# Patient Record
Sex: Female | Born: 1975
Health system: Southern US, Community
[De-identification: ages and names within clinical notes are randomized; demographics above are authoritative.]

## PROBLEM LIST (undated history)

## (undated) DIAGNOSIS — K7460 Unspecified cirrhosis of liver: Secondary | ICD-10-CM

## (undated) DIAGNOSIS — M1990 Unspecified osteoarthritis, unspecified site: Secondary | ICD-10-CM

## (undated) DIAGNOSIS — IMO0001 Reserved for inherently not codable concepts without codable children: Secondary | ICD-10-CM

## (undated) DIAGNOSIS — R748 Abnormal levels of other serum enzymes: Secondary | ICD-10-CM

## (undated) DIAGNOSIS — K219 Gastro-esophageal reflux disease without esophagitis: Secondary | ICD-10-CM

## (undated) DIAGNOSIS — R519 Headache, unspecified: Secondary | ICD-10-CM

## (undated) DIAGNOSIS — D696 Thrombocytopenia, unspecified: Secondary | ICD-10-CM

## (undated) DIAGNOSIS — E119 Type 2 diabetes mellitus without complications: Secondary | ICD-10-CM

## (undated) DIAGNOSIS — I10 Essential (primary) hypertension: Secondary | ICD-10-CM

## (undated) DIAGNOSIS — K760 Fatty (change of) liver, not elsewhere classified: Secondary | ICD-10-CM

## (undated) DIAGNOSIS — G629 Polyneuropathy, unspecified: Secondary | ICD-10-CM

## (undated) DIAGNOSIS — D649 Anemia, unspecified: Secondary | ICD-10-CM

## (undated) DIAGNOSIS — Z8719 Personal history of other diseases of the digestive system: Secondary | ICD-10-CM

## (undated) HISTORY — DX: Polyneuropathy, unspecified: G62.9

## (undated) HISTORY — PX: HERNIA REPAIR: SHX51

## (undated) HISTORY — DX: Unspecified osteoarthritis, unspecified site: M19.90

## (undated) HISTORY — DX: Essential (primary) hypertension: I10

## (undated) HISTORY — DX: Type 2 diabetes mellitus without complications: E11.9

## (undated) HISTORY — DX: Unspecified cirrhosis of liver: K74.60

## (undated) HISTORY — DX: Gastro-esophageal reflux disease without esophagitis: K21.9

## (undated) HISTORY — DX: Anemia, unspecified: D64.9

---
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
## 1997-12-11 ENCOUNTER — Inpatient Hospital Stay (HOSPITAL_COMMUNITY): Admission: AD | Admit: 1997-12-11 | Discharge: 1997-12-11 | Payer: Self-pay | Admitting: *Deleted

## 1997-12-13 ENCOUNTER — Inpatient Hospital Stay (HOSPITAL_COMMUNITY): Admission: AD | Admit: 1997-12-13 | Discharge: 1997-12-15 | Payer: Self-pay | Admitting: *Deleted

## 1998-09-16 ENCOUNTER — Emergency Department (HOSPITAL_COMMUNITY): Admission: EM | Admit: 1998-09-16 | Discharge: 1998-09-16 | Payer: Self-pay | Admitting: Emergency Medicine

## 1999-10-10 ENCOUNTER — Other Ambulatory Visit: Admission: RE | Admit: 1999-10-10 | Discharge: 1999-10-10 | Payer: Self-pay | Admitting: *Deleted

## 2000-04-26 ENCOUNTER — Inpatient Hospital Stay (HOSPITAL_COMMUNITY): Admission: AD | Admit: 2000-04-26 | Discharge: 2000-04-26 | Payer: Self-pay | Admitting: Obstetrics & Gynecology

## 2000-07-11 ENCOUNTER — Inpatient Hospital Stay (HOSPITAL_COMMUNITY): Admission: AD | Admit: 2000-07-11 | Discharge: 2000-07-11 | Payer: Self-pay | Admitting: *Deleted

## 2000-07-22 ENCOUNTER — Inpatient Hospital Stay (HOSPITAL_COMMUNITY): Admission: AD | Admit: 2000-07-22 | Discharge: 2000-07-25 | Payer: Self-pay | Admitting: Obstetrics and Gynecology

## 2000-07-22 ENCOUNTER — Encounter (INDEPENDENT_AMBULATORY_CARE_PROVIDER_SITE_OTHER): Payer: Self-pay | Admitting: Specialist

## 2000-07-25 HISTORY — PX: TUBAL LIGATION: SHX77

## 2000-08-26 ENCOUNTER — Other Ambulatory Visit: Admission: RE | Admit: 2000-08-26 | Discharge: 2000-08-26 | Payer: Self-pay | Admitting: *Deleted

## 2001-10-18 ENCOUNTER — Emergency Department (HOSPITAL_COMMUNITY): Admission: EM | Admit: 2001-10-18 | Discharge: 2001-10-18 | Payer: Self-pay | Admitting: *Deleted

## 2002-04-16 ENCOUNTER — Emergency Department (HOSPITAL_COMMUNITY): Admission: EM | Admit: 2002-04-16 | Discharge: 2002-04-16 | Payer: Self-pay

## 2003-05-20 ENCOUNTER — Encounter: Payer: Self-pay | Admitting: Emergency Medicine

## 2003-05-20 ENCOUNTER — Emergency Department (HOSPITAL_COMMUNITY): Admission: EM | Admit: 2003-05-20 | Discharge: 2003-05-20 | Payer: Self-pay | Admitting: Emergency Medicine

## 2003-07-20 ENCOUNTER — Other Ambulatory Visit: Admission: RE | Admit: 2003-07-20 | Discharge: 2003-07-20 | Payer: Self-pay | Admitting: Gynecology

## 2003-10-20 ENCOUNTER — Emergency Department (HOSPITAL_COMMUNITY): Admission: EM | Admit: 2003-10-20 | Discharge: 2003-10-21 | Payer: Self-pay | Admitting: Emergency Medicine

## 2003-11-24 ENCOUNTER — Emergency Department (HOSPITAL_COMMUNITY): Admission: EM | Admit: 2003-11-24 | Discharge: 2003-11-24 | Payer: Self-pay | Admitting: Emergency Medicine

## 2004-12-06 ENCOUNTER — Ambulatory Visit (HOSPITAL_COMMUNITY): Admission: RE | Admit: 2004-12-06 | Discharge: 2004-12-06 | Payer: Self-pay | Admitting: Internal Medicine

## 2005-01-23 IMAGING — CR DG CHEST 2V
2 series · 2 of 2 positions shown · non-contrast
Comparison: none

CLINICAL DATA: Cough.  Chest pain.  
 PA AND LATERAL CHEST:

[w chest pa]
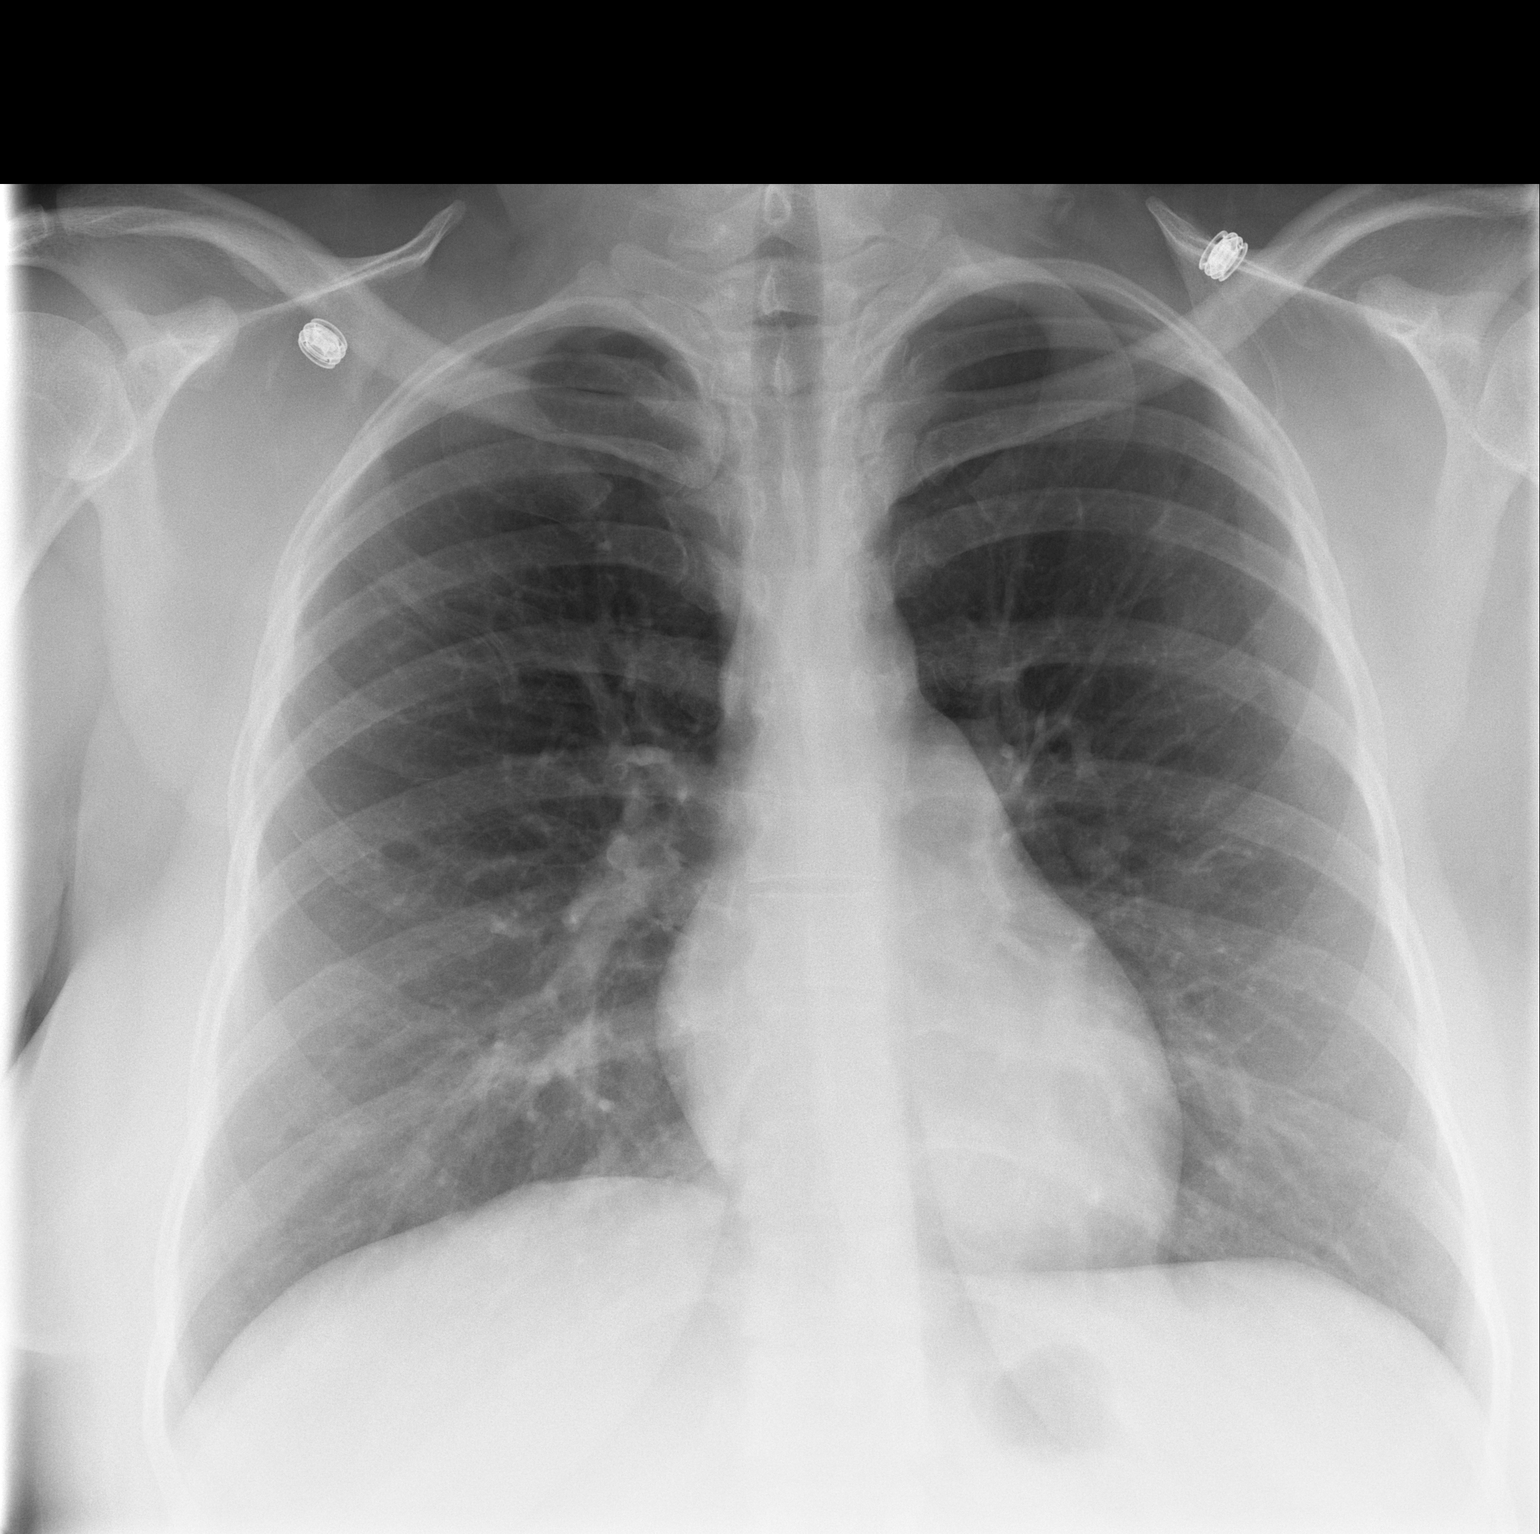

[w chest lat]
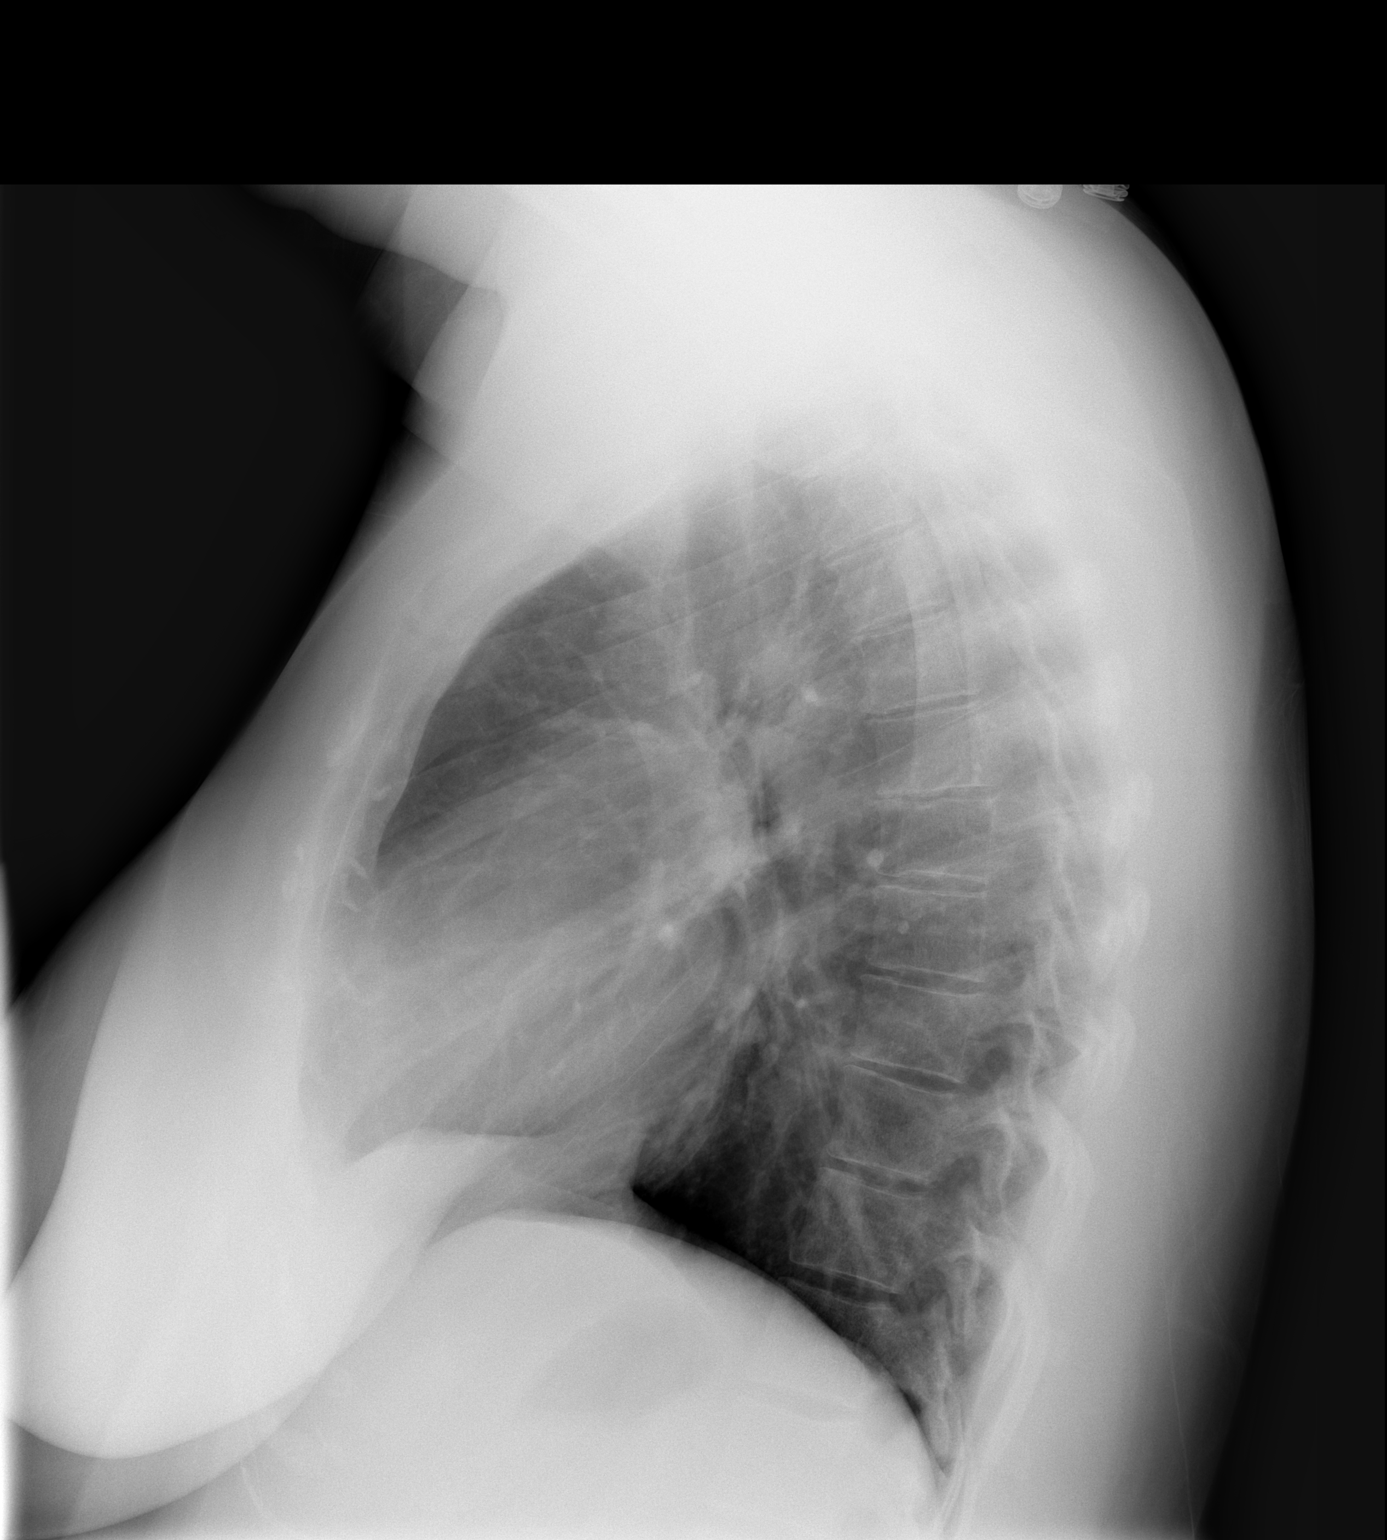

[2 of 2 positions shown; findings below may reference images not displayed]

FINDINGS: Lungs are clear.  No pleural effusion.  Heart size is normal.
IMPRESSION: No acute disease.

## 2005-03-05 ENCOUNTER — Emergency Department (HOSPITAL_COMMUNITY): Admission: EM | Admit: 2005-03-05 | Discharge: 2005-03-05 | Payer: Self-pay | Admitting: Emergency Medicine

## 2006-04-30 ENCOUNTER — Emergency Department (HOSPITAL_COMMUNITY): Admission: EM | Admit: 2006-04-30 | Discharge: 2006-04-30 | Payer: Self-pay | Admitting: Emergency Medicine

## 2006-08-12 ENCOUNTER — Emergency Department (HOSPITAL_COMMUNITY): Admission: EM | Admit: 2006-08-12 | Discharge: 2006-08-13 | Payer: Self-pay | Admitting: Emergency Medicine

## 2006-08-12 IMAGING — US US ABDOMEN COMPLETE
1 series · 14 of 25 positions shown · non-contrast
Comparison: None.

ABDOMEN ULTRASOUND:

CLINICAL DATA: Pain
TECHNIQUE: Complete abdominal ultrasound examination was performed including
evaluation of the liver, gallbladder, bile ducts, pancreas, kidneys, spleen,
IVC, and abdominal aorta.

[Series 1: unknown · 0.35mm/px · 14 of 63 slices shown]
[im 1/63]
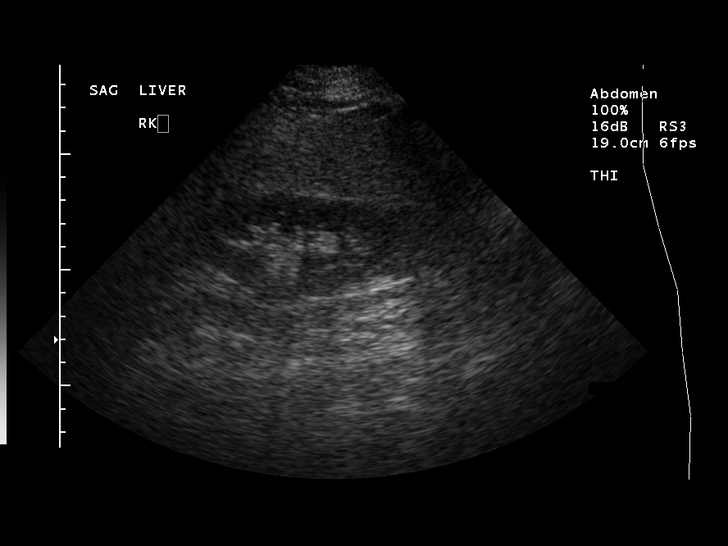
[im 6/63]
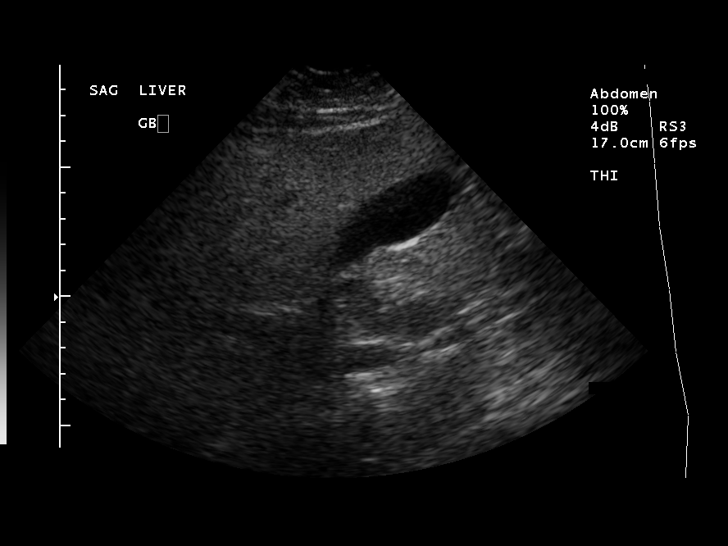
[im 11/63]
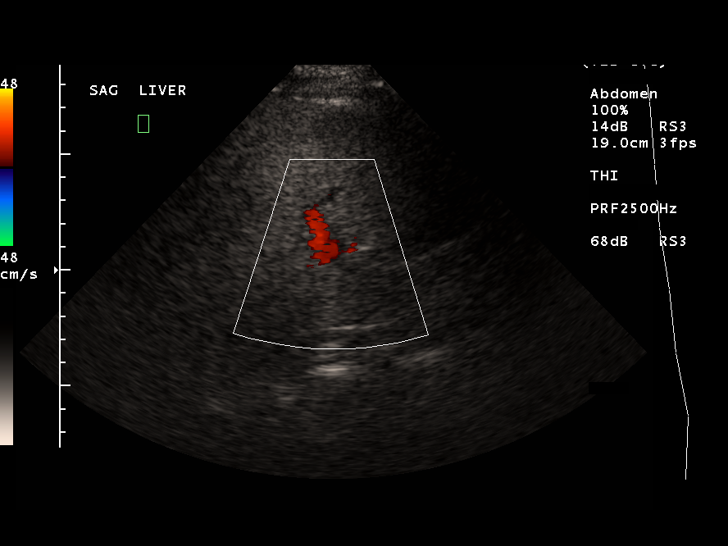
[im 16/63]
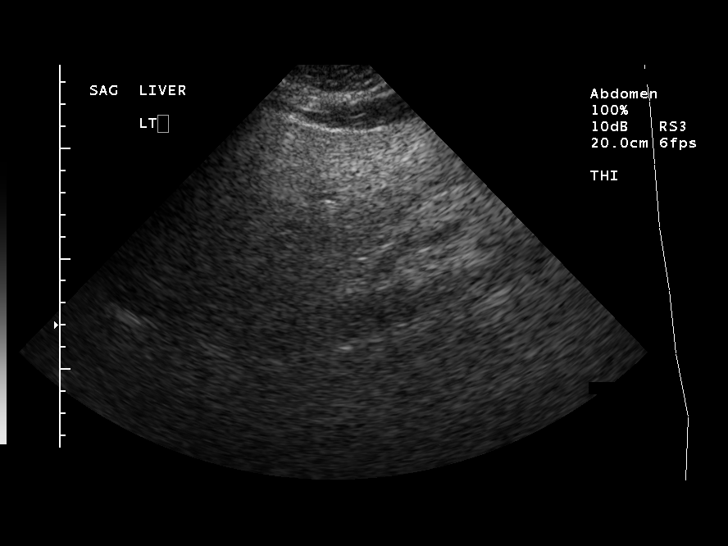
[im 21/63]
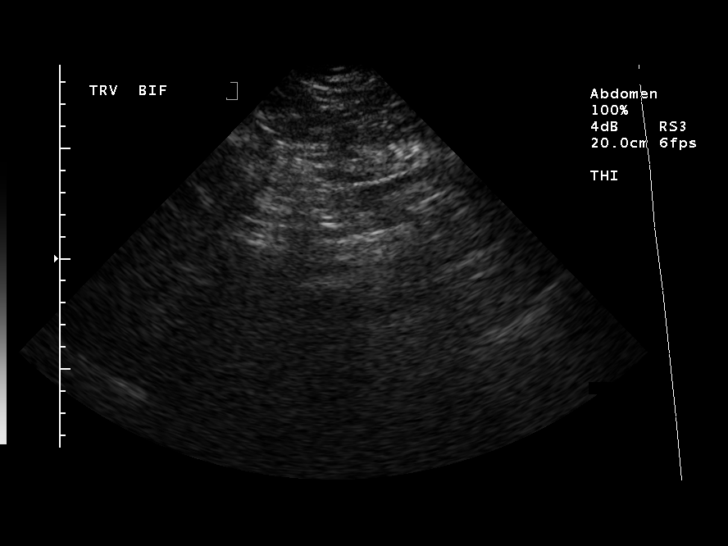
[im 24/63]
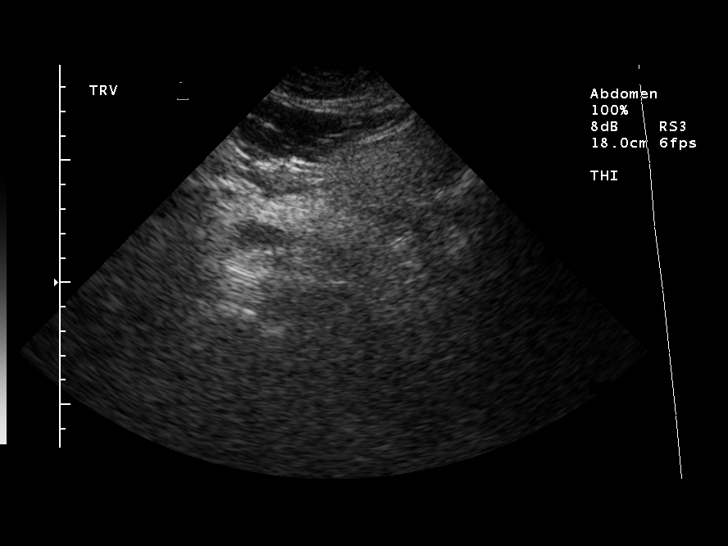
[im 29/63]
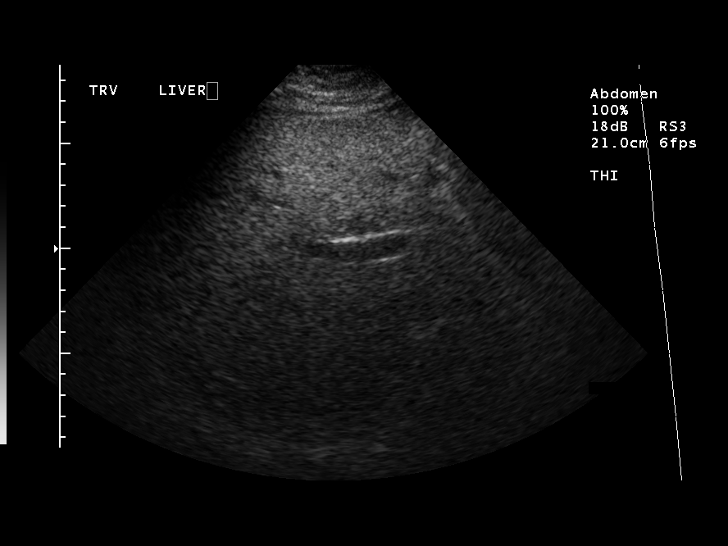
[im 34/63]
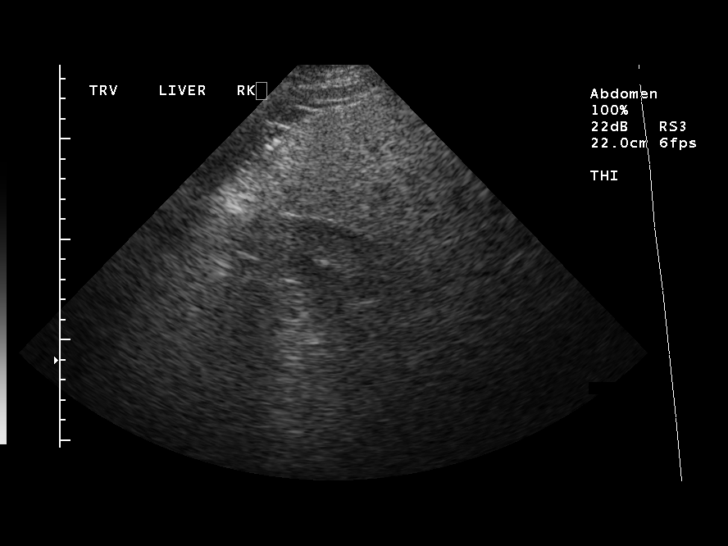
[im 39/63]
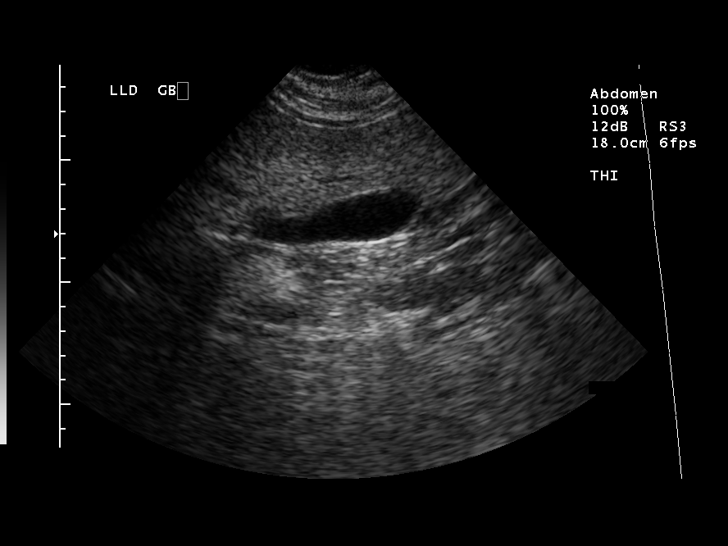
[im 42/63]
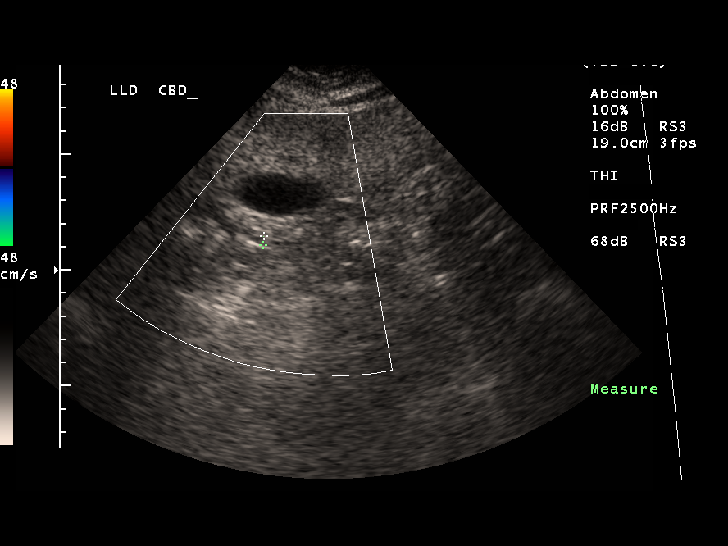
[im 47/63]
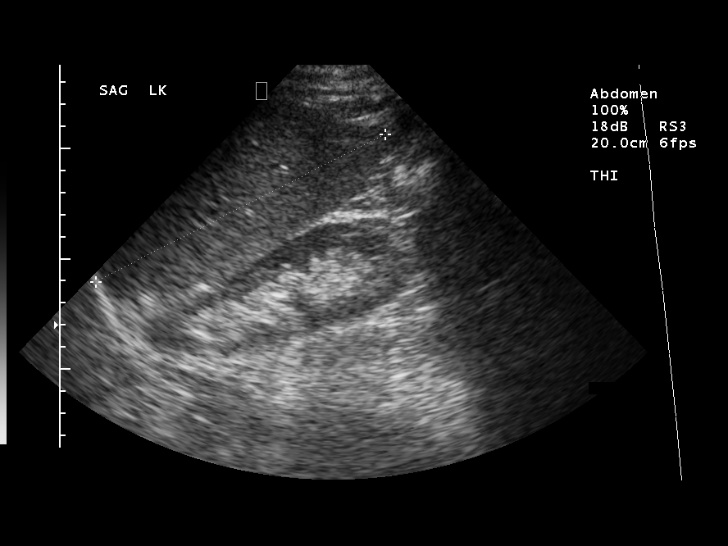
[im 52/63]
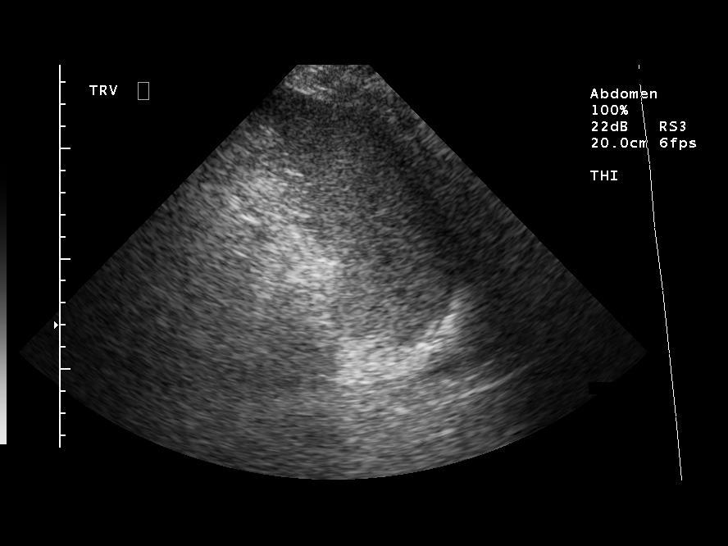
[im 57/63]
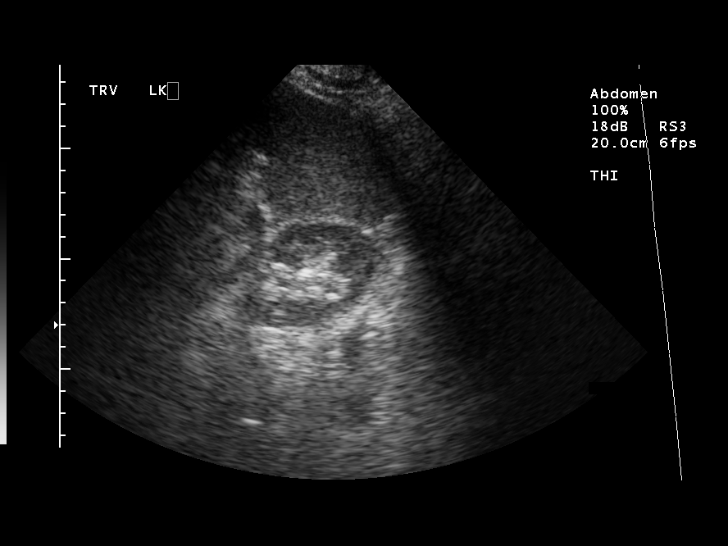
[im 63/63]
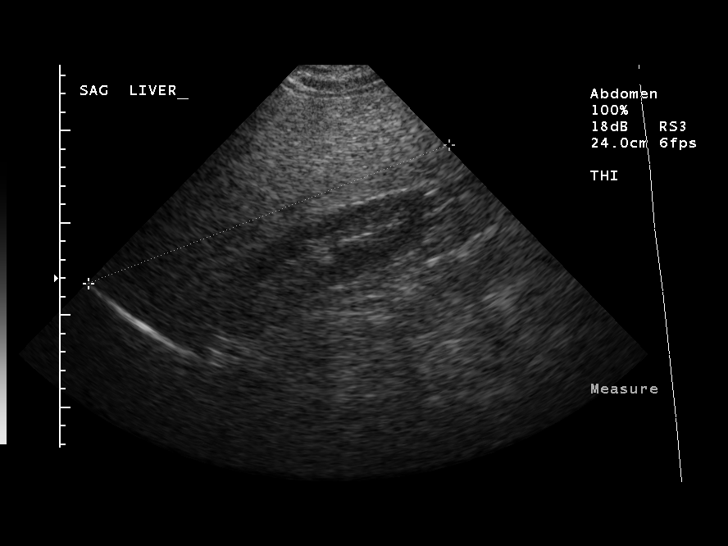

[14 of 25 positions shown; findings below may reference images not displayed]

FINDINGS: Gallbladder: Normal. Specifically, there is no evidence for gallstones,
gallbladder wall thickening or pericholecystic fluid.

Common Bile Duct:  Nondilated

Liver:  Diffusely coarsened echotexture consistent with fatty infiltration. The
liver measures greater than 21 cm in craniocaudal length.

Inferior Vena Cava:  Normal

Pancreas:  Not well seen secondary to overlying bowel gas

Spleen:  Normal

Right Kidney:  Normal

Left Kidney:  Normal

Aorta:  No aneurysm
IMPRESSION: Enlarged, fatty liver. Otherwise unremarkable exam.

## 2007-03-03 ENCOUNTER — Emergency Department (HOSPITAL_COMMUNITY): Admission: EM | Admit: 2007-03-03 | Discharge: 2007-03-04 | Payer: Self-pay | Admitting: Emergency Medicine

## 2007-03-03 IMAGING — CR DG WRIST COMPLETE 3+V*L*
4 series · 4 of 4 positions shown · non-contrast
Comparison: none

CLINICAL DATA: Fall, wrist pain

LEFT WRIST - 4  VIEW:

[x wrist pa left]
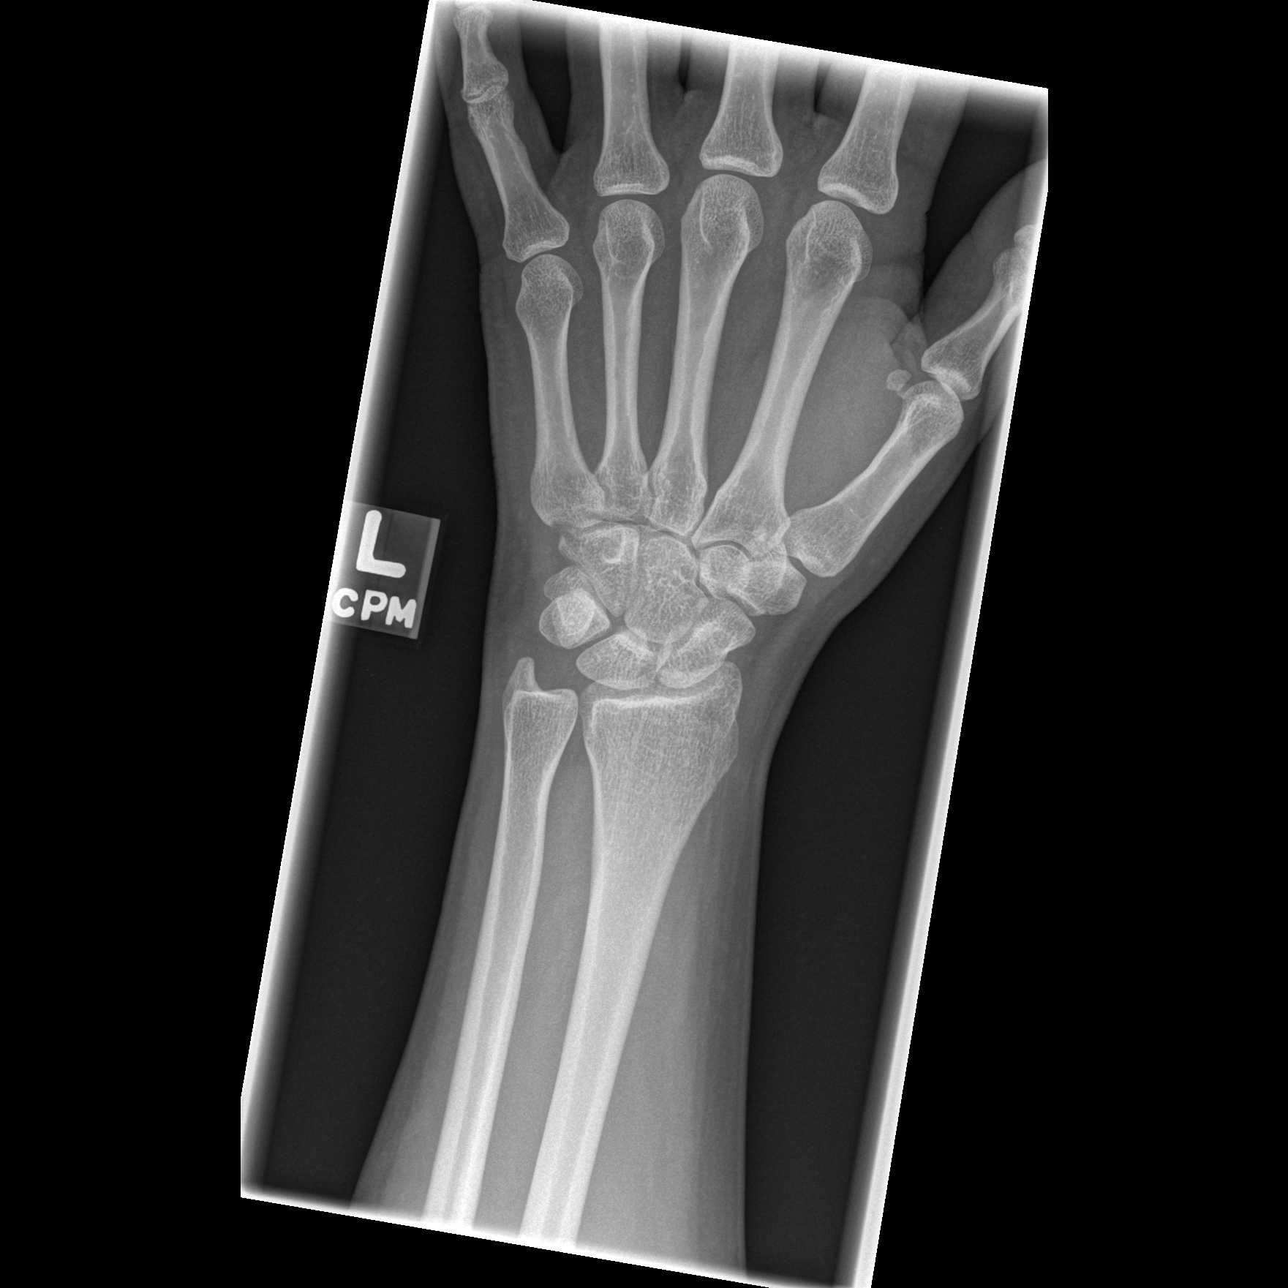

[x wrist obl left]
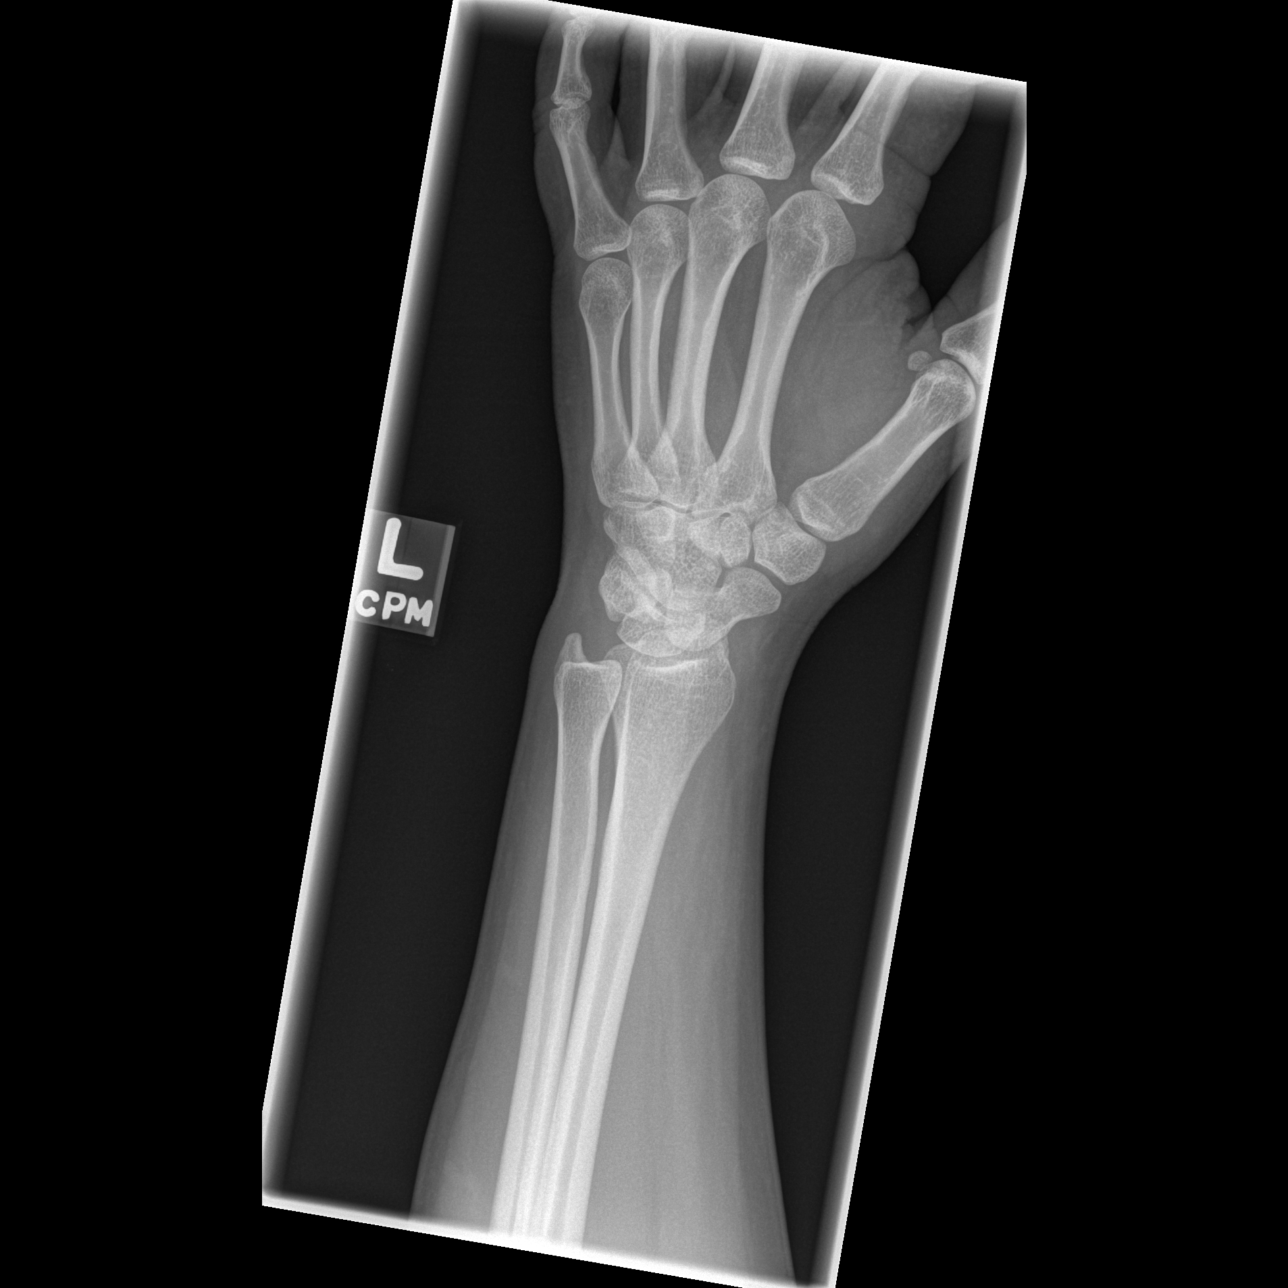

[x wrist lat left]
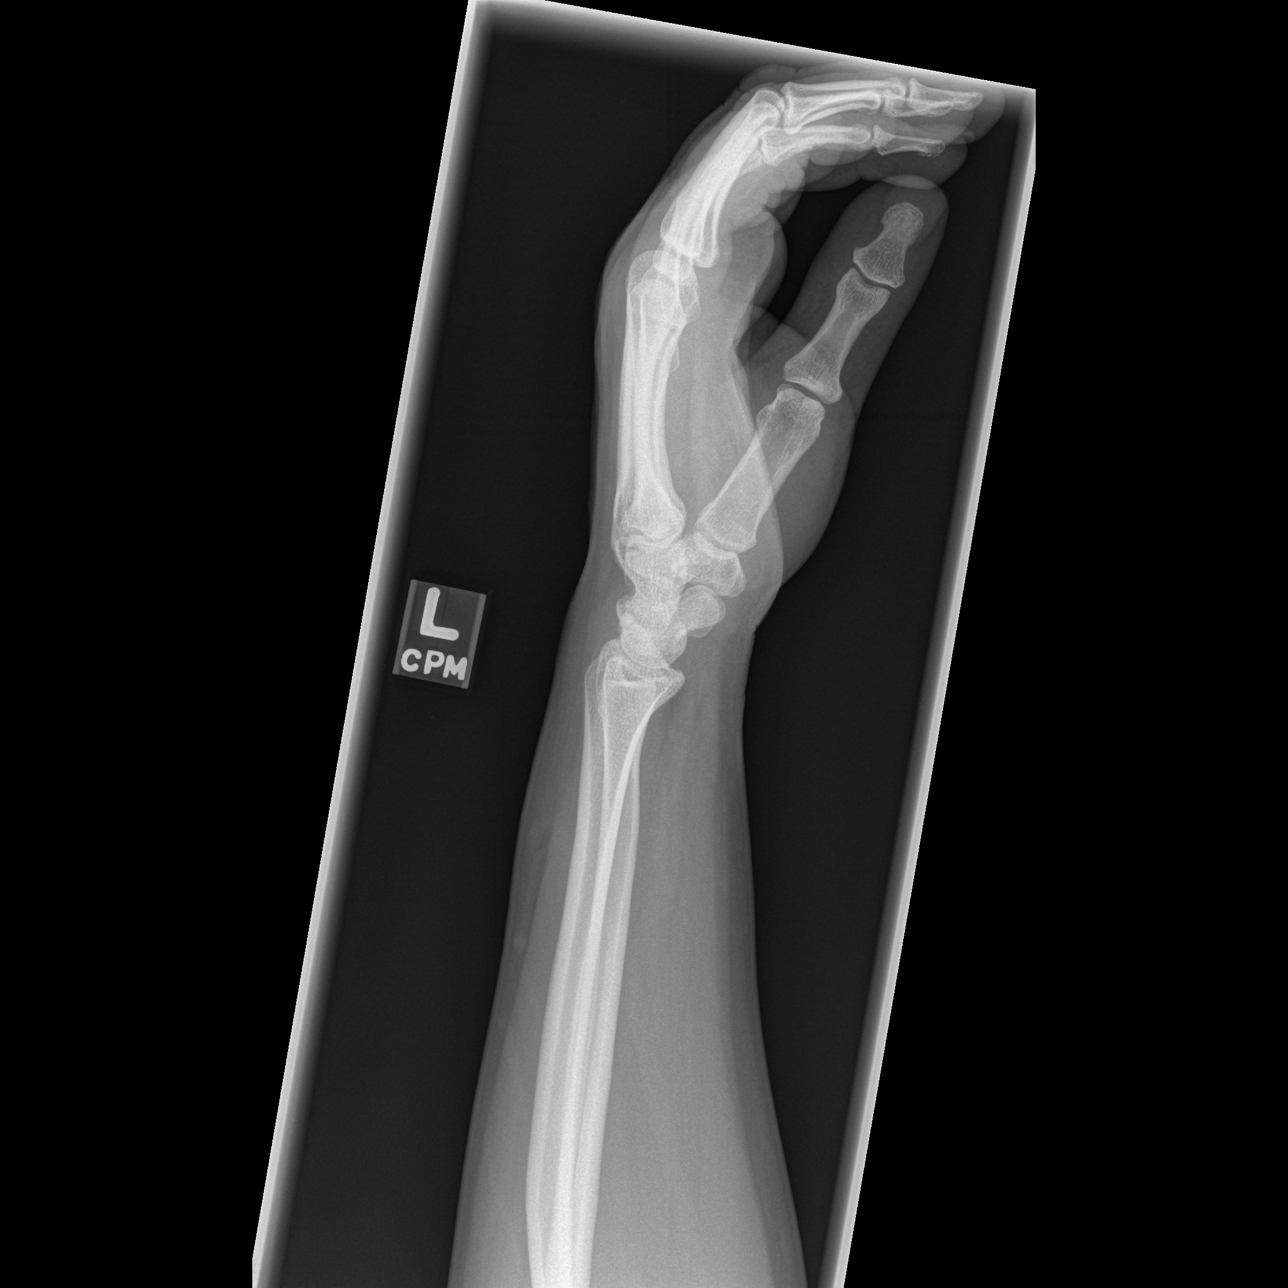

[x wrist navicular]
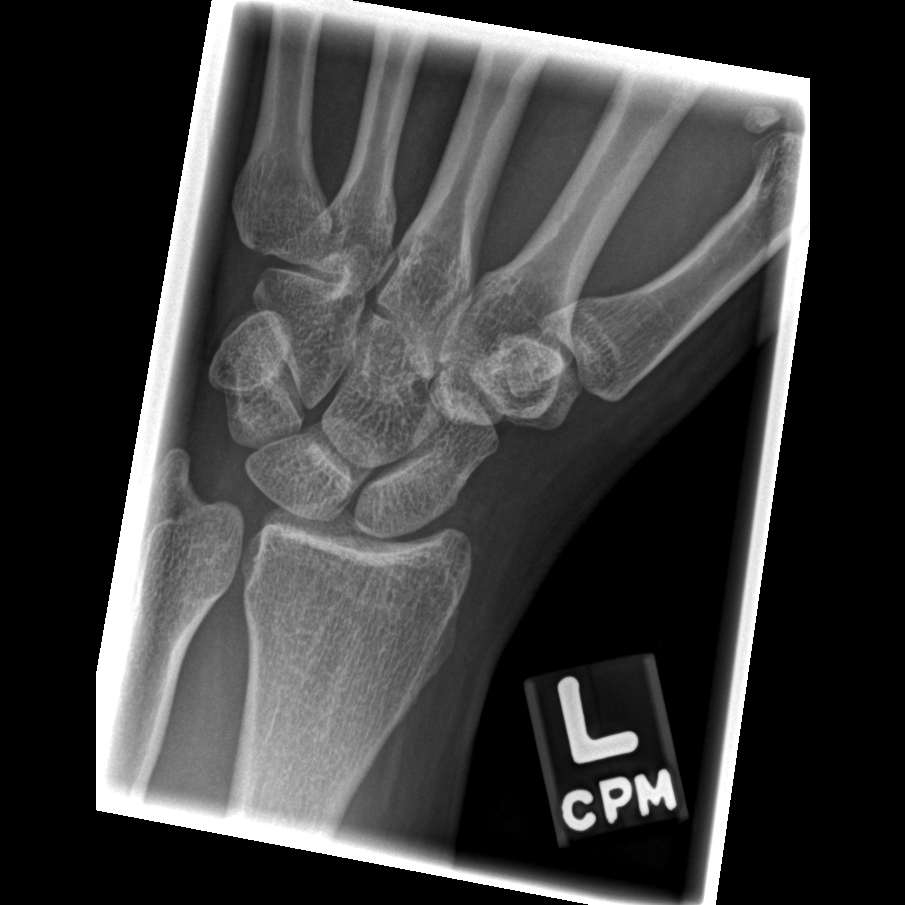

[4 of 4 positions shown; findings below may reference images not displayed]

FINDINGS: There is no evidence of fracture or dislocation.  There is no
evidence of arthropathy or other focal bone abnormality.  Soft tissues are
unremarkable.
IMPRESSION: Negative.

## 2007-03-03 IMAGING — CR DG CHEST 2V
2 series · 2 of 2 positions shown · non-contrast
Comparison: [DATE]

CLINICAL DATA: Fall, chest pain

CHEST - 2 VIEW:

[w chest pa]
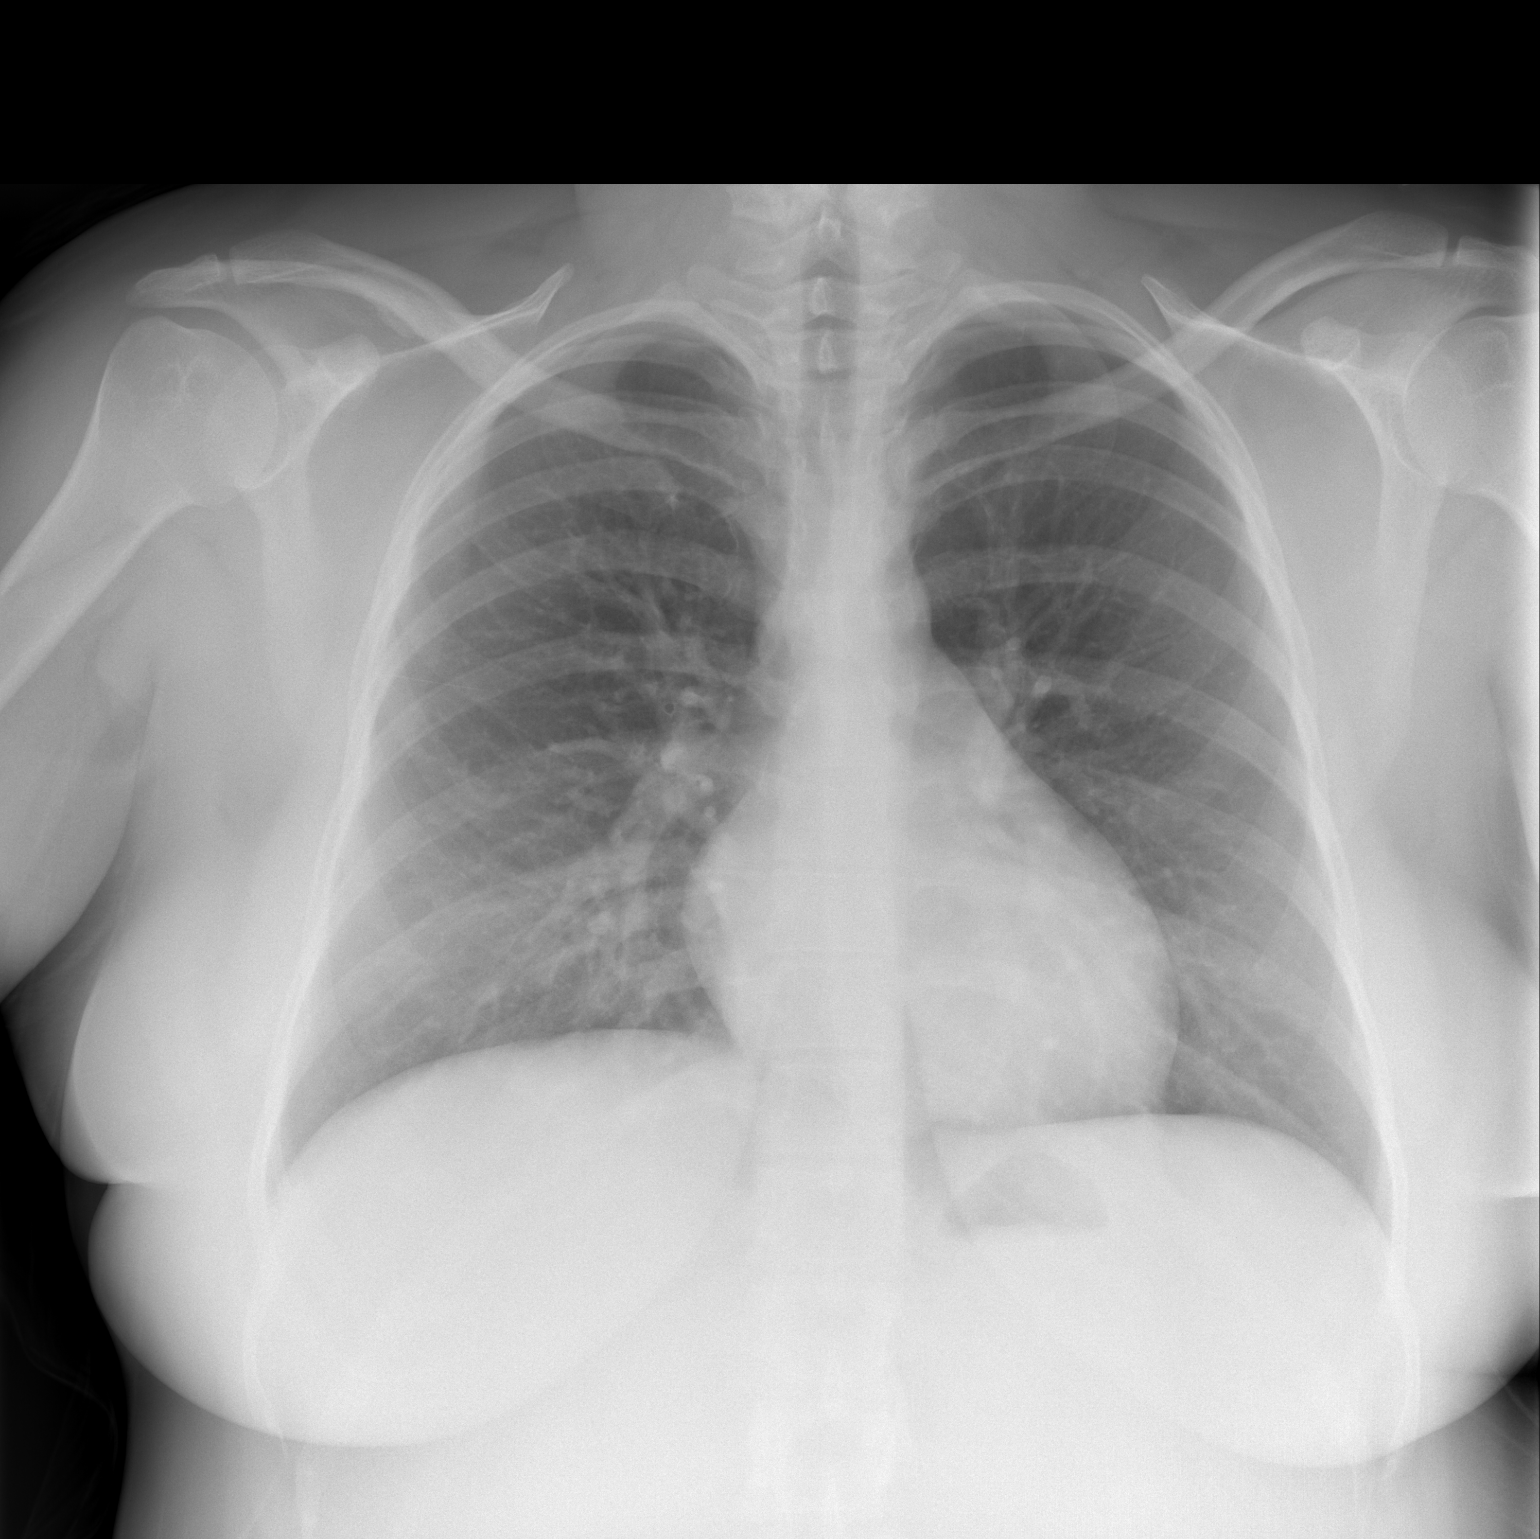

[w chest lat]
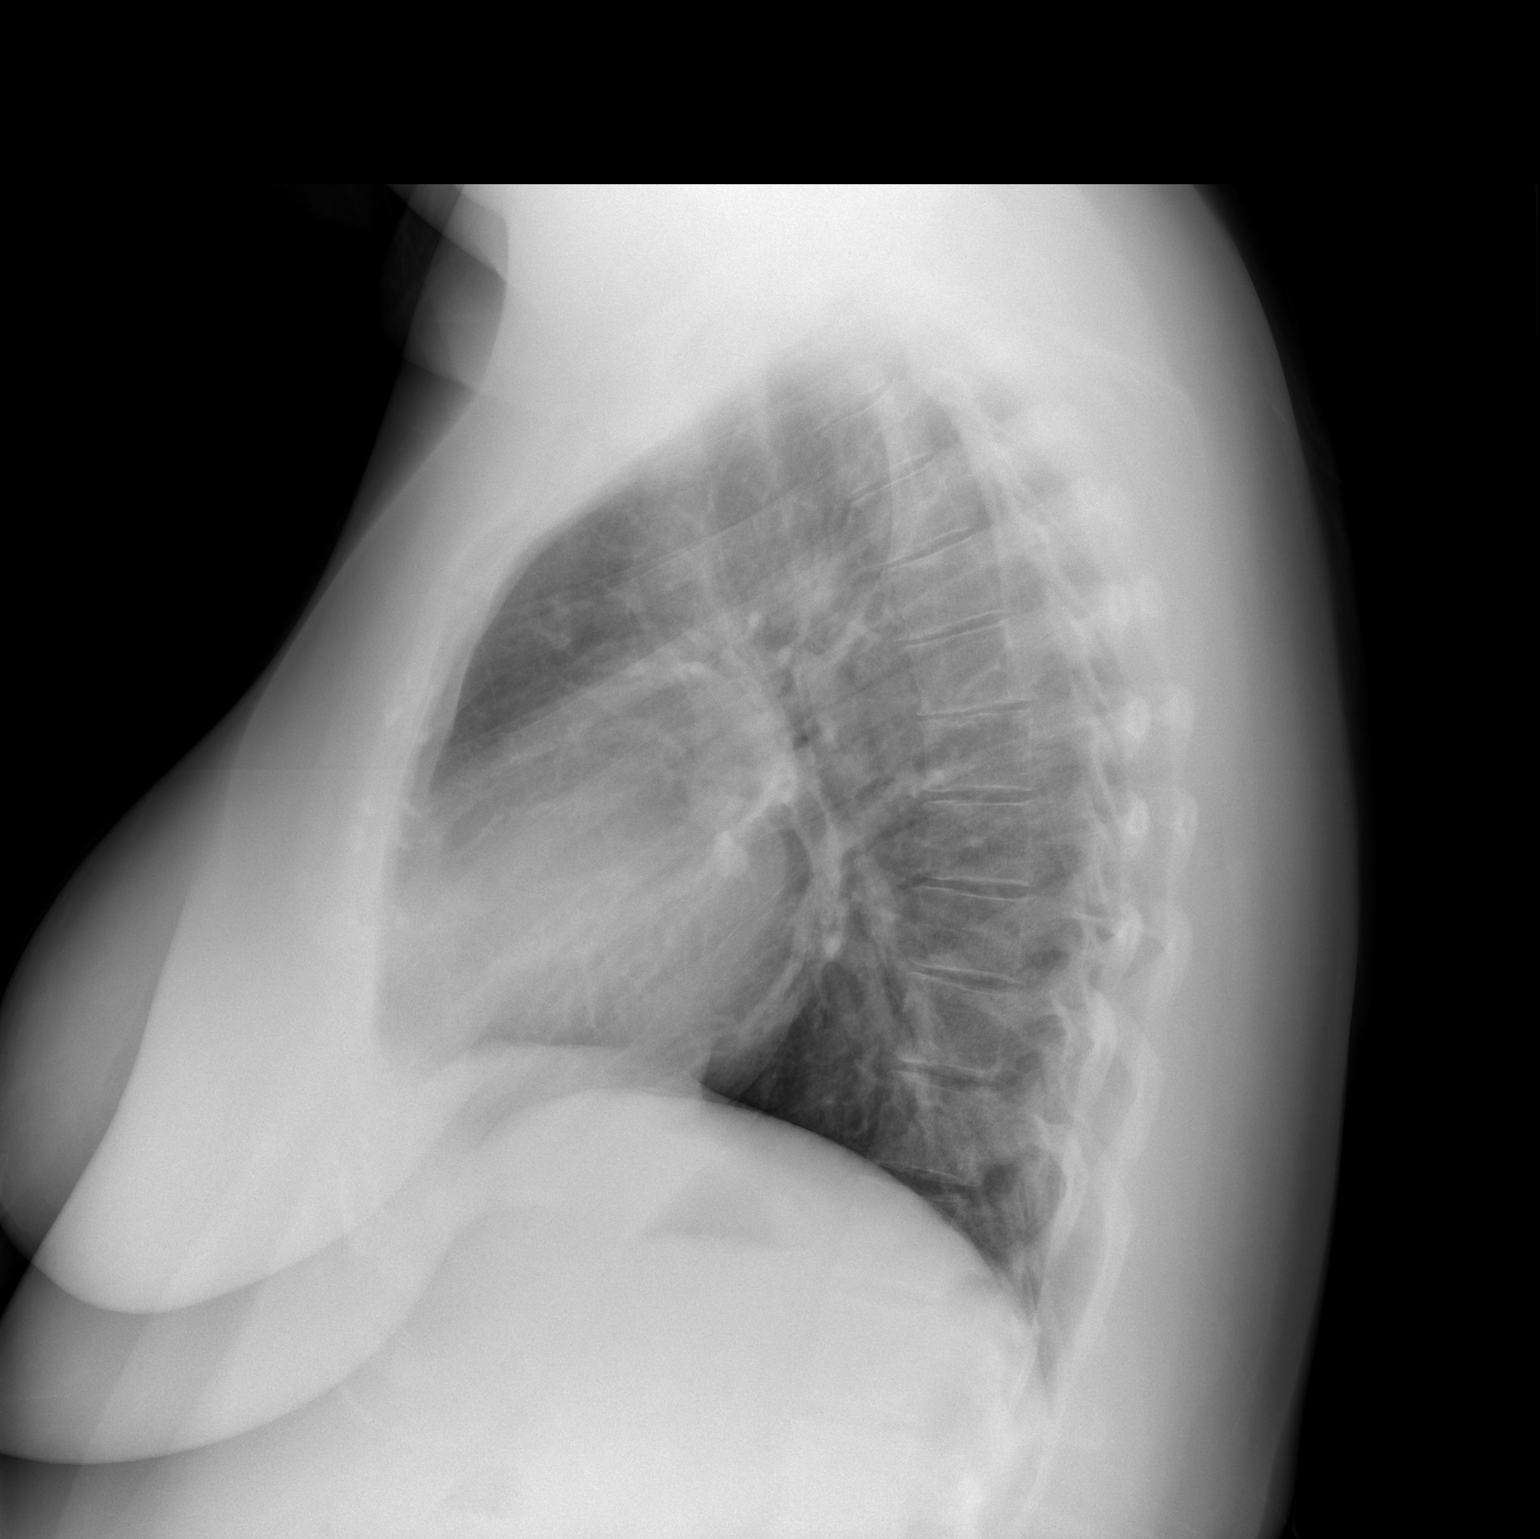

[2 of 2 positions shown; findings below may reference images not displayed]

FINDINGS: The heart size and mediastinal contours are within normal limits. 
Mild peribronchial thickening noted. No focal opacities. The visualized skeletal
structures are unremarkable.
IMPRESSION: Mild bronchitic changes.

## 2007-03-04 IMAGING — CR DG CERVICAL SPINE COMPLETE 4+V
6 series · 6 of 6 positions shown · non-contrast
Comparison: none

CLINICAL DATA: Fall, neck pain

CERVICAL SPINE - 5  VIEW:

[w c-spine lat *]
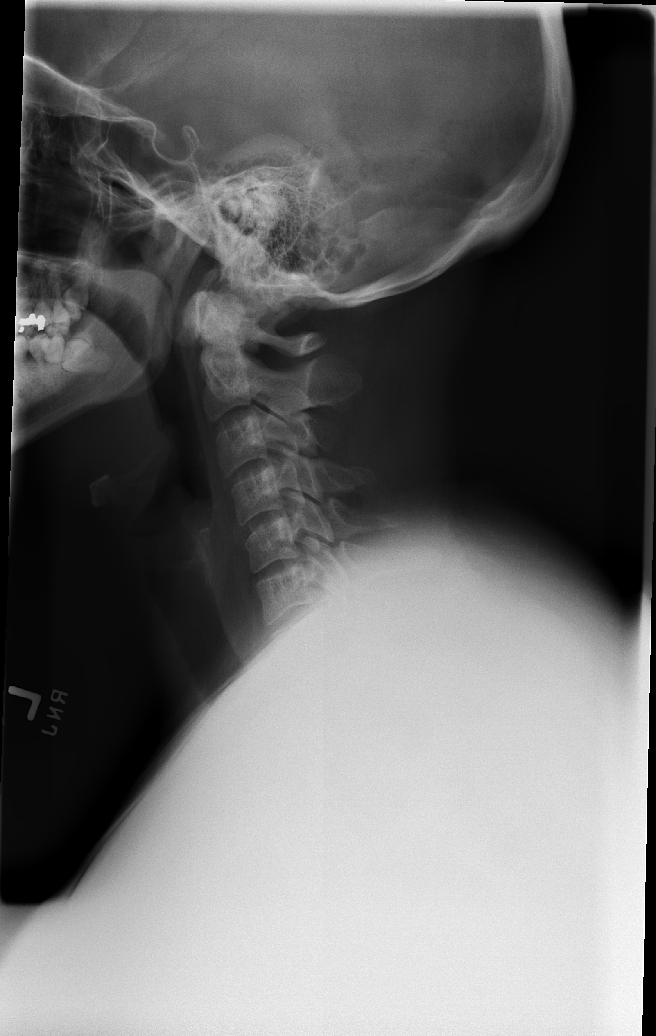

[w swimmers view *]
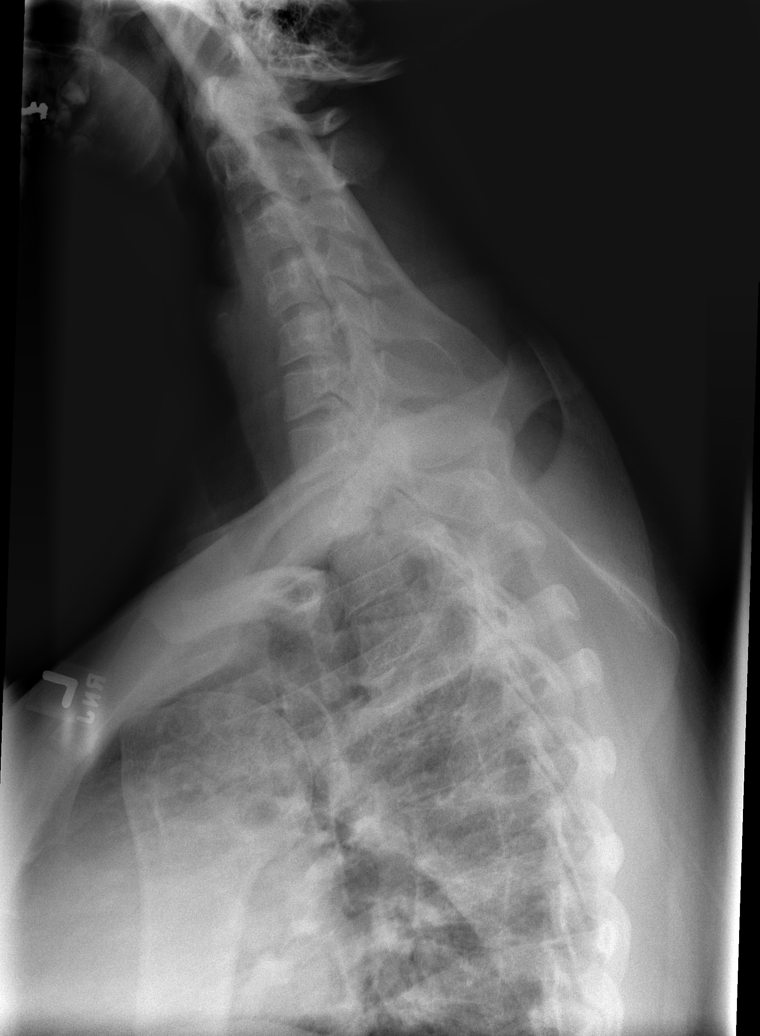

[w c-spine oblique *]
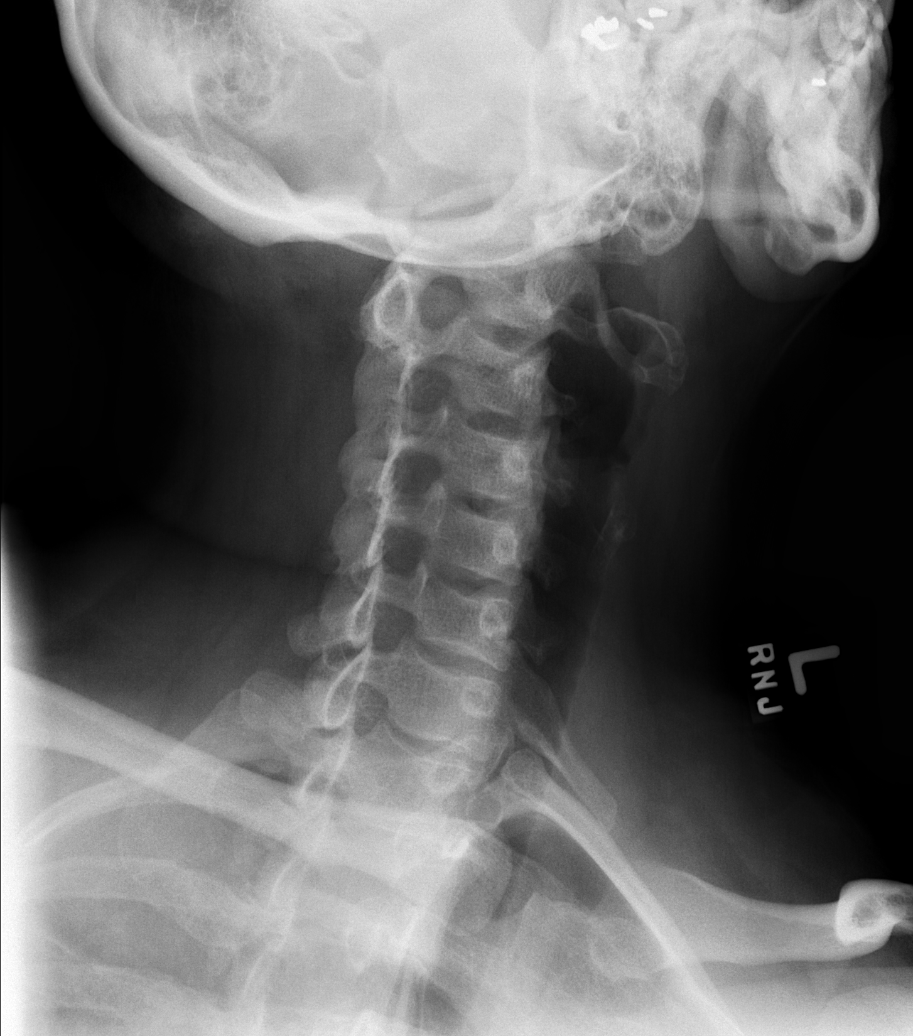

[w c-spine oblique]
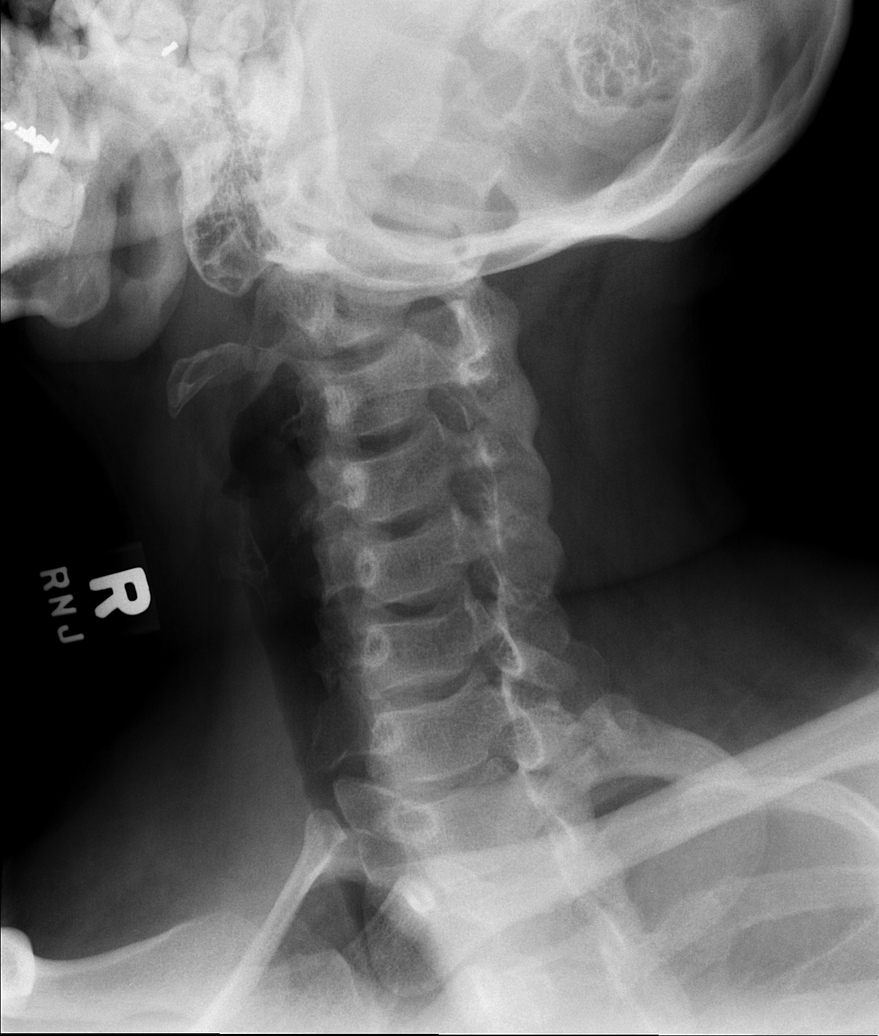

[w c-spine a.p. *]
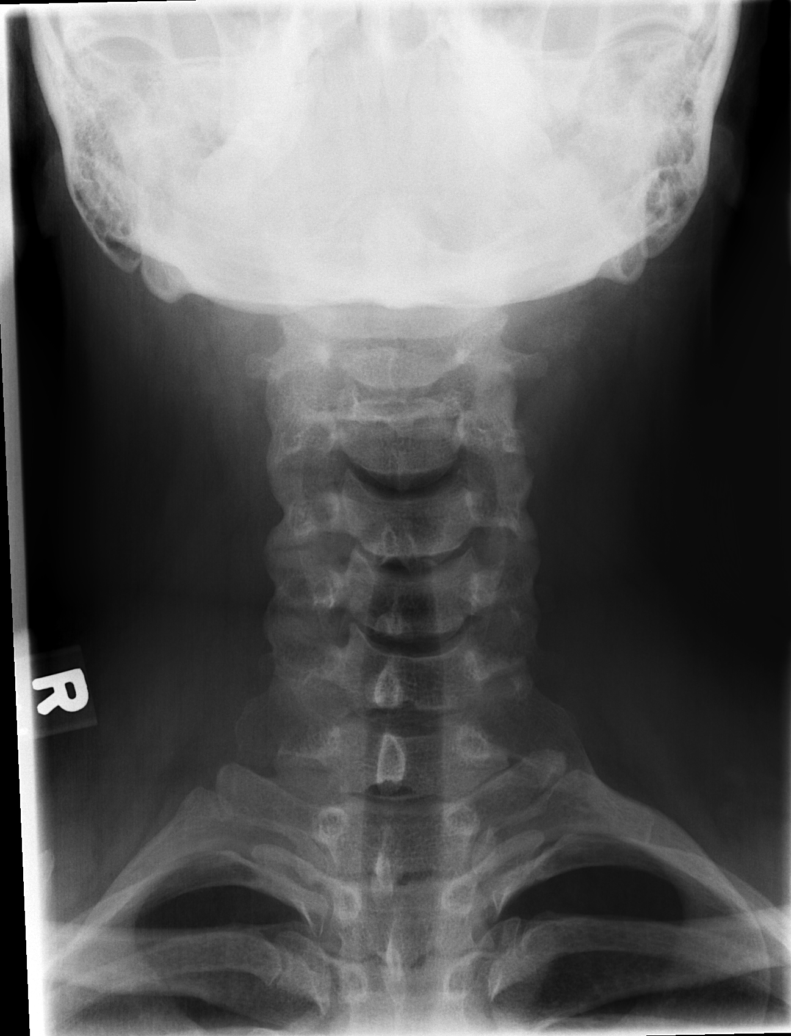

[w c-spine odontoid *]
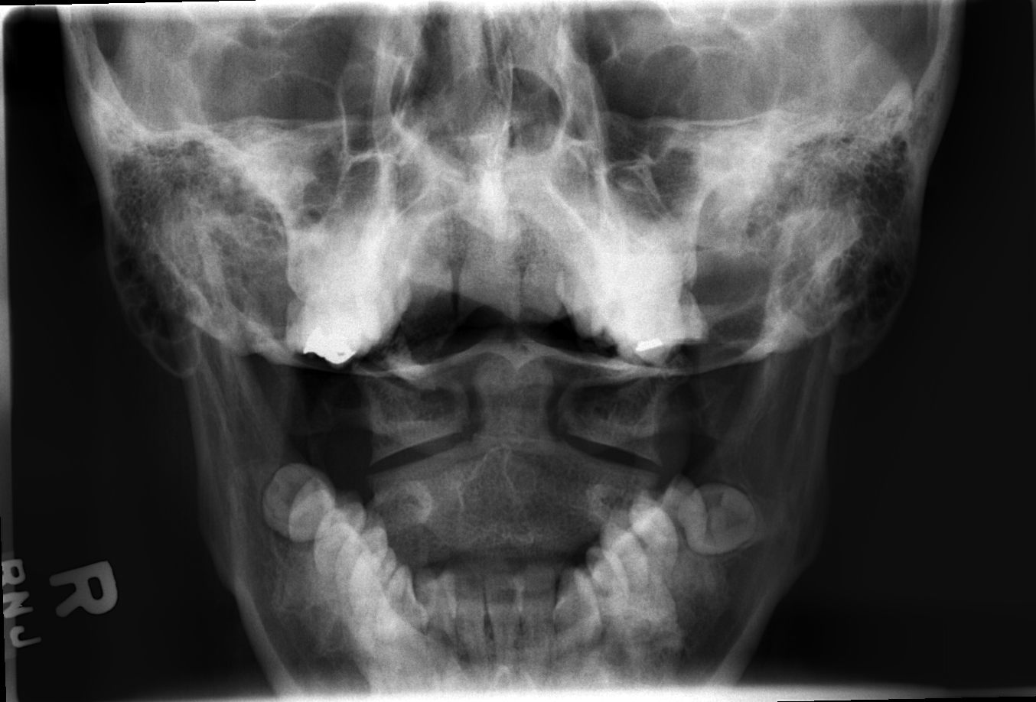

[6 of 6 positions shown; findings below may reference images not displayed]

FINDINGS: There is no evidence of cervical spine fracture or prevertebral soft
tissue swelling.  Alignment is normal.  No other significant bone abnormalities
are identified.
IMPRESSION: Negative cervical spine radiographs.

## 2007-04-26 ENCOUNTER — Emergency Department (HOSPITAL_COMMUNITY): Admission: EM | Admit: 2007-04-26 | Discharge: 2007-04-26 | Payer: Self-pay | Admitting: Emergency Medicine

## 2007-04-26 IMAGING — CR DG WRIST COMPLETE 3+V*R*
4 series · 4 of 4 positions shown · non-contrast
Comparison: none

CLINICAL DATA: The patient fell with her hand extended and now has wrist pain along the radial aspect.  
 RIGHT WRIST ? 4 VIEW:

[x wrist pa right]
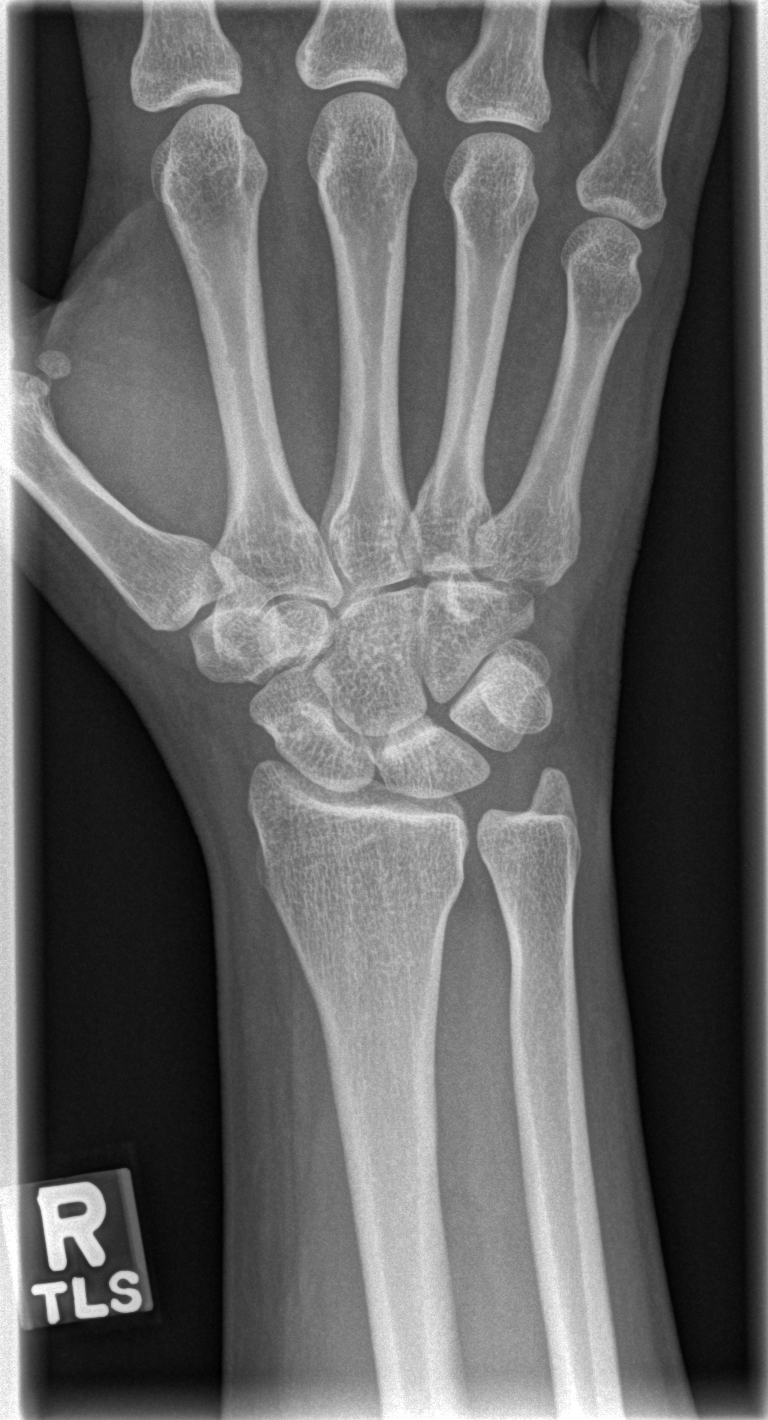

[x wrist obl right]
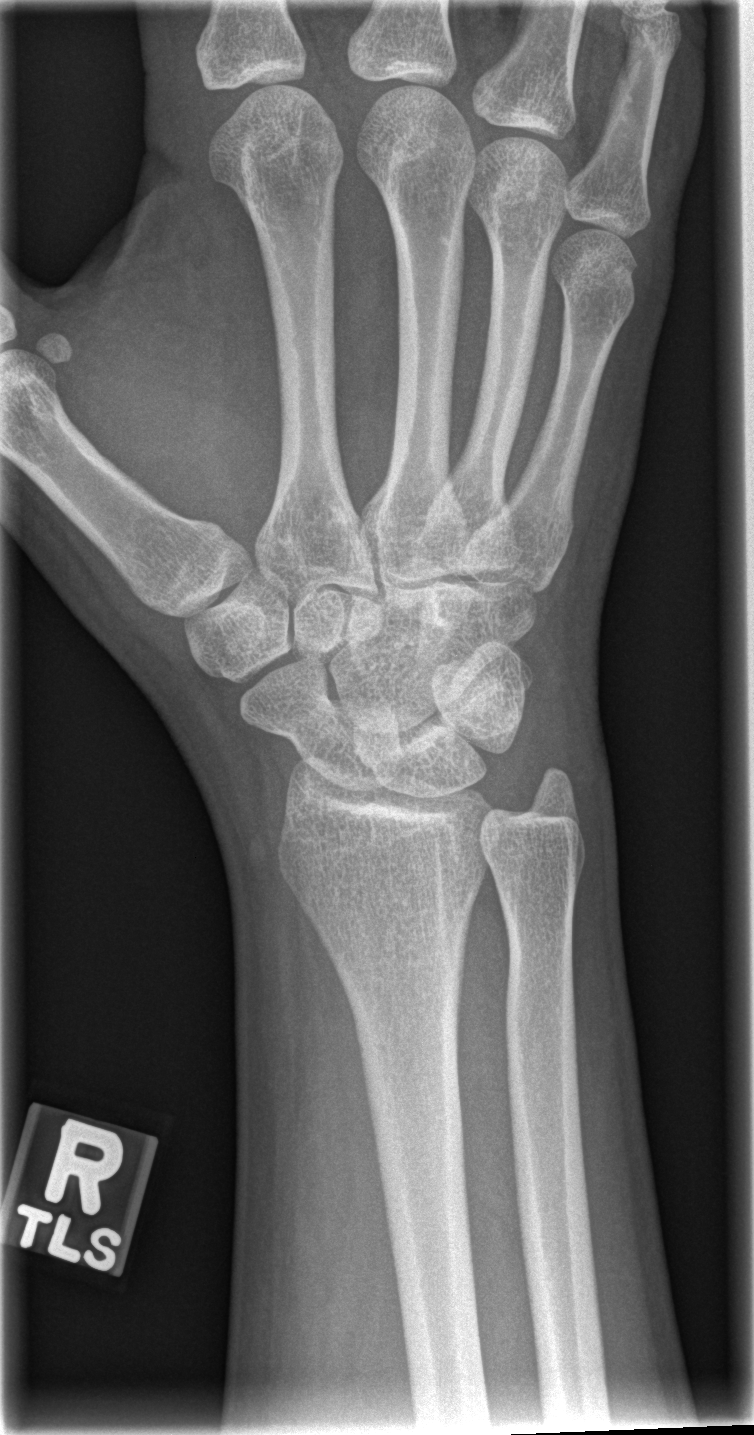

[x wrist lat right]
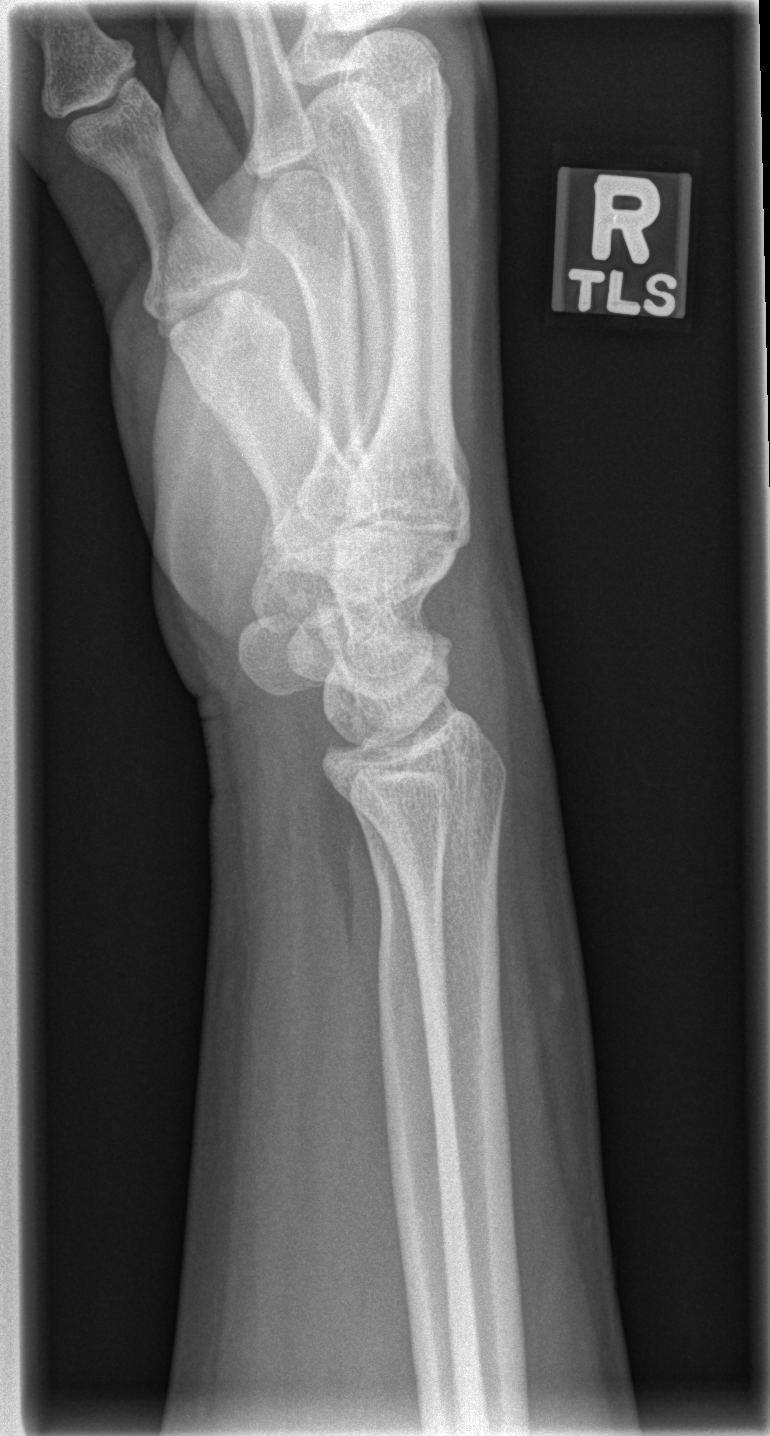

[x wrist navicular]
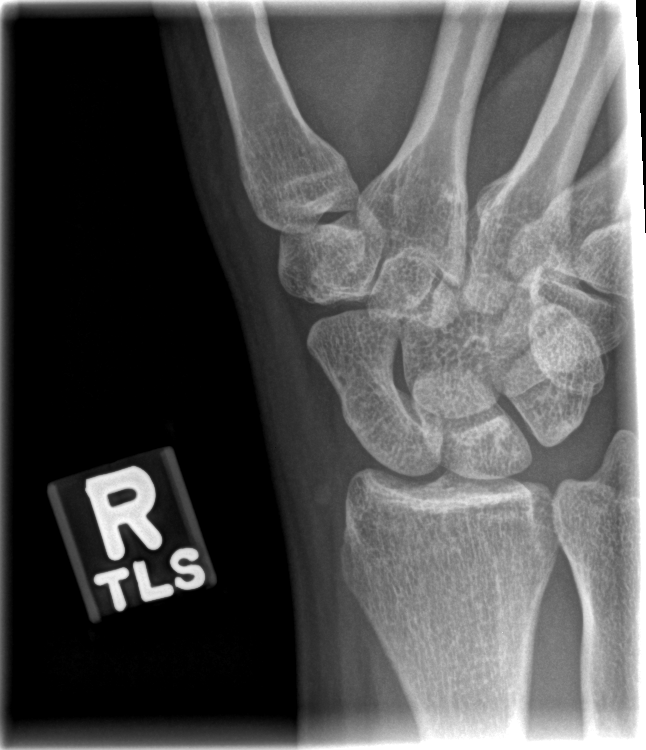

[4 of 4 positions shown; findings below may reference images not displayed]

FINDINGS: There is no evidence of fracture or dislocation.  There is no evidence of arthropathy or other focal bone abnormality.  Soft tissues are unremarkable.
IMPRESSION: Negative.

## 2007-06-20 ENCOUNTER — Emergency Department (HOSPITAL_COMMUNITY): Admission: EM | Admit: 2007-06-20 | Discharge: 2007-06-20 | Payer: Self-pay | Admitting: Emergency Medicine

## 2008-02-27 ENCOUNTER — Emergency Department (HOSPITAL_COMMUNITY): Admission: EM | Admit: 2008-02-27 | Discharge: 2008-02-28 | Payer: Self-pay | Admitting: Emergency Medicine

## 2008-02-27 IMAGING — CR DG FOOT COMPLETE 3+V*R*
3 series · 3 of 3 positions shown · non-contrast
Comparison: None.

CLINICAL DATA: Injured right foot.

RIGHT FOOT COMPLETE - 3+ VIEW [DATE]:

[t foot ap right]
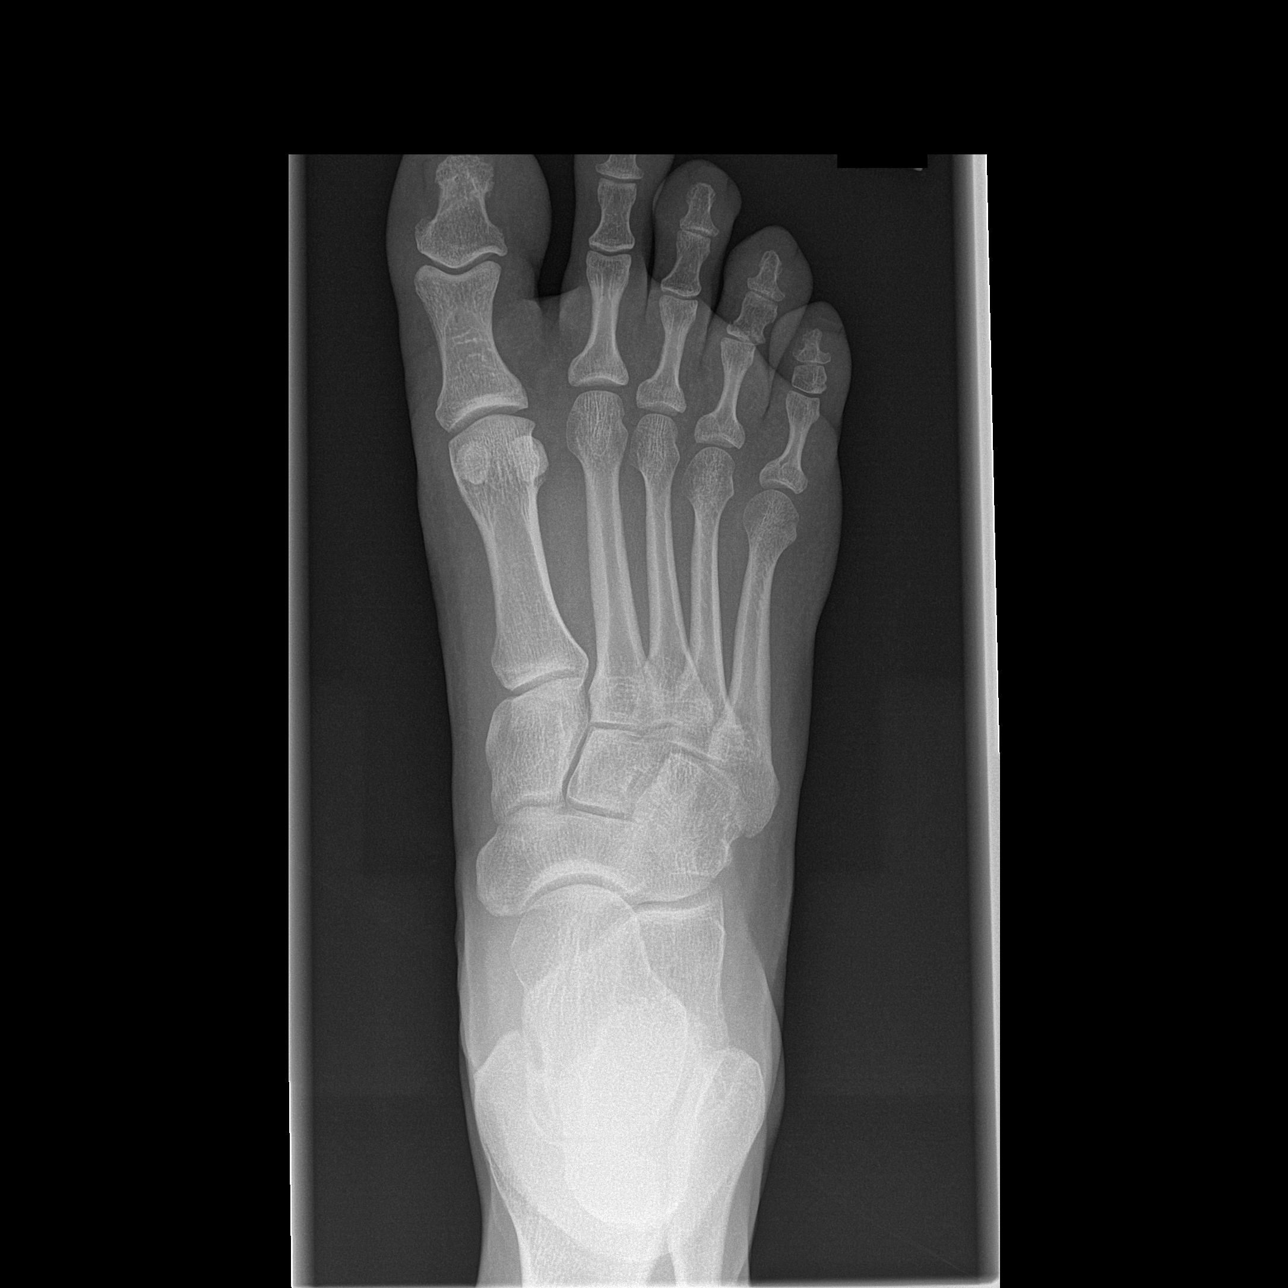

[t foot oblique right]
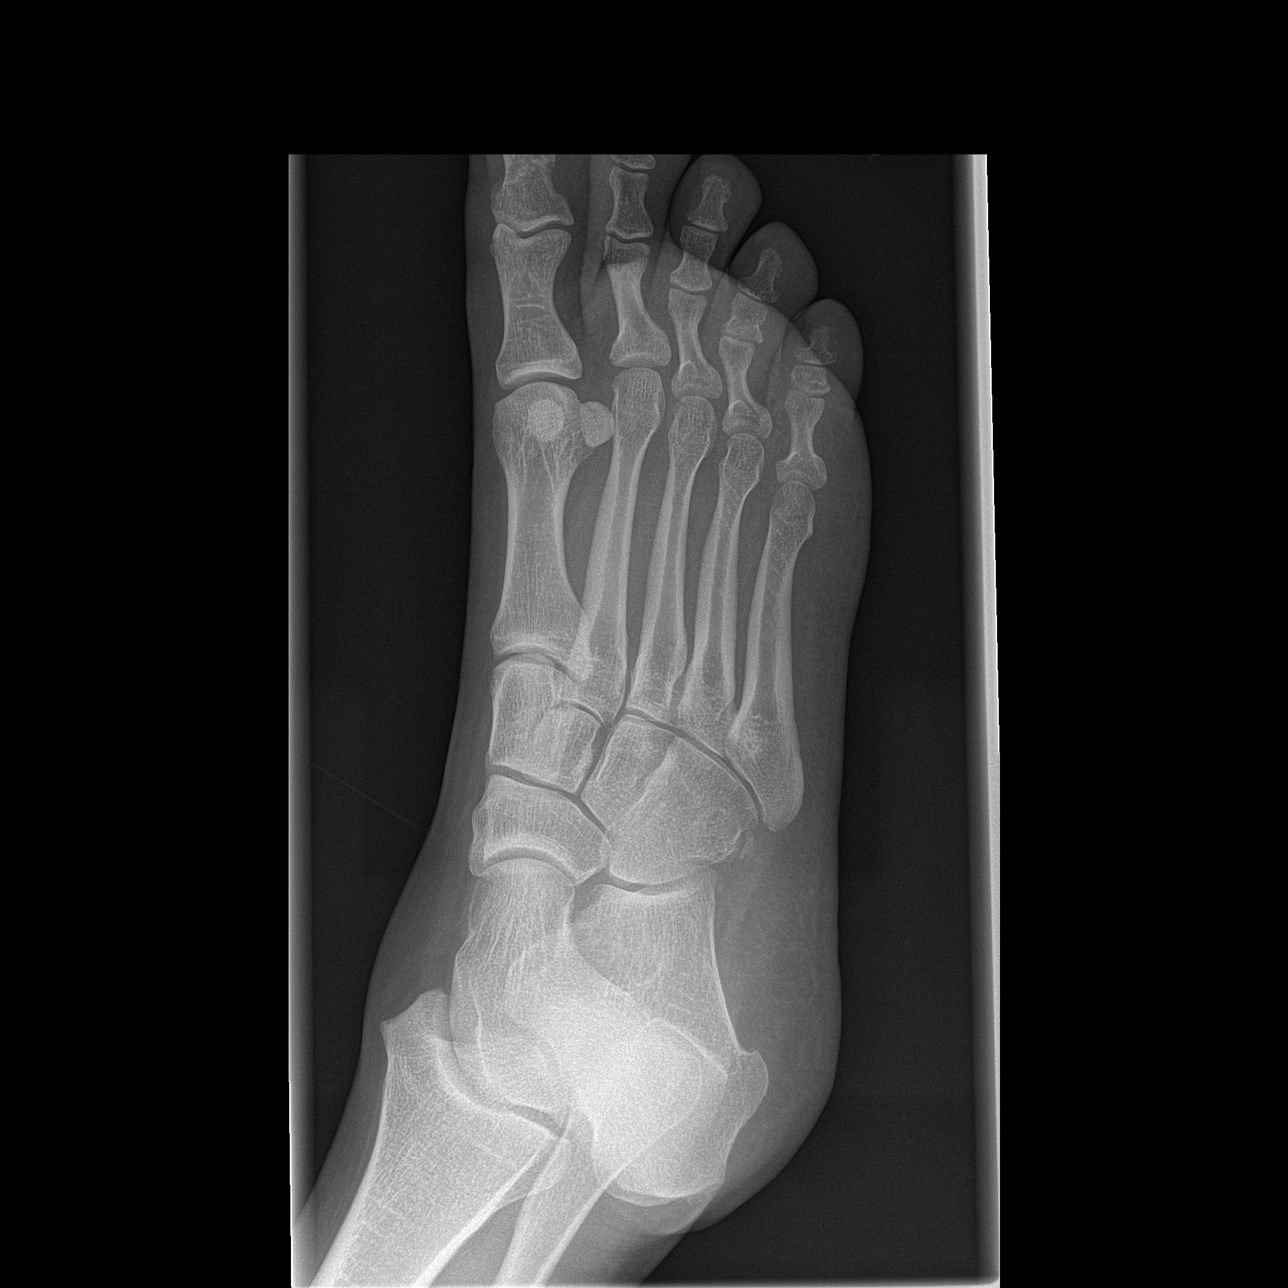

[t foot lat right]
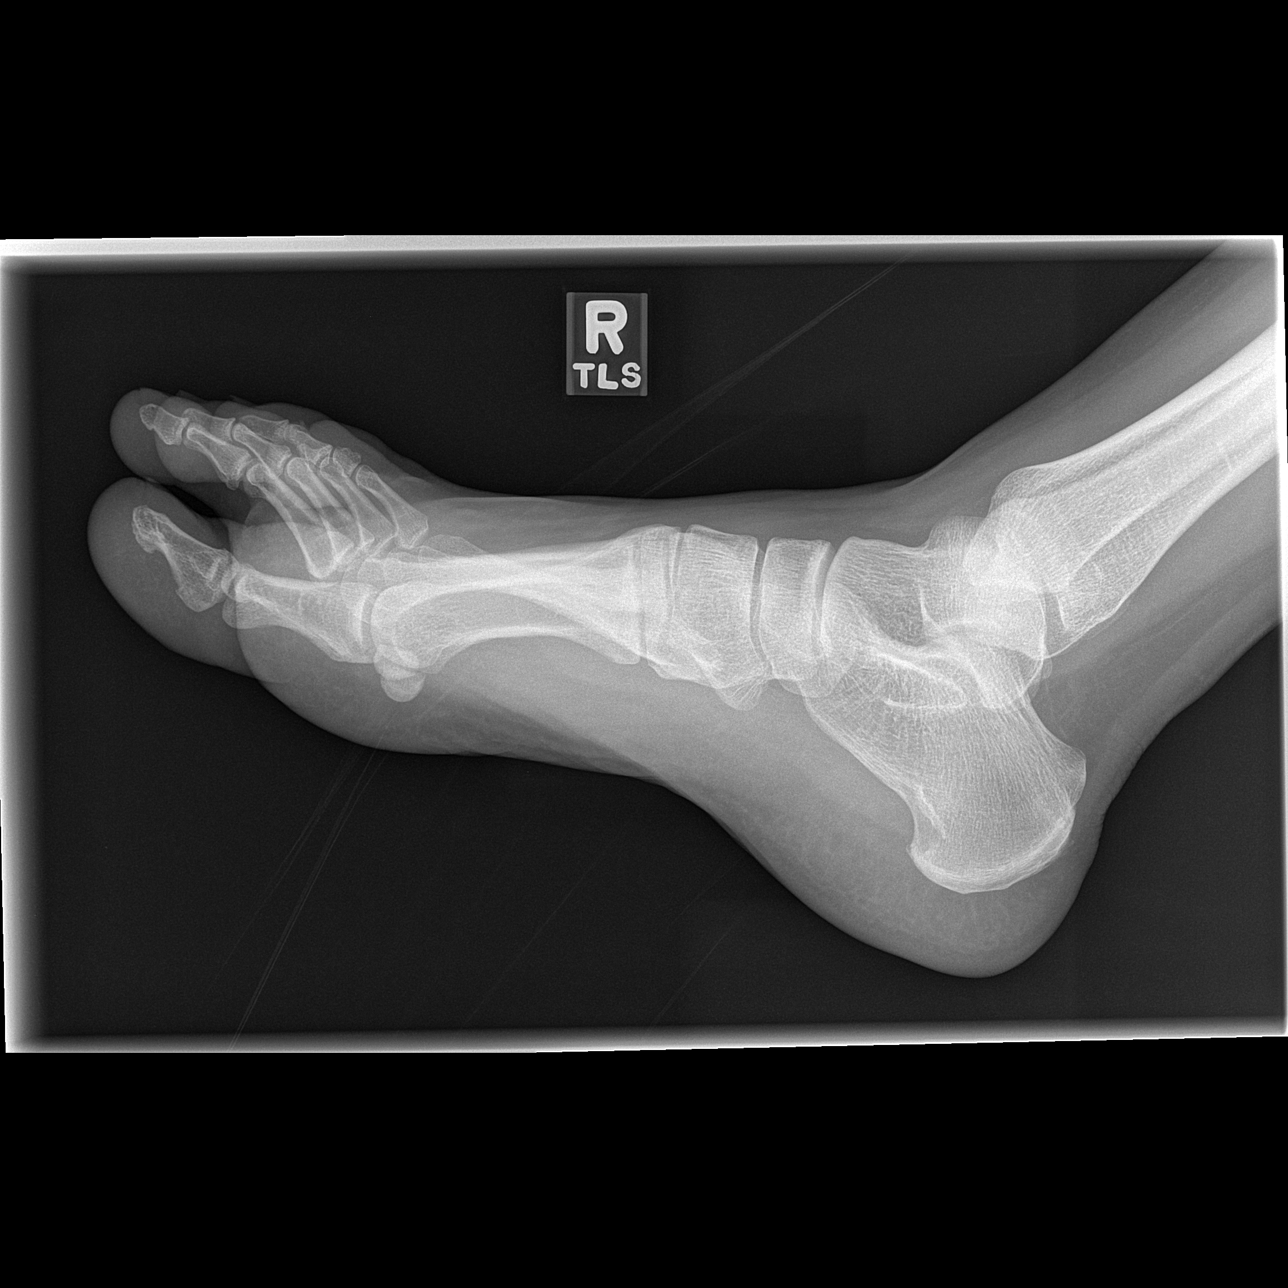

[3 of 3 positions shown; findings below may reference images not displayed]

FINDINGS: Cortical irregularity at the base of the middle phalanx
of the fourth toe; while there is a Mach line which passes through
this area, this may represent a nondisplaced fracture.  No other
fractures elsewhere.  Well-preserved joint spaces.  No other
intrinsic osseous abnormalities.  Accessory ossicle adjacent to the
cuboid.
IMPRESSION: Possible nondisplaced fracture involving the base of the middle
phalanx of the fourth toe.  No skeletal abnormalities otherwise.

## 2009-07-17 ENCOUNTER — Emergency Department (HOSPITAL_COMMUNITY): Admission: EM | Admit: 2009-07-17 | Discharge: 2009-07-17 | Payer: Self-pay | Admitting: Emergency Medicine

## 2009-07-17 IMAGING — US US ABDOMEN COMPLETE
1 series · 14 of 25 positions shown · non-contrast
Comparison: [DATE]

CLINICAL DATA: Upper abdominal pain

ABDOMEN ULTRASOUND
TECHNIQUE: Routine

[Series 1: us abdomen complete · 0.30mm/px · 14 of 70 slices shown]
[im 1/70]
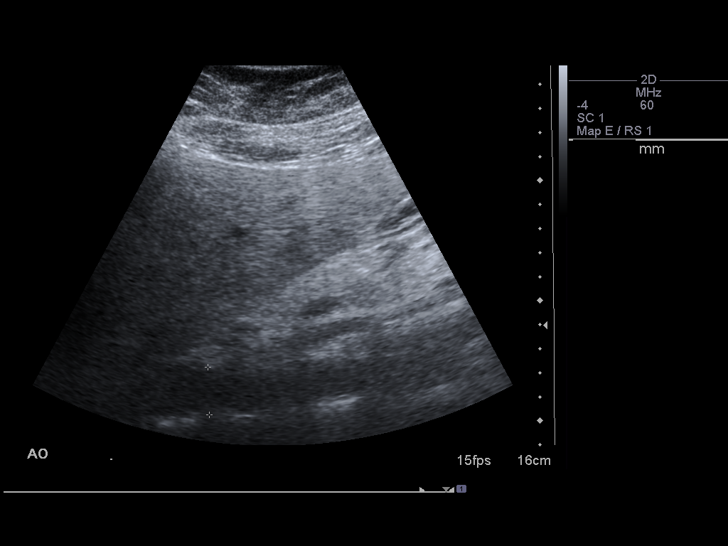
[im 6/70]
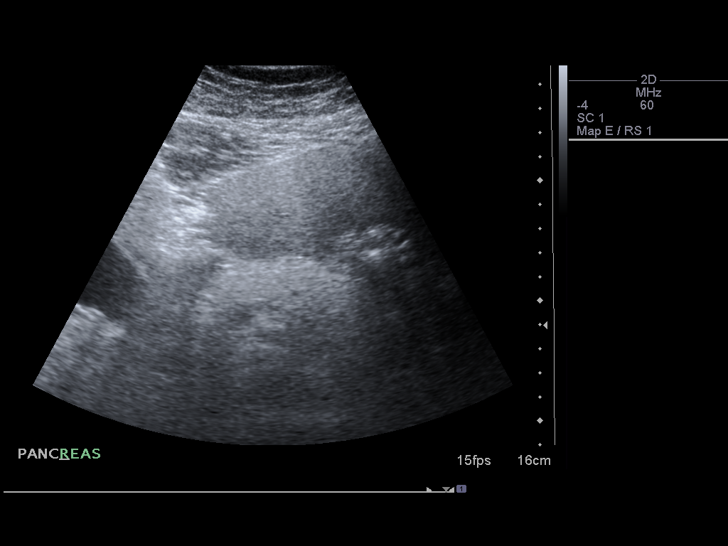
[im 12/70]
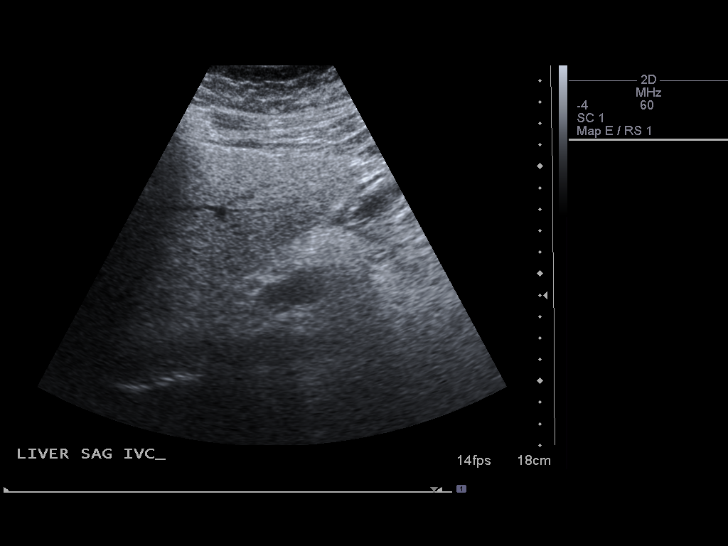
[im 18/70]
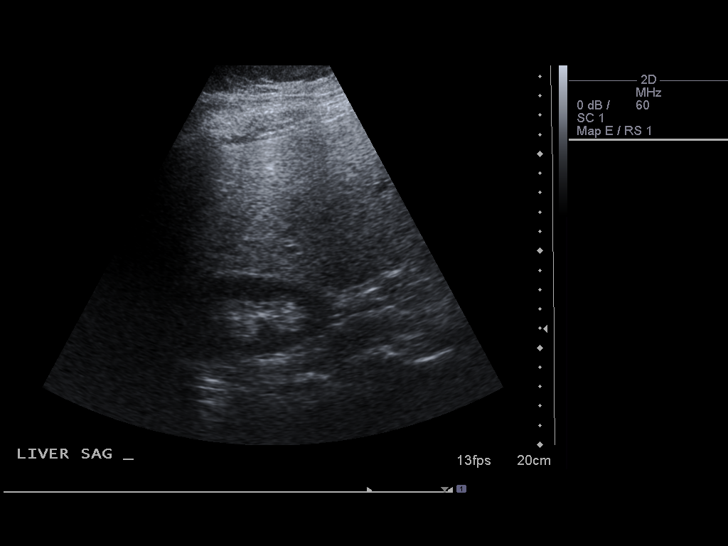
[im 24/70]
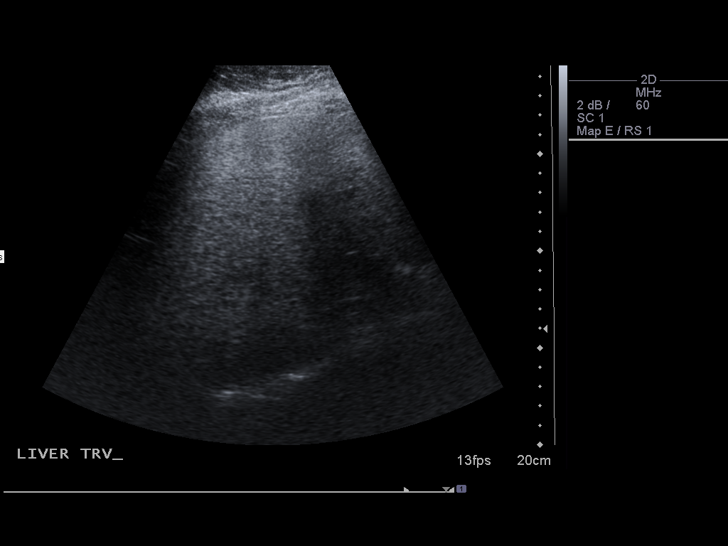
[im 26/70]
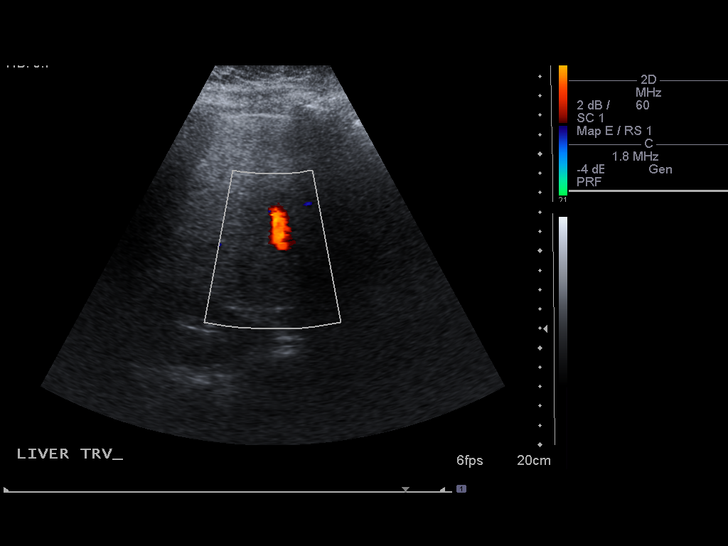
[im 32/70]
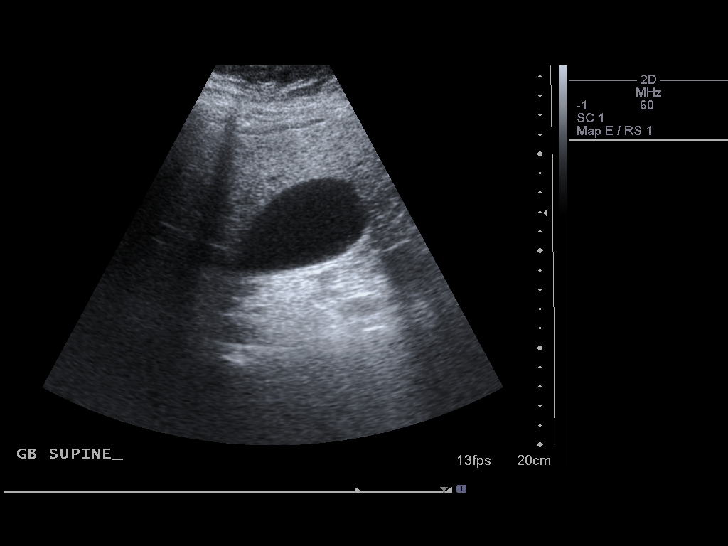
[im 38/70]
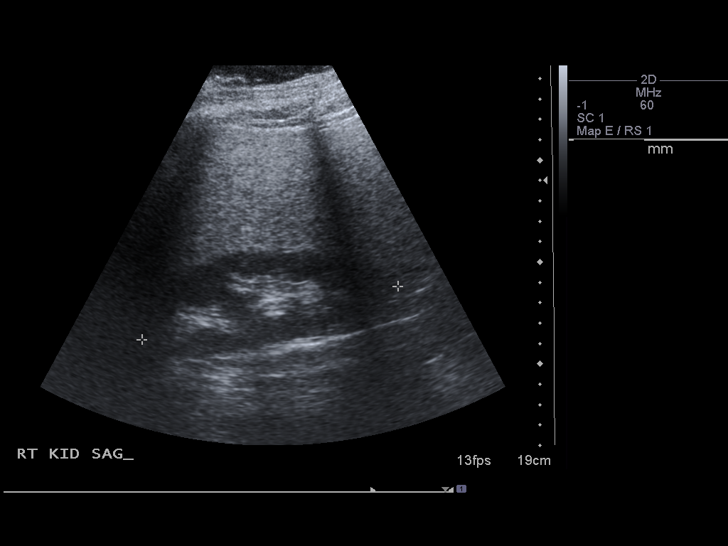
[im 44/70]
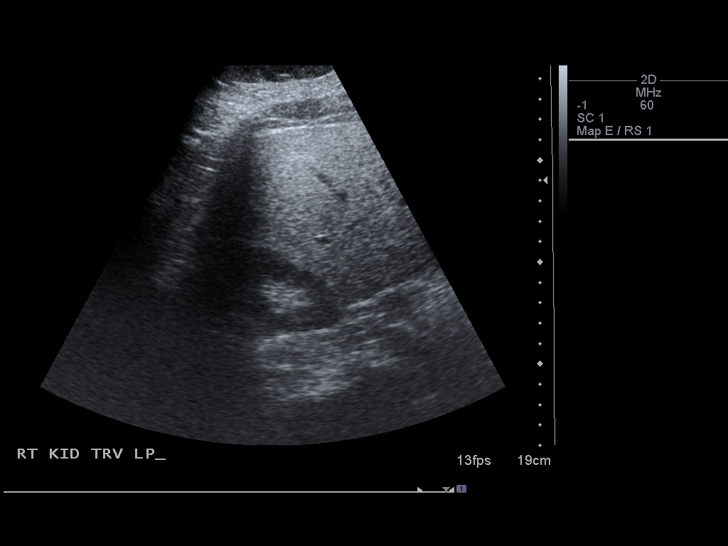
[im 47/70]
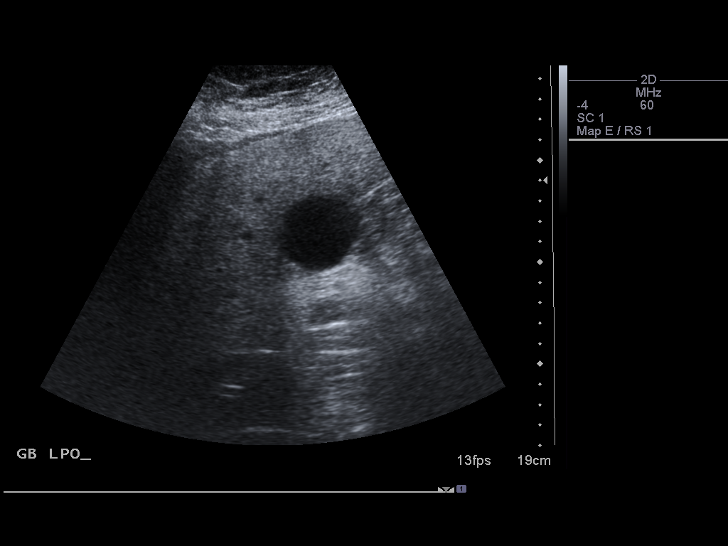
[im 52/70]
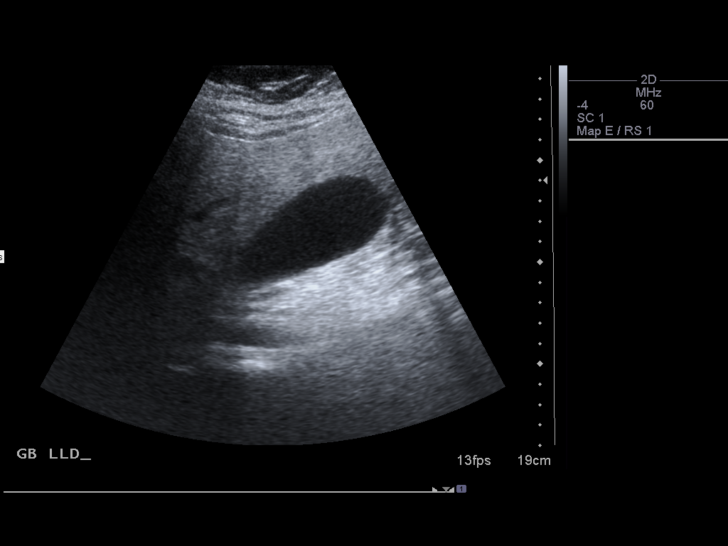
[im 58/70]
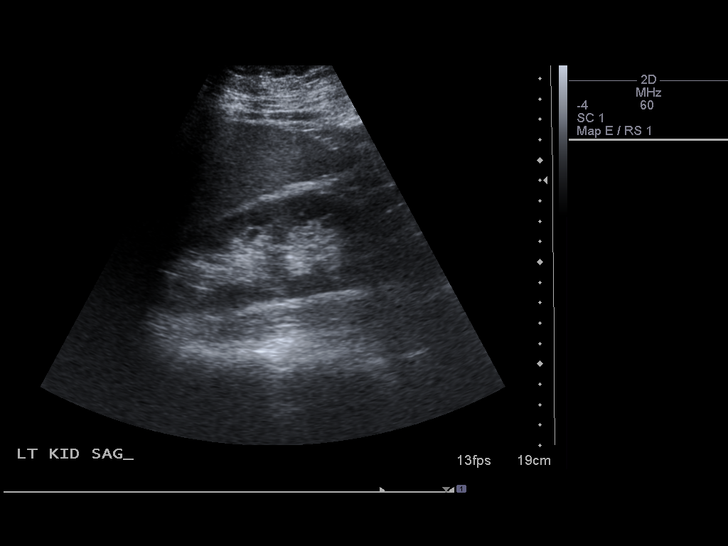
[im 64/70]
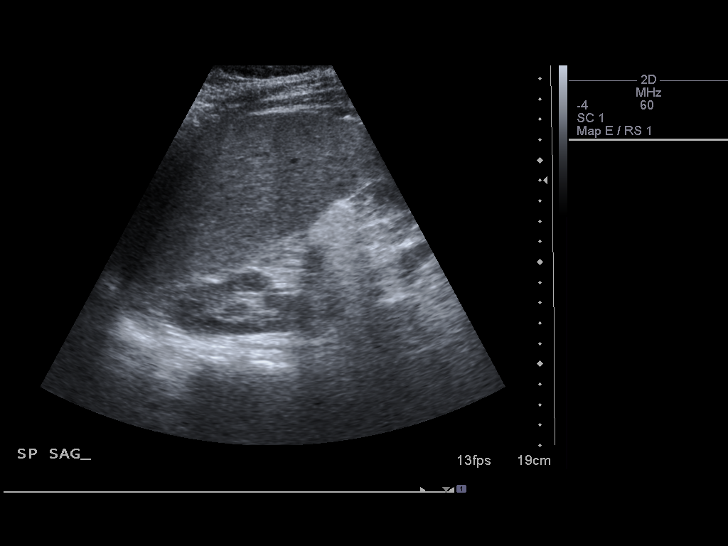
[im 70/70]
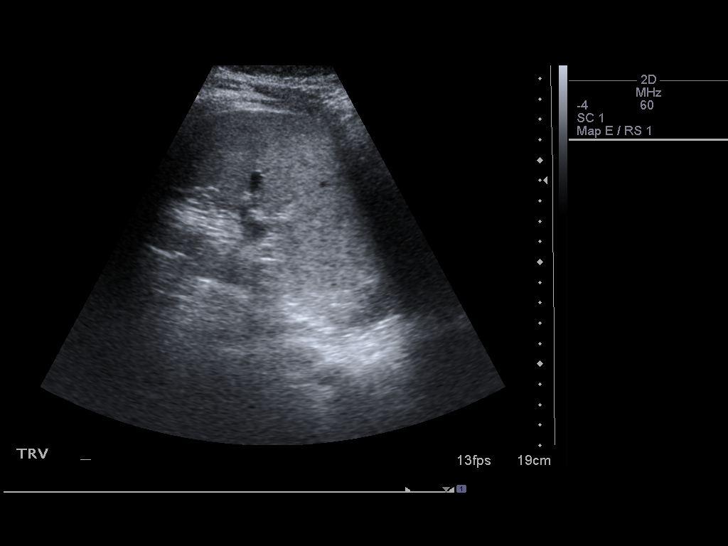

[14 of 25 positions shown; findings below may reference images not displayed]

FINDINGS: Gallbladder and bile ducts normal.  Common bile duct
measures 4.7 mm.

No focal lesions of the liver.  The echotexture is dense as
previously noted.  This is most consistent with steatosis.

Pancreas, IVC, spleen, and aorta normal.  Kidneys size normal
without hydronephrosis, visible calculi, or masses.  No ascites.
IMPRESSION: 1.  No acute findings - specifically no evidence for cholecystitis.
2.  Echodense liver as previously noted - most consistent with
steatosis.

## 2010-06-04 ENCOUNTER — Emergency Department (HOSPITAL_COMMUNITY): Admission: EM | Admit: 2010-06-04 | Discharge: 2010-06-04 | Payer: Self-pay | Admitting: Emergency Medicine

## 2010-06-04 IMAGING — CR DG CHEST 2V
2 series · 2 of 2 positions shown · non-contrast
Comparison: [DATE]

CLINICAL DATA: Chest pain

CHEST - 2 VIEW

[w chest pa]
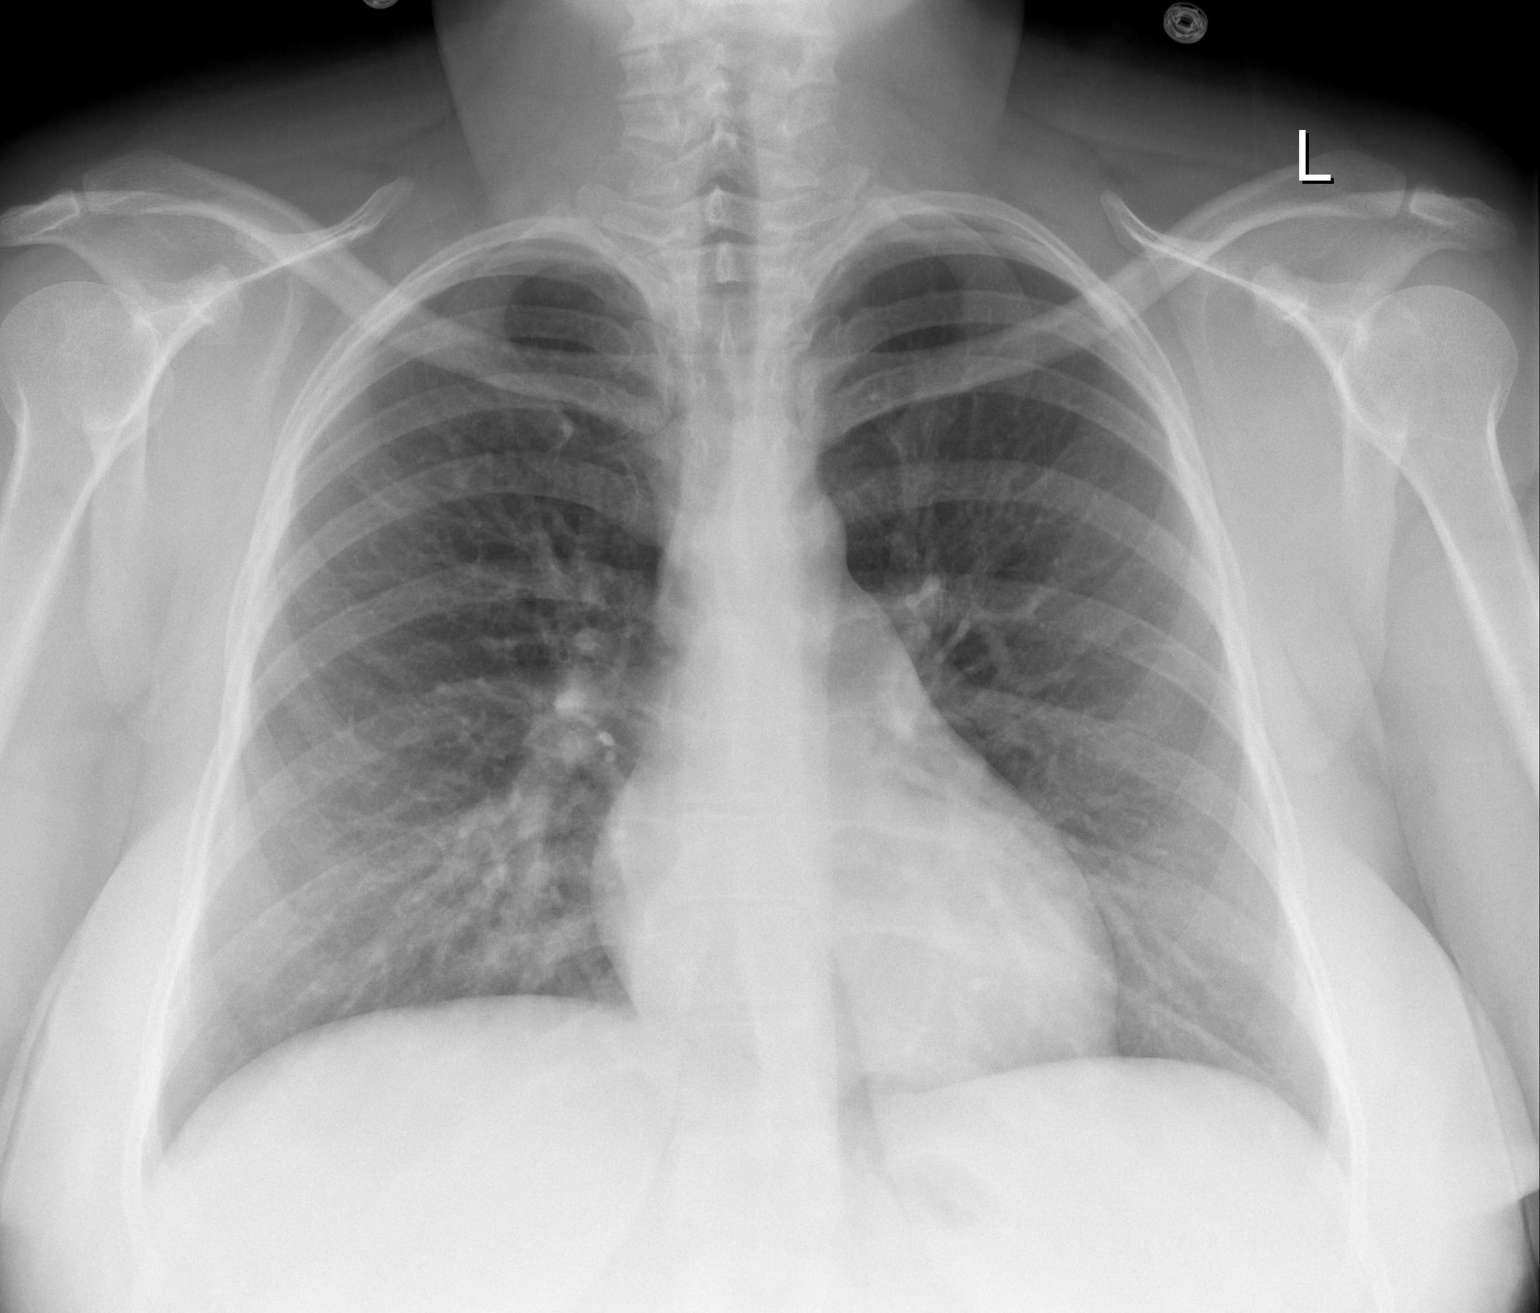

[w chest lat]
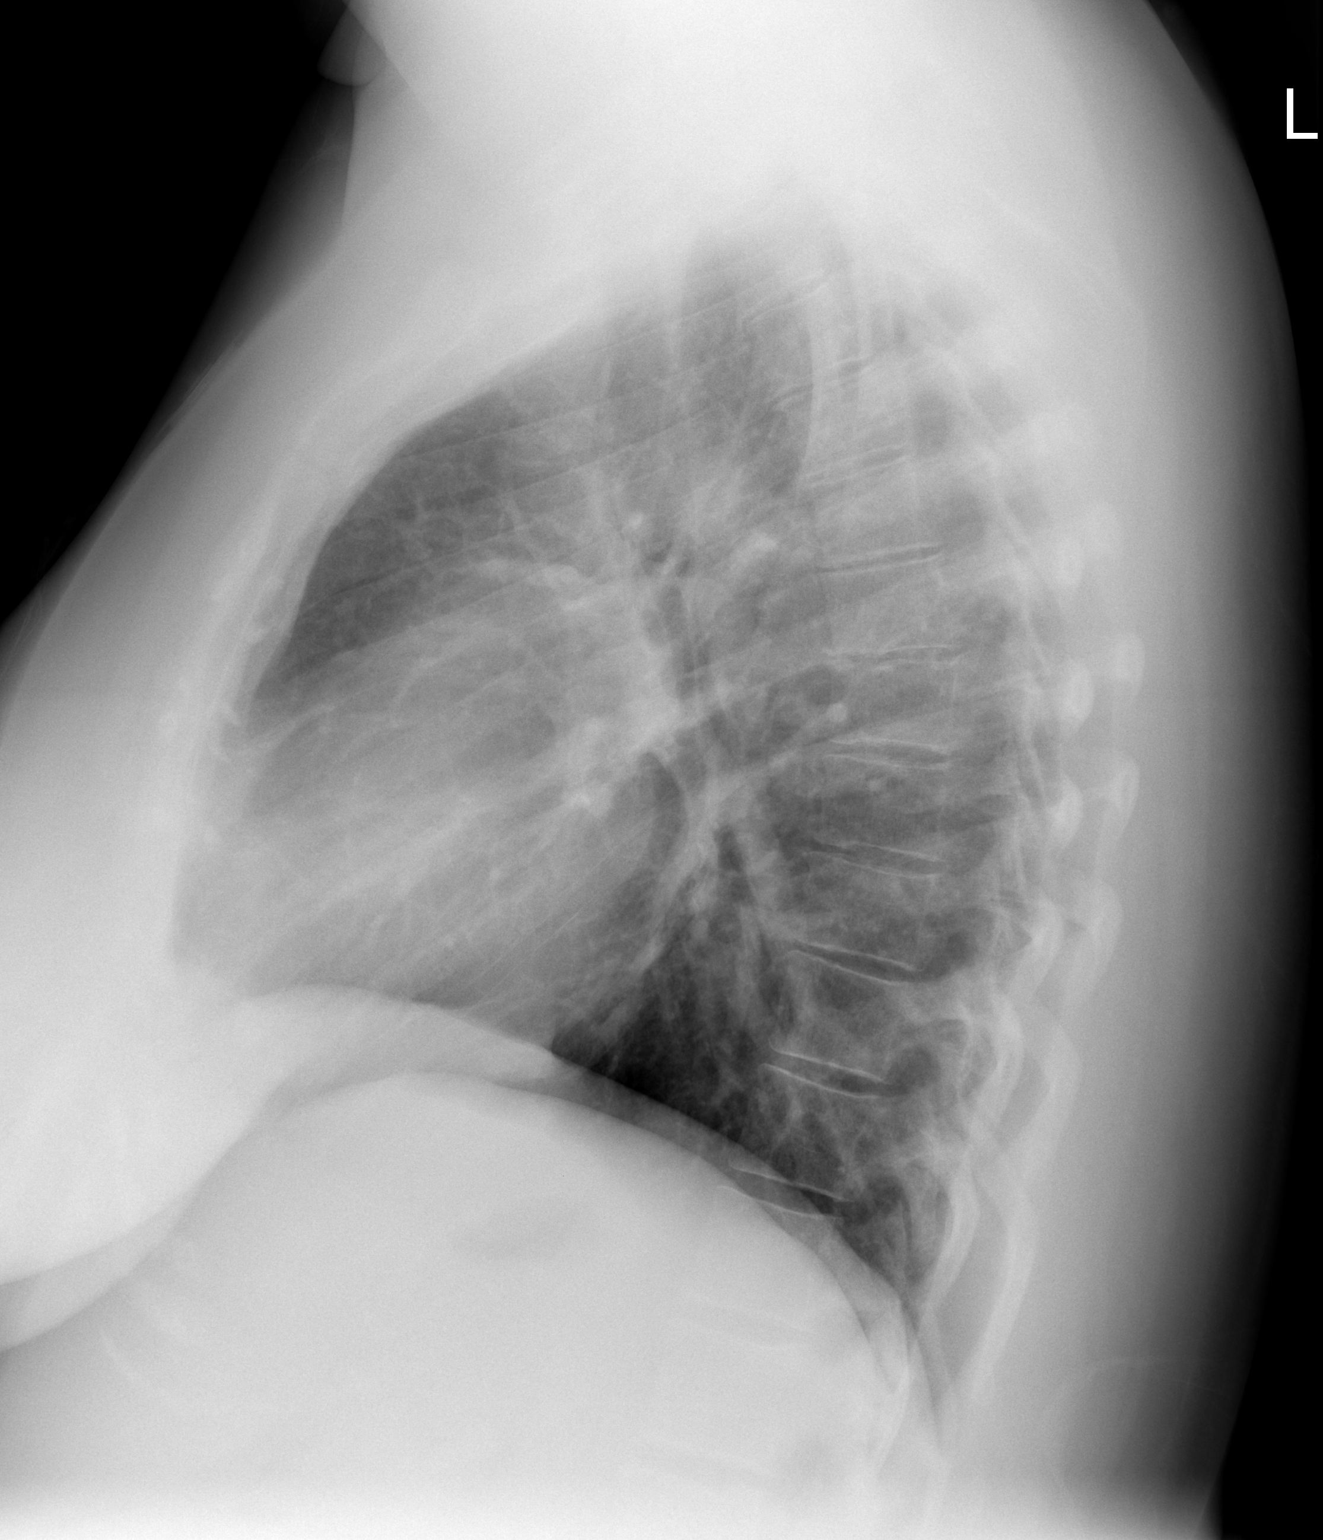

[2 of 2 positions shown; findings below may reference images not displayed]

FINDINGS: There is some atelectasis and bronchial thickening in the
right lower lung.  No edema, overt infiltrate or pleural fluid
identified.  The heart size and mediastinal contours are within
normal limits.  Bony thorax is unremarkable.
IMPRESSION: Atelectasis and bronchial thickening in the right lower lung.

## 2010-07-19 ENCOUNTER — Emergency Department (HOSPITAL_COMMUNITY): Admission: EM | Admit: 2010-07-19 | Discharge: 2010-07-19 | Payer: Self-pay | Admitting: Emergency Medicine

## 2010-07-19 IMAGING — CR DG WRIST COMPLETE 3+V*R*
4 series · 4 of 4 positions shown · non-contrast
Comparison: Right wrist films of [DATE]

CLINICAL DATA: Pain, no injury

RIGHT WRIST - COMPLETE 3+ VIEW

[x wrist pa right]
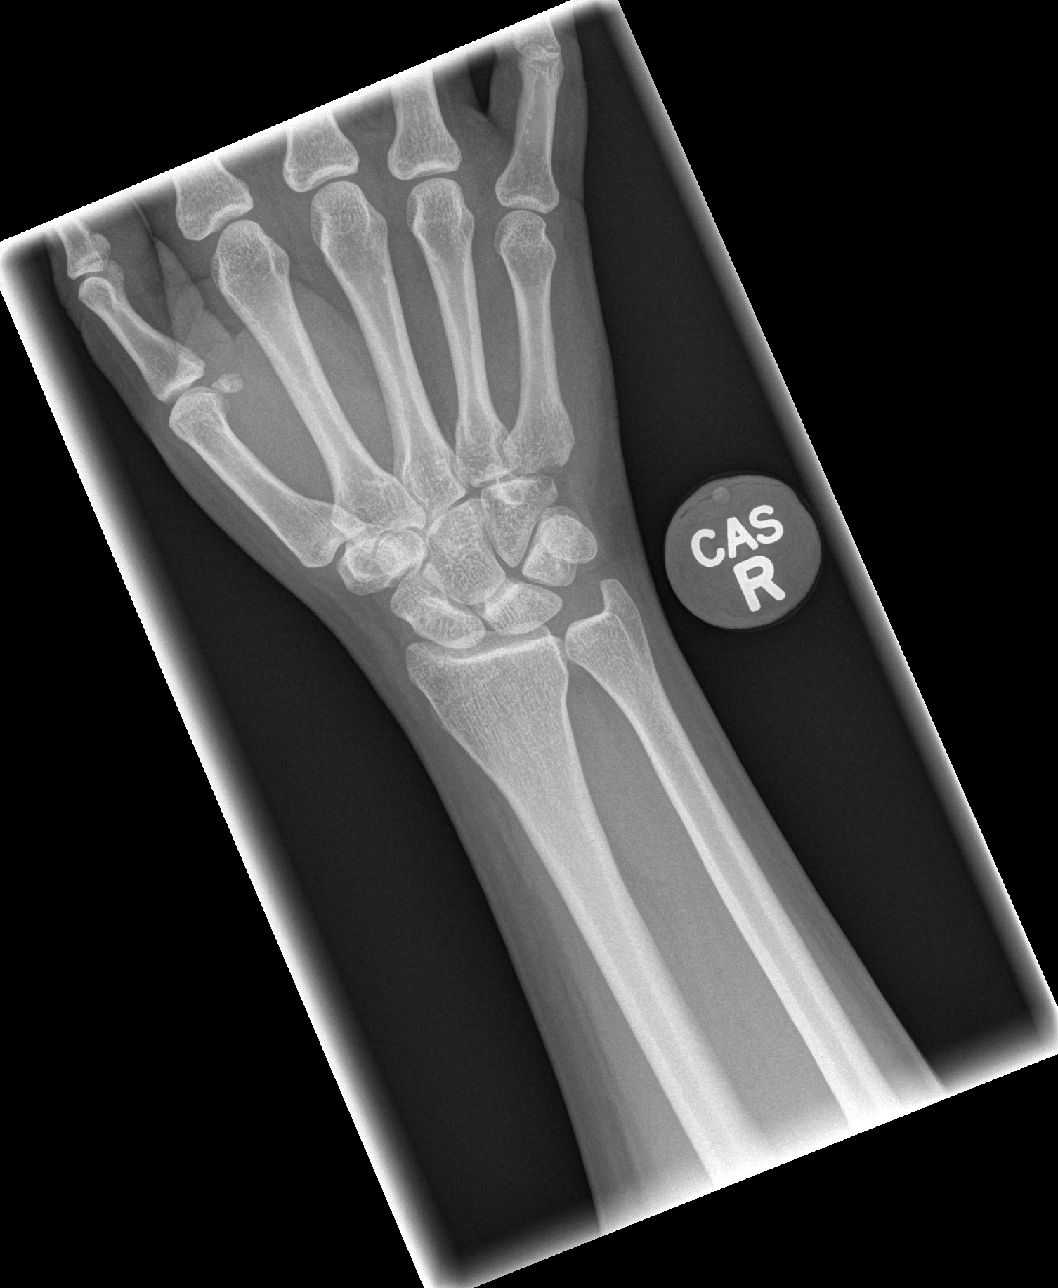

[x wrist obl right]
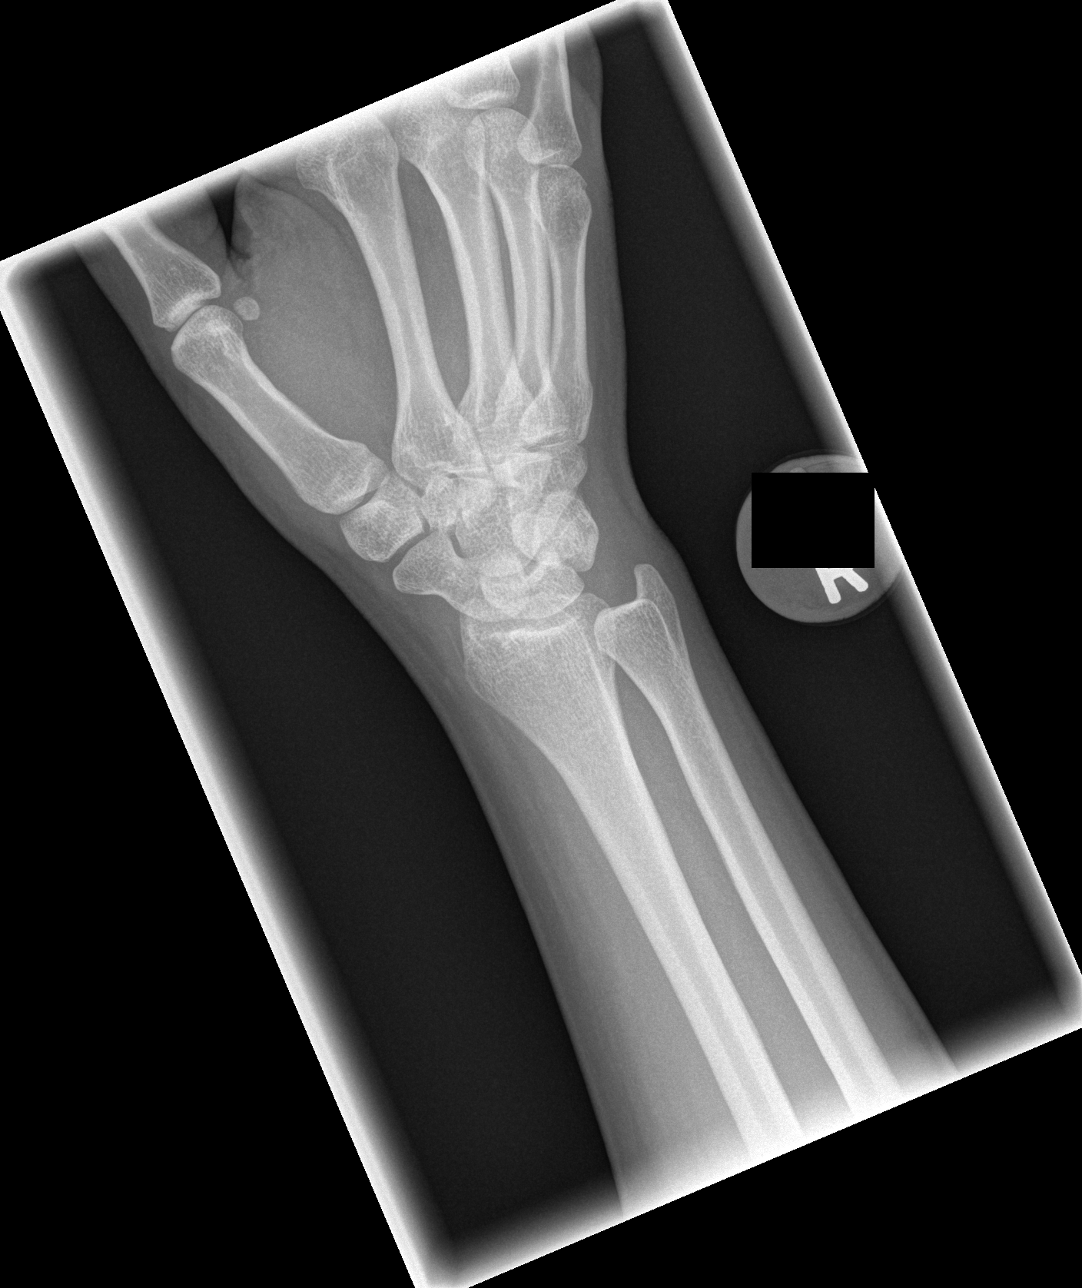

[x wrist lat right]
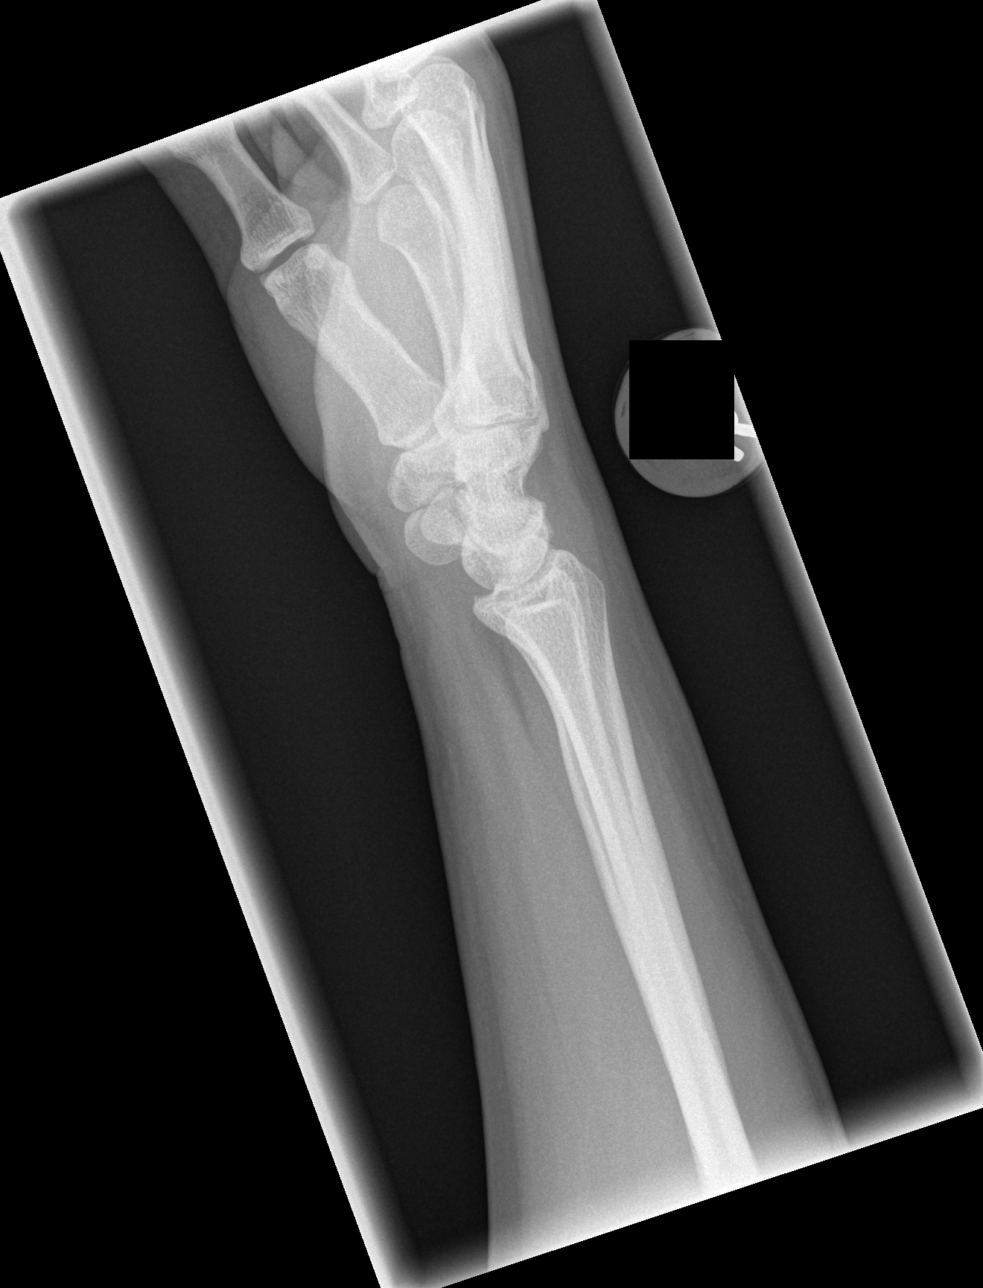

[x wrist navicular]
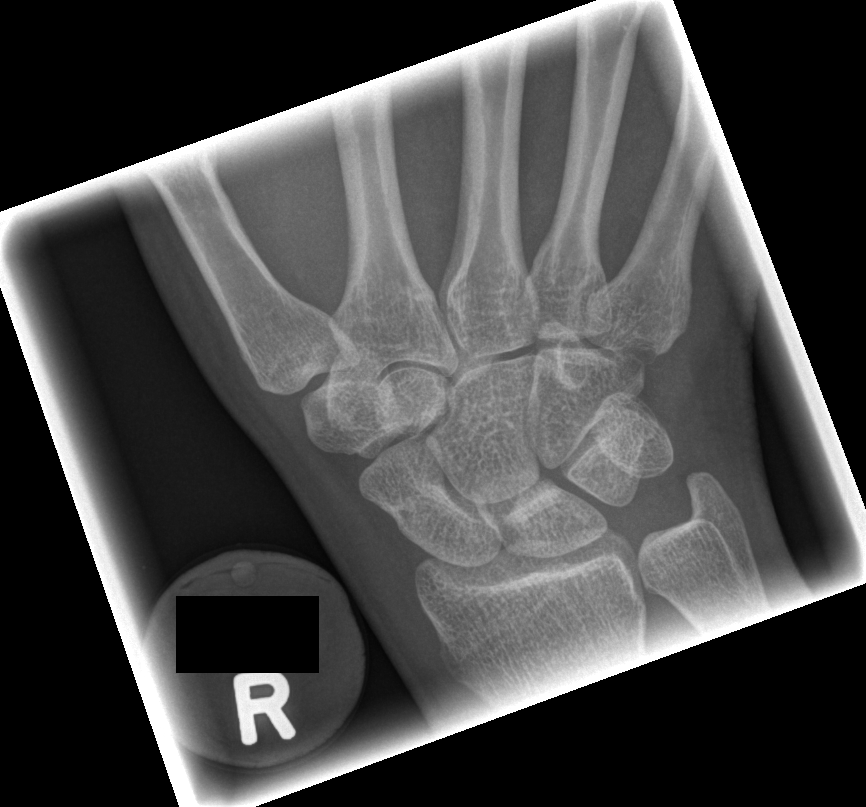

[4 of 4 positions shown; findings below may reference images not displayed]

FINDINGS: The right radiocarpal joint space appears normal.  The
ulnar styloid is intact.  The carpal bones are in normal position.
No acute abnormality is seen.
IMPRESSION: Negative right wrist.

## 2010-11-04 ENCOUNTER — Emergency Department (HOSPITAL_COMMUNITY)
Admission: EM | Admit: 2010-11-04 | Discharge: 2010-11-04 | Payer: Self-pay | Source: Home / Self Care | Admitting: Emergency Medicine

## 2010-11-04 IMAGING — CR DG HAND COMPLETE 3+V*R*
3 series · 3 of 3 positions shown · non-contrast
Comparison: None.

CLINICAL DATA: Left fifth finger injury.

RIGHT HAND - COMPLETE 3+ VIEW

[x hand pa right]
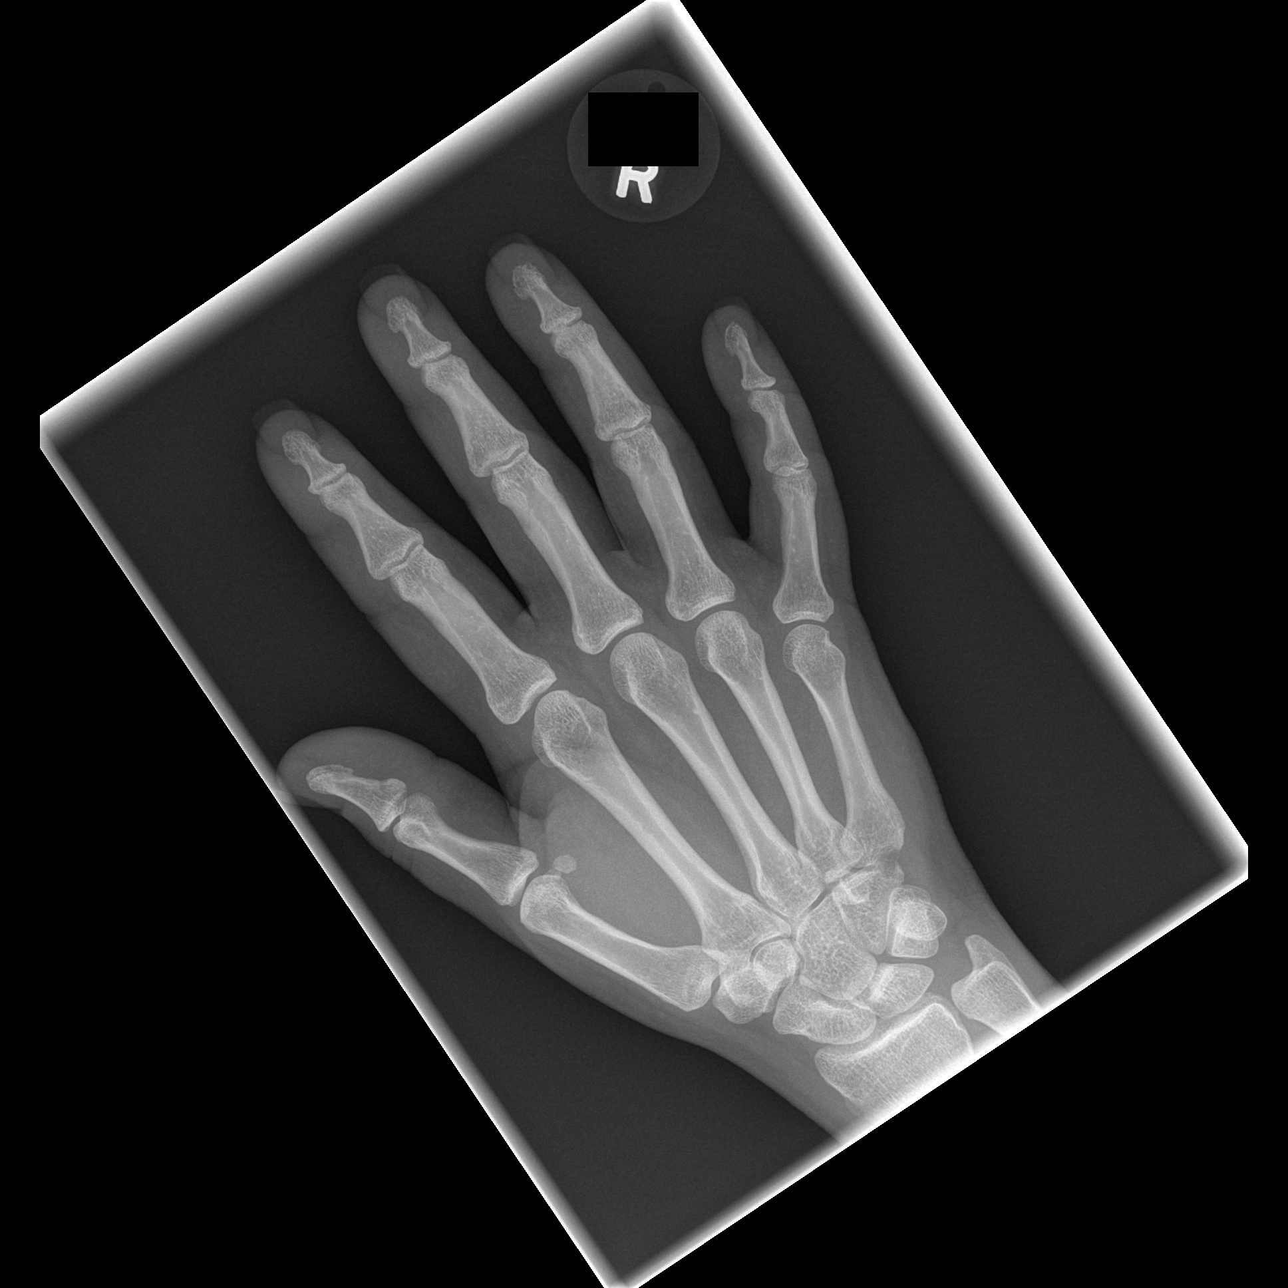

[x hand oblique right]
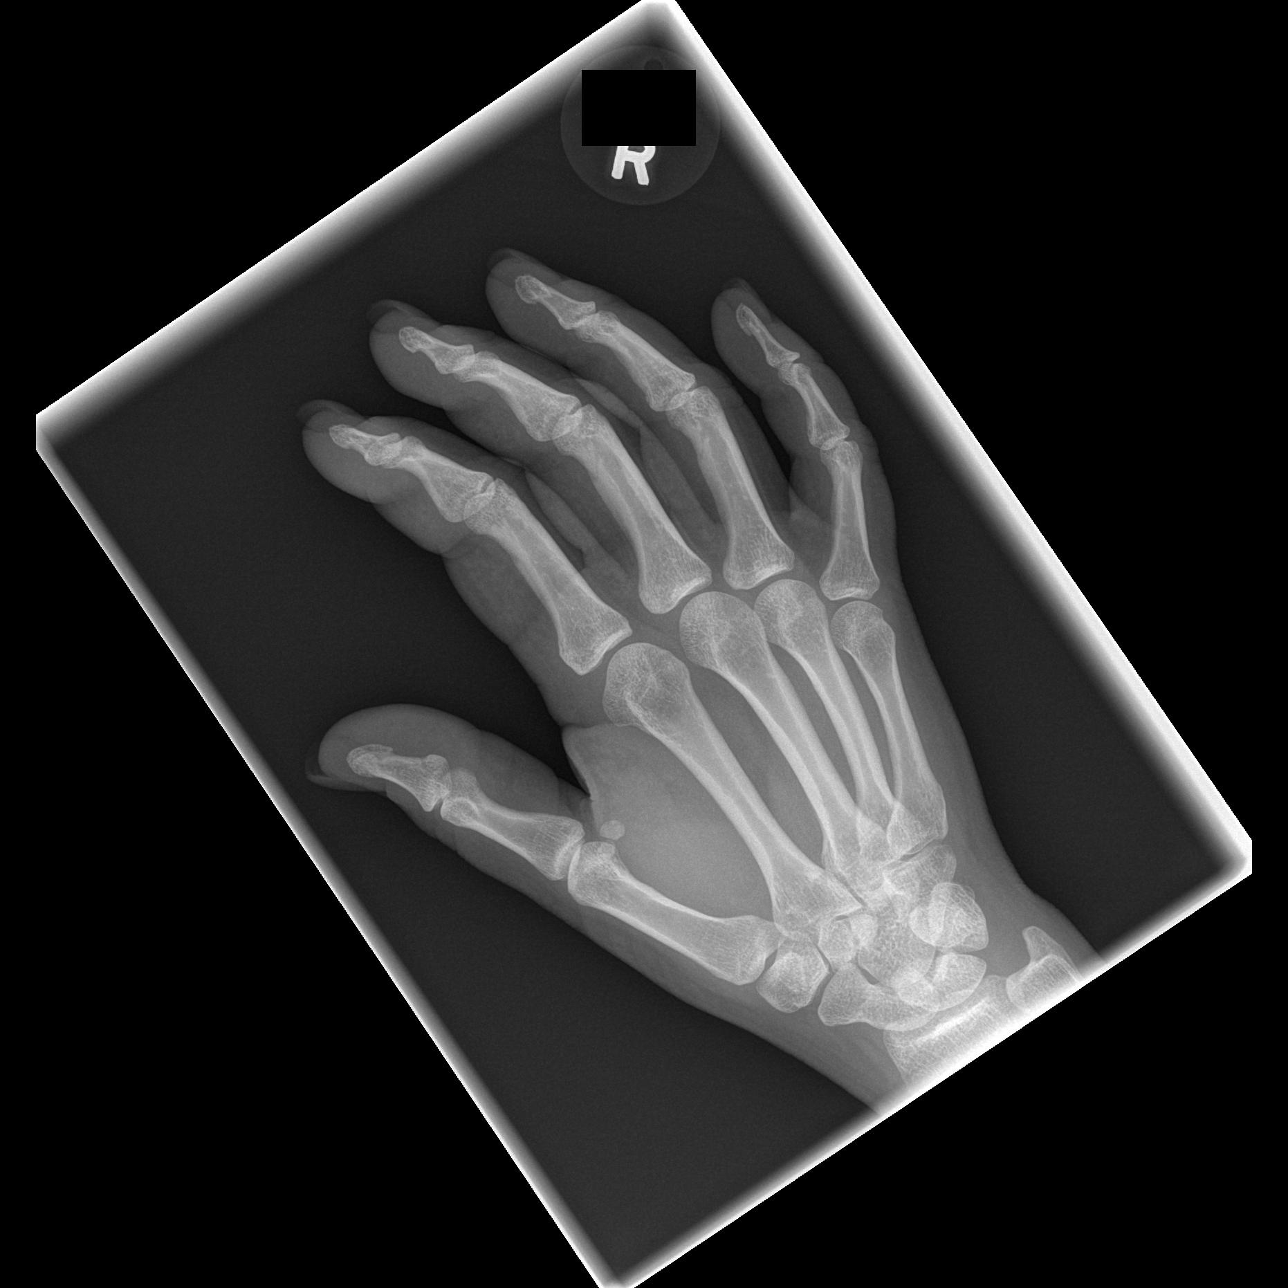

[x hand lat right]
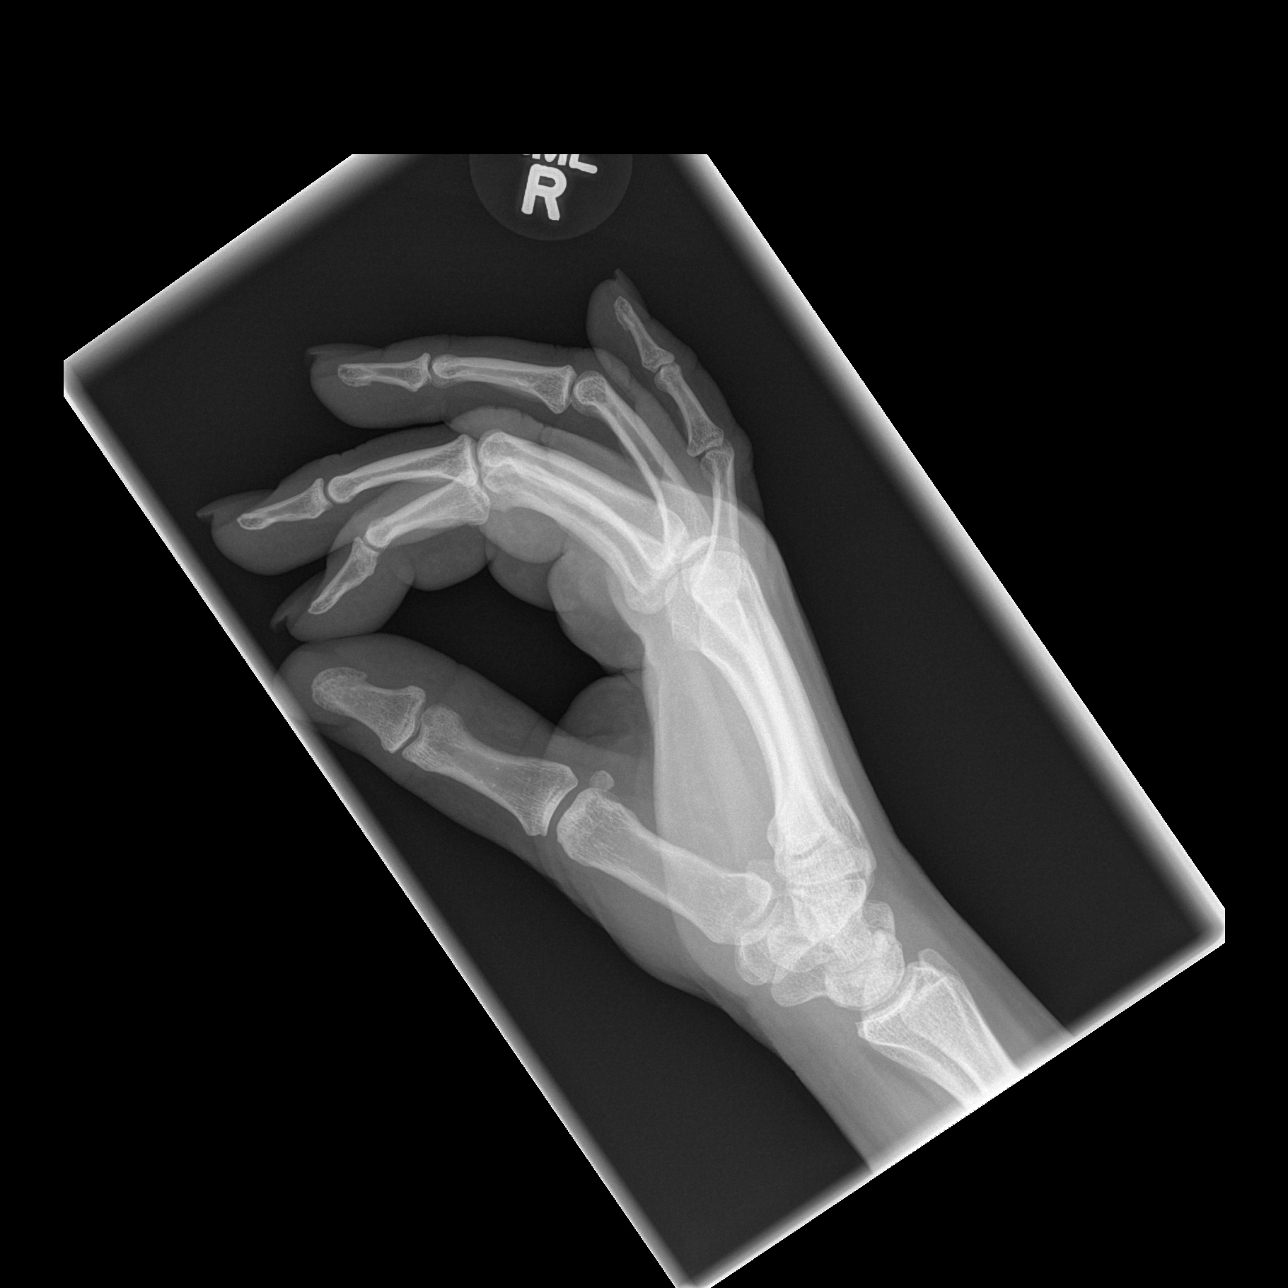

[3 of 3 positions shown; findings below may reference images not displayed]

FINDINGS: There is no evidence of fracture or dislocation.  There
is no evidence of arthropathy or other focal bone abnormality.
Soft tissues are unremarkable.
IMPRESSION: Negative.

## 2011-01-18 LAB — POCT CARDIAC MARKERS
CKMB, poc: 1.7 ng/mL (ref 1.0–8.0)
Myoglobin, poc: 52.1 ng/mL (ref 12–200)
Troponin i, poc: <0.05 ng/mL (ref 0.00–0.09)

## 2011-02-08 LAB — URINALYSIS, ROUTINE W REFLEX MICROSCOPIC
Bilirubin Urine: NEGATIVE
Glucose, UA: NEGATIVE mg/dL
Hgb urine dipstick: NEGATIVE
Ketones, ur: NEGATIVE mg/dL
Nitrite: NEGATIVE
Protein, ur: NEGATIVE mg/dL
Specific Gravity, Urine: 1.027 (ref 1.005–1.030)
Urobilinogen, UA: 0.2 mg/dL (ref 0.0–1.0)
pH: 5.5 (ref 5.0–8.0)

## 2011-02-08 LAB — COMPREHENSIVE METABOLIC PANEL
ALT: 85 U/L — ABNORMAL HIGH (ref 0–35)
AST: 54 U/L — ABNORMAL HIGH (ref 0–37)
Albumin: 4 g/dL (ref 3.5–5.2)
Alkaline Phosphatase: 60 U/L (ref 39–117)
BUN: 11 mg/dL (ref 6–23)
CO2: 22 mEq/L (ref 19–32)
Calcium: 8.9 mg/dL (ref 8.4–10.5)
Chloride: 105 mEq/L (ref 96–112)
Creatinine, Ser: 0.63 mg/dL (ref 0.4–1.2)
GFR calc Af Amer: >60 mL/min (ref >60)
GFR calc non Af Amer: >60 mL/min (ref >60)
Glucose, Bld: 97 mg/dL (ref 70–99)
Potassium: 3.4 mEq/L — ABNORMAL LOW (ref 3.5–5.1)
Sodium: 137 mEq/L (ref 135–145)
Total Bilirubin: 1 mg/dL (ref 0.3–1.2)
Total Protein: 8.1 g/dL (ref 6.0–8.3)

## 2011-02-08 LAB — CBC
HCT: 39.7 % (ref 36.0–46.0)
Hemoglobin: 13.4 g/dL (ref 12.0–15.0)
MCHC: 33.7 g/dL (ref 30.0–36.0)
MCV: 81.7 fL (ref 78.0–100.0)
Platelets: 195 10*3/uL (ref 150–400)
RBC: 4.86 MIL/uL (ref 3.87–5.11)
RDW: 14.4 % (ref 11.5–15.5)
WBC: 8.9 10*3/uL (ref 4.0–10.5)

## 2011-02-08 LAB — DIFFERENTIAL
Basophils Absolute: 0 10*3/uL (ref 0.0–0.1)
Basophils Relative: 0 % (ref 0–1)
Eosinophils Absolute: 0 10*3/uL (ref 0.0–0.7)
Eosinophils Relative: 0 % (ref 0–5)
Lymphocytes Relative: 8 % — ABNORMAL LOW (ref 12–46)
Lymphs Abs: 0.7 10*3/uL (ref 0.7–4.0)
Monocytes Absolute: 0.3 10*3/uL (ref 0.1–1.0)
Monocytes Relative: 3 % (ref 3–12)
Neutro Abs: 7.9 10*3/uL — ABNORMAL HIGH (ref 1.7–7.7)
Neutrophils Relative %: 89 % — ABNORMAL HIGH (ref 43–77)

## 2011-02-08 LAB — LIPASE, BLOOD: Lipase: 14 U/L (ref 11–59)

## 2011-03-22 NOTE — Op Note (Signed)
[facility]  Patient:    Angela Castillo, Angela Castillo                 MRN: [id number] Proc. Date: 07/24/00 Adm. Date:  [id number] Disc. Date: [id number] Attending:  Jerald Kief                           Operative Report  PREOPERATIVE DIAGNOSIS:       Requests permanent, voluntary sterilization.  POSTOPERATIVE DIAGNOSIS:      Requests permanent, voluntary sterilization.  OPERATION:                    Bilateral tubal ligation.  SURGEON:                      Justus Memory, M.D.  ANESTHESIA:                   Failed epidural, general endotracheal.  ESTIMATED BLOOD LOSS:         Less than 20 cc.  COMPLICATIONS:                None.  FINDINGS:                     Normal pelvis  DESCRIPTION OF PROCEDURE:     The patient was taken to the operating room where an epidural anesthetic was administered.  This failed, however, and a general endotracheal anesthetic was administered.  The patient was placed on the operating table in supine position.  The abdomen was prepped and draped in the usual sterile fashion with Betadine and sterile drapes.  The abdomen was entered through a small infraumbilical skin incision and carried down sharply in the usual fashion.  The peritoneum was atraumatically entered.  Two [employer] retractors were then placed, and the left tube was identified, grasped with a Babcock clamp, and elevated into the incision.  The tube was then positively identified by tracing it to its fimbriated end. Approximately 2 cm from the uterus fundus through an avascular region of the mesosalpinx, a Mayo needle carrying one long strand of #1 plain suture was passed atraumatically.  The #1 plain suture was then cut into two segments, and an approximate 2 cm segment of tube was then tied off using the now two strands of #1 plain.  An approximate 1.5 cm segment of tube was then excised between the two existing ligatures.  A single suture of 0 silk was  placed on the medial tubal stump near the plain tie.  The same procedure was repeated upon the right tube.  All operative areas were hemostatic.  Attention was then turned to closure.  The anterior peritoneum and rectus muscle fascia was closed with a running stitch of 0 Vicryl. The skin was the reapproximated with a running subcuticular stitch of 3-0 Vicryl repeat, a sterile dressing applied.   Final sponge, needle, and instrument counts correct x 3.  The patient was awakened, extubated, and taken to the recovery room in good condition.  There were no perioperative complications. DD:  07/24/00 TD:  07/26/00 Job: [id number]

## 2011-07-23 ENCOUNTER — Emergency Department (HOSPITAL_COMMUNITY)
Admission: EM | Admit: 2011-07-23 | Discharge: 2011-07-23 | Disposition: A | Discharge status: Home / Self Care | Payer: PRIVATE HEALTH INSURANCE | Attending: Emergency Medicine | Admitting: Emergency Medicine

## 2011-07-23 ENCOUNTER — Emergency Department (HOSPITAL_COMMUNITY): Payer: PRIVATE HEALTH INSURANCE

## 2011-07-23 DIAGNOSIS — S63509A Unspecified sprain of unspecified wrist, initial encounter: Secondary | ICD-10-CM | POA: Insufficient documentation

## 2011-07-23 DIAGNOSIS — X58XXXA Exposure to other specified factors, initial encounter: Secondary | ICD-10-CM | POA: Insufficient documentation

## 2011-07-23 DIAGNOSIS — M25539 Pain in unspecified wrist: Secondary | ICD-10-CM | POA: Insufficient documentation

## 2011-07-23 IMAGING — CR DG WRIST COMPLETE 3+V*L*
4 series · 4 of 4 positions shown · non-contrast
Comparison: [DATE]

CLINICAL DATA: 34-day-old female with left wrist pain.

LEFT WRIST - COMPLETE 3+ VIEW

[x wrist pa left]
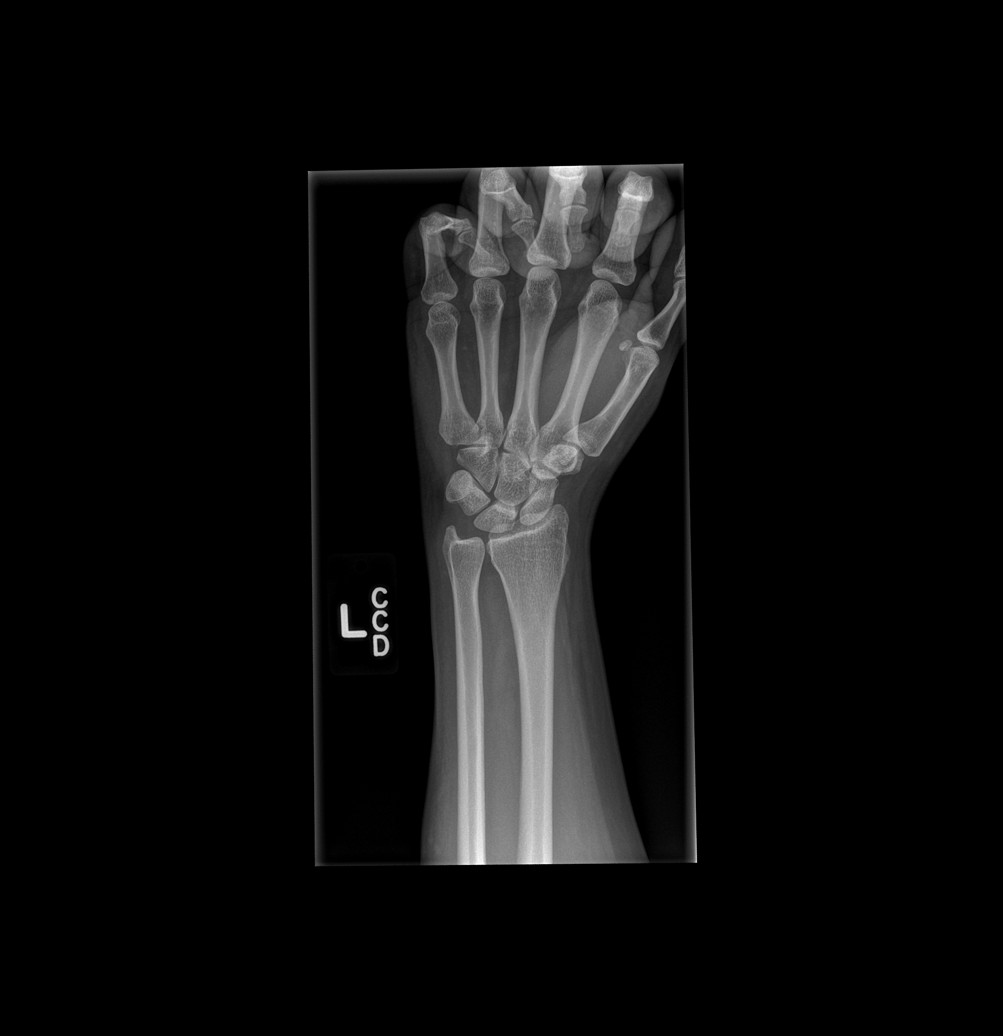

[x wrist obl left]
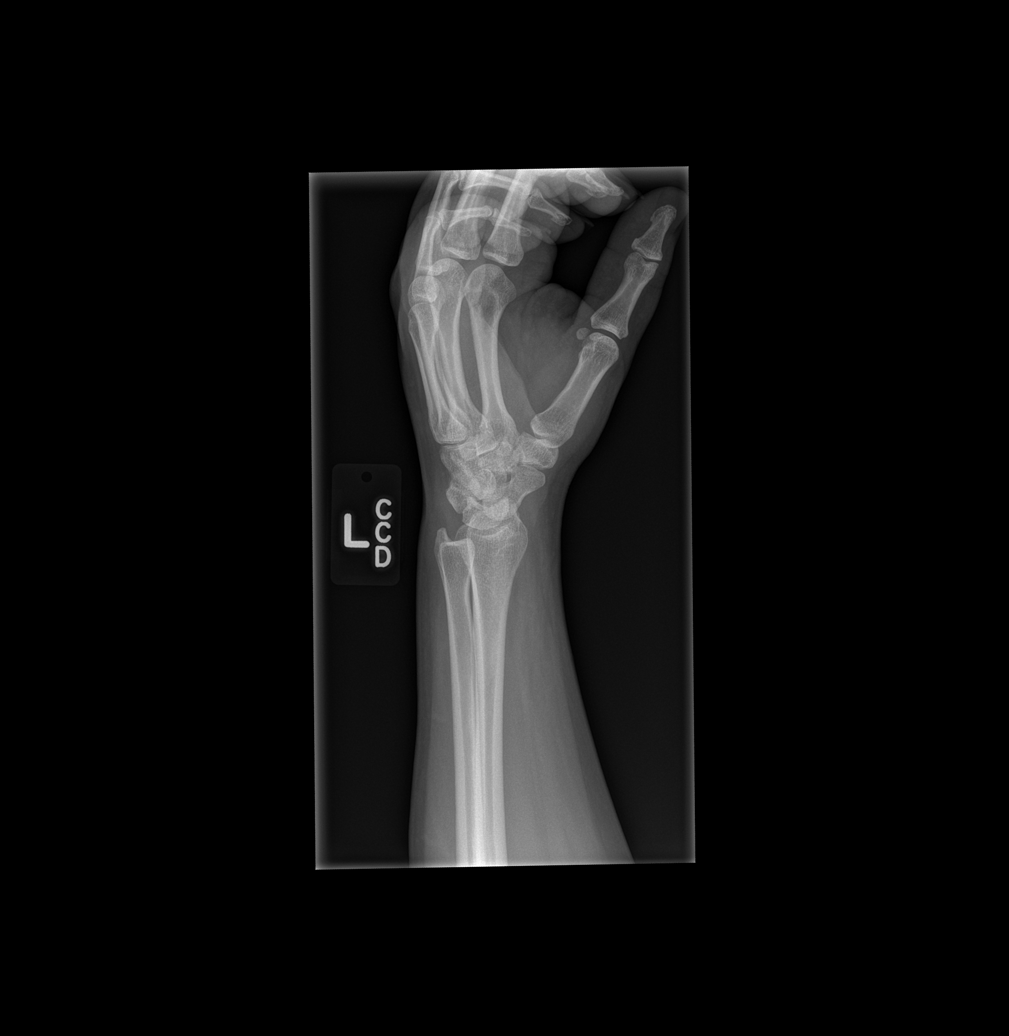

[x wrist lat left]
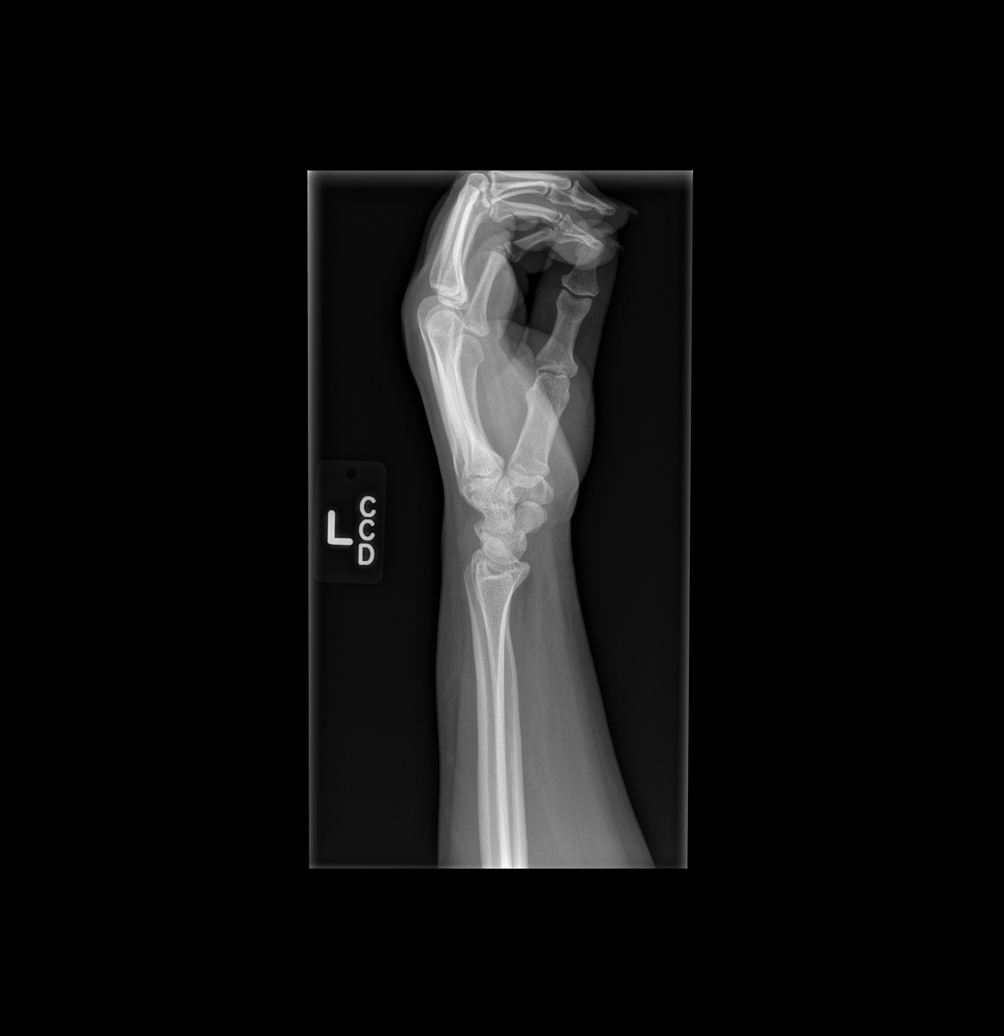

[x wrist navicular view left]
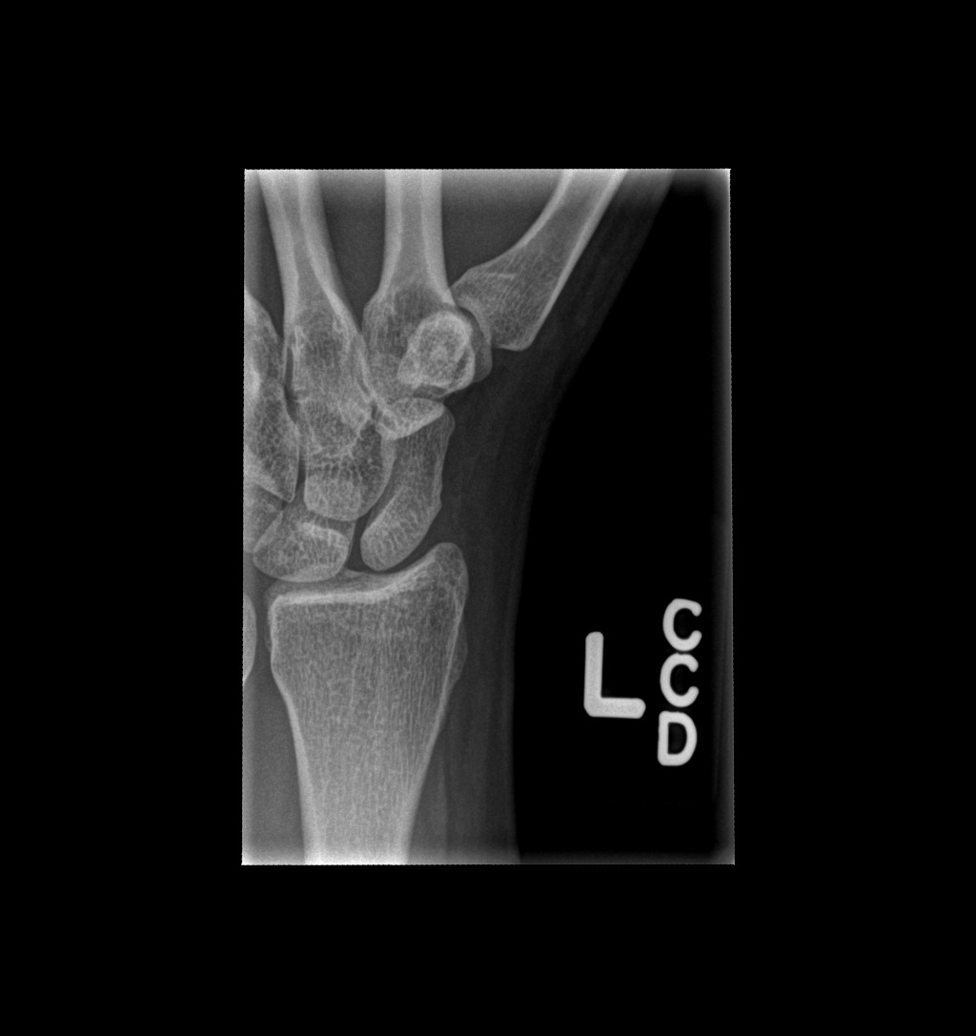

[4 of 4 positions shown; findings below may reference images not displayed]

FINDINGS: No evidence of acute fracture, subluxation or dislocation
identified.

No radio-opaque foreign bodies are present.

No focal bony lesions are noted.

The joint spaces are unremarkable.
IMPRESSION: Unremarkable left wrist.

## 2012-09-28 ENCOUNTER — Emergency Department (HOSPITAL_COMMUNITY)
Admission: EM | Admit: 2012-09-28 | Discharge: 2012-09-28 | Disposition: A | Discharge status: Home / Self Care | Payer: PRIVATE HEALTH INSURANCE | Attending: Emergency Medicine | Admitting: Emergency Medicine

## 2012-09-28 ENCOUNTER — Encounter (HOSPITAL_COMMUNITY): Payer: Self-pay | Admitting: Emergency Medicine

## 2012-09-28 DIAGNOSIS — R197 Diarrhea, unspecified: Secondary | ICD-10-CM | POA: Insufficient documentation

## 2012-09-28 DIAGNOSIS — K529 Noninfective gastroenteritis and colitis, unspecified: Secondary | ICD-10-CM

## 2012-09-28 DIAGNOSIS — Z3202 Encounter for pregnancy test, result negative: Secondary | ICD-10-CM | POA: Insufficient documentation

## 2012-09-28 DIAGNOSIS — Z9851 Tubal ligation status: Secondary | ICD-10-CM | POA: Insufficient documentation

## 2012-09-28 DIAGNOSIS — E860 Dehydration: Secondary | ICD-10-CM

## 2012-09-28 DIAGNOSIS — K5289 Other specified noninfective gastroenteritis and colitis: Secondary | ICD-10-CM | POA: Insufficient documentation

## 2012-09-28 DIAGNOSIS — R509 Fever, unspecified: Secondary | ICD-10-CM | POA: Insufficient documentation

## 2012-09-28 DIAGNOSIS — Z8719 Personal history of other diseases of the digestive system: Secondary | ICD-10-CM | POA: Insufficient documentation

## 2012-09-28 HISTORY — DX: Gastro-esophageal reflux disease without esophagitis: K21.9

## 2012-09-28 HISTORY — DX: Abnormal levels of other serum enzymes: R74.8

## 2012-09-28 HISTORY — DX: Reserved for inherently not codable concepts without codable children: IMO0001

## 2012-09-28 LAB — COMPREHENSIVE METABOLIC PANEL
ALT: 86 U/L — ABNORMAL HIGH (ref 0–35)
AST: 73 U/L — ABNORMAL HIGH (ref 0–37)
Albumin: 4 g/dL (ref 3.5–5.2)
Alkaline Phosphatase: 88 U/L (ref 39–117)
BUN: 10 mg/dL (ref 6–23)
CO2: 21 mEq/L (ref 19–32)
Calcium: 9 mg/dL (ref 8.4–10.5)
Chloride: 100 mEq/L (ref 96–112)
Creatinine, Ser: 0.49 mg/dL — ABNORMAL LOW (ref 0.50–1.10)
GFR calc Af Amer: >90 mL/min (ref >90)
GFR calc non Af Amer: >90 mL/min (ref >90)
Glucose, Bld: 132 mg/dL — ABNORMAL HIGH (ref 70–99)
Potassium: 3.1 mEq/L — ABNORMAL LOW (ref 3.5–5.1)
Sodium: 135 mEq/L (ref 135–145)
Total Bilirubin: 0.6 mg/dL (ref 0.3–1.2)
Total Protein: 8.5 g/dL — ABNORMAL HIGH (ref 6.0–8.3)

## 2012-09-28 LAB — URINALYSIS, ROUTINE W REFLEX MICROSCOPIC
Bilirubin Urine: NEGATIVE
Glucose, UA: NEGATIVE mg/dL
Hgb urine dipstick: NEGATIVE
Ketones, ur: NEGATIVE mg/dL
Leukocytes, UA: NEGATIVE
Nitrite: NEGATIVE
Protein, ur: NEGATIVE mg/dL
Specific Gravity, Urine: 1.031 — ABNORMAL HIGH (ref 1.005–1.030)
Urobilinogen, UA: 0.2 mg/dL (ref 0.0–1.0)
pH: 5 (ref 5.0–8.0)

## 2012-09-28 LAB — CBC WITH DIFFERENTIAL/PLATELET
Basophils Absolute: 0 10*3/uL (ref 0.0–0.1)
Basophils Relative: 0 % (ref 0–1)
Eosinophils Absolute: 0.1 10*3/uL (ref 0.0–0.7)
Eosinophils Relative: 1 % (ref 0–5)
HCT: 37.3 % (ref 36.0–46.0)
Hemoglobin: 12.1 g/dL (ref 12.0–15.0)
Lymphocytes Relative: 16 % (ref 12–46)
Lymphs Abs: 1 10*3/uL (ref 0.7–4.0)
MCH: 26 pg (ref 26.0–34.0)
MCHC: 32.4 g/dL (ref 30.0–36.0)
MCV: 80 fL (ref 78.0–100.0)
Monocytes Absolute: 0.3 10*3/uL (ref 0.1–1.0)
Monocytes Relative: 4 % (ref 3–12)
Neutro Abs: 4.6 10*3/uL (ref 1.7–7.7)
Neutrophils Relative %: 78 % — ABNORMAL HIGH (ref 43–77)
Platelets: 168 10*3/uL (ref 150–400)
RBC: 4.66 MIL/uL (ref 3.87–5.11)
RDW: 14.5 % (ref 11.5–15.5)
WBC: 5.9 10*3/uL (ref 4.0–10.5)

## 2012-09-28 LAB — LIPASE, BLOOD: Lipase: 18 U/L (ref 11–59)

## 2012-09-28 LAB — PREGNANCY, URINE: Preg Test, Ur: NEGATIVE

## 2012-09-28 MED ORDER — SODIUM CHLORIDE 0.9 % IV BOLUS (SEPSIS)
2000.0000 mL | Freq: Once | INTRAVENOUS | Status: AC
  Administered 2012-09-28: 1000 mL via INTRAVENOUS

## 2012-09-28 MED ORDER — POTASSIUM CHLORIDE CRYS ER 20 MEQ PO TBCR
40.0000 meq | EXTENDED_RELEASE_TABLET | Freq: Once | ORAL | Status: AC
  Administered 2012-09-28: 40 meq via ORAL
  Filled 2012-09-28: qty 2

## 2012-09-28 MED ORDER — KETOROLAC TROMETHAMINE 30 MG/ML IJ SOLN
30.0000 mg | Freq: Once | INTRAMUSCULAR | Status: AC
  Administered 2012-09-28: 30 mg via INTRAVENOUS
  Filled 2012-09-28: qty 1

## 2012-09-28 NOTE — ED Provider Notes (Signed)
History     CSN: [id number]  Arrival date & time 09/28/12  1547   First MD Initiated Contact with Patient 09/28/12 1617      Chief Complaint  Patient presents with  . Abdominal Pain    (Consider location/radiation/quality/duration/timing/severity/associated sxs/prior treatment) The history is provided by the patient.  Angela Castillo is a 36 y.o. female history of reflux here presenting with normal pain and diarrhea. She noticed diffuse abdominal pain starting yesterday. She also had 5-6 episodes of diarrhea since this morning. No nausea or vomiting. He noticed some low-grade temperatures at home and has some myalgias. Denies chest pain or shortness of breath or cough. Denies recent travel. LMP a month ago and had tubal ligation in the past.     Past Medical History  Diagnosis Date  . Reflux   . Elevated liver enzymes     Past Surgical History  Procedure Date  . Tubal ligation     No family history on file.  History  Substance Use Topics  . Smoking status: Never Smoker   . Smokeless tobacco: Not on file  . Alcohol Use: No    OB History    Grav Para Term Preterm Abortions TAB SAB Ect Mult Living                  Review of Systems  Constitutional: Positive for fever and chills.  Gastrointestinal: Positive for abdominal pain and diarrhea.  All other systems reviewed and are negative.    Allergies  Review of patient's allergies indicates no known allergies.  Home Medications   Current Outpatient Rx  Name  Route  Sig  Dispense  Refill  . LOPERAMIDE HCL 2 MG PO CAPS   Oral   Take 2 mg by mouth 4 (four) times daily as needed. Loose stool         . ALEVE PO   Oral   Take 220 mg by mouth every 8 (eight) hours as needed. pain           BP 152/89  Pulse 111  Temp 99.6 F (37.6 C) (Oral)  Resp 20  Wt 235 lb (106.595 kg)  SpO2 98%  LMP 08/24/2012  Physical Exam  Nursing note and vitals reviewed. Constitutional: She is oriented to person,  place, and time. She appears well-developed and well-nourished.       Uncomfortable   HENT:  Head: Normocephalic.       MM slightly dry   Eyes: Conjunctivae normal are normal. Pupils are equal, round, and reactive to light.  Neck: Normal range of motion. Neck supple.  Cardiovascular: Regular rhythm and normal heart sounds.        Tachycardic   Pulmonary/Chest: Effort normal and breath sounds normal. No respiratory distress. She has no wheezes. She has no rales.  Abdominal: Soft. Bowel sounds are normal. She exhibits no distension. There is no tenderness. There is no rebound.       Obese   Musculoskeletal: Normal range of motion. She exhibits no edema and no tenderness.  Neurological: She is alert and oriented to person, place, and time.  Skin: Skin is warm and dry.  Psychiatric: She has a normal mood and affect. Her behavior is normal. Judgment and thought content normal.    ED Course  Procedures (including critical care time)  Labs Reviewed  CBC WITH DIFFERENTIAL - Abnormal; Notable for the following:    Neutrophils Relative 78 (*)     All other components within normal  limits  COMPREHENSIVE METABOLIC PANEL - Abnormal; Notable for the following:    Potassium 3.1 (*)     Glucose, Bld 132 (*)     Creatinine, Ser 0.49 (*)     Total Protein 8.5 (*)     AST 73 (*)     ALT 86 (*)     All other components within normal limits  URINALYSIS, ROUTINE W REFLEX MICROSCOPIC - Abnormal; Notable for the following:    Color, Urine AMBER (*)  BIOCHEMICALS MAY BE AFFECTED BY COLOR   APPearance CLOUDY (*)     Specific Gravity, Urine 1.031 (*)     All other components within normal limits  LIPASE, BLOOD  PREGNANCY, URINE   No results found.   No diagnosis found.    MDM  Angela Castillo is a 35 y.o. female here with abdominal pain, diarrhea. Likely gastroenteritis. Will get labs, lipase, give IVF and reassess.   7:39 PM Feels better after IVF. K 3.1, supplemented in the ED.  Recommend hydration and imodium prn.        Richardean Canal, MD 09/28/12 [phone]

## 2012-09-28 NOTE — ED Notes (Signed)
Patient with upper abdominal pain which started yesterday afternoon.  Pt reports diarrhea started early this morning a total of 5-6 episodes. Pt also reports low grade fever and aches and chills.  Pt had nausea this morning but has not vomited.

## 2013-09-12 ENCOUNTER — Emergency Department (HOSPITAL_COMMUNITY)
Admission: EM | Admit: 2013-09-12 | Discharge: 2013-09-12 | Disposition: A | Discharge status: Home / Self Care | Payer: PRIVATE HEALTH INSURANCE | Attending: Emergency Medicine | Admitting: Emergency Medicine

## 2013-09-12 ENCOUNTER — Emergency Department (HOSPITAL_COMMUNITY): Payer: PRIVATE HEALTH INSURANCE

## 2013-09-12 ENCOUNTER — Encounter (HOSPITAL_COMMUNITY): Payer: Self-pay | Admitting: Emergency Medicine

## 2013-09-12 DIAGNOSIS — Z8719 Personal history of other diseases of the digestive system: Secondary | ICD-10-CM | POA: Insufficient documentation

## 2013-09-12 DIAGNOSIS — R079 Chest pain, unspecified: Secondary | ICD-10-CM

## 2013-09-12 LAB — COMPREHENSIVE METABOLIC PANEL
ALT: 68 U/L — ABNORMAL HIGH (ref 0–35)
AST: 57 U/L — ABNORMAL HIGH (ref 0–37)
Albumin: 3.8 g/dL (ref 3.5–5.2)
Alkaline Phosphatase: 90 U/L (ref 39–117)
BUN: 7 mg/dL (ref 6–23)
CO2: 20 mEq/L (ref 19–32)
Calcium: 9.1 mg/dL (ref 8.4–10.5)
Chloride: 104 mEq/L (ref 96–112)
Creatinine, Ser: 0.48 mg/dL — ABNORMAL LOW (ref 0.50–1.10)
GFR calc Af Amer: >90 mL/min (ref >90)
GFR calc non Af Amer: >90 mL/min (ref >90)
Glucose, Bld: 65 mg/dL — ABNORMAL LOW (ref 70–99)
Potassium: 3.5 mEq/L (ref 3.5–5.1)
Sodium: 139 mEq/L (ref 135–145)
Total Bilirubin: 0.3 mg/dL (ref 0.3–1.2)
Total Protein: 8.6 g/dL — ABNORMAL HIGH (ref 6.0–8.3)

## 2013-09-12 LAB — CBC WITH DIFFERENTIAL/PLATELET
Basophils Absolute: 0 10*3/uL (ref 0.0–0.1)
Basophils Relative: 0 % (ref 0–1)
Eosinophils Absolute: 0.2 10*3/uL (ref 0.0–0.7)
Eosinophils Relative: 2 % (ref 0–5)
HCT: 37.4 % (ref 36.0–46.0)
Hemoglobin: 11.8 g/dL — ABNORMAL LOW (ref 12.0–15.0)
Lymphocytes Relative: 31 % (ref 12–46)
Lymphs Abs: 2.1 10*3/uL (ref 0.7–4.0)
MCH: 26 pg (ref 26.0–34.0)
MCHC: 31.6 g/dL (ref 30.0–36.0)
MCV: 82.4 fL (ref 78.0–100.0)
Monocytes Absolute: 0.4 10*3/uL (ref 0.1–1.0)
Monocytes Relative: 5 % (ref 3–12)
Neutro Abs: 4.2 10*3/uL (ref 1.7–7.7)
Neutrophils Relative %: 62 % (ref 43–77)
Platelets: 158 10*3/uL (ref 150–400)
RBC: 4.54 MIL/uL (ref 3.87–5.11)
RDW: 14.8 % (ref 11.5–15.5)
WBC: 6.9 10*3/uL (ref 4.0–10.5)

## 2013-09-12 LAB — POCT I-STAT TROPONIN I: Troponin i, poc: 0 ng/mL (ref 0.00–0.08)

## 2013-09-12 IMAGING — CR DG CHEST 2V
2 series · 2 of 2 positions shown · non-contrast
Comparison: [DATE]

CLINICAL DATA: Chest pain, left rib pain

EXAM:
CHEST  2 VIEW

[w chest pa]
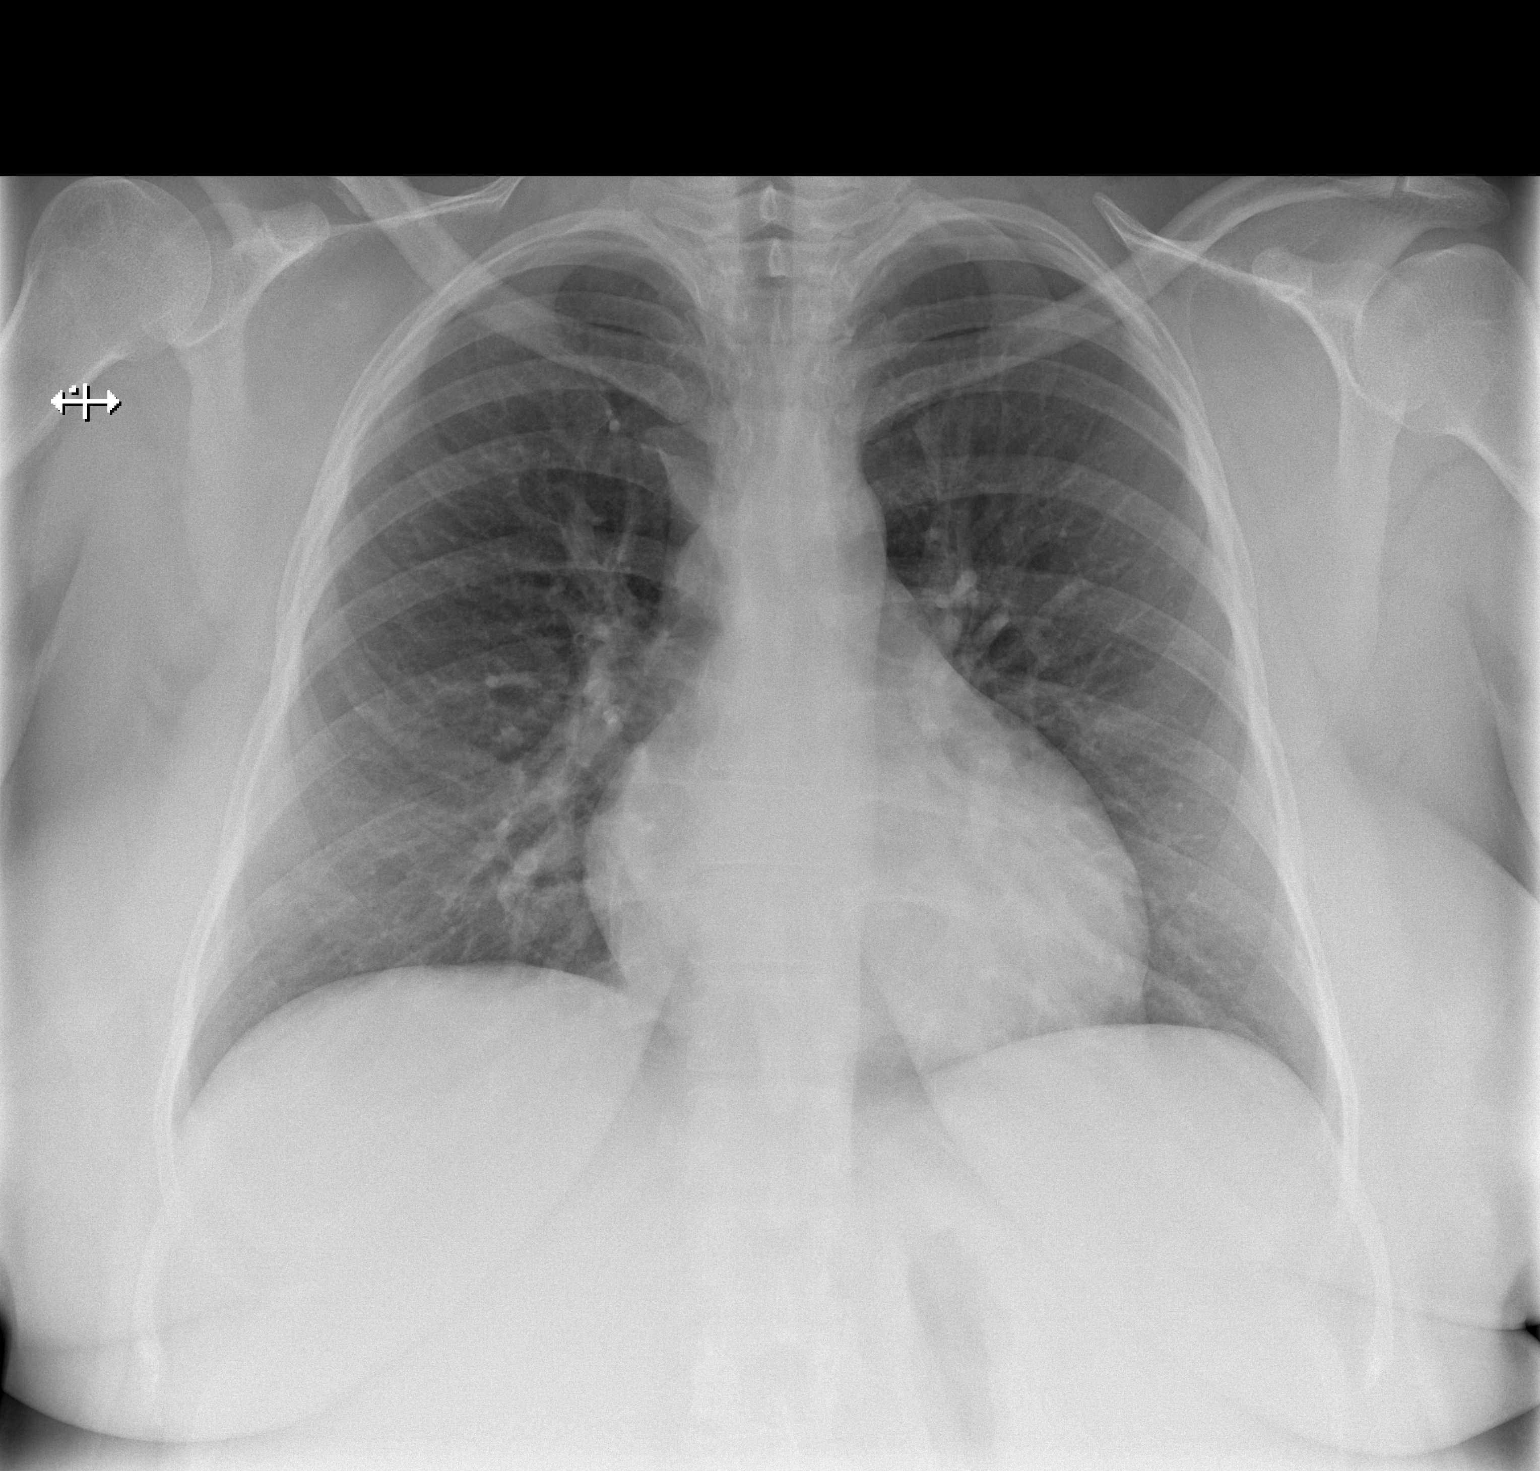

[w chest lat]
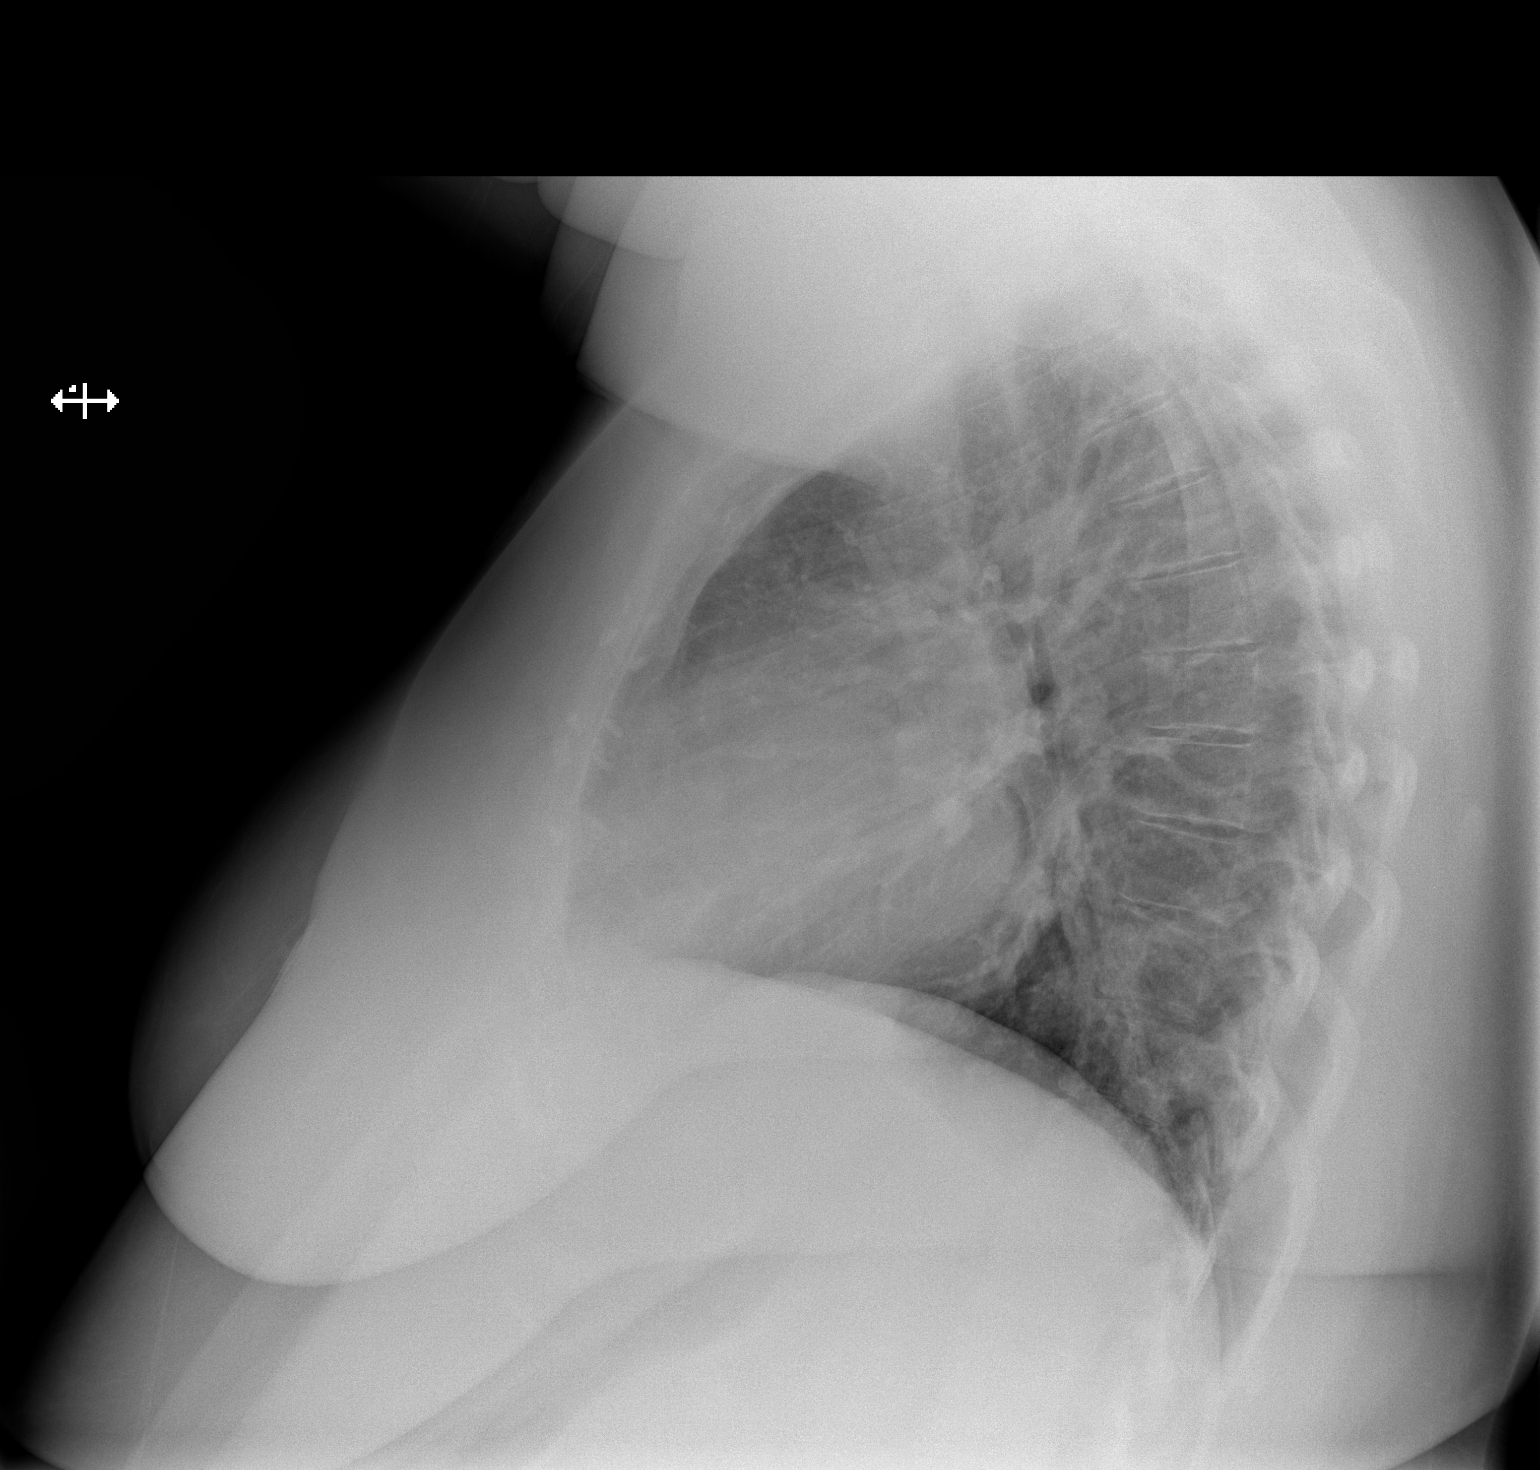

[2 of 2 positions shown; findings below may reference images not displayed]

FINDINGS: Cardiomediastinal silhouette is stable. No acute infiltrate or
pleural effusion. No pulmonary edema. Bony thorax is unremarkable.
IMPRESSION: No active cardiopulmonary disease.

## 2013-09-12 NOTE — ED Provider Notes (Signed)
CSN: [id number]     Arrival date & time 09/12/13  1439 History   First MD Initiated Contact with Patient 09/12/13 1645     Chief Complaint  Patient presents with  . Chest Pain   (Consider location/radiation/quality/duration/timing/severity/associated sxs/prior Treatment) HPI Comments: Patient is a otherwise healthy 37 year old female presents to the emergency department with complaints of discomfort in the left upper chest. She works as a Scientist, water quality at [employer] stated that she walked from the break room when her symptoms began. She said it is felt tight in the left upper chest just below her collarbone. She denies any pleuritic component and denies any shortness of breath, diaphoresis, nausea, or radiation to the arm or jaw. She denies any recent exertional symptoms. She has no prior cardiac history. She did take some ibuprofen earlier in the day which make the symptoms go away but returned when this wore off.  Patient is a 37 y.o. female presenting with chest pain. The history is provided by the patient.  Chest Pain Chest pain location: Left upper chest. Pain quality: tightness   Pain radiates to:  Does not radiate Pain radiates to the back: no   Pain severity:  Mild Onset quality:  Sudden Duration:  8 hours Timing:  Constant Progression:  Partially resolved Chronicity:  New Context: not breathing, not lifting, no movement and no trauma   Relieved by: Ibuprofen. Worsened by:  Nothing tried Ineffective treatments:  None tried Associated symptoms: no abdominal pain   Risk factors: no coronary artery disease     Past Medical History  Diagnosis Date  . Reflux   . Elevated liver enzymes    Past Surgical History  Procedure Laterality Date  . Tubal ligation     No family history on file. History  Substance Use Topics  . Smoking status: Never Smoker   . Smokeless tobacco: Not on file  . Alcohol Use: No   OB History   Grav Para Term Preterm Abortions TAB SAB Ect Mult Living               Review of Systems  Cardiovascular: Positive for chest pain.  Gastrointestinal: Negative for abdominal pain.  All other systems reviewed and are negative.    Allergies  Review of patient's allergies indicates no known allergies.  Home Medications  No current outpatient prescriptions on file. BP 162/96  Pulse 84  Temp(Src) 98.7 F (37.1 C) (Oral)  Resp 19  Wt 237 lb 12.8 oz (107.865 kg)  SpO2 100% Physical Exam  Nursing note and vitals reviewed. Constitutional: She is oriented to person, place, and time. She appears well-developed and well-nourished. No distress.  HENT:  Head: Normocephalic and atraumatic.  Neck: Normal range of motion. Neck supple.  Cardiovascular: Normal rate and regular rhythm.  Exam reveals no gallop and no friction rub.   No murmur heard. Pulmonary/Chest: Effort normal and breath sounds normal. No respiratory distress. She has no wheezes.  Abdominal: Soft. Bowel sounds are normal. She exhibits no distension. There is no tenderness.  Musculoskeletal: Normal range of motion.  Neurological: She is alert and oriented to person, place, and time.  Skin: Skin is warm and dry. She is not diaphoretic.    ED Course  Procedures (including critical care time) Labs Review Labs Reviewed  COMPREHENSIVE METABOLIC PANEL - Abnormal; Notable for the following:    Glucose, Bld 65 (*)    Creatinine, Ser 0.48 (*)    Total Protein 8.6 (*)    AST 57 (*)  ALT 68 (*)    All other components within normal limits  CBC WITH DIFFERENTIAL - Abnormal; Notable for the following:    Hemoglobin 11.8 (*)    All other components within normal limits  POCT I-STAT TROPONIN I   Imaging Review Dg Chest 2 View  09/12/2013   CLINICAL DATA:  Chest pain, left rib pain  EXAM: CHEST  2 VIEW  COMPARISON:  06/04/2010  FINDINGS: Cardiomediastinal silhouette is stable. No acute infiltrate or pleural effusion. No pulmonary edema. Bony thorax is unremarkable.  IMPRESSION: No  active cardiopulmonary disease.   Electronically Signed   By: Lahoma Crocker M.D.   On: 09/12/2013 15:40    EKG Interpretation     Ventricular Rate:  85 PR Interval:  142 QRS Duration: 92 QT Interval:  398 QTC Calculation: 473 R Axis:   33 Text Interpretation:  Normal sinus rhythm Nonspecific T wave abnormality Abnormal ECG            MDM  No diagnosis found. Patient is a 37 year old female with no cardiac risk factors who presents with symptoms that are atypical for cardiac pain. Her EKG shows nonspecific T wave abnormalities which are entirely unchanged from an EKG from August of 2011. The troponin today after approximately 6 hours worth of discomfort is negative and chest x-ray is clear. I do not feel as though this is cardiac in nature. She will be discharged to home with instructions to take ibuprofen 600 mg every 6 hours as needed for discomfort. She is to return to the emergency department if her symptoms worsen or change. If she is not improving in the next 2-3 days I will provide her with a followup information for cardiology. She can schedule an appointment to discuss a possible stress test however I doubt it will comment to this.    Veryl Speak, MD 09/12/13 [phone]

## 2013-09-12 NOTE — ED Notes (Signed)
Dr. Delo at bedside. 

## 2013-09-12 NOTE — ED Notes (Signed)
Pt with acute onset L chest pain at 0745 this am while on break at work.  C/o L upper side pain, intermittent throughout the week.  Denies sob, nausea or diarrhea. Hx of GERD.

## 2014-06-19 ENCOUNTER — Encounter (HOSPITAL_COMMUNITY): Payer: Self-pay | Admitting: Emergency Medicine

## 2014-06-19 ENCOUNTER — Emergency Department (HOSPITAL_COMMUNITY)
Admission: EM | Admit: 2014-06-19 | Discharge: 2014-06-19 | Disposition: A | Discharge status: Home / Self Care | Payer: PRIVATE HEALTH INSURANCE | Attending: Emergency Medicine | Admitting: Emergency Medicine

## 2014-06-19 DIAGNOSIS — R071 Chest pain on breathing: Secondary | ICD-10-CM | POA: Insufficient documentation

## 2014-06-19 DIAGNOSIS — Z8719 Personal history of other diseases of the digestive system: Secondary | ICD-10-CM | POA: Insufficient documentation

## 2014-06-19 DIAGNOSIS — R0789 Other chest pain: Secondary | ICD-10-CM

## 2014-06-19 DIAGNOSIS — R079 Chest pain, unspecified: Secondary | ICD-10-CM | POA: Insufficient documentation

## 2014-06-19 LAB — BASIC METABOLIC PANEL
Anion gap: 11 (ref 5–15)
BUN: 9 mg/dL (ref 6–23)
CO2: 24 mEq/L (ref 19–32)
Calcium: 9.3 mg/dL (ref 8.4–10.5)
Chloride: 104 mEq/L (ref 96–112)
Creatinine, Ser: 0.58 mg/dL (ref 0.50–1.10)
GFR calc Af Amer: >90 mL/min (ref >90)
GFR calc non Af Amer: >90 mL/min (ref >90)
Glucose, Bld: 115 mg/dL — ABNORMAL HIGH (ref 70–99)
Potassium: 4.1 mEq/L (ref 3.7–5.3)
Sodium: 139 mEq/L (ref 137–147)

## 2014-06-19 LAB — CBC
HCT: 37.9 % (ref 36.0–46.0)
Hemoglobin: 11.6 g/dL — ABNORMAL LOW (ref 12.0–15.0)
MCH: 25.2 pg — ABNORMAL LOW (ref 26.0–34.0)
MCHC: 30.6 g/dL (ref 30.0–36.0)
MCV: 82.2 fL (ref 78.0–100.0)
Platelets: 130 10*3/uL — ABNORMAL LOW (ref 150–400)
RBC: 4.61 MIL/uL (ref 3.87–5.11)
RDW: 15.1 % (ref 11.5–15.5)
WBC: 7.1 10*3/uL (ref 4.0–10.5)

## 2014-06-19 NOTE — ED Provider Notes (Signed)
CSN: [id number]     Arrival date & time 06/19/14  3220 History   First MD Initiated Contact with Patient 06/19/14 1910     Chief Complaint  Patient presents with  . Chest Pain     (Consider location/radiation/quality/duration/timing/severity/associated sxs/prior Treatment) Patient is a 38 y.o. female presenting with chest pain. The history is provided by the patient.  Chest Pain Pain location:  R chest Pain quality: sharp   Pain radiates to:  Does not radiate Pain radiates to the back: no   Pain severity:  Moderate Onset quality:  Unable to specify Timing:  Constant Progression:  Unchanged Chronicity:  New Context: not breathing and not raising an arm   Relieved by:  Nothing Worsened by:  Deep breathing and certain positions (palpation) Associated symptoms: no abdominal pain, no back pain, no cough, no diaphoresis, no dizziness, no fever, no headache, no heartburn, no lower extremity edema, no palpitations and no shortness of breath   Risk factors: no aortic disease, no birth control, no coronary artery disease, no diabetes mellitus, no Ehlers-Danlos syndrome, no high cholesterol, no hypertension, no prior DVT/PE and no smoking     Past Medical History  Diagnosis Date  . Reflux   . Elevated liver enzymes    Past Surgical History  Procedure Laterality Date  . Tubal ligation     No family history on file. History  Substance Use Topics  . Smoking status: Never Smoker   . Smokeless tobacco: Not on file  . Alcohol Use: No   OB History   Grav Para Term Preterm Abortions TAB SAB Ect Mult Living                 Review of Systems  Constitutional: Negative for fever and diaphoresis.  Respiratory: Negative for cough and shortness of breath.   Cardiovascular: Positive for chest pain. Negative for palpitations.  Gastrointestinal: Negative for heartburn and abdominal pain.  Musculoskeletal: Negative for back pain.  Neurological: Negative for dizziness and headaches.  All  other systems reviewed and are negative.     Allergies  Review of patient's allergies indicates no known allergies.  Home Medications   Prior to Admission medications   Not on File   BP 166/88  Pulse 90  Temp(Src) 98.3 F (36.8 C) (Oral)  Resp 20  SpO2 100%  LMP 06/19/2014 Physical Exam  Nursing note and vitals reviewed. Constitutional: She is oriented to person, place, and time. She appears well-developed and well-nourished.  HENT:  Head: Normocephalic and atraumatic.  Right Ear: External ear normal.  Left Ear: External ear normal.  Nose: Nose normal.  Mouth/Throat: Oropharynx is clear and moist.  Eyes: Conjunctivae and EOM are normal. Pupils are equal, round, and reactive to light.  Neck: Normal range of motion. Neck supple. No JVD present. No tracheal deviation present. No thyromegaly present.  Cardiovascular: Normal rate, regular rhythm, normal heart sounds and intact distal pulses.   Pulmonary/Chest: Effort normal and breath sounds normal. No respiratory distress. She has no wheezes. She exhibits tenderness.    Abdominal: Soft. Bowel sounds are normal. She exhibits no mass. There is no tenderness. There is no guarding.  Musculoskeletal: Normal range of motion.  Lymphadenopathy:    She has no cervical adenopathy.  Neurological: She is alert and oriented to person, place, and time. She has normal reflexes. No cranial nerve deficit or sensory deficit. Gait normal. GCS eye subscore is 4. GCS verbal subscore is 5. GCS motor subscore is 6.  Reflex Scores:  Bicep reflexes are 2+ on the right side and 2+ on the left side.      Patellar reflexes are 2+ on the right side and 2+ on the left side. Strength is 5/5 bilateral elbow flexor/extensors, wrist extension/flexion, intrinsic hand strength equal Bilateral hip flexion/extension 5/5, knee flexion/extension 5/5, ankle 5/5 flexion extension    Skin: Skin is warm and dry.  Psychiatric: She has a normal mood and affect.  Her behavior is normal. Judgment and thought content normal.    ED Course  Procedures (including critical care time) Labs Review Labs Reviewed  [place], ED    Imaging Review No results found.   EKG Interpretation   Date/Time:  Sunday June 19 2014 19:00:32 EDT Ventricular Rate:  81 PR Interval:  127 QRS Duration: 89 QT Interval:  397 QTC Calculation: 461 R Axis:   35 Text Interpretation:  Normal sinus rhythm Baseline wander Confirmed by Nikodem Leadbetter  MD, Andee Poles [id number]) on 06/19/2014 7:29:30 PM      MDM   Final diagnoses:  Chest wall pain    38 year old female who presents with right-sided chest pain that is tender to palpation and increases with movement and is reproducible on exam. EKG done prior to my evaluation is normal. Patient is advised regarding nonsteroidals and return precautions and voices understanding.    Shaune Pollack, MD 06/20/14 [phone]

## 2014-06-19 NOTE — ED Notes (Signed)
Pt reports developing chest pain in the right chest that began yesterday. Pt reports she went to bed and awoke this morning with the same pain, pt states it did not keep her awake or suddenly awake her from sleep. Pt reports the pain is worse with movement, even steering her vehicle made it worse. Pt reports feeling shortness of breath over the past day. Pt denies nausea and emesis. Pt reports posterior neck and lower back pain, pt reports having arthritis in her neck.

## 2014-06-19 NOTE — Discharge Instructions (Signed)

## 2014-06-20 LAB — I-STAT TROPONIN, ED: Troponin i, poc: 0 ng/mL (ref 0.00–0.08)

## 2015-01-03 ENCOUNTER — Emergency Department (HOSPITAL_COMMUNITY): Payer: PRIVATE HEALTH INSURANCE

## 2015-01-03 ENCOUNTER — Observation Stay (HOSPITAL_COMMUNITY)
Admission: EM | Admit: 2015-01-03 | Discharge: 2015-01-04 | Disposition: A | Discharge status: Home / Self Care | Payer: PRIVATE HEALTH INSURANCE | Attending: Internal Medicine | Admitting: Internal Medicine

## 2015-01-03 ENCOUNTER — Other Ambulatory Visit (HOSPITAL_COMMUNITY): Payer: Self-pay

## 2015-01-03 ENCOUNTER — Encounter (HOSPITAL_COMMUNITY): Payer: Self-pay | Admitting: Emergency Medicine

## 2015-01-03 DIAGNOSIS — D509 Iron deficiency anemia, unspecified: Secondary | ICD-10-CM

## 2015-01-03 DIAGNOSIS — D696 Thrombocytopenia, unspecified: Secondary | ICD-10-CM

## 2015-01-03 DIAGNOSIS — R740 Nonspecific elevation of levels of transaminase and lactic acid dehydrogenase [LDH]: Secondary | ICD-10-CM | POA: Insufficient documentation

## 2015-01-03 DIAGNOSIS — R0609 Other forms of dyspnea: Secondary | ICD-10-CM

## 2015-01-03 DIAGNOSIS — Z23 Encounter for immunization: Secondary | ICD-10-CM | POA: Insufficient documentation

## 2015-01-03 DIAGNOSIS — R7401 Elevation of levels of liver transaminase levels: Secondary | ICD-10-CM | POA: Diagnosis present

## 2015-01-03 DIAGNOSIS — M791 Myalgia: Secondary | ICD-10-CM | POA: Insufficient documentation

## 2015-01-03 DIAGNOSIS — R0789 Other chest pain: Secondary | ICD-10-CM

## 2015-01-03 DIAGNOSIS — R739 Hyperglycemia, unspecified: Secondary | ICD-10-CM | POA: Insufficient documentation

## 2015-01-03 DIAGNOSIS — D649 Anemia, unspecified: Principal | ICD-10-CM | POA: Diagnosis present

## 2015-01-03 LAB — IRON AND TIBC
Iron: 11 ug/dL — ABNORMAL LOW (ref 42–145)
Saturation Ratios: 2 % — ABNORMAL LOW (ref 20–55)
TIBC: 476 ug/dL — ABNORMAL HIGH (ref 250–470)
UIBC: 465 ug/dL — ABNORMAL HIGH (ref 125–400)

## 2015-01-03 LAB — CBC WITH DIFFERENTIAL/PLATELET
Basophils Absolute: 0 10*3/uL (ref 0.0–0.1)
Basophils Relative: 0 % (ref 0–1)
Eosinophils Absolute: 0.1 10*3/uL (ref 0.0–0.7)
Eosinophils Relative: 2 % (ref 0–5)
HCT: 21.5 % — ABNORMAL LOW (ref 36.0–46.0)
Hemoglobin: 6.4 g/dL — CL (ref 12.0–15.0)
Lymphocytes Relative: 32 % (ref 12–46)
Lymphs Abs: 1.9 10*3/uL (ref 0.7–4.0)
MCH: 25.2 pg — ABNORMAL LOW (ref 26.0–34.0)
MCHC: 29.8 g/dL — ABNORMAL LOW (ref 30.0–36.0)
MCV: 84.6 fL (ref 78.0–100.0)
Monocytes Absolute: 0.4 10*3/uL (ref 0.1–1.0)
Monocytes Relative: 6 % (ref 3–12)
Neutro Abs: 3.6 10*3/uL (ref 1.7–7.7)
Neutrophils Relative %: 61 % (ref 43–77)
Platelets: 141 10*3/uL — ABNORMAL LOW (ref 150–400)
RBC: 2.54 MIL/uL — ABNORMAL LOW (ref 3.87–5.11)
RDW: 17 % — ABNORMAL HIGH (ref 11.5–15.5)
WBC: 6 10*3/uL (ref 4.0–10.5)

## 2015-01-03 LAB — URINALYSIS, ROUTINE W REFLEX MICROSCOPIC
Bilirubin Urine: NEGATIVE
Glucose, UA: NEGATIVE mg/dL
Hgb urine dipstick: NEGATIVE
Ketones, ur: NEGATIVE mg/dL
Leukocytes, UA: NEGATIVE
Nitrite: NEGATIVE
Protein, ur: NEGATIVE mg/dL
Specific Gravity, Urine: 1.024 (ref 1.005–1.030)
Urobilinogen, UA: 1 mg/dL (ref 0.0–1.0)
pH: 5.5 (ref 5.0–8.0)

## 2015-01-03 LAB — SEDIMENTATION RATE: Sed Rate: 48 mm/hr — ABNORMAL HIGH (ref 0–22)

## 2015-01-03 LAB — CK: Total CK: 87 U/L (ref 7–177)

## 2015-01-03 LAB — COMPREHENSIVE METABOLIC PANEL
ALT: 40 U/L — ABNORMAL HIGH (ref 0–35)
AST: 43 U/L — ABNORMAL HIGH (ref 0–37)
Albumin: 3.7 g/dL (ref 3.5–5.2)
Alkaline Phosphatase: 79 U/L (ref 39–117)
Anion gap: 6 (ref 5–15)
BUN: 15 mg/dL (ref 6–23)
CO2: 23 mmol/L (ref 19–32)
Calcium: 8.7 mg/dL (ref 8.4–10.5)
Chloride: 114 mmol/L — ABNORMAL HIGH (ref 96–112)
Creatinine, Ser: 0.47 mg/dL — ABNORMAL LOW (ref 0.50–1.10)
GFR calc Af Amer: >90 mL/min (ref >90)
GFR calc non Af Amer: >90 mL/min (ref >90)
Glucose, Bld: 105 mg/dL — ABNORMAL HIGH (ref 70–99)
Potassium: 3.6 mmol/L (ref 3.5–5.1)
Sodium: 143 mmol/L (ref 135–145)
Total Bilirubin: 0.4 mg/dL (ref 0.3–1.2)
Total Protein: 7.8 g/dL (ref 6.0–8.3)

## 2015-01-03 LAB — I-STAT TROPONIN, ED: Troponin i, poc: 0 ng/mL (ref 0.00–0.08)

## 2015-01-03 LAB — VITAMIN B12: Vitamin B-12: 427 pg/mL (ref 211–911)

## 2015-01-03 LAB — RETICULOCYTES
RBC.: 2.75 MIL/uL — ABNORMAL LOW (ref 3.87–5.11)
Retic Count, Absolute: 132 10*3/uL (ref 19.0–186.0)
Retic Ct Pct: 4.8 % — ABNORMAL HIGH (ref 0.4–3.1)

## 2015-01-03 LAB — FOLATE: Folate: 15.1 ng/mL

## 2015-01-03 LAB — POC OCCULT BLOOD, ED: Fecal Occult Bld: NEGATIVE

## 2015-01-03 LAB — TECHNOLOGIST SMEAR REVIEW

## 2015-01-03 LAB — LACTATE DEHYDROGENASE: LDH: 138 U/L (ref 94–250)

## 2015-01-03 LAB — FERRITIN: Ferritin: 12 ng/mL (ref 10–291)

## 2015-01-03 LAB — LIPASE, BLOOD: Lipase: 26 U/L (ref 11–59)

## 2015-01-03 LAB — BRAIN NATRIURETIC PEPTIDE: B Natriuretic Peptide: 41.3 pg/mL (ref 0.0–100.0)

## 2015-01-03 LAB — ABO/RH: ABO/RH(D): O POS

## 2015-01-03 LAB — PREPARE RBC (CROSSMATCH)

## 2015-01-03 IMAGING — CR DG CHEST 2V
2 series · 2 of 2 positions shown · non-contrast
Comparison: [DATE]

CLINICAL DATA: Exertional dyspnea and mid portion chest pain for 4
days

EXAM:
CHEST  2 VIEW

[w chest pa]
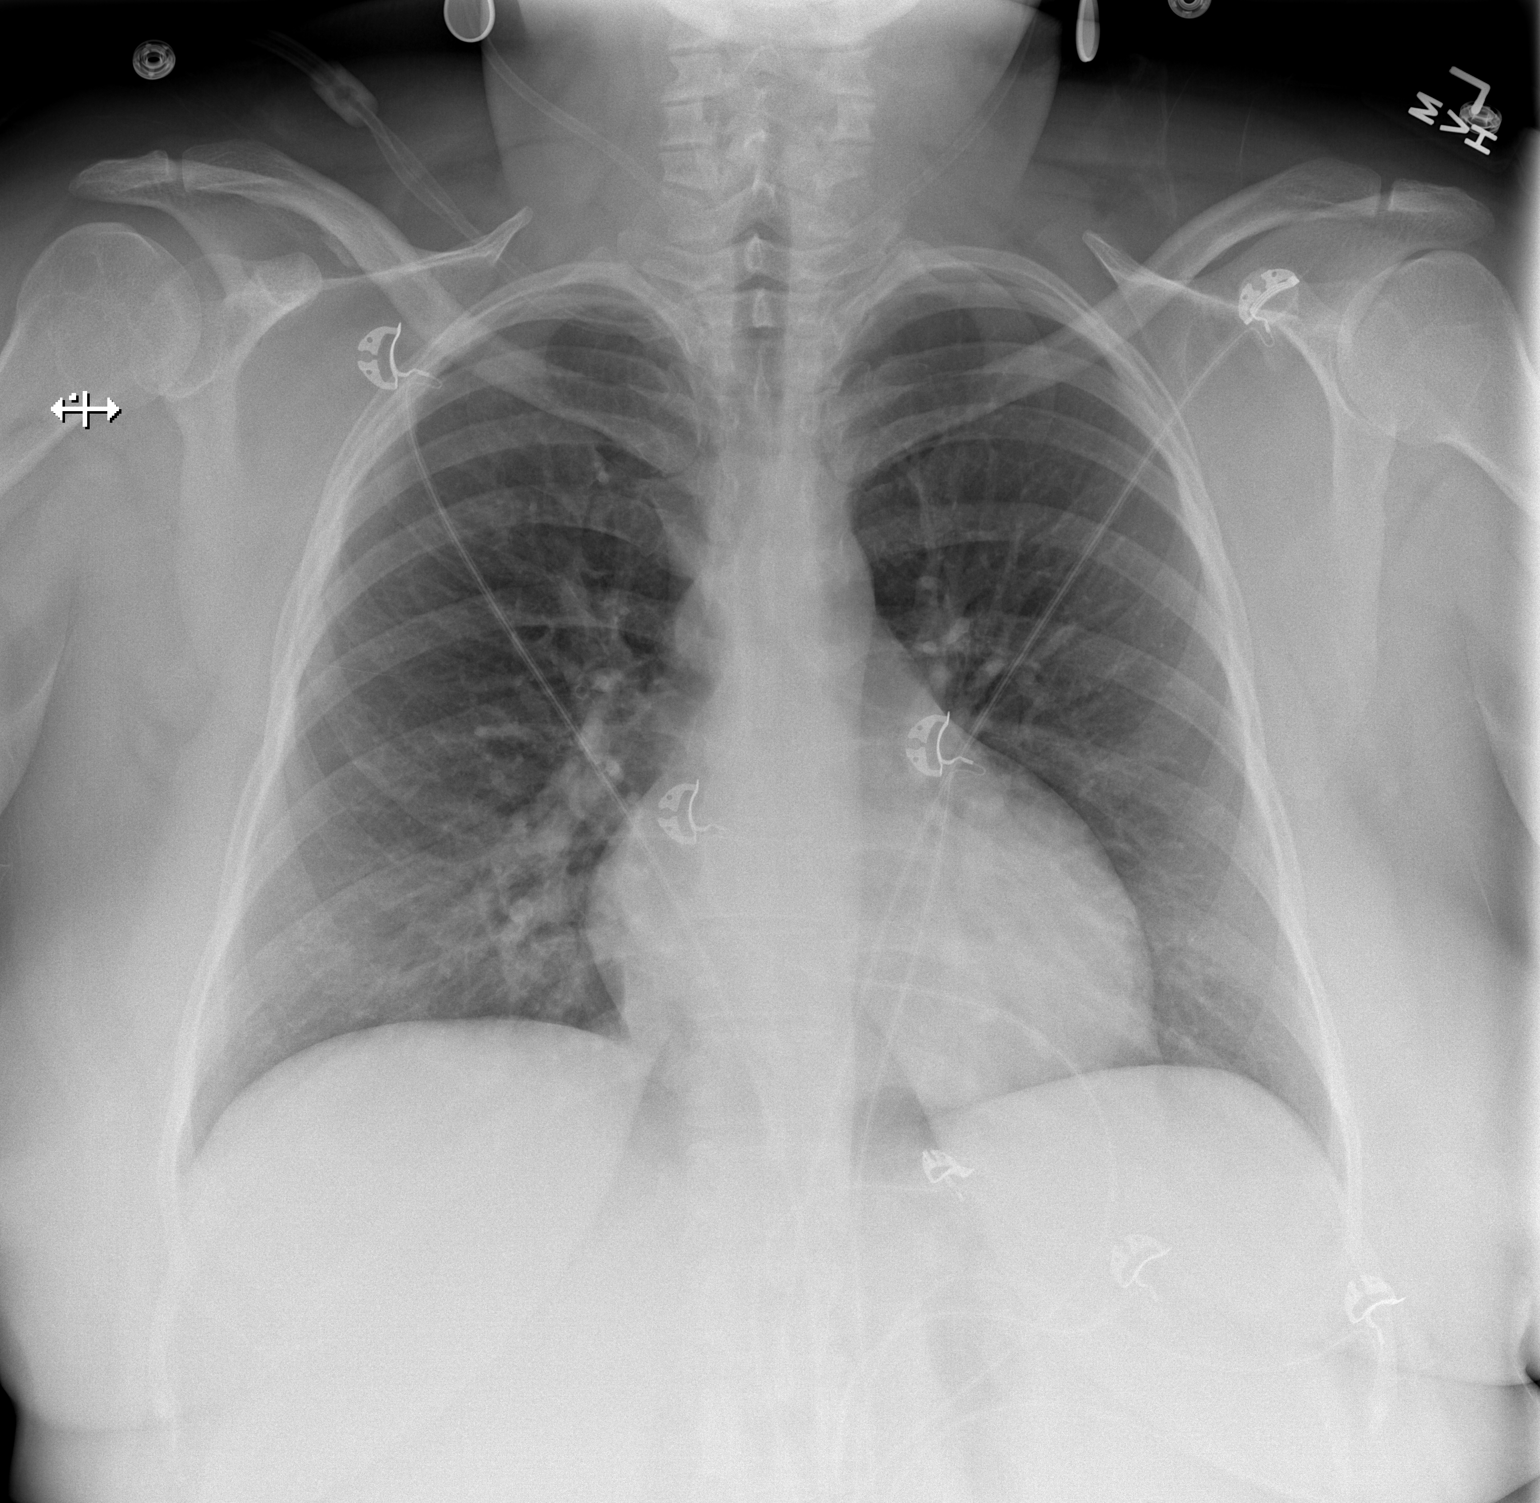

[w chest lat]
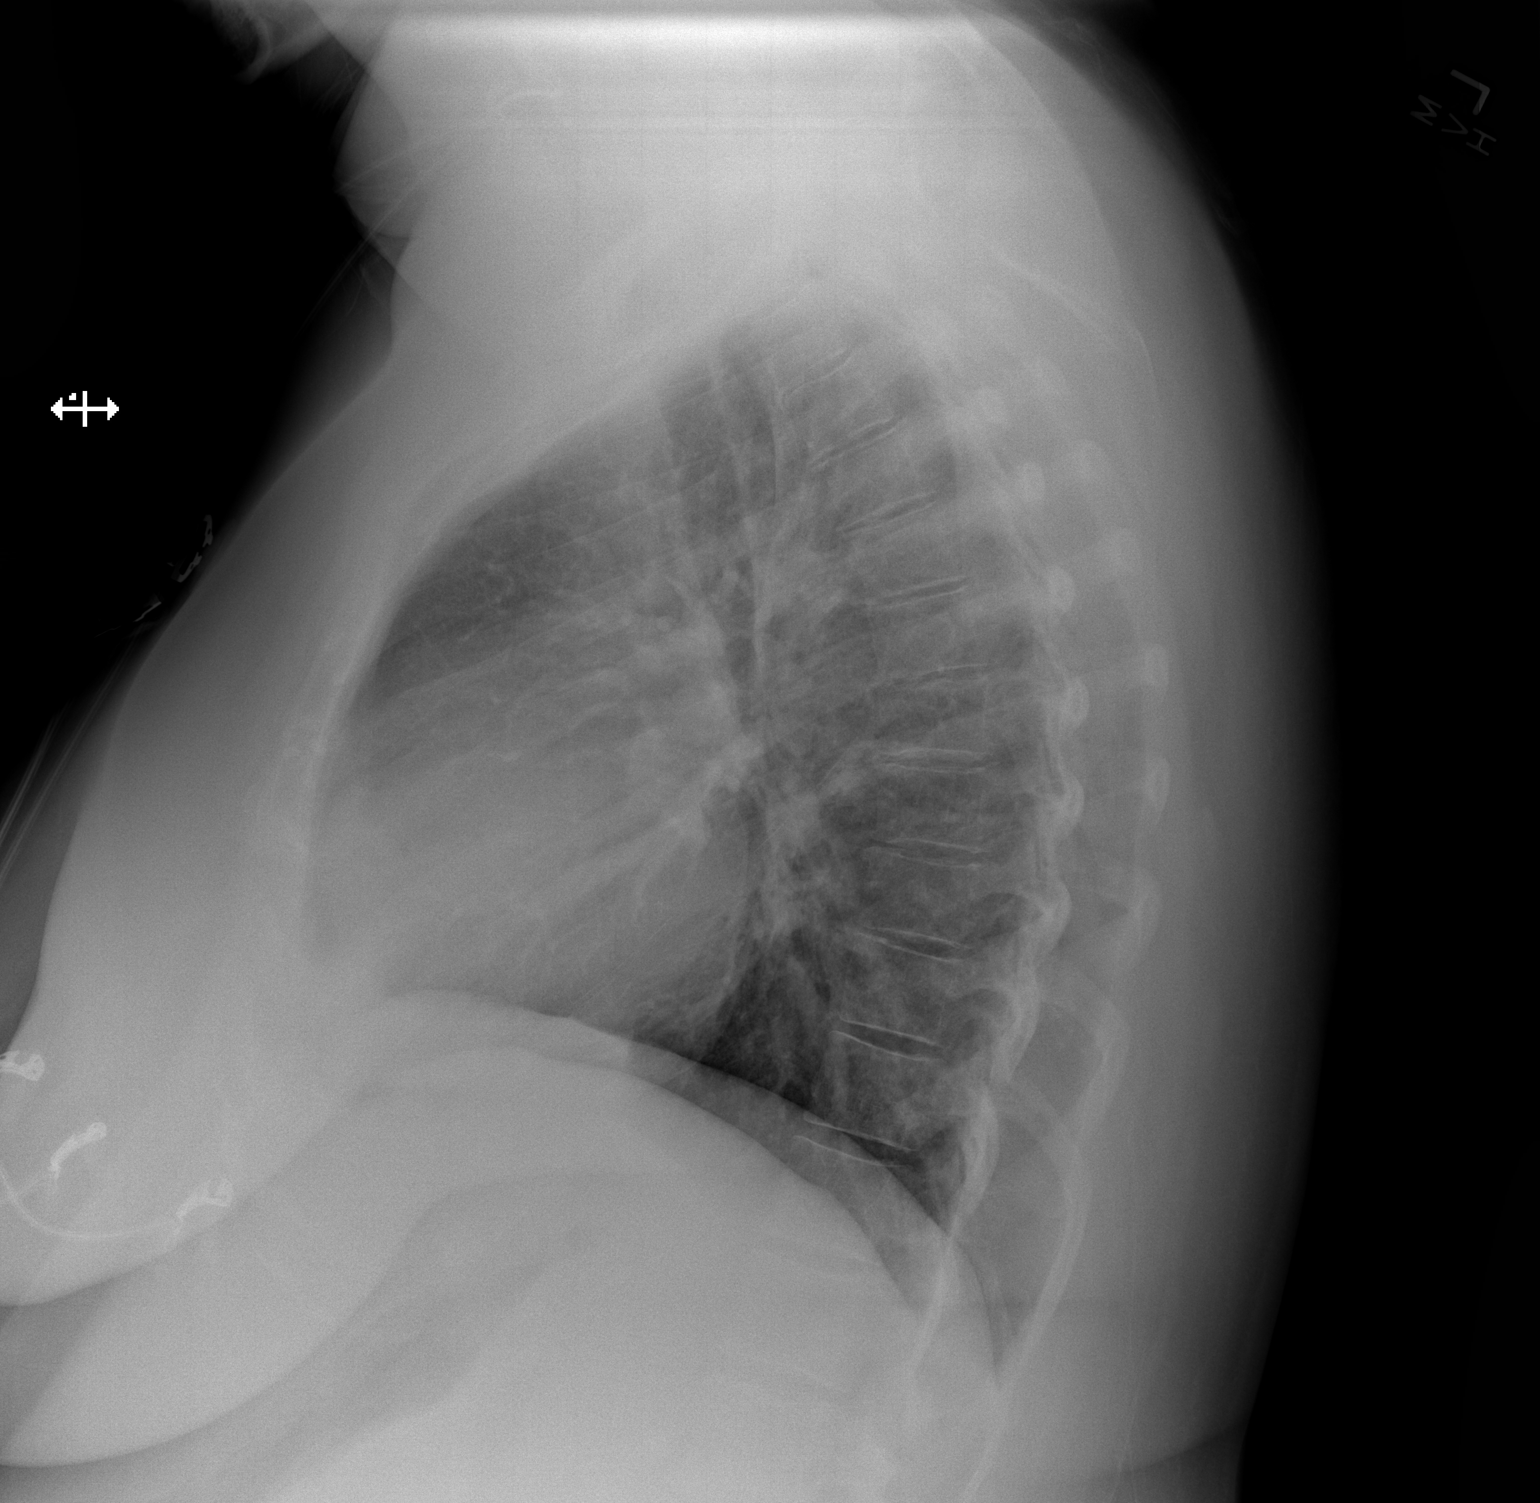

[2 of 2 positions shown; findings below may reference images not displayed]

FINDINGS: Lungs are clear. Heart size and pulmonary vascularity are normal. No
adenopathy. No pneumothorax. No bone lesions.
IMPRESSION: No edema or consolidation.

## 2015-01-03 MED ORDER — ONDANSETRON HCL 4 MG PO TABS: 4.0000 mg | ORAL_TABLET | Freq: Four times a day (QID) | ORAL | Status: DC | PRN

## 2015-01-03 MED ORDER — ONDANSETRON HCL 4 MG/2ML IJ SOLN: 4.0000 mg | Freq: Four times a day (QID) | INTRAMUSCULAR | Status: DC | PRN

## 2015-01-03 MED ORDER — ALBUTEROL SULFATE (2.5 MG/3ML) 0.083% IN NEBU: 2.5000 mg | INHALATION_SOLUTION | RESPIRATORY_TRACT | Status: DC | PRN

## 2015-01-03 MED ORDER — INFLUENZA VAC SPLIT QUAD 0.5 ML IM SUSY
0.5000 mL | PREFILLED_SYRINGE | INTRAMUSCULAR | Status: AC
  Administered 2015-01-04: 0.5 mL via INTRAMUSCULAR
  Filled 2015-01-03 (×2): qty 0.5

## 2015-01-03 MED ORDER — ACETAMINOPHEN 325 MG PO TABS: 650.0000 mg | ORAL_TABLET | Freq: Four times a day (QID) | ORAL | Status: DC | PRN

## 2015-01-03 MED ORDER — ACETAMINOPHEN 650 MG RE SUPP: 650.0000 mg | Freq: Four times a day (QID) | RECTAL | Status: DC | PRN

## 2015-01-03 MED ORDER — SODIUM CHLORIDE 0.9 % IJ SOLN: 3.0000 mL | Freq: Two times a day (BID) | INTRAMUSCULAR | Status: DC

## 2015-01-03 MED ORDER — SODIUM CHLORIDE 0.9 % IV SOLN
Freq: Once | INTRAVENOUS | Status: AC
  Administered 2015-01-03: 14:00:00 via INTRAVENOUS

## 2015-01-03 NOTE — ED Provider Notes (Signed)
CSN: [id number]     Arrival date & time 01/03/15  1126 History   First MD Initiated Contact with Patient 01/03/15 1213     Chief Complaint  Patient presents with  . Shortness of Breath  . Chest Pain     (Consider location/radiation/quality/duration/timing/severity/associated sxs/prior Treatment) HPI Angela Castillo is a 39 y.o. female who comes in for evaluation of epigastric discomfort. Patient states since Sunday she started to experience some epigastric discomfort that she compares to a "Volcano that is trying to erupt". She also reports shortness of breath with exertion since Sunday. She reports any light movement or small amount of walking causes her to become "wiped out". She also reports muscle soreness in her bilateral shoulders and hips. She also reports last Tuesday she felt like she had an episode of "dark tarry stool". She denies any chest pain, cough, headache, abdominal pain, nausea or vomiting, diarrhea or constipation. Reports only taking ibuprofen intermittently every 3 or 4 days. Reports last period was at the beginning of February and has been normal for her.  Past Medical History  Diagnosis Date  . Reflux   . Elevated liver enzymes    Past Surgical History  Procedure Laterality Date  . Tubal ligation     No family history on file. History  Substance Use Topics  . Smoking status: Never Smoker   . Smokeless tobacco: Not on file  . Alcohol Use: No   OB History    No data available     Review of Systems A 10 point review of systems was completed and was negative except for pertinent positives and negatives as mentioned in the history of present illness     Allergies  Review of patient's allergies indicates no known allergies.  Home Medications   Prior to Admission medications   Medication Sig Start Date End Date Taking? Authorizing Provider  ibuprofen (ADVIL,MOTRIN) 200 MG tablet Take 400 mg by mouth every 6 (six) hours as needed for headache or  moderate pain.   Yes Historical Provider, MD   BP 136/83 mmHg  Pulse 114  Temp(Src) 98.4 F (36.9 C) (Oral)  Resp 18  SpO2 100%  LMP 12/05/2014 Physical Exam  Constitutional: She is oriented to person, place, and time. She appears well-developed and well-nourished. No distress.  HENT:  Head: Normocephalic and atraumatic.  Mouth/Throat: Oropharynx is clear and moist. No oropharyngeal exudate.  Eyes: Pupils are equal, round, and reactive to light. Right eye exhibits no discharge. Left eye exhibits no discharge. No scleral icterus.  Pale conjunctiva  Neck: Normal range of motion. Neck supple. No JVD present.  Cardiovascular: Normal rate, regular rhythm and normal heart sounds.  Exam reveals no gallop and no friction rub.   No murmur heard. Pulmonary/Chest: Effort normal and breath sounds normal. No respiratory distress. She has no wheezes. She has no rales.  Abdominal: Soft. There is no tenderness.  Genitourinary:  Chaperone present for entirety of rectal exam. No frank blood or hemorrhoids on rectal exam. Soft brown stool on glove and in rectal vault.  Musculoskeletal: Normal range of motion. She exhibits no tenderness.  Neurological: She is alert and oriented to person, place, and time.  Cranial Nerves II-XII grossly intact  Skin: Skin is warm and dry. No rash noted. She is not diaphoretic.  Psychiatric: She has a normal mood and affect.  Nursing note and vitals reviewed.   ED Course  Procedures (including critical care time) Labs Review Labs Reviewed  CBC WITH DIFFERENTIAL/PLATELET -  Abnormal; Notable for the following:    RBC 2.54 (*)    Hemoglobin 6.4 (*)    HCT 21.5 (*)    MCH 25.2 (*)    MCHC 29.8 (*)    RDW 17.0 (*)    Platelets 141 (*)    All other components within normal limits  COMPREHENSIVE METABOLIC PANEL  LIPASE, BLOOD  URINALYSIS, ROUTINE W REFLEX MICROSCOPIC  BRAIN NATRIURETIC PEPTIDE  I-STAT TROPOININ, ED  POC OCCULT BLOOD, ED    Imaging Review No  results found.   EKG Interpretation None     Meds given in ED:  Medications  sodium chloride 0.9 % injection 3 mL (not administered)  acetaminophen (TYLENOL) tablet 650 mg (not administered)    Or  acetaminophen (TYLENOL) suppository 650 mg (not administered)  ondansetron (ZOFRAN) tablet 4 mg (not administered)    Or  ondansetron (ZOFRAN) injection 4 mg (not administered)  albuterol (PROVENTIL) (2.5 MG/3ML) 0.083% nebulizer solution 2.5 mg (not administered)  Influenza vac split quadrivalent PF (FLUARIX) injection 0.5 mL (not administered)  0.9 %  sodium chloride infusion ( Intravenous New Bag/Given 01/03/15 1415)    Current Discharge Medication List     Filed Vitals:   01/03/15 1441 01/03/15 1556 01/03/15 1652 01/03/15 1730  BP: 143/74 141/78 147/77 154/77  Pulse: 104 100 104 109  Temp: 98.5 F (36.9 C) 100 F (37.8 C) 99.6 F (37.6 C) 99.1 F (37.3 C)  TempSrc: Oral Oral Oral Oral  Resp: Height:   (1.6 m)    Weight:  237 lb 10.5 oz (107.8 kg)    SpO2: 100% 99% 99% 99%    MDM  Patient resting comfortably in ED. Vital signs are stable, patient remains slightly tachycardic. Denies shortness of breath at this time. Found to be anemic with a hemoglobin of 6.4. Hemoccult negative. No frank blood on exam. Benign abdominal exam. EKG not concerning. Elevated LFTs appear to be chronic. Chest x-ray shows no edema or consolidation  Discussed patient presentation with attending, Dr. Micheline Maze. Decision made to have patient admitted for blood transfusion. Consult to internal medicine, will see in ED. Patient admitted  Final diagnoses:  Symptomatic anemia        Sharlene Motts, PA-C 01/03/15 1745  Toy Cookey, MD 01/04/15 [phone]

## 2015-01-03 NOTE — ED Notes (Addendum)
Tara on 4th floor called about patient 20 min arrival time@ 14:42

## 2015-01-03 NOTE — ED Notes (Signed)
Pt c/o shob esp with exertional dyspnea and epigastric/mid chest pain that started on Saturday and has been intermittent since.  Pt states that in center of her chest "feels like a volcano that is trying to erupt".

## 2015-01-03 NOTE — H&P (Signed)
[facility] History and Physical  Angela Castillo WPV:[id number] DOB: 01/22/1976 DOA: 01/03/2015   PCP: Denies having a PCP Specialists: None  Chief Complaint: Shortness of breath with exertion for the last few days  HPI: Angela Castillo is a 39 y.o. female with the known significant past medical history. She gets the occasional headache and upper back and neck pain occasionally for which she takes Motrin but not on a daily basis. She was in her usual state of health until a few days ago when she started noticing that she was getting weak and tired. Then she started noticing shortness of breath with exertion. No cough. Had some chest tightness with exertion. Felt dizzy this morning but denies any syncopal episode. She would have muscle aches especially in the thigh area. Denies any falls or injuries. No bruising. Had a temperature of 99.32F a few days ago. Denies any black stools, no blood in the stools. Denies more heavy vaginal bleeding than normal during her menstrual periods. Last period was in the beginning of February. Denies symptoms of acid reflux currently. Never had anemia before. Never been transfused before. Denies any new medications. Denies any illness or sickness in the last couple of weeks.  Home Medications: Prior to Admission medications   Medication Sig Start Date End Date Taking? Authorizing Provider  ibuprofen (ADVIL,MOTRIN) 200 MG tablet Take 400 mg by mouth every 6 (six) hours as needed for headache or moderate pain.   Yes Historical Provider, MD    Allergies: No Known Allergies  Past Medical History: Past Medical History  Diagnosis Date  . Reflux   . Elevated liver enzymes     Past Surgical History  Procedure Laterality Date  . Tubal ligation      Social History: She lives in [place]. She works in [employer]. No smoking. No alcohol in the last 1 year. Denies any illicit drug use. Independent with daily activities.  Family History:  Family  History  Problem Relation Age of Onset  . COPD Mother   . Esophageal cancer Mother      Review of Systems - History obtained from the patient General ROS: positive for  - fatigue Psychological ROS: negative Ophthalmic ROS: negative ENT ROS: negative Allergy and Immunology ROS: negative Hematological and Lymphatic ROS: negative Endocrine ROS: negative Respiratory ROS: as in hpi Cardiovascular ROS: as in hpi Gastrointestinal ROS: no abdominal pain, change in bowel habits, or black or bloody stools Genito-Urinary ROS: no dysuria, trouble voiding, or hematuria Musculoskeletal ROS: negative Neurological ROS: no TIA or stroke symptoms Dermatological ROS: negative  Physical Examination  Filed Vitals:   01/03/15 1130  BP: 136/83  Pulse: 114  Temp: 98.4 F (36.9 C)  TempSrc: Oral  Resp: 18  SpO2: 100%    BP 136/83 mmHg  Pulse 114  Temp(Src) 98.4 F (36.9 C) (Oral)  Resp 18  SpO2 100%  LMP 12/05/2014  General appearance: alert, cooperative, appears stated age and no distress Head: Normocephalic, without obvious abnormality, atraumatic Eyes: conjunctivae/corneas clear. PERRL, EOM's intact.  Throat: lips, mucosa, and tongue normal; teeth and gums normal Neck: no adenopathy, no carotid bruit, no JVD, supple, symmetrical, trachea midline and thyroid not enlarged, symmetric, no tenderness/mass/nodules Back: symmetric, no curvature. ROM normal. No CVA tenderness. Resp: clear to auscultation bilaterally Cardio: regular rate and rhythm, S1, S2 normal, no murmur, click, rub or gallop GI: soft, non-tender; bowel sounds normal; no masses,  no organomegaly Extremities: extremities normal, atraumatic, no cyanosis or edema Pulses: 2+ and symmetric  Skin: Skin color, texture, turgor normal. No rashes or lesions Lymph nodes: Cervical, supraclavicular, and axillary nodes normal. Neurologic: Alert and oriented 3. No focal neurological deficits are noted.  Laboratory Data: Results for  orders placed or performed during the hospital encounter of 01/03/15 (from the past 48 hour(s))  Urinalysis, Routine w reflex microscopic     Status: None   Collection Time: 01/03/15 11:38 AM  Result Value Ref Range   Color, Urine YELLOW YELLOW   APPearance CLEAR CLEAR   Specific Gravity, Urine 1.024 1.005 - 1.030   pH 5.5 5.0 - 8.0   Glucose, UA NEGATIVE NEGATIVE mg/dL   Hgb urine dipstick NEGATIVE NEGATIVE   Bilirubin Urine NEGATIVE NEGATIVE   Ketones, ur NEGATIVE NEGATIVE mg/dL   Protein, ur NEGATIVE NEGATIVE mg/dL   Urobilinogen, UA 1.0 0.0 - 1.0 mg/dL   Nitrite NEGATIVE NEGATIVE   Leukocytes, UA NEGATIVE NEGATIVE    Comment: MICROSCOPIC NOT DONE ON URINES WITH NEGATIVE PROTEIN, BLOOD, LEUKOCYTES, NITRITE, OR GLUCOSE <1000 mg/dL.  Brain natriuretic peptide     Status: None   Collection Time: 01/03/15 12:00 PM  Result Value Ref Range   B Natriuretic Peptide 41.3 0.0 - 100.0 pg/mL  CBC with Differential     Status: Abnormal   Collection Time: 01/03/15 12:01 PM  Result Value Ref Range   WBC 6.0 4.0 - 10.5 K/uL   RBC 2.54 (L) 3.87 - 5.11 MIL/uL   Hemoglobin 6.4 (LL) 12.0 - 15.0 g/dL    Comment: REPEATED TO VERIFY CRITICAL RESULT CALLED TO, READ BACK BY AND VERIFIED WITH: CAMPBELL, K RN AT [phone] [id number] BY KONTOS, G    HCT 21.5 (L) 36.0 - 46.0 %   MCV 84.6 78.0 - 100.0 fL   MCH 25.2 (L) 26.0 - 34.0 pg   MCHC 29.8 (L) 30.0 - 36.0 g/dL   RDW 17.0 (H) 11.5 - 15.5 %   Platelets 141 (L) 150 - 400 K/uL   Neutrophils Relative % 61 43 - 77 %   Neutro Abs 3.6 1.7 - 7.7 K/uL   Lymphocytes Relative 32 12 - 46 %   Lymphs Abs 1.9 0.7 - 4.0 K/uL   Monocytes Relative 6 3 - 12 %   Monocytes Absolute 0.4 0.1 - 1.0 K/uL   Eosinophils Relative 2 0 - 5 %   Eosinophils Absolute 0.1 0.0 - 0.7 K/uL   Basophils Relative 0 0 - 1 %   Basophils Absolute 0.0 0.0 - 0.1 K/uL  Comprehensive metabolic panel     Status: Abnormal   Collection Time: 01/03/15 12:01 PM  Result Value Ref Range   Sodium  143 135 - 145 mmol/L   Potassium 3.6 3.5 - 5.1 mmol/L   Chloride 114 (H) 96 - 112 mmol/L   CO2 23 19 - 32 mmol/L   Glucose, Bld 105 (H) 70 - 99 mg/dL   BUN 15 6 - 23 mg/dL   Creatinine, Ser 0.47 (L) 0.50 - 1.10 mg/dL   Calcium 8.7 8.4 - 10.5 mg/dL   Total Protein 7.8 6.0 - 8.3 g/dL   Albumin 3.7 3.5 - 5.2 g/dL   AST 43 (H) 0 - 37 U/L   ALT 40 (H) 0 - 35 U/L   Alkaline Phosphatase 79 39 - 117 U/L   Total Bilirubin 0.4 0.3 - 1.2 mg/dL   GFR calc non Af Amer >90 >90 mL/min   GFR calc Af Amer >90 >90 mL/min    Comment: (NOTE) The eGFR has been calculated using  the CKD EPI equation. This calculation has not been validated in all clinical situations. eGFR's persistently <90 mL/min signify possible Chronic Kidney Disease.    Anion gap 6 5 - 15  Lipase, blood     Status: None   Collection Time: 01/03/15 12:01 PM  Result Value Ref Range   Lipase 26 11 - 59 U/L  I-stat troponin, ED (only if pt is 39 y.o. or older & pain is above umbilicus) - do not order at [facility]     Status: None   Collection Time: 01/03/15 12:09 PM  Result Value Ref Range   Troponin i, poc 0.00 0.00 - 0.08 ng/mL   Comment 3            Comment: Due to the release kinetics of cTnI, a negative result within the first hours of the onset of symptoms does not rule out myocardial infarction with certainty. If myocardial infarction is still suspected, repeat the test at appropriate intervals.   POC occult blood, ED Provider will collect     Status: None   Collection Time: 01/03/15  1:07 PM  Result Value Ref Range   Fecal Occult Bld NEGATIVE NEGATIVE    Radiology Reports: Dg Chest 2 View  01/03/2015   CLINICAL DATA:  Exertional dyspnea and mid portion chest pain for 4 days  EXAM: CHEST  2 VIEW  COMPARISON:  September 12, 2013  FINDINGS: Lungs are clear. Heart size and pulmonary vascularity are normal. No adenopathy. No pneumothorax. No bone lesions.  IMPRESSION: No edema or consolidation.   Electronically Signed   By: Lowella Grip III M.D.   On: 01/03/2015 13:48    Electrocardiogram: Sinus rhythm at 101 bpm. Normal axis. Intervals are normal. No Q waves. No concerning ST or T-wave changes are noted.  Problem List  Principal Problem:   Symptomatic anemia Active Problems:   Transaminitis   Assessment: This is a 39 year old Caucasian female who presents with shortness of breath with exertion, chest tightness and is found to have severe anemia. Hemoglobin was 11.6 in August 2015. Today at 6.4. Normocytic. Stool for occult blood is negative. No obvious overt bleeding noted otherwise. Bilirubin is normal. Reason for her anemia is not entirely clear.  Plan: #1 Symptomatic anemia: We will initiate anemia workup in the form of anemia panel, LDH, haptoglobin, peripheral smear examination. Since she is symptomatic she will be transfused 2 units of blood. No obvious evidence for bleeding. She will need to be referred to hematology for further management. She will be asked to discontinue NSAIDs.  #2 transaminitis: AST, ALT are minimally elevated. She's had this in the past as well. Hepatitis panel will be ordered. Otherwise, further workup can be pursued as an outpatient. Her abdomen is benign.  #3 Muscle aches: No obvious neurological deficits noted. No obvious deformity or abnormality seen. Will check CK level and ESR. Again, further management to be pursued as an outpatient.  #4 Shortness of breath and chest tightness: This is secondary to severe anemia. EKG is nonischemic. Initial troponin is normal. Chest x-ray is unremarkable. She should feel symptomatically improved after transfusion.   DVT Prophylaxis: SCDs Code Status: Full code Family Communication: Discussed with the patient and her mother  Disposition Plan: Observe. 2. Telemetry   Further management decisions will depend on results of further testing and patient's response to treatment.   [facility]  [facility] Pager [phone]  If  7PM-7AM, please contact night-coverage [email or web address] Password [email or web address]  01/03/2015, 2:15 PM

## 2015-01-04 DIAGNOSIS — D509 Iron deficiency anemia, unspecified: Secondary | ICD-10-CM

## 2015-01-04 DIAGNOSIS — R0789 Other chest pain: Secondary | ICD-10-CM

## 2015-01-04 DIAGNOSIS — D696 Thrombocytopenia, unspecified: Secondary | ICD-10-CM

## 2015-01-04 LAB — HEPATITIS PANEL, ACUTE
HCV Ab: NEGATIVE
Hep A IgM: NONREACTIVE
Hep B C IgM: NONREACTIVE
Hepatitis B Surface Ag: NEGATIVE

## 2015-01-04 LAB — TYPE AND SCREEN
ABO/RH(D): O POS
Antibody Screen: NEGATIVE
Unit division: 0
Unit division: 0

## 2015-01-04 LAB — COMPREHENSIVE METABOLIC PANEL
ALT: 38 U/L — ABNORMAL HIGH (ref 0–35)
AST: 38 U/L — ABNORMAL HIGH (ref 0–37)
Albumin: 3.4 g/dL — ABNORMAL LOW (ref 3.5–5.2)
Alkaline Phosphatase: 70 U/L (ref 39–117)
Anion gap: 5 (ref 5–15)
BUN: 9 mg/dL (ref 6–23)
CO2: 24 mmol/L (ref 19–32)
Calcium: 8.4 mg/dL (ref 8.4–10.5)
Chloride: 109 mmol/L (ref 96–112)
Creatinine, Ser: 0.49 mg/dL — ABNORMAL LOW (ref 0.50–1.10)
GFR calc Af Amer: >90 mL/min (ref >90)
GFR calc non Af Amer: >90 mL/min (ref >90)
Glucose, Bld: 128 mg/dL — ABNORMAL HIGH (ref 70–99)
Potassium: 3.7 mmol/L (ref 3.5–5.1)
Sodium: 138 mmol/L (ref 135–145)
Total Bilirubin: 0.7 mg/dL (ref 0.3–1.2)
Total Protein: 7.1 g/dL (ref 6.0–8.3)

## 2015-01-04 LAB — CBC
HCT: 25.3 % — ABNORMAL LOW (ref 36.0–46.0)
Hemoglobin: 7.8 g/dL — ABNORMAL LOW (ref 12.0–15.0)
MCH: 25.7 pg — ABNORMAL LOW (ref 26.0–34.0)
MCHC: 30.8 g/dL (ref 30.0–36.0)
MCV: 83.5 fL (ref 78.0–100.0)
Platelets: 138 10*3/uL — ABNORMAL LOW (ref 150–400)
RBC: 3.03 MIL/uL — ABNORMAL LOW (ref 3.87–5.11)
RDW: 16 % — ABNORMAL HIGH (ref 11.5–15.5)
WBC: 6.6 10*3/uL (ref 4.0–10.5)

## 2015-01-04 LAB — PROTIME-INR
INR: 1.19 (ref 0.00–1.49)
Prothrombin Time: 15.3 seconds — ABNORMAL HIGH (ref 11.6–15.2)

## 2015-01-04 LAB — TROPONIN I
Troponin I: <0.03 ng/mL (ref <0.031)
Troponin I: <0.03 ng/mL (ref <0.031)

## 2015-01-04 LAB — APTT: aPTT: 31 seconds (ref 24–37)

## 2015-01-04 LAB — PREGNANCY, URINE: Preg Test, Ur: NEGATIVE

## 2015-01-04 MED ORDER — SODIUM CHLORIDE 0.9 % IV SOLN
125.0000 mg | Freq: Once | INTRAVENOUS | Status: AC
  Administered 2015-01-04: 125 mg via INTRAVENOUS
  Filled 2015-01-04: qty 10

## 2015-01-04 MED ORDER — FERROUS SULFATE 325 (65 FE) MG PO TABS
325.0000 mg | ORAL_TABLET | Freq: Two times a day (BID) | ORAL | Status: DC
  Administered 2015-01-04: 325 mg via ORAL
  Filled 2015-01-04: qty 1

## 2015-01-04 NOTE — Progress Notes (Addendum)
Called PCP to make pt appointment; answering machine said that scheduler was not available.  Educated pt on importance of making appointment within one week.   Went over all discharge with pt, all questions answered.  VSS.  Pt refused wheelchair, walked out with family members.

## 2015-01-04 NOTE — Progress Notes (Signed)
UR completed 

## 2015-01-04 NOTE — Discharge Summary (Signed)
Physician Discharge Summary  Angela Castillo CZY:[id number] DOB: Oct 05, 1976 DOA: 01/03/2015  PCP: No PCP Per Patient  Admit date: 01/03/2015 Discharge date: 01/04/2015  Recommendations for Outpatient Follow-up:  1. Pt will need to follow up with PCP in 2 weeks post discharge 2. Please obtain CBC in one week  Discharge Diagnoses:  Symptomatic anemia -Hemoccult negative -Likely due to iron deficiency -Iron saturation 2%, ferritin 12 -Received 2 units PRBCs during the admission -Given intravenous nulecit -Instructed to take ferrous sulfate 325 mg twice a day after discharge -LDH normal--with normal bilirubin, for a low suspicion for hemolysis -Haptoglobin pending at the time of discharge -counseled to d/c chronic NSAID use Transaminasemia -likely due to fatty liver -07/17/2009 abdominal ultrasound suggested fatty liver Thrombocytopenia -This has been present dating back to 09/12/2013 -Check HIV--results pending at time of d/c -Will need outpatient follow-up -No active bleeding at this time -PTT and INR are within normal limits Atypical chest pain/SOB -Likely due to asymptomatic anemia -Resolved -Troponins negative -EKG without any concerning changes Myalgias -May be due to anemia -CK 87 -ESR 48 Hyperglycemia -HbA1c Discharge Condition: stable  Disposition:  Follow-up Information    Follow up with [facility]     In 1 week.   Contact information:   [address] [place] [address] [phone]    home  Diet:regular Wt Readings from Last 3 Encounters:  01/03/15 107.8 kg (237 lb 10.5 oz)  09/12/13 107.865 kg (237 lb 12.8 oz)  09/28/12 106.595 kg (235 lb)    History of present illness:  39 year old female presented with a few day history of chest discomfort and shortness of breath with associated dizziness. The patient states that she takes ibuprofen daily for the past several months for neck pain and  headache. She denied any hemoptysis, hematemesis, hematochezia, melena. She states that her menses are fairly normal but quite heavy in the beginning of each month. The patient was admitted and given 2 units PRBCs. Workup revealed iron deficiency anemia. Hemoccult was negative. Workup was not consistent with hemolysis. The patient was transfused with intravenous iron and instructed to start oral iron. The patient improved clinically after transfusion of PRBC. She will need follow-up on her thrombocytopenia as discussed above.    Discharge Exam: Filed Vitals:   01/04/15 1402  BP: 144/84  Pulse: 93  Temp: 99.2 F (37.3 C)  Resp: 20   Filed Vitals:   01/03/15 2035 01/03/15 2230 01/04/15 0556 01/04/15 1402  BP: 152/79 140/85 129/71 144/84  Pulse: 106 101 82 93  Temp: 99.2 F (37.3 C) 99.3 F (37.4 C) 98.6 F (37 C) 99.2 F (37.3 C)  TempSrc: Oral Oral Oral Oral  Resp: 20 19 18 20   Height:      Weight:      SpO2: 99% 99% 100% 99%   General: A&O x 3, NAD, pleasant, cooperative Cardiovascular: RRR, no rub, no gallop, no S3 Respiratory: CTAB, no wheeze, no rhonchi Abdomen:soft, nontender, nondistended, positive bowel sounds Extremities: No edema, No lymphangitis, no petechiae  Discharge Instructions      Discharge Instructions    Diet - low sodium heart healthy    Complete by:  As directed      Increase activity slowly    Complete by:  As directed             Medication List    STOP taking these medications        ibuprofen 200 MG tablet  Commonly  known as:  ADVIL,MOTRIN         The results of significant diagnostics from this hospitalization (including imaging, microbiology, ancillary and laboratory) are listed below for reference.    Significant Diagnostic Studies: Dg Chest 2 View  01/03/2015   CLINICAL DATA:  Exertional dyspnea and mid portion chest pain for 4 days  EXAM: CHEST  2 VIEW  COMPARISON:  September 12, 2013  FINDINGS: Lungs are clear. Heart size and  pulmonary vascularity are normal. No adenopathy. No pneumothorax. No bone lesions.  IMPRESSION: No edema or consolidation.   Electronically Signed   By: Lowella Grip III M.D.   On: 01/03/2015 13:48     Microbiology: No results found for this or any previous visit (from the past 240 hour(s)).   Labs: Basic Metabolic Panel:  Recent Labs Lab 01/03/15 1201 01/04/15 0255  NA 143 138  K 3.6 3.7  CL 114* 109  CO2 23 24  GLUCOSE 105* 128*  BUN 15 9  CREATININE 0.47* 0.49*  CALCIUM 8.7 8.4   Liver Function Tests:  Recent Labs Lab 01/03/15 1201 01/04/15 0255  AST 43* 38*  ALT 40* 38*  ALKPHOS 79 70  BILITOT 0.4 0.7  PROT 7.8 7.1  ALBUMIN 3.7 3.4*    Recent Labs Lab 01/03/15 1201  LIPASE 26   No results for input(s): AMMONIA in the last 168 hours. CBC:  Recent Labs Lab 01/03/15 1201 01/04/15 0255  WBC 6.0 6.6  NEUTROABS 3.6  --   HGB 6.4* 7.8*  HCT 21.5* 25.3*  MCV 84.6 83.5  PLT 141* 138*   Cardiac Enzymes:  Recent Labs Lab 01/03/15 1511 01/04/15 0200 01/04/15 0954  CKTOTAL 87  --   --   TROPONINI  --  <0.03 <0.03   BNP: Invalid input(s): POCBNP CBG: No results for input(s): GLUCAP in the last 168 hours.  Time coordinating discharge:  Greater than 30 minutes  Signed:  Carlen Rebuck, DO [facility] Pager: [phone] 01/04/2015, 3:39 PM

## 2015-01-05 LAB — HAPTOGLOBIN: Haptoglobin: 150 mg/dL (ref 34–200)

## 2015-01-05 LAB — HIV ANTIBODY (ROUTINE TESTING W REFLEX): HIV Screen 4th Generation wRfx: NONREACTIVE

## 2015-01-11 ENCOUNTER — Ambulatory Visit: Payer: PRIVATE HEALTH INSURANCE | Attending: Family Medicine | Admitting: Family Medicine

## 2015-01-11 VITALS — BP 147/89 | HR 87 | Temp 99.0°F | Resp 16 | Ht 63.0 in | Wt 238.0 lb

## 2015-01-11 DIAGNOSIS — D509 Iron deficiency anemia, unspecified: Secondary | ICD-10-CM

## 2015-01-11 DIAGNOSIS — D649 Anemia, unspecified: Secondary | ICD-10-CM

## 2015-01-11 NOTE — Progress Notes (Signed)
Patient here to f/u after hospitilization due to anemia.  Patient reports feeling a lot better and has been taking her iron as instructed.

## 2015-01-11 NOTE — Assessment & Plan Note (Signed)
She is alert, oriented, and in no distress. Her skin is warm and dry. Her lungs are clear to auscultation heart sounds are regular without murmur gallop or  Plan we are doing a CBC today and will have her follow-up with her assigned primary physician in 7-10 days

## 2015-01-11 NOTE — Patient Instructions (Signed)
Iron Deficiency Anemia Anemia is a condition in which there are less red blood cells or hemoglobin in the blood than normal. Hemoglobin is the part of red blood cells that carries oxygen. Iron deficiency anemia is anemia caused by too little iron. It is the most common type of anemia. It may leave you tired and short of breath. CAUSES   Lack of iron in the diet.  Poor absorption of iron, as seen with intestinal disorders.  Intestinal bleeding.  Heavy periods. SIGNS AND SYMPTOMS  Mild anemia may not be noticeable. Symptoms may include:  Fatigue.  Headache.  Pale skin.  Weakness.  Tiredness.  Shortness of breath.  Dizziness.  Cold hands and feet.  Fast or irregular heartbeat. DIAGNOSIS  Diagnosis requires a thorough evaluation and physical exam by your health care provider. Blood tests are generally used to confirm iron deficiency anemia. Additional tests may be done to find the underlying cause of your anemia. These may include:  Testing for blood in the stool (fecal occult blood test).  A procedure to see inside the colon and rectum (colonoscopy).  A procedure to see inside the esophagus and stomach (endoscopy). TREATMENT  Iron deficiency anemia is treated by correcting the cause of the deficiency. Treatment may involve:  Adding iron-rich foods to your diet.  Taking iron supplements. Pregnant or breastfeeding women need to take extra iron because their normal diet usually does not provide the required amount.  Taking vitamins. Vitamin C improves the absorption of iron. Your health care provider may recommend that you take your iron tablets with a glass of orange juice or vitamin C supplement.  Medicines to make heavy menstrual flow lighter.  Surgery. HOME CARE INSTRUCTIONS   Take iron as directed by your health care provider.  If you cannot tolerate taking iron supplements by mouth, talk to your health care provider about taking them through a vein  (intravenously) or an injection into a muscle.  For the best iron absorption, iron supplements should be taken on an empty stomach. If you cannot tolerate them on an empty stomach, you may need to take them with food.  Do not drink milk or take antacids at the same time as your iron supplements. Milk and antacids may interfere with the absorption of iron.  Iron supplements can cause constipation. Make sure to include fiber in your diet to prevent constipation. A stool softener may also be recommended.  Take vitamins as directed by your health care provider.  Eat a diet rich in iron. Foods high in iron include liver, lean beef, whole-grain bread, eggs, dried fruit, and dark green leafy vegetables. SEEK IMMEDIATE MEDICAL CARE IF:   You faint. If this happens, do not drive. Call your local emergency services (911 in [place] if no other help is available.  You have chest pain.  You feel nauseous or vomit.  You have severe or increased shortness of breath with activity.  You feel weak.  You have a rapid heartbeat.  You have unexplained sweating.  You become light-headed when getting up from a chair or bed. MAKE SURE YOU:   Understand these instructions.  Will watch your condition.  Will get help right away if you are not doing well or get worse. Document Released: 10/18/2000 Document Revised: 10/26/2013 Document Reviewed: 06/28/2013 [facility] Patient Information 2015 [place], [place]. This information is not intended to replace advice given to you by your health care provider. Make sure you discuss any questions you have with your health care provider.  Continue iron supplement, meet with financial consultant Make appointment in 7-10 days with assigned provider

## 2015-01-12 LAB — CBC WITH DIFFERENTIAL/PLATELET
Basophils Absolute: 0 10*3/uL (ref 0.0–0.1)
Basophils Relative: 0 % (ref 0–1)
Eosinophils Absolute: 0.1 10*3/uL (ref 0.0–0.7)
Eosinophils Relative: 2 % (ref 0–5)
HCT: 30.8 % — ABNORMAL LOW (ref 36.0–46.0)
Hemoglobin: 9.3 g/dL — ABNORMAL LOW (ref 12.0–15.0)
Lymphocytes Relative: 31 % (ref 12–46)
Lymphs Abs: 1.6 10*3/uL (ref 0.7–4.0)
MCH: 24.8 pg — ABNORMAL LOW (ref 26.0–34.0)
MCHC: 30.2 g/dL (ref 30.0–36.0)
MCV: 82.1 fL (ref 78.0–100.0)
MPV: 10.7 fL (ref 8.6–12.4)
Monocytes Absolute: 0.2 10*3/uL (ref 0.1–1.0)
Monocytes Relative: 4 % (ref 3–12)
Neutro Abs: 3.3 10*3/uL (ref 1.7–7.7)
Neutrophils Relative %: 63 % (ref 43–77)
RBC: 3.75 MIL/uL — ABNORMAL LOW (ref 3.87–5.11)
RDW: 17.6 % — ABNORMAL HIGH (ref 11.5–15.5)
WBC: 5.3 10*3/uL (ref 4.0–10.5)

## 2015-01-18 ENCOUNTER — Encounter: Payer: Self-pay | Admitting: Family Medicine

## 2015-01-18 ENCOUNTER — Ambulatory Visit: Payer: PRIVATE HEALTH INSURANCE | Attending: Family Medicine | Admitting: Family Medicine

## 2015-01-18 VITALS — BP 148/81 | HR 94 | Temp 99.5°F | Resp 18 | Ht 63.0 in | Wt 236.0 lb

## 2015-01-18 DIAGNOSIS — J00 Acute nasopharyngitis [common cold]: Secondary | ICD-10-CM | POA: Diagnosis not present

## 2015-01-18 DIAGNOSIS — D649 Anemia, unspecified: Secondary | ICD-10-CM | POA: Insufficient documentation

## 2015-01-18 DIAGNOSIS — Z23 Encounter for immunization: Secondary | ICD-10-CM

## 2015-01-18 DIAGNOSIS — D509 Iron deficiency anemia, unspecified: Secondary | ICD-10-CM

## 2015-01-18 DIAGNOSIS — R05 Cough: Secondary | ICD-10-CM | POA: Insufficient documentation

## 2015-01-18 DIAGNOSIS — IMO0001 Reserved for inherently not codable concepts without codable children: Secondary | ICD-10-CM | POA: Insufficient documentation

## 2015-01-18 DIAGNOSIS — I10 Essential (primary) hypertension: Secondary | ICD-10-CM | POA: Insufficient documentation

## 2015-01-18 NOTE — Patient Instructions (Addendum)
[name],  Thank you for coming in today. It was a pleasure meeting you. I look forward to being your primary doctor.   1. Iron deficiency anemia: Checking CBC today. Lab Results  Component Value Date   HGB 9.3* 01/11/2015   Continue iron therapy with orange juice or vit C tablet to help absorption  2. Cold symptoms: continue self care if you develop fever, chills, SOB, chest pains please return for follow up.   3. HTN: looking  Back you have had elevated BP for at least the last month Hypertension is SBP (systolic blood pressure)  > 140 or DBP (diastolic blood pressure) > 90 BP Readings from Last 3 Encounters:  01/18/15 148/81  01/11/15 147/89  01/04/15 144/84  please start DASH diet If BP elevated at f/u we should start medication to help get it to goal.  F/u in 4-6 weeks for BP check and pap smear  Dr. Armen PickupFunches   Hypertension Hypertension is another name for high blood pressure. High blood pressure forces your heart to work harder to pump blood. A blood pressure reading has two numbers, which includes a higher number over a lower number (example: 110/72). HOME CARE   Have your blood pressure rechecked by your doctor.  Only take medicine as told by your doctor. Follow the directions carefully. The medicine does not work as well if you skip doses. Skipping doses also puts you at risk for problems.  Do not smoke.  Monitor your blood pressure at home as told by your doctor. GET HELP IF:  You think you are having a reaction to the medicine you are taking.  You have repeat headaches or feel dizzy.  You have puffiness (swelling) in your ankles.  You have trouble with your vision. GET HELP RIGHT AWAY IF:   You get a very bad headache and are confused.  You feel weak, numb, or faint.  You get chest or belly (abdominal) pain.  You throw up (vomit).  You cannot breathe very well. MAKE SURE YOU:   Understand these instructions.  Will watch your  condition.  Will get help right away if you are not doing well or get worse. Document Released: 04/08/2008 Document Revised: 10/26/2013 Document Reviewed: 08/13/2013 [facility] Patient Information 2015 [place]. This information is not intended to replace advice given to you by your health care provider. Make sure you discuss any questions you have with your health care provider.  DASH Eating Plan DASH stands for "Dietary Approaches to Stop Hypertension." The DASH eating plan is a healthy eating plan that has been shown to reduce high blood pressure (hypertension). Additional health benefits may include reducing the risk of type 2 diabetes mellitus, heart disease, and stroke. The DASH eating plan may also help with weight loss. WHAT DO I NEED TO KNOW ABOUT THE DASH EATING PLAN? For the DASH eating plan, you will follow these general guidelines:  Choose foods with a percent daily value for sodium of less than 5% (as listed on the food label).  Use salt-free seasonings or herbs instead of table salt or sea salt.  Check with your health care provider or pharmacist before using salt substitutes.  Eat lower-sodium products, often labeled as "lower sodium" or "no salt added."  Eat fresh foods.  Eat more vegetables, fruits, and low-fat dairy products.  Choose whole grains. Look for the word "whole" as the first word in the ingredient list.  Choose fish and skinless chicken or [place] more often than red meat.  Limit fish, poultry, and meat to 6 oz (170 g) each day.  Limit sweets, desserts, sugars, and sugary drinks.  Choose heart-healthy fats.  Limit cheese to 1 oz (28 g) per day.  Eat more home-cooked food and less restaurant, buffet, and fast food.  Limit fried foods.  Cook foods using methods other than frying.  Limit canned vegetables. If you do use them, rinse them well to decrease the sodium.  When eating at a restaurant, ask that your food be prepared with less salt, or no  salt if possible. WHAT FOODS CAN I EAT? Seek help from a dietitian for individual calorie needs. Grains Whole grain or whole wheat bread. Brown rice. Whole grain or whole wheat pasta. Quinoa, bulgur, and whole grain cereals. Low-sodium cereals. Corn or whole wheat flour tortillas. Whole grain cornbread. Whole grain crackers. Low-sodium crackers. Vegetables Fresh or frozen vegetables (raw, steamed, roasted, or grilled). Low-sodium or reduced-sodium tomato and vegetable juices. Low-sodium or reduced-sodium tomato sauce and paste. Low-sodium or reduced-sodium canned vegetables.  Fruits All fresh, canned (in natural juice), or frozen fruits. Meat and Other Protein Products Ground beef (85% or leaner), grass-fed beef, or beef trimmed of fat. Skinless chicken or [place]. Ground chicken or [place]. Pork trimmed of fat. All fish and seafood. Eggs. Dried beans, peas, or lentils. Unsalted nuts and seeds. Unsalted canned beans. Dairy Low-fat dairy products, such as skim or 1% milk, 2% or reduced-fat cheeses, low-fat ricotta or cottage cheese, or plain low-fat yogurt. Low-sodium or reduced-sodium cheeses. Fats and Oils Tub margarines without trans fats. Light or reduced-fat mayonnaise and salad dressings (reduced sodium). Avocado. Safflower, olive, or canola oils. Natural peanut or almond butter. Other Unsalted popcorn and pretzels. The items listed above may not be a complete list of recommended foods or beverages. Contact your dietitian for more options. WHAT FOODS ARE NOT RECOMMENDED? Grains White bread. White pasta. White rice. Refined cornbread. Bagels and croissants. Crackers that contain trans fat. Vegetables Creamed or fried vegetables. Vegetables in a cheese sauce. Regular canned vegetables. Regular canned tomato sauce and paste. Regular tomato and vegetable juices. Fruits Dried fruits. Canned fruit in light or heavy syrup. Fruit juice. Meat and Other Protein Products Fatty cuts of meat. Ribs,  chicken wings, bacon, sausage, bologna, salami, chitterlings, fatback, hot dogs, bratwurst, and packaged luncheon meats. Salted nuts and seeds. Canned beans with salt. Dairy Whole or 2% milk, cream, half-and-half, and cream cheese. Whole-fat or sweetened yogurt. Full-fat cheeses or blue cheese. Nondairy creamers and whipped toppings. Processed cheese, cheese spreads, or cheese curds. Condiments Onion and garlic salt, seasoned salt, table salt, and sea salt. Canned and packaged gravies. Worcestershire sauce. Tartar sauce. Barbecue sauce. Teriyaki sauce. Soy sauce, including reduced sodium. Steak sauce. Fish sauce. Oyster sauce. Cocktail sauce. Horseradish. Ketchup and mustard. Meat flavorings and tenderizers. Bouillon cubes. Hot sauce. Tabasco sauce. Marinades. Taco seasonings. Relishes. Fats and Oils Butter, stick margarine, lard, shortening, ghee, and bacon fat. Coconut, palm kernel, or palm oils. Regular salad dressings. Other Pickles and olives. Salted popcorn and pretzels. The items listed above may not be a complete list of foods and beverages to avoid. Contact your dietitian for more information. WHERE CAN I FIND MORE INFORMATION? National Heart, Lung, and Blood Institute: [employer] Document Released: 10/10/2011 Document Revised: 03/07/2014 Document Reviewed: 08/25/2013 [facility] Patient Information 2015 [place], [place]. This information is not intended to replace advice given to you by your health care provider. Make sure you discuss any questions you have with your health care provider.

## 2015-01-18 NOTE — Assessment & Plan Note (Signed)
Iron deficiency anemia: Checking CBC today. Lab Results  Component Value Date   HGB 9.3* 01/11/2015   Continue iron therapy with orange juice or vit C tablet to help absorption

## 2015-01-18 NOTE — Progress Notes (Signed)
Establish Care F/U low hemoglobin

## 2015-01-18 NOTE — Assessment & Plan Note (Signed)
HTN: looking  Back you have had elevated BP for at least the last month Hypertension is SBP (systolic blood pressure)  > 140 or DBP (diastolic blood pressure) > 90 BP Readings from Last 3 Encounters:  01/18/15 148/81  01/11/15 147/89  01/04/15 144/84  please start DASH diet If BP elevated at f/u we should start medication to help get it to goal.

## 2015-01-18 NOTE — Addendum Note (Signed)
Addended by: Allayne StackWINFREE, Karrington Mccravy R on: 01/18/2015 03:44 PM   Modules accepted: Orders

## 2015-01-18 NOTE — Assessment & Plan Note (Signed)
Cold symptoms: continue self care if you develop fever, chills, SOB, chest pains please return for follow up.

## 2015-01-18 NOTE — Progress Notes (Signed)
   Subjective:    Patient ID: Angela FothergillMichelle D Gow, female    DOB: 09-Jul-1976, 39 y.o.   MRN: [id number] CC: f/u anemia, cold symptoms  HPI  1. Anemia: taking iron BID. Dyspnea has improved. No bleeding. No hx of heavy or prolonged menses.  2. Cold symptoms: x 3 weeks. No fever. Mild substernal chest pressure. Dry cough. No ear ache, head ache, sore throat, congestion. Feeling better.  3. HTN: elevated BP in past month and some before. No HA or edema. Mom with HTN and prediabetes. Dad with DM2.   Soc Hx: non smoker  Review of Systems As per HPI     Objective:   Physical Exam BP Readings from Last 3 Encounters:  01/18/15 148/81  01/11/15 147/89  01/04/15 144/84   BP 148/81 mmHg  Pulse 94  Temp(Src) 99.5 F (37.5 C) (Oral)  Resp 18  Ht 5\' 3"  (1.6 m)  Wt 236 lb (107.049 kg)  BMI 41.82 kg/m2  SpO2 96%  LMP 01/03/2015 General appearance: alert, cooperative and no distress  Eyes: conjunctivae/corneas clear. PERRL, EOM's intact.  Ears: normal TM's and external ear canals both ears Nose: no discharge, turbinates pink, swollen Throat: lips, mucosa, and tongue normal; teeth and gums normal Lungs: clear to auscultation bilaterally Heart: regular rate and rhythm, S1, S2 normal, no murmur, click, rub or gallop Extremities: extremities normal, atraumatic, no cyanosis or edema     Assessment & Plan:

## 2015-01-19 ENCOUNTER — Telehealth: Payer: Self-pay | Admitting: *Deleted

## 2015-01-19 LAB — CBC
HCT: 34.7 % — ABNORMAL LOW (ref 36.0–46.0)
Hemoglobin: 10.7 g/dL — ABNORMAL LOW (ref 12.0–15.0)
MCH: 25.2 pg — ABNORMAL LOW (ref 26.0–34.0)
MCHC: 30.8 g/dL (ref 30.0–36.0)
MCV: 81.8 fL (ref 78.0–100.0)
Platelets: 140 10*3/uL — ABNORMAL LOW (ref 150–400)
RBC: 4.24 MIL/uL (ref 3.87–5.11)
RDW: 17.8 % — ABNORMAL HIGH (ref 11.5–15.5)
WBC: 7 10*3/uL (ref 4.0–10.5)

## 2015-01-19 NOTE — Telephone Encounter (Signed)
-----   Message from Dessa PhiJosalyn Funches, MD sent at 01/19/2015  9:21 AM EDT ----- Hgb trending up now 10.7

## 2015-01-19 NOTE — Telephone Encounter (Signed)
Pt aware of lab work.

## 2015-01-24 ENCOUNTER — Telehealth: Payer: Self-pay | Admitting: *Deleted

## 2015-01-24 NOTE — Telephone Encounter (Signed)
-----   Message from Henrietta HooverLinda C Bernhardt, NP sent at 01/20/2015  9:15 AM EDT ----- Still anemic of course but better.  Continue iron supplement and follow-up for a recheck of anemia in one month

## 2015-02-01 ENCOUNTER — Ambulatory Visit: Payer: PRIVATE HEALTH INSURANCE | Attending: Family Medicine

## 2015-02-15 ENCOUNTER — Telehealth: Payer: Self-pay | Admitting: *Deleted

## 2015-02-15 NOTE — Telephone Encounter (Signed)
Pt left message stated cold Sx, body ache elevated temperature (101.9) Taking Ibuprofen Q 6 hr  I called Pt, unable to contact left message with female to return call

## 2015-02-16 ENCOUNTER — Ambulatory Visit: Payer: No Typology Code available for payment source | Attending: Family Medicine | Admitting: *Deleted

## 2015-02-16 VITALS — BP 150/76 | HR 86 | Temp 98.3°F | Resp 14 | Wt 237.0 lb

## 2015-02-16 DIAGNOSIS — I10 Essential (primary) hypertension: Secondary | ICD-10-CM | POA: Insufficient documentation

## 2015-02-16 DIAGNOSIS — Z0130 Encounter for examination of blood pressure without abnormal findings: Secondary | ICD-10-CM

## 2015-02-16 DIAGNOSIS — Z136 Encounter for screening for cardiovascular disorders: Secondary | ICD-10-CM | POA: Insufficient documentation

## 2015-02-16 MED ORDER — HYDROCHLOROTHIAZIDE 12.5 MG PO TABS: 12.5000 mg | ORAL_TABLET | Freq: Every day | ORAL | Status: DC

## 2015-02-16 NOTE — Progress Notes (Signed)
Patient presents for BP check Med list reviewed; states taking iron as directed Discussed need for low sodium diet and using Mrs. Dash as alternative to salt Encouraged to choose foods with 5% or less of daily value for sodium. Discussed walking 30 minutes per day for exercise as tolerated Denies blurred vision, chest pain or pressure Patient c/o stress-related headaches States SHOB has lessened LMP 02/01/15 states it lasted 6 days and was very heavy On 02/12/15 had fever 101, chills, body aches, and dizziness that lasted 1 day  BP 150/76  left arm manually with large cuff P 86 R 14  T  98.3 oral SPO2 99 %  Wt 237 lb  Per covering provider: HCTZ 12.5 mg daily  Patient to return in 2 weeks for f/u with PCP for BP and pap  Patient given literature on DASH Eating Plan

## 2015-02-16 NOTE — Patient Instructions (Signed)
DASH Eating Plan °DASH stands for "Dietary Approaches to Stop Hypertension." The DASH eating plan is a healthy eating plan that has been shown to reduce high blood pressure (hypertension). Additional health benefits may include reducing the risk of type 2 diabetes mellitus, heart disease, and stroke. The DASH eating plan may also help with weight loss. °WHAT DO I NEED TO KNOW ABOUT THE DASH EATING PLAN? °For the DASH eating plan, you will follow these general guidelines: °· Choose foods with a percent daily value for sodium of less than 5% (as listed on the food label). °· Use salt-free seasonings or herbs instead of table salt or sea salt. °· Check with your health care provider or pharmacist before using salt substitutes. °· Eat lower-sodium products, often labeled as "lower sodium" or "no salt added." °· Eat fresh foods. °· Eat more vegetables, fruits, and low-fat dairy products. °· Choose whole grains. Look for the word "whole" as the first word in the ingredient list. °· Choose fish and skinless chicken or turkey more often than red meat. Limit fish, poultry, and meat to 6 oz (170 g) each day. °· Limit sweets, desserts, sugars, and sugary drinks. °· Choose heart-healthy fats. °· Limit cheese to 1 oz (28 g) per day. °· Eat more home-cooked food and less restaurant, buffet, and fast food. °· Limit fried foods. °· Cook foods using methods other than frying. °· Limit canned vegetables. If you do use them, rinse them well to decrease the sodium. °· When eating at a restaurant, ask that your food be prepared with less salt, or no salt if possible. °WHAT FOODS CAN I EAT? °Seek help from a dietitian for individual calorie needs. °Grains °Whole grain or whole wheat bread. Brown rice. Whole grain or whole wheat pasta. Quinoa, bulgur, and whole grain cereals. Low-sodium cereals. Corn or whole wheat flour tortillas. Whole grain cornbread. Whole grain crackers. Low-sodium crackers. °Vegetables °Fresh or frozen vegetables  (raw, steamed, roasted, or grilled). Low-sodium or reduced-sodium tomato and vegetable juices. Low-sodium or reduced-sodium tomato sauce and paste. Low-sodium or reduced-sodium canned vegetables.  °Fruits °All fresh, canned (in natural juice), or frozen fruits. °Meat and Other Protein Products °Ground beef (85% or leaner), grass-fed beef, or beef trimmed of fat. Skinless chicken or turkey. Ground chicken or turkey. Pork trimmed of fat. All fish and seafood. Eggs. Dried beans, peas, or lentils. Unsalted nuts and seeds. Unsalted canned beans. °Dairy °Low-fat dairy products, such as skim or 1% milk, 2% or reduced-fat cheeses, low-fat ricotta or cottage cheese, or plain low-fat yogurt. Low-sodium or reduced-sodium cheeses. °Fats and Oils °Tub margarines without trans fats. Light or reduced-fat mayonnaise and salad dressings (reduced sodium). Avocado. Safflower, olive, or canola oils. Natural peanut or almond butter. °Other °Unsalted popcorn and pretzels. °The items listed above may not be a complete list of recommended foods or beverages. Contact your dietitian for more options. °WHAT FOODS ARE NOT RECOMMENDED? °Grains °White bread. White pasta. White rice. Refined cornbread. Bagels and croissants. Crackers that contain trans fat. °Vegetables °Creamed or fried vegetables. Vegetables in a cheese sauce. Regular canned vegetables. Regular canned tomato sauce and paste. Regular tomato and vegetable juices. °Fruits °Dried fruits. Canned fruit in light or heavy syrup. Fruit juice. °Meat and Other Protein Products °Fatty cuts of meat. Ribs, chicken wings, bacon, sausage, bologna, salami, chitterlings, fatback, hot dogs, bratwurst, and packaged luncheon meats. Salted nuts and seeds. Canned beans with salt. °Dairy °Whole or 2% milk, cream, half-and-half, and cream cheese. Whole-fat or sweetened yogurt. Full-fat   cheeses or blue cheese. Nondairy creamers and whipped toppings. Processed cheese, cheese spreads, or cheese  curds. °Condiments °Onion and garlic salt, seasoned salt, table salt, and sea salt. Canned and packaged gravies. Worcestershire sauce. Tartar sauce. Barbecue sauce. Teriyaki sauce. Soy sauce, including reduced sodium. Steak sauce. Fish sauce. Oyster sauce. Cocktail sauce. Horseradish. Ketchup and mustard. Meat flavorings and tenderizers. Bouillon cubes. Hot sauce. Tabasco sauce. Marinades. Taco seasonings. Relishes. °Fats and Oils °Butter, stick margarine, lard, shortening, ghee, and bacon fat. Coconut, palm kernel, or palm oils. Regular salad dressings. °Other °Pickles and olives. Salted popcorn and pretzels. °The items listed above may not be a complete list of foods and beverages to avoid. Contact your dietitian for more information. °WHERE CAN I FIND MORE INFORMATION? °National Heart, Lung, and [employer] Institute: www.nhlbi.nih.gov/health/health-topics/topics/dash/ °Document Released: 10/10/2011 Document Revised: 03/07/2014 Document Reviewed: 08/25/2013 °ExitCare® Patient Information ©2015 [employer]. This information is not intended to replace advice given to you by your health care provider. Make sure you discuss any questions you have with your health care provider. ° °

## 2015-02-19 ENCOUNTER — Emergency Department (HOSPITAL_COMMUNITY)
Admission: EM | Admit: 2015-02-19 | Discharge: 2015-02-19 | Disposition: A | Discharge status: Home / Self Care | Payer: PRIVATE HEALTH INSURANCE | Attending: Emergency Medicine | Admitting: Emergency Medicine

## 2015-02-19 ENCOUNTER — Encounter (HOSPITAL_COMMUNITY): Payer: Self-pay | Admitting: *Deleted

## 2015-02-19 ENCOUNTER — Emergency Department (HOSPITAL_COMMUNITY): Payer: PRIVATE HEALTH INSURANCE

## 2015-02-19 DIAGNOSIS — Z79899 Other long term (current) drug therapy: Secondary | ICD-10-CM | POA: Diagnosis not present

## 2015-02-19 DIAGNOSIS — D649 Anemia, unspecified: Secondary | ICD-10-CM | POA: Insufficient documentation

## 2015-02-19 DIAGNOSIS — W1840XA Slipping, tripping and stumbling without falling, unspecified, initial encounter: Secondary | ICD-10-CM | POA: Insufficient documentation

## 2015-02-19 DIAGNOSIS — Y999 Unspecified external cause status: Secondary | ICD-10-CM | POA: Diagnosis not present

## 2015-02-19 DIAGNOSIS — W231XXA Caught, crushed, jammed, or pinched between stationary objects, initial encounter: Secondary | ICD-10-CM | POA: Insufficient documentation

## 2015-02-19 DIAGNOSIS — S99922A Unspecified injury of left foot, initial encounter: Secondary | ICD-10-CM | POA: Diagnosis present

## 2015-02-19 DIAGNOSIS — M79675 Pain in left toe(s): Secondary | ICD-10-CM

## 2015-02-19 DIAGNOSIS — Y929 Unspecified place or not applicable: Secondary | ICD-10-CM | POA: Insufficient documentation

## 2015-02-19 DIAGNOSIS — Z8719 Personal history of other diseases of the digestive system: Secondary | ICD-10-CM | POA: Insufficient documentation

## 2015-02-19 DIAGNOSIS — R52 Pain, unspecified: Secondary | ICD-10-CM

## 2015-02-19 DIAGNOSIS — Y939 Activity, unspecified: Secondary | ICD-10-CM | POA: Insufficient documentation

## 2015-02-19 IMAGING — CR DG FOOT COMPLETE 3+V*L*
3 series · 3 of 3 positions shown · non-contrast
Comparison: None.

CLINICAL DATA: Right great toe injury today. Pain about the dorsal
aspect of the great toe. Initial encounter.

EXAM:
LEFT FOOT - COMPLETE 3+ VIEW

[foot ap]
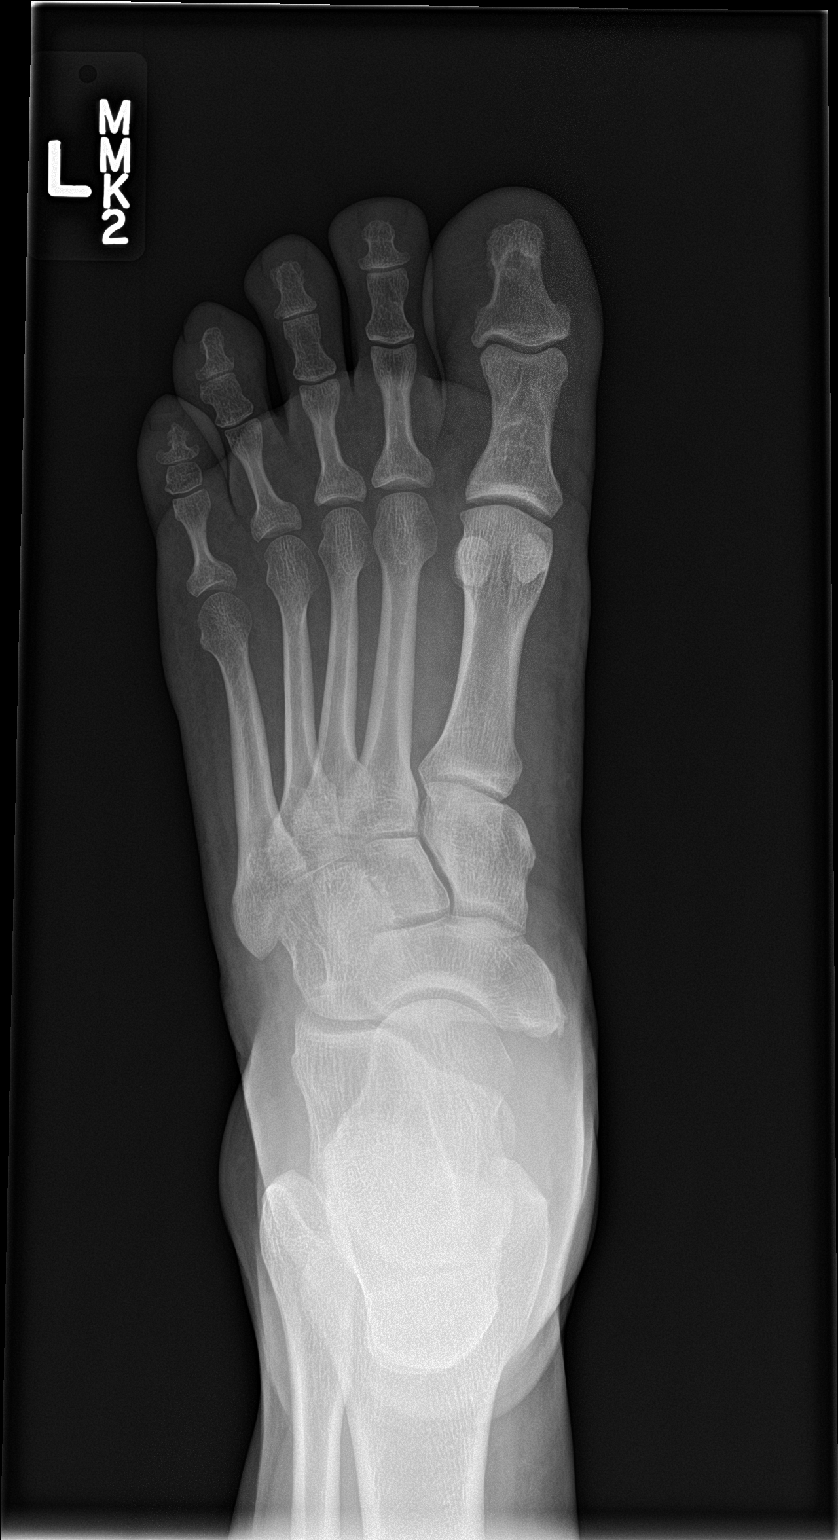

[foot obl]
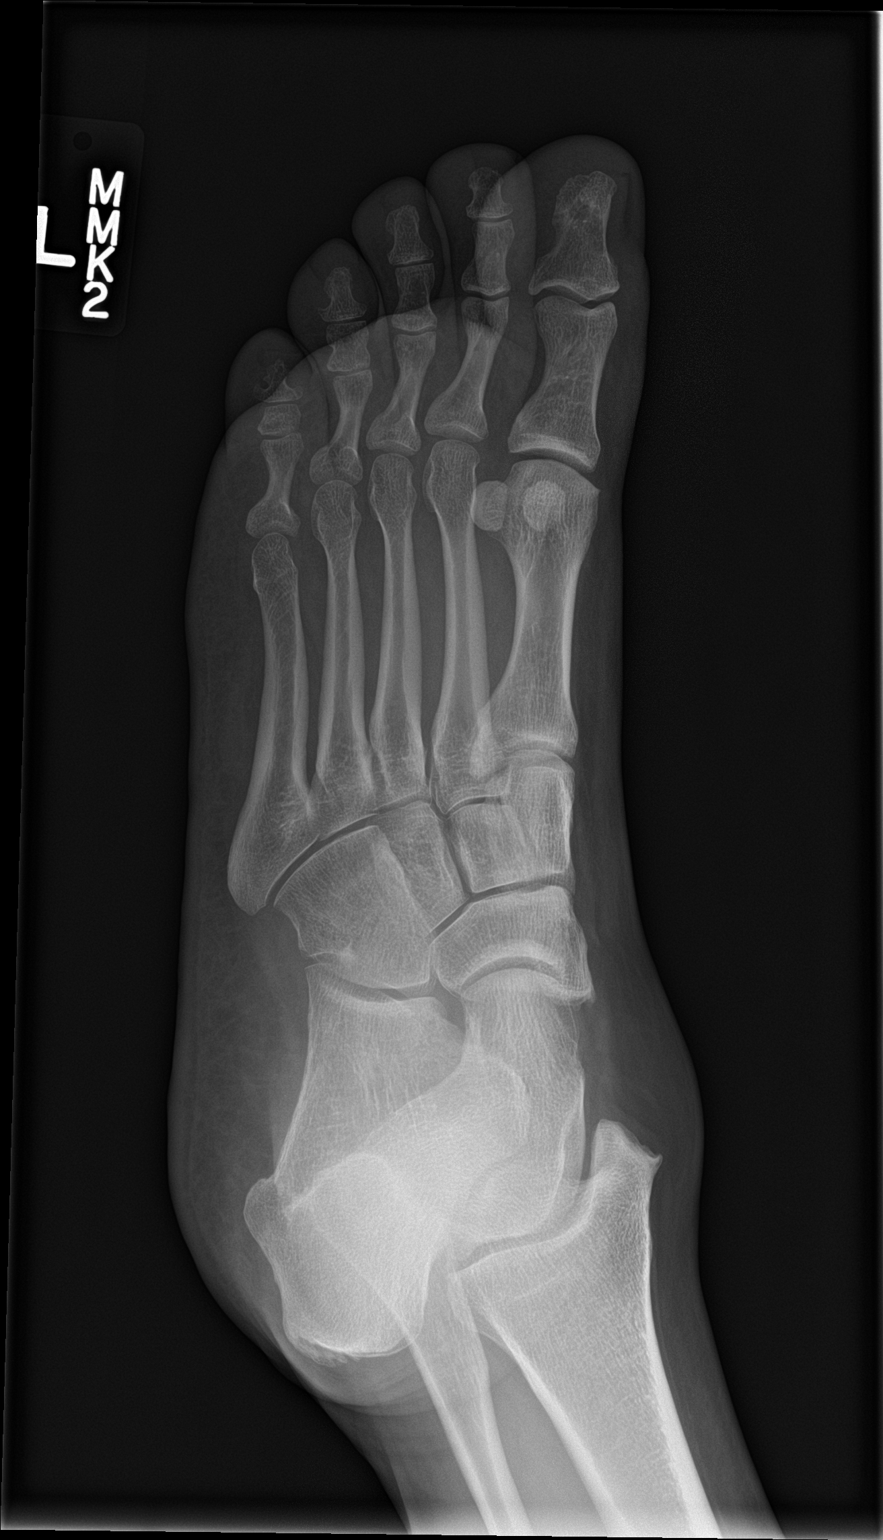

[foot lat]
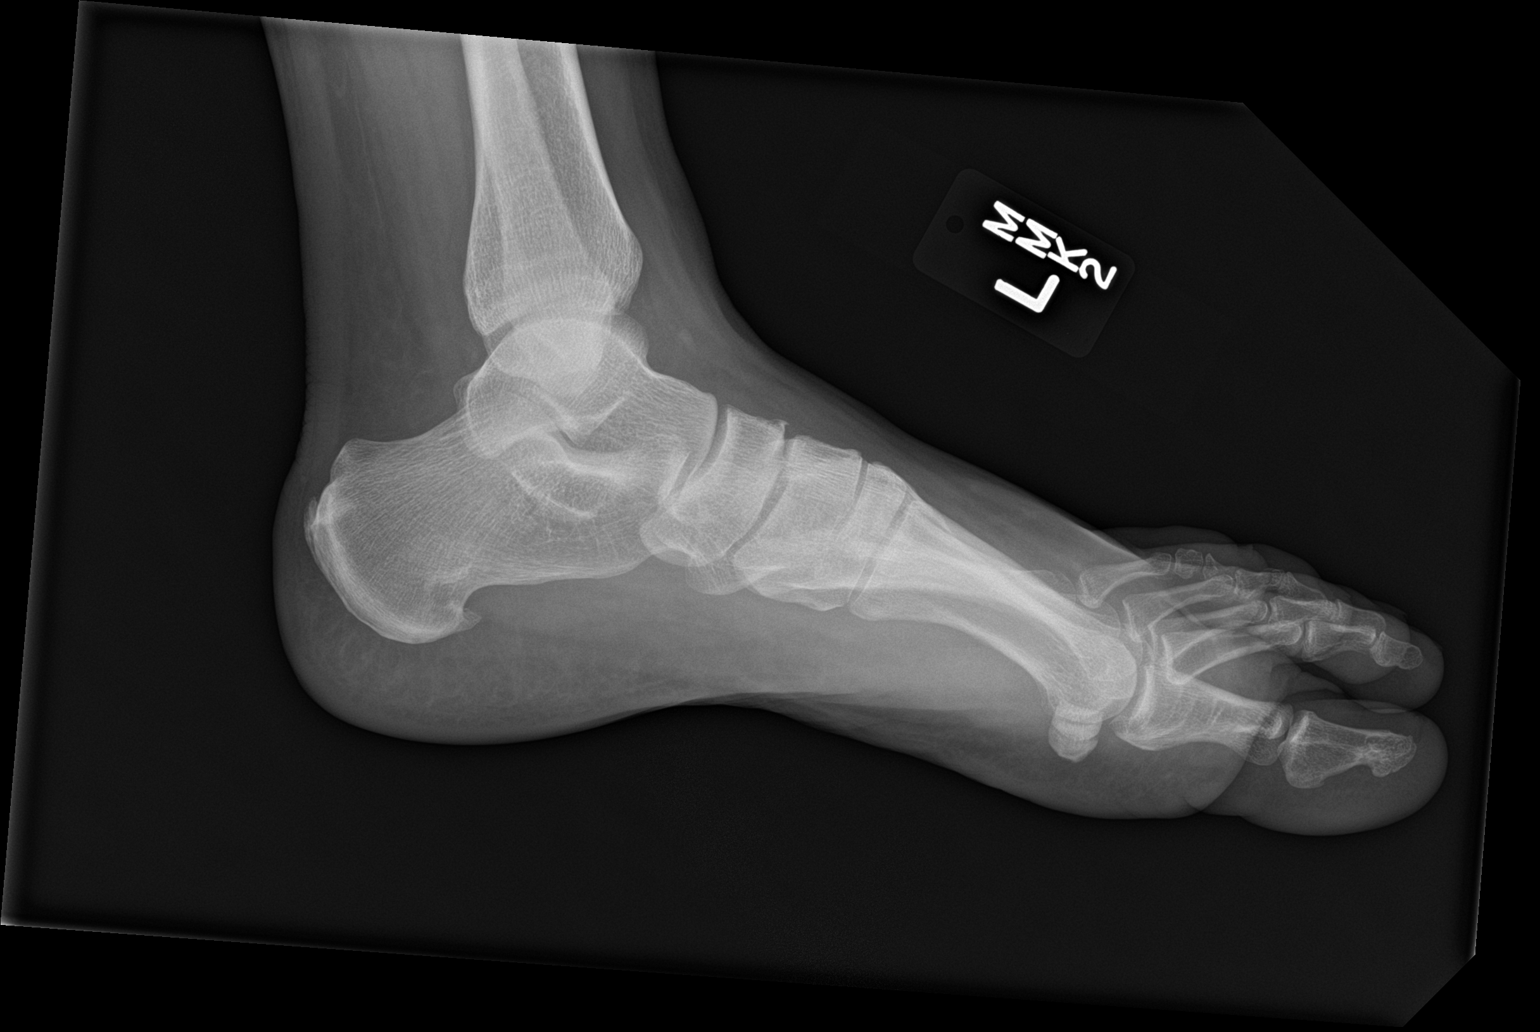

[3 of 3 positions shown; findings below may reference images not displayed]

FINDINGS: Imaged bones, joints and soft tissues appear normal. Small plantar
calcaneal spurs are noted.
IMPRESSION: Negative exam.

## 2015-02-19 MED ORDER — IBUPROFEN 400 MG PO TABS
800.0000 mg | ORAL_TABLET | Freq: Once | ORAL | Status: AC
  Administered 2015-02-19: 800 mg via ORAL
  Filled 2015-02-19: qty 2

## 2015-02-19 MED ORDER — NAPROXEN 500 MG PO TABS: 500.0000 mg | ORAL_TABLET | Freq: Two times a day (BID) | ORAL | Status: DC

## 2015-02-19 NOTE — ED Notes (Signed)
Pt reports injury to Lt great toe when the toe was caught in rug, No swelling or bruise noted at this time.

## 2015-02-19 NOTE — ED Provider Notes (Signed)
CSN: [id number]     Arrival date & time 02/19/15  1835 History  This chart is scribed for non-physician practitioner, Joycie Peek, PA-C, working with Jerelyn Scott, MD by Abel Presto, ED Scribe.  This patient was seen in room TR10C/TR10C and the patient's care was started 8:53 PM.     Chief Complaint  Patient presents with  . Toe Pain     Patient is a 39 y.o. female presenting with toe pain. The history is provided by the patient. No language interpreter was used.  Toe Pain   HPI Comments: Angela Castillo is a 39 y.o. female with PMHx HTN, iron deficiency, and anemia who presents to the Emergency Department complaining of constant pain to dorsal surface of left great toe with onset today. Pt first caught toe on rug causing her to trip 4-5 weeks ago and again today. Pt notes mild associated swelling. Pain rated 4/10. Pt has not taken any medications for relief. Pt denies bruising, numbness, and weakness. No other aggravating or modifying factors.  Past Medical History  Diagnosis Date  . Reflux   . Elevated liver enzymes   . Anemia Dx January 03 2015   Past Surgical History  Procedure Laterality Date  . Tubal ligation  Jul 25, 2000   Family History  Problem Relation Age of Onset  . COPD Mother   . Esophageal cancer Mother   . Diabetes Mother   . Cancer Mother   . Hypertension Mother   . Diabetes Father    History  Substance Use Topics  . Smoking status: Never Smoker   . Smokeless tobacco: Not on file  . Alcohol Use: No   OB History    No data available     Review of Systems  Musculoskeletal: Positive for myalgias, joint swelling and arthralgias.  Skin: Negative for color change.  Neurological: Negative for weakness and numbness.  All other systems reviewed and are negative.     Allergies  Review of patient's allergies indicates no known allergies.  Home Medications   Prior to Admission medications   Medication Sig Start Date End Date Taking?  Authorizing Provider  ferrous sulfate 325 (65 FE) MG tablet Take 325 mg by mouth 2 (two) times daily with a meal.    Historical Provider, MD  hydrochlorothiazide (HYDRODIURIL) 12.5 MG tablet Take 1 tablet (12.5 mg total) by mouth daily. 02/16/15   Doris Cheadle, MD  naproxen (NAPROSYN) 500 MG tablet Take 1 tablet (500 mg total) by mouth 2 (two) times daily. 02/19/15   Yasuo Phimmasone, PA-C   BP 154/99 mmHg  Pulse 95  Temp(Src) 98.7 F (37.1 C)  Resp 18  Wt 231 lb (104.781 kg)  SpO2 98%  LMP 02/01/2015 Physical Exam  Constitutional: She is oriented to person, place, and time. She appears well-developed and well-nourished.  HENT:  Head: Normocephalic.  Eyes: Conjunctivae are normal.  Neck: Normal range of motion. Neck supple.  Pulmonary/Chest: Effort normal.  Abdominal: Soft. There is no rebound.  Musculoskeletal: Normal range of motion.  Mild swelling to distal left great toe; tenderness diffusely throughout the anterior portion of phalanx; no overt erythema or warmth. ROM intact; sensation intact; distal pulses intact with brisk cap refill; no crepitus or deformities Mild tenderness to distal first metatarsal  Neurological: She is alert and oriented to person, place, and time.  Skin: Skin is warm and dry.  Psychiatric: She has a normal mood and affect. Her behavior is normal.  Nursing note and vitals reviewed.  ED Course  Procedures (including critical care time) DIAGNOSTIC STUDIES: Oxygen Saturation is 98% on room air, normal by my interpretation.    COORDINATION OF CARE: 7:29 PM Discussed treatment plan with patient at beside, the patient agrees with the plan and has no further questions at this time.   Labs Review Labs Reviewed - No data to display  Imaging Review Dg Foot Complete Left  02/19/2015   CLINICAL DATA:  Right great toe injury today. Pain about the dorsal aspect of the great toe. Initial encounter.  EXAM: LEFT FOOT - COMPLETE 3+ VIEW  COMPARISON:  None.   FINDINGS: Imaged bones, joints and soft tissues appear normal. Small plantar calcaneal spurs are noted.  IMPRESSION: Negative exam.   Electronically Signed   By: Drusilla Kannerhomas  Dalessio M.D.   On: 02/19/2015 19:53     EKG Interpretation None     Meds given in ED:  Medications  ibuprofen (ADVIL,MOTRIN) tablet 800 mg (not administered)    New Prescriptions   NAPROXEN (NAPROSYN) 500 MG TABLET    Take 1 tablet (500 mg total) by mouth 2 (two) times daily.   Filed Vitals:   02/19/15 1852  BP: 154/99  Pulse: 95  Temp: 98.7 F (37.1 C)  Resp: 18  Weight: 231 lb (104.781 kg)  SpO2: 98%    MDM  Vitals stable - WNL -afebrile Pt resting comfortably in ED. PE--mild tenderness over anterior left great toe and distal first metatarsal. No erythema, gross joint edema or warmth. Able to bear weight with some discomfort. Imaging--x-ray of left foot shows no acute fracture or dislocation.  DDX--no evidence of hemarthrosis, vascular compromise. Patient likely experienced pain secondary to musculoskeletal strain. Will DC with anti-inflammatories and encouraged symptomatic care at home  I discussed all relevant lab findings and imaging results with pt and they verbalized understanding. Discussed f/u with PCP within 48 hrs and return precautions, pt very amenable to plan.  Final diagnoses:  Great toe pain, left    I personally performed the services described in this documentation, which was scribed in my presence. The recorded information has been reviewed and is accurate.     Joycie PeekBenjamin Marnesha Gagen, PA-C 02/19/15 2056  Jerelyn ScottMartha Linker, MD 02/19/15 [phone]

## 2015-02-19 NOTE — Discharge Instructions (Signed)
You were evaluated in the ED today for your toe pain. Your x-rays did not show any evidence of acute fracture or dislocation. You likely has sprained one of the ligaments in your toe. It is important for you to continue taking anti-inflammatories and exercising the toe. Please follow-up with her primary care for further evaluation and management of your symptoms. Return to ED for new or worsening symptoms.

## 2015-03-01 ENCOUNTER — Ambulatory Visit: Payer: PRIVATE HEALTH INSURANCE | Attending: Family Medicine | Admitting: Family Medicine

## 2015-03-01 ENCOUNTER — Encounter: Payer: Self-pay | Admitting: Family Medicine

## 2015-03-01 VITALS — BP 139/100 | HR 81 | Temp 99.1°F | Resp 18 | Ht 63.0 in | Wt 233.0 lb

## 2015-03-01 DIAGNOSIS — H9191 Unspecified hearing loss, right ear: Secondary | ICD-10-CM

## 2015-03-01 DIAGNOSIS — Z124 Encounter for screening for malignant neoplasm of cervix: Secondary | ICD-10-CM

## 2015-03-01 DIAGNOSIS — Z833 Family history of diabetes mellitus: Secondary | ICD-10-CM

## 2015-03-01 DIAGNOSIS — I10 Essential (primary) hypertension: Secondary | ICD-10-CM

## 2015-03-01 LAB — POCT GLYCOSYLATED HEMOGLOBIN (HGB A1C): Hemoglobin A1C: 5.6

## 2015-03-01 LAB — GLUCOSE, POCT (MANUAL RESULT ENTRY): POC Glucose: 126 mg/dl — AB (ref 70–99)

## 2015-03-01 MED ORDER — FLUTICASONE PROPIONATE 50 MCG/ACT NA SUSP: 2.0000 | Freq: Every day | NASAL | Status: DC

## 2015-03-01 MED ORDER — HYDROCHLOROTHIAZIDE 25 MG PO TABS: 25.0000 mg | ORAL_TABLET | Freq: Every day | ORAL | Status: DC

## 2015-03-01 NOTE — Progress Notes (Signed)
   Subjective:    Patient ID: Angela FothergillMichelle D Castillo, female    DOB: 09-Dec-1975, 39 y.o.   MRN: [id number] CC: pap and HTN f/u  HPI  1. CHRONIC HYPERTENSION  Disease Monitoring  Blood pressure range:  Not checking   Chest pain: no   Dyspnea: no   Claudication: no   Medication compliance: yes  Medication Side Effects  Lightheadedness: no   Urinary frequency: no   Edema: no    Preventitive Healthcare:  Exercise: no   Diet Pattern: regulars meals   Salt Restriction: no   2. Due for pap: no history of abnormal pap smears. Currently on menstrual period.   3. Decreased hearing in R ear: for 6 months. No new meds at time. No trauma. No dizziness. No ringing in ears.  4. Mom with diabetes: patient wants to know if she has DM2 or is at risk. Exercising a little bit. Working to lose weight.   Soc Hx: non smoker  Review of Systems  Respiratory: Negative for shortness of breath.   Cardiovascular: Negative for chest pain.  Genitourinary: Positive for vaginal bleeding. Negative for menstrual problem.      Objective:   Physical Exam BP 139/100 mmHg  Pulse 81  Temp(Src) 99.1 F (37.3 C) (Oral)  Resp 18  Ht 5\' 3"  (1.6 m)  Wt 233 lb (105.688 kg)  BMI 41.28 kg/m2  SpO2 97%  LMP 02/27/2015  Wt Readings from Last 3 Encounters:  03/01/15 233 lb (105.688 kg)  02/19/15 231 lb (104.781 kg)  02/16/15 237 lb (107.502 kg)   BP Readings from Last 3 Encounters:  03/01/15 139/100  02/19/15 141/90  02/16/15 150/76    General appearance: alert, cooperative, no distress and morbidly obese Lungs: clear to auscultation bilaterally Heart: regular rate and rhythm, S1, S2 normal, no murmur, click, rub or gallop  Pelvic: cervix normal in appearance, external genitalia normal, no adnexal masses or tenderness, no cervical motion tenderness, positive findings: vaginal discharge:  bloody, rectovaginal septum normal and uterus normal size, shape, and consistency Extremities: extremities normal,  atraumatic, no cyanosis or edema  Lab Results  Component Value Date   HGBA1C 5.60 03/01/2015       Assessment & Plan:

## 2015-03-01 NOTE — Assessment & Plan Note (Signed)
Pap done today. You will be called with results 

## 2015-03-01 NOTE — Assessment & Plan Note (Signed)
Diabetes due to mom with pre-diabetes: A1c and CBG  Today.   Normal A1c and CBG

## 2015-03-01 NOTE — Progress Notes (Signed)
F/U HTN Annual Pap, last pap smear normal results Stated menses started two day ago

## 2015-03-01 NOTE — Patient Instructions (Addendum)
Ms. Catha NottinghamVanherpen,  Thank you for coming in today.  1. HTN:  Goal BP < 140/90  Increase HCTZ to 25 mg once daily   2. Pap done today. You will be called with results  3. R hearing loss: audiology assessment. Flonase.  4. ? Diabetes due to mom with pre-diabetes: A1c and CBG  Today.   F/u in 6 weeks with RN for BP check  F/u with me in 3 months   Dr. Armen PickupFunches

## 2015-03-01 NOTE — Assessment & Plan Note (Signed)
1. HTN:  Goal BP < 140/90  Increase HCTZ to 25 mg once daily

## 2015-03-01 NOTE — Assessment & Plan Note (Signed)
R hearing loss: audiology assessment. Flonase.

## 2015-03-02 LAB — CERVICOVAGINAL ANCILLARY ONLY
Chlamydia: NEGATIVE
Neisseria Gonorrhea: NEGATIVE
Wet Prep (BD Affirm): NEGATIVE

## 2015-03-03 LAB — CYTOLOGY - PAP

## 2015-03-08 ENCOUNTER — Telehealth: Payer: Self-pay | Admitting: *Deleted

## 2015-03-08 NOTE — Telephone Encounter (Signed)
-----   Message from Dessa PhiJosalyn Funches, MD sent at 03/02/2015  8:47 AM EDT ----- Negative wet prep.  Negative GC/chlam

## 2015-03-08 NOTE — Telephone Encounter (Signed)
Pt aware of results 

## 2015-03-08 NOTE — Telephone Encounter (Signed)
-----   Message from Dessa PhiJosalyn Funches, MD sent at 03/06/2015 12:51 PM EDT ----- Pap normal

## 2015-04-05 ENCOUNTER — Ambulatory Visit: Payer: No Typology Code available for payment source | Attending: Audiology | Admitting: Audiology

## 2015-04-05 DIAGNOSIS — H93211 Auditory recruitment, right ear: Secondary | ICD-10-CM

## 2015-04-05 DIAGNOSIS — H9193 Unspecified hearing loss, bilateral: Secondary | ICD-10-CM | POA: Insufficient documentation

## 2015-04-05 DIAGNOSIS — H9012 Conductive hearing loss, unilateral, left ear, with unrestricted hearing on the contralateral side: Secondary | ICD-10-CM

## 2015-04-05 DIAGNOSIS — H905 Unspecified sensorineural hearing loss: Secondary | ICD-10-CM

## 2015-04-05 NOTE — Procedures (Signed)
[facility] [address] [place], [place] [address] [phone]  AUDIOLOGICAL EVALUATION Name: Angela Castillo DOB:  [birth year] MRN:  [id number]                              Diagnosis: Hearing loss right side Date: 04/05/2015   Referent: Lora PaulaFUNCHES, JOSALYN C, MD  HISTORY: Angela FothergillMichelle D Kryder, age 39 y.o. years, was seen for an audiological evaluation. She reports right ear hearing loss with an intermittent "plugged up feeling" and "sound sensitivity".  Ms. Catha NottinghamVanherpen has been employed at [employer] and states that over the past couple of years the "beeping sounds" seem "so loud" that she is uncomfortable and feels the need to turn them off immediately.  She states that her "blood pressure is currently being monitored".  EVALUATION: Pure tone air and tone conduction was completed using conventional audiometry with inserts. Symmetrical hearing thresholds that are 45 dBHL at 125Hz ; 35-40 dBHL at 250Hz  and 10-15 dBHL from 500Hz  - 8000Hz  bilaterally.  The low frequency hearing loss is sensorineural on the right side and conductive on the left side. Speech reception thresholds are 15 dBHL in each ear using recorded spondee words.  The reliability is good. Word recognition is 100% at 55dBHL in each ear using recorded NU-6 word lists in quiet. In minimal background noise with +5dB signal to noise ratio word recognition is 76 % in the right ear and 72% in the left ear. Otoscopic inspection reveals clear ear canals with visible tympanic membranes.  Tympanometry showed in the right ear was within normal limits for volume and compliance (Type A) but the left ear had an abnormal configuration because of the long left leg that is consistent with conductive issues.  Ipsilateral acoustic reflexes are elevated and absent on the left side but are most present on the right side with an elevated acoustic reflex at 4000Hz  only.  Please note that Ms. Alfieri reported  a piercing spinning sensation on the right side during acoustic reflex testing at 4000Hz . Distortion Product Otoacoustic Emission (DPOAE) testing from 2000Hz  - 10,000Hz   are present bilaterally but are weak in the higher frequencies - repeat testing an monitoring is recommended.  CONCLUSION:      Angela FothergillMichelle D Fitzpatrick has a moderate low frequency hearing loss bilaterally. However, the hearing loss on the right side is sensorineural and the hearing loss on the left side is conductive.  Her hearing loss is symmetrical and is within normal limits from 500Hz  - 8000Hz  bilaterally.  She has excellent word recognition in quiet.  In minimal background noise her word recognition drops to fair in each ear.  The middle ear function is abnormal on the left side which agrees with the conductive component.  The right ear sensorineural hearing loss is unusual.  She notes intermittent right ear "plugged up feeling" and during testing the right ear had sound sensitivity (recruitment) to the point that during acoustic reflex testing she experienced an "spinning sensation". Close monitoring of her hearing is needed to rule out a retrocochlear, fluctuating or progressive hearing loss.  RECOMMENDATIONS: 1)  Further evaluation by an Ear, Nose and Throat because of the right ear low frequency sensorineural hearing loss and sound sensitivity (with vertigo component) as well as for the left ear low frequency conductive component.  2)  Close monitoring of hearing with a repeat hearing evaluation in 3 months.  This test may be completed here or at the  ENT office but is currently scheduled here for  Sept. 7, 2016 at 9:45am. Monitor hearing thresholds, acoustic reflexes and if worse consider an auditory brainstem evoked response test to further evaluate retrocochlear function.  3) Seek immediate medical attention for a change in hearing or sudden hearing loss.   Mattia Osterman L. Kate Sable, Au.D., CCC-A Doctor of Audiology    04/05/2015  cc: Lora Paula, MD

## 2015-04-05 NOTE — Patient Instructions (Addendum)
Angela Castillo has a moderate low frequency hearing loss bilaterally. However, the hearing loss on the right side is sensorineural and the hearing loss on the left side is conductive.  Her hearing loss is symmetrical and is within normal limits from 500Hz  - 8000Hz  bilaterally.  She has excellent word recognition in quiet.  In minimal background noise her word recognition drops to fair in each ear.  The middle ear function is abnormal on the left side which agrees with the conductive component.  The right ear sensorineural hearing loss is unusual.  She notes intermittant right ear "plugged up feeling" and during testing the right ear had sound sensitivity to the point that during acoustic reflex testing she experienced an "spinning sensation".   Recommendations: 1)  Further evaluation by an Ear, Nose and Throat because of the right ear sensorineural hearing loss and sound sensitivity as well as for the left ear conductive component.  2)  Close monitoring of hearing with a repeat hearing evaluation in 3 months.  This test may be completed here or at the ENT office but is currently scheduled here for  Sept. 7, 2016 at 9:45am.  3) Seek immediate medical attention for a change in hearing or sudden hearing loss.  Deborah L. Heide Spark, Au.D., CCC-A Doctor of Audiology

## 2015-04-19 ENCOUNTER — Ambulatory Visit: Payer: No Typology Code available for payment source | Attending: Family Medicine | Admitting: *Deleted

## 2015-04-19 VITALS — BP 118/79 | HR 95 | Temp 99.0°F | Resp 20

## 2015-04-19 DIAGNOSIS — I10 Essential (primary) hypertension: Secondary | ICD-10-CM

## 2015-04-19 NOTE — Progress Notes (Signed)
Patient presents for BP check after increasing HCTZ to 25 mg daily Med list reviewed; states taking all meds as directed Trying to follow low sodium diet Has decreased soda intake as well Walking occasionally after work; states walks all day at work Patient denies, blurred vision, chest pain  Positive for occasional headaches and SHOB C/o 1-2 episodes of vertigo per day X 7 days lasting 5-10 minutes each; rates 1/10 on severity States under increased stress with mom going into hospice soon C/o "anxiety" centered mid chest Offered for patient to speak with LCSW but patient declined at this time  Filed Vitals:   04/19/15 1546  BP: 118/79  Pulse: 95  Temp: 99 F (37.2 C)  Resp: 20   WT 225.8lb  Patient advised to call for med refills at least 7 days before running out so as not to go without.  Patient aware that she is to f/u with PCP 3 months from last visit (Due 05/31/15)  Note routed to PCP

## 2015-04-21 ENCOUNTER — Encounter: Payer: Self-pay | Admitting: Family Medicine

## 2015-05-03 ENCOUNTER — Encounter: Payer: Self-pay | Admitting: Family Medicine

## 2015-05-03 ENCOUNTER — Ambulatory Visit: Payer: No Typology Code available for payment source | Attending: Family Medicine | Admitting: Family Medicine

## 2015-05-03 VITALS — BP 126/85 | HR 77 | Temp 99.1°F | Resp 16 | Ht 63.0 in | Wt 221.0 lb

## 2015-05-03 DIAGNOSIS — M79672 Pain in left foot: Secondary | ICD-10-CM | POA: Insufficient documentation

## 2015-05-03 DIAGNOSIS — M79671 Pain in right foot: Secondary | ICD-10-CM | POA: Insufficient documentation

## 2015-05-03 DIAGNOSIS — E669 Obesity, unspecified: Secondary | ICD-10-CM | POA: Insufficient documentation

## 2015-05-03 DIAGNOSIS — H9191 Unspecified hearing loss, right ear: Secondary | ICD-10-CM | POA: Insufficient documentation

## 2015-05-03 DIAGNOSIS — I10 Essential (primary) hypertension: Secondary | ICD-10-CM | POA: Insufficient documentation

## 2015-05-03 MED ORDER — HYDROCHLOROTHIAZIDE 25 MG PO TABS: 25.0000 mg | ORAL_TABLET | Freq: Every day | ORAL | Status: DC

## 2015-05-03 NOTE — Progress Notes (Signed)
   Subjective:    Patient ID: Angela Castillo, female    DOB: 1976/05/22, 39 y.o.   MRN: [id number] CC: f/u HTN, ENT referral  HPI  1. CHRONIC HYPERTENSION  Disease Monitoring  Blood pressure range: not checking   Chest pain: no   Dyspnea: no   Claudication: no   Medication compliance: yes  Medication Side Effects  Lightheadedness: no   Urinary frequency: no   Edema: no   Impotence: no   2. Hearing loss: has seen audiologist. Recommended to be referred to ENT. No worsening hearing loss.   3. Morbid obesity: working on weight loss. Eating low calorie. Walking more. Excited about weight loss.   4. Heel pain: b.l heel pain R>L. Has hx of plantar fascitis. Also has L great pain. Interested in seeing a podiatry.   Soc Hx: non smoker  Review of Systems  Constitutional: Negative for fever and chills.  Respiratory: Negative for shortness of breath.   Cardiovascular: Negative for chest pain, palpitations and leg swelling.  Musculoskeletal: Positive for myalgias.      Objective:   Physical Exam BP 126/85 mmHg  Pulse 77  Temp(Src) 99.1 F (37.3 C) (Oral)  Resp 16  Ht 5\' 3"  (1.6 m)  Wt 221 lb (100.245 kg)  BMI 39.16 kg/m2  SpO2 96%  LMP 04/01/2015  Wt Readings from Last 3 Encounters:  05/03/15 221 lb (100.245 kg)  03/01/15 233 lb (105.688 kg)  02/19/15 231 lb (104.781 kg)  General appearance: alert, cooperative and no distress Lungs: clear to auscultation bilaterally Heart: regular rate and rhythm, S1, S2 normal, no murmur, click, rub or gallop Extremities: extremities normal, atraumatic, no cyanosis or edema     Assessment & Plan:

## 2015-05-03 NOTE — Assessment & Plan Note (Signed)
1. Weight loss: Great job [employer]t Readings from Last 3 Encounters:  05/03/15 221 lb (100.245 kg)  03/01/15 233 lb (105.688 kg)  02/19/15 231 lb (104.781 kg)

## 2015-05-03 NOTE — Progress Notes (Signed)
F/U HTN No Hx Tobacco Referral ENT

## 2015-05-03 NOTE — Assessment & Plan Note (Signed)
HTN: BP at goal Continue HCTZ

## 2015-05-03 NOTE — Patient Instructions (Signed)
[name],  Thank you for coming in today  1. Weight loss: Great job [employer] Readings from Last 3 Encounters:  05/03/15 221 lb (100.245 kg)  03/01/15 233 lb (105.688 kg)  02/19/15 231 lb (104.781 kg)    2. HTN: BP at goal Continue HCTZ  3. Hearing loss: ENT referral placed  4. Heel pain: podiatry referral placed  F/u in 3 months for hypertension  Dr. Armen PickupFunches

## 2015-05-03 NOTE — Assessment & Plan Note (Signed)
Hearing loss: ENT referral placed

## 2015-05-03 NOTE — Assessment & Plan Note (Signed)
Heel pain: podiatry referral placed

## 2015-06-12 ENCOUNTER — Encounter: Payer: Self-pay | Admitting: Family Medicine

## 2015-06-12 ENCOUNTER — Ambulatory Visit: Payer: No Typology Code available for payment source | Attending: Family Medicine | Admitting: Family Medicine

## 2015-06-12 VITALS — BP 134/83 | HR 76 | Temp 99.1°F | Resp 16 | Ht 63.0 in | Wt 225.0 lb

## 2015-06-12 DIAGNOSIS — D649 Anemia, unspecified: Secondary | ICD-10-CM | POA: Insufficient documentation

## 2015-06-12 DIAGNOSIS — R42 Dizziness and giddiness: Secondary | ICD-10-CM | POA: Insufficient documentation

## 2015-06-12 DIAGNOSIS — I10 Essential (primary) hypertension: Secondary | ICD-10-CM | POA: Insufficient documentation

## 2015-06-12 LAB — POCT GLYCOSYLATED HEMOGLOBIN (HGB A1C): Hemoglobin A1C: 5.5

## 2015-06-12 LAB — GLUCOSE, POCT (MANUAL RESULT ENTRY): POC Glucose: 87 mg/dl (ref 70–99)

## 2015-06-12 NOTE — Progress Notes (Signed)
Dizziness x 2week Stated was at work and felt dizzy land sweating  Had an orange juice and seem to get better

## 2015-06-12 NOTE — Patient Instructions (Addendum)
Ms. Chappelle,  Thank you for coming in today  1. Dizzy spell on 05/18/15 Normal exam today You will be called with lab results  You BP also could have dropped while a work, I know you try not to but please continue to stay well hydrated.  Letter provided for work  Dr. Armen Pickup

## 2015-06-13 LAB — CBC
HCT: 37.8 % (ref 36.0–46.0)
Hemoglobin: 12.3 g/dL (ref 12.0–15.0)
MCH: 26.7 pg (ref 26.0–34.0)
MCHC: 32.5 g/dL (ref 30.0–36.0)
MCV: 82 fL (ref 78.0–100.0)
MPV: 11.3 fL (ref 8.6–12.4)
Platelets: 150 K/uL (ref 150–400)
RBC: 4.61 MIL/uL (ref 3.87–5.11)
RDW: 15.6 % — ABNORMAL HIGH (ref 11.5–15.5)
WBC: 9.2 K/uL (ref 4.0–10.5)

## 2015-06-13 LAB — LIPID PANEL
Cholesterol: 192 mg/dL (ref 125–200)
HDL: 24 mg/dL — ABNORMAL LOW
LDL Cholesterol: 140 mg/dL — ABNORMAL HIGH
Total CHOL/HDL Ratio: 8 ratio — ABNORMAL HIGH
Triglycerides: 142 mg/dL
VLDL: 28 mg/dL

## 2015-06-13 LAB — BASIC METABOLIC PANEL
BUN: 16 mg/dL (ref 7–25)
CO2: 25 mmol/L (ref 20–31)
Calcium: 9.6 mg/dL (ref 8.6–10.2)
Chloride: 103 mmol/L (ref 98–110)
Creat: 0.55 mg/dL (ref 0.50–1.10)
Glucose, Bld: 81 mg/dL (ref 65–99)
Potassium: 4.3 mmol/L (ref 3.5–5.3)
Sodium: 142 mmol/L (ref 135–146)

## 2015-06-13 LAB — VITAMIN D 25 HYDROXY (VIT D DEFICIENCY, FRACTURES): Vit D, 25-Hydroxy: 32 ng/mL (ref 30–100)

## 2015-06-13 NOTE — Progress Notes (Signed)
   Subjective:    Patient ID: Angela Castillo, female    DOB: January 25, 1976, 39 y.o.   MRN: [id number] CC: dizziness  HPI 39 yo F with hx of HTN and anemia  1. Dizziness: acute onset while at work 2 weeks ago. Felt a heaviness overcome her. Did not fall. No CP or SOB. No recent heavy menstrual bleeding. Taking iron. Taking HCTZ. Sometimes does not drink much water to avoid having to urinate while at work.  History  Substance Use Topics  . Smoking status: Never Smoker   . Smokeless tobacco: Not on file  . Alcohol Use: No   Review of Systems  Constitutional: Negative for fever and chills.  Eyes: Negative for visual disturbance.  Respiratory: Negative for shortness of breath.   Cardiovascular: Negative for chest pain.  Gastrointestinal: Negative for abdominal pain and blood in stool.  Musculoskeletal: Negative for back pain and arthralgias.  Skin: Negative for rash.  Allergic/Immunologic: Negative for immunocompromised state.  Neurological: Positive for dizziness.  Hematological: Negative for adenopathy. Does not bruise/bleed easily.  Psychiatric/Behavioral: Negative for suicidal ideas and dysphoric mood.       Objective:   Physical Exam  Constitutional: She is oriented to person, place, and time. She appears well-developed and well-nourished. No distress.  HENT:  Head: Normocephalic and atraumatic.  Cardiovascular: Normal rate, regular rhythm, normal heart sounds and intact distal pulses.   Pulmonary/Chest: Effort normal and breath sounds normal.  Musculoskeletal: She exhibits no edema.  Neurological: She is alert and oriented to person, place, and time.  Skin: Skin is warm and dry. No rash noted.  Psychiatric: She has a normal mood and affect.  BP 134/83 mmHg  Pulse 76  Temp(Src) 99.1 F (37.3 C) (Oral)  Resp 16  Ht  (1.6 m)  Wt 225 lb (102.059 kg)  BMI 39.87 kg/m2  SpO2 99%  LMP 05/19/2015   Orthostatic VS normal   Lab Results  Component Value Date   HGBA1C 5.50 06/12/2015   CBG normal        Assessment & Plan:

## 2015-06-13 NOTE — Assessment & Plan Note (Signed)
Dizzy spell on 05/18/15 Normal exam today You will be called with lab results  You BP also could have dropped while a work, I know you try not to but please continue to stay well hydrated.  Letter provided for work

## 2015-06-28 ENCOUNTER — Ambulatory Visit: Payer: No Typology Code available for payment source

## 2015-06-28 ENCOUNTER — Ambulatory Visit: Payer: No Typology Code available for payment source | Admitting: Podiatry

## 2015-06-28 ENCOUNTER — Encounter: Payer: Self-pay | Admitting: Podiatry

## 2015-06-28 VITALS — BP 145/96 | HR 69 | Resp 12

## 2015-06-28 DIAGNOSIS — R52 Pain, unspecified: Secondary | ICD-10-CM

## 2015-06-28 DIAGNOSIS — M722 Plantar fascial fibromatosis: Secondary | ICD-10-CM

## 2015-06-28 DIAGNOSIS — S90122A Contusion of left lesser toe(s) without damage to nail, initial encounter: Secondary | ICD-10-CM

## 2015-06-28 NOTE — Patient Instructions (Signed)
When standing always keep your shoes on with stretching Okay to use over-the-counter ibuprofen 200 mg 3 times a day 7-14 days Exercise 1-2 minutes several sets a day if possible If symptoms are not improving in next 6 weeks present to office for further evaluation  Plantar Fasciitis (Heel Spur Syndrome) with Rehab The plantar fascia is a fibrous, ligament-like, soft-tissue structure that spans the bottom of the foot. Plantar fasciitis is a condition that causes pain in the foot due to inflammation of the tissue. SYMPTOMS   Pain and tenderness on the underneath side of the foot.  Pain that worsens with standing or walking. CAUSES  Plantar fasciitis is caused by irritation and injury to the plantar fascia on the underneath side of the foot. Common mechanisms of injury include:  Direct trauma to bottom of the foot.  Damage to a small nerve that runs under the foot where the main fascia attaches to the heel bone.  Stress placed on the plantar fascia due to bone spurs. RISK INCREASES WITH:   Activities that place stress on the plantar fascia (running, jumping, pivoting, or cutting).  Poor strength and flexibility.  Improperly fitted shoes.  Tight calf muscles.  Flat feet.  Failure to warm-up properly before activity.  Obesity. PREVENTION  Warm up and stretch properly before activity.  Allow for adequate recovery between workouts.  Maintain physical fitness:  Strength, flexibility, and endurance.  Cardiovascular fitness.  Maintain a health body weight.  Avoid stress on the plantar fascia.  Wear properly fitted shoes, including arch supports for individuals who have flat feet. PROGNOSIS  If treated properly, then the symptoms of plantar fasciitis usually resolve without surgery. However, occasionally surgery is necessary. RELATED COMPLICATIONS   Recurrent symptoms that may result in a chronic condition.  Problems of the lower back that are caused by  compensating for the injury, such as limping.  Pain or weakness of the foot during push-off following surgery.  Chronic inflammation, scarring, and partial or complete fascia tear, occurring more often from repeated injections. TREATMENT  Treatment initially involves the use of ice and medication to help reduce pain and inflammation. The use of strengthening and stretching exercises may help reduce pain with activity, especially stretches of the Achilles tendon. These exercises may be performed at home or with a therapist. Your caregiver may recommend that you use heel cups of arch supports to help reduce stress on the plantar fascia. Occasionally, corticosteroid injections are given to reduce inflammation. If symptoms persist for greater than 6 months despite non-surgical (conservative), then surgery may be recommended.  MEDICATION   If pain medication is necessary, then nonsteroidal anti-inflammatory medications, such as aspirin and ibuprofen, or other minor pain relievers, such as acetaminophen, are often recommended.  Do not take pain medication within 7 days before surgery.  Prescription pain relievers may be given if deemed necessary by your caregiver. Use only as directed and only as much as you need.  Corticosteroid injections may be given by your caregiver. These injections should be reserved for the most serious cases, because they may only be given a certain number of times. HEAT AND COLD  Cold treatment (icing) relieves pain and reduces inflammation. Cold treatment should be applied for 10 to 15 minutes every 2 to 3 hours for inflammation and pain and immediately after any activity that aggravates your symptoms. Use ice packs or massage the area with a piece of ice (ice massage).  Heat treatment may be used prior to performing the stretching  and strengthening activities prescribed by your caregiver, physical therapist, or athletic trainer. Use a heat pack or soak the injury in warm  water. SEEK IMMEDIATE MEDICAL CARE IF:  Treatment seems to offer no benefit, or the condition worsens.  Any medications produce adverse side effects. EXERCISES RANGE OF MOTION (ROM) AND STRETCHING EXERCISES - Plantar Fasciitis (Heel Spur Syndrome) These exercises may help you when beginning to rehabilitate your injury. Your symptoms may resolve with or without further involvement from your physician, physical therapist or athletic trainer. While completing these exercises, remember:   Restoring tissue flexibility helps normal motion to return to the joints. This allows healthier, less painful movement and activity.  An effective stretch should be held for at least 30 seconds.  A stretch should never be painful. You should only feel a gentle lengthening or release in the stretched tissue. RANGE OF MOTION - Toe Extension, Flexion  Sit with your right / left leg crossed over your opposite knee.  Grasp your toes and gently pull them back toward the top of your foot. You should feel a stretch on the bottom of your toes and/or foot.  Hold this stretch for __________ seconds.  Now, gently pull your toes toward the bottom of your foot. You should feel a stretch on the top of your toes and or foot.  Hold this stretch for __________ seconds. Repeat __________ times. Complete this stretch __________ times per day.  RANGE OF MOTION - Ankle Dorsiflexion, Active Assisted  Remove shoes and sit on a chair that is preferably not on a carpeted surface.  Place right / left foot under knee. Extend your opposite leg for support.  Keeping your heel down, slide your right / left foot back toward the chair until you feel a stretch at your ankle or calf. If you do not feel a stretch, slide your bottom forward to the edge of the chair, while still keeping your heel down.  Hold this stretch for __________ seconds. Repeat __________ times. Complete this stretch __________ times per day.  STRETCH - Gastroc,  Standing  Place hands on wall.  Extend right / left leg, keeping the front knee somewhat bent.  Slightly point your toes inward on your back foot.  Keeping your right / left heel on the floor and your knee straight, shift your weight toward the wall, not allowing your back to arch.  You should feel a gentle stretch in the right / left calf. Hold this position for __________ seconds. Repeat __________ times. Complete this stretch __________ times per day. STRETCH - Soleus, Standing  Place hands on wall.  Extend right / left leg, keeping the other knee somewhat bent.  Slightly point your toes inward on your back foot.  Keep your right / left heel on the floor, bend your back knee, and slightly shift your weight over the back leg so that you feel a gentle stretch deep in your back calf.  Hold this position for __________ seconds. Repeat __________ times. Complete this stretch __________ times per day. STRETCH - Gastrocsoleus, Standing  Note: This exercise can place a lot of stress on your foot and ankle. Please complete this exercise only if specifically instructed by your caregiver.   Place the ball of your right / left foot on a step, keeping your other foot firmly on the same step.  Hold on to the wall or a rail for balance.  Slowly lift your other foot, allowing your body weight to press your heel down over  the edge of the step.  You should feel a stretch in your right / left calf.  Hold this position for __________ seconds.  Repeat this exercise with a slight bend in your right / left knee. Repeat __________ times. Complete this stretch __________ times per day.  STRENGTHENING EXERCISES - Plantar Fasciitis (Heel Spur Syndrome)  These exercises may help you when beginning to rehabilitate your injury. They may resolve your symptoms with or without further involvement from your physician, physical therapist or athletic trainer. While completing these exercises, remember:    Muscles can gain both the endurance and the strength needed for everyday activities through controlled exercises.  Complete these exercises as instructed by your physician, physical therapist or athletic trainer. Progress the resistance and repetitions only as guided. STRENGTH - Towel Curls  Sit in a chair positioned on a non-carpeted surface.  Place your foot on a towel, keeping your heel on the floor.  Pull the towel toward your heel by only curling your toes. Keep your heel on the floor.  If instructed by your physician, physical therapist or athletic trainer, add ____________________ at the end of the towel. Repeat __________ times. Complete this exercise __________ times per day. STRENGTH - Ankle Inversion  Secure one end of a rubber exercise band/tubing to a fixed object (table, pole). Loop the other end around your foot just before your toes.  Place your fists between your knees. This will focus your strengthening at your ankle.  Slowly, pull your big toe up and in, making sure the band/tubing is positioned to resist the entire motion.  Hold this position for __________ seconds.  Have your muscles resist the band/tubing as it slowly pulls your foot back to the starting position. Repeat __________ times. Complete this exercises __________ times per day.  Document Released: 10/21/2005 Document Revised: 01/13/2012 Document Reviewed: 02/02/2009 [facility] Patient Information 2015 [place], [place]. This information is not intended to replace advice given to you by your health care provider. Make sure you discuss any questions you have with your health care provider.

## 2015-06-28 NOTE — Progress Notes (Signed)
   Subjective:    Patient ID: Angela Castillo, female    DOB: 1976/06/25, 39 y.o.   MRN: [id number]  HPI  This patient presents complaining of a painful right heel and a painful left great toe. The right heel is been uncomfortable for 6 months aggravated with standing and walking and relieved with rest elevation. Patient is tried shoe changes icing and stretching the heels becoming more uncomfortable over time. 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 As result she is complaining of painful left hallux toe area. The left was improving until the recent injury in the past week .    Review of Systems  HENT: Positive for hearing loss and sinus pressure.   Genitourinary: Positive for frequency.  Musculoskeletal: Positive for back pain and joint swelling.  Neurological: Positive for headaches.       Objective:   Physical Exam  Orientated 3  Vascular: No peripheral edema noted bilaterally DP and PT pulses 2/4 bilaterally Capillary reflex immediate bilaterally  Neurological: Sensation to 10 g monofilament wire intact 5/5 bilaterally Vibratory sensation reactive bilaterally Ankle reflex equal and reactive bilaterally  Dermatological: Texture and turgor within normal limits bilaterally There is no skin lesions noted on the left hallux  Musculoskeletal: Palpable tenderness medial plantar insertional area right without any palpable lesions Plantar flexion hallux 5/5 right and 4/5 left Palpable tenderness left hallux interphalangeal  joint  X-ray examination weightbearing right foot dated 06/28/2015  Intact bony structures without fracture and/or dislocation Posterior and inferior calcaneal spurs  Radiographic impression: No acute bony abnormality noted in the right foot   X-ray examination weightbearing left foot dated 06/28/2015  Intact bony structures without fracture and/or dislocation noted  Radiographic impression: No acute bony abnormality noted in the left foot     Assessment & Plan:   Assessment: Satisfactory neurovascular status Plantar fasciitis right History of trauma and possible contusion left hallux   Plan: Today I reviewed the results of examination x-rays with patient. In regards to the right heel pain I described plantar fasciitis and described general treatment protocol and offered her a Kenalog Injection. She verbally consents  Skin is prepped with alcohol and Betadine and 10 mg of Kenalog mixed with 2.5 mg of plain Marcaine and 5 mg of plain Xylocaine were injected into the inferior heel right for Kenalog injection #1. Patient tolerated injection without any difficulty Home physical therapy was prescribed for follow-up treatment of plantar fasciitis patient was advised to return for symptoms do not improve in the next 6 weeks  I made patient aware today the x-ray demonstrated no fracture in the left hallux. At this time I'm just recommending avoidance of any further trauma to the left hallux  Reappoint at patient's request

## 2015-06-29 DIAGNOSIS — M722 Plantar fascial fibromatosis: Secondary | ICD-10-CM

## 2015-06-29 MED ORDER — TRIAMCINOLONE ACETONIDE 10 MG/ML IJ SUSP
10.0000 mg | Freq: Once | INTRAMUSCULAR | Status: AC
  Administered 2015-06-29: 10 mg

## 2015-07-12 ENCOUNTER — Ambulatory Visit: Payer: No Typology Code available for payment source | Attending: Audiology | Admitting: Audiology

## 2015-07-12 DIAGNOSIS — H9191 Unspecified hearing loss, right ear: Secondary | ICD-10-CM | POA: Insufficient documentation

## 2015-07-12 DIAGNOSIS — R94128 Abnormal results of other function studies of ear and other special senses: Secondary | ICD-10-CM | POA: Insufficient documentation

## 2015-07-12 DIAGNOSIS — R292 Abnormal reflex: Secondary | ICD-10-CM | POA: Insufficient documentation

## 2015-07-12 DIAGNOSIS — Z01118 Encounter for examination of ears and hearing with other abnormal findings: Secondary | ICD-10-CM | POA: Insufficient documentation

## 2015-07-12 DIAGNOSIS — Z01110 Encounter for hearing examination following failed hearing screening: Secondary | ICD-10-CM | POA: Insufficient documentation

## 2015-07-12 NOTE — Procedures (Signed)
[facility] [address] [place], [place] [address] [phone]  AUDIOLOGICAL EVALUATION Name: Angela Castillo DOB: [birth year] MRN: [id number] Diagnosis: Hearing loss right side Date: 9/7/2016Referent: Lora Paula, MD  HISTORY: Angela Castillo, age 39 y.o. years, was seen for a follow-up audiological evaluation. She was previously seen here on 04/05/2015 with a "moderate low frequency hearing loss bilaterally...(with) the hearing loss on the right side sensorineural and the hearing loss on the left side conductive" (with) symmetrical and within normal limits hearing from 500Hz  - 8000Hz  bilaterally".  Angela Castillo continues to report an intermittent right ear "plugged up feeling" that lasts for several hours that has not become worse, but does continue to happen.   Significant past history is that Angela Castillo has been employed at [employer] and states that over the past couple of years the "beeping sounds" seem "so loud" that she is uncomfortable and feels the need to turn them off immediately. She states that her "blood pressure is currently being monitored".  EVALUATION: Pure tone air and tone conduction was completed using conventional audiometry with inserts. Left ear hearing thresholds have improved from the previous test results and are 15-20 dBHL from 125Hz  - 8000Hz .  Right ear hearing thresholds are similar to previous results and are 40 dBHL at 125Hz ; 35 dBHL at 250Hz  and 15-20 dBHL from 500Hz  - 8000Hz  bilaterally. The low frequency hearing loss appears conductive on the right side today, but previously is appeared sensorineural. Speech reception thresholds are 15 dBHL in each ear using recorded spondee words. The reliability is good. Word recognition is 100% at 55dBHL in each ear using recorded NU-6 word lists in quiet. In minimal background  noise with +5dB signal to noise ratio word recognition is 80 % in the right ear and 76% in the left ear which is slightly better, but consistent with the previous results. Otoscopic inspection reveals clear ear canals with visible tympanic membranes. Tympanometry showed in the right ear was within normal limits for volume but slightly shallow for compliance (Type As).  The left ear had an abnormal configuration because of the long left leg that is consistent with conductive issues. Ipsilateral and contralateral acoustic reflexes are elevated and absent on the left side but on the right side are present with normal and elevated responses. Note that Angela Castillo previously reported a piercing spinning sensation on the right side during acoustic reflex testing at 4000Hz  but did not report any discomfort today except during eustachian tube function testing on the right side.  CONCLUSION:   Although overall the test results are stable to improved, Angela Castillo continues to have some abnormal findings: 1) The right ear continues to have a mild low frequency hearing loss but today it was conductive and in June 2016 it appeared sensorineural.  During the eustachian tube function test on the right ear only - Angela Castillo reported "dizziness".  2) The left ear low frequency hearing thresholds have improved and are now within normal limits but the left middle ear function continues to be abnormal for tympanic membrane movement with abnormal acoustic reflexes.    Comparing the June 2016 to today's test results -although there was some previous concern about retrocochlear function, since the results are stable to slightly improved - retrocochlear concerns seem unlikely.  However, the persistent right low frequency hearing loss and the unusual left middle tympanic membrane mobility with elevated acoustic reflexes on the left side are abnormal.  The intermittant "dizziness"  and Angela Castillo also  reported during eustachian function testing on the right side also cannot be explained.   RECOMMENDATIONS: 1) Further evaluation by an Ear, Nose and Throat because of 1) the persistent right ear low frequency hearing loss with reports of the eustachian tube function test making her a little dizzy on the right side and 2) persistent abnormal middle ear function on the left side with elevated and absent acoustic reflexes. Please note that the left ear hearing thresholds appear to fluctuate.  2) Close monitoring of hearing with a repeat hearing evaluation in 3 months is recommended.  This test has not been scheduled pending the primary's decision about where to follow-up.  3) Seek immediate medical attention for a change in hearing or sudden hearing loss.  Abdallah Hern L. Kate Sable, Au.D., CCC-A Doctor of Audiology

## 2015-08-02 ENCOUNTER — Ambulatory Visit: Payer: Self-pay

## 2015-08-02 ENCOUNTER — Ambulatory Visit: Payer: No Typology Code available for payment source

## 2015-08-02 ENCOUNTER — Ambulatory Visit: Payer: Self-pay | Attending: Family Medicine | Admitting: Pharmacist

## 2015-08-02 DIAGNOSIS — Z23 Encounter for immunization: Secondary | ICD-10-CM | POA: Insufficient documentation

## 2015-08-02 MED ORDER — INFLUENZA VAC SPLIT QUAD 0.5 ML IM SUSY
0.5000 mL | PREFILLED_SYRINGE | Freq: Once | INTRAMUSCULAR | Status: AC
  Administered 2015-08-02: 0.5 mL via INTRAMUSCULAR

## 2015-08-02 NOTE — Patient Instructions (Signed)

## 2015-08-09 ENCOUNTER — Encounter: Payer: Self-pay | Admitting: Pharmacist

## 2015-08-09 ENCOUNTER — Ambulatory Visit: Payer: No Typology Code available for payment source | Attending: Family Medicine | Admitting: Pharmacist

## 2015-08-09 VITALS — BP 136/87 | HR 73

## 2015-08-09 DIAGNOSIS — I10 Essential (primary) hypertension: Secondary | ICD-10-CM | POA: Insufficient documentation

## 2015-08-09 DIAGNOSIS — Z79899 Other long term (current) drug therapy: Secondary | ICD-10-CM | POA: Insufficient documentation

## 2015-08-09 NOTE — Progress Notes (Signed)
S:    Patient arrives to clinic in good spirits. Presents to the clinic for hypertension evaluation.   Patient reports adherence with medications.  Current BP Medications include:  Hydrochlorothiazide 25 mg daily  Patient is interested in switching her blood pressure medication to decrease urination throughout the day.   Patient reports that she had a lighter menstrual cycle recently and this was concerning to her as she normally has heavier cycles. Patient denies pregnancy - tubal ligation years ago per her report.  She also reports that she has chest pain. She denies radiating pain, difficulty breathing, sweating, or any other emergency signs. She reports that she picked up a very heavy tea cooler at work and it has hurt since then. She also reports that it may be due to stress as she really misses her mother, who passed away in 05/18/2015.   Patient also reports a headache on Monday that has gotten better. Denies any warning signs of a stroke.   O:   Last 3 Office BP readings: BP Readings from Last 3 Encounters:  08/09/15 136/87  06/28/15 145/96  06/12/15 134/83    BMET    Component Value Date/Time   NA 142 06/12/2015 1801   K 4.3 06/12/2015 1801   CL 103 06/12/2015 1801   CO2 25 06/12/2015 1801   GLUCOSE 81 06/12/2015 1801   BUN 16 06/12/2015 1801   CREATININE 0.55 06/12/2015 1801   CREATININE 0.49* 01/04/2015 0255   CALCIUM 9.6 06/12/2015 1801   GFRNONAA >90 01/04/2015 0255   GFRAA >90 01/04/2015 0255    A/P: History of hypertension currently controlled on current medications.  Continue hydrochlorothiazide 25 mg daily and she will follow up with Dr. Armen Pickup for other issues and a change in therapy can be addressed then. Patient chest pain sounds to be musculoskeletal in nature but will have her follow up with Dr. Armen Pickup. There were no warning signs of MI. Written warning signs of both heart attack and stroke were provided to the patient. Patient verbalized understanding  of the warning signs and was able to state them. Patient to also follow up with concerns over menstrual cycle.  Results reviewed and written information provided.   Total time in face-to-face counseling 20 minutes.   F/U Clinic Visit with Dr. Armen Pickup.

## 2015-08-09 NOTE — Patient Instructions (Addendum)
Thank you for coming in to see me today!  Continue taking hydrochlorothiazide 25 mg daily.   Go to the emergency room or call 911 if you have any of the warning symptoms of a stroke or heart attack  Schedule an appointment with Dr. Adrian Blackwater  Heart Attack A heart attack (myocardial infarction, MI) causes damage to the heart that cannot be fixed. A heart attack often happens when a blood clot or other blockage cuts blood flow to the heart. When this happens, certain areas of the heart begin to die. This causes the pain you feel during a heart attack. HOME CARE  Take medicine as told by your doctor. You may need medicine to:  Keep your blood from clotting too easily.  Control your blood pressure.  Lower your cholesterol.  Control abnormal heart rhythms.  Change certain behaviors as told by your doctor. This may include:  Quitting smoking.  Being active.  Eating a heart-healthy diet. Ask your doctor for help with this diet.  Keeping a healthy weight.  Keeping your diabetes under control.  Lessening stress.  Limiting how much alcohol you drink. Do not take these medicines unless your doctor says that you can:  Nonsteroidal anti-inflammatory drugs (NSAIDs). These include:  Ibuprofen.  Naproxen.  Celecoxib.  Vitamin supplements that have vitamin A, vitamin E, or both.  Hormone therapy that contains estrogen with or without progestin. GET HELP RIGHT AWAY IF:  You have sudden chest discomfort.  You have sudden discomfort in your:  Arms.  Back.  Neck.  Jaw.  You have shortness of breath at any time.  You have sudden sweating or clammy skin.  You feel sick to your stomach (nauseous) or throw up (vomit).  You suddenly get light-headed or dizzy.  You feel your heart beating fast or skipping beats. These symptoms may be an emergency. Do not wait to see if the symptoms will go away. Get medical help right away. Call your local emergency services (911 in the  [place] Do not drive yourself to the hospital.   This information is not intended to replace advice given to you by your health care provider. Make sure you discuss any questions you have with your health care provider.   Document Released: 04/21/2012 Document Revised: 03/07/2015 Document Reviewed: 12/24/2013 Elsevier Interactive Patient Education 2016 [employer].  Stroke Prevention Some health problems and behaviors may make it more likely for you to have a stroke. Below are ways to lessen your risk of having a stroke.   Be active for at least 30 minutes on most or all days.  Do not smoke. Try not to be around others who smoke.  Do not drink too much alcohol.  Do not have more than 2 drinks a day if you are a man.  Do not have more than 1 drink a day if you are a woman and are not pregnant.  Eat healthy foods, such as fruits and vegetables. If you were put on a specific diet, follow the diet as told.  Keep your cholesterol levels under control through diet and medicines. Look for foods that are low in saturated fat, trans fat, cholesterol, and are high in fiber.  If you have diabetes, follow all diet plans and take your medicine as told.  Ask your doctor if you need treatment to lower your blood pressure. If you have high blood pressure (hypertension), follow all diet plans and take your medicine as told by your doctor.  If you are 18-39 years  old, have your blood pressure checked every 3-5 years. If you are age 71 or older, have your blood pressure checked every year.  Keep a healthy weight. Eat foods that are low in calories, salt, saturated fat, trans fat, and cholesterol.  Do not take drugs.  Avoid birth control pills, if this applies. Talk to your doctor about the risks of taking birth control pills.  Talk to your doctor if you have sleep problems (sleep apnea).  Take all medicine as told by your doctor.  You may be told to take aspirin or blood thinner medicine. Take  this medicine as told by your doctor.  Understand your medicine instructions.  Make sure any other conditions you have are being taken care of. GET HELP RIGHT AWAY IF:  You suddenly lose feeling (you feel numb) or have weakness in your face, arm, or leg.  Your face or eyelid hangs down to one side.  You suddenly feel confused.  You have trouble talking (aphasia) or understanding what people are saying.  You suddenly have trouble seeing in one or both eyes.  You suddenly have trouble walking.  You are dizzy.  You lose your balance or your movements are clumsy (uncoordinated).  You suddenly have a very bad headache and you do not know the cause.  You have new chest pain.  Your heart feels like it is fluttering or skipping a beat (irregular heartbeat). Do not wait to see if the symptoms above go away. Get help right away. Call your local emergency services (911 in [place] Do not drive yourself to the hospital.   This information is not intended to replace advice given to you by your health care provider. Make sure you discuss any questions you have with your health care provider.   Document Released: 04/21/2012 Document Revised: 11/11/2014 Document Reviewed: 04/23/2013 Elsevier Interactive Patient Education 2016 [place] DASH stands for "Dietary Approaches to Stop Hypertension." The DASH eating plan is a healthy eating plan that has been shown to reduce high blood pressure (hypertension). Additional health benefits may include reducing the risk of type 2 diabetes mellitus, heart disease, and stroke. The DASH eating plan may also help with weight loss. WHAT DO I NEED TO KNOW ABOUT THE DASH EATING PLAN? For the DASH eating plan, you will follow these general guidelines:  Choose foods with a percent daily value for sodium of less than 5% (as listed on the food label).  Use salt-free seasonings or herbs instead of table salt or sea salt.  Check with your  health care provider or pharmacist before using salt substitutes.  Eat lower-sodium products, often labeled as "lower sodium" or "no salt added."  Eat fresh foods.  Eat more vegetables, fruits, and low-fat dairy products.  Choose whole grains. Look for the word "whole" as the first word in the ingredient list.  Choose fish and skinless chicken or [place] more often than red meat. Limit fish, poultry, and meat to 6 oz (170 g) each day.  Limit sweets, desserts, sugars, and sugary drinks.  Choose heart-healthy fats.  Limit cheese to 1 oz (28 g) per day.  Eat more home-cooked food and less restaurant, buffet, and fast food.  Limit fried foods.  Cook foods using methods other than frying.  Limit canned vegetables. If you do use them, rinse them well to decrease the sodium.  When eating at a restaurant, ask that your food be prepared with less salt, or no salt if possible. WHAT  FOODS CAN I EAT? Seek help from a dietitian for individual calorie needs. Grains Whole grain or whole wheat bread. Brown rice. Whole grain or whole wheat pasta. Quinoa, bulgur, and whole grain cereals. Low-sodium cereals. Corn or whole wheat flour tortillas. Whole grain cornbread. Whole grain crackers. Low-sodium crackers. Vegetables Fresh or frozen vegetables (raw, steamed, roasted, or grilled). Low-sodium or reduced-sodium tomato and vegetable juices. Low-sodium or reduced-sodium tomato sauce and paste. Low-sodium or reduced-sodium canned vegetables.  Fruits All fresh, canned (in natural juice), or frozen fruits. Meat and Other Protein Products Ground beef (85% or leaner), grass-fed beef, or beef trimmed of fat. Skinless chicken or [place]. Ground chicken or [place]. Pork trimmed of fat. All fish and seafood. Eggs. Dried beans, peas, or lentils. Unsalted nuts and seeds. Unsalted canned beans. Dairy Low-fat dairy products, such as skim or 1% milk, 2% or reduced-fat cheeses, low-fat ricotta or cottage cheese, or  plain low-fat yogurt. Low-sodium or reduced-sodium cheeses. Fats and Oils Tub margarines without trans fats. Light or reduced-fat mayonnaise and salad dressings (reduced sodium). Avocado. Safflower, olive, or canola oils. Natural peanut or almond butter. Other Unsalted popcorn and pretzels. The items listed above may not be a complete list of recommended foods or beverages. Contact your dietitian for more options. WHAT FOODS ARE NOT RECOMMENDED? Grains White bread. White pasta. White rice. Refined cornbread. Bagels and croissants. Crackers that contain trans fat. Vegetables Creamed or fried vegetables. Vegetables in a cheese sauce. Regular canned vegetables. Regular canned tomato sauce and paste. Regular tomato and vegetable juices. Fruits Dried fruits. Canned fruit in light or heavy syrup. Fruit juice. Meat and Other Protein Products Fatty cuts of meat. Ribs, chicken wings, bacon, sausage, bologna, salami, chitterlings, fatback, hot dogs, bratwurst, and packaged luncheon meats. Salted nuts and seeds. Canned beans with salt. Dairy Whole or 2% milk, cream, half-and-half, and cream cheese. Whole-fat or sweetened yogurt. Full-fat cheeses or blue cheese. Nondairy creamers and whipped toppings. Processed cheese, cheese spreads, or cheese curds. Condiments Onion and garlic salt, seasoned salt, table salt, and sea salt. Canned and packaged gravies. Worcestershire sauce. Tartar sauce. Barbecue sauce. Teriyaki sauce. Soy sauce, including reduced sodium. Steak sauce. Fish sauce. Oyster sauce. Cocktail sauce. Horseradish. Ketchup and mustard. Meat flavorings and tenderizers. Bouillon cubes. Hot sauce. Tabasco sauce. Marinades. Taco seasonings. Relishes. Fats and Oils Butter, stick margarine, lard, shortening, ghee, and bacon fat. Coconut, palm kernel, or palm oils. Regular salad dressings. Other Pickles and olives. Salted popcorn and pretzels. The items listed above may not be a complete list of foods  and beverages to avoid. Contact your dietitian for more information. WHERE CAN I FIND MORE INFORMATION? National Heart, Lung, and Blood Institute: travelstabloid.com   This information is not intended to replace advice given to you by your health care provider. Make sure you discuss any questions you have with your health care provider.   Document Released: 10/10/2011 Document Revised: 11/11/2014 Document Reviewed: 08/25/2013 Elsevier Interactive Patient Education [employer].

## 2015-08-16 ENCOUNTER — Emergency Department (HOSPITAL_COMMUNITY): Payer: No Typology Code available for payment source

## 2015-08-16 ENCOUNTER — Emergency Department (HOSPITAL_COMMUNITY)
Admission: EM | Admit: 2015-08-16 | Discharge: 2015-08-16 | Disposition: A | Discharge status: Home / Self Care | Payer: Self-pay | Attending: Emergency Medicine | Admitting: Emergency Medicine

## 2015-08-16 ENCOUNTER — Encounter (HOSPITAL_COMMUNITY): Payer: Self-pay | Admitting: Emergency Medicine

## 2015-08-16 DIAGNOSIS — R0789 Other chest pain: Secondary | ICD-10-CM | POA: Insufficient documentation

## 2015-08-16 DIAGNOSIS — I10 Essential (primary) hypertension: Secondary | ICD-10-CM | POA: Insufficient documentation

## 2015-08-16 DIAGNOSIS — Z8719 Personal history of other diseases of the digestive system: Secondary | ICD-10-CM | POA: Insufficient documentation

## 2015-08-16 DIAGNOSIS — Z79899 Other long term (current) drug therapy: Secondary | ICD-10-CM | POA: Insufficient documentation

## 2015-08-16 DIAGNOSIS — Z7952 Long term (current) use of systemic steroids: Secondary | ICD-10-CM | POA: Insufficient documentation

## 2015-08-16 DIAGNOSIS — D649 Anemia, unspecified: Secondary | ICD-10-CM | POA: Insufficient documentation

## 2015-08-16 LAB — I-STAT CHEM 8, ED
BUN: 14 mg/dL (ref 6–20)
Calcium, Ion: 1.16 mmol/L (ref 1.12–1.23)
Chloride: 102 mmol/L (ref 101–111)
Creatinine, Ser: 0.7 mg/dL (ref 0.44–1.00)
Glucose, Bld: 98 mg/dL (ref 65–99)
HCT: 42 % (ref 36.0–46.0)
Hemoglobin: 14.3 g/dL (ref 12.0–15.0)
Potassium: 3.7 mmol/L (ref 3.5–5.1)
Sodium: 141 mmol/L (ref 135–145)
TCO2: 25 mmol/L (ref 0–100)

## 2015-08-16 LAB — I-STAT TROPONIN, ED: Troponin i, poc: 0 ng/mL (ref 0.00–0.08)

## 2015-08-16 IMAGING — CR DG CHEST 2V
2 series · 2 of 2 positions shown · non-contrast
Comparison: [DATE]

CLINICAL DATA: Chest pain radiating from centered to left chest and
left arm. Symptoms for 1 week.

EXAM:
CHEST  2 VIEW

[w chest pa]
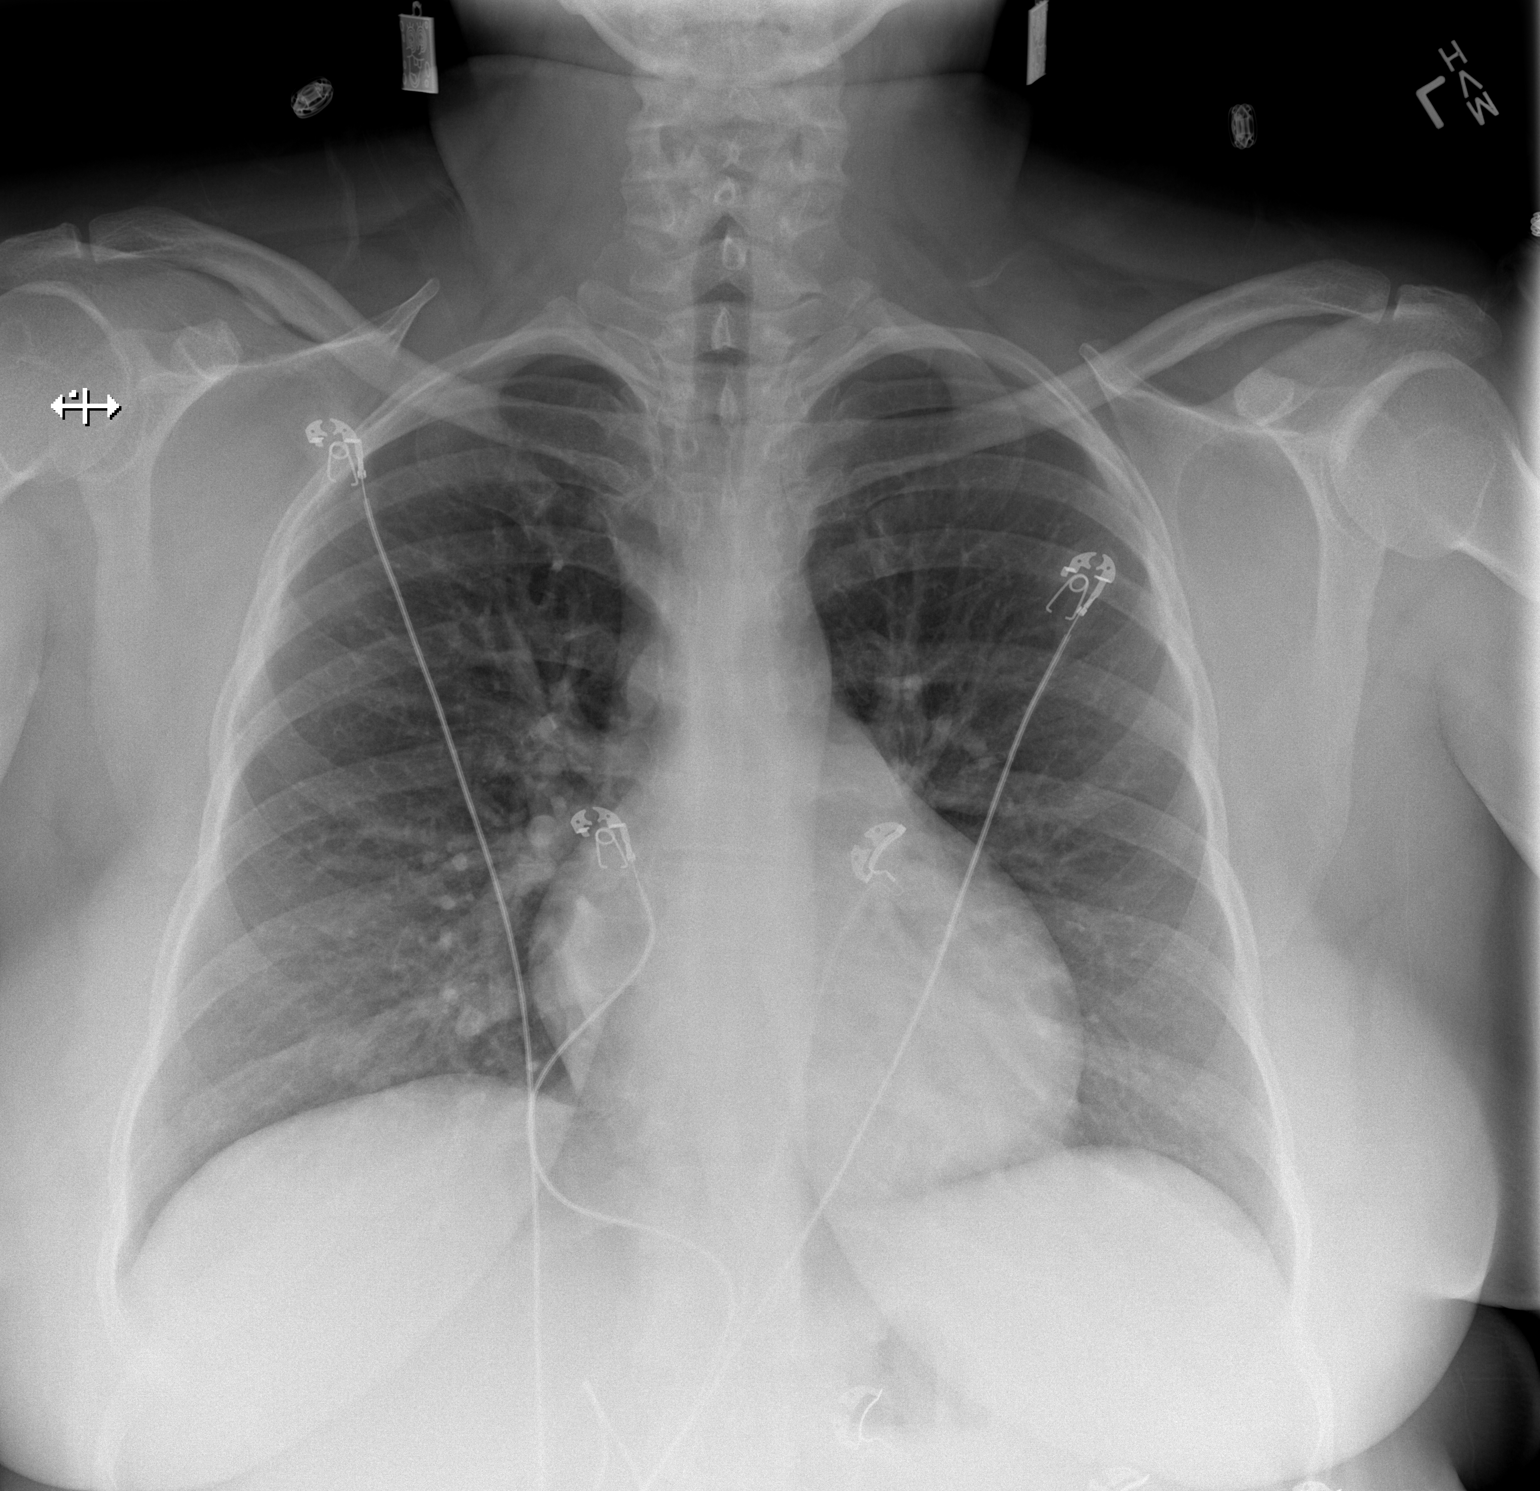

[w chest lat]
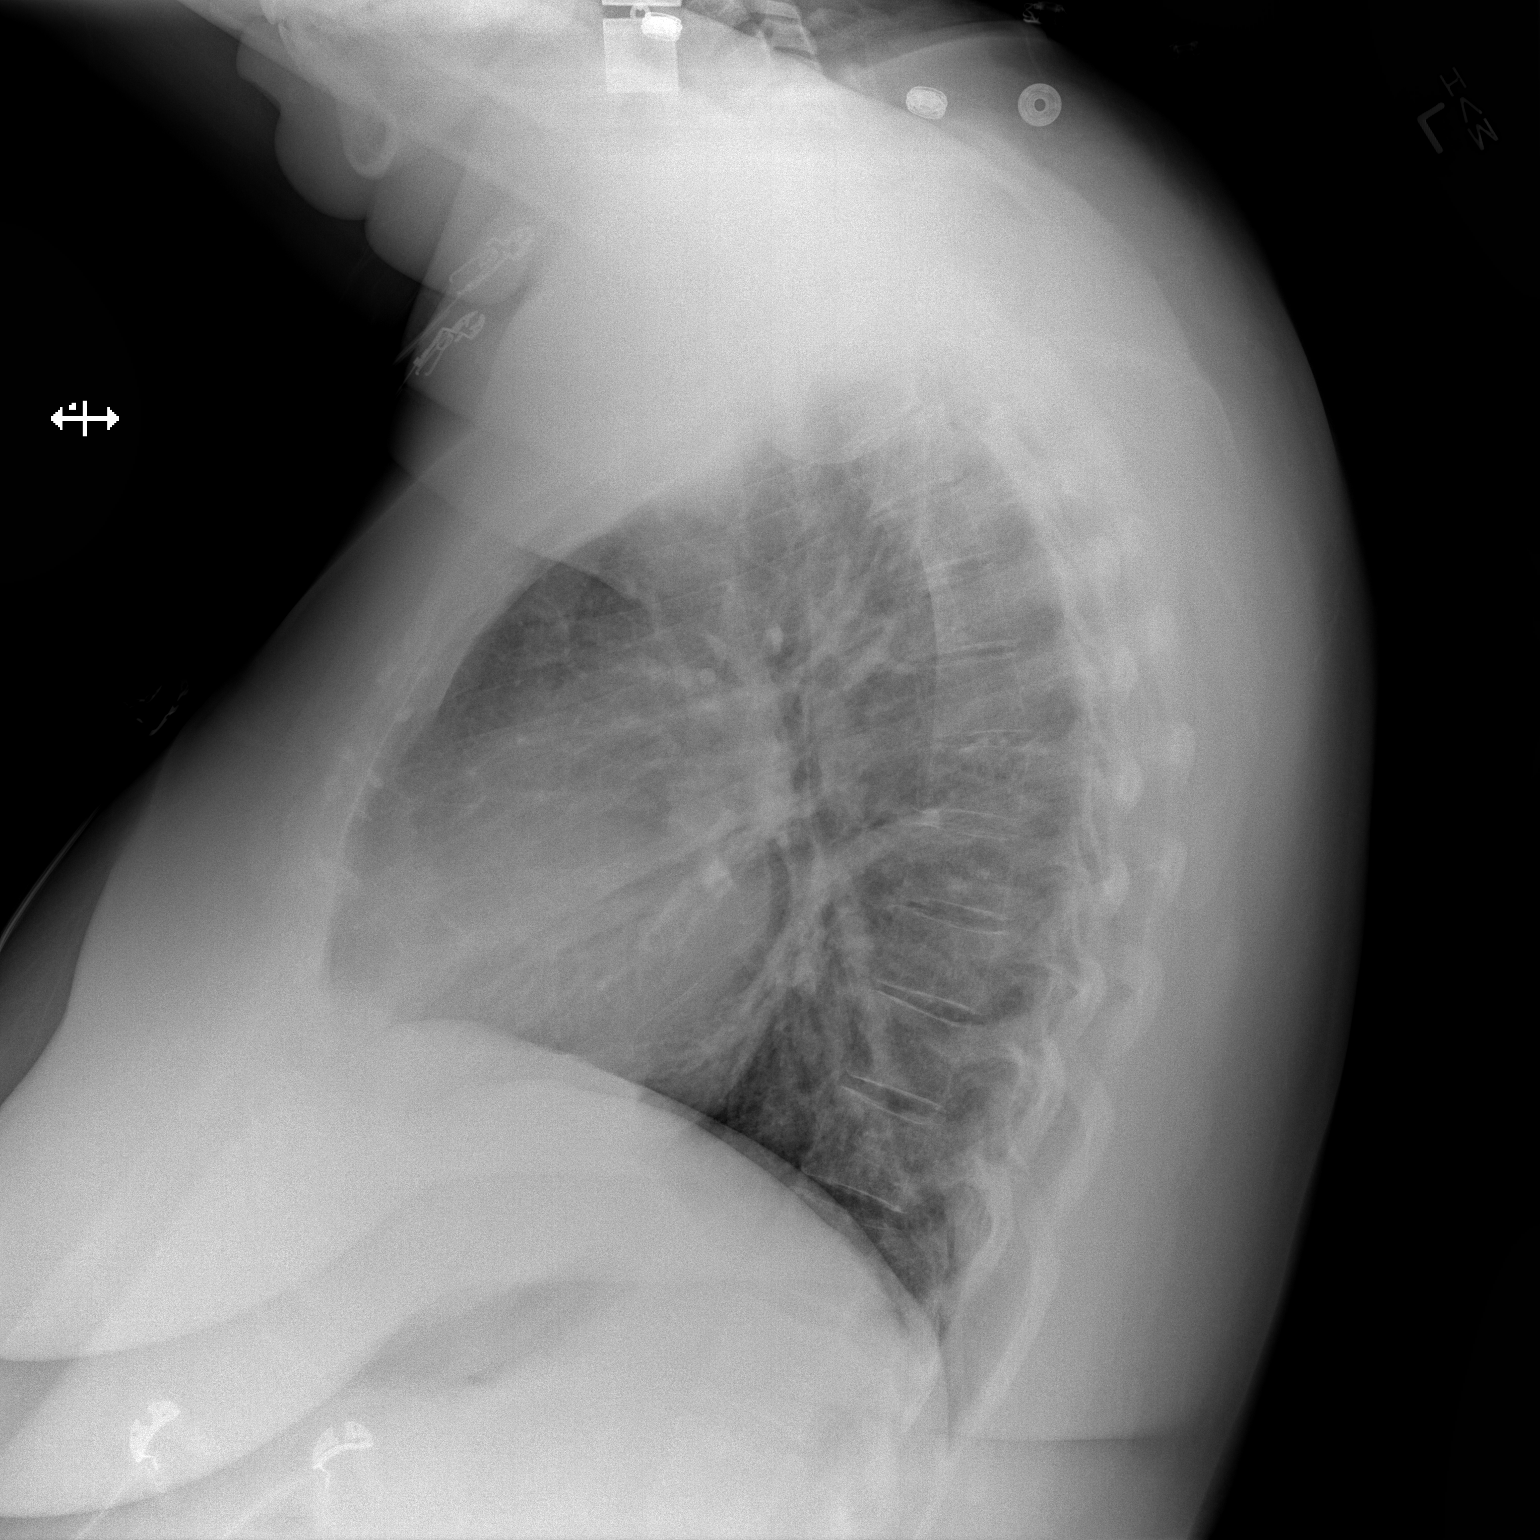

[2 of 2 positions shown; findings below may reference images not displayed]

FINDINGS: The heart size and mediastinal contours are within normal limits.
Both lungs are clear. The visualized skeletal structures are
unremarkable.
IMPRESSION: No active cardiopulmonary disease.

## 2015-08-16 NOTE — Discharge Instructions (Signed)
Nonspecific Chest Pain  Keep your scheduled appointment with  [facility] tomorrow Chest pain can be caused by many different conditions. There is always a chance that your pain could be related to something serious, such as a heart attack or a blood clot in your lungs. Chest pain can also be caused by conditions that are not life-threatening. If you have chest pain, it is very important to follow up with your health care provider. CAUSES  Chest pain can be caused by:  Heartburn.  Pneumonia or bronchitis.  Anxiety or stress.  Inflammation around your heart (pericarditis) or lung (pleuritis or pleurisy).  A blood clot in your lung.  A collapsed lung (pneumothorax). It can develop suddenly on its own (spontaneous pneumothorax) or from trauma to the chest.  Shingles infection (varicella-zoster virus).  Heart attack.  Damage to the bones, muscles, and cartilage that make up your chest wall. This can include:  Bruised bones due to injury.  Strained muscles or cartilage due to frequent or repeated coughing or overwork.  Fracture to one or more ribs.  Sore cartilage due to inflammation (costochondritis). RISK FACTORS  Risk factors for chest pain may include:  Activities that increase your risk for trauma or injury to your chest.  Respiratory infections or conditions that cause frequent coughing.  Medical conditions or overeating that can cause heartburn.  Heart disease or family history of heart disease.  Conditions or health behaviors that increase your risk of developing a blood clot.  Having had chicken pox (varicella zoster). SIGNS AND SYMPTOMS Chest pain can feel like:  Burning or tingling on the surface of your chest or deep in your chest.  Crushing, pressure, aching, or squeezing pain.  Dull or sharp pain that is worse when you move, cough, or take a deep breath.  Pain that is also felt in your back, neck, shoulder, or arm, or pain  that spreads to any of these areas. Your chest pain may come and go, or it may stay constant. DIAGNOSIS Lab tests or other studies may be needed to find the cause of your pain. Your health care provider may have you take a test called an ambulatory ECG (electrocardiogram). An ECG records your heartbeat patterns at the time the test is performed. You may also have other tests, such as:  Transthoracic echocardiogram (TTE). During echocardiography, sound waves are used to create a picture of all of the heart structures and to look at how blood flows through your heart.  Transesophageal echocardiogram (TEE).This is a more advanced imaging test that obtains images from inside your body. It allows your health care provider to see your heart in finer detail.  Cardiac monitoring. This allows your health care provider to monitor your heart rate and rhythm in real time.  Holter monitor. This is a portable device that records your heartbeat and can help to diagnose abnormal heartbeats. It allows your health care provider to track your heart activity for several days, if needed.  Stress tests. These can be done through exercise or by taking medicine that makes your heart beat more quickly.  Blood tests.  Imaging tests. TREATMENT  Your treatment depends on what is causing your chest pain. Treatment may include:  Medicines. These may include:  Acid blockers for heartburn.  Anti-inflammatory medicine.  Pain medicine for inflammatory conditions.  Antibiotic medicine, if an infection is present.  Medicines to dissolve blood clots.  Medicines to treat coronary artery disease.  Supportive care for conditions that  do not require medicines. This may include:  Resting.  Applying heat or cold packs to injured areas.  Limiting activities until pain decreases. HOME CARE INSTRUCTIONS  If you were prescribed an antibiotic medicine, finish it all even if you start to feel better.  Avoid any  activities that bring on chest pain.  Do not use any tobacco products, including cigarettes, chewing tobacco, or electronic cigarettes. If you need help quitting, ask your health care provider.  Do not drink alcohol.  Take medicines only as directed by your health care provider.  Keep all follow-up visits as directed by your health care provider. This is important. This includes any further testing if your chest pain does not go away.  If heartburn is the cause for your chest pain, you may be told to keep your head raised (elevated) while sleeping. This reduces the chance that acid will go from your stomach into your esophagus.  Make lifestyle changes as directed by your health care provider. These may include:  Getting regular exercise. Ask your health care provider to suggest some activities that are safe for you.  Eating a heart-healthy diet. A registered dietitian can help you to learn healthy eating options.  Maintaining a healthy weight.  Managing diabetes, if necessary.  Reducing stress. SEEK MEDICAL CARE IF:  Your chest pain does not go away after treatment.  You have a rash with blisters on your chest.  You have a fever. SEEK IMMEDIATE MEDICAL CARE IF:   Your chest pain is worse.  You have an increasing cough, or you cough up blood.  You have severe abdominal pain.  You have severe weakness.  You faint.  You have chills.  You have sudden, unexplained chest discomfort.  You have sudden, unexplained discomfort in your arms, back, neck, or jaw.  You have shortness of breath at any time.  You suddenly start to sweat, or your skin gets clammy.  You feel nauseous or you vomit.  You suddenly feel light-headed or dizzy.  Your heart begins to beat quickly, or it feels like it is skipping beats. These symptoms may represent a serious problem that is an emergency. Do not wait to see if the symptoms will go away. Get medical help right away. Call your local  emergency services (911 in the [place] Do not drive yourself to the hospital.   This information is not intended to replace advice given to you by your health care provider. Make sure you discuss any questions you have with your health care provider.   Document Released: 07/31/2005 Document Revised: 11/11/2014 Document Reviewed: 05/27/2014 Elsevier Interactive Patient Education [employer] Elsevier Inc.

## 2015-08-16 NOTE — ED Notes (Signed)
Pt c/o chest pain that radiates from center to left upper arm area x week.  Pt states that the pain is intermittent and has an apt with her doctor tomorrow but felt worse today, so decided to come in.  Pt states that she has slight shob, weakness.  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 Pt taken ibuprofen 800mg  but states works 50% of the time.

## 2015-08-16 NOTE — ED Provider Notes (Addendum)
CSN: [id number]     Arrival date & time 08/16/15  1647 History   First MD Initiated Contact with Patient 08/16/15 1717     Chief Complaint  Patient presents with  . Chest Pain     (Consider location/radiation/quality/duration/timing/severity/associated sxs/prior Treatment) HPI Complains of anterior chest pain radiates from right chest to left chest and back and forth waxes and wanes for the past 2 weeks, constant, worse with exertion and improved with treatment with ibuprofen. She's had similar pain in the past, been told it was "inflammation of my chest wall". No other associated symptoms.. Presently discomfort is 2 on a scale 1-10. No other associated symptoms. No cough no hemoptysis Past Medical History  Diagnosis Date  . Reflux   . Elevated liver enzymes   . Anemia Dx January 03 2015  . Hypertension Dx Apr 2016   Past Surgical History  Procedure Laterality Date  . Tubal ligation  Jul 25, 2000   Family History  Problem Relation Age of Onset  . COPD Mother   . Esophageal cancer Mother   . Diabetes Mother   . Cancer Mother   . Hypertension Mother   . Diabetes Father    Social History  Substance Use Topics  . Smoking status: Never Smoker   . Smokeless tobacco: None  . Alcohol Use: No   OB History    No data available     Review of Systems  Constitutional: Negative.   HENT: Negative.   Respiratory: Positive for shortness of breath.   Cardiovascular: Positive for chest pain.  Gastrointestinal: Negative.   Musculoskeletal: Negative.   Skin: Negative.   Neurological: Negative.   Psychiatric/Behavioral: Negative.   All other systems reviewed and are negative.     Allergies  Review of patient's allergies indicates no known allergies.  Home Medications   Prior to Admission medications   Medication Sig Start Date End Date Taking? Authorizing Provider  ferrous sulfate 325 (65 FE) MG tablet Take 325 mg by mouth 2 (two) times daily with a meal.    Historical  Provider, MD  fluticasone (FLONASE) 50 MCG/ACT nasal spray Place 2 sprays into both nostrils daily. 03/01/15   Josalyn Funches, MD  hydrochlorothiazide (HYDRODIURIL) 25 MG tablet Take 1 tablet (25 mg total) by mouth daily. 05/03/15   Josalyn Funches, MD   BP 129/91 mmHg  Pulse 80  Temp(Src) 98.5 F (36.9 C) (Oral)  Resp 20  SpO2 98% Physical Exam  Constitutional: She appears well-developed and well-nourished.  HENT:  Head: Normocephalic and atraumatic.  Eyes: Conjunctivae are normal. Pupils are equal, round, and reactive to light.  Neck: Neck supple. No tracheal deviation present. No thyromegaly present.  Cardiovascular: Normal rate and regular rhythm.   No murmur heard. Pulmonary/Chest: Effort normal and breath sounds normal. She exhibits tenderness.  Chest pain is easily reproduced by forcible abduction of left shoulder  Abdominal: Soft. Bowel sounds are normal. She exhibits no distension. There is no tenderness.  Musculoskeletal: Normal range of motion. She exhibits no edema or tenderness.  Neurological: She is alert. Coordination normal.  Skin: Skin is warm and dry. No rash noted.  Psychiatric: She has a normal mood and affect.  Nursing note and vitals reviewed.   ED Course  Procedures (including critical care time) Labs Review Labs Reviewed  BASIC METABOLIC PANEL  CBC    Imaging Review No results found. I have personally reviewed and evaluated these images and lab results as part of my medical decision-making.   EKG  Interpretation   Date/Time:  Wednesday August 16 2015 16:52:27 EDT Ventricular Rate:  82 PR Interval:  143 QRS Duration: 77 QT Interval:  348 QTC Calculation: 406 R Axis:   76 Text Interpretation:  Sinus rhythm Nonspecific T abnrm, anterolateral  leads No significant change since last tracing Confirmed by Tanajah Boulter   MD, Finas Delone [phone]([id number]) on 08/16/2015 4:58:25 PM     6:10 PM patient states pain is 1 on a scale of 1-10. She declines any pain  medicine. Chest x-ray viewed by me Results for orders placed or performed during the hospital encounter of 08/16/15  I-Stat Chem 8, ED  Result Value Ref Range   Sodium 141 135 - 145 mmol/L   Potassium 3.7 3.5 - 5.1 mmol/L   Chloride 102 101 - 111 mmol/L   BUN 14 6 - 20 mg/dL   Creatinine, Ser 6.040.70 0.44 - 1.00 mg/dL   Glucose, Bld 98 65 - 99 mg/dL   Calcium, Ion 5.401.16 9.811.12 - 1.23 mmol/L   TCO2 25 0 - 100 mmol/L   Hemoglobin 14.3 12.0 - 15.0 g/dL   HCT 19.142.0 47.836.0 - 29.546.0 %  I-stat troponin, ED  Result Value Ref Range   Troponin i, poc 0.00 0.00 - 0.08 ng/mL   Comment 3           Dg Chest 2 View  08/16/2015  CLINICAL DATA:  Chest pain radiating from centered to left chest and left arm. Symptoms for 1 week. EXAM: CHEST  2 VIEW COMPARISON:  01/03/2015 FINDINGS: The heart size and mediastinal contours are within normal limits. Both lungs are clear. The visualized skeletal structures are unremarkable. IMPRESSION: No active cardiopulmonary disease. Electronically Signed   By: Charlett NoseKevin  Dover M.D.   On: 08/16/2015 17:52    MDM  risk  cardiac factors hypertension, otherwise negative. Heart score equals 2 based on EKG criteria, risk factors. Pretest clinical suspicion for pulmonary embolism is low. PERC negative. Exam is most consistent with musculoskeletal chest pain Plan [facility]  Diagnosis atypical chest pain  Final diagnoses:  None        Doug SouSam Nithya Meriweather, MD 08/16/15 1813  Doug SouSam Sharna Gabrys, MD 08/17/15 [id number]

## 2015-08-16 NOTE — ED Notes (Signed)
Patient was alert, oriented and stable upon discharge. RN went over AVS and patient had no further questions.  

## 2015-08-17 ENCOUNTER — Encounter: Payer: Self-pay | Admitting: Family Medicine

## 2015-08-17 ENCOUNTER — Ambulatory Visit: Payer: No Typology Code available for payment source | Attending: Family Medicine | Admitting: Family Medicine

## 2015-08-17 VITALS — BP 133/91 | HR 74 | Temp 98.8°F | Resp 16 | Ht 63.0 in | Wt 226.0 lb

## 2015-08-17 DIAGNOSIS — R350 Frequency of micturition: Secondary | ICD-10-CM | POA: Insufficient documentation

## 2015-08-17 DIAGNOSIS — Z79899 Other long term (current) drug therapy: Secondary | ICD-10-CM | POA: Insufficient documentation

## 2015-08-17 DIAGNOSIS — I10 Essential (primary) hypertension: Secondary | ICD-10-CM | POA: Insufficient documentation

## 2015-08-17 DIAGNOSIS — R0789 Other chest pain: Secondary | ICD-10-CM | POA: Insufficient documentation

## 2015-08-17 MED ORDER — LOSARTAN POTASSIUM 50 MG PO TABS: 50.0000 mg | ORAL_TABLET | Freq: Every day | ORAL | Status: DC

## 2015-08-17 MED ORDER — MELOXICAM 15 MG PO TABS: 15.0000 mg | ORAL_TABLET | Freq: Every day | ORAL | Status: DC

## 2015-08-17 NOTE — Assessment & Plan Note (Signed)
Suspect costochondritis mobic

## 2015-08-17 NOTE — Progress Notes (Signed)
Patient ID: Angela FothergillMichelle D Castillo, female   DOB: 03/18/1976, 39 y.o.   MRN: [id number]   Subjective:  Patient ID: Angela FothergillMichelle D Castillo, female    DOB: 03/18/1976  Age: 39 y.o. MRN: [id number]  CC: Hospitalization Follow-up   HPI Angela Castillo presents for   1. ED f/u chest pain. Went to ED yesterday for anterior chest pain R and L side of chest. X 2 weeks. Radiated left to anterior shoulder.    2. Change in periods: September cycle last for one day. Very light. Due for October cycle by next week. Usual flow is 5 days. Starts very light, heavy for 2-3 days, taper, one more day then done.   Social History  Substance Use Topics  . Smoking status: Never Smoker   . Smokeless tobacco: Not on file  . Alcohol Use: No    Outpatient Prescriptions Prior to Visit  Medication Sig Dispense Refill  . ferrous sulfate 325 (65 FE) MG tablet Take 325 mg by mouth 2 (two) times daily with a meal.    . fluticasone (FLONASE) 50 MCG/ACT nasal spray Place 2 sprays into both nostrils daily. (Patient taking differently: Place 2 sprays into both nostrils daily as needed for allergies or rhinitis. ) 16 g 6  . hydrochlorothiazide (HYDRODIURIL) 25 MG tablet Take 1 tablet (25 mg total) by mouth daily. 30 tablet 11  . ibuprofen (ADVIL,MOTRIN) 200 MG tablet Take 400-1,000 mg by mouth every 6 (six) hours as needed for moderate pain.    . Multiple Vitamins-Minerals (HAIR SKIN AND NAILS FORMULA PO) Take 2 each by mouth every morning. gummies     No facility-administered medications prior to visit.    ROS Review of Systems  Constitutional: Negative for fever and chills.  Eyes: Negative for visual disturbance.  Respiratory: Negative for shortness of breath.   Cardiovascular: Positive for chest pain.  Gastrointestinal: Negative for abdominal pain and blood in stool.  Genitourinary: Positive for frequency and menstrual problem.  Musculoskeletal: Negative for back pain and arthralgias.  Skin: Negative for rash.   Allergic/Immunologic: Negative for immunocompromised state.  Neurological: Positive for headaches.  Hematological: Negative for adenopathy. Does not bruise/bleed easily.  Psychiatric/Behavioral: Negative for suicidal ideas and dysphoric mood.  GAD-7: score of 2. 1-1,4,6.   Objective:  BP 133/91 mmHg  Pulse 74  Temp(Src) 98.8 F (37.1 C) (Oral)  Resp 16  Ht 5\' 3"  (1.6 m)  Wt 226 lb (102.513 kg)  BMI 40.04 kg/m2  SpO2 99%  LMP 07/22/2015  BP/Weight 08/17/2015 08/16/2015 08/09/2015  Systolic BP 133 129 136  Diastolic BP 91 91 87  Wt. (Lbs) 226 - -  BMI 40.04 - -   Physical Exam  Constitutional: She is oriented to person, place, and time. She appears well-developed and well-nourished. No distress.  HENT:  Head: Normocephalic and atraumatic.  Cardiovascular: Normal rate, regular rhythm, normal heart sounds and intact distal pulses.   Pulmonary/Chest: Effort normal and breath sounds normal. She exhibits tenderness.    Musculoskeletal: She exhibits no edema.  Neurological: She is alert and oriented to person, place, and time.  Skin: Skin is warm and dry. No rash noted.  Psychiatric: She has a normal mood and affect.   GI cocktail x one: improved pain slightly   Assessment & Plan:   Problem List Items Addressed This Visit    Atypical chest pain   Relevant Medications   meloxicam (MOBIC) 15 MG tablet   Essential hypertension - Primary (Chronic)   Relevant Medications  losartan (COZAAR) 50 MG tablet      No orders of the defined types were placed in this encounter.    Follow-up: No Follow-up on file.   Dessa Phi MD

## 2015-08-17 NOTE — Patient Instructions (Addendum)
Angela Castillo was seen today for hospitalization follow-up.  Diagnoses and all orders for this visit:  Essential hypertension -     losartan (COZAAR) 50 MG tablet; Take 1 tablet (50 mg total) by mouth daily.  Atypical chest pain -     meloxicam (MOBIC) 15 MG tablet; Take 1 tablet (15 mg total) by mouth daily.    F/u in 4 weeks with pharmacy team for BP check F/u with me in 3 months  Dr. Armen PickupFunches   Costochondritis Costochondritis is a condition in which the tissue (cartilage) that connects your ribs with your breastbone (sternum) becomes irritated. It causes pain in the chest and rib area. It usually goes away on its own over time. HOME CARE  Avoid activities that wear you out.  Do not strain your ribs. Avoid activities that use your:  Chest.  Belly.  Side muscles.  Put ice on the area for the first 2 days after the pain starts.  Put ice in a plastic bag.  Place a towel between your skin and the bag.  Leave the ice on for 20 minutes, 2-3 times a day.  Only take medicine as told by your doctor. GET HELP IF:  You have redness or puffiness (swelling) in the rib area.  Your pain does not go away with rest or medicine. GET HELP RIGHT AWAY IF:   Your pain gets worse.  You are very uncomfortable.  You have trouble breathing.  You cough up blood.  You start sweating or throwing up (vomiting).  You have a fever or lasting symptoms for more than 2-3 days.  You have a fever and your symptoms suddenly get worse. MAKE SURE YOU:   Understand these instructions.  Will watch your condition.  Will get help right away if you are not doing well or get worse.   This information is not intended to replace advice given to you by your health care provider. Make sure you discuss any questions you have with your health care provider.   Document Released: 04/08/2008 Document Revised: 06/23/2013 Document Reviewed: 05/25/2013 Elsevier Interactive Patient Education [employer]2016 [employer]

## 2015-08-17 NOTE — Progress Notes (Signed)
ER F/U chest pain Normal EKG, Xray normal  Irregular menses  Menses last one day  Pain scale #2

## 2015-08-17 NOTE — Assessment & Plan Note (Signed)
A: BP slightly above goal. Patient complaining of urinary frequency  Med: compliant P: stop HCTZ Start losartan

## 2015-08-21 ENCOUNTER — Other Ambulatory Visit: Payer: Self-pay | Admitting: Family Medicine

## 2015-08-21 DIAGNOSIS — H9191 Unspecified hearing loss, right ear: Secondary | ICD-10-CM

## 2015-09-19 ENCOUNTER — Encounter: Payer: No Typology Code available for payment source | Admitting: Pharmacist

## 2015-09-20 ENCOUNTER — Ambulatory Visit: Payer: No Typology Code available for payment source | Attending: Family Medicine | Admitting: Pharmacist

## 2015-09-20 VITALS — BP 132/82 | HR 84

## 2015-09-20 DIAGNOSIS — Z79899 Other long term (current) drug therapy: Secondary | ICD-10-CM | POA: Insufficient documentation

## 2015-09-20 DIAGNOSIS — I10 Essential (primary) hypertension: Secondary | ICD-10-CM | POA: Insufficient documentation

## 2015-09-20 NOTE — Patient Instructions (Signed)
Thanks for coming to see me today!  Make sure to stay hydrated and drink water.  Make appointment with Dr. Adrian Blackwater.

## 2015-09-20 NOTE — Progress Notes (Signed)
S:    Patient arrives in good spirits. Presents to the clinic for hypertension evaluation.   Patient reports adherence with medications.  Current BP Medications include:  Losartan 50 mg daily.   Antihypertensives tried in the past include: HCTZ 25 mg daily (increased urinary frequency)  Dietary habits include: patient does not drink much water throughout the day at work but she will drink water at home. At work, she has sweet tea and coffee - and these must be kept out of sight as she is not supposed to have drinks in the work area.  She reports continued chest pain (no changes) and that the meloxicam did not decrease the pain. She reports that it may be GERD as she feels that she has some acid reflux. She also reports some dizziness when quickly changing positions or if she is standing too long. However, she has had this in the past. She denies SOB, radiating chest pain, headache, sweating, nausea.    O:   Last 3 Office BP readings: BP Readings from Last 3 Encounters:  09/20/15 132/82  08/17/15 133/91  08/16/15 129/91    BMET    Component Value Date/Time   NA 141 08/16/2015 1742   K 3.7 08/16/2015 1742   CL 102 08/16/2015 1742   CO2 25 06/12/2015 1801   GLUCOSE 98 08/16/2015 1742   BUN 14 08/16/2015 1742   CREATININE 0.70 08/16/2015 1742   CREATININE 0.55 06/12/2015 1801   CALCIUM 9.6 06/12/2015 1801   GFRNONAA >90 01/04/2015 0255   GFRAA >90 01/04/2015 0255    A/P: History of hypertension currently controlled on losartan 50 mg daily. I think that it could be possible that her blood pressure is dropping or that she is having orthostatic hypotension. However, she is not drinking enough water at work and I think dehydration could be playing a role. Instructed patient to drink more water throughout the day. Patient to follow up with Dr. Armen PickupFunches for chronic chest pain. No warning signs of stroke or MI. Would consider another iron panel at that time to determine if PO iron has  repleted her iron stores or if we need to adjust treatment.   Results reviewed and written information provided. Total time in face-to-face counseling 20 minutes.   F/U Clinic Visit with Dr. Armen PickupFunches.  Patient seen with Harlow MaresKristen Richardson, nursing student.

## 2015-09-26 ENCOUNTER — Telehealth: Payer: Self-pay

## 2015-09-26 NOTE — Telephone Encounter (Signed)
Patient left voicemail for nurse requesting paperwork to be filled out for shower rail in bathroom. Nurse called patient, patient verified date of birth. Nurse explained paperwork turn around time is 14 days. Patient will drop off paperwork tomorrow for Dr. Armen PickupFunches to fill out.

## 2015-09-27 ENCOUNTER — Telehealth: Payer: Self-pay | Admitting: Family Medicine

## 2015-09-27 NOTE — Telephone Encounter (Signed)
Patient dropped off a form in order to obtain a "grab bar in her shower". Patient was advised that form can take up to 14 business days.

## 2015-10-06 NOTE — Telephone Encounter (Signed)
Patient also called and requested a med refill for meloxicam (MOBIC) 15 MG tablet. Please f/u with pt.

## 2015-10-06 NOTE — Telephone Encounter (Signed)
Please call patient, form completed and ready for pick up

## 2015-10-09 ENCOUNTER — Other Ambulatory Visit: Payer: Self-pay | Admitting: *Deleted

## 2015-10-09 ENCOUNTER — Telehealth: Payer: Self-pay | Admitting: Family Medicine

## 2015-10-09 DIAGNOSIS — R0789 Other chest pain: Secondary | ICD-10-CM

## 2015-10-09 MED ORDER — MELOXICAM 15 MG PO TABS: 15.0000 mg | ORAL_TABLET | Freq: Every day | ORAL | Status: DC

## 2015-10-09 NOTE — Telephone Encounter (Signed)
Patient picked up paperwork from doctor

## 2015-10-09 NOTE — Telephone Encounter (Signed)
Rx refilled, send to [facility]  Form at front office  Pt notified

## 2015-10-19 ENCOUNTER — Ambulatory Visit: Payer: No Typology Code available for payment source | Admitting: Family Medicine

## 2015-10-25 ENCOUNTER — Encounter: Payer: Self-pay | Admitting: Family Medicine

## 2015-10-25 ENCOUNTER — Telehealth: Payer: Self-pay | Admitting: Family Medicine

## 2015-10-25 ENCOUNTER — Ambulatory Visit: Payer: No Typology Code available for payment source | Attending: Family Medicine | Admitting: Family Medicine

## 2015-10-25 ENCOUNTER — Ambulatory Visit (HOSPITAL_COMMUNITY)
Admission: RE | Admit: 2015-10-25 | Discharge: 2015-10-25 | Disposition: A | Discharge status: Home / Self Care | Payer: No Typology Code available for payment source | Source: Ambulatory Visit | Attending: Family Medicine | Admitting: Family Medicine

## 2015-10-25 VITALS — BP 132/80 | HR 77 | Temp 98.5°F | Resp 16 | Ht 63.0 in | Wt 231.0 lb

## 2015-10-25 DIAGNOSIS — R0789 Other chest pain: Secondary | ICD-10-CM

## 2015-10-25 DIAGNOSIS — M79604 Pain in right leg: Secondary | ICD-10-CM | POA: Insufficient documentation

## 2015-10-25 DIAGNOSIS — M25552 Pain in left hip: Secondary | ICD-10-CM | POA: Insufficient documentation

## 2015-10-25 DIAGNOSIS — M2570 Osteophyte, unspecified joint: Secondary | ICD-10-CM | POA: Insufficient documentation

## 2015-10-25 DIAGNOSIS — I10 Essential (primary) hypertension: Secondary | ICD-10-CM

## 2015-10-25 DIAGNOSIS — Z79899 Other long term (current) drug therapy: Secondary | ICD-10-CM | POA: Insufficient documentation

## 2015-10-25 DIAGNOSIS — M1990 Unspecified osteoarthritis, unspecified site: Secondary | ICD-10-CM | POA: Insufficient documentation

## 2015-10-25 DIAGNOSIS — M545 Low back pain: Secondary | ICD-10-CM | POA: Insufficient documentation

## 2015-10-25 DIAGNOSIS — R079 Chest pain, unspecified: Secondary | ICD-10-CM | POA: Insufficient documentation

## 2015-10-25 IMAGING — DX DG LUMBAR SPINE 2-3V
3 series · 3 of 3 positions shown · non-contrast
Comparison: None.

CLINICAL DATA: Back pain with left hip pain

EXAM:
LUMBAR SPINE - 2-3 VIEW

[l-spine ap]
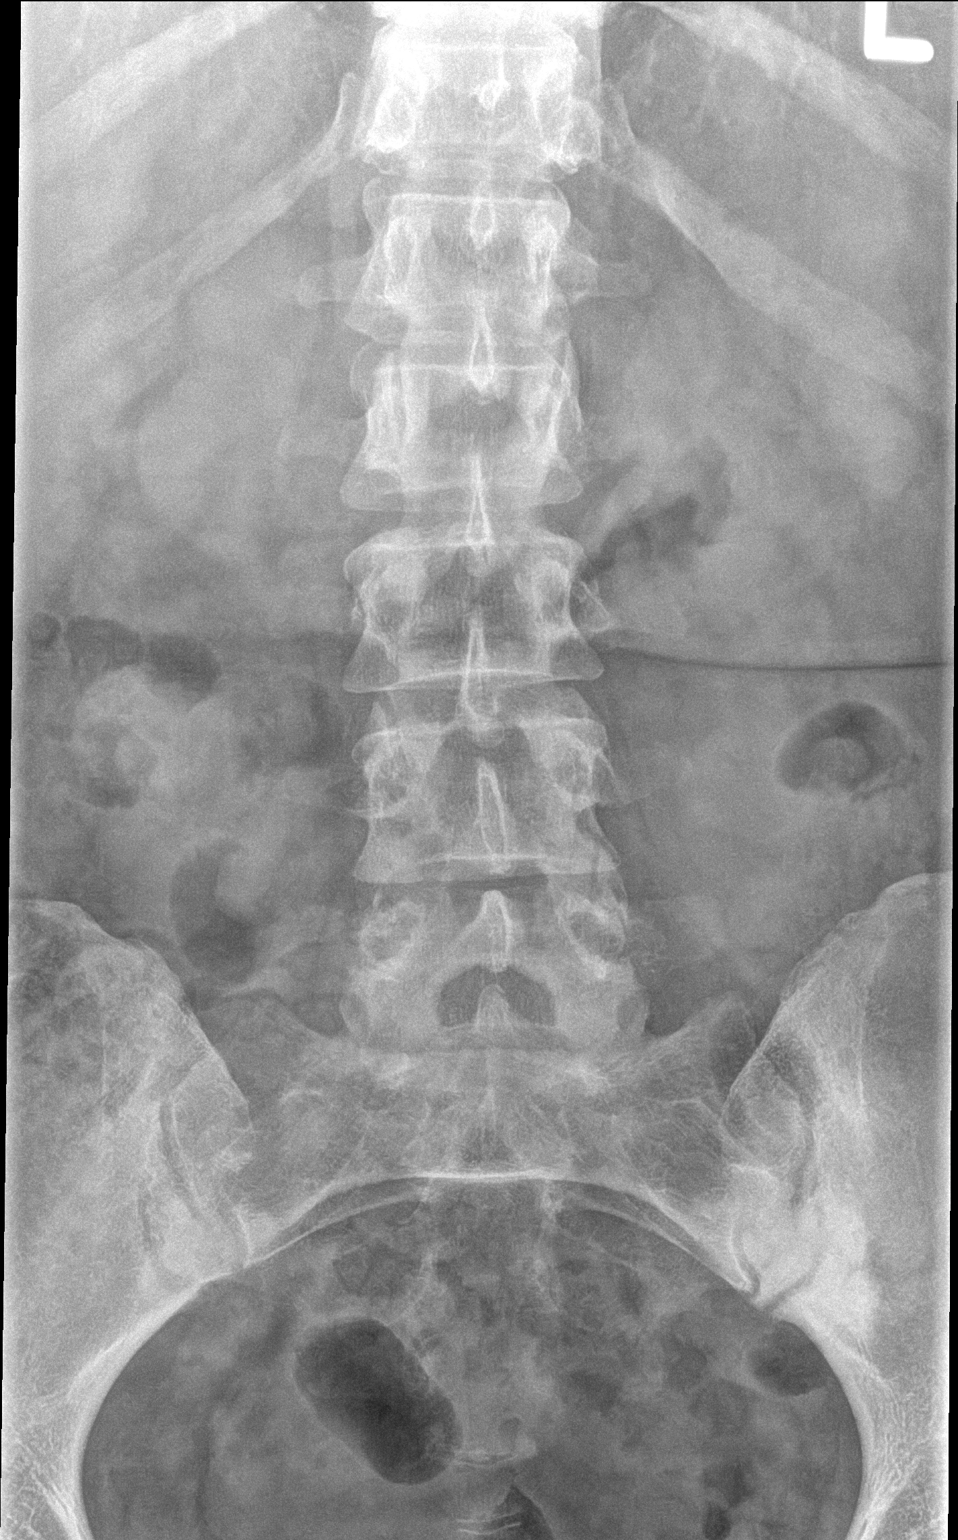

[l-spine lat]
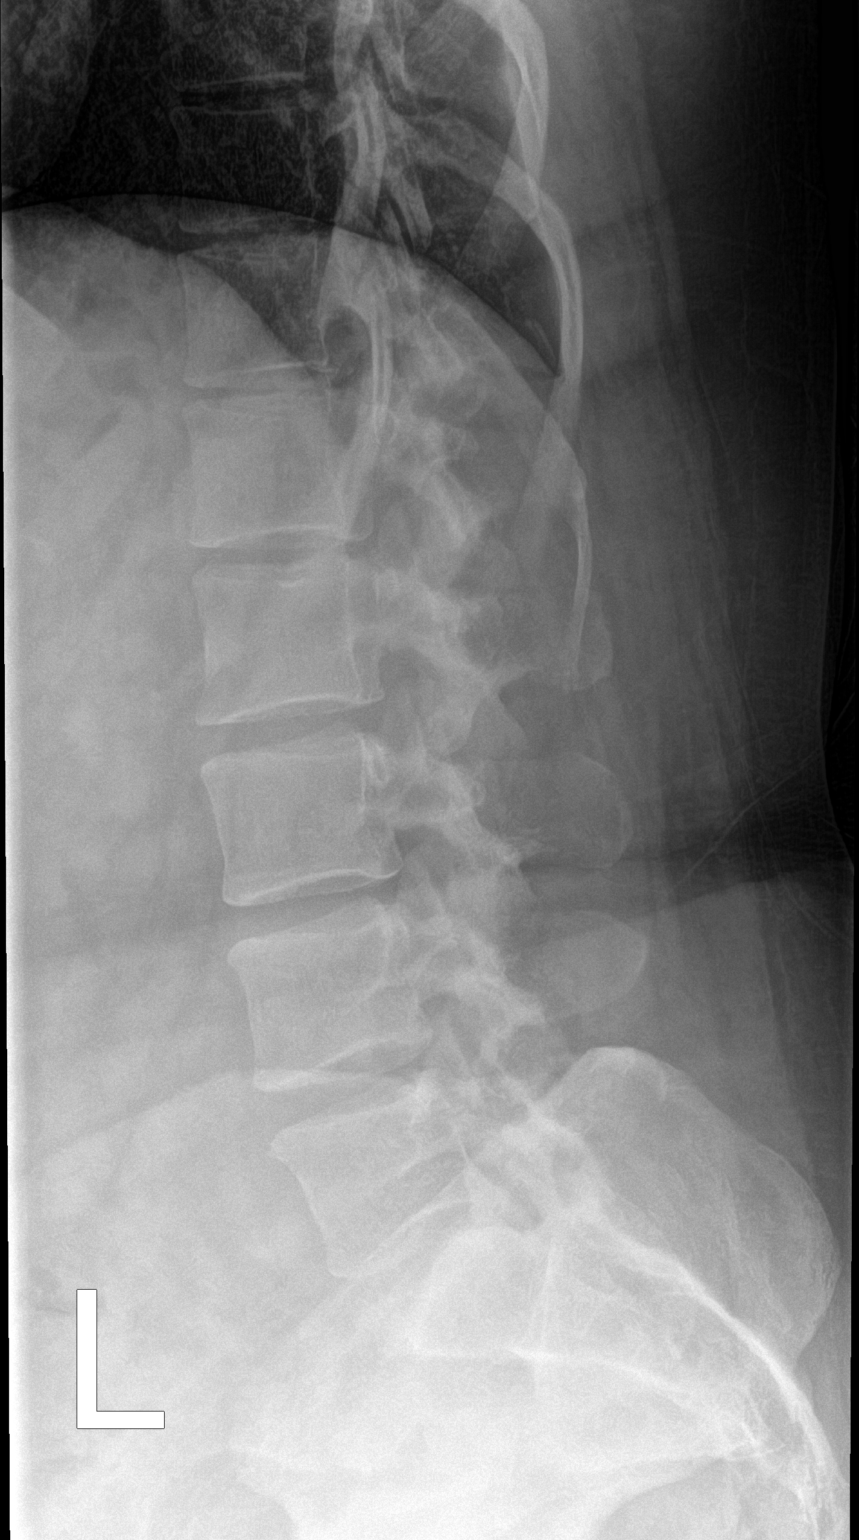

[l-spine spot]
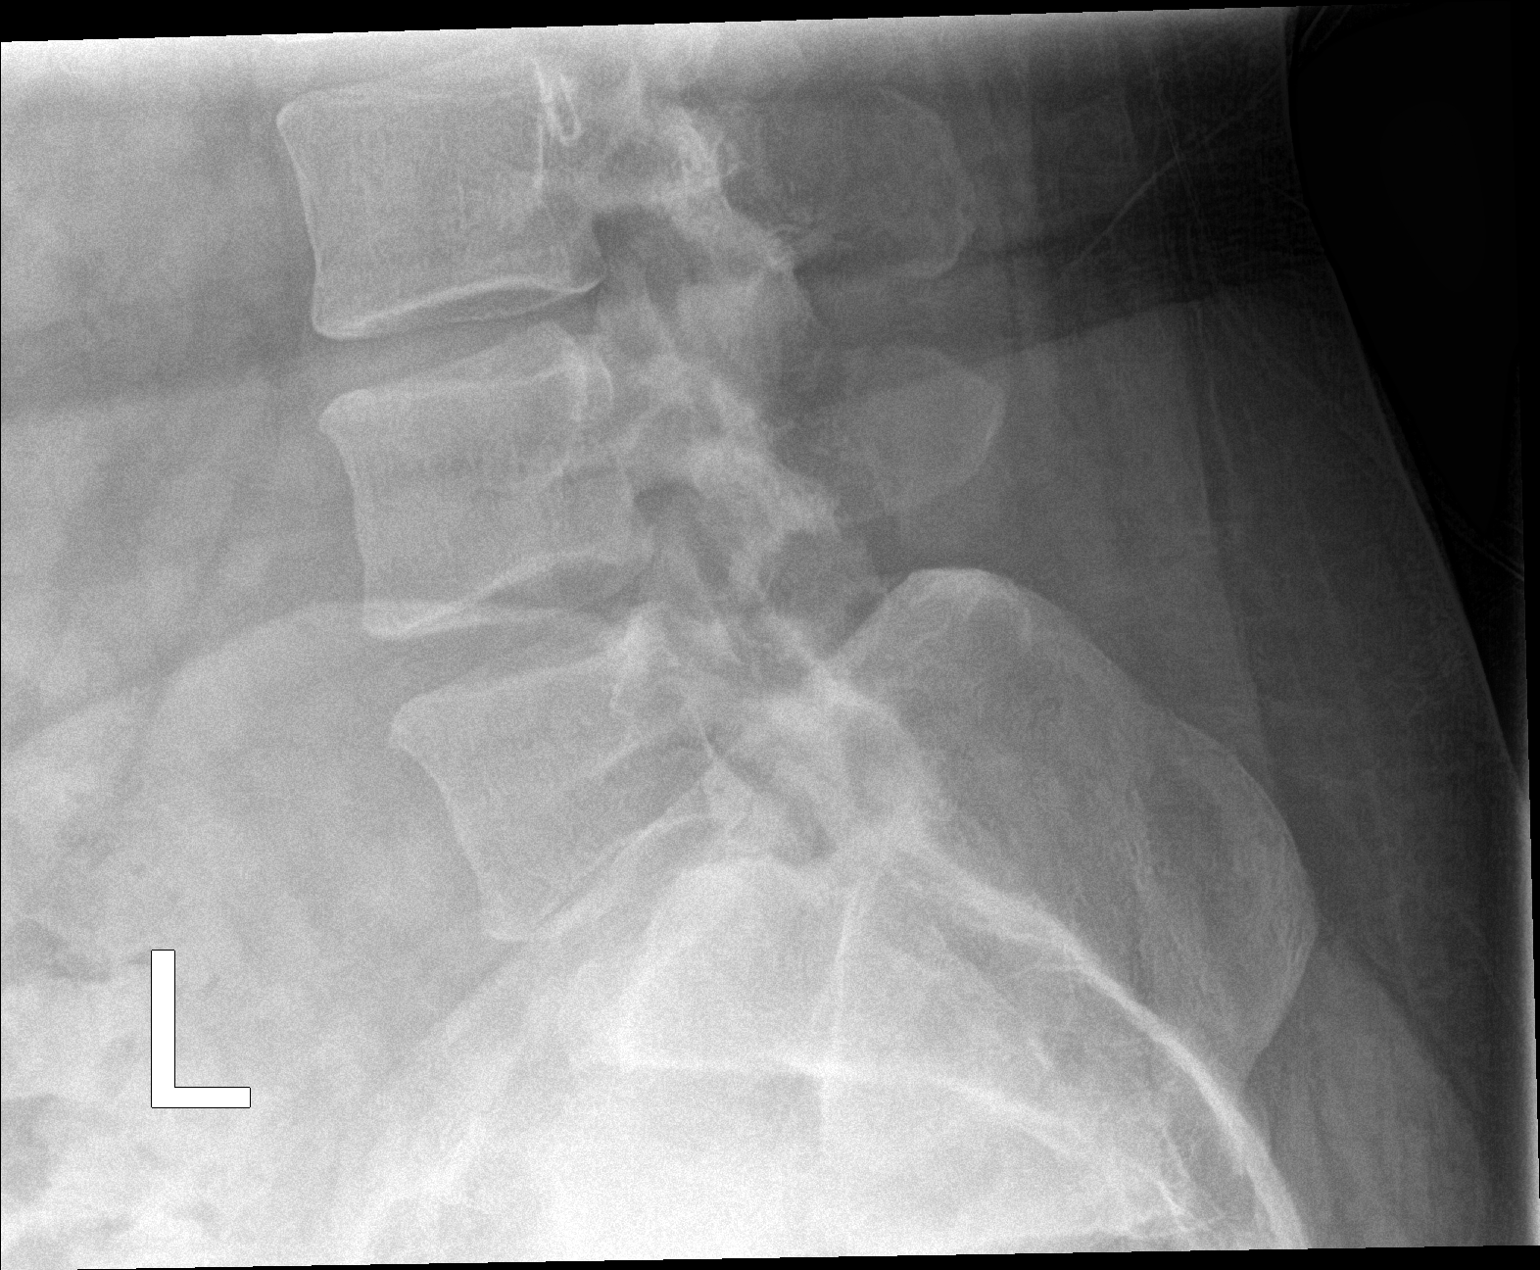

[3 of 3 positions shown; findings below may reference images not displayed]

FINDINGS: There is no evidence of lumbar spine fracture. Alignment is normal.
Intervertebral disc spaces are maintained.

Sclerotic osteophyte left SI joint.
IMPRESSION: Negative lumbar spine

Sclerotic osteophyte left SI joint.

## 2015-10-25 IMAGING — DX DG HIP (WITH OR WITHOUT PELVIS) 2-3V*L*
3 series · 3 of 3 positions shown · non-contrast
Comparison: None.

CLINICAL DATA: Low back and left hip pain, 3 months duration. No
known injury.

EXAM:
DG HIP (WITH OR WITHOUT PELVIS) 2-3V LEFT

[pelvis ap]
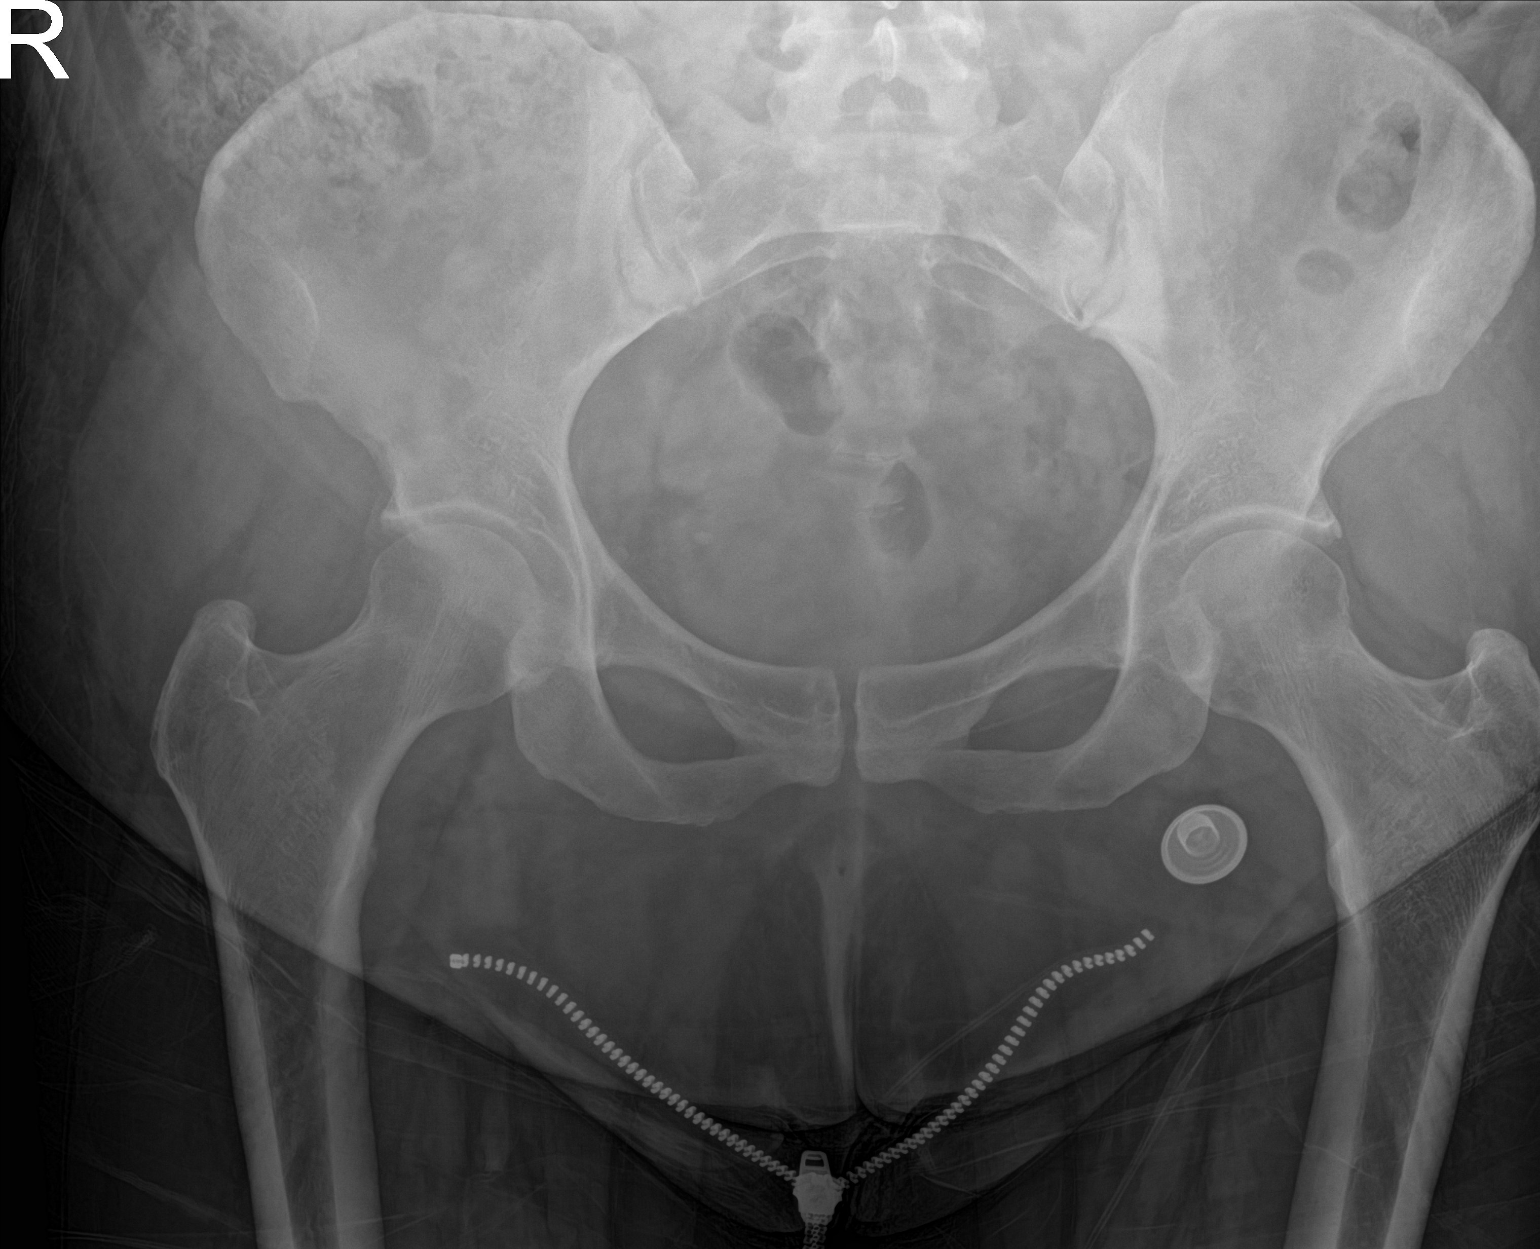

[hip ap]
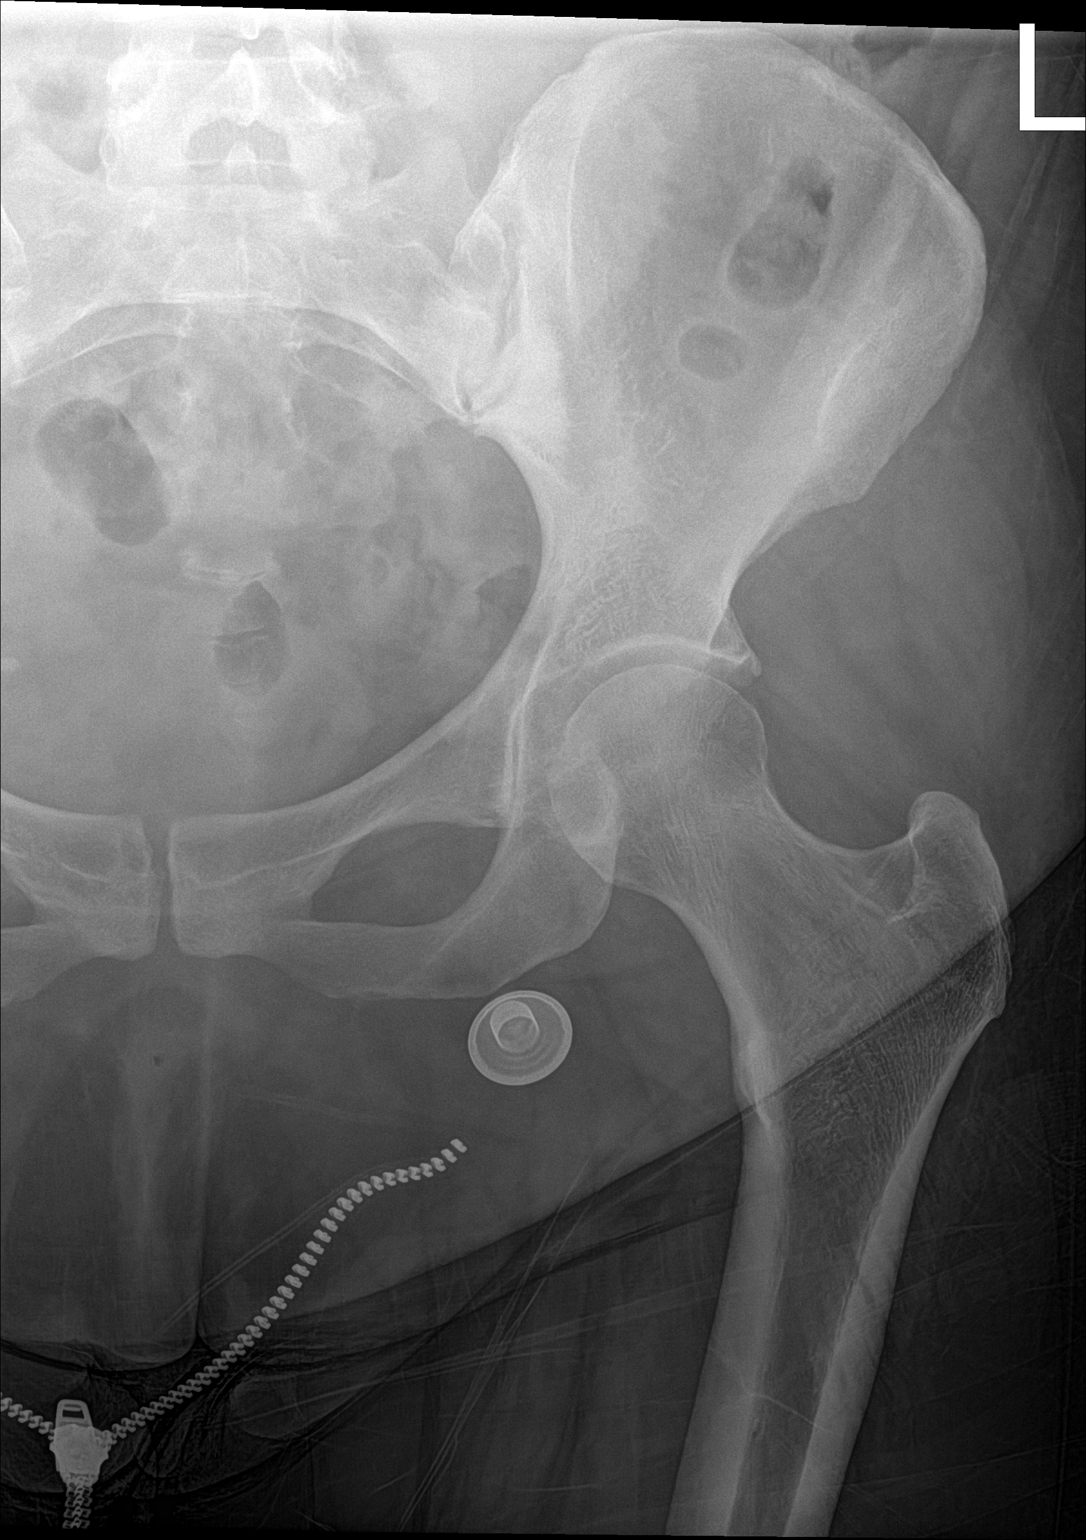

[hip lat]
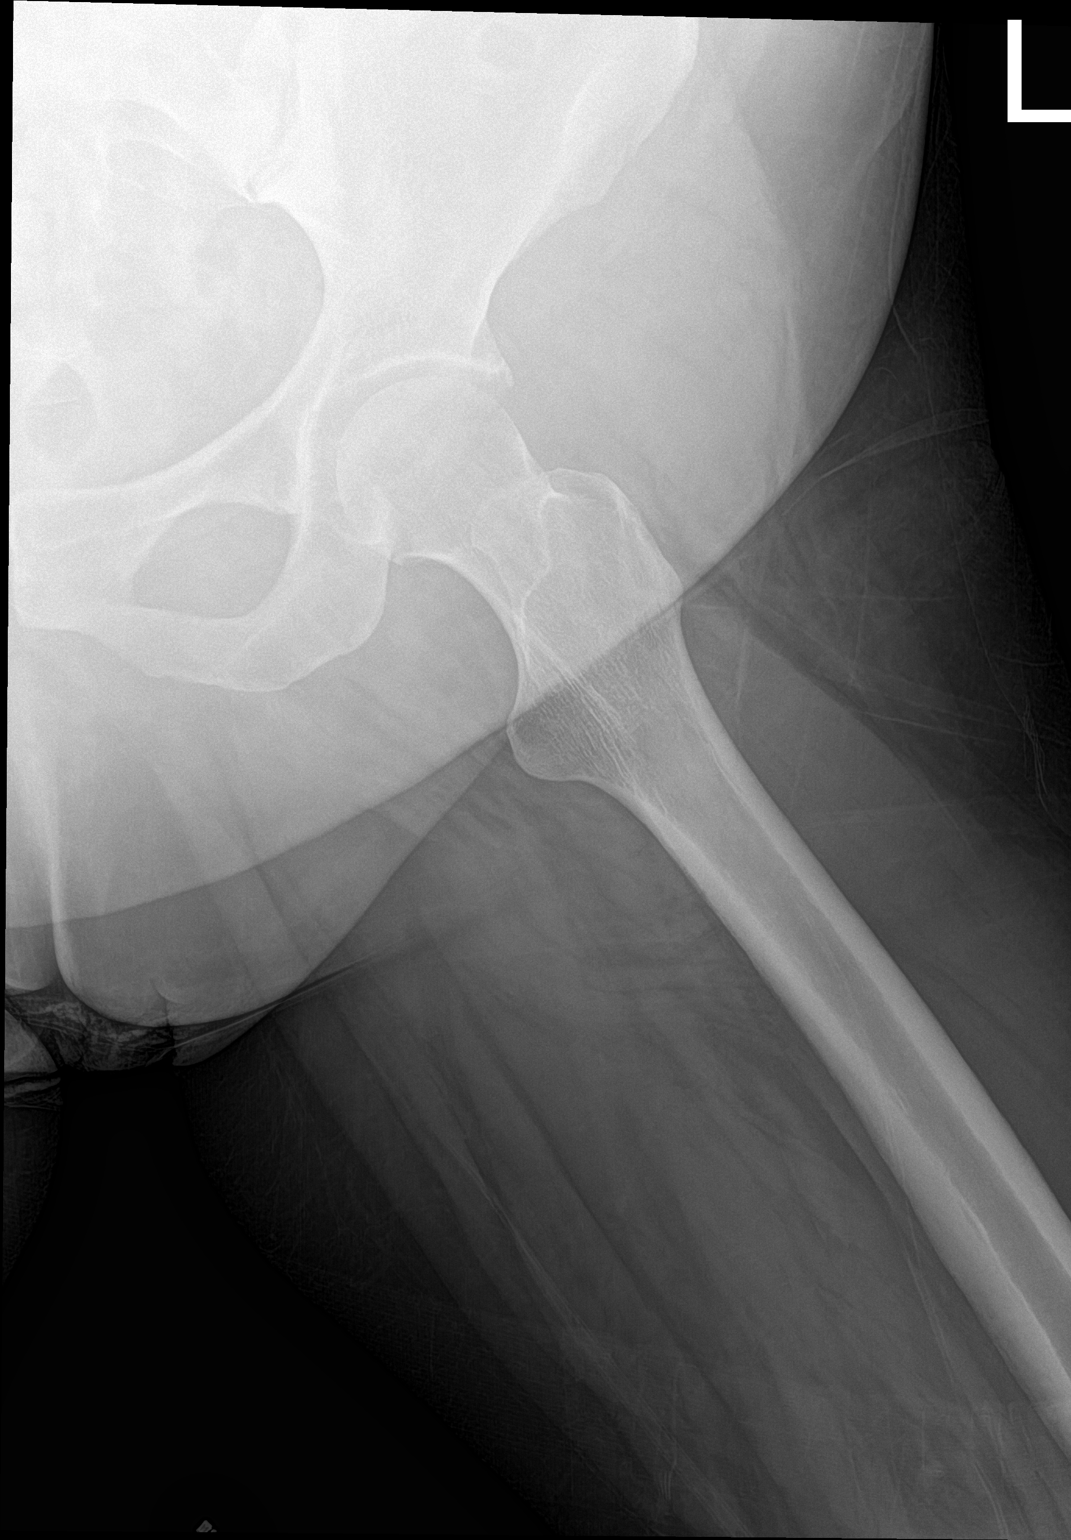

[3 of 3 positions shown; findings below may reference images not displayed]

FINDINGS: Bones of the pelvis appear normal except for some osteoarthritis of
the SI joints. Hip joints appear normal. No degenerative change. No
evidence of avascular necrosis or other focal lesion.
IMPRESSION: Normal appearance the left hip. Some osteoarthritis of the
sacroiliac joints.

## 2015-10-25 MED ORDER — LOSARTAN POTASSIUM 100 MG PO TABS: 100.0000 mg | ORAL_TABLET | Freq: Every day | ORAL | Status: DC

## 2015-10-25 MED ORDER — DICLOFENAC SODIUM 75 MG PO TBEC: 75.0000 mg | DELAYED_RELEASE_TABLET | Freq: Two times a day (BID) | ORAL | Status: DC

## 2015-10-25 NOTE — Progress Notes (Signed)
Patient ID: Angela Castillo, female   DOB: 1976/08/26, 39 y.o.   MRN: [id number]   Subjective:  Patient ID: Angela Castillo, female    DOB: 1975/12/22  Age: 39 y.o. MRN: [id number]  CC: Follow-up and Chest Pain   HPI Angela Castillo presents for   1. F/u CP: upper chest pain b/l parasternal. Comes and goes during rest and activity. At best 1-2/10. At worse 7/10. No heartburn. Compliant with BP med. Chronic non smoker. NSAID helps.   2. L hip pain: lateral pain. Worse with hip flexion and external rotation. No recent injury. Achy and burning pain. Also with intermittent R leg pain.     Social History  Substance Use Topics  . Smoking status: Never Smoker   . Smokeless tobacco: Not on file  . Alcohol Use: Yes     Comment: rarely    Outpatient Prescriptions Prior to Visit  Medication Sig Dispense Refill  . ferrous sulfate 325 (65 FE) MG tablet Take 325 mg by mouth 2 (two) times daily with a meal.    . fluticasone (FLONASE) 50 MCG/ACT nasal spray Place 2 sprays into both nostrils daily. (Patient taking differently: Place 2 sprays into both nostrils daily as needed for allergies or rhinitis. ) 16 g 6  . ibuprofen (ADVIL,MOTRIN) 200 MG tablet Take 400-1,000 mg by mouth every 6 (six) hours as needed for moderate pain.    Marland Kitchen losartan (COZAAR) 50 MG tablet Take 1 tablet (50 mg total) by mouth daily. 30 tablet 5  . meloxicam (MOBIC) 15 MG tablet Take 1 tablet (15 mg total) by mouth daily. 30 tablet 0  . Multiple Vitamins-Minerals (HAIR SKIN AND NAILS FORMULA PO) Take 2 each by mouth every morning. gummies     No facility-administered medications prior to visit.    ROS Review of Systems  Constitutional: Negative for fever and chills.  Eyes: Negative for visual disturbance.  Respiratory: Negative for shortness of breath.   Cardiovascular: Positive for chest pain.  Gastrointestinal: Negative for abdominal pain and blood in stool.  Musculoskeletal: Positive for arthralgias.  Negative for back pain.  Skin: Negative for rash.  Allergic/Immunologic: Negative for immunocompromised state.  Hematological: Negative for adenopathy. Does not bruise/bleed easily.  Psychiatric/Behavioral: Negative for suicidal ideas and dysphoric mood.    Objective:  BP 132/80 mmHg  Pulse 77  Temp(Src) 98.5 F (36.9 C) (Oral)  Resp 16  Ht  (1.6 m)  Wt 231 lb (104.781 kg)  BMI 40.93 kg/m2  SpO2 99%  LMP 10/10/2015  BP/Weight 10/25/2015 09/20/2015 08/17/2015  Systolic BP 132 132 133  Diastolic BP 80 82 91  Wt. (Lbs) 231 - 226  BMI 40.93 - 40.04   Physical Exam  Constitutional: She is oriented to person, place, and time. She appears well-developed and well-nourished. No distress.  HENT:  Head: Normocephalic and atraumatic.  Cardiovascular: Normal rate, regular rhythm, normal heart sounds and intact distal pulses.   Pulmonary/Chest: Effort normal and breath sounds normal.  Musculoskeletal: She exhibits no edema.  Back Exam: Back: Normal Curvature, no deformities or CVA tenderness  Paraspinal Tenderness: absent   LE Strength 5/5  LE Sensation: in tact  LE Reflexes 2+ and symmetric  Straight leg raise: negative  FABER: + on L    Neurological: She is alert and oriented to person, place, and time.  Skin: Skin is warm and dry. No rash noted.  Psychiatric: She has a normal mood and affect.    Assessment & Plan:  Problem List Items Addressed This Visit    Essential hypertension (Chronic)   Relevant Medications   losartan (COZAAR) 100 MG tablet   Lateral pain of left hip - Primary (Chronic)    A; suspect SI joint pain P: X-ray NSAID Hip and back exercises provided       Relevant Medications   diclofenac (VOLTAREN) 75 MG EC tablet   Other Relevant Orders   DG Lumbar Spine 2-3 Views (Completed)   DG HIP UNILAT W OR W/O PELVIS 2-3 VIEWS LEFT (Completed)   Musculoskeletal chest pain    A; MSK chest pain in HTN patient P: Continue NSAID Control BP            No orders of the defined types were placed in this encounter.    Follow-up: No Follow-up on file.   Dessa PhiJosalyn Teliah Buffalo MD

## 2015-10-25 NOTE — Patient Instructions (Addendum)
Angela Castillo was seen today for follow-up and chest pain.  Diagnoses and all orders for this visit:  Lateral pain of left hip -     DG Lumbar Spine 2-3 Views; Future -     DG HIP UNILAT W OR W/O PELVIS 2-3 VIEWS LEFT; Future -     diclofenac (VOLTAREN) 75 MG EC tablet; Take 1 tablet (75 mg total) by mouth 2 (two) times daily.  Essential hypertension -     losartan (COZAAR) 100 MG tablet; Take 1 tablet (100 mg total) by mouth daily.   F/u in 6 weeks for hip pain and BP f/u   Dr. Armen Pickup   Generic Hip Exercises RANGE OF MOTION (ROM) AND STRETCHING EXERCISES  These exercises may help you when beginning to rehabilitate your injury. Doing them too aggressively can worsen your condition. Complete them slowly and gently. Your symptoms may resolve with or without further involvement from your physician, physical therapist or athletic trainer. While completing these exercises, remember:   Restoring tissue flexibility helps normal motion to return to the joints. This allows healthier, less painful movement and activity.  An effective stretch should be held for at least 30 seconds.  A stretch should never be painful. You should only feel a gentle lengthening or release in the stretched tissue. If these stretches worsen your symptoms even when done gently, consult your physician, physical therapist or athletic trainer. STRETCH - Hamstrings, Supine   Lie on your back. Loop a belt or towel over the ball of your right / left foot.  Straighten your right / left knee and slowly pull on the belt to raise your leg. Do not allow the right / left knee to bend. Keep your opposite leg flat on the floor.  Raise the leg until you feel a gentle stretch behind your right / left knee or thigh. Hold this position for __________ seconds. Repeat __________ times. Complete this stretch __________ times per day.  STRETCH - Hip Rotators   Lie on your back on a firm surface. Grasp your right / left knee with your right  / left hand and your ankle with your opposite hand.  Keeping your hips and shoulders firmly planted, gently pull your right / left knee and rotate your lower leg toward your opposite shoulder until you feel a stretch in your buttocks.  Hold this stretch for __________ seconds. Repeat this stretch __________ times. Complete this stretch __________ times per day. STRETCH - Hamstrings/Adductors, V-Sit   Sit on the floor with your legs extended in a large "V," keeping your knees straight.  With your head and chest upright, bend at your waist reaching for your right foot to stretch your left adductors.  You should feel a stretch in your left inner thigh. Hold for __________ seconds.  Return to the upright position to relax your leg muscles.  Continuing to keep your chest upright, bend straight forward at your waist to stretch your hamstrings.  You should feel a stretch behind both of your thighs and/or knees. Hold for __________ seconds.  Return to the upright position to relax your leg muscles.  Repeat steps 2 through 4 for opposite leg. Repeat __________ times. Complete this exercise __________ times per day.  STRETCHING - Hip Flexors, Lunge  Half kneel with your right / left knee on the floor and your opposite knee bent and directly over your ankle.  Keep good posture with your head over your shoulders. Tighten your buttocks to point your tailbone downward; this  will prevent your back from arching too much.  You should feel a gentle stretch in the front of your thigh and/or hip. If you do not feel any resistance, slightly slide your opposite foot forward and then slowly lunge forward so your knee once again lines up over your ankle. Be sure your tailbone remains pointed downward.  Hold this stretch for __________ seconds. Repeat __________ times. Complete this stretch __________ times per day. STRENGTHENING EXERCISES These exercises may help you when beginning to rehabilitate your  injury. They may resolve your symptoms with or without further involvement from your physician, physical therapist or athletic trainer. While completing these exercises, remember:   Muscles can gain both the endurance and the strength needed for everyday activities through controlled exercises.  Complete these exercises as instructed by your physician, physical therapist or athletic trainer. Progress the resistance and repetitions only as guided.  You may experience muscle soreness or fatigue, but the pain or discomfort you are trying to eliminate should never worsen during these exercises. If this pain does worsen, stop and make certain you are following the directions exactly. If the pain is still present after adjustments, discontinue the exercise until you can discuss the trouble with your clinician. STRENGTH - Hip Extensors, Bridge   Lie on your back on a firm surface. Bend your knees and place your feet flat on the floor.  Tighten your buttocks muscles and lift your bottom off the floor until your trunk is level with your thighs. You should feel the muscles in your buttocks and back of your thighs working. If you do not feel these muscles, slide your feet 1-2 inches further away from your buttocks.  Hold this position for __________ seconds.  Slowly lower your hips to the starting position and allow your buttock muscles relax completely before beginning the next repetition.  If this exercise is too easy, you may cross your arms over your chest. Repeat __________ times. Complete this exercise __________ times per day.  STRENGTH - Hip Abductors, Straight Leg Raises  Be aware of your form throughout the entire exercise so that you exercise the correct muscles. Sloppy form means that you are not strengthening the correct muscles.  Lie on your side so that your head, shoulders, knee and hip line up. You may bend your lower knee to help maintain your balance. Your right / left leg should be on  top.  Roll your hips slightly forward, so that your hips are stacked directly over each other and your right / left knee is facing forward.  Lift your top leg up 4-6 inches, leading with your heel. Be sure that your foot does not drift forward or that your knee does not roll toward the ceiling.  Hold this position for __________ seconds. You should feel the muscles in your outer hip lifting (you may not notice this until your leg begins to tire).  Slowly lower your leg to the starting position. Allow the muscles to fully relax before beginning the next repetition. Repeat __________ times. Complete this exercise __________ times per day.  STRENGTH - Hip Adductors, Straight Leg Raises   Lie on your side so that your head, shoulders, knee and hip line up. You may place your upper foot in front to help maintain your balance. Your right / left leg should be on the bottom.  Roll your hips slightly forward, so that your hips are stacked directly over each other and your right / left knee is facing forward.  Tense the muscles in your inner thigh and lift your bottom leg 4-6 inches. Hold this position for __________ seconds.  Slowly lower your leg to the starting position. Allow the muscles to fully relax before beginning the next repetition. Repeat __________ times. Complete this exercise __________ times per day.  STRENGTH - Quadriceps, Straight Leg Raises  Quality counts! Watch for signs that the quadriceps muscle is working to insure you are strengthening the correct muscles and not "cheating" by substituting with healthier muscles.  Lay on your back with your right / left leg extended and your opposite knee bent.  Tense the muscles in the front of your right / left thigh. You should see either your knee cap slide up or increased dimpling just above the knee. Your thigh may even quiver.  Tighten these muscles even more and raise your leg 4 to 6 inches off the floor. Hold for right / left  seconds.  Keeping these muscles tense, lower your leg.  Relax the muscles slowly and completely in between each repetition. Repeat __________ times. Complete this exercise __________ times per day.  STRENGTH - Hip Abductors, Standing  Tie one end of a rubber exercise band/tubing to a secure surface (table, pole) and tie a loop at the other end.  Place the loop around your right / left ankle. Keeping your ankle with the band directly opposite of the secured end, step away until there is tension in the tube/band.  Hold onto a chair as needed for balance.  Keeping your back upright, your shoulders over your hips, and your toes pointing forward, lift your right / left leg out to your side. Be sure to lift your leg with your hip muscles. Do not "throw" your leg or tip your body to lift your leg.  Slowly and with control, return to the starting position. Repeat exercise __________ times. Complete this exercise __________ times per day.  STRENGTH - Quadriceps, Squats  Stand in a door frame so that your feet and knees are in line with the frame.  Use your hands for balance, not support, on the frame.  Slowly lower your weight, bending at the hips and knees. Keep your lower legs upright so that they are parallel with the door frame. Squat only within the range that does not increase your knee pain. Never let your hips drop below your knees.  Slowly return upright, pushing with your legs, not pulling with your hands.   This information is not intended to replace advice given to you by your health care provider. Make sure you discuss any questions you have with your health care provider.   Document Released: 11/08/2005 Document Revised: 11/11/2014 Document Reviewed: 02/02/2009 Elsevier Interactive Patient Education 2016 [employer] Back Exercises The following exercises strengthen the muscles that help to support the back. They also help to keep the lower back flexible. Doing these exercises  can help to prevent back pain or lessen existing pain. If you have back pain or discomfort, try doing these exercises 2-3 times each day or as told by your health care provider. When the pain goes away, do them once each day, but increase the number of times that you repeat the steps for each exercise (do more repetitions). If you do not have back pain or discomfort, do these exercises once each day or as told by your health care provider. EXERCISES Single Knee to Chest Repeat these steps 3-5 times for each leg:  Lie on your back on a firm bed  or the floor with your legs extended.  Bring one knee to your chest. Your other leg should stay extended and in contact with the floor.  Hold your knee in place by grabbing your knee or thigh.  Pull on your knee until you feel a gentle stretch in your lower back.  Hold the stretch for 10-30 seconds.  Slowly release and straighten your leg. Pelvic Tilt Repeat these steps 5-10 times:  Lie on your back on a firm bed or the floor with your legs extended.  Bend your knees so they are pointing toward the ceiling and your feet are flat on the floor.  Tighten your lower abdominal muscles to press your lower back against the floor. This motion will tilt your pelvis so your tailbone points up toward the ceiling instead of pointing to your feet or the floor.  With gentle tension and even breathing, hold this position for 5-10 seconds. Cat-Cow Repeat these steps until your lower back becomes more flexible:  Get into a hands-and-knees position on a firm surface. Keep your hands under your shoulders, and keep your knees under your hips. You may place padding under your knees for comfort.  Let your head hang down, and point your tailbone toward the floor so your lower back becomes rounded like the back of a cat.  Hold this position for 5 seconds.  Slowly lift your head and point your tailbone up toward the ceiling so your back forms a sagging arch like the  back of a cow.  Hold this position for 5 seconds. Press-Ups Repeat these steps 5-10 times:  Lie on your abdomen (face-down) on the floor.  Place your palms near your head, about shoulder-width apart.  While you keep your back as relaxed as possible and keep your hips on the floor, slowly straighten your arms to raise the top half of your body and lift your shoulders. Do not use your back muscles to raise your upper torso. You may adjust the placement of your hands to make yourself more comfortable.  Hold this position for 5 seconds while you keep your back relaxed.  Slowly return to lying flat on the floor. Bridges Repeat these steps 10 times:  Lie on your back on a firm surface.  Bend your knees so they are pointing toward the ceiling and your feet are flat on the floor.  Tighten your buttocks muscles and lift your buttocks off of the floor until your waist is at almost the same height as your knees. You should feel the muscles working in your buttocks and the back of your thighs. If you do not feel these muscles, slide your feet 1-2 inches farther away from your buttocks.  Hold this position for 3-5 seconds.  Slowly lower your hips to the starting position, and allow your buttocks muscles to relax completely. If this exercise is too easy, try doing it with your arms crossed over your chest. Abdominal Crunches Repeat these steps 5-10 times:  Lie on your back on a firm bed or the floor with your legs extended.  Bend your knees so they are pointing toward the ceiling and your feet are flat on the floor.  Cross your arms over your chest.  Tip your chin slightly toward your chest without bending your neck.  Tighten your abdominal muscles and slowly raise your trunk (torso) high enough to lift your shoulder blades a tiny bit off of the floor. Avoid raising your torso higher than that, because it can put  too much stress on your low back and it does not help to strengthen your  abdominal muscles.  Slowly return to your starting position. Back Lifts Repeat these steps 5-10 times:  Lie on your abdomen (face-down) with your arms at your sides, and rest your forehead on the floor.  Tighten the muscles in your legs and your buttocks.  Slowly lift your chest off of the floor while you keep your hips pressed to the floor. Keep the back of your head in line with the curve in your back. Your eyes should be looking at the floor.  Hold this position for 3-5 seconds.  Slowly return to your starting position. SEEK MEDICAL CARE IF:  Your back pain or discomfort gets much worse when you do an exercise.  Your back pain or discomfort does not lessen within 2 hours after you exercise. If you have any of these problems, stop doing these exercises right away. Do not do them again unless your health care provider says that you can. SEEK IMMEDIATE MEDICAL CARE IF:  You develop sudden, severe back pain. If this happens, stop doing the exercises right away. Do not do them again unless your health care provider says that you can.   This information is not intended to replace advice given to you by your health care provider. Make sure you discuss any questions you have with your health care provider.   Document Released: 11/28/2004 Document Revised: 07/12/2015 Document Reviewed: 12/15/2014 Elsevier Interactive Patient Education [employer]

## 2015-10-25 NOTE — Progress Notes (Signed)
Patients here for f/up chest pain. Patient reports still feeling chest pain rated 1/10, but can reach 7-8/10 off and on described as dull/sharp.   Patient having left hip pain x3 months range of motion is limited described as painful and uncomfortable, pain rated  8/10.  Patient concern of sciatica nerve damage.

## 2015-10-25 NOTE — Assessment & Plan Note (Signed)
A; suspect SI joint pain P: X-ray NSAID Hip and back exercises provided

## 2015-10-25 NOTE — Assessment & Plan Note (Signed)
A; MSK chest pain in HTN patient P: Continue NSAID Control BP

## 2015-11-02 NOTE — Telephone Encounter (Signed)
Error

## 2015-11-06 ENCOUNTER — Emergency Department (HOSPITAL_COMMUNITY): Payer: BLUE CROSS/BLUE SHIELD

## 2015-11-06 ENCOUNTER — Emergency Department (HOSPITAL_COMMUNITY)
Admission: EM | Admit: 2015-11-06 | Discharge: 2015-11-06 | Disposition: A | Discharge status: Home / Self Care | Payer: BLUE CROSS/BLUE SHIELD | Attending: Emergency Medicine | Admitting: Emergency Medicine

## 2015-11-06 ENCOUNTER — Encounter (HOSPITAL_COMMUNITY): Payer: Self-pay | Admitting: Emergency Medicine

## 2015-11-06 DIAGNOSIS — D649 Anemia, unspecified: Secondary | ICD-10-CM | POA: Insufficient documentation

## 2015-11-06 DIAGNOSIS — M25512 Pain in left shoulder: Secondary | ICD-10-CM

## 2015-11-06 DIAGNOSIS — Y998 Other external cause status: Secondary | ICD-10-CM | POA: Diagnosis not present

## 2015-11-06 DIAGNOSIS — S4992XA Unspecified injury of left shoulder and upper arm, initial encounter: Secondary | ICD-10-CM | POA: Insufficient documentation

## 2015-11-06 DIAGNOSIS — I10 Essential (primary) hypertension: Secondary | ICD-10-CM | POA: Diagnosis not present

## 2015-11-06 DIAGNOSIS — W07XXXA Fall from chair, initial encounter: Secondary | ICD-10-CM | POA: Insufficient documentation

## 2015-11-06 DIAGNOSIS — Z8719 Personal history of other diseases of the digestive system: Secondary | ICD-10-CM | POA: Diagnosis not present

## 2015-11-06 DIAGNOSIS — Z79899 Other long term (current) drug therapy: Secondary | ICD-10-CM | POA: Insufficient documentation

## 2015-11-06 DIAGNOSIS — Z791 Long term (current) use of non-steroidal anti-inflammatories (NSAID): Secondary | ICD-10-CM | POA: Diagnosis not present

## 2015-11-06 DIAGNOSIS — Y9289 Other specified places as the place of occurrence of the external cause: Secondary | ICD-10-CM | POA: Insufficient documentation

## 2015-11-06 DIAGNOSIS — Y9389 Activity, other specified: Secondary | ICD-10-CM | POA: Insufficient documentation

## 2015-11-06 IMAGING — CR DG SHOULDER 2+V*L*
3 series · 3 of 3 positions shown · non-contrast
Comparison: None.

CLINICAL DATA: Left shoulder pain after injury. Pulled by a dog out
of a chair, landing on left side 3 days prior.

EXAM:
LEFT SHOULDER - 2+ VIEW

[w shoulder external left]
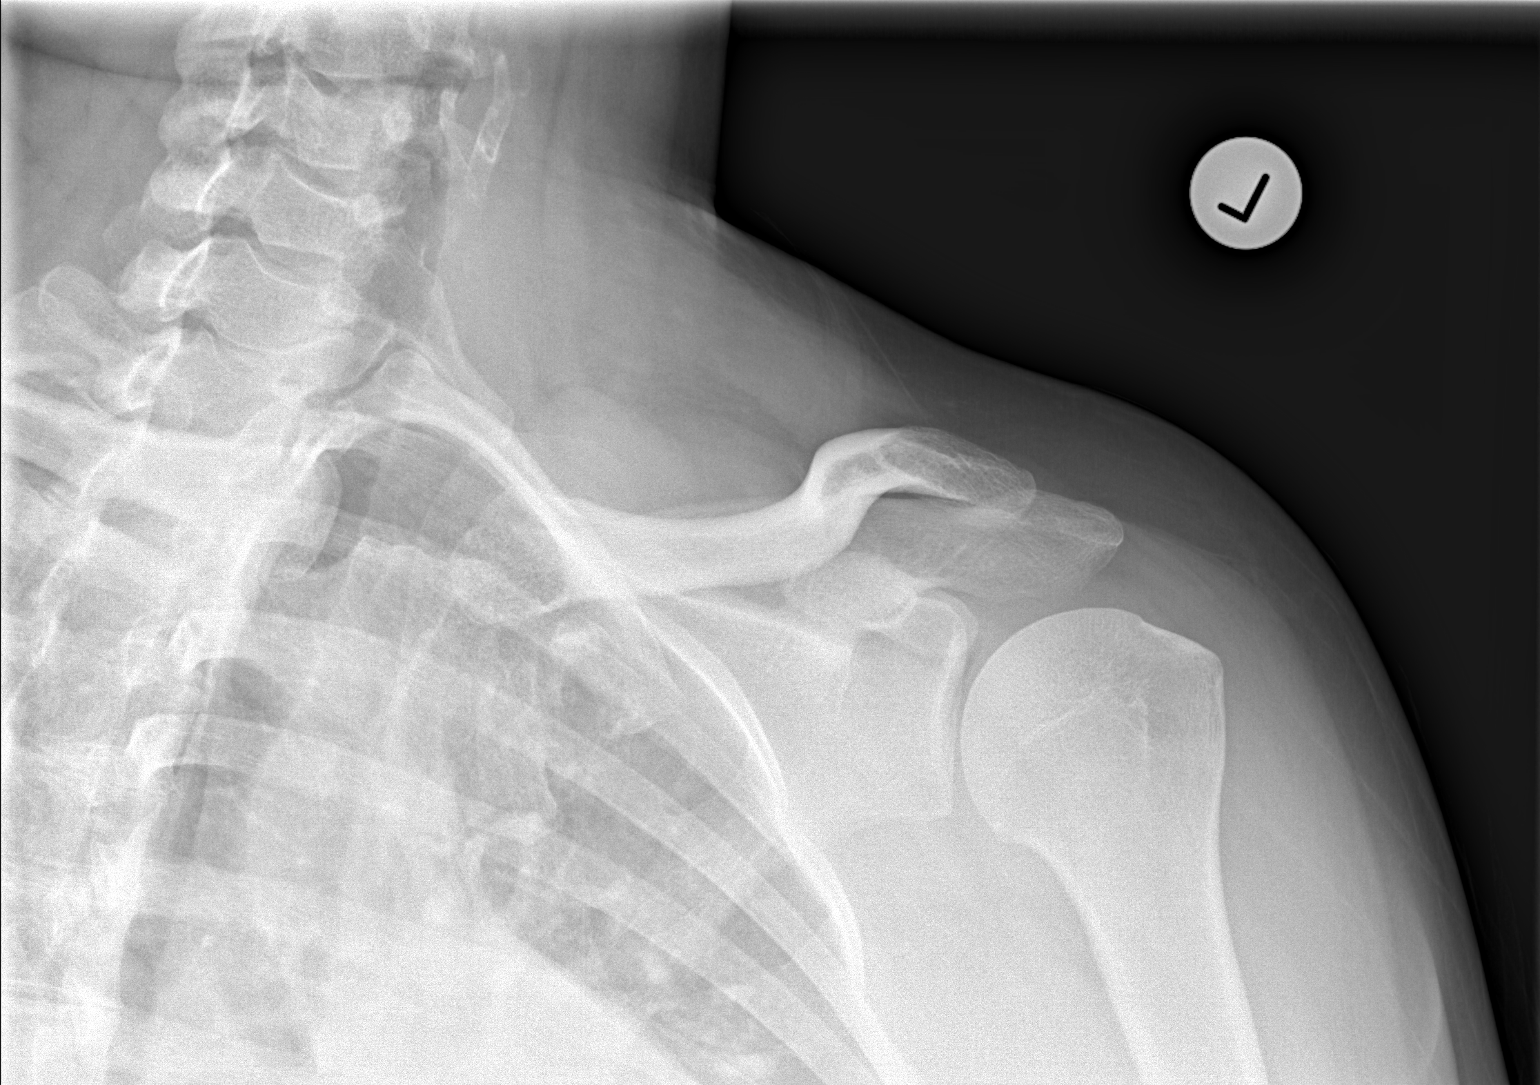

[w shoulder y-view left]
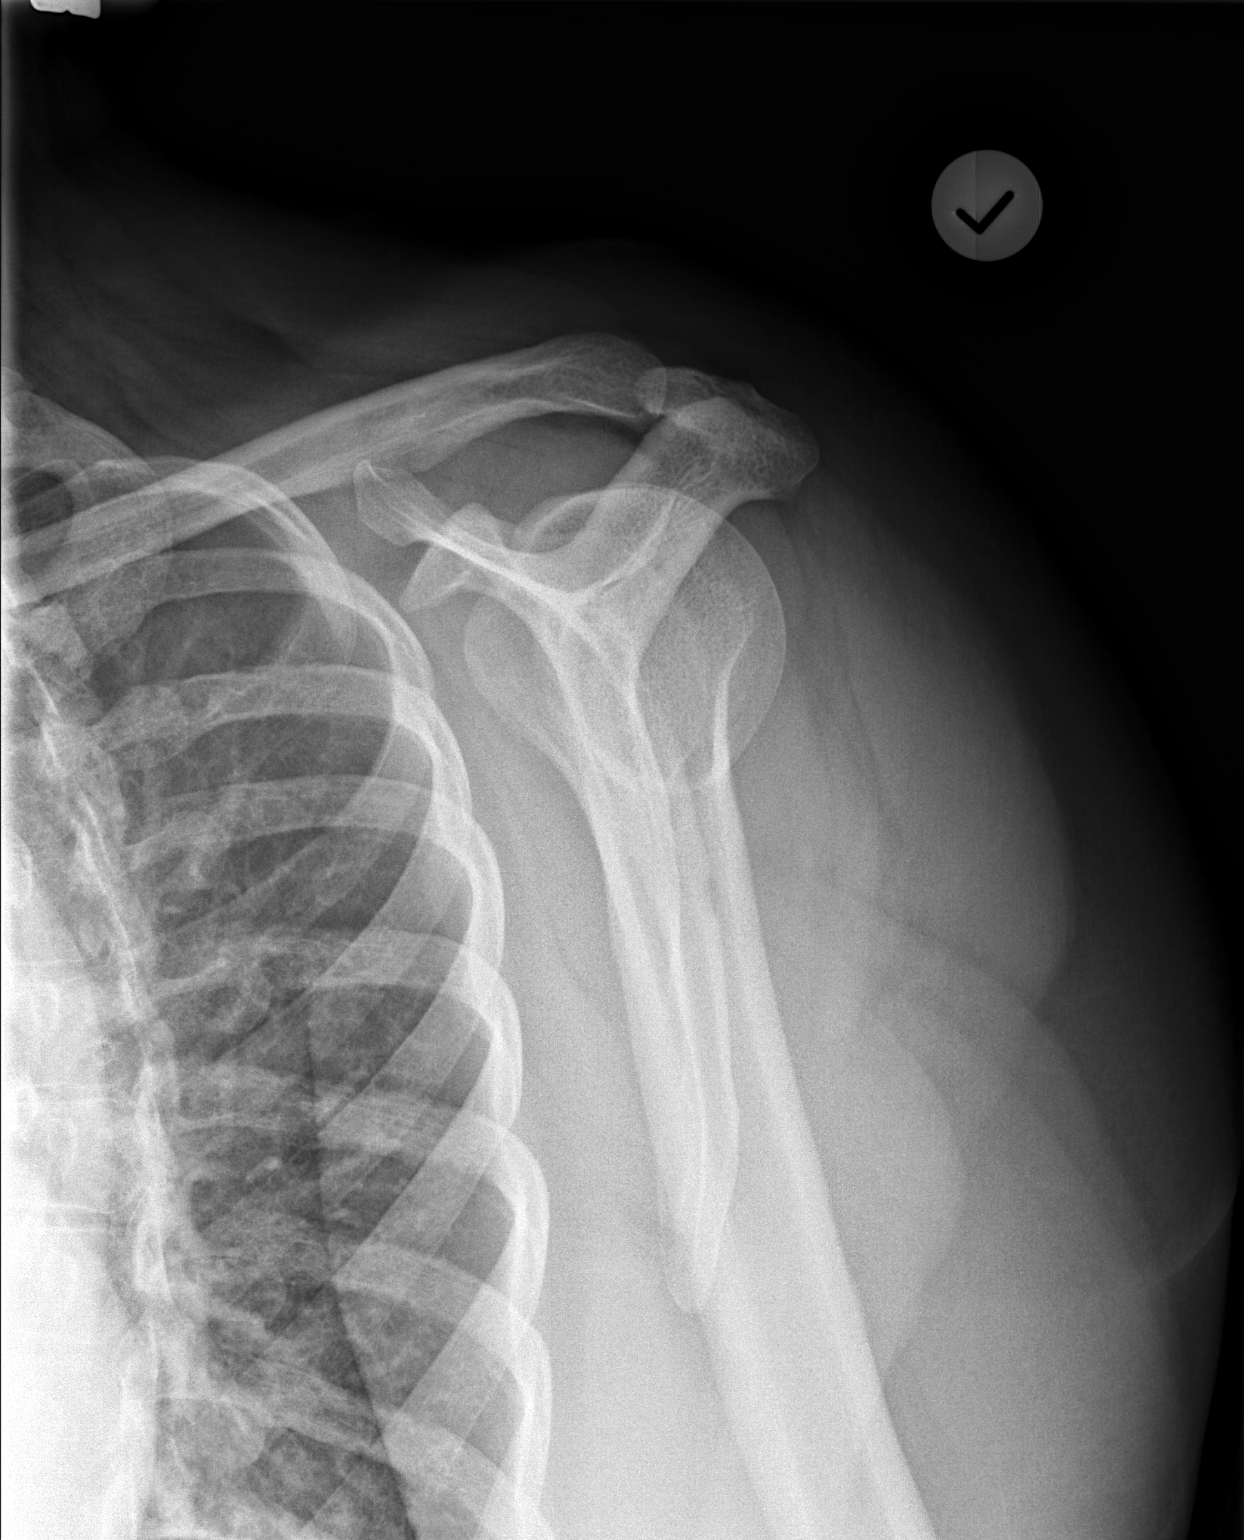

[x shoulder axillary left]
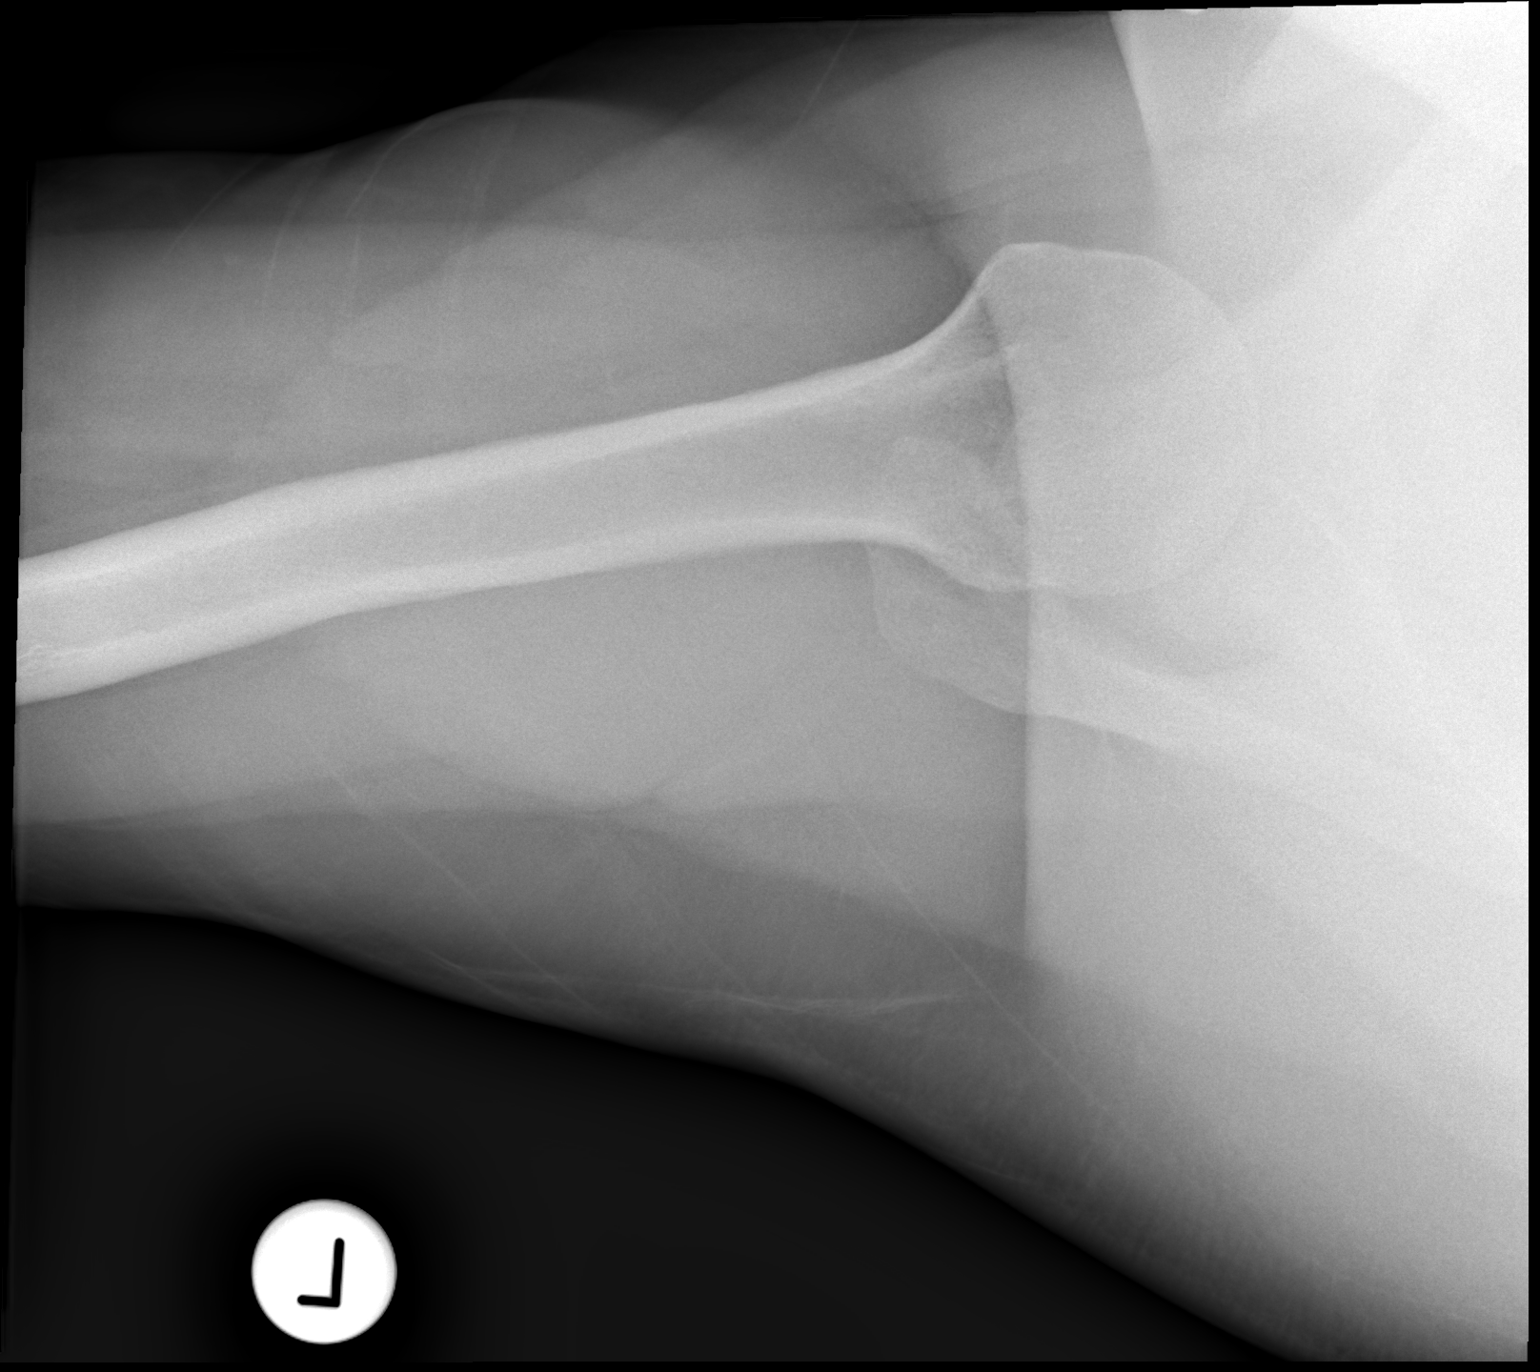

[3 of 3 positions shown; findings below may reference images not displayed]

FINDINGS: No fracture or dislocation. The alignment and joint spaces are
maintained. No soft tissue abnormality.
IMPRESSION: No fracture or dislocation of the left shoulder.

## 2015-11-06 MED ORDER — METHOCARBAMOL 500 MG PO TABS: 500.0000 mg | ORAL_TABLET | Freq: Two times a day (BID) | ORAL | Status: DC

## 2015-11-06 NOTE — ED Notes (Signed)
PT DISCHARGED. INSTRUCTIONS AND PRESCRIPTION GIVEN. AAOX3. PT IN NO APPARENT DISTRESS. THE OPPORTUNITY TO ASK QUESTIONS WAS PROVIDED. 

## 2015-11-06 NOTE — ED Notes (Signed)
Per pt, states she fell out of chair while holding dog-now having left shoulder pain

## 2015-11-06 NOTE — ED Provider Notes (Signed)
CSN: [id number]     Arrival date & time 11/06/15  1627 History   By signing my name below, I, Forrestine Him, attest that this documentation has been prepared under the direction and in the presence of [facility].  Electronically Signed: Forrestine Him, ED Scribe. 11/06/2015. 6:22 PM.   Chief Complaint  Patient presents with  . Shoulder Pain   The history is provided by the patient. No language interpreter was used.     HPI Comments: Angela Castillo is a 40 y.o. female  who presents to the Emergency Department complaining of constant, ongoing L shoulder pain x 2 days. Pt states she fell out of a chair while holding a dog. She states the dog dragged her down to the ground resulting in her landing on the L shoulder. No head trauma or LOC at time of incident. Discomfort is made worse with movement. No alleviating factors at this time. Pt has attempted prescribed Diclofenac and ice application to the shoulder with mild improvement. No recent fever or chills. No weakness, loss of sensation, or numbness.  PCP: Minerva Ends, MD    Past Medical History  Diagnosis Date  . Reflux   . Elevated liver enzymes   . Anemia Dx January 03 2015  . Hypertension Dx Apr 2016   Past Surgical History  Procedure Laterality Date  . Tubal ligation  Jul 25, 2000   Family History  Problem Relation Age of Onset  . COPD Mother   . Esophageal cancer Mother   . Diabetes Mother   . Cancer Mother   . Hypertension Mother   . Diabetes Father    Social History  Substance Use Topics  . Smoking status: Never Smoker   . Smokeless tobacco: None  . Alcohol Use: Yes     Comment: rarely   OB History    No data available     Review of Systems  Constitutional: Negative for fever and chills.  Musculoskeletal: Positive for arthralgias. Negative for joint swelling.  Neurological: Negative for weakness and numbness.  Psychiatric/Behavioral: Negative for confusion.      Allergies  Review of patient's  allergies indicates no known allergies.  Home Medications   Prior to Admission medications   Medication Sig Start Date End Date Taking? Authorizing Provider  diclofenac (VOLTAREN) 75 MG EC tablet Take 1 tablet (75 mg total) by mouth 2 (two) times daily. 10/25/15   Josalyn Funches, MD  ferrous sulfate 325 (65 FE) MG tablet Take 325 mg by mouth 2 (two) times daily with a meal.    Historical Provider, MD  fluticasone (FLONASE) 50 MCG/ACT nasal spray Place 2 sprays into both nostrils daily. Patient taking differently: Place 2 sprays into both nostrils daily as needed for allergies or rhinitis.  03/01/15   Josalyn Funches, MD  losartan (COZAAR) 100 MG tablet Take 1 tablet (100 mg total) by mouth daily. 10/25/15   Josalyn Funches, MD  methocarbamol (ROBAXIN) 500 MG tablet Take 1 tablet (500 mg total) by mouth 2 (two) times daily. 11/06/15   Ozella Almond Ward, PA-C  Multiple Vitamins-Minerals (HAIR SKIN AND NAILS FORMULA PO) Take 2 each by mouth every morning. gummies    Historical Provider, MD   Triage Vitals: BP 135/90 mmHg  Pulse 70  Temp(Src) 98.3 F (36.8 C) (Oral)  Resp 15  SpO2 99%  LMP 11/06/2015   Physical Exam  Constitutional: She is oriented to person, place, and time. She appears well-developed and well-nourished.  HENT:  Head:  Normocephalic.  Eyes: EOM are normal.  Neck: Normal range of motion.  Cardiovascular: Normal rate, regular rhythm and normal heart sounds.   Pulmonary/Chest: Effort normal.  Abdominal: She exhibits no distension.  Musculoskeletal:  Full ROM of left UE with mild discomfort TTP as depicted in image 5/5 muscle strength  Neurological: She is alert and oriented to person, place, and time.  Bilateral upper extremities NVI  Skin: No rash noted.  Psychiatric: She has a normal mood and affect.  Nursing note and vitals reviewed.   ED Course  Procedures (including critical care time)  DIAGNOSTIC STUDIES: Oxygen Saturation is 99% on room air, normal by my  interpretation.    COORDINATION OF CARE: 6:14 PM- Will order DG shoulder L. Discussed treatment plan with pt at bedside and pt agreed to plan.     Labs Review Labs Reviewed - No data to display  Imaging Review Dg Shoulder Left  11/06/2015  CLINICAL DATA:  Left shoulder pain after injury. Pulled by a dog out of a chair, landing on left side 3 days prior. EXAM: LEFT SHOULDER - 2+ VIEW COMPARISON:  None. FINDINGS: No fracture or dislocation. The alignment and joint spaces are maintained. No soft tissue abnormality. IMPRESSION: No fracture or dislocation of the left shoulder. Electronically Signed   By: Jeb Levering M.D.   On: 11/06/2015 18:10   I have personally reviewed and evaluated these images and lab results as part of my medical decision-making.   EKG Interpretation None      MDM   Final diagnoses:  Shoulder pain, acute, left   Angela Castillo presents with shoulder pain after fall. Xrays normal. Benign exam. Will send home with robaxin and instructions for home care. PCP follow up  I personally performed the services described in this documentation, which was scribed in my presence. The recorded information has been reviewed and is accurate.  [facility] Ward, PA-C 11/06/15 1849  Leonard Schwartz, MD 11/12/15 [phone]

## 2015-11-06 NOTE — Discharge Instructions (Signed)
1. Medications: robaxin as needed for pain - This can make you very drowsy - please do not drink or drive on this medication, continue voltaren as needed for pain, continue usual home medications 2. Treatment: rest, drink plenty of fluids, ice area (instructions below)  3. Follow Up: Please follow up with your primary doctor in 3 days for discussion of your diagnoses and further evaluation after today's visit; Please return to the ER for any new or worsening symptoms, any additional concerns.   COLD THERAPY DIRECTIONS:  Ice or gel packs can be used to reduce both pain and swelling. Ice is the most helpful within the first 24 to 48 hours after an injury or flareup from overusing a muscle or joint.  Ice is effective, has very few side effects, and is safe for most people to use.   If you expose your skin to cold temperatures for too long or without the proper protection, you can damage your skin or nerves. Watch for signs of skin damage due to cold.   HOME CARE INSTRUCTIONS  Follow these tips to use ice and cold packs safely.  Place a dry or damp towel between the ice and skin. A damp towel will cool the skin more quickly, so you may need to shorten the time that the ice is used.  For a more rapid response, add gentle compression to the ice.  Ice for no more than 10 to 20 minutes at a time. The bonier the area you are icing, the less time it will take to get the benefits of ice.  Check your skin after 5 minutes to make sure there are no signs of a poor response to cold or skin damage.  Rest 20 minutes or more in between uses.  Once your skin is numb, you can end your treatment. You can test numbness by very lightly touching your skin. The touch should be so light that you do not see the skin dimple from the pressure of your fingertip. When using ice, most people will feel these normal sensations in this order: cold, burning, aching, and numbness.  Do not use ice on someone who cannot communicate their  responses to pain, such as small children or people with dementia.

## 2015-11-07 MED FILL — LOSARTAN POTASSIUM 100 MG T: 100 | 30 days supply | Qty: 30 | Fill #0

## 2015-12-06 MED FILL — LOSARTAN POTASSIUM 100 MG T: 100 | 30 days supply | Qty: 30 | Fill #1

## 2015-12-26 ENCOUNTER — Ambulatory Visit: Payer: BLUE CROSS/BLUE SHIELD | Attending: Family Medicine | Admitting: Family Medicine

## 2015-12-26 ENCOUNTER — Encounter: Payer: Self-pay | Admitting: Family Medicine

## 2015-12-26 VITALS — BP 130/85 | HR 81 | Temp 98.3°F | Resp 16 | Ht 63.0 in | Wt 231.0 lb

## 2015-12-26 DIAGNOSIS — M722 Plantar fascial fibromatosis: Secondary | ICD-10-CM | POA: Diagnosis not present

## 2015-12-26 DIAGNOSIS — M79672 Pain in left foot: Secondary | ICD-10-CM | POA: Diagnosis not present

## 2015-12-26 DIAGNOSIS — Z79899 Other long term (current) drug therapy: Secondary | ICD-10-CM | POA: Insufficient documentation

## 2015-12-26 DIAGNOSIS — M79671 Pain in right foot: Secondary | ICD-10-CM | POA: Diagnosis not present

## 2015-12-26 DIAGNOSIS — M79643 Pain in unspecified hand: Secondary | ICD-10-CM | POA: Diagnosis present

## 2015-12-26 DIAGNOSIS — M25552 Pain in left hip: Secondary | ICD-10-CM | POA: Diagnosis present

## 2015-12-26 MED ORDER — TRAMADOL HCL 50 MG PO TABS: 50.0000 mg | ORAL_TABLET | Freq: Three times a day (TID) | ORAL | Status: DC | PRN

## 2015-12-26 NOTE — Progress Notes (Signed)
Subjective:  Patient ID: Angela Castillo, female    DOB: Jun 06, 1976  Age: 40 y.o. MRN: [id number]  CC: Follow-up; Plantar Fasciitis; and Hand Pain   HPI Angela Castillo presents for    1. L hip pain: persistent. No improvement with NSAID. Patient not doing home PT. Pain is lateral hip. Worse with walking. No falls.   2. B/l heel pain; recurrent. Requesting referral back to podiatry. She has not purchased recommended insoles. 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    Social History  Substance Use Topics  . Smoking status: Never Smoker   . Smokeless tobacco: Not on file  . Alcohol Use: Yes     Comment: rarely    Outpatient Prescriptions Prior to Visit  Medication Sig Dispense Refill  . diclofenac (VOLTAREN) 75 MG EC tablet Take 1 tablet (75 mg total) by mouth 2 (two) times daily. 30 tablet 0  . ferrous sulfate 325 (65 FE) MG tablet Take 325 mg by mouth 2 (two) times daily with a meal.    . fluticasone (FLONASE) 50 MCG/ACT nasal spray Place 2 sprays into both nostrils daily. (Patient taking differently: Place 2 sprays into both nostrils daily as needed for allergies or rhinitis. ) 16 g 6  . losartan (COZAAR) 100 MG tablet Take 1 tablet (100 mg total) by mouth daily. 30 tablet 5  . methocarbamol (ROBAXIN) 500 MG tablet Take 1 tablet (500 mg total) by mouth 2 (two) times daily. 20 tablet 0  . Multiple Vitamins-Minerals (HAIR SKIN AND NAILS FORMULA PO) Take 2 each by mouth every morning. gummies     No facility-administered medications prior to visit.    ROS Review of Systems  Constitutional: Negative for fever and chills.  Eyes: Negative for visual disturbance.  Respiratory: Negative for shortness of breath.   Gastrointestinal: Negative for abdominal pain and blood in stool.  Musculoskeletal: Positive for myalgias, back pain and arthralgias.  Skin: Negative for rash.  Allergic/Immunologic: Negative for immunocompromised  state.  Hematological: Negative for adenopathy. Does not bruise/bleed easily.  Psychiatric/Behavioral: Negative for suicidal ideas and dysphoric mood.    Objective:  BP 130/85 mmHg  Pulse 81  Temp(Src) 98.3 F (36.8 C) (Oral)  Resp 16  Ht  (1.6 m)  Wt 231 lb (104.781 kg)  BMI 40.93 kg/m2  SpO2 98%  BP/Weight 12/26/2015 11/06/2015 10/25/2015  Systolic BP 130 135 132  Diastolic BP 85 90 80  Wt. (Lbs) 231 - 231  BMI 40.93 - 40.93    Physical Exam  Constitutional: She is oriented to person, place, and time. She appears well-developed and well-nourished. No distress.  HENT:  Head: Normocephalic and atraumatic.  Cardiovascular: Normal rate, regular rhythm, normal heart sounds and intact distal pulses.   Pulmonary/Chest: Effort normal and breath sounds normal.  Musculoskeletal: She exhibits no edema.  Back Exam: Back: Normal Curvature, no deformities or CVA tenderness  Paraspinal Tenderness: absent   LE Strength 5/5  LE Sensation: in tact  LE Reflexes 2+ and symmetric  Straight leg raise: negative  FABER: + on L    Neurological: She is alert and oriented to person, place, and time.  Skin: Skin is warm and dry. No rash noted.  Psychiatric: She has a normal mood and affect.     Assessment & Plan:  Angela Castillo was seen today for follow-up, plantar fasciitis and hand pain.  Diagnoses and all orders for this visit:  Heel pain, bilateral -  Ambulatory referral to Podiatry -     traMADol (ULTRAM) 50 MG tablet; Take 1 tablet (50 mg total) by mouth every 8 (eight) hours as needed.  Lateral pain of left hip -     traMADol (ULTRAM) 50 MG tablet; Take 1 tablet (50 mg total) by mouth every 8 (eight) hours as needed. -     Ambulatory referral to Physical Therapy -     Cancel: Uric Acid -     Cancel: Sedimentation Rate -     Cancel: C-reactive protein -     Cancel: Rheumatoid factor -     C-reactive protein; Future -     Uric acid; Future -     Rheumatoid factor; Future -      Sedimentation rate; Future   Follow-up: No Follow-up on file.   Dessa Phi MD

## 2015-12-26 NOTE — Progress Notes (Signed)
Patient's here for f/up hip pain. Patient states pain is still present, sxs has not gotten better or worse.  Patient c/o pain in Left index finger that has since then subsided.   Planter fascitis has returned. Patient has questions about if needing another referral to the foot clinic.  Patient has already been screening for diabetes

## 2015-12-26 NOTE — Patient Instructions (Addendum)
Angela Castillo was seen today for follow-up, plantar fasciitis and hand pain.  Diagnoses and all orders for this visit:  Heel pain, bilateral -     Ambulatory referral to Podiatry -     traMADol (ULTRAM) 50 MG tablet; Take 1 tablet (50 mg total) by mouth every 8 (eight) hours as needed.  Lateral pain of left hip -     traMADol (ULTRAM) 50 MG tablet; Take 1 tablet (50 mg total) by mouth every 8 (eight) hours as needed. -     Ambulatory referral to Physical Therapy -     Uric Acid -     Sedimentation Rate -     C-reactive protein -     Rheumatoid factor   Work out 30 minutes 5 days a week. Exercise if very help for arthritis.  You will be called with lab results and physical therapy information  Dr. Armen Pickup

## 2015-12-27 ENCOUNTER — Ambulatory Visit: Payer: BLUE CROSS/BLUE SHIELD | Attending: Family Medicine

## 2015-12-27 DIAGNOSIS — M25552 Pain in left hip: Secondary | ICD-10-CM | POA: Insufficient documentation

## 2015-12-27 LAB — SEDIMENTATION RATE: Sed Rate: 11 mm/hr (ref 0–20)

## 2015-12-27 MED FILL — traMADol HCL 50 MG TABS: 50 | 20 days supply | Qty: 60 | Fill #0

## 2015-12-27 NOTE — Assessment & Plan Note (Signed)
A; recurrent pain consistent with plantar fascitis P: NSAID Ice  Weight loss Podiatry referral

## 2015-12-27 NOTE — Assessment & Plan Note (Signed)
A: persistent pain in patient with multiple pain complaints P; Add tramadol PT Check inflammatory markers

## 2015-12-27 NOTE — Patient Instructions (Signed)
Patient aware of receiving a FU with results.

## 2015-12-28 LAB — C-REACTIVE PROTEIN: CRP: 0.6 mg/dL — ABNORMAL HIGH (ref <0.60)

## 2015-12-28 LAB — RHEUMATOID FACTOR: Rheumatoid fact SerPl-aCnc: <10 IU/mL (ref <=14)

## 2015-12-28 LAB — URIC ACID: Uric Acid, Serum: 4.4 mg/dL (ref 2.4–7.0)

## 2016-01-04 MED FILL — LOSARTAN POTASSIUM 100 MG T: 100 | 30 days supply | Qty: 30 | Fill #2

## 2016-01-17 ENCOUNTER — Ambulatory Visit: Payer: BLUE CROSS/BLUE SHIELD | Attending: Family Medicine

## 2016-01-17 DIAGNOSIS — M25659 Stiffness of unspecified hip, not elsewhere classified: Secondary | ICD-10-CM | POA: Insufficient documentation

## 2016-01-17 DIAGNOSIS — M25552 Pain in left hip: Secondary | ICD-10-CM | POA: Insufficient documentation

## 2016-01-17 DIAGNOSIS — R293 Abnormal posture: Secondary | ICD-10-CM | POA: Diagnosis present

## 2016-01-17 NOTE — Patient Instructions (Signed)
Perform all exercises below:  Hold _20___ seconds. Repeat _3___ times.  Do __3__ sessions per day. CAUTION: Movement should be gentle, steady and slow.  Knee to Chest  Lying supine, bend involved knee to chest. Perform with each leg.   Lumbar Rotation: Caudal - Bilateral (Supine)  Feet and knees together, arms outstretched, rotate knees left, turning head in opposite direction, until stretch is felt.      HIP: Hamstrings - Short Sitting   Rest leg on raised surface. Keep knee straight. Lift chest.   Piriformis Stretch, Sitting    Sit, one ankle on opposite knee, same-side hand on crossed knee. Push down on knee, keeping spine straight. Lean torso forward, with flat back, until tension is felt in hamstrings and gluteals of crossed-leg side. Copyright  VHI. All rights reserved.    [facility] [address], [address] [place], [place] [address] Phone # [phone] Fax [phone]

## 2016-01-17 NOTE — Therapy (Signed)
[facility] [address], [place], [place], [address] Phone: [phone]   Fax:  [phone]  Physical Therapy Evaluation  Patient Details  Name: Angela Castillo MRN: [id number] Date of Birth: 10-02-1976 Referring Provider: Boykin Nearing, MD  Encounter Date: 01/17/2016      PT End of Session - 01/17/16 1212    Visit Number 1   Date for PT Re-Evaluation 03/13/16   PT Start Time 6962   PT Stop Time 1220   PT Time Calculation (min) 36 min   Activity Tolerance Patient tolerated treatment well   Behavior During Therapy [facility] for tasks assessed/performed      Past Medical History  Diagnosis Date  . Reflux   . Elevated liver enzymes   . Anemia Dx January 03 2015  . Hypertension Dx Apr 2016    Past Surgical History  Procedure Laterality Date  . Tubal ligation  Jul 25, 2000    There were no vitals filed for this visit.  Visit Diagnosis:  Hip pain, left - Plan: PT plan of care cert/re-cert  Hip stiffness, unspecified laterality - Plan: PT plan of care cert/re-cert  Posture abnormality - Plan: PT plan of care cert/re-cert      Subjective Assessment - 01/17/16 1148    Subjective Pt reports to PT with Lt buttock/SI joint pain that began 09/2015.  She started to notice this when crossing the leg to put on shoes.  Pt had x-ray of lumbar spine and hip in December 2016 and this showed SI joint OA/osteophyte.     Limitations Standing;Walking   How long can you stand comfortably? nees to shift weight at work   Diagnostic tests x-ray: Lt SI joint scleroitc osteophyte   Patient Stated Goals reduce Lt buttock/lumbar pain, stand at work with less pain   Currently in Pain? Yes   Pain Score 4   4-6/10   Pain Location Sacrum   Pain Orientation Left   Pain Descriptors / Indicators Aching;Dull   Pain Type Chronic pain   Pain Onset More than a month ago   Pain Frequency Intermittent   Aggravating Factors  standing,  work, sitting in certain seats for too long   Pain Relieving Factors pain medication, sitting down            [facility] PT Assessment - 01/17/16 0001    Assessment   Medical Diagnosis Lateral pain of the Lt hip   Referring Provider Boykin Nearing, MD   Onset Date/Surgical Date 08/19/15   Next MD Visit none scheduled   Prior Therapy none   Precautions   Precautions None   Restrictions   Weight Bearing Restrictions No   Balance Screen   Has the patient fallen in the past 6 months No   Has the patient had a decrease in activity level because of a fear of falling?  No   Is the patient reluctant to leave their home because of a fear of falling?  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   Leisure none   Cognition   Overall Cognitive Status Within Functional Limits for tasks assessed   Observation/Other Assessments   Focus on Therapeutic Outcomes (FOTO)  40% limitation   Posture/Postural Control   Posture/Postural Control Postural limitations   Postural Limitations  Rounded Shoulders;Forward head;Decreased lumbar lordosis;Weight shift right;Posterior pelvic tilt   ROM / Strength   AROM / PROM / Strength AROM;PROM;Strength   AROM   Overall AROM  Deficits   Overall AROM Comments Lumbar AROM: flexion limited by 50%, extension full with Lt lumbar pain, sidebending limited by 25% bil. with Lt lumbar/SI joint pain reported with each.     PROM   Overall PROM  Deficits   Overall PROM Comments Bil hip flexiblity limited 25-50% in all directions without pain   Strength   Overall Strength Within functional limits for tasks performed   Overall Strength Comments 4+/5 to 5/5 bil hip and knee strength.  Pt reported Lt SI joint pain with resisted testing on the Lt.   Palpation   Spinal mobility decreased  mobility with PA mobs along the length of the thoracic and lumbar spine.  Pain reported T10-L5.     Palpation comment tension and reduced muscle length over bilateral lumbar parapinals and deep gluteals   Special Tests    Special Tests Lumbar   Lumbar Tests Slump Test;Straight Leg Raise   Slump test   Findings Negative   Side Left   Straight Leg Raise   Findings Negative   Side  Left   Ambulation/Gait   Ambulation/Gait Yes   Ambulation/Gait Assistance 7: Independent   Ambulation Distance (Feet) 100 Feet   Gait Pattern Within Functional Limits                           PT Education - 01/17/16 1212    Education provided Yes   Education Details lumbar and hip flexiblity   Person(s) Educated Patient   Methods Explanation;Demonstration;Handout   Comprehension Verbalized understanding;Returned demonstration          PT Short Term Goals - 01/17/16 1142    PT SHORT TERM GOAL #1   Title be independent in independent HEP   Time 4   Period Weeks   Status New   PT SHORT TERM GOAL #2   Title report a 25% reduction in Lt SI/hip pain with standing at work   Time 4   Period Weeks   Status New   PT SHORT TERM GOAL #3   Title report < or = to 4/10 Lt hip/SI joint pain at the end of a work shift   Time 4   Period Weeks   Status New   PT SHORT TERM GOAL #4   Title verbalize and demonstrate correct body mechanics techniques to protect lumbar spine at home and work   Time 4   Period Weeks   Status New           PT Long Term Goals - 01/17/16 1225    PT LONG TERM GOAL #1   Title be independent in advanced HEP   Time 8   Period Weeks   Status New   PT LONG TERM GOAL #2   Title reduce FOTO to < or = to 31% limitaiton   Time 8   Period Weeks   Status New   PT LONG TERM GOAL #3   Title report a 60% reduction in Lt SI/hip pain with standing at work   Time 8   Period Weeks   Status New   PT LONG TERM GOAL #4   Title report < or = to 2-3/10 Lt SI/hip pain  with standing at work   Time 8   Period Weeks   Status  New               Plan - 01/17/16 1213    Clinical Impression Statement Pt presents to PT with ~5 month history of Lt gluteal pain that began without cause.  Pt reports 4-6/10 pain with all standing activity,.  Pt demonstrates bil. hip stiffness, painful mobility of the lumbar spine and Lt hip and muscle tension in the bil. lumbar spine and Lt deep gluteals.  Pt will  postural dysfunction with forward head, rounded shoulder and posterior pelvic tilt.  Pt will beneift from skilled PT for hip and lumbar flexiblity, body mechanics education, manual and modalities to reduce muscle tension and pain.    Pt will benefit from skilled therapeutic intervention in order to improve on the following deficits Postural dysfunction;Decreased mobility;Impaired flexibility;Improper body mechanics;Pain;Decreased activity tolerance;Increased muscle spasms   Rehab Potential Good   PT Frequency 2x / week   PT Duration 8 weeks   PT Treatment/Interventions ADLs/Self Care Home Management;Cryotherapy;Electrical Stimulation;Iontophoresis 35m/ml Dexamethasone;Moist Heat;Therapeutic exercise;Therapeutic activities;Functional mobility training;Ultrasound;Neuromuscular re-education;Patient/family education;Manual techniques;Taping;Dry needling;Passive range of motion   PT Next Visit Plan body mechanics education, hip and lumbar flexiblity, core strength.  Manual and modalities as needed.  Pt might only be able to come 1x/wk-build HEP   Consulted and Agree with Plan of Care Patient         Problem List Patient Active Problem List   Diagnosis Date Noted  . Lateral pain of left hip 10/25/2015  . Dizziness 06/12/2015  . Heel pain, bilateral 05/03/2015  . Obesity (BMI 30-39.9) 05/03/2015  . Hearing loss in right ear 03/01/2015  . Family history of diabetes mellitus in mother 03/01/2015  . Essential hypertension 01/18/2015  . Anemia, iron deficiency 01/04/2015   . Musculoskeletal chest pain 01/04/2015  . Thrombocytopenia (HEsbon 01/04/2015  . Symptomatic anemia 01/03/2015  . Transaminitis 01/03/2015    KSigurd Sos PT 01/17/2016 12:28 PM  [facility] [address] [place] [address]Phone: [phone]  Fax:  [phone] Name: MDAMIKA HARMONMRN: [id number] of Birth: 103-Mar-1977

## 2016-01-24 ENCOUNTER — Ambulatory Visit: Payer: BLUE CROSS/BLUE SHIELD | Admitting: Physical Therapy

## 2016-01-24 ENCOUNTER — Encounter: Payer: Self-pay | Admitting: Physical Therapy

## 2016-01-24 DIAGNOSIS — M25659 Stiffness of unspecified hip, not elsewhere classified: Secondary | ICD-10-CM

## 2016-01-24 DIAGNOSIS — R293 Abnormal posture: Secondary | ICD-10-CM

## 2016-01-24 DIAGNOSIS — M25552 Pain in left hip: Secondary | ICD-10-CM

## 2016-01-24 NOTE — Patient Instructions (Signed)
Sleeping on Back  Place pillow under knees. A pillow with cervical support and a roll around waist are also helpful. Copyright  VHI. All rights reserved.  Sleeping on Side Place pillow between knees. Use cervical support under neck and a roll around waist as needed. Copyright  VHI. All rights reserved.   Sleeping on Stomach   If this is the only desirable sleeping position, place pillow under lower legs, and under stomach or chest as needed.  Posture - Sitting   Sit upright, head facing forward. Try using a roll to support lower back. Keep shoulders relaxed, and avoid rounded back. Keep hips level with knees. Avoid crossing legs for long periods. Stand to Sit / Sit to Stand   To sit: Bend knees to lower self onto front edge of chair, then scoot back on seat. To stand: Reverse sequence by placing one foot forward, and scoot to front of seat. Use rocking motion to stand up.   Work Height and Reach  Ideal work height is no more than 2 to 4 inches below elbow level when standing, and at elbow level when sitting. Reaching should be limited to arm's length, with elbows slightly bent.  Bending  Bend at hips and knees, not back. Keep feet shoulder-width apart.    Posture - Standing   Good posture is important. Avoid slouching and forward head thrust. Maintain curve in low back and align ears over shoul- ders, hips over ankles.  Alternating Positions   Alternate tasks and change positions frequently to reduce fatigue and muscle tension. Take rest breaks. Computer Work   Position work to face forward. Use proper work and seat height. Keep shoulders back and down, wrists straight, and elbows at right angles. Use chair that provides full back support. Add footrest and lumbar roll as needed.  Getting Into / Out of Car  Lower self onto seat, scoot back, then bring in one leg at a time. Reverse sequence to get out.  Dressing  Lie on back to pull socks or slacks over feet, or sit  and bend leg while keeping back straight.    Housework - Sink  Place one foot on ledge of cabinet under sink when standing at sink for prolonged periods.   Pushing / Pulling  Pushing is preferable to pulling. Keep back in proper alignment, and use leg muscles to do the work.  Deep Squat   Squat and lift with both arms held against upper trunk. Tighten stomach muscles without holding breath. Use smooth movements to avoid jerking.  Avoid Twisting   Avoid twisting or bending back. Pivot around using foot movements, and bend at knees if needed when reaching for articles.  Carrying Luggage   Distribute weight evenly on both sides. Use a cart whenever possible. Do not twist trunk. Move body as a unit.   Lifting Principles .Maintain proper posture and head alignment. .Slide object as close as possible before lifting. .Move obstacles out of the way. .Test before lifting; ask for help if too heavy. .Tighten stomach muscles without holding breath. .Use smooth movements; do not jerk. .Use legs to do the work, and pivot with feet. .Distribute the work load symmetrically and close to the center of trunk. .Push instead of pull whenever possible.   Ask For Help   Ask for help and delegate to others when possible. Coordinate your movements when lifting together, and maintain the low back curve.  Log Roll   Lying on back, bend left knee and place left   arm across chest. Roll all in one movement to the right. Reverse to roll to the left. Always move as one unit. Housework - Sweeping  Use long-handled equipment to avoid stooping.   Housework - Wiping  Position yourself as close as possible to reach work surface. Avoid straining your back.  Laundry - Unloading Wash   To unload small items at bottom of washer, lift leg opposite to arm being used to reach.  Gardening - Raking  Move close to area to be raked. Use arm movements to do the work. Keep back straight and avoid  twisting.  Lower abdominal/core stability exercises  1. Practice your breathing technique: Inhale through your nose expanding your belly and rib cage. Try not to breathe into your chest. Exhale slowly and gradually out your mouth feeling a sense of softness to your body. Practice multiple times. This can be performed unlimited.  2. Finding the lower abdominals. Laying on your back with the knees bent, place your fingers just below your belly button. Using your breathing technique from above, on your exhale gently pull the belly button away from your fingertips without tensing any other muscles. Practice this 5x. Next, as you exhale, draw belly button inwards and hold onto it...then feel as if you are pulling that muscle across your pelvis like you are tightening a belt. This can be hard to do at first so be patient and practice. Do 5-10 reps 1-3 x day. Always recognize quality over quantity; if your abdominal muscles become tired you will notice you may tighten/contract other muscles. This is the time to take a break.   Practice this first laying on your back, then in sitting, progressing to standing and finally adding it to all your daily movements.   3. Finding your pelvic floor. Using the breathing technique above, when your exhale, this time draw your pelvic floor muscles up as if you were attempting to stop the flow of urination. Be careful NOT to tense any other muscles. This can be hard, BE PATIENT. Try to hold up to 10 seconds repeating 10x. Try 2x a day. Once you feel you are doing this well, add this contraction to exercise #2. First contracting your pelvic floor followed by lower abdominals.  4. Adding leg movements. Add the following leg movements to challenge your ability to keep your core stable:  1. Single leg drop outs: Laying on your back with knees bent feet flat. Inhale,  dropping one knee outward KEEPING YOUR PELVIS STILL. Exhale as you bring the leg back, simultaneously performing  your lower abdominal contraction. Do 5-10 on each leg.  2. Marching: While keeping your pelvis still, lift the right foot a few inches, put it down then lift left foot. This will mimic a march. Start slow to establish control. Once you have control you may speed it up. Do 10-20x. You MUST keep your lower abdominlas contracted while you march. Breathe naturally   3. Single leg slides: Inhale while you slowly slide one leg out keeping your pelvis still. Only slide your leg as far as you can keep your pelvis still. Exhale as you bring the leg back to the start, contracting the lower abdominals as you do that. Keep your upper body relaxed. Do 5-10 on each side.   Cart  When reaching into cart with one arm, lift opposite leg to keep back straight.   Getting Into / Out of Bed  Lower self to lie down on one side by raising legs and  lowering head at the same time. Use arms to assist moving without twisting. Bend both knees to roll onto back if desired. To sit up, start from lying on side, and use same move-ments in reverse. Housework - Vacuuming  Hold the vacuum with arm held at side. Step back and forth to move it, keeping head up. Avoid twisting.   Laundry - Armed forces training and education officer so that bending and twisting can be avoided.   Laundry - Unloading Dryer  Squat down to reach into clothes dryer or use a reacher.  Gardening - Weeding / Psychiatric nurse or Kneel. Knee pads may be helpful.

## 2016-01-24 NOTE — Therapy (Signed)
[facility] [address], [address] [place], [place], [address] Phone: [phone]   Fax:  [phone]  Physical Therapy Treatment  Patient Details  Name: Angela Castillo MRN: [id number] Date of Birth: [birth year] Referring Provider: Dessa Phi, MD  Encounter Date: 01/24/2016      PT End of Session - 01/24/16 1158    Visit Number 2   Date for PT Re-Evaluation 03/13/16   PT Start Time 1148   PT Stop Time 1230   PT Time Calculation (min) 42 min   Activity Tolerance Patient tolerated treatment well   Behavior During Therapy [facility] for tasks assessed/performed      Past Medical History  Diagnosis Date  . Reflux   . Elevated liver enzymes   . Anemia Dx January 03 2015  . Hypertension Dx Apr 2016    Past Surgical History  Procedure Laterality Date  . Tubal ligation  Jul 25, 2000    There were no vitals filed for this visit.  Visit Diagnosis:  Hip pain, left  Hip stiffness, unspecified laterality  Posture abnormality      Subjective Assessment - 01/24/16 1152    Subjective Constant pain   Currently in Pain? Yes   Pain Location Sacrum   Pain Orientation Left   Pain Descriptors / Indicators Shooting;Throbbing;Constant   Aggravating Factors  Crossing the leg/tie shoes position   Pain Relieving Factors Pain meds, laying down   Multiple Pain Sites No                         [facility] Adult PT Treatment/Exercise - 01/24/16 0001    Self-Care   Self-Care ADL's;Heat/Ice Application;Other Self-Care Comments   ADL's Laying flat to decompress spine after work, not sitting too long, using a donut cushion when sitting   Heat/Ice Application when to use heat   Other Self-Care Comments  Shitfing weight at work, sleeping positions with pillows   Lumbar Exercises: Stretches   Single Knee to Chest Stretch 3 reps;20 seconds   Lower Trunk Rotation 3 reps;20 seconds   Lumbar Exercises: Aerobic   Stationary  Bike Nustep L1 x 5 min                PT Education - 01/24/16 1209    Education provided Yes   Education Details Self care tips, TA contraction, ADL body mechanics   Person(s) Educated Patient   Methods Explanation;Demonstration;Tactile cues;Verbal cues;Handout   Comprehension Verbalized understanding;Returned demonstration          PT Short Term Goals - 01/24/16 1208    PT SHORT TERM GOAL #1   Title be independent in independent HEP   Time 4   Period Weeks   Status Achieved   PT SHORT TERM GOAL #2   Title report a 25% reduction in Lt SI/hip pain with standing at work   Time 4   Period Weeks   Status On-going  Too early   PT SHORT TERM GOAL #3   Title report < or = to 4/10 Lt hip/SI joint pain at the end of a work shift   Time 4   Period Weeks   Status On-going           PT Long Term Goals - 01/17/16 1225    PT LONG TERM GOAL #1   Title be independent in advanced HEP   Time 8   Period Weeks   Status New   PT LONG TERM  GOAL #2   Title reduce FOTO to < or = to 31% limitaiton   Time 8   Period Weeks   Status New   PT LONG TERM GOAL #3   Title report a 60% reduction in Lt SI/hip pain with standing at work   Time 8   Period Weeks   Status New   PT LONG TERM GOAL #4   Title report < or = to 2-3/10 Lt SI/hip pain with standing at work   Time 8   Period Weeks   Status New               Plan - 01/24/16 1218    Clinical Impression Statement Pt was educated on self care tips to help reduce the pain inher hip & leg. Began lower ab training for HEP and conditioning on teh Nustep. pt is compliant and independent in initial HEP.    Pt will benefit from skilled therapeutic intervention in order to improve on the following deficits Postural dysfunction;Decreased mobility;Impaired flexibility;Improper body mechanics;Pain;Decreased activity tolerance;Increased muscle spasms   Rehab Potential Good   PT Frequency 2x / week   PT Duration 8 weeks   PT  Treatment/Interventions ADLs/Self Care Home Management;Cryotherapy;Electrical Stimulation;Iontophoresis 4mg /ml Dexamethasone;Moist Heat;Therapeutic exercise;Therapeutic activities;Functional mobility training;Ultrasound;Neuromuscular re-education;Patient/family education;Manual techniques;Taping;Dry needling;Passive range of motion   PT Next Visit Plan Review TA exercises, then add leg movements to HEP   Consulted and Agree with Plan of Care Patient        Problem List Patient Active Problem List   Diagnosis Date Noted  . Lateral pain of left hip 10/25/2015  . Dizziness 06/12/2015  . Heel pain, bilateral 05/03/2015  . Obesity (BMI 30-39.9) 05/03/2015  . Hearing loss in right ear 03/01/2015  . Family history of diabetes mellitus in mother 03/01/2015  . Essential hypertension 01/18/2015  . Anemia, iron deficiency 01/04/2015  . Musculoskeletal chest pain 01/04/2015  . Thrombocytopenia (HCC) 01/04/2015  . Symptomatic anemia 01/03/2015  . Transaminitis 01/03/2015    Tamar Miano, PTA 01/24/2016, 12:24 PM  [facility] [address], [address] [place], [place], [address] Phone: [phone]   Fax:  [phone]  Name: Angela Castillo MRN: [id number] Date of Birth: 1975-11-16

## 2016-01-31 ENCOUNTER — Ambulatory Visit: Payer: BLUE CROSS/BLUE SHIELD | Admitting: Physical Therapy

## 2016-01-31 ENCOUNTER — Encounter: Payer: Self-pay | Admitting: Physical Therapy

## 2016-01-31 DIAGNOSIS — M25659 Stiffness of unspecified hip, not elsewhere classified: Secondary | ICD-10-CM

## 2016-01-31 DIAGNOSIS — M25552 Pain in left hip: Secondary | ICD-10-CM

## 2016-01-31 DIAGNOSIS — R293 Abnormal posture: Secondary | ICD-10-CM

## 2016-01-31 MED FILL — LOSARTAN POTASSIUM 100 MG T: 100 | 30 days supply | Qty: 30 | Fill #3

## 2016-01-31 NOTE — Therapy (Signed)
[facility] [address], [address] [place], [place], [address] Phone: [phone]   Fax:  [phone]  Physical Therapy Treatment  Patient Details  Name: NOEMI BELLISSIMO MRN: [id number] Date of Birth: [birth year] Referring Provider: Dessa Phi, MD  Encounter Date: 01/31/2016      PT End of Session - 01/31/16 1150    Visit Number 3   Date for PT Re-Evaluation 03/13/16   PT Start Time 1144   PT Stop Time 1234   PT Time Calculation (min) 50 min   Activity Tolerance Patient tolerated treatment well   Behavior During Therapy [facility] for tasks assessed/performed      Past Medical History  Diagnosis Date  . Reflux   . Elevated liver enzymes   . Anemia Dx January 03 2015  . Hypertension Dx Apr 2016    Past Surgical History  Procedure Laterality Date  . Tubal ligation  Jul 25, 2000    There were no vitals filed for this visit.  Visit Diagnosis:  Hip pain, left  Hip stiffness, unspecified laterality  Posture abnormality      Subjective Assessment - 01/31/16 1146    Subjective Pain in left hip, low back and left shoulder area   Limitations Standing;Walking   How long can you stand comfortably? nees to shift weight at work   Diagnostic tests x-ray: Lt SI joint scleroitc osteophyte   Patient Stated Goals reduce Lt buttock/lumbar pain, stand at work with less pain   Currently in Pain? Yes   Pain Score 2    Pain Location Hip   Pain Orientation Left   Pain Descriptors / Indicators Constant;Shooting;Throbbing   Pain Type Chronic pain   Pain Onset More than a month ago   Pain Frequency Intermittent   Aggravating Factors  Crossing the legs/tie shoies   Pain Relieving Factors pain meds, laying down   Multiple Pain Sites Yes   Pain Score 2   Pain Location Back   Pain Orientation Left   Pain Descriptors / Indicators Aching;Sharp   Pain Type Chronic pain   Pain Onset More than a month ago   Pain Frequency  Constant                         [facility] Adult PT Treatment/Exercise - 01/31/16 0001    Posture/Postural Control   Posture/Postural Control Postural limitations   Postural Limitations Rounded Shoulders;Forward head;Decreased lumbar lordosis;Weight shift right;Posterior pelvic tilt   Lumbar Exercises: Stretches   Single Knee to Chest Stretch 3 reps;20 seconds   Lower Trunk Rotation 3 reps;20 seconds   Lumbar Exercises: Aerobic   Stationary Bike Nustep L1 x 6 min   Lumbar Exercises: Standing   Row Strengthening;Both  3 x 10 with TA activation   Other Standing Lumbar Exercises sitting abdominals with yellow plyoball diagonal 3 x 10  pt tolerated well   Lumbar Exercises: Supine   Other Supine Lumbar Exercises TA activation with leg movement x 3 each leg                PT Education - 01/31/16 1207    Education provided Yes   Education Details TA activation with leg movement   Person(s) Educated Patient   Methods Explanation;Demonstration;Verbal cues;Handout   Comprehension Verbalized understanding;Returned demonstration          PT Short Term Goals - 01/31/16 1200    PT SHORT TERM GOAL #1   Title be independent in  independent HEP   Time 4   Period Weeks   Status Achieved   PT SHORT TERM GOAL #2   Title report a 25% reduction in Lt SI/hip pain with standing at work   Time 4   Period Weeks   Status On-going   PT SHORT TERM GOAL #3   Title report < or = to 4/10 Lt hip/SI joint pain at the end of a work shift   Time 4   Period Weeks   Status On-going   PT SHORT TERM GOAL #4   Title verbalize and demonstrate correct body mechanics techniques to protect lumbar spine at home and work   Time 4   Period Weeks   Status On-going           PT Long Term Goals - 01/17/16 1225    PT LONG TERM GOAL #1   Title be independent in advanced HEP   Time 8   Period Weeks   Status New   PT LONG TERM GOAL #2   Title reduce FOTO to < or = to 31% limitaiton    Time 8   Period Weeks   Status New   PT LONG TERM GOAL #3   Title report a 60% reduction in Lt SI/hip pain with standing at work   Time 8   Period Weeks   Status New   PT LONG TERM GOAL #4   Title report < or = to 2-3/10 Lt SI/hip pain with standing at work   Time 8   Period Weeks   Status New               Plan - 01/31/16 1152    Clinical Impression Statement Pt with weak abdominals all over decreased strength and endurance. Pt able to tolerate gentle strengthening and stretching exercises. Pt will continue to benefit from skilled PT to advance core strength, endurance and flexibility.   Pt will benefit from skilled therapeutic intervention in order to improve on the following deficits Postural dysfunction;Decreased mobility;Impaired flexibility;Improper body mechanics;Pain;Decreased activity tolerance;Increased muscle spasms   Rehab Potential Good   PT Frequency 2x / week   PT Duration 8 weeks   PT Treatment/Interventions ADLs/Self Care Home Management;Cryotherapy;Electrical Stimulation;Iontophoresis 4mg /ml Dexamethasone;Moist Heat;Therapeutic exercise;Therapeutic activities;Functional mobility training;Ultrasound;Neuromuscular re-education;Patient/family education;Manual techniques;Taping;Dry needling;Passive range of motion   PT Next Visit Plan Continue with TA activation with activities, overall strength and stretching. Review TA exercises, then add leg movements to HEP   Consulted and Agree with Plan of Care Patient        Problem List Patient Active Problem List   Diagnosis Date Noted  . Lateral pain of left hip 10/25/2015  . Dizziness 06/12/2015  . Heel pain, bilateral 05/03/2015  . Obesity (BMI 30-39.9) 05/03/2015  . Hearing loss in right ear 03/01/2015  . Family history of diabetes mellitus in mother 03/01/2015  . Essential hypertension 01/18/2015  . Anemia, iron deficiency 01/04/2015  . Musculoskeletal chest pain 01/04/2015  . Thrombocytopenia (HCC)  01/04/2015  . Symptomatic anemia 01/03/2015  . Transaminitis 01/03/2015    NAUMANN-HOUEGNIFIO,Porter Moes PTA 01/31/2016, 1:18 PM  [facility] [address], [address] [place], [place], [address] Phone: [phone]   Fax:  [phone]  Name: Tamala FothergillMichelle D Koegel MRN: [id number] Date of Birth: 16-Jun-1976

## 2016-01-31 NOTE — Patient Instructions (Addendum)

## 2016-02-01 ENCOUNTER — Emergency Department (HOSPITAL_BASED_OUTPATIENT_CLINIC_OR_DEPARTMENT_OTHER)
Admission: EM | Admit: 2016-02-01 | Discharge: 2016-02-01 | Disposition: A | Discharge status: Home / Self Care | Payer: Worker's Compensation | Attending: Emergency Medicine | Admitting: Emergency Medicine

## 2016-02-01 ENCOUNTER — Encounter (HOSPITAL_BASED_OUTPATIENT_CLINIC_OR_DEPARTMENT_OTHER): Payer: Self-pay

## 2016-02-01 DIAGNOSIS — Y9289 Other specified places as the place of occurrence of the external cause: Secondary | ICD-10-CM | POA: Insufficient documentation

## 2016-02-01 DIAGNOSIS — D649 Anemia, unspecified: Secondary | ICD-10-CM | POA: Insufficient documentation

## 2016-02-01 DIAGNOSIS — X100XXA Contact with hot drinks, initial encounter: Secondary | ICD-10-CM | POA: Diagnosis not present

## 2016-02-01 DIAGNOSIS — Z79899 Other long term (current) drug therapy: Secondary | ICD-10-CM | POA: Diagnosis not present

## 2016-02-01 DIAGNOSIS — Z8719 Personal history of other diseases of the digestive system: Secondary | ICD-10-CM | POA: Diagnosis not present

## 2016-02-01 DIAGNOSIS — Y998 Other external cause status: Secondary | ICD-10-CM | POA: Insufficient documentation

## 2016-02-01 DIAGNOSIS — Y9389 Activity, other specified: Secondary | ICD-10-CM | POA: Diagnosis not present

## 2016-02-01 DIAGNOSIS — I10 Essential (primary) hypertension: Secondary | ICD-10-CM | POA: Insufficient documentation

## 2016-02-01 DIAGNOSIS — S99922A Unspecified injury of left foot, initial encounter: Secondary | ICD-10-CM | POA: Diagnosis present

## 2016-02-01 DIAGNOSIS — T25122A Burn of first degree of left foot, initial encounter: Secondary | ICD-10-CM

## 2016-02-01 MED ORDER — NAPROXEN 500 MG PO TABS: 500.0000 mg | ORAL_TABLET | Freq: Two times a day (BID) | ORAL | Status: DC

## 2016-02-01 MED FILL — NAPROXEN 500 MG TABLET: 500 | 7 days supply | Qty: 14 | Fill #0

## 2016-02-01 NOTE — ED Notes (Signed)
Pt states hot coffee spilled on her left foot at work this am-pt had sneaker and a sock on foot-no redness noted to foot or toes-NAD

## 2016-02-01 NOTE — Discharge Instructions (Signed)
Recommend Naprosyn as directed.Marland Kitchen Be first-degree burns to the foot would expect him to heal. Also recommend aloe vera. Make an appointment with your regular doctor if things are not improving in 2 days. Work note provided for today.

## 2016-02-02 NOTE — ED Provider Notes (Signed)
CSN: [id number]     Arrival date & time 02/01/16  1107 History   First MD Initiated Contact with Patient 02/01/16 1109     Chief Complaint  Patient presents with  . Foot Injury     (Consider location/radiation/quality/duration/timing/severity/associated sxs/prior Treatment) Patient is a 40 y.o. female presenting with foot injury. The history is provided by the patient.  Foot Injury Associated symptoms: no fever    Patient works at [employer]. Coffee spilled on her left foot down into the shoe. Patient with complaint of redness and some pain to the lateral ankle area and the fourth and fifth toes. Patient's tetanus is up-to-date.  No other injuries. Past Medical History  Diagnosis Date  . Reflux   . Elevated liver enzymes   . Anemia Dx January 03 2015  . Hypertension Dx Apr 2016   Past Surgical History  Procedure Laterality Date  . Tubal ligation  Jul 25, 2000   Family History  Problem Relation Age of Onset  . COPD Mother   . Esophageal cancer Mother   . Diabetes Mother   . Cancer Mother   . Hypertension Mother   . Diabetes Father    Social History  Substance Use Topics  . Smoking status: Never Smoker   . Smokeless tobacco: None  . Alcohol Use: Yes     Comment: rarely   OB History    No data available     Review of Systems  Constitutional: Negative for fever.  HENT: Negative for congestion.   Eyes: Negative for redness.  Respiratory: Negative for shortness of breath.   Cardiovascular: Negative for chest pain.  Gastrointestinal: Negative for abdominal pain.  Genitourinary: Negative for dysuria.  Skin: Negative for rash.  Hematological: Does not bruise/bleed easily.  Psychiatric/Behavioral: Negative for confusion.      Allergies  Review of patient's allergies indicates no known allergies.  Home Medications   Prior to Admission medications   Medication Sig Start Date End Date Taking? Authorizing Provider  diclofenac (VOLTAREN) 75 MG EC tablet Take 1  tablet (75 mg total) by mouth 2 (two) times daily. Patient not taking: Reported on 01/17/2016 10/25/15   Boykin Nearing, MD  ferrous sulfate 325 (65 FE) MG tablet Take 325 mg by mouth 2 (two) times daily with a meal.    Historical Provider, MD  fluticasone (FLONASE) 50 MCG/ACT nasal spray Place 2 sprays into both nostrils daily. Patient taking differently: Place 2 sprays into both nostrils daily as needed for allergies or rhinitis.  03/01/15   Josalyn Funches, MD  losartan (COZAAR) 100 MG tablet Take 1 tablet (100 mg total) by mouth daily. 10/25/15   Josalyn Funches, MD  methocarbamol (ROBAXIN) 500 MG tablet Take 1 tablet (500 mg total) by mouth 2 (two) times daily. Patient not taking: Reported on 01/17/2016 11/06/15   [facility], PA-C  Multiple Vitamins-Minerals (HAIR SKIN AND NAILS FORMULA PO) Take 2 each by mouth every morning. gummies    Historical Provider, MD  naproxen (NAPROSYN) 500 MG tablet Take 1 tablet (500 mg total) by mouth 2 (two) times daily. 02/01/16   Fredia Sorrow, MD  traMADol (ULTRAM) 50 MG tablet Take 1 tablet (50 mg total) by mouth every 8 (eight) hours as needed. 12/26/15   Josalyn Funches, MD   BP 140/90 mmHg  Pulse 89  Temp(Src) 98.7 F (37.1 C) (Oral)  Resp 18  Ht 5' 4"  (1.626 m)  Wt 105.235 kg  BMI 39.80 kg/m2  SpO2 98%  LMP 01/11/2016 Physical  Exam  Constitutional: She is oriented to person, place, and time. She appears well-developed and well-nourished. No distress.  HENT:  Head: Normocephalic and atraumatic.  Eyes: Conjunctivae and EOM are normal. Pupils are equal, round, and reactive to light.  Neck: Normal range of motion. Neck supple.  Cardiovascular: Normal rate and regular rhythm.   No murmur heard. Pulmonary/Chest: Effort normal and breath sounds normal. No respiratory distress.  Abdominal: Soft. Bowel sounds are normal. There is no tenderness.  Musculoskeletal: Normal range of motion. She exhibits tenderness.  Normal except for 1 cm area of  redness without blistering to the lateral aspect of the left ankle heel area. Also redness to both fourth and fifth toes. No blistering. Refill intact. Dorsalis pedis pulses 2+. Sensation intact.  Neurological: She is alert and oriented to person, place, and time. No cranial nerve deficit. She exhibits normal muscle tone. Coordination normal.  Skin: Skin is warm.  Nursing note and vitals reviewed.   ED Course  Procedures (including critical care time) Labs Review Labs Reviewed - No data to display  Imaging Review No results found. I have personally reviewed and evaluated these images and lab results as part of my medical decision-making.   EKG Interpretation None      MDM   Final diagnoses:  Left foot burn, first degree, initial encounter    Patient with 2 small areas of first-degree burns to the left foot 1 to the lateral aspect of the heel and the other to the area of the base of the fourth and fifth toes. No blistering. Her sounds. Pulses intact. Will treat with anti-inflammatory and Aloe Vera.    Fredia Sorrow, MD 02/02/16 [phone]

## 2016-02-07 ENCOUNTER — Encounter: Payer: BLUE CROSS/BLUE SHIELD | Admitting: Physical Therapy

## 2016-02-14 ENCOUNTER — Encounter: Payer: Self-pay | Admitting: Physical Therapy

## 2016-02-14 ENCOUNTER — Ambulatory Visit: Payer: BLUE CROSS/BLUE SHIELD | Attending: Family Medicine | Admitting: Physical Therapy

## 2016-02-14 DIAGNOSIS — M25652 Stiffness of left hip, not elsewhere classified: Secondary | ICD-10-CM

## 2016-02-14 DIAGNOSIS — M25552 Pain in left hip: Secondary | ICD-10-CM | POA: Insufficient documentation

## 2016-02-14 DIAGNOSIS — R293 Abnormal posture: Secondary | ICD-10-CM | POA: Diagnosis present

## 2016-02-14 NOTE — Therapy (Signed)
[facility] [address] [place], [place], [address] Phone: [phone]   Fax:  [phone]  Physical Therapy Treatment  Patient Details  Name: Angela Castillo MRN: [id number] Date of Birth: Aug 10, 1976 Referring Provider: Dessa Phi, MD  Encounter Date: 02/14/2016      PT End of Session - 02/14/16 1208    Visit Number 4   Date for PT Re-Evaluation 03/13/16   PT Start Time 1143   PT Stop Time 1230   PT Time Calculation (min) 47 min   Activity Tolerance Patient tolerated treatment well   Behavior During Therapy [facility] for tasks assessed/performed      Past Medical History  Diagnosis Date  . Reflux   . Elevated liver enzymes   . Anemia Dx January 03 2015  . Hypertension Dx Apr 2016    Past Surgical History  Procedure Laterality Date  . Tubal ligation  Jul 25, 2000    There were no vitals filed for this visit.      Subjective Assessment - 02/14/16 1159    Subjective Pt reports pain in left leg, low back and shoulder is less frequent since start of PT.    Limitations Standing;Walking   How long can you stand comfortably? nees to shift weight at work   Diagnostic tests x-ray: Lt SI joint scleroitc osteophyte   Patient Stated Goals reduce Lt buttock/lumbar pain, stand at work with less pain   Currently in Pain? Yes   Pain Score 1    Pain Location Hip   Pain Orientation Left   Pain Descriptors / Indicators Shooting;Throbbing   Pain Type Chronic pain   Pain Onset More than a month ago   Pain Frequency Intermittent   Aggravating Factors  crossing the legs/tie shoes   Pain Relieving Factors pain meds, laying down   Multiple Pain Sites Yes   Pain Score 1   Pain Location Back   Pain Orientation Left   Pain Descriptors / Indicators Aching;Sharp   Pain Type Chronic pain   Pain Onset More than a month ago   Pain Frequency Constant                         [facility] Adult PT  Treatment/Exercise - 02/14/16 0001    Posture/Postural Control   Posture/Postural Control Postural limitations   Postural Limitations Rounded Shoulders;Forward head;Decreased lumbar lordosis;Weight shift right;Posterior pelvic tilt   Lumbar Exercises: Stretches   Single Knee to Chest Stretch 3 reps;20 seconds   Double Knee to Chest Stretch 3 reps;20 seconds   Lower Trunk Rotation 3 reps;20 seconds   Lumbar Exercises: Aerobic   Stationary Bike Nustep L1 x 10 min   Lumbar Exercises: Standing   Row Strengthening;Both;20 reps  red t-band, with TA activation   Other Standing Lumbar Exercises --   Lumbar Exercises: Supine   Other Supine Lumbar Exercises TA activation with leg movement x 5 each leg                  PT Short Term Goals - 02/14/16 1211    PT SHORT TERM GOAL #1   Title be independent in independent HEP   Time 4   Period Weeks   Status Achieved   PT SHORT TERM GOAL #2   Title report a 25% reduction in Lt SI/hip pain with standing at work   Time 4   Period Weeks   Status On-going   PT SHORT  TERM GOAL #3   Title report < or = to 4/10 Lt hip/SI joint pain at the end of a work shift   Time 4   Period Weeks   Status On-going   PT SHORT TERM GOAL #4   Title verbalize and demonstrate correct body mechanics techniques to protect lumbar spine at home and work   Time 4   Period Weeks   Status On-going           PT Long Term Goals - 01/17/16 1225    PT LONG TERM GOAL #1   Title be independent in advanced HEP   Time 8   Period Weeks   Status New   PT LONG TERM GOAL #2   Title reduce FOTO to < or = to 31% limitaiton   Time 8   Period Weeks   Status New   PT LONG TERM GOAL #3   Title report a 60% reduction in Lt SI/hip pain with standing at work   Time 8   Period Weeks   Status New   PT LONG TERM GOAL #4   Title report < or = to 2-3/10 Lt SI/hip pain with standing at work   Time 8   Period Weeks   Status New               Plan - 02/14/16  1208    Clinical Impression Statement Pt with weak abdominals and general muscle weakness in bil LE Lt>Rt. Pt limited in her standing and walking duration as noticed at work, constantly needs to adjust. Pt will continue to benefit from skilled PT to address those limitation.    Rehab Potential Good   PT Frequency 2x / week   PT Duration 8 weeks   PT Treatment/Interventions ADLs/Self Care Home Management;Cryotherapy;Electrical Stimulation;Iontophoresis 4mg /ml Dexamethasone;Moist Heat;Therapeutic exercise;Therapeutic activities;Functional mobility training;Ultrasound;Neuromuscular re-education;Patient/family education;Manual techniques;Taping;Dry needling;Passive range of motion   PT Next Visit Plan Continue with TA activation with activities, overall strength and stretching. Review TA exercises, then add leg movements to HEP   Consulted and Agree with Plan of Care Patient      Patient will benefit from skilled therapeutic intervention in order to improve the following deficits and impairments:  Postural dysfunction, Decreased mobility, Impaired flexibility, Improper body mechanics, Pain, Decreased activity tolerance, Increased muscle spasms  Visit Diagnosis: Hip pain, left  Posture abnormality  Stiffness of left hip, not elsewhere classified     Problem List Patient Active Problem List   Diagnosis Date Noted  . Lateral pain of left hip 10/25/2015  . Dizziness 06/12/2015  . Heel pain, bilateral 05/03/2015  . Obesity (BMI 30-39.9) 05/03/2015  . Hearing loss in right ear 03/01/2015  . Family history of diabetes mellitus in mother 03/01/2015  . Essential hypertension 01/18/2015  . Anemia, iron deficiency 01/04/2015  . Musculoskeletal chest pain 01/04/2015  . Thrombocytopenia (HCC) 01/04/2015  . Symptomatic anemia 01/03/2015  . Transaminitis 01/03/2015    NAUMANN-HOUEGNIFIO,Angela Castillo PTA 02/14/2016, 12:25 PM  [facility] [address], [address] [place], [place], [address] Phone: [phone]   Fax:  [phone]  Name: Angela FothergillMichelle D Castillo MRN: [id number] Date of Birth: 02-09-1976

## 2016-02-21 ENCOUNTER — Ambulatory Visit (INDEPENDENT_AMBULATORY_CARE_PROVIDER_SITE_OTHER): Payer: BLUE CROSS/BLUE SHIELD | Admitting: Podiatry

## 2016-02-21 ENCOUNTER — Encounter: Payer: Self-pay | Admitting: Podiatry

## 2016-02-21 ENCOUNTER — Ambulatory Visit: Payer: BLUE CROSS/BLUE SHIELD

## 2016-02-21 VITALS — BP 124/80 | HR 90 | Resp 12

## 2016-02-21 DIAGNOSIS — M722 Plantar fascial fibromatosis: Secondary | ICD-10-CM

## 2016-02-21 DIAGNOSIS — M25552 Pain in left hip: Secondary | ICD-10-CM | POA: Diagnosis not present

## 2016-02-21 DIAGNOSIS — M25652 Stiffness of left hip, not elsewhere classified: Secondary | ICD-10-CM

## 2016-02-21 DIAGNOSIS — R293 Abnormal posture: Secondary | ICD-10-CM

## 2016-02-21 MED ORDER — TRIAMCINOLONE ACETONIDE 10 MG/ML IJ SUSP
10.0000 mg | Freq: Once | INTRAMUSCULAR | Status: AC
  Administered 2016-02-21: 10 mg

## 2016-02-21 NOTE — Therapy (Signed)
[facility] [address], [address] [place], [place], [address] Phone: [phone]   Fax:  [phone]  Physical Therapy Treatment  Patient Details  Name: Angela Castillo MRN: [id number] Date of Birth: [birth year] Referring Provider: Dessa PhiFunches, Josalyn, MD  Encounter Date: 02/21/2016      PT End of Session - 02/21/16 1228    Visit Number 5   Date for PT Re-Evaluation 03/13/16   PT Start Time 1147   PT Stop Time 1228   PT Time Calculation (min) 41 min   Activity Tolerance Patient tolerated treatment well   Behavior [facility] for tasks assessed/performed      Past Medical History  Diagnosis Date  . Reflux   . Elevated liver enzymes   . Anemia Dx January 03 2015  . Hypertension Dx Apr 2016    Past Surgical History  Procedure Laterality Date  . Tubal ligation  Jul 25, 2000    There were no vitals filed for this visit.      Subjective Assessment - 02/21/16 1152    Subjective Things are feeling "so so" today.     Patient Stated Goals reduce Lt buttock/lumbar pain, stand at work with less pain   Currently in Pain? Yes   Pain Score 1    Pain Location Hip   Pain Orientation Left   Pain Descriptors / Indicators Shooting;Throbbing   Pain Type Chronic pain   Pain Radiating Towards low back (3/10 pain)   Pain Onset More than a month ago   Pain Frequency Intermittent   Aggravating Factors  standing, activity   Pain Relieving Factors pain meds, shifting weight from side to side, sitting down                         [facility] Adult PT Treatment/Exercise - 02/21/16 0001    Lumbar Exercises: Aerobic   Stationary Bike Nustep L1 x 8 min   Lumbar Exercises: Standing   Row Strengthening;Both;20 reps  red t-band, with TA activation   Other Standing Lumbar Exercises sitting abdominals with yellow plyoball diagonal 3 x 10  pt tolerated well   Lumbar Exercises: Supine   Ab Set 10 reps;Other (comment)   with red theraband horizontal abduction 2x10   Knee/Hip Exercises: Standing   Hip Abduction Stengthening;Both;2 sets;10 reps  TA activation   Hip Extension Stengthening;Both;2 sets;10 reps  TA activation   Forward Step Up Both;2 sets;10 reps;Hand Hold: 2;Step Height: 6"                  PT Short Term Goals - 02/21/16 1159    PT SHORT TERM GOAL #2   Title report a 25% reduction in Lt SI/hip pain with standing at work   Status Achieved   PT SHORT TERM GOAL #3   Title report < or = to 4/10 Lt hip/SI joint pain at the end of a work shift   Status Achieved  2/10   PT SHORT TERM GOAL #4   Title verbalize and demonstrate correct body mechanics techniques to protect lumbar spine at home and work   Status Achieved           PT Long Term Goals - 01/17/16 1225    PT LONG TERM GOAL #1   Title be independent in advanced HEP   Time 8   Period Weeks   Status New   PT LONG TERM GOAL #2   Title reduce FOTO to <  or = to 31% limitaiton   Time 8   Period Weeks   Status New   PT LONG TERM GOAL #3   Title report a 60% reduction in Lt SI/hip pain with standing at work   Time 8   Period Weeks   Status New   PT LONG TERM GOAL #4   Title report < or = to 2-3/10 Lt SI/hip pain with standing at work   Time 8   Period Weeks   Status New               Plan - 02/21/16 1200    Clinical Impression Statement Pt reports 60% overall improvement in Lt hip/lumbar symptoms since the start of care.  Pt is using correct body mechanics with work tasks and ADLs and self care to protect spine.  Pt reports 2/10 Lt hip pain at the end of a work shift this week.     Rehab Potential Good   PT Frequency 2x / week   PT Duration 8 weeks   PT Treatment/Interventions ADLs/Self Care Home Management;Cryotherapy;Electrical Stimulation;Iontophoresis /ml Dexamethasone;Moist Heat;Therapeutic exercise;Therapeutic activities;Functional mobility training;Ultrasound;Neuromuscular  re-education;Patient/family education;Manual techniques;Taping;Dry needling;Passive range of motion   PT Next Visit Plan Lt hip strength, TA activation, flexiblity, modalities as needed.  Add standing hip to HEP if tolerated well today.   Consulted and Agree with Plan of Care Patient      Patient will benefit from skilled therapeutic intervention in order to improve the following deficits and impairments:  Postural dysfunction, Decreased mobility, Impaired flexibility, Improper body mechanics, Pain, Decreased activity tolerance, Increased muscle spasms  Visit Diagnosis: Hip pain, left  Posture abnormality  Stiffness of left hip, not elsewhere classified     Problem List Patient Active Problem List   Diagnosis Date Noted  . Lateral pain of left hip 10/25/2015  . Dizziness 06/12/2015  . Heel pain, bilateral 05/03/2015  . Obesity (BMI 30-39.9) 05/03/2015  . Hearing loss in right ear 03/01/2015  . Family history of diabetes mellitus in mother 03/01/2015  . Essential hypertension 01/18/2015  . Anemia, iron deficiency 01/04/2015  . Musculoskeletal chest pain 01/04/2015  . Thrombocytopenia (HCC) 01/04/2015  . Symptomatic anemia 01/03/2015  . Transaminitis 01/03/2015     Lorrene Reid, PT 02/21/2016 12:29 PM  [facility] [address] [place], [place], [address] Phone: [phone]   Fax:  [phone]  Name: Angela Castillo MRN: [id number] Date of Birth: 04-13-1976

## 2016-02-21 NOTE — Patient Instructions (Signed)
Plantar Fasciitis Plantar fasciitis is a painful foot condition that affects the heel. It occurs when the band of tissue that connects the toes to the heel bone (plantar fascia) becomes irritated. This can happen after exercising too much or doing other repetitive activities (overuse injury). The pain from plantar fasciitis can range from mild irritation to severe pain that makes it difficult for you to walk or move. The pain is usually worse in the morning or after you have been sitting or lying down for a while. CAUSES This condition may be caused by:  Standing for long periods of time.  Wearing shoes that do not fit.  Doing high-impact activities, including running, aerobics, and ballet.  Being overweight.  Having an abnormal way of walking (gait).  Having tight calf muscles.  Having high arches in your feet.  Starting a new athletic activity. SYMPTOMS The main symptom of this condition is heel pain. Other symptoms include:  Pain that gets worse after activity or exercise.  Pain that is worse in the morning or after resting.  Pain that goes away after you walk for a few minutes. DIAGNOSIS This condition may be diagnosed based on your signs and symptoms. Your health care provider will also do a physical exam to check for:  A tender area on the bottom of your foot.  A high arch in your foot.  Pain when you move your foot.  Difficulty moving your foot. You may also need to have imaging studies to confirm the diagnosis. These can include:  X-rays.  Ultrasound.  MRI. TREATMENT  Treatment for plantar fasciitis depends on the severity of the condition. Your treatment may include:  Rest, ice, and over-the-counter pain medicines to manage your pain.  Exercises to stretch your calves and your plantar fascia.  A splint that holds your foot in a stretched, upward position while you sleep (night splint).  Physical therapy to relieve symptoms and prevent problems in the  future.  Cortisone injections to relieve severe pain.  Extracorporeal shock wave therapy (ESWT) to stimulate damaged plantar fascia with electrical impulses. It is often used as a last resort before surgery.  Surgery, if other treatments have not worked after 12 months. HOME CARE INSTRUCTIONS  Take medicines only as directed by your health care provider.  Avoid activities that cause pain.  Roll the bottom of your foot over a bag of ice or a bottle of cold water. Do this for 20 minutes, 3-4 times a day.  Perform simple stretches as directed by your health care provider.  Try wearing athletic shoes with air-sole or gel-sole cushions or soft shoe inserts.  Wear a night splint while sleeping, if directed by your health care provider.  Keep all follow-up appointments with your health care provider. PREVENTION   Do not perform exercises or activities that cause heel pain.  Consider finding low-impact activities if you continue to have problems.  Lose weight if you need to. The best way to prevent plantar fasciitis is to avoid the activities that aggravate your plantar fascia. SEEK MEDICAL CARE IF:  Your symptoms do not go away after treatment with home care measures.  Your pain gets worse.  Your pain affects your ability to move or do your daily activities.   This information is not intended to replace advice given to you by your health care provider. Make sure you discuss any questions you have with your health care provider.   Document Released: 07/16/2001 Document Revised: 07/12/2015 Document Reviewed: 08/31/2014 Elsevier   Interactive Patient Education 2016 [employer]vier Inc.  

## 2016-02-21 NOTE — Progress Notes (Signed)
Patient ID: Angela Castillo, female   DOB: [birth year], 40 y.o.   MRN: [id number]  Subjective: This patient presents today complaining of right inferior heel pain began approximately a month ago. The heels especially uncomfortable FirstStep in the morning when she change from seated to standing position. Patient had one Kenalog Injection on the visit of 06/28/2015 which resolved the symptoms until present. Patient continues to work weightbearing occupation and [employer] 40 hours a week.     Physical Exam  Orientated 3  Vascular: No peripheral edema noted bilaterally DP and PT pulses 2/4 bilaterally Capillary reflex immediate bilaterally  Neurological: Sensation to 10 g monofilament wire intact 5/5 bilaterally Vibratory sensation reactive bilaterally Ankle reflex equal and reactive bilaterally  Dermatological: Texture and turgor within normal limits bilaterally There is no skin lesions noted on the left hallux  Ocular scaling lesions right and left feet  Musculoskeletal: Palpable tenderness medial plantar insertional area right without any palpable lesions Plantar flexion hallux 5/5 right and 4/5 left Palpable tenderness left hallux interphalangeal joint  X-ray examination weightbearing right foot dated 06/28/2015  Intact bony structures without fracture and/or dislocation Posterior and inferior calcaneal spurs  Radiographic impression: No acute bony abnormality noted in the right foot   X-ray examination weightbearing left foot dated 06/28/2015  Intact bony structures without fracture and/or dislocation noted  Radiographic impression: No acute bony abnormality noted in the left foot     Assessment & Plan:   Assessment: Satisfactory neurovascular status Plantar fasciitis right Tinea pedis right  Plan: Today I reviewed the results of examination x-rays with patient. In regards to the right heel pain I described plantar fasciitis and described  general treatment protocol and offered her a Kenalog Injection. She verbally consents   Skin is prepped with alcohol and Betadine and 10 mg of Kenalog mixed with 2.5 mg of plain Marcaine and 5 mg of plain Xylocaine were injected into the inferior heel right for Kenalog injection #2. Patient tolerated injection without any difficulty Home physical therapy was prescribed for follow-up treatment of plantar fasciitis patient was advised to   Digital scan obtained today for custom foot orthotics  [employer] firm extrinsic rear foot post  Patient advised to continue over-the-counter clotrimazole 1% cream 1-2 times daily 30 days of the right foot Notify patient on receipt of custom foot orthotics

## 2016-02-28 ENCOUNTER — Encounter: Payer: Self-pay | Admitting: Physical Therapy

## 2016-02-28 ENCOUNTER — Ambulatory Visit: Payer: BLUE CROSS/BLUE SHIELD | Admitting: Physical Therapy

## 2016-02-28 DIAGNOSIS — M25552 Pain in left hip: Secondary | ICD-10-CM | POA: Diagnosis not present

## 2016-02-28 DIAGNOSIS — M25652 Stiffness of left hip, not elsewhere classified: Secondary | ICD-10-CM

## 2016-02-28 DIAGNOSIS — R293 Abnormal posture: Secondary | ICD-10-CM

## 2016-02-28 NOTE — Therapy (Signed)
[facility] [address] [place], [place], [address] Phone: [phone]   Fax:  [phone]  Physical Therapy Treatment  Patient Details  Name: Angela Castillo MRN: [id number] Date of Birth: [birth year] Referring Provider: Dessa Phi, MD  Encounter Date: 02/28/2016      PT End of Session - 02/28/16 1157    Visit Number 6   Date for PT Re-Evaluation 03/13/16   PT Start Time 1143   PT Stop Time 1228   PT Time Calculation (min) 45 min   Activity Tolerance Patient tolerated treatment well   Behavior During Therapy [facility] for tasks assessed/performed      Past Medical History  Diagnosis Date  . Reflux   . Elevated liver enzymes   . Anemia Dx January 03 2015  . Hypertension Dx Apr 2016    Past Surgical History  Procedure Laterality Date  . Tubal ligation  Jul 25, 2000    There were no vitals filed for this visit.      Subjective Assessment - 02/28/16 1153    Subjective Pt feels better and reports compliance with exercises   Limitations Standing;Walking   How long can you stand comfortably? need to shift weight at work   Diagnostic tests x-ray: Lt SI joint scleroitc osteophyte   Patient Stated Goals reduce Lt buttock/lumbar pain, stand at work with less pain   Currently in Pain? Yes   Pain Score 2    Pain Location Hip   Pain Orientation Left   Pain Descriptors / Indicators Shooting;Throbbing   Pain Type Chronic pain   Pain Onset More than a month ago   Pain Frequency Intermittent   Aggravating Factors  standing, activity   Pain Relieving Factors pain meds, shifting weight from side to side, sitting down   Multiple Pain Sites Yes   Pain Score 2   Pain Location Back   Pain Descriptors / Indicators Aching;Sharp   Pain Type Chronic pain   Pain Onset More than a month ago   Pain Frequency Constant                         [facility] Adult PT Treatment/Exercise - 02/28/16 0001    Posture/Postural Control   Posture/Postural Control Postural limitations   Postural Limitations Rounded Shoulders;Forward head;Decreased lumbar lordosis;Weight shift right;Posterior pelvic tilt   Lumbar Exercises: Aerobic   Stationary Bike Nustep L1 x108 min   Lumbar Exercises: Standing   Row Strengthening;Both;20 reps   Other Standing Lumbar Exercises sitting abdominals with yellow plyoball diagonal 3 x 10  pt tolerated well   Other Standing Lumbar Exercises walking backward 25# x 10   Knee/Hip Exercises: Standing   Hip Abduction Stengthening;Both;2 sets;10 reps  TA activation   Hip Extension Stengthening;Both;2 sets;10 reps  TA activation   Forward Step Up Both;2 sets;10 reps;Hand Hold: 2;Step Height: 6"                PT Education - 02/28/16 1210    Education provided Yes   Education Details SLR in 3 directions in standing: ABD, ext 7 flexion    Person(s) Educated Patient   Methods Explanation;Demonstration;Handout   Comprehension Verbalized understanding;Returned demonstration          PT Short Term Goals - 02/21/16 1159    PT SHORT TERM GOAL #2   Title report a 25% reduction in Lt SI/hip pain with standing at work   Status Achieved   PT  SHORT TERM GOAL #3   Title report < or = to 4/10 Lt hip/SI joint pain at the end of a work shift   Status Achieved  2/10   PT SHORT TERM GOAL #4   Title verbalize and demonstrate correct body mechanics techniques to protect lumbar spine at home and work   Status Achieved           PT Long Term Goals - 02/28/16 1223    PT LONG TERM GOAL #1   Title be independent in advanced HEP   Time 8   Period Weeks   Status On-going   PT LONG TERM GOAL #2   Title reduce FOTO to < or = to 31% limitaiton   Time 8   Period Weeks   Status On-going   PT LONG TERM GOAL #3   Title report a 60% reduction in Lt SI/hip pain with standing at work   Time 8   Period Weeks   Status On-going   PT LONG TERM GOAL #4   Title report < or =  to 2-3/10 Lt SI/hip pain with standing at work   Time 8   Period Weeks   Status On-going               Plan - 02/28/16 1159    Clinical Impression Statement Pt is alternating her position at work and incoperate exercises at work as hip abd and extension. Pt with less frequence and duration of pain at end of workshift.    Rehab Potential Good   PT Frequency 2x / week   PT Duration 8 weeks   PT Treatment/Interventions ADLs/Self Care Home Management;Cryotherapy;Electrical Stimulation;Iontophoresis 4mg /ml Dexamethasone;Moist Heat;Therapeutic exercise;Therapeutic activities;Functional mobility training;Ultrasound;Neuromuscular re-education;Patient/family education;Manual techniques;Taping;Dry needling;Passive range of motion   PT Next Visit Plan Lt hip strength, TA activation, flexiblity, modalities as needed. Review Hip exercises in standing   Consulted and Agree with Plan of Care Patient      Patient will benefit from skilled therapeutic intervention in order to improve the following deficits and impairments:  Postural dysfunction, Decreased mobility, Impaired flexibility, Improper body mechanics, Pain, Decreased activity tolerance, Increased muscle spasms  Visit Diagnosis: Hip pain, left  Posture abnormality  Stiffness of left hip, not elsewhere classified     Problem List Patient Active Problem List   Diagnosis Date Noted  . Lateral pain of left hip 10/25/2015  . Dizziness 06/12/2015  . Heel pain, bilateral 05/03/2015  . Obesity (BMI 30-39.9) 05/03/2015  . Hearing loss in right ear 03/01/2015  . Family history of diabetes mellitus in mother 03/01/2015  . Essential hypertension 01/18/2015  . Anemia, iron deficiency 01/04/2015  . Musculoskeletal chest pain 01/04/2015  . Thrombocytopenia (HCC) 01/04/2015  . Symptomatic anemia 01/03/2015  . Transaminitis 01/03/2015    NAUMANN-HOUEGNIFIO,Denario Bagot PTA 02/28/2016, 12:26 PM  [facility] [address], [address] [place], [place], [address] Phone: [phone]   Fax:  [phone]  Name: Angela Castillo MRN: [id number] Date of Birth: Jan 02, 1976

## 2016-02-28 NOTE — Patient Instructions (Signed)
Knee High   Holding stable object, raise knee to hip level, then lower knee. Repeat with other knee. Complete __10_ repetitions. Do __2__ sessions per day.  ABDUCTION: Standing (Active)   Stand, feet flat. Lift right leg out to side. Use _0__ lbs. Complete __10_ repetitions. Perform __2_ sessions per day.    EXTENSION: Standing (Active)  Stand, both feet flat. Draw right leg behind body as far as possible. Use 0___ lbs. Complete 10 repetitions. Perform __2_ sessions per day.  Copyright  VHI. All rights reserved.   [facility] [address], [address] [place], [place] [address] Phone # [phone] Fax [phone] 

## 2016-03-04 MED FILL — LOSARTAN POTASSIUM 100 MG T: 100 | 30 days supply | Qty: 30 | Fill #4

## 2016-03-06 ENCOUNTER — Ambulatory Visit: Payer: BLUE CROSS/BLUE SHIELD | Attending: Family Medicine | Admitting: Physical Therapy

## 2016-03-06 ENCOUNTER — Encounter: Payer: Self-pay | Admitting: Physical Therapy

## 2016-03-06 DIAGNOSIS — M25659 Stiffness of unspecified hip, not elsewhere classified: Secondary | ICD-10-CM | POA: Diagnosis present

## 2016-03-06 DIAGNOSIS — R293 Abnormal posture: Secondary | ICD-10-CM

## 2016-03-06 DIAGNOSIS — M25652 Stiffness of left hip, not elsewhere classified: Secondary | ICD-10-CM | POA: Diagnosis present

## 2016-03-06 DIAGNOSIS — M25552 Pain in left hip: Secondary | ICD-10-CM | POA: Diagnosis present

## 2016-03-06 NOTE — Therapy (Signed)
[facility] [address], [address] [place], [place], [address] Phone: [phone]   Fax:  [phone]  Physical Therapy Treatment  Patient Details  Name: Angela FothergillMichelle D Raiche MRN: [id number] Date of Birth: [birth year] Referring Provider: Dessa PhiFunches, Josalyn, MD  Encounter Date: 03/06/2016      PT End of Session - 03/06/16 1206    Visit Number 7   Date for PT Re-Evaluation 03/13/16   PT Start Time 1145   PT Stop Time 1229   PT Time Calculation (min) 44 min   Activity Tolerance Patient tolerated treatment well   Behavior [facility] Therapy [facility] for tasks assessed/performed      Past Medical History  Diagnosis Date  . Reflux   . Elevated liver enzymes   . Anemia Dx January 03 2015  . Hypertension Dx Apr 2016    Past Surgical History  Procedure Laterality Date  . Tubal ligation  Jul 25, 2000    There were no vitals filed for this visit.      Subjective Assessment - 03/06/16 1156    Subjective Pt has no complain of pain.   Limitations Standing;Walking   How long can you stand comfortably? need to shift weight at work   Diagnostic tests x-ray: Lt SI joint scleroitc osteophyte   Patient Stated Goals reduce Lt buttock/lumbar pain, stand at work with less pain   Currently in Pain? No/denies                         [facility] Adult PT Treatment/Exercise - 03/06/16 0001    Posture/Postural Control   Posture/Postural Control Postural limitations   Postural Limitations Rounded Shoulders;Forward head;Decreased lumbar lordosis;Weight shift right;Posterior pelvic tilt   Lumbar Exercises: Aerobic   Stationary Bike Bike L 2 x 8min   Lumbar Exercises: Standing   Row Strengthening;Both;20 reps;Theraband  red t-band, TA activation   Other Standing Lumbar Exercises sitting abdominals with yellow plyoball diagonal 3 x 10   Other Standing Lumbar Exercises walking backward 25#  2x10  with TA acitvation   Knee/Hip Exercises:  Standing   Hip Abduction --   Hip Extension Stengthening;Both;2 sets;10 reps  2# added with TA activation   Lateral Step Up 2 sets;10 reps;Hand Hold: 1  2# added, with hip abduction opposite side   Forward Step Up Both;2 sets;10 reps;Hand Hold: 2;Step Height: 6"                  PT Short Term Goals - 02/21/16 1159    PT SHORT TERM GOAL #2   Title report a 25% reduction in Lt SI/hip pain with standing at work   Status Achieved   PT SHORT TERM GOAL #3   Title report < or = to 4/10 Lt hip/SI joint pain at the end of a work shift   Status Achieved  2/10   PT SHORT TERM GOAL #4   Title verbalize and demonstrate correct body mechanics techniques to protect lumbar spine at home and work   Status Achieved           PT Long Term Goals - 03/06/16 1208    PT LONG TERM GOAL #1   Title be independent in advanced HEP   Time 8   Period Weeks   Status On-going   PT LONG TERM GOAL #2   Title reduce FOTO to < or = to 31% limitaiton   Time 8   Period Weeks   Status On-going  PT LONG TERM GOAL #3   Title report a 60% reduction in Lt SI/hip pain with standing at work   Time 8   Period Weeks   Status Achieved   PT LONG TERM GOAL #4   Title report < or = to 2-3/10 Lt SI/hip pain with standing at work   Time 8   Period Weeks   Status On-going               Plan - 03/06/16 1317    Clinical Impression Statement Pt continues to be veryf motivated, but presents with low endurance and overall muscle weakness, after sidestepping with 2# weights and abd very fatique. Pt will continue to benefit from skilled PT   Rehab Potential Good   PT Frequency 2x / week   PT Duration 8 weeks   PT Treatment/Interventions ADLs/Self Care Home Management;Cryotherapy;Electrical Stimulation;Iontophoresis /ml Dexamethasone;Moist Heat;Therapeutic exercise;Therapeutic activities;Functional mobility training;Ultrasound;Neuromuscular re-education;Patient/family education;Manual  techniques;Taping;Dry needling;Passive range of motion   PT Next Visit Plan Lt hip strength, TA activation, flexiblity, modalities as needed. Add weights to exercises   Consulted and Agree with Plan of Care Patient      Patient will benefit from skilled therapeutic intervention in order to improve the following deficits and impairments:  Postural dysfunction, Decreased mobility, Impaired flexibility, Improper body mechanics, Pain, Decreased activity tolerance, Increased muscle spasms  Visit Diagnosis: Hip pain, left  Posture abnormality  Stiffness of left hip, not elsewhere classified  Hip stiffness, unspecified laterality     Problem List Patient Active Problem List   Diagnosis Date Noted  . Lateral pain of left hip 10/25/2015  . Dizziness 06/12/2015  . Heel pain, bilateral 05/03/2015  . Obesity (BMI 30-39.9) 05/03/2015  . Hearing loss in right ear 03/01/2015  . Family history of diabetes mellitus in mother 03/01/2015  . Essential hypertension 01/18/2015  . Anemia, iron deficiency 01/04/2015  . Musculoskeletal chest pain 01/04/2015  . Thrombocytopenia (HCC) 01/04/2015  . Symptomatic anemia 01/03/2015  . Transaminitis 01/03/2015     Susannah Carbin Naumann-Houegnifio, PTA 03/06/2016 1:21 PM  [facility] [address], [address] [place], [place], [address] Phone: [phone]   Fax:  [phone]  Name: Angela Castillo MRN: [id number] Date of Birth: July 29, 1976

## 2016-03-13 ENCOUNTER — Ambulatory Visit: Payer: BLUE CROSS/BLUE SHIELD

## 2016-03-13 ENCOUNTER — Ambulatory Visit: Payer: BLUE CROSS/BLUE SHIELD | Admitting: Podiatry

## 2016-03-13 DIAGNOSIS — R293 Abnormal posture: Secondary | ICD-10-CM

## 2016-03-13 DIAGNOSIS — M25552 Pain in left hip: Secondary | ICD-10-CM | POA: Diagnosis not present

## 2016-03-13 DIAGNOSIS — M25652 Stiffness of left hip, not elsewhere classified: Secondary | ICD-10-CM

## 2016-03-13 NOTE — Therapy (Signed)
[facility] [address], [place], [place], [address] Phone: [phone]   Fax:  [phone]  Physical Therapy Treatment  Patient Details  Name: Angela Castillo MRN: [id number] Date of Birth: Mar 21, 1976 Referring Provider: Boykin Nearing, MD  Encounter Date: 03/13/2016      PT End of Session - 03/13/16 1216    Visit Number 8   PT Start Time 2952   PT Stop Time 8413  final visit.  No pain.     PT Time Calculation (min) 33 min   Activity Tolerance Patient tolerated treatment well   Behavior During Therapy WFL for tasks assessed/performed      Past Medical History  Diagnosis Date  . Reflux   . Elevated liver enzymes   . Anemia Dx January 03 2015  . Hypertension Dx Apr 2016    Past Surgical History  Procedure Laterality Date  . Tubal ligation  Jul 25, 2000    There were no vitals filed for this visit.      Subjective Assessment - 03/13/16 1158    Subjective Ready for D/C to HEP today.     Currently in Pain? Yes   Pain Score 2    Pain Location Hip   Pain Orientation Left   Pain Descriptors / Indicators Shooting;Throbbing   Pain Type Chronic pain   Pain Onset More than a month ago   Pain Frequency Intermittent   Aggravating Factors  standing, work, activity   Pain Relieving Factors rest, shifting weight, sitting down, pain meds            [facility] PT Assessment - 03/13/16 0001    Assessment   Medical Diagnosis Lateral pain of the Lt hip   Onset Date/Surgical Date 08/19/15   [place] residence   Prior Function   Level of Independence Independent   Cognition   Overall Cognitive Status Within Functional Limits for tasks assessed   Observation/Other Assessments   Focus on Therapeutic Outcomes (FOTO)  33% limitation                     [facility] Adult PT Treatment/Exercise - 03/13/16 0001    Lumbar Exercises: Aerobic   Stationary Bike Bike L 2  x 66mn   Lumbar Exercises: Standing   Other Standing Lumbar Exercises sitting abdominals with yellow plyoball diagonal 3 x 10   Other Standing Lumbar Exercises walking backward 25#  2x10  with TA acitvation   Knee/Hip Exercises: Standing   Hip Abduction Stengthening;Both;2 sets;10 reps  TA activation   Hip Extension Stengthening;Both;2 sets;10 reps  2# added with TA activation   Lateral Step Up 2 sets;10 reps;Hand Hold: 1  2# added, with hip abduction opposite side   Forward Step Up Both;2 sets;10 reps;Hand Hold: 2;Step Height: 6"                  PT Short Term Goals - 02/21/16 1159    PT SHORT TERM GOAL #2   Title report a 25% reduction in Lt SI/hip pain with standing at work   Status Achieved   PT SHORT TERM GOAL #3   Title report < or = to 4/10 Lt hip/SI joint pain at the end of a work shift   Status Achieved  2/10   PT SHORT TERM GOAL #4   Title verbalize and demonstrate correct body mechanics techniques to protect lumbar spine at home and work   Status Achieved  PT Long Term Goals - 03/13/16 1156    PT LONG TERM GOAL #1   Title be independent in advanced HEP   Status Achieved   PT LONG TERM GOAL #2   Title reduce FOTO to < or = to 31% limitaiton   Status Partially Met  33% limitation   PT LONG TERM GOAL #3   Title report a 60% reduction in Lt SI/hip pain with standing at work   Status Achieved   PT LONG TERM GOAL #4   Title report < or = to 2-3/10 Lt SI/hip pain with standing at work   Status Achieved               Plan - 03/13/16 1203    Clinical Impression Statement Pt reports 90% overall improvement since the start of care. FOTO is 33% limitation (improved from 40%) and pt reports 3/10 Lt hip pain at work.  Pt has HEP in place for endurance, strength and flexibility.  Pt is using body mechanics modifications for home and work tasks.  Pt will D/C to HEP.     PT Next Visit Plan D/C PT   Consulted and Agree with Plan of Care Patient       Patient will benefit from skilled therapeutic intervention in order to improve the following deficits and impairments:     Visit Diagnosis: Hip pain, left  Posture abnormality  Stiffness of left hip, not elsewhere classified     Problem List Patient Active Problem List   Diagnosis Date Noted  . Lateral pain of left hip 10/25/2015  . Dizziness 06/12/2015  . Heel pain, bilateral 05/03/2015  . Obesity (BMI 30-39.9) 05/03/2015  . Hearing loss in right ear 03/01/2015  . Family history of diabetes mellitus in mother 03/01/2015  . Essential hypertension 01/18/2015  . Anemia, iron deficiency 01/04/2015  . Musculoskeletal chest pain 01/04/2015  . Thrombocytopenia ([place]) 01/04/2015  . Symptomatic anemia 01/03/2015  . Transaminitis 01/03/2015   PHYSICAL THERAPY DISCHARGE SUMMARY  Visits from Start of Care: 8  Current functional level related to goals / functional outcomes: See above for current status.     Remaining deficits: Lt hip pain and LBP with work activity.  Pt has HEP in place and reports 90% overall imrprovement.     Education / Equipment: HEP, Economist Plan: Patient agrees to discharge.  Patient goals were partially met. Patient is being discharged due to being pleased with the current functional level.  ?????     Sigurd Sos, PT 03/13/2016 12:17 PM   [facility] [address], [place], [place], [address] Phone: [phone]   Fax:  [phone]  Name: Angela Castillo MRN: [id number] Date of Birth: 1975-12-25

## 2016-03-18 ENCOUNTER — Encounter: Payer: Self-pay | Admitting: Family Medicine

## 2016-03-18 ENCOUNTER — Ambulatory Visit: Payer: BLUE CROSS/BLUE SHIELD | Attending: Family Medicine | Admitting: Family Medicine

## 2016-03-18 VITALS — BP 132/86 | HR 72 | Temp 98.9°F | Resp 16 | Ht 63.0 in | Wt 229.0 lb

## 2016-03-18 DIAGNOSIS — Z79899 Other long term (current) drug therapy: Secondary | ICD-10-CM | POA: Diagnosis not present

## 2016-03-18 DIAGNOSIS — M545 Low back pain, unspecified: Secondary | ICD-10-CM | POA: Insufficient documentation

## 2016-03-18 DIAGNOSIS — K219 Gastro-esophageal reflux disease without esophagitis: Secondary | ICD-10-CM | POA: Diagnosis not present

## 2016-03-18 DIAGNOSIS — I10 Essential (primary) hypertension: Secondary | ICD-10-CM | POA: Diagnosis not present

## 2016-03-18 DIAGNOSIS — M25512 Pain in left shoulder: Secondary | ICD-10-CM | POA: Insufficient documentation

## 2016-03-18 DIAGNOSIS — M25552 Pain in left hip: Secondary | ICD-10-CM

## 2016-03-18 MED ORDER — LOSARTAN POTASSIUM 100 MG PO TABS: 100.0000 mg | ORAL_TABLET | Freq: Every day | ORAL | Status: DC

## 2016-03-18 MED ORDER — MELOXICAM 15 MG PO TABS: 15.0000 mg | ORAL_TABLET | Freq: Every day | ORAL | Status: DC

## 2016-03-18 MED ORDER — ESOMEPRAZOLE MAGNESIUM 40 MG PO CPDR: 40.0000 mg | DELAYED_RELEASE_CAPSULE | Freq: Every day | ORAL | Status: DC

## 2016-03-18 MED FILL — ESOMEPRAZOLE MAG DR 40 MG C: 40 | 30 days supply | Qty: 30 | Fill #0

## 2016-03-18 MED FILL — MELOXICAM 15 MG TABLET: 15 | 30 days supply | Qty: 30 | Fill #0

## 2016-03-18 NOTE — Progress Notes (Signed)
Subjective:  Patient ID: Angela FothergillMichelle D Castillo, female    DOB: 01-13-1976  Age: 40 y.o. MRN: [id number]  CC: Gastroesophageal Reflux and Hypertension   HPI Angela FothergillMichelle D Castillo presents for    1.  GERD: has hx of GERD. Took nexium and prevacid in the past. nexium worked best. Now with heart burn 2-3 times per week. She tries to avoid food triggers. She take naproxen or ibuprofen prn hip, back and shoulder pain. She does not drink ETOH.   2. HTN: taking losartan. No HA, CP, SOB or leg swelling.   3.  L shoulder pain: for past month. No trauma. Comes and goes. Worse with raising arm. Lateral and anterior shoulder pain. No weakness in L hand.   Social History  Substance Use Topics  . Smoking status: Never Smoker   . Smokeless tobacco: Not on file  . Alcohol Use: Yes     Comment: rarely    Outpatient Prescriptions Prior to Visit  Medication Sig Dispense Refill  . diclofenac (VOLTAREN) 75 MG EC tablet Take 1 tablet (75 mg total) by mouth 2 (two) times daily. 30 tablet 0  . ferrous sulfate 325 (65 FE) MG tablet Take 325 mg by mouth 2 (two) times daily with a meal.    . fluticasone (FLONASE) 50 MCG/ACT nasal spray Place 2 sprays into both nostrils daily. (Patient taking differently: Place 2 sprays into both nostrils daily as needed for allergies or rhinitis. ) 16 g 6  . losartan (COZAAR) 100 MG tablet Take 1 tablet (100 mg total) by mouth daily. 30 tablet 5  . naproxen (NAPROSYN) 500 MG tablet Take 1 tablet (500 mg total) by mouth 2 (two) times daily. 14 tablet 0  . traMADol (ULTRAM) 50 MG tablet Take 1 tablet (50 mg total) by mouth every 8 (eight) hours as needed. 60 tablet 0  . methocarbamol (ROBAXIN) 500 MG tablet Take 1 tablet (500 mg total) by mouth 2 (two) times daily. (Patient not taking: Reported on 03/18/2016) 20 tablet 0  . Multiple Vitamins-Minerals (HAIR SKIN AND NAILS FORMULA PO) Take 2 each by mouth every morning. Reported on 03/18/2016     No facility-administered  medications prior to visit.    ROS Review of Systems  Constitutional: Negative for fever and chills.  Eyes: Negative for visual disturbance.  Respiratory: Negative for shortness of breath.   Cardiovascular: Positive for chest pain (burning, comes and goes, substernal wtih sour taste in her mouth ).  Gastrointestinal: Negative for abdominal pain and blood in stool.  Genitourinary: Positive for flank pain (b/l comes and goes, sharp pains ).  Musculoskeletal: Positive for arthralgias. Negative for back pain.  Skin: Negative for rash.  Allergic/Immunologic: Negative for immunocompromised state.  Hematological: Negative for adenopathy. Does not bruise/bleed easily.  Psychiatric/Behavioral: Negative for suicidal ideas and dysphoric mood.    Objective:  BP 132/86 mmHg  Pulse 72  Temp(Src) 98.9 F (37.2 C) (Oral)  Resp 16  Ht 5\' 3"  (1.6 m)  Wt 229 lb (103.874 kg)  BMI 40.58 kg/m2  SpO2 99%  LMP 02/26/2016  BP/Weight 03/18/2016 02/21/2016 02/01/2016  Systolic BP 132 124 140  Diastolic BP 86 80 90  Wt. (Lbs) 229 - 232  BMI 40.58 - 39.8   Physical Exam  Constitutional: She is oriented to person, place, and time. She appears well-developed and well-nourished. No distress.  HENT:  Head: Normocephalic and atraumatic.  Cardiovascular: Normal rate, regular rhythm, normal heart sounds and intact distal pulses.   Pulmonary/Chest: Effort  normal and breath sounds normal.  Musculoskeletal: She exhibits no edema.       Left shoulder: Normal.  Neurological: She is alert and oriented to person, place, and time.  Skin: Skin is warm and dry. No rash noted.  Psychiatric: She has a normal mood and affect.     Assessment & Plan:   There are no diagnoses linked to this encounter. Hayly was seen today for gastroesophageal reflux and hypertension.  Diagnoses and all orders for this visit:  Lateral pain of left hip -     meloxicam (MOBIC) 15 MG tablet; Take 1 tablet (15 mg total) by mouth  daily.  Bilateral low back pain without sciatica -     meloxicam (MOBIC) 15 MG tablet; Take 1 tablet (15 mg total) by mouth daily.  Left shoulder pain -     meloxicam (MOBIC) 15 MG tablet; Take 1 tablet (15 mg total) by mouth daily.  Essential hypertension -     losartan (COZAAR) 100 MG tablet; Take 1 tablet (100 mg total) by mouth daily.  Gastroesophageal reflux disease, esophagitis presence not specified -     esomeprazole (NEXIUM) 40 MG capsule; Take 1 capsule (40 mg total) by mouth daily.   Meds ordered this encounter  Medications  . meloxicam (MOBIC) 15 MG tablet    Sig: Take 1 tablet (15 mg total) by mouth daily.    Dispense:  30 tablet    Refill:  0  . esomeprazole (NEXIUM) 40 MG capsule    Sig: Take 1 capsule (40 mg total) by mouth daily.    Dispense:  30 capsule    Refill:  3  . losartan (COZAAR) 100 MG tablet    Sig: Take 1 tablet (100 mg total) by mouth daily.    Dispense:  30 tablet    Refill:  5    Follow-up: No Follow-up on file.   Dessa Phi MD

## 2016-03-18 NOTE — Patient Instructions (Addendum)
Angela Castillo was seen today for gastroesophageal reflux and hypertension.  Diagnoses and all orders for this visit:  Lateral pain of left hip -     meloxicam (MOBIC) 15 MG tablet; Take 1 tablet (15 mg total) by mouth daily.  Bilateral low back pain without sciatica -     meloxicam (MOBIC) 15 MG tablet; Take 1 tablet (15 mg total) by mouth daily.  Left shoulder pain -     meloxicam (MOBIC) 15 MG tablet; Take 1 tablet (15 mg total) by mouth daily.  Essential hypertension -     losartan (COZAAR) 100 MG tablet; Take 1 tablet (100 mg total) by mouth daily.  Gastroesophageal reflux disease, esophagitis presence not specified -     esomeprazole (NEXIUM) 40 MG capsule; Take 1 capsule (40 mg total) by mouth daily.   F/u in  6 weeks for shoulder pain   Dr . Armen Pickup   Generic Shoulder Exercises EXERCISES  RANGE OF MOTION (ROM) AND STRETCHING EXERCISES These exercises may help you when beginning to rehabilitate your injury. Your symptoms may resolve with or without further involvement from your physician, physical therapist or athletic trainer. While completing these exercises, remember:   Restoring tissue flexibility helps normal motion to return to the joints. This allows healthier, less painful movement and activity.  An effective stretch should be held for at least 30 seconds.  A stretch should never be painful. You should only feel a gentle lengthening or release in the stretched tissue. ROM - Pendulum  Bend at the waist so that your right / left arm falls away from your body. Support yourself with your opposite hand on a solid surface, such as a table or a countertop.  Your right / left arm should be perpendicular to the ground. If it is not perpendicular, you need to lean over farther. Relax the muscles in your right / left arm and shoulder as much as possible.  Gently sway your hips and trunk so they move your right / left arm without any use of your right / left shoulder  muscles.  Progress your movements so that your right / left arm moves side to side, then forward and backward, and finally, both clockwise and counterclockwise.  Complete __________ repetitions in each direction. Many people use this exercise to relieve discomfort in their shoulder as well as to gain range of motion. Repeat __________ times. Complete this exercise __________ times per day. STRETCH - Flexion, Standing  Stand with good posture. With an underhand grip on your right / left hand and an overhand grip on the opposite hand, grasp a broomstick or cane so that your hands are a little more than shoulder-width apart.  Keeping your right / left elbow straight and shoulder muscles relaxed, push the stick with your opposite hand to raise your right / left arm in front of your body and then overhead. Raise your arm until you feel a stretch in your right / left shoulder, but before you have increased shoulder pain.  Try to avoid shrugging your right / left shoulder as your arm rises by keeping your shoulder blade tucked down and toward your mid-back spine. Hold __________ seconds.  Slowly return to the starting position. Repeat __________ times. Complete this exercise __________ times per day. STRETCH - Internal Rotation  Place your right / left hand behind your back, palm-up.  Throw a towel or belt over your opposite shoulder. Grasp the towel/belt with your right / left hand.  While keeping an upright  posture, gently pull up on the towel/belt until you feel a stretch in the front of your right / left shoulder.  Avoid shrugging your right / left shoulder as your arm rises by keeping your shoulder blade tucked down and toward your mid-back spine.  Hold __________. Release the stretch by lowering your opposite hand. Repeat __________ times. Complete this exercise __________ times per day. STRETCH - External Rotation and Abduction  Stagger your stance through a doorframe. It does not  matter which foot is forward.  As instructed by your physician, physical therapist or athletic trainer, place your hands:  And forearms above your head and on the door frame.  And forearms at head-height and on the door frame.  At elbow-height and on the door frame.  Keeping your head and chest upright and your stomach muscles tight to prevent over-extending your low-back, slowly shift your weight onto your front foot until you feel a stretch across your chest and/or in the front of your shoulders.  Hold __________ seconds. Shift your weight to your back foot to release the stretch. Repeat __________ times. Complete this stretch __________ times per day.  STRENGTHENING EXERCISES  These exercises may help you when beginning to rehabilitate your injury. They may resolve your symptoms with or without further involvement from your physician, physical therapist or athletic trainer. While completing these exercises, remember:   Muscles can gain both the endurance and the strength needed for everyday activities through controlled exercises.  Complete these exercises as instructed by your physician, physical therapist or athletic trainer. Progress the resistance and repetitions only as guided.  You may experience muscle soreness or fatigue, but the pain or discomfort you are trying to eliminate should never worsen during these exercises. If this pain does worsen, stop and make certain you are following the directions exactly. If the pain is still present after adjustments, discontinue the exercise until you can discuss the trouble with your clinician.  If advised by your physician, during your recovery, avoid activity or exercises which involve actions that place your right / left hand or elbow above your head or behind your back or head. These positions stress the tissues which are trying to heal. STRENGTH - Scapular Depression and Adduction  With good posture, sit on a firm chair. Supported your  arms in front of you with pillows, arm rests or a table top. Have your elbows in line with the sides of your body.  Gently draw your shoulder blades down and toward your mid-back spine. Gradually increase the tension without tensing the muscles along the top of your shoulders and the back of your neck.  Hold for __________ seconds. Slowly release the tension and relax your muscles completely before completing the next repetition.  After you have practiced this exercise, remove the arm support and complete it in standing as well as sitting. Repeat __________ times. Complete this exercise __________ times per day.  STRENGTH - External Rotators  Secure a rubber exercise band/tubing to a fixed object so that it is at the same height as your right / left elbow when you are standing or sitting on a firm surface.  Stand or sit so that the secured exercise band/tubing is at your side that is not injured.  Bend your elbow 90 degrees. Place a folded towel or small pillow under your right / left arm so that your elbow is a few inches away from your side.  Keeping the tension on the exercise band/tubing, pull it away from  your body, as if pivoting on your elbow. Be sure to keep your body steady so that the movement is only coming from your shoulder rotating.  Hold __________ seconds. Release the tension in a controlled manner as you return to the starting position. Repeat __________ times. Complete this exercise __________ times per day.  STRENGTH - Supraspinatus  Stand or sit with good posture. Grasp a __________ weight or an exercise band/tubing so that your hand is "thumbs-up," like when you shake hands.  Slowly lift your right / left hand from your thigh into the air, traveling about 30 degrees from straight out at your side. Lift your hand to shoulder height or as far as you can without increasing any shoulder pain. Initially, many people do not lift their hands above shoulder height.  Avoid  shrugging your right / left shoulder as your arm rises by keeping your shoulder blade tucked down and toward your mid-back spine.  Hold for __________ seconds. Control the descent of your hand as you slowly return to your starting position. Repeat __________ times. Complete this exercise __________ times per day.  STRENGTH - Shoulder Extensors  Secure a rubber exercise band/tubing so that it is at the height of your shoulders when you are either standing or sitting on a firm arm-less chair.  With a thumbs-up grip, grasp an end of the band/tubing in each hand. Straighten your elbows and lift your hands straight in front of you at shoulder height. Step back away from the secured end of band/tubing until it becomes tense.  Squeezing your shoulder blades together, pull your hands down to the sides of your thighs. Do not allow your hands to go behind you.  Hold for __________ seconds. Slowly ease the tension on the band/tubing as you reverse the directions and return to the starting position. Repeat __________ times. Complete this exercise __________ times per day.  STRENGTH - Scapular Retractors  Secure a rubber exercise band/tubing so that it is at the height of your shoulders when you are either standing or sitting on a firm arm-less chair.  With a palm-down grip, grasp an end of the band/tubing in each hand. Straighten your elbows and lift your hands straight in front of you at shoulder height. Step back away from the secured end of band/tubing until it becomes tense.  Squeezing your shoulder blades together, draw your elbows back as you bend them. Keep your upper arm lifted away from your body throughout the exercise.  Hold __________ seconds. Slowly ease the tension on the band/tubing as you reverse the directions and return to the starting position. Repeat __________ times. Complete this exercise __________ times per day. STRENGTH - Scapular Depressors  Find a sturdy chair without wheels,  such as a from a dining room table.  Keeping your feet on the floor, lift your bottom from the seat and lock your elbows.  Keeping your elbows straight, allow gravity to pull your body weight down. Your shoulders will rise toward your ears.  Raise your body against gravity by drawing your shoulder blades down your back, shortening the distance between your shoulders and ears. Although your feet should always maintain contact with the floor, your feet should progressively support less body weight as you get stronger.  Hold __________ seconds. In a controlled and slow manner, lower your body weight to begin the next repetition. Repeat __________ times. Complete this exercise __________ times per day.    This information is not intended to replace advice given to you  by your health care provider. Make sure you discuss any questions you have with your health care provider.   Document Released: 09/04/2005 Document Revised: 11/11/2014 Document Reviewed: 02/02/2009 Elsevier Interactive Patient Education [employer]

## 2016-03-18 NOTE — Progress Notes (Signed)
F/U HTN, acid reflux  Taking medication as prescribed  Pain scale # 4 No tobacco user  No suicidal thoughts in the past two weeks

## 2016-03-18 NOTE — Assessment & Plan Note (Signed)
Suspect bursitis mobic Home PT

## 2016-03-18 NOTE — Assessment & Plan Note (Signed)
GERD nexium 40 mg daily  Avoid food triggers

## 2016-04-04 MED FILL — LOSARTAN POTASSIUM 100 MG T: 100 | 30 days supply | Qty: 30 | Fill #5

## 2016-04-23 ENCOUNTER — Ambulatory Visit: Payer: BLUE CROSS/BLUE SHIELD | Attending: Internal Medicine | Admitting: Pharmacist

## 2016-04-23 ENCOUNTER — Encounter: Payer: Self-pay | Admitting: Pharmacist

## 2016-04-23 VITALS — BP 135/91

## 2016-04-23 DIAGNOSIS — I10 Essential (primary) hypertension: Secondary | ICD-10-CM | POA: Diagnosis not present

## 2016-04-23 DIAGNOSIS — M545 Low back pain, unspecified: Secondary | ICD-10-CM

## 2016-04-23 DIAGNOSIS — M25552 Pain in left hip: Secondary | ICD-10-CM | POA: Diagnosis not present

## 2016-04-23 DIAGNOSIS — M25512 Pain in left shoulder: Secondary | ICD-10-CM

## 2016-04-23 MED ORDER — MELOXICAM 15 MG PO TABS: 15.0000 mg | ORAL_TABLET | Freq: Every day | ORAL | Status: DC

## 2016-04-23 MED FILL — MELOXICAM 15 MG TABLET: 15 | 30 days supply | Qty: 30 | Fill #0

## 2016-04-23 NOTE — Progress Notes (Signed)
   S:    Patient arrives for a blood pressure check. She was last seen by her PCP, Dr. Armen PickupFunches, on 03/18/16.   Patient reports adherence with medications.  Current BP Medications include:  Losartan 100 mg daily.   Patient uses PPI as needed for her GERD but she isn't really seeing much improvement.   Patient eats [employer] as she works there. She is also trying to exercise more to lose weight to help with the hip pain.  O:   Last 3 Office BP readings: BP Readings from Last 3 Encounters:  04/23/16 135/91  03/18/16 132/86  02/21/16 124/80    BMET    Component Value Date/Time   NA 141 08/16/2015 1742   K 3.7 08/16/2015 1742   CL 102 08/16/2015 1742   CO2 25 06/12/2015 1801   GLUCOSE 98 08/16/2015 1742   BUN 14 08/16/2015 1742   CREATININE 0.70 08/16/2015 1742   CREATININE 0.55 06/12/2015 1801   CALCIUM 9.6 06/12/2015 1801   GFRNONAA >90 01/04/2015 0255   GFRAA >90 01/04/2015 0255    A/P: Hypertension longstanding currently controlled on current medications.  Continued losartan 100 mg daily. Encouraged patient to take esomeprazole daily to get full benefits for GERD as it does not work as well as a PRN medication. Also encouraged weight loss for hip pain and blood pressure control, through eating less [employer] and increased walking.  Results reviewed and written information provided.   Total time in face-to-face counseling 20 minutes.   F/U Clinic Visit with Dr. Armen PickupFunches.  Patient seen with Elta GuadeloupeJustin Crowder, PharmD Candidate

## 2016-04-23 NOTE — Patient Instructions (Signed)
Thanks for coming to see us!  Keep working on eating healthy and exercising more - you can do it!  Take the esomeprazole (Nexium) every day - it doesn't work as well unless you take it every day  Follow up with Dr. Armen PickupFunches within the month.

## 2016-04-25 ENCOUNTER — Ambulatory Visit: Payer: Self-pay | Admitting: Family Medicine

## 2016-04-26 MED FILL — LOSARTAN POTASSIUM 50 MG TA: 50 | 30 days supply | Qty: 30 | Fill #3

## 2016-06-03 MED FILL — LOSARTAN POTASSIUM 50 MG TA: 50 | 30 days supply | Qty: 30 | Fill #4

## 2016-06-21 ENCOUNTER — Ambulatory Visit: Payer: BLUE CROSS/BLUE SHIELD | Attending: Family Medicine | Admitting: Family Medicine

## 2016-06-21 VITALS — BP 129/85 | HR 79 | Temp 98.4°F | Ht 63.0 in | Wt 227.0 lb

## 2016-06-21 DIAGNOSIS — K219 Gastro-esophageal reflux disease without esophagitis: Secondary | ICD-10-CM | POA: Diagnosis not present

## 2016-06-21 DIAGNOSIS — M25512 Pain in left shoulder: Secondary | ICD-10-CM | POA: Diagnosis not present

## 2016-06-21 DIAGNOSIS — I10 Essential (primary) hypertension: Secondary | ICD-10-CM | POA: Insufficient documentation

## 2016-06-21 DIAGNOSIS — M549 Dorsalgia, unspecified: Secondary | ICD-10-CM | POA: Diagnosis present

## 2016-06-21 DIAGNOSIS — M545 Low back pain, unspecified: Secondary | ICD-10-CM

## 2016-06-21 DIAGNOSIS — Z0000 Encounter for general adult medical examination without abnormal findings: Secondary | ICD-10-CM | POA: Diagnosis not present

## 2016-06-21 DIAGNOSIS — H539 Unspecified visual disturbance: Secondary | ICD-10-CM

## 2016-06-21 LAB — COMPLETE METABOLIC PANEL WITH GFR
ALT: 35 U/L — ABNORMAL HIGH (ref 6–29)
AST: 25 U/L (ref 10–30)
Albumin: 4.3 g/dL (ref 3.6–5.1)
Alkaline Phosphatase: 87 U/L (ref 33–115)
BUN: 15 mg/dL (ref 7–25)
CO2: 28 mmol/L (ref 20–31)
Calcium: 9.5 mg/dL (ref 8.6–10.2)
Chloride: 106 mmol/L (ref 98–110)
Creat: 0.68 mg/dL (ref 0.50–1.10)
GFR, Est African American: >89 mL/min (ref >=60)
GFR, Est Non African American: >89 mL/min (ref >=60)
Glucose, Bld: 75 mg/dL (ref 65–99)
Potassium: 4 mmol/L (ref 3.5–5.3)
Sodium: 139 mmol/L (ref 135–146)
Total Bilirubin: 0.6 mg/dL (ref 0.2–1.2)
Total Protein: 8.3 g/dL — ABNORMAL HIGH (ref 6.1–8.1)

## 2016-06-21 LAB — POCT GLYCOSYLATED HEMOGLOBIN (HGB A1C): Hemoglobin A1C: 5.2

## 2016-06-21 MED ORDER — TRAMADOL HCL 50 MG PO TABS: 50.0000 mg | ORAL_TABLET | Freq: Three times a day (TID) | ORAL | 0 refills | Status: DC | PRN

## 2016-06-21 MED ORDER — MELOXICAM 15 MG PO TABS: 15.0000 mg | ORAL_TABLET | Freq: Every day | ORAL | 2 refills | Status: DC

## 2016-06-21 MED FILL — traMADol HCL 50 MG TABS: 50 | 20 days supply | Qty: 60 | Fill #0

## 2016-06-21 MED FILL — MELOXICAM 15 MG TABLET: 15 | 30 days supply | Qty: 30 | Fill #0

## 2016-06-21 NOTE — Patient Instructions (Addendum)
Angela Castillo was seen today for hypertension and back pain.  Diagnoses and all orders for this visit:  Acute back pain less than 4 weeks duration -     COMPLETE METABOLIC PANEL WITH GFR -     meloxicam (MOBIC) 15 MG tablet; Take 1 tablet (15 mg total) by mouth daily. -     traMADol (ULTRAM) 50 MG tablet; Take 1 tablet (50 mg total) by mouth every 8 (eight) hours as needed.  Healthcare maintenance -     Flu Vaccine QUAD 36+ mos IM  Vision changes -     HgB A1c  Bilateral low back pain without sciatica -     meloxicam (MOBIC) 15 MG tablet; Take 1 tablet (15 mg total) by mouth daily. -     traMADol (ULTRAM) 50 MG tablet; Take 1 tablet (50 mg total) by mouth every 8 (eight) hours as needed.    F/u in 3 months sooner if needed for back pain and HTN   Dr. Armen Pickup  Low Back Strain With Rehab A strain is an injury in which a tendon or muscle is torn. The muscles and tendons of the lower back are vulnerable to strains. However, these muscles and tendons are very strong and require a great force to be injured. Strains are classified into three categories. Grade 1 strains cause pain, but the tendon is not lengthened. Grade 2 strains include a lengthened ligament, due to the ligament being stretched or partially ruptured. With grade 2 strains there is still function, although the function may be decreased. Grade 3 strains involve a complete tear of the tendon or muscle, and function is usually impaired. SYMPTOMS   Pain in the lower back.  Pain that affects one side more than the other.  Pain that gets worse with movement and may be felt in the hip, buttocks, or back of the thigh.  Muscle spasms of the muscles in the back.  Swelling along the muscles of the back.  Loss of strength of the back muscles.  Crackling sound (crepitation) when the muscles are touched. CAUSES  Lower back strains occur when a force is placed on the muscles or tendons that is greater than they can handle. Common  causes of injury include:  Prolonged overuse of the muscle-tendon units in the lower back, usually from incorrect posture.  A single violent injury or force applied to the back. RISK INCREASES WITH:  Sports that involve twisting forces on the spine or a lot of bending at the waist (football, rugby, weightlifting, bowling, golf, tennis, speed skating, racquetball, swimming, running, gymnastics, diving).  Poor strength and flexibility.  Failure to warm up properly before activity.  Family history of lower back pain or disk disorders.  Previous back injury or surgery (especially fusion).  Poor posture with lifting, especially heavy objects.  Prolonged sitting, especially with poor posture. PREVENTION   Learn and use proper posture when sitting or lifting (maintain proper posture when sitting, lift using the knees and legs, not at the waist).  Warm up and stretch properly before activity.  Allow for adequate recovery between workouts.  Maintain physical fitness:  Strength, flexibility, and endurance.  Cardiovascular fitness. PROGNOSIS  If treated properly, lower back strains usually heal within 6 weeks. RELATED COMPLICATIONS   Recurring symptoms, resulting in a chronic problem.  Chronic inflammation, scarring, and partial muscle-tendon tear.  Delayed healing or resolution of symptoms.  Prolonged disability. TREATMENT  Treatment first involves the use of ice and medicine, to reduce pain and  inflammation. The use of strengthening and stretching exercises may help reduce pain with activity. These exercises may be performed at home or with a therapist. Severe injuries may require referral to a therapist for further evaluation and treatment, such as ultrasound. Your caregiver may advise that you wear a back brace or corset, to help reduce pain and discomfort. Often, prolonged bed rest results in greater harm then benefit. Corticosteroid injections may be recommended. However,  these should be reserved for the most serious cases. It is important to avoid using your back when lifting objects. At night, sleep on your back on a firm mattress with a pillow placed under your knees. If non-surgical treatment is unsuccessful, surgery may be needed.  MEDICATION   If pain medicine is needed, nonsteroidal anti-inflammatory medicines (aspirin and ibuprofen), or other minor pain relievers (acetaminophen), are often advised.  Do not take pain medicine for 7 days before surgery.  Prescription pain relievers may be given, if your caregiver thinks they are needed. Use only as directed and only as much as you need.  Ointments applied to the skin may be helpful.  Corticosteroid injections may be given by your caregiver. These injections should be reserved for the most serious cases, because they may only be given a certain number of times. HEAT AND COLD  Cold treatment (icing) should be applied for 10 to 15 minutes every 2 to 3 hours for inflammation and pain, and immediately after activity that aggravates your symptoms. Use ice packs or an ice massage.  Heat treatment may be used before performing stretching and strengthening activities prescribed by your caregiver, physical therapist, or athletic trainer. Use a heat pack or a warm water soak. SEEK MEDICAL CARE IF:   Symptoms get worse or do not improve in 2 to 4 weeks, despite treatment.  You develop numbness, weakness, or loss of bowel or bladder function.  New, unexplained symptoms develop. (Drugs used in treatment may produce side effects.) EXERCISES  RANGE OF MOTION (ROM) AND STRETCHING EXERCISES - Low Back Strain Most people with lower back pain will find that their symptoms get worse with excessive bending forward (flexion) or arching at the lower back (extension). The exercises which will help resolve your symptoms will focus on the opposite motion.  Your physician, physical therapist or athletic trainer will help you  determine which exercises will be most helpful to resolve your lower back pain. Do not complete any exercises without first consulting with your caregiver. Discontinue any exercises which make your symptoms worse until you speak to your caregiver.  If you have pain, numbness or tingling which travels down into your buttocks, leg or foot, the goal of the therapy is for these symptoms to move closer to your back and eventually resolve. Sometimes, these leg symptoms will get better, but your lower back pain may worsen. This is typically an indication of progress in your rehabilitation. Be very alert to any changes in your symptoms and the activities in which you participated in the 24 hours prior to the change. Sharing this information with your caregiver will allow him/her to most efficiently treat your condition.  These exercises may help you when beginning to rehabilitate your injury. Your symptoms may resolve with or without further involvement from your physician, physical therapist or athletic trainer. While completing these exercises, remember:  Restoring tissue flexibility helps normal motion to return to the joints. This allows healthier, less painful movement and activity.  An effective stretch should be held for at least 30  seconds.  A stretch should never be painful. You should only feel a gentle lengthening or release in the stretched tissue. FLEXION RANGE OF MOTION AND STRETCHING EXERCISES: STRETCH - Flexion, Single Knee to Chest   Lie on a firm bed or floor with both legs extended in front of you.  Keeping one leg in contact with the floor, bring your opposite knee to your chest. Hold your leg in place by either grabbing behind your thigh or at your knee.  Pull until you feel a gentle stretch in your lower back. Hold __________ seconds.  Slowly release your grasp and repeat the exercise with the opposite side. Repeat __________ times. Complete this exercise __________ times per day.   STRETCH - Flexion, Double Knee to Chest   Lie on a firm bed or floor with both legs extended in front of you.  Keeping one leg in contact with the floor, bring your opposite knee to your chest.  Tense your stomach muscles to support your back and then lift your other knee to your chest. Hold your legs in place by either grabbing behind your thighs or at your knees.  Pull both knees toward your chest until you feel a gentle stretch in your lower back. Hold __________ seconds.  Tense your stomach muscles and slowly return one leg at a time to the floor. Repeat __________ times. Complete this exercise __________ times per day.  STRETCH - Low Trunk Rotation  Lie on a firm bed or floor. Keeping your legs in front of you, bend your knees so they are both pointed toward the ceiling and your feet are flat on the floor.  Extend your arms out to the side. This will stabilize your upper body by keeping your shoulders in contact with the floor.  Gently and slowly drop both knees together to one side until you feel a gentle stretch in your lower back. Hold for __________ seconds.  Tense your stomach muscles to support your lower back as you bring your knees back to the starting position. Repeat the exercise to the other side. Repeat __________ times. Complete this exercise __________ times per day  EXTENSION RANGE OF MOTION AND FLEXIBILITY EXERCISES: STRETCH - Extension, Prone on Elbows   Lie on your stomach on the floor, a bed will be too soft. Place your palms about shoulder width apart and at the height of your head.  Place your elbows under your shoulders. If this is too painful, stack pillows under your chest.  Allow your body to relax so that your hips drop lower and make contact more completely with the floor.  Hold this position for __________ seconds.  Slowly return to lying flat on the floor. Repeat __________ times. Complete this exercise __________ times per day.  RANGE OF MOTION  - Extension, Prone Press Ups  Lie on your stomach on the floor, a bed will be too soft. Place your palms about shoulder width apart and at the height of your head.  Keeping your back as relaxed as possible, slowly straighten your elbows while keeping your hips on the floor. You may adjust the placement of your hands to maximize your comfort. As you gain motion, your hands will come more underneath your shoulders.  Hold this position __________ seconds.  Slowly return to lying flat on the floor. Repeat __________ times. Complete this exercise __________ times per day.  RANGE OF MOTION- Quadruped, Neutral Spine   Assume a hands and knees position on a firm surface. Keep your  hands under your shoulders and your knees under your hips. You may place padding under your knees for comfort.  Drop your head and point your tail bone toward the ground below you. This will round out your lower back like an angry cat. Hold this position for __________ seconds.  Slowly lift your head and release your tail bone so that your back sags into a large arch, like an old horse.  Hold this position for __________ seconds.  Repeat this until you feel limber in your lower back.  Now, find your "sweet spot." This will be the most comfortable position somewhere between the two previous positions. This is your neutral spine. Once you have found this position, tense your stomach muscles to support your lower back.  Hold this position for __________ seconds. Repeat __________ times. Complete this exercise __________ times per day.  STRENGTHENING EXERCISES - Low Back Strain These exercises may help you when beginning to rehabilitate your injury. These exercises should be done near your "sweet spot." This is the neutral, low-back arch, somewhere between fully rounded and fully arched, that is your least painful position. When performed in this safe range of motion, these exercises can be used for people who have either a  flexion or extension based injury. These exercises may resolve your symptoms with or without further involvement from your physician, physical therapist or athletic trainer. While completing these exercises, remember:   Muscles can gain both the endurance and the strength needed for everyday activities through controlled exercises.  Complete these exercises as instructed by your physician, physical therapist or athletic trainer. Increase the resistance and repetitions only as guided.  You may experience muscle soreness or fatigue, but the pain or discomfort you are trying to eliminate should never worsen during these exercises. If this pain does worsen, stop and make certain you are following the directions exactly. If the pain is still present after adjustments, discontinue the exercise until you can discuss the trouble with your caregiver. STRENGTHENING - Deep Abdominals, Pelvic Tilt  Lie on a firm bed or floor. Keeping your legs in front of you, bend your knees so they are both pointed toward the ceiling and your feet are flat on the floor.  Tense your lower abdominal muscles to press your lower back into the floor. This motion will rotate your pelvis so that your tail bone is scooping upwards rather than pointing at your feet or into the floor.  With a gentle tension and even breathing, hold this position for __________ seconds. Repeat __________ times. Complete this exercise __________ times per day.  STRENGTHENING - Abdominals, Crunches   Lie on a firm bed or floor. Keeping your legs in front of you, bend your knees so they are both pointed toward the ceiling and your feet are flat on the floor. Cross your arms over your chest.  Slightly tip your chin down without bending your neck.  Tense your abdominals and slowly lift your trunk high enough to just clear your shoulder blades. Lifting higher can put excessive stress on the lower back and does not further strengthen your abdominal  muscles.  Control your return to the starting position. Repeat __________ times. Complete this exercise __________ times per day.  STRENGTHENING - Quadruped, Opposite UE/LE Lift   Assume a hands and knees position on a firm surface. Keep your hands under your shoulders and your knees under your hips. You may place padding under your knees for comfort.  Find your neutral spine and gently  tense your abdominal muscles so that you can maintain this position. Your shoulders and hips should form a rectangle that is parallel with the floor and is not twisted.  Keeping your trunk steady, lift your right hand no higher than your shoulder and then your left leg no higher than your hip. Make sure you are not holding your breath. Hold this position __________ seconds.  Continuing to keep your abdominal muscles tense and your back steady, slowly return to your starting position. Repeat with the opposite arm and leg. Repeat __________ times. Complete this exercise __________ times per day.  STRENGTHENING - Lower Abdominals, Double Knee Lift  Lie on a firm bed or floor. Keeping your legs in front of you, bend your knees so they are both pointed toward the ceiling and your feet are flat on the floor.  Tense your abdominal muscles to brace your lower back and slowly lift both of your knees until they come over your hips. Be certain not to hold your breath.  Hold __________ seconds. Using your abdominal muscles, return to the starting position in a slow and controlled manner. Repeat __________ times. Complete this exercise __________ times per day.  POSTURE AND BODY MECHANICS CONSIDERATIONS - Low Back Strain Keeping correct posture when sitting, standing or completing your activities will reduce the stress put on different body tissues, allowing injured tissues a chance to heal and limiting painful experiences. The following are general guidelines for improved posture. Your physician or physical therapist will  provide you with any instructions specific to your needs. While reading these guidelines, remember:  The exercises prescribed by your provider will help you have the flexibility and strength to maintain correct postures.  The correct posture provides the best environment for your joints to work. All of your joints have less wear and tear when properly supported by a spine with good posture. This means you will experience a healthier, less painful body.  Correct posture must be practiced with all of your activities, especially prolonged sitting and standing. Correct posture is as important when doing repetitive low-stress activities (typing) as it is when doing a single heavy-load activity (lifting). RESTING POSITIONS Consider which positions are most painful for you when choosing a resting position. If you have pain with flexion-based activities (sitting, bending, stooping, squatting), choose a position that allows you to rest in a less flexed posture. You would want to avoid curling into a fetal position on your side. If your pain worsens with extension-based activities (prolonged standing, working overhead), avoid resting in an extended position such as sleeping on your stomach. Most people will find more comfort when they rest with their spine in a more neutral position, neither too rounded nor too arched. Lying on a non-sagging bed on your side with a pillow between your knees, or on your back with a pillow under your knees will often provide some relief. Keep in mind, being in any one position for a prolonged period of time, no matter how correct your posture, can still lead to stiffness. PROPER SITTING POSTURE In order to minimize stress and discomfort on your spine, you must sit with correct posture. Sitting with good posture should be effortless for a healthy body. Returning to good posture is a gradual process. Many people can work toward this most comfortably by using various supports until they  have the flexibility and strength to maintain this posture on their own. When sitting with proper posture, your ears will fall over your shoulders and your shoulders  will fall over your hips. You should use the back of the chair to support your upper back. Your lower back will be in a neutral position, just slightly arched. You may place a small pillow or folded towel at the base of your lower back for support.  When working at a desk, create an environment that supports good, upright posture. Without extra support, muscles tire, which leads to excessive strain on joints and other tissues. Keep these recommendations in mind: CHAIR:  A chair should be able to slide under your desk when your back makes contact with the back of the chair. This allows you to work closely.  The chair's height should allow your eyes to be level with the upper part of your monitor and your hands to be slightly lower than your elbows. BODY POSITION  Your feet should make contact with the floor. If this is not possible, use a foot rest.  Keep your ears over your shoulders. This will reduce stress on your neck and lower back. INCORRECT SITTING POSTURES  If you are feeling tired and unable to assume a healthy sitting posture, do not slouch or slump. This puts excessive strain on your back tissues, causing more damage and pain. Healthier options include:  Using more support, like a lumbar pillow.  Switching tasks to something that requires you to be upright or walking.  Talking a brief walk.  Lying down to rest in a neutral-spine position. PROLONGED STANDING WHILE SLIGHTLY LEANING FORWARD  When completing a task that requires you to lean forward while standing in one place for a long time, place either foot up on a stationary 2-4 inch high object to help maintain the best posture. When both feet are on the ground, the lower back tends to lose its slight inward curve. If this curve flattens (or becomes too large), then  the back and your other joints will experience too much stress, tire more quickly, and can cause pain. CORRECT STANDING POSTURES Proper standing posture should be assumed with all daily activities, even if they only take a few moments, like when brushing your teeth. As in sitting, your ears should fall over your shoulders and your shoulders should fall over your hips. You should keep a slight tension in your abdominal muscles to brace your spine. Your tailbone should point down to the ground, not behind your body, resulting in an over-extended swayback posture.  INCORRECT STANDING POSTURES  Common incorrect standing postures include a forward head, locked knees and/or an excessive swayback. WALKING Walk with an upright posture. Your ears, shoulders and hips should all line-up. PROLONGED ACTIVITY IN A FLEXED POSITION When completing a task that requires you to bend forward at your waist or lean over a low surface, try to find a way to stabilize 3 out of 4 of your limbs. You can place a hand or elbow on your thigh or rest a knee on the surface you are reaching across. This will provide you more stability so that your muscles do not fatigue as quickly. By keeping your knees relaxed, or slightly bent, you will also reduce stress across your lower back. CORRECT LIFTING TECHNIQUES DO :   Assume a wide stance. This will provide you more stability and the opportunity to get as close as possible to the object which you are lifting.  Tense your abdominals to brace your spine. Bend at the knees and hips. Keeping your back locked in a neutral-spine position, lift using your leg muscles. Lift with  your legs, keeping your back straight.  Test the weight of unknown objects before attempting to lift them.  Try to keep your elbows locked down at your sides in order get the best strength from your shoulders when carrying an object.  Always ask for help when lifting heavy or awkward objects. INCORRECT LIFTING  TECHNIQUES DO NOT:   Lock your knees when lifting, even if it is a small object.  Bend and twist. Pivot at your feet or move your feet when needing to change directions.  Assume that you can safely pick up even a paper clip without proper posture.   This information is not intended to replace advice given to you by your health care provider. Make sure you discuss any questions you have with your health care provider.   Document Released: 10/21/2005 Document Revised: 11/11/2014 Document Reviewed: 02/02/2009 [employer] Interactive Patient Education 2016 [employer]  Mid-Back Strain With Rehab  A strain is an injury in which a tendon or muscle is torn. The muscles and tendons of the mid-back are vulnerable to strains. However, these muscles and tendons are very strong and require a great force to be injured. The muscles of the mid-back are responsible for stabilizing the spinal column, as well as spinal twisting (rotation). Strains are classified into three categories. Grade 1 strains cause pain, but the tendon is not lengthened. Grade 2 strains include a lengthened ligament, due to the ligament being stretched or partially ruptured. With grade 2 strains there is still function, although the function may be decreased. Grade 3 strains involve a complete tear of the tendon or muscle, and function is usually impaired. SYMPTOMS   Pain in the middle of the back.  Pain that may affect only one side, and is worse with movement.  Muscle spasms, and often swelling in the back.  Loss of strength of the back muscles.  Crackling sound (crepitation) when the muscles are touched. CAUSES  Mid-back strains occur when a force is placed on the muscles or tendons that is greater than they can handle. Common causes of injury include:  Ongoing overuse of the muscle-tendon units in the middle back, usually from incorrect body posture.  A single violent injury or force applied to the back. RISK INCREASES  WITH:  Sports that involve twisting forces on the spine or a lot of bending at the waist (football, rugby, weightlifting, bowling, golf, tennis, speed skating, racquetball, swimming, running, gymnastics, diving).  Poor strength and flexibility.  Failure to warm up properly before activity.  Family history of low back pain or disk disorders.  Previous back injury or surgery (especially fusion). PREVENTION  Learn and use proper sports technique.  Warm up and stretch properly before activity.  Allow for adequate recovery between workouts.  Maintain physical fitness:  Strength, flexibility, and endurance.  Cardiovascular fitness. PROGNOSIS  If treated properly, mid-back strains usually heal within 6 weeks. RELATED COMPLICATIONS   Frequently recurring symptoms, resulting in a chronic problem. Properly treating the problem the first time decreases frequency of recurrence.  Chronic inflammation, scarring, and partial muscle-tendon tear.  Delayed healing or resolution of symptoms, especially if activity is resumed too soon.  Prolonged disability. TREATMENT Treatment first involves the use of ice and medicine, to reduce pain and inflammation. As the pain begins to subside, you may begin strengthening and stretching exercises to improve body posture and sport technique. These exercises may be performed at home or with a therapist. Severe injuries may require referral to a therapist for  further evaluation and treatment, such as ultrasound. Corticosteroid injections may be given to help reduce inflammation. Biofeedback (watching monitors of your body processes) and psychotherapy may also be prescribed. Prolonged bed rest is felt to do more harm than good. Massage may help break the muscle spasms. Sometimes, an injection of cortisone, with or without local anesthetics, may be given to help relieve the pain and spasms. MEDICATION   If pain medicine is needed, nonsteroidal anti-inflammatory  medicines (aspirin and ibuprofen), or other minor pain relievers (acetaminophen), are often advised.  Do not take pain medicine for 7 days before surgery.  Prescription pain relievers may be given, if your caregiver thinks they are needed. Use only as directed and only as much as you need.  Ointments applied to the skin may be helpful.  Corticosteroid injections may be given by your caregiver. These injections should be reserved for the most serious cases, because they may only be given a certain number of times. HEAT AND COLD:   Cold treatment (icing) should be applied for 10 to 15 minutes every 2 to 3 hours for inflammation and pain, and immediately after activity that aggravates your symptoms. Use ice packs or an ice massage.  Heat treatment may be used before performing stretching and strengthening activities prescribed by your caregiver, physical therapist, or athletic trainer. Use a heat pack or a warm water soak. SEEK IMMEDIATE MEDICAL CARE IF:  Symptoms get worse or do not improve in 2 to 4 weeks, despite treatment.  You develop numbness, weakness, or loss of bowel or bladder function.  New, unexplained symptoms develop. (Drugs used in treatment may produce side effects.) EXERCISES RANGE OF MOTION (ROM) AND STRETCHING EXERCISES - Mid-Back Strain These exercises may help you when beginning to rehabilitate your injury. In order to successfully resolve your symptoms, you must improve your posture. These exercises are designed to help reduce the forward-head and rounded-shoulder posture which contributes to this condition. Your symptoms may resolve with or without further involvement from your physician, physical therapist or athletic trainer. While completing these exercises, remember:   Restoring tissue flexibility helps normal motion to return to the joints. This allows healthier, less painful movement and activity.  An effective stretch should be held for at least 30 seconds.  A  stretch should never be painful. You should only feel a gentle lengthening or release in the stretched tissue. STRETCH - Axial Extension  Stand or sit on a firm surface. Assume a good posture: chest up, shoulders drawn back, stomach muscles slightly tense, knees unlocked (if standing) and feet hip width apart.  Slowly retract your chin, so your head slides back and your chin slightly lowers. Continue to look straight ahead.  You should feel a gentle stretch in the back of your head. Be certain not to feel an aggressive stretch since this can cause headaches later.  Hold for __________ seconds. Repeat __________ times. Complete this exercise __________ times per day. RANGE OF MOTION- Upper Thoracic Extension  Sit on a firm chair with a high back. Assume a good posture: chest up, shoulders drawn back, abdominal muscles slightly tense, and feet hip width apart. Place a small pillow or folded towel in the curve of your lower back, if you are having difficulty maintaining good posture.  Gently brace your neck with your hands, allowing your arms to rest on your chest.  Continue to support your neck and slowly extend your back over the chair. You will feel a stretch across your upper back.  Hold __________ seconds. Slowly return to the starting position. Repeat __________ times. Complete this exercise __________ times per day. RANGE OF MOTION- Mid-Thoracic Extension  Roll a towel so that it is about 4 inches in diameter.  Position the towel lengthwise. Lay on the towel so that your spine, but not your shoulder blades, are supported.  You should feel your mid-back arching toward the floor. To increase the stretch, extend your arms away from your body.  Hold for __________ seconds. Repeat exercise __________ times, __________ times per day. STRENGTHENING EXERCISES - Mid-Back Strain These exercises may help you when beginning to rehabilitate your injury. They may resolve your symptoms with or  without further involvement from your physician, physical therapist or athletic trainer. While completing these exercises, remember:   Muscles can gain both the endurance and the strength needed for everyday activities through controlled exercises.  Complete these exercises as instructed by your physician, physical therapist or athletic trainer. Increase the resistance and repetitions only as guided by your caregiver.  You may experience muscle soreness or fatigue, but the pain or discomfort you are trying to eliminate should never worsen during these exercises. If this pain does worsen, stop and make certain you are following the directions exactly. If the pain is still present after adjustments, discontinue the exercise until you can discuss the trouble with your caregiver. STRENGTHENING - Quadruped, Opposite UE/LE Lift  Assume a hands and knees position on a firm surface. Keep your hands under your shoulders and your knees under your hips. You may place padding under your knees for comfort.  Find your neutral spine and gently tense your abdominal muscles so that you can maintain this position. Your shoulders and hips should form a rectangle that is parallel with the floor and is not twisted.  Keeping your trunk steady, lift your right hand no higher than your shoulder and then your left leg no higher than your hip. Make sure you are not holding your breath. Hold this position __________ seconds.  Continuing to keep your abdominal muscles tense and your back steady, slowly return to your starting position. Repeat with the opposite arm and leg. Repeat __________ times. Complete this exercise __________ times per day.  STRENGTH - Shoulder Extensors  Secure a rubber exercise band or tubing to a fixed object (table, pole) so that it is at the height of your shoulders when you are either standing, or sitting on a firm armless chair.  With a thumbs-up grip, grasp an end of the band in each hand.  Straighten your elbows and lift your hands straight in front of you at shoulder height. Step back away from the secured end of band, until it becomes tense.  Squeezing your shoulder blades together, pull your hands down to the sides of your thighs. Do not allow your hands to go behind you.  Hold for __________ seconds. Slowly ease the tension on the band, as you reverse the directions and return to the starting position. Repeat __________ times. Complete this exercise __________ times per day.  STRENGTH - Horizontal Abductors Choose one of the two positions to complete this exercise. Prone: lying on stomach:  Lie on your stomach on a firm surface so that your right / left arm overhangs the edge. Rest your forehead on your opposite forearm. With your palm facing the floor and your elbow straight, hold a __________ weight in your hand.  Squeeze your right / left shoulder blade to your mid-back spine and then slowly raise your  arm to the height of the bed.  Hold for __________ seconds. Slowly reverse the directions and return to the starting position, controlling the weight as you lower your arm. Repeat __________ times. Complete this exercise __________ times per day. Standing:   Secure a rubber exercise band or tubing, so that it is at the height of your shoulders when you are either standing, or sitting on a firm armless chair.  Grasp an end of the band in each hand and have your palms face each other. Straighten your elbows and lift your hands straight in front of you at shoulder height. Step back away from the secured end of band, until it becomes tense.  Squeeze your shoulder blades together. Keeping your elbows locked and your hands at shoulder height, spread your arms apart, forming a "T" shape with your body. Hold __________ seconds. Slowly ease the tension on the band, as you reverse the directions and return to the starting position. Repeat __________ times. Complete this exercise  __________ times per day. STRENGTH - Scapular Retractors and External Rotators, Rowing  Secure a rubber exercise band or tubing, so that it is at the height of your shoulders when you are either standing, or sitting on a firm armless chair.  With a palm-down grip, grasp an end of the band in each hand. Straighten your elbows and lift your hands straight in front of you at shoulder height. Step back away from the secured end of band, until it becomes tense.  Step 1: Squeeze your shoulder blades together. Bending your elbows, draw your hands to your chest as if you are rowing a boat. At the end of this motion, your hands and elbow should be at shoulder height and your elbows should be out to your sides.  Step 2: Rotate your shoulder to raise your hands above your head. Your forearms should be vertical and your upper arms should be horizontal.  Hold for __________ seconds. Slowly ease the tension on the band, as you reverse the directions and return to the starting position. Repeat __________ times. Complete this exercise __________ times per day.  POSTURE AND BODY MECHANICS CONSIDERATIONS - Mid-Back Strain Keeping correct posture when sitting, standing or completing your activities will reduce the stress put on different body tissues, allowing injured tissues a chance to heal and limiting painful experiences. The following are general guidelines for improved posture. Your physician or physical therapist will provide you with any instructions specific to your needs. While reading these guidelines, remember:  The exercises prescribed by your provider will help you have the flexibility and strength to maintain correct postures.  The correct posture provides the best environment for your joints to work. All of your joints have less wear and tear when properly supported by a spine with good posture. This means you will experience a healthier, less painful body.  Correct posture must be practiced with  all of your activities, especially prolonged sitting and standing. Correct posture is as important when doing repetitive low-stress activities (typing) as it is when doing a single heavy-load activity (lifting). PROPER SITTING POSTURE In order to minimize stress and discomfort on your spine, you must sit with correct posture. Sitting with good posture should be effortless for a healthy body. Returning to good posture is a gradual process. Many people can work toward this most comfortably by using various supports until they have the flexibility and strength to maintain this posture on their own. When sitting with proper posture, your ears will fall  over your shoulders and your shoulders will fall over your hips. You should use the back of the chair to support your upper back. Your lower back will be in a neutral position, just slightly arched. You may place a small pillow or folded towel at the base of your low back for  support.  When working at a desk, create an environment that supports good, upright posture. Without extra support, muscles fatigue and lead to excessive strain on joints and other tissues. Keep these recommendations in mind: CHAIR:  A chair should be able to slide under your desk when your back makes contact with the back of the chair. This allows you to work closely.  The chair's height should allow your eyes to be level with the upper part of your monitor and your hands to be slightly lower than your elbows. BODY POSITION  Your feet should make contact with the floor. If this is not possible, use a foot rest.  Keep your ears over your shoulders. This will reduce stress on your neck and lower back. INCORRECT SITTING POSTURES If you are feeling tired and unable to assume a healthy sitting posture, do not slouch or slump. This puts excessive strain on your back tissues, causing more damage and pain. Healthier options include:  Using more support, like a lumbar pillow.  Switching  tasks to something that requires you to be upright or walking.  Talking a brief walk.  Lying down to rest in a neutral-spine position. CORRECT STANDING POSTURES Proper standing posture should be assumed with all daily activities, even if they only take a few moments, like when brushing your teeth. As in sitting, your ears should fall over your shoulders and your shoulders should fall over your hips. You should keep a slight tension in your abdominal muscles to brace your spine. Your tailbone should point down to the ground, not behind your body, resulting in an over-extended swayback posture.  INCORRECT STANDING POSTURES Common incorrect standing postures include a forward head, locked knees, and an excessive swayback. WALKING Walk with an upright posture. Your ears, shoulders and hips should all line-up. CORRECT LIFTING TECHNIQUES DO :   Assume a wide stance. This will provide you more stability and the opportunity to get as close as possible to the object which you are lifting.  Tense your abdominals to brace your spine. Bend at the knees and hips. Keeping your back locked in a neutral-spine position, lift using your leg muscles. Lift with your legs, keeping your back straight.  Test the weight of unknown objects before attempting to lift them.  Try to keep your elbows locked down at your sides in order get the best strength from your shoulders when carrying an object.  Always ask for help when lifting heavy or awkward objects. INCORRECT LIFTING TECHNIQUES DO NOT:   Lock your knees when lifting, even if it is a small object.  Bend and twist. Pivot at your feet or move your feet when needing to change directions.  Assume that you can safely pick up even a paperclip without proper posture.   This information is not intended to replace advice given to you by your health care provider. Make sure you discuss any questions you have with your health care provider.   Document Released:  10/21/2005 Document Revised: 03/07/2015 Document Reviewed: 02/02/2009 Elsevier Interactive Patient Education [employer]

## 2016-06-21 NOTE — Progress Notes (Signed)
Subjective:  Patient ID: Angela Castillo, femaleTamala Castillo    DOB: April 06, 1976  Age: 40 y.o. MRN: [id number]  CC: Hypertension and Back Pain   HPI Angela FothergillMichelle D Foye presents for    1.  GERD: has hx of GERD. Took nexium and prevacid in the past. nexium worked best. Now with heart burn 2-3 times per week. She tries to avoid food triggers. She take naproxen or ibuprofen prn hip, back and shoulder pain. She does not drink ETOH.   2. HTN: taking losartan. No HA, CP, SOB or leg swelling.   3.  L shoulder pain: for past month. No trauma. Comes and goes. Worse with raising arm. Lateral and anterior shoulder pain. No weakness in L hand.   4. Back pain: started nearly 2 weeks ago with pain in entire back that woke her up from sleep. She could not move or take a deep breath. She took tramadol and mobic which helped in back pain within 30 minutes. Her back has been in dull achy pain since then. It hurts most with standing. Some pain with sitting. No recent heavy lifting, no new activity at work.   Social History  Substance Use Topics  . Smoking status: Never Smoker  . Smokeless tobacco: Not on file  . Alcohol use Yes     Comment: rarely    Outpatient Medications Prior to Visit  Medication Sig Dispense Refill  . ferrous sulfate 325 (65 FE) MG tablet Take 325 mg by mouth 2 (two) times daily with a meal.    . fluticasone (FLONASE) 50 MCG/ACT nasal spray Place 2 sprays into both nostrils daily. (Patient taking differently: Place 2 sprays into both nostrils daily as needed for allergies or rhinitis. ) 16 g 6  . losartan (COZAAR) 100 MG tablet Take 1 tablet (100 mg total) by mouth daily. 30 tablet 5  . meloxicam (MOBIC) 15 MG tablet Take 1 tablet (15 mg total) by mouth daily. 30 tablet 0  . Multiple Vitamins-Minerals (HAIR SKIN AND NAILS FORMULA PO) Take 2 each by mouth every morning. Reported on 03/18/2016    . naproxen (NAPROSYN) 500 MG tablet Take 1 tablet (500 mg total) by mouth 2 (two) times  daily. 14 tablet 0  . traMADol (ULTRAM) 50 MG tablet Take 1 tablet (50 mg total) by mouth every 8 (eight) hours as needed. 60 tablet 0  . esomeprazole (NEXIUM) 40 MG capsule Take 1 capsule (40 mg total) by mouth daily. (Patient not taking: Reported on 06/21/2016) 30 capsule 3   No facility-administered medications prior to visit.     ROS Review of Systems  Constitutional: Negative for chills and fever.  Eyes: Positive for visual disturbance.  Respiratory: Negative for shortness of breath.   Cardiovascular: Positive for chest pain (sharp burning pain in L chest lateral to sternum ).  Gastrointestinal: Negative for abdominal pain and blood in stool.  Genitourinary: Negative for flank pain.  Musculoskeletal: Positive for arthralgias.  Skin: Negative for rash.  Allergic/Immunologic: Negative for immunocompromised state.  Hematological: Negative for adenopathy. Does not bruise/bleed easily.  Psychiatric/Behavioral: Negative for dysphoric mood and suicidal ideas.    Objective:  BP 129/85 (BP Location: Left Arm, Patient Position: Sitting, Cuff Size: Large)   Pulse 79   Temp 98.4 F (36.9 C) (Oral)   Ht 5\' 3"  (1.6 m)   Wt 227 lb (103 kg)   LMP 06/13/2016   SpO2 97%   BMI 40.21 kg/m   BP/Weight 06/21/2016 04/23/2016 03/18/2016  Systolic BP  129 135 132  Diastolic BP 85 91 86  Wt. (Lbs) 227 - 229  BMI 40.21 - 40.58   Physical Exam  Constitutional: She is oriented to person, place, and time. She appears well-developed and well-nourished. No distress.  HENT:  Head: Normocephalic and atraumatic.  Cardiovascular: Normal rate, regular rhythm, normal heart sounds and intact distal pulses.   Pulmonary/Chest: Effort normal and breath sounds normal.  Musculoskeletal: She exhibits no edema.       Left shoulder: Normal.  Back Exam: Back: Normal Curvature, no deformities or CVA tenderness  Paraspinal Tenderness: lumbar   LE Strength 5/5  LE Sensation: in tact  LE Reflexes 2+ and symmetric    Straight leg raise: negative    Neurological: She is alert and oriented to person, place, and time.  Skin: Skin is warm and dry. No rash noted.  Psychiatric: She has a normal mood and affect.   Lab Results  Component Value Date   HGBA1C 5.50 06/12/2015    Assessment & Plan:   There are no diagnoses linked to this encounter. Marcelino DusterMichelle was seen today for hypertension and back pain.  Diagnoses and all orders for this visit:  Acute back pain less than 4 weeks duration -     COMPLETE METABOLIC PANEL WITH GFR -     meloxicam (MOBIC) 15 MG tablet; Take 1 tablet (15 mg total) by mouth daily. -     traMADol (ULTRAM) 50 MG tablet; Take 1 tablet (50 mg total) by mouth every 8 (eight) hours as needed.  Healthcare maintenance -     Flu Vaccine QUAD 36+ mos IM  Vision changes -     HgB A1c  Bilateral low back pain without sciatica -     meloxicam (MOBIC) 15 MG tablet; Take 1 tablet (15 mg total) by mouth daily. -     traMADol (ULTRAM) 50 MG tablet; Take 1 tablet (50 mg total) by mouth every 8 (eight) hours as needed.   No orders of the defined types were placed in this encounter.   Follow-up: No Follow-up on file.   Dessa PhiJosalyn Tyeesha Riker MD

## 2016-06-24 ENCOUNTER — Encounter: Payer: Self-pay | Admitting: Family Medicine

## 2016-06-24 NOTE — Assessment & Plan Note (Addendum)
MSK lumbar back pain without red flags in obese patient with hx of back pain Refilled mobic and tramadol Work on weight loss  CMP with check renal function

## 2016-06-24 NOTE — Assessment & Plan Note (Signed)
Controlled Meds: compliant Continue current regimen

## 2016-06-27 MED FILL — LOSARTAN POTASSIUM 50 MG TA: 50 | 30 days supply | Qty: 30 | Fill #5

## 2016-07-29 ENCOUNTER — Other Ambulatory Visit: Payer: Self-pay | Admitting: Family Medicine

## 2016-07-29 DIAGNOSIS — I10 Essential (primary) hypertension: Secondary | ICD-10-CM

## 2016-07-30 MED FILL — LOSARTAN POTASSIUM 50 MG TA: 50 | 30 days supply | Qty: 30 | Fill #0 | Status: TO

## 2016-09-03 MED FILL — LOSARTAN POTASSIUM 50 MG TA: 50 | 30 days supply | Qty: 30 | Fill #1 | Status: TO

## 2016-09-11 MED FILL — MELOXICAM 15 MG TABLET: 15 | 30 days supply | Qty: 30 | Fill #1

## 2016-09-12 ENCOUNTER — Telehealth: Payer: Self-pay | Admitting: Family Medicine

## 2016-09-12 NOTE — Telephone Encounter (Signed)
Pt calling in regards to her Losartan Rx Pt states in the computer it says to take 50 mg but pt remembers PCP instructing to take 110m Pt wanting clarification

## 2016-09-13 NOTE — Telephone Encounter (Signed)
Pt returning call from nurse

## 2016-11-11 ENCOUNTER — Encounter: Payer: Self-pay | Admitting: Family Medicine

## 2016-11-11 ENCOUNTER — Ambulatory Visit: Payer: BLUE CROSS/BLUE SHIELD | Attending: Family Medicine | Admitting: Family Medicine

## 2016-11-11 VITALS — BP 165/95 | HR 78 | Temp 97.9°F | Ht 63.0 in | Wt 241.0 lb

## 2016-11-11 DIAGNOSIS — Z6841 Body Mass Index (BMI) 40.0 and over, adult: Secondary | ICD-10-CM | POA: Diagnosis not present

## 2016-11-11 DIAGNOSIS — E669 Obesity, unspecified: Secondary | ICD-10-CM

## 2016-11-11 DIAGNOSIS — I10 Essential (primary) hypertension: Secondary | ICD-10-CM | POA: Diagnosis not present

## 2016-11-11 DIAGNOSIS — M545 Low back pain, unspecified: Secondary | ICD-10-CM

## 2016-11-11 DIAGNOSIS — G8929 Other chronic pain: Secondary | ICD-10-CM

## 2016-11-11 DIAGNOSIS — Z79899 Other long term (current) drug therapy: Secondary | ICD-10-CM | POA: Insufficient documentation

## 2016-11-11 MED ORDER — LOSARTAN POTASSIUM 100 MG PO TABS: 100.0000 mg | ORAL_TABLET | Freq: Every day | ORAL | 5 refills | Status: DC

## 2016-11-11 MED ORDER — MELOXICAM 15 MG PO TABS: 15.0000 mg | ORAL_TABLET | Freq: Every day | ORAL | 5 refills | Status: DC

## 2016-11-11 MED ORDER — LOSARTAN POTASSIUM-HCTZ 50-12.5 MG PO TABS: 1.0000 | ORAL_TABLET | Freq: Every day | ORAL | 5 refills | Status: DC

## 2016-11-11 NOTE — Patient Instructions (Addendum)
Angela Castillo was seen today for hypertension.  Diagnoses and all orders for this visit:  Essential hypertension -     Discontinue: losartan (COZAAR) 100 MG tablet; Take 1 tablet (100 mg total) by mouth daily. -     Discontinue: losartan-hydrochlorothiazide (HYZAAR) 50-12.5 MG tablet; Take 1 tablet by mouth daily. -     Discontinue: losartan-hydrochlorothiazide (HYZAAR) 50-12.5 MG tablet; Take 1 tablet by mouth daily. -     losartan-hydrochlorothiazide (HYZAAR) 50-12.5 MG tablet; Take 1 tablet by mouth daily.  Chronic bilateral low back pain without sciatica -     meloxicam (MOBIC) 15 MG tablet; Take 1 tablet (15 mg total) by mouth daily.   F/u in 6 weeks for HTN   Dr. Armen PickupFunches

## 2016-11-11 NOTE — Assessment & Plan Note (Signed)
Obesity due to lack of exercise and poor diet  Plan: Advised patient fast from added sugar for next 21 days to jump start weight loss and to gain control of sugar intake Increase exercise

## 2016-11-11 NOTE — Progress Notes (Signed)
Pt is here to follow up on HTN. Pt has questions about Losartan medication.

## 2016-11-11 NOTE — Progress Notes (Signed)
Subjective:  Patient ID: Angela Castillo, female    DOB: 01/26/1976  Age: 41 y.o. MRN: [id number]  CC: Hypertension   HPI Angela Castillo presents for   1. Obesity: she has gained weight and is discouraged by this. She works at [employer]. She drinks sugar sweetened drinks. She does not exercise.    2. HTN: taking losartan 50 mg daily. No HA, CP, SOB or leg swelling. She has noticed increased flushing.    Social History  Substance Use Topics  . Smoking status: Never Smoker  . Smokeless tobacco: Not on file  . Alcohol use Yes     Comment: rarely    Outpatient Medications Prior to Visit  Medication Sig Dispense Refill  . ferrous sulfate 325 (65 FE) MG tablet Take 325 mg by mouth 2 (two) times daily with a meal.    . losartan (COZAAR) 100 MG tablet Take 1 tablet (100 mg total) by mouth daily. 30 tablet 5  . losartan (COZAAR) 50 MG tablet TAKE 1 TABLET BY MOUTH DAILY 30 tablet 2  . meloxicam (MOBIC) 15 MG tablet Take 1 tablet (15 mg total) by mouth daily. 30 tablet 2  . Multiple Vitamins-Minerals (HAIR SKIN AND NAILS FORMULA PO) Take 2 each by mouth every morning. Reported on 03/18/2016    . traMADol (ULTRAM) 50 MG tablet Take 1 tablet (50 mg total) by mouth every 8 (eight) hours as needed. 60 tablet 0  . esomeprazole (NEXIUM) 40 MG capsule Take 1 capsule (40 mg total) by mouth daily. (Patient not taking: Reported on 11/11/2016) 30 capsule 3   No facility-administered medications prior to visit.     ROS Review of Systems  Constitutional: Negative for chills and fever.  Eyes: Positive for visual disturbance.  Respiratory: Negative for shortness of breath.   Cardiovascular: Positive for chest pain (sharp burning pain in L chest lateral to sternum ).  Gastrointestinal: Negative for abdominal pain and blood in stool.  Genitourinary: Negative for flank pain.  Musculoskeletal: Positive for arthralgias.  Skin: Negative for rash.  Allergic/Immunologic: Negative for  immunocompromised state.  Hematological: Negative for adenopathy. Does not bruise/bleed easily.  Psychiatric/Behavioral: Negative for dysphoric mood and suicidal ideas.    Objective:  BP (!) 165/95 (BP Location: Right Arm, Patient Position: Sitting, Cuff Size: Small)   Pulse 78   Temp 97.9 F (36.6 C) (Oral)   Ht 5\' 3"  (1.6 m)   Wt 241 lb (109.3 kg)   SpO2 99%   BMI 42.69 kg/m   BP/Weight 11/11/2016 06/21/2016 04/23/2016  Systolic BP 165 129 135  Diastolic BP 95 85 91  Wt. (Lbs) 241 227 -  BMI 42.69 40.21 -   Physical Exam  Constitutional: She is oriented to person, place, and time. She appears well-developed and well-nourished. No distress.  HENT:  Head: Normocephalic and atraumatic.  Cardiovascular: Normal rate, regular rhythm, normal heart sounds and intact distal pulses.   Pulmonary/Chest: Effort normal and breath sounds normal.  Musculoskeletal: She exhibits edema (trace in LE ).       Left shoulder: Normal.     Neurological: She is alert and oriented to person, place, and time.  Skin: Skin is warm and dry. No rash noted.  Psychiatric: She has a normal mood and affect.   Lab Results  Component Value Date   HGBA1C 5.2 06/21/2016    Assessment & Plan:   There are no diagnoses linked to this encounter. Danesha was seen today for hypertension.  Diagnoses and all  orders for this visit:  Essential hypertension -     Discontinue: losartan (COZAAR) 100 MG tablet; Take 1 tablet (100 mg total) by mouth daily. -     Discontinue: losartan-hydrochlorothiazide (HYZAAR) 50-12.5 MG tablet; Take 1 tablet by mouth daily. -     Discontinue: losartan-hydrochlorothiazide (HYZAAR) 50-12.5 MG tablet; Take 1 tablet by mouth daily. -     losartan-hydrochlorothiazide (HYZAAR) 50-12.5 MG tablet; Take 1 tablet by mouth daily.  Chronic bilateral low back pain without sciatica -     meloxicam (MOBIC) 15 MG tablet; Take 1 tablet (15 mg total) by mouth daily.   No orders of the defined  types were placed in this encounter.   Follow-up: Return in about 6 weeks (around 12/23/2016) for HTN .   Dessa PhiJosalyn Chas Axel MD

## 2016-11-11 NOTE — Assessment & Plan Note (Signed)
A: HTN Med: compliant with losartan 50 mg daily P: Change to hyzaar 50-12.5 mg daily

## 2016-12-26 ENCOUNTER — Ambulatory Visit: Payer: BLUE CROSS/BLUE SHIELD | Attending: Family Medicine | Admitting: Family Medicine

## 2016-12-26 ENCOUNTER — Encounter: Payer: Self-pay | Admitting: Family Medicine

## 2016-12-26 VITALS — BP 144/91 | HR 84 | Temp 98.4°F | Ht 63.0 in | Wt 238.8 lb

## 2016-12-26 DIAGNOSIS — K219 Gastro-esophageal reflux disease without esophagitis: Secondary | ICD-10-CM

## 2016-12-26 DIAGNOSIS — Z79899 Other long term (current) drug therapy: Secondary | ICD-10-CM | POA: Diagnosis not present

## 2016-12-26 DIAGNOSIS — E669 Obesity, unspecified: Secondary | ICD-10-CM | POA: Diagnosis not present

## 2016-12-26 DIAGNOSIS — Z6841 Body Mass Index (BMI) 40.0 and over, adult: Secondary | ICD-10-CM | POA: Diagnosis not present

## 2016-12-26 DIAGNOSIS — I10 Essential (primary) hypertension: Secondary | ICD-10-CM | POA: Diagnosis not present

## 2016-12-26 MED ORDER — LOSARTAN POTASSIUM-HCTZ 50-12.5 MG PO TABS: 2.0000 | ORAL_TABLET | Freq: Every day | ORAL | 5 refills | Status: DC

## 2016-12-26 MED ORDER — PANTOPRAZOLE SODIUM 20 MG PO TBEC: 20.0000 mg | DELAYED_RELEASE_TABLET | Freq: Every day | ORAL | 2 refills | Status: DC

## 2016-12-26 NOTE — Progress Notes (Signed)
Subjective:  Patient ID: Angela Castillo, female    DOB: 04/28/1976  Age: 41 y.o. MRN: [id number]  CC: Hypertension   HPI Angela Castillo presents for   1. Obesity: working to lose weight. Feeling better. Has decreased sugar in her diet.   2. HTN: improved with hyzaar. No CP, SOB or leg swelling.   Social History  Substance Use Topics  . Smoking status: Never Smoker  . Smokeless tobacco: Never Used  . Alcohol use Yes     Comment: rarely    Outpatient Medications Prior to Visit  Medication Sig Dispense Refill  . ferrous sulfate 325 (65 FE) MG tablet Take 325 mg by mouth 2 (two) times daily with a meal.    . losartan-hydrochlorothiazide (HYZAAR) 50-12.5 MG tablet Take 1 tablet by mouth daily. 30 tablet 5  . meloxicam (MOBIC) 15 MG tablet Take 1 tablet (15 mg total) by mouth daily. 30 tablet 5  . Multiple Vitamins-Minerals (HAIR SKIN AND NAILS FORMULA PO) Take 2 each by mouth every morning. Reported on 03/18/2016    . traMADol (ULTRAM) 50 MG tablet Take 1 tablet (50 mg total) by mouth every 8 (eight) hours as needed. 60 tablet 0  . esomeprazole (NEXIUM) 40 MG capsule Take 1 capsule (40 mg total) by mouth daily. (Patient not taking: Reported on 11/11/2016) 30 capsule 3   No facility-administered medications prior to visit.     ROS Review of Systems  Constitutional: Negative for chills and fever.  Eyes: Negative for visual disturbance.  Respiratory: Negative for shortness of breath.   Cardiovascular: Negative for chest pain ( ).  Gastrointestinal: Negative for abdominal pain and blood in stool.  Genitourinary: Negative for flank pain.  Musculoskeletal: Positive for arthralgias.  Skin: Negative for rash.  Allergic/Immunologic: Negative for immunocompromised state.  Hematological: Negative for adenopathy. Does not bruise/bleed easily.  Psychiatric/Behavioral: Negative for dysphoric mood and suicidal ideas.    Objective:  BP (!) 144/91 (BP Location: Left Arm,  Patient Position: Sitting, Cuff Size: Small)   Pulse 84   Temp 98.4 F (36.9 C) (Oral)   Ht 5\' 3"  (1.6 m)   Wt 238 lb 12.8 oz (108.3 kg)   LMP 12/09/2016   SpO2 98%   BMI 42.30 kg/m   BP/Weight 12/26/2016 11/11/2016 06/21/2016  Systolic BP 144 165 129  Diastolic BP 91 95 85  Wt. (Lbs) 238.8 241 227  BMI 42.3 42.69 40.21   Physical Exam  Constitutional: She is oriented to person, place, and time. She appears well-developed and well-nourished. No distress.  Obese   HENT:  Head: Normocephalic and atraumatic.  Cardiovascular: Normal rate, regular rhythm, normal heart sounds and intact distal pulses.   Pulmonary/Chest: Effort normal and breath sounds normal.  Musculoskeletal: She exhibits no edema.       Left shoulder: Normal.     Neurological: She is alert and oriented to person, place, and time.  Skin: Skin is warm and dry. No rash noted.  Psychiatric: She has a normal mood and affect.   Lab Results  Component Value Date   HGBA1C 5.2 06/21/2016    Assessment & Plan:   There are no diagnoses linked to this encounter. Angela Castillo was seen today for hypertension.  Diagnoses and all orders for this visit:  Gastroesophageal reflux disease without esophagitis -     pantoprazole (PROTONIX) 20 MG tablet; Take 1 tablet (20 mg total) by mouth daily.  Essential hypertension -     losartan-hydrochlorothiazide (HYZAAR) 50-12.5 MG tablet;  Take 2 tablets by mouth daily.   No orders of the defined types were placed in this encounter.   Follow-up: Return in about 2 weeks (around 01/09/2017).   Dessa Phi MD

## 2016-12-26 NOTE — Patient Instructions (Addendum)
Angela Castillo was seen today for hypertension.  Diagnoses and all orders for this visit:  Gastroesophageal reflux disease without esophagitis -     pantoprazole (PROTONIX) 20 MG tablet; Take 1 tablet (20 mg total) by mouth daily.  Essential hypertension -     losartan-hydrochlorothiazide (HYZAAR) 50-12.5 MG tablet; Take 2 tablets by mouth daily.  vit B complex for energy  Try sugar free green tea or green tea extract to help with metabolism and energy   F/u in 2 weeks for BP check with clinical pharmacologist  F/u with me in 6 weeks for HTN, GERD  and weight check   Dr. Armen Pickup

## 2016-12-29 NOTE — Assessment & Plan Note (Signed)
Improved Med: compliant Increase hyzaar to two tabs twice daily

## 2017-01-09 ENCOUNTER — Ambulatory Visit: Payer: BLUE CROSS/BLUE SHIELD | Attending: Family Medicine | Admitting: Pharmacist

## 2017-01-09 VITALS — BP 131/84 | HR 86

## 2017-01-09 DIAGNOSIS — I10 Essential (primary) hypertension: Secondary | ICD-10-CM | POA: Diagnosis not present

## 2017-01-09 NOTE — Patient Instructions (Signed)
Thanks for coming to see [place]!  Your blood pressure looks great.  Follow up with Dr. Adrian Blackwater as directed

## 2017-01-09 NOTE — Progress Notes (Signed)
   S:    Patient arrives in good spirits after work. Presents to the clinic for hypertension evaluation. Patient was referred on 12/26/16 by Dr. Adrian Blackwater.  Patient was last seen by Primary Care Provider on 12/26/16.   Patient reports adherence with medications.  Current BP Medications include:  Losartan-HCTZ 50-12.5 mg 2 tablets daily.    O:   Last 3 Office BP readings: BP Readings from Last 3 Encounters:  01/09/17 131/84  12/26/16 (!) 144/91  11/11/16 (!) 165/95    BMET    Component Value Date/Time   NA 139 06/21/2016 1713   K 4.0 06/21/2016 1713   CL 106 06/21/2016 1713   CO2 28 06/21/2016 1713   GLUCOSE 75 06/21/2016 1713   BUN 15 06/21/2016 1713   CREATININE 0.68 06/21/2016 1713   CALCIUM 9.5 06/21/2016 1713   GFRNONAA >89 06/21/2016 1713   GFRAA >89 06/21/2016 1713    A/P: Hypertension longstanding currently controlled on current medications.  Continued all medications as prescribed.   Results reviewed and written information provided.   Total time in face-to-face counseling 10 minutes.   F/U Clinic Visit with Dr. Adrian Blackwater as directed.  Patient seen with Maryan Char, PharmD Candidate

## 2017-02-06 ENCOUNTER — Encounter: Payer: Self-pay | Admitting: Family Medicine

## 2017-02-06 ENCOUNTER — Ambulatory Visit: Payer: BLUE CROSS/BLUE SHIELD | Attending: Family Medicine | Admitting: Family Medicine

## 2017-02-06 VITALS — BP 118/76 | HR 72 | Temp 98.0°F | Ht 63.0 in | Wt 241.4 lb

## 2017-02-06 DIAGNOSIS — R109 Unspecified abdominal pain: Secondary | ICD-10-CM | POA: Diagnosis not present

## 2017-02-06 DIAGNOSIS — Z79899 Other long term (current) drug therapy: Secondary | ICD-10-CM | POA: Insufficient documentation

## 2017-02-06 DIAGNOSIS — I10 Essential (primary) hypertension: Secondary | ICD-10-CM | POA: Insufficient documentation

## 2017-02-06 DIAGNOSIS — G8929 Other chronic pain: Secondary | ICD-10-CM | POA: Insufficient documentation

## 2017-02-06 DIAGNOSIS — M25512 Pain in left shoulder: Secondary | ICD-10-CM | POA: Insufficient documentation

## 2017-02-06 LAB — POCT URINALYSIS DIPSTICK
Blood, UA: NEGATIVE
Ketones, UA: NEGATIVE
Leukocytes, UA: NEGATIVE
Nitrite, UA: NEGATIVE
Spec Grav, UA: 1.03 (ref 1.030–1.035)
Urobilinogen, UA: 1 (ref ?–2.0)
pH, UA: 5.5 (ref 5.0–8.0)

## 2017-02-06 MED ORDER — HYDROCHLOROTHIAZIDE 12.5 MG PO CAPS: 12.5000 mg | ORAL_CAPSULE | Freq: Every day | ORAL | 5 refills | Status: DC

## 2017-02-06 MED ORDER — LOSARTAN POTASSIUM 100 MG PO TABS: 100.0000 mg | ORAL_TABLET | Freq: Every day | ORAL | 5 refills | Status: DC

## 2017-02-06 NOTE — Assessment & Plan Note (Signed)
A; well controlled Med: compliant, intolerant of urinary frequency with 25 mg HCTZ Plan: Change to losartan 100 mg daily HCTZ 12.5 mg daily

## 2017-02-06 NOTE — Patient Instructions (Addendum)
Angela Castillo was seen today for hypertension and weight check.  Diagnoses and all orders for this visit:  Essential hypertension -     losartan (COZAAR) 100 MG tablet; Take 1 tablet (100 mg total) by mouth daily. -     hydrochlorothiazide (MICROZIDE) 12.5 MG capsule; Take 1 capsule (12.5 mg total) by mouth daily.  Bilateral flank pain -     POCT urinalysis dipstick  Chronic left shoulder pain   No evidence of renal stone or infection causing flank pain Continue mobic as needed Start exercise for shoulder and low back  If pains worsen please call for oral prednisone  f/u in 6 weeks for HTN  Dr. Armen Pickup  Shoulder Exercises Ask your health care provider which exercises are safe for you. Do exercises exactly as told by your health care provider and adjust them as directed. It is normal to feel mild stretching, pulling, tightness, or discomfort as you do these exercises, but you should stop right away if you feel sudden pain or your pain gets worse.Do not begin these exercises until told by your health care provider. RANGE OF MOTION EXERCISES  These exercises warm up your muscles and joints and improve the movement and flexibility of your shoulder. These exercises also help to relieve pain, numbness, and tingling. These exercises involve stretching your injured shoulder directly. Exercise A: Pendulum   1. Stand near a wall or a surface that you can hold onto for balance. 2. Bend at the waist and let your left / right arm hang straight down. Use your other arm to support you. Keep your back straight and do not lock your knees. 3. Relax your left / right arm and shoulder muscles, and move your hips and your trunk so your left / right arm swings freely. Your arm should swing because of the motion of your body, not because you are using your arm or shoulder muscles. 4. Keep moving your body so your arm swings in the following directions, as told by your health care provider:  Side to  side.  Forward and backward.  In clockwise and counterclockwise circles. 5. Continue each motion for __________ seconds, or for as long as told by your health care provider. 6. Slowly return to the starting position. Repeat __________ times. Complete this exercise __________ times a day. Exercise B:Flexion, Standing   1. Stand and hold a broomstick, a cane, or a similar object. Place your hands a little more than shoulder-width apart on the object. Your left / right hand should be palm-up, and your other hand should be palm-down. 2. Keep your elbow straight and keep your shoulder muscles relaxed. Push the stick down with your healthy arm to raise your left / right arm in front of your body, and then over your head until you feel a stretch in your shoulder.  Avoid shrugging your shoulder while you raise your arm. Keep your shoulder blade tucked down toward the middle of your back. 3. Hold for __________ seconds. 4. Slowly return to the starting position. Repeat __________ times. Complete this exercise __________ times a day. Exercise C: Abduction, Standing  1. Stand and hold a broomstick, a cane, or a similar object. Place your hands a little more than shoulder-width apart on the object. Your left / right hand should be palm-up, and your other hand should be palm-down. 2. While keeping your elbow straight and your shoulder muscles relaxed, push the stick across your body toward your left / right side. Raise your left /  right arm to the side of your body and then over your head until you feel a stretch in your shoulder.  Do not raise your arm above shoulder height, unless your health care provider tells you to do that.  Avoid shrugging your shoulder while you raise your arm. Keep your shoulder blade tucked down toward the middle of your back. 3. Hold for __________ seconds. 4. Slowly return to the starting position. Repeat __________ times. Complete this exercise __________ times a  day. Exercise D:Internal Rotation   1. Place your left / right hand behind your back, palm-up. 2. Use your other hand to dangle an exercise band, a towel, or a similar object over your shoulder. Grasp the band with your left / right hand so you are holding onto both ends. 3. Gently pull up on the band until you feel a stretch in the front of your left / right shoulder.  Avoid shrugging your shoulder while you raise your arm. Keep your shoulder blade tucked down toward the middle of your back. 4. Hold for __________ seconds. 5. Release the stretch by letting go of the band and lowering your hands. Repeat __________ times. Complete this exercise __________ times a day. STRETCHING EXERCISES  These exercises warm up your muscles and joints and improve the movement and flexibility of your shoulder. These exercises also help to relieve pain, numbness, and tingling. These exercises are done using your healthy shoulder to help stretch the muscles of your injured shoulder. Exercise E: Research officer, political party (External Rotation and Abduction)   1. Stand in a doorway with one of your feet slightly in front of the other. This is called a staggered stance. If you cannot reach your forearms to the door frame, stand facing a corner of a room. 2. Choose one of the following positions as told by your health care provider:  Place your hands and forearms on the door frame above your head.  Place your hands and forearms on the door frame at the height of your head.  Place your hands on the door frame at the height of your elbows. 3. Slowly move your weight onto your front foot until you feel a stretch across your chest and in the front of your shoulders. Keep your head and chest upright and keep your abdominal muscles tight. 4. Hold for __________ seconds. 5. To release the stretch, shift your weight to your back foot. Repeat __________ times. Complete this stretch __________ times a day. Exercise F:Extension,  Standing  1. Stand and hold a broomstick, a cane, or a similar object behind your back.  Your hands should be a little wider than shoulder-width apart.  Your palms should face away from your back. 2. Keeping your elbows straight and keeping your shoulder muscles relaxed, move the stick away from your body until you feel a stretch in your shoulder.  Avoid shrugging your shoulders while you move the stick. Keep your shoulder blade tucked down toward the middle of your back. 3. Hold for __________ seconds. 4. Slowly return to the starting position. Repeat __________ times. Complete this exercise __________ times a day. STRENGTHENING EXERCISES  These exercises build strength and endurance in your shoulder. Endurance is the ability to use your muscles for a long time, even after they get tired. Exercise G:External Rotation   1. Sit in a stable chair without armrests. 2. Secure an exercise band at elbow height on your left / right side. 3. Place a soft object, such as a folded towel  or a small pillow, between your left / right upper arm and your body to move your elbow a few inches away (about 10 cm) from your side. 4. Hold the end of the band so it is tight and there is no slack. 5. Keeping your elbow pressed against the soft object, move your left / right forearm out, away from your abdomen. Keep your body steady so only your forearm moves. 6. Hold for __________ seconds. 7. Slowly return to the starting position. Repeat __________ times. Complete this exercise __________ times a day. Exercise H:Shoulder Abduction   1. Sit in a stable chair without armrests, or stand. 2. Hold a __________ weight in your left / right hand, or hold an exercise band with both hands. 3. Start with your arms straight down and your left / right palm facing in, toward your body. 4. Slowly lift your left / right hand out to your side. Do not lift your hand above shoulder height unless your health care provider  tells you that this is safe.  Keep your arms straight.  Avoid shrugging your shoulder while you do this movement. Keep your shoulder blade tucked down toward the middle of your back. 5. Hold for __________ seconds. 6. Slowly lower your arm, and return to the starting position. Repeat __________ times. Complete this exercise __________ times a day. Exercise I:Shoulder Extension  1. Sit in a stable chair without armrests, or stand. 2. Secure an exercise band to a stable object in front of you where it is at shoulder height. 3. Hold one end of the exercise band in each hand. Your palms should face each other. 4. Straighten your elbows and lift your hands up to shoulder height. 5. Step back, away from the secured end of the exercise band, until the band is tight and there is no slack. 6. Squeeze your shoulder blades together as you pull your hands down to the sides of your thighs. Stop when your hands are straight down by your sides. Do not let your hands go behind your body. 7. Hold for __________ seconds. 8. Slowly return to the starting position. Repeat __________ times. Complete this exercise __________ times a day. Exercise J:Standing Shoulder Row  1. Sit in a stable chair without armrests, or stand. 2. Secure an exercise band to a stable object in front of you so it is at waist height. 3. Hold one end of the exercise band in each hand. Your palms should be in a thumbs-up position. 4. Bend each of your elbows to an "L" shape (about 90 degrees) and keep your upper arms at your sides. 5. Step back until the band is tight and there is no slack. 6. Slowly pull your elbows back behind you. 7. Hold for __________ seconds. 8. Slowly return to the starting position. Repeat __________ times. Complete this exercise __________ times a day. Exercise K:Shoulder Press-Ups   1. Sit in a stable chair that has armrests. Sit upright, with your feet flat on the floor. 2. Put your hands on the  armrests so your elbows are bent and your fingers are pointing forward. Your hands should be about even with the sides of your body. 3. Push down on the armrests and use your arms to lift yourself off of the chair. Straighten your elbows and lift yourself up as much as you comfortably can.  Move your shoulder blades down, and avoid letting your shoulders move up toward your ears.  Keep your feet on the ground. As  you get stronger, your feet should support less of your body weight as you lift yourself up. 4. Hold for __________ seconds. 5. Slowly lower yourself back into the chair. Repeat __________ times. Complete this exercise __________ times a day. Exercise L: Wall Push-Ups   1. Stand so you are facing a stable wall. Your feet should be about one arm-length away from the wall. 2. Lean forward and place your palms on the wall at shoulder height. 3. Keep your feet flat on the floor as you bend your elbows and lean forward toward the wall. 4. Hold for __________ seconds. 5. Straighten your elbows to push yourself back to the starting position. Repeat __________ times. Complete this exercise __________ times a day. This information is not intended to replace advice given to you by your health care provider. Make sure you discuss any questions you have with your health care provider. Document Released: 09/04/2005 Document Revised: 07/15/2016 Document Reviewed: 07/02/2015 Elsevier Interactive Patient Education  2017 [employer].

## 2017-02-06 NOTE — Progress Notes (Signed)
Subjective:  Patient ID: Angela Castillo, female    DOB: May 29, 1976  Age: 41 y.o. MRN: [id number]  CC: Hypertension and Weight Check   HPI Angela Castillo presents for   1. HTN : improved with hyzaar. No CP, SOB or leg swelling.  She complains of urinary frequency that interfering with work.   2. L shoulder pain: still with pain in her L anterior and lateral shoulder that radiates down her arm. She also has L hand numbness at times.   3. Flank pain: off and on. Worse on R side for past 2 weeks. She drinks sweet tea, water and milk. She denies blood in urine. She has urinary frequency and takes HCTZ 25 mg daily. No dysuria. No fever or chills. Some nausea. No emesis.   Social History  Substance Use Topics  . Smoking status: Never Smoker  . Smokeless tobacco: Never Used  . Alcohol use Yes     Comment: rarely    Outpatient Medications Prior to Visit  Medication Sig Dispense Refill  . ferrous sulfate 325 (65 FE) MG tablet Take 325 mg by mouth 2 (two) times daily with a meal.    . losartan-hydrochlorothiazide (HYZAAR) 50-12.5 MG tablet Take 2 tablets by mouth daily. 60 tablet 5  . meloxicam (MOBIC) 15 MG tablet Take 1 tablet (15 mg total) by mouth daily. 30 tablet 5  . pantoprazole (PROTONIX) 20 MG tablet Take 1 tablet (20 mg total) by mouth daily. 30 tablet 2  . Multiple Vitamins-Minerals (HAIR SKIN AND NAILS FORMULA PO) Take 2 each by mouth every morning. Reported on 03/18/2016     No facility-administered medications prior to visit.     ROS Review of Systems  Constitutional: Negative for chills and fever.  Eyes: Negative for visual disturbance.  Respiratory: Negative for shortness of breath.   Cardiovascular: Negative for chest pain ( ).  Gastrointestinal: Negative for abdominal pain and blood in stool.  Genitourinary: Negative for flank pain.  Musculoskeletal: Positive for arthralgias.  Skin: Negative for rash.  Allergic/Immunologic: Negative for  immunocompromised state.  Hematological: Negative for adenopathy. Does not bruise/bleed easily.  Psychiatric/Behavioral: Negative for dysphoric mood and suicidal ideas.    Objective:  BP 118/76   Pulse 72   Temp 98 F (36.7 C) (Oral)   Ht  (1.6 m)   Wt 241 lb 6.4 oz (109.5 kg)   LMP 01/07/2017   BMI 42.76 kg/m   BP/Weight 02/06/2017 01/09/2017 12/26/2016  Systolic BP 118 131 144  Diastolic BP 76 84 91  Wt. (Lbs) 241.4 - 238.8  BMI 42.76 - 42.3   Wt Readings from Last 3 Encounters:  02/06/17 241 lb 6.4 oz (109.5 kg)  12/26/16 238 lb 12.8 oz (108.3 kg)  11/11/16 241 lb (109.3 kg)    Physical Exam  Constitutional: She is oriented to person, place, and time. She appears well-developed and well-nourished. No distress.  Obese   HENT:  Head: Normocephalic and atraumatic.  Cardiovascular: Normal rate, regular rhythm, normal heart sounds and intact distal pulses.   Pulmonary/Chest: Effort normal and breath sounds normal.  Abdominal: Soft. Bowel sounds are normal. She exhibits no distension and no mass. There is no tenderness. There is CVA tenderness. There is no rebound and no guarding.  Musculoskeletal: She exhibits no edema.       Left shoulder: Normal.     Neurological: She is alert and oriented to person, place, and time.  Skin: Skin is warm and dry. No rash noted.  Psychiatric: She has a normal mood and affect.   Lab Results  Component Value Date   HGBA1C 5.2 06/21/2016    Assessment & Plan:  Angela Castillo was seen today for hypertension and weight check.  Diagnoses and all orders for this visit:  Essential hypertension -     losartan (COZAAR) 100 MG tablet; Take 1 tablet (100 mg total) by mouth daily. -     hydrochlorothiazide (MICROZIDE) 12.5 MG capsule; Take 1 capsule (12.5 mg total) by mouth daily.  Bilateral flank pain -     POCT urinalysis dipstick  Chronic left shoulder pain   There are no diagnoses linked to this encounter. There are no diagnoses linked  to this encounter. No orders of the defined types were placed in this encounter.   Follow-up: Return in about 6 weeks (around 03/20/2017) for HTN .   Dessa Phi MD

## 2017-02-26 ENCOUNTER — Encounter: Payer: Self-pay | Admitting: Family Medicine

## 2017-02-28 NOTE — Telephone Encounter (Signed)
Patient concern

## 2017-03-07 NOTE — Telephone Encounter (Signed)
Patient states she has an appointment on the 17 th and has been having increased left shoulder pain.

## 2017-03-19 ENCOUNTER — Encounter: Payer: Self-pay | Admitting: Family Medicine

## 2017-03-19 NOTE — Progress Notes (Signed)
Subjective:  Patient ID: Angela Castillo, female    DOB: 12-Jun-1976  Age: 41 y.o. MRN: [id number]  CC: Hypertension and Gastroesophageal Reflux   HPI Angela Castillo has obesity, HTN, chronic low back pain she  presents for   1. HTN: improved with hyzaar. No CP, SOB or leg swelling.  She complains of urinary frequency that interfering with work. The hyzaar was changed to losartan 100 mg daily and HCTZ 12.5 mg daily. Urinary frequency continued so she stopped HCTZ 12.5 mg daily.   2. L shoulder pain: started  11/2015 after falling out of a chair. Was initially intermittent but is not fairly constant. still with pain in her L anterior and lateral shoulder that radiates down her arm.  Pain is worsening. Pain acutely worsened on 03/03/17. Pain is anterior and lateral shoulder. No lateral neck pain.  She also has L arm tingling or numbness when she is lying on her L side. She took two doses of tramadol since the pain flared up and that helped. Her current level of pain is 7/10.   Imaging 11/06/2015: L shoulder x-ray FINDINGS: No fracture or dislocation. The alignment and joint spaces are maintained. No soft tissue abnormality.  IMPRESSION: No fracture or dislocation of the left shoulder.   3. R great toe discoloration: first noticed 3 weeks ago. Painless. No injury to toe.   4. R lateral foot pain: for past 1.5 years. No recent injury. Remote injury to 4th toe about 5 year ago. Mild swelling in both feet. No redness.   Social History  Substance Use Topics  . Smoking status: Never Smoker  . Smokeless tobacco: Never Used  . Alcohol use Yes     Comment: rarely    Outpatient Medications Prior to Visit  Medication Sig Dispense Refill  . ferrous sulfate 325 (65 FE) MG tablet Take 325 mg by mouth 2 (two) times daily with a meal.    . hydrochlorothiazide (MICROZIDE) 12.5 MG capsule Take 1 capsule (12.5 mg total) by mouth daily. 30 capsule 5  . losartan (COZAAR) 100 MG tablet Take 1  tablet (100 mg total) by mouth daily. 30 tablet 5  . meloxicam (MOBIC) 15 MG tablet Take 1 tablet (15 mg total) by mouth daily. 30 tablet 5  . Multiple Vitamins-Minerals (HAIR SKIN AND NAILS FORMULA PO) Take 2 each by mouth every morning. Reported on 03/18/2016    . pantoprazole (PROTONIX) 20 MG tablet Take 1 tablet (20 mg total) by mouth daily. 30 tablet 2   No facility-administered medications prior to visit.     ROS Review of Systems  Constitutional: Negative for chills and fever.  Eyes: Negative for visual disturbance.  Respiratory: Negative for shortness of breath.   Cardiovascular: Negative for chest pain ( ).  Gastrointestinal: Negative for abdominal pain and blood in stool.  Genitourinary: Negative for flank pain.  Musculoskeletal: Positive for arthralgias.  Skin: Negative for rash.  Allergic/Immunologic: Negative for immunocompromised state.  Hematological: Negative for adenopathy. Does not bruise/bleed easily.  Psychiatric/Behavioral: Negative for dysphoric mood and suicidal ideas.    Objective:  BP 130/80   Pulse 90   Temp 98.1 F (36.7 C) (Oral)   Wt 240 lb 3.2 oz (109 kg)   SpO2 98%   BMI 42.55 kg/m   BP/Weight 02/06/2017 01/09/2017 12/26/2016  Systolic BP 118 131 144  Diastolic BP 76 84 91  Wt. (Lbs) 241.4 - 238.8  BMI 42.76 - 42.3   Wt Readings from Last 3 Encounters:  02/06/17 241 lb 6.4 oz (109.5 kg)  12/26/16 238 lb 12.8 oz (108.3 kg)  11/11/16 241 lb (109.3 kg)    Physical Exam  Constitutional: She is oriented to person, place, and time. She appears well-developed and well-nourished. No distress.  Obese   HENT:  Head: Normocephalic and atraumatic.  Cardiovascular: Normal rate, regular rhythm, normal heart sounds and intact distal pulses.   Pulmonary/Chest: Effort normal and breath sounds normal.  Abdominal: Soft. Bowel sounds are normal. She exhibits no distension and no mass. There is no tenderness. There is no rebound, no guarding and no CVA  tenderness.  Musculoskeletal: She exhibits no edema.       Left shoulder: She exhibits decreased range of motion, tenderness (anterior and lateral ) and pain. She exhibits no spasm, normal pulse and normal strength.       Feet:     Neurological: She is alert and oriented to person, place, and time.  Skin: Skin is warm and dry. No rash noted.  Psychiatric: She has a normal mood and affect.   Lab Results  Component Value Date   HGBA1C 5.2 06/21/2016    Assessment & Plan:  Angela Castillo was seen today for hypertension and gastroesophageal reflux.  Diagnoses and all orders for this visit:  Ingrown right big toenail -     Ambulatory referral to Podiatry  Toenail deformity -     Ambulatory referral to Podiatry  Chronic bilateral low back pain without sciatica -     Discontinue: meloxicam (MOBIC) 15 MG tablet; Take 1 tablet (15 mg total) by mouth daily. -     meloxicam (MOBIC) 15 MG tablet; Take 1 tablet (15 mg total) by mouth daily.  Foot pain, right -     Ambulatory referral to Podiatry -     Discontinue: meloxicam (MOBIC) 15 MG tablet; Take 1 tablet (15 mg total) by mouth daily. -     meloxicam (MOBIC) 15 MG tablet; Take 1 tablet (15 mg total) by mouth daily.  Chronic left shoulder pain -     traMADol (ULTRAM) 50 MG tablet; Take 1 tablet (50 mg total) by mouth every 8 (eight) hours as needed. -     Ambulatory referral to Orthopedic Surgery -     meloxicam (MOBIC) 15 MG tablet; Take 1 tablet (15 mg total) by mouth daily.   There are no diagnoses linked to this encounter. There are no diagnoses linked to this encounter. No orders of the defined types were placed in this encounter.   Follow-up: Return in about 5 weeks (around 04/24/2017) for L shoulder pain and R foot problems.   Dessa PhiJosalyn Crixus Mcaulay MD

## 2017-03-20 ENCOUNTER — Ambulatory Visit: Payer: BLUE CROSS/BLUE SHIELD | Attending: Family Medicine | Admitting: Family Medicine

## 2017-03-20 ENCOUNTER — Encounter: Payer: Self-pay | Admitting: Family Medicine

## 2017-03-20 VITALS — BP 130/80 | HR 90 | Temp 98.1°F | Wt 240.2 lb

## 2017-03-20 DIAGNOSIS — M79671 Pain in right foot: Secondary | ICD-10-CM

## 2017-03-20 DIAGNOSIS — Z6841 Body Mass Index (BMI) 40.0 and over, adult: Secondary | ICD-10-CM | POA: Insufficient documentation

## 2017-03-20 DIAGNOSIS — E669 Obesity, unspecified: Secondary | ICD-10-CM | POA: Insufficient documentation

## 2017-03-20 DIAGNOSIS — M545 Low back pain, unspecified: Secondary | ICD-10-CM

## 2017-03-20 DIAGNOSIS — M25512 Pain in left shoulder: Secondary | ICD-10-CM

## 2017-03-20 DIAGNOSIS — L608 Other nail disorders: Secondary | ICD-10-CM | POA: Diagnosis not present

## 2017-03-20 DIAGNOSIS — I10 Essential (primary) hypertension: Secondary | ICD-10-CM | POA: Insufficient documentation

## 2017-03-20 DIAGNOSIS — L600 Ingrowing nail: Secondary | ICD-10-CM | POA: Diagnosis not present

## 2017-03-20 DIAGNOSIS — G8929 Other chronic pain: Secondary | ICD-10-CM | POA: Diagnosis not present

## 2017-03-20 DIAGNOSIS — Z79899 Other long term (current) drug therapy: Secondary | ICD-10-CM | POA: Diagnosis not present

## 2017-03-20 MED ORDER — TRAMADOL HCL 50 MG PO TABS: 50.0000 mg | ORAL_TABLET | Freq: Three times a day (TID) | ORAL | 2 refills | Status: DC | PRN

## 2017-03-20 MED ORDER — MELOXICAM 15 MG PO TABS: 15.0000 mg | ORAL_TABLET | Freq: Every day | ORAL | 5 refills | Status: DC

## 2017-03-20 NOTE — Assessment & Plan Note (Signed)
A: left lateral and anterior shoulder pain for past 17 months worsening. She does repetiite work at [employer] and heavy lifting. Suspect bursitis.  P; Ortho referral for evaluation and possible steroid injection For now continue tramadol prn and mobic

## 2017-03-20 NOTE — Assessment & Plan Note (Signed)
Well controlled.  Continue losartan 100 mg daily.

## 2017-03-20 NOTE — Assessment & Plan Note (Signed)
Lateral foot pain no erythema or localized swelling. No trauma.  Suspect arthritis  Plan: mobic

## 2017-03-20 NOTE — Patient Instructions (Addendum)
Angela Castillo Angela Castillo was seen today for hypertension and gastroesophageal reflux.  Diagnoses and all orders for this visit:  Ingrown right big toenail -     Ambulatory referral to Podiatry  Toenail deformity -     Ambulatory referral to Podiatry  Chronic bilateral low back pain without sciatica -     meloxicam (MOBIC) 15 MG tablet; Take 1 tablet (15 mg total) by mouth daily.  Lateral pain of left hip -     traMADol (ULTRAM) 50 MG tablet; Take 1 tablet (50 mg total) by mouth every 8 (eight) hours as needed. -     Ambulatory referral to Orthopedic Surgery  Foot pain, right -     Ambulatory referral to Podiatry -     meloxicam (MOBIC) 15 MG tablet; Take 1 tablet (15 mg total) by mouth daily.   F/u in 5 weeks   Dr. Armen PickupFunches     Bursitis Bursitis is when the fluid-filled sac (bursa) that covers and protects a joint is swollen (inflamed). Bursitis is most common near joints, especially the knees, elbows, hips, and shoulders. Follow these instructions at home:  Take medicines only as told by your doctor.  If you were prescribed an antibiotic medicine, finish it all even if you start to feel better.  Rest the affected area as told by your doctor.  Keep the area raised up.  Avoid doing things that make the pain worse.  Apply ice to the injured area:  Place ice in a plastic bag.  Place a towel between your skin and the bag.  Leave the ice on for 20 minutes, 2-3 times a day.  Use splints, braces, pads, or walking aids as told by your doctor.  Keep all follow-up visits as told by your doctor. This is important. Contact a doctor if:  You have more pain with home care.  You have a fever.  You have chills. This information is not intended to replace advice given to you by your health care provider. Make sure you discuss any questions you have with your health care provider. Document Released: 04/10/2010 Document Revised: 03/28/2016 Document Reviewed: 01/10/2014 Elsevier Interactive  Patient Education  2017 [employer]

## 2017-04-02 ENCOUNTER — Encounter: Payer: Self-pay | Admitting: Family Medicine

## 2017-04-03 NOTE — Telephone Encounter (Signed)
Patient referral concern

## 2017-04-10 ENCOUNTER — Ambulatory Visit (INDEPENDENT_AMBULATORY_CARE_PROVIDER_SITE_OTHER): Payer: BLUE CROSS/BLUE SHIELD | Admitting: Orthopedic Surgery

## 2017-04-17 ENCOUNTER — Ambulatory Visit (INDEPENDENT_AMBULATORY_CARE_PROVIDER_SITE_OTHER): Payer: BLUE CROSS/BLUE SHIELD | Admitting: Orthopedic Surgery

## 2017-04-21 ENCOUNTER — Ambulatory Visit (INDEPENDENT_AMBULATORY_CARE_PROVIDER_SITE_OTHER): Payer: BLUE CROSS/BLUE SHIELD | Admitting: Orthopaedic Surgery

## 2017-04-21 ENCOUNTER — Ambulatory Visit (INDEPENDENT_AMBULATORY_CARE_PROVIDER_SITE_OTHER): Payer: BLUE CROSS/BLUE SHIELD

## 2017-04-21 ENCOUNTER — Ambulatory Visit (INDEPENDENT_AMBULATORY_CARE_PROVIDER_SITE_OTHER): Payer: BLUE CROSS/BLUE SHIELD | Admitting: Podiatry

## 2017-04-21 ENCOUNTER — Encounter: Payer: Self-pay | Admitting: Podiatry

## 2017-04-21 DIAGNOSIS — L600 Ingrowing nail: Secondary | ICD-10-CM

## 2017-04-21 DIAGNOSIS — M7542 Impingement syndrome of left shoulder: Secondary | ICD-10-CM | POA: Diagnosis not present

## 2017-04-21 DIAGNOSIS — M25512 Pain in left shoulder: Secondary | ICD-10-CM | POA: Diagnosis not present

## 2017-04-21 DIAGNOSIS — G8929 Other chronic pain: Secondary | ICD-10-CM

## 2017-04-21 DIAGNOSIS — Z79899 Other long term (current) drug therapy: Secondary | ICD-10-CM | POA: Diagnosis not present

## 2017-04-21 MED ORDER — METHYLPREDNISOLONE ACETATE 40 MG/ML IJ SUSP
40.0000 mg | INTRAMUSCULAR | Status: AC | PRN
  Administered 2017-04-21: 40 mg via INTRA_ARTICULAR

## 2017-04-21 MED ORDER — LIDOCAINE HCL 1 % IJ SOLN
3.0000 mL | INTRAMUSCULAR | Status: AC | PRN
  Administered 2017-04-21: 3 mL

## 2017-04-21 NOTE — Progress Notes (Signed)
Patient ID: Angela FothergillMichelle D Buege, female   DOB: 08-08-1976, 41 y.o.   MRN: [id number]

## 2017-04-21 NOTE — Progress Notes (Signed)
   Subjective:    Patient ID: Angela Castillo, female    DOB: 20-Jun-1976, 41 y.o.   MRN: [id number]  HPI  41 year old female presents the office today for concerns of intermittent right big toe which is been ongoing the last several weeks it has been worsening. She denies any swelling or redness from drainage or pus but there is very tender to touch with pressure in shoes. She is tried to him the nail herself. No other treatment. She is also concerned about the discoloration of fungus on her toenails. She is no other concerns today.    Review of Systems  All other systems reviewed and are negative.      Objective:   Physical Exam General: AAO x3, NAD  Dermatological: Incurvation present to the right hallux toenail on both the medial lateral nail borders and entire toenail is dystrophic, discolored and hypertrophic and dystrophic. There is tenderness the entire toenail. As well as edema along the nail borders there is no ascending synovitis. There is no drainage or pus. There is no open lesion identified.  Vascular: Dorsalis Pedis artery and Posterior Tibial artery pedal pulses are 2/4 bilateral with immedate capillary fill time. Pedal hair growth present.  There is no pain with calf compression, swelling, warmth, erythema.   Neruologic: Grossly intact via light touch bilateral. Protective threshold with Semmes Wienstein monofilament intact to all pedal sites bilateral.   Musculoskeletal: No gross boney pedal deformities bilateral. No pain, crepitus, or limitation noted with foot and ankle range of motion bilateral. Muscular strength 5/5 in all groups tested bilateral.  Gait: Unassisted, Nonantalgic.     Assessment & Plan:  41 year old female right hallux ingrown toenail, onychomycosis -Treatment options discussed including all alternatives, risks, and complications -Etiology of symptoms were discussed At this time, recommended total nail removal without chemical matricectomy to  the right hallux toneail given the ingrowing and the entire nail is dystrophic. Risks and complications were discussed with the patient for which they understand and  verbally consent to the procedure. Under sterile conditions a total of 3 mL of a mixture of 2% lidocaine plain and 0.5% Marcaine plain was infiltrated in a hallux block fashion. Once anesthetized, the skin was prepped in sterile fashion. A tourniquet was then applied. Next the right hallux nail was excised making sure to remove the entire offending nail. Once the nail was removed, the area was debrided and the underlying skin was intact. The area was irrigated and hemostasis was obtained.  A dry sterile dressing was applied. After application of the dressing the tourniquet was removed and there is found to be an immediate capillary refill time to the digit. The patient tolerated the procedure well any complications. Post procedure instructions were discussed the patient for which he verbally understood. Follow-up in one week for nail check or sooner if any problems are to arise. Discussed signs/symptoms of worsening infection and directed to call the office immediately should any occur or go directly to the emergency room. In the meantime, encouraged to call the office with any questions, concerns, changes symptoms. -Ordered CBC and LFT in preparation for likely lamisil. I did send the nail for culture. Discussed potential side affects of lamisil.   Celesta Gentile, DPM

## 2017-04-21 NOTE — Patient Instructions (Signed)

## 2017-04-21 NOTE — Progress Notes (Signed)
Office Visit Note   Patient: Angela Castillo           Date of Birth: 03/31/1976           MRN: [id number] Visit Date: 04/21/2017              Requested by: Boykin Nearing, MD [address] [place], [place] [address] PCP: Boykin Nearing, MD   Assessment & Plan: Visit Diagnoses:  1. Chronic left shoulder pain   2. Impingement syndrome of left shoulder     Plan: I do feel this is likely impingement syndrome of her left shoulder. I showed her shoulder model explaining detail what this involves. Offered her a subacromial steroid injection and she agreed with this. I expanded the risks and benefits of steroid injections in this area and she tolerated injection without issues. She'll continue her meloxicam. I talked about see her back in a month but she said she is moving to [place] so she'll follow-up as needed. All questions were encouraged and answered.  Follow-Up Instructions: Return if symptoms worsen or fail to improve.   Orders:  Orders Placed This Encounter  Procedures  . Large Joint Injection/Arthrocentesis  . XR Shoulder Left   No orders of the defined types were placed in this encounter.     Procedures: Large Joint Inj Date/Time: 04/21/2017 10:15 AM Performed by: Mcarthur Rossetti Authorized by: Mcarthur Rossetti   Location:  Shoulder Site:  L subacromial bursa Ultrasound Guidance: No   Fluoroscopic Guidance: No   Arthrogram: No   Medications:  3 mL lidocaine 1 %; 40 mg methylPREDNISolone acetate 40 MG/ML     Clinical Data: No additional findings.   Subjective: No chief complaint on file. The patient is a very pleasant 41 year old I'm seeing for the first time as a patient although her mother was a patient of mine before mother passed away 2 years ago. She's been having pain with repetitive activities reaching overhead involving her left shoulder. She feels like she injury that shoulder about a year ago but she does a lot of work using  repetitive activities the left shoulder all day long. She is right-hand-dominant. She did hurts her most reaching overhead and behind her and it does wake her up at night. She does take meloxicam to help with her symptoms. She denies any neck pain and denies any radicular symptoms going down her left arm.  HPI  Review of Systems She denies any headache, chest pain, shortness of breath, fever, chills, nausea, vomiting.  Objective: Vital Signs: There were no vitals taken for this visit.  Physical Exam She is alert and oriented 3 and in no acute distress Ortho Exam Examination of her left shoulder does show some mildly positive Neer and Hawkins signs are range of motion is entirely full. She has 5 out of 5 strength of the rotator cuff with abduction-rotation. Her liftoff is negative. Her pain seems to be mainly around the subacromial outlet and before meals joint. Specialty Comments:  No specialty comments available.  Imaging: Xr Shoulder Left  Result Date: 04/21/2017 3 views of left shoulder AP outlet and axillary views show well located shoulder with no acute findings.    PMFS History: Patient Active Problem List   Diagnosis Date Noted  . Impingement syndrome of left shoulder 04/21/2017  . Foot pain, right 03/20/2017  . Low back pain 03/18/2016  . Left shoulder pain 03/18/2016  . Esophageal reflux 03/18/2016  . Lateral pain of left hip 10/25/2015  .  Dizziness 06/12/2015  . Heel pain, bilateral 05/03/2015  . Obesity (BMI 30-39.9) 05/03/2015  . Hearing loss in right ear 03/01/2015  . Family history of diabetes mellitus in mother 03/01/2015  . Essential hypertension 01/18/2015  . Anemia, iron deficiency 01/04/2015  . Musculoskeletal chest pain 01/04/2015  . Transaminitis 01/03/2015   Past Medical History:  Diagnosis Date  . Anemia Dx January 03 2015  . Elevated liver enzymes   . Hypertension Dx Apr 2016  . Reflux     Family History  Problem Relation Age of Onset  . COPD  Mother   . Esophageal cancer Mother   . Diabetes Mother   . Cancer Mother   . Hypertension Mother   . Diabetes Father     Past Surgical History:  Procedure Laterality Date  . TUBAL LIGATION  Jul 25, 2000   Social History   Occupational History  . Not on file.   Social History Main Topics  . Smoking status: Never Smoker  . Smokeless tobacco: Never Used  . Alcohol use Yes     Comment: rarely  . Drug use: No  . Sexual activity: Not on file

## 2017-04-24 NOTE — Addendum Note (Signed)
Addended by: Ovid CurdWAGONER, Yui Mulvaney R on: 04/24/2017 06:46 AM   Modules accepted: Level of Service

## 2017-04-30 NOTE — Progress Notes (Signed)
Subjective:  Patient ID: Angela Castillo, female    DOB: 01-26-1976  Age: 41 y.o. MRN: [id number]  CC: Hypertension   HPI Angela Castillo has obesity, HTN, chronic low back pain she  presents for   1. HTN:  She is compliant with losartan 100 mg daily. She denies leg swelling. She denies HA, CP and SOB.   2. L shoulder pain: started  11/2015 after falling out of a chair. Was initially intermittent but became fairly constant requiring tramadol. She was evaluated by ortho on 04/21/2017, in office shoulder x-ray was negative. She was treated with  subacromial injection for suspect rotator cuff impingement. She reports pain improved for one day, then returned. She takes daily meloxicam and tramadol as needed for pain.   3. R great toe discoloration: she is s/p R hallux toenail removal by podiatry on 04/21/2017. Her nail was removed due to pain. She denies pain currently. She reports clear drainage from her nail bed. She has podiatry follow up tomorrow.    Social History  Substance Use Topics  . Smoking status: Never Smoker  . Smokeless tobacco: Never Used  . Alcohol use Yes     Comment: rarely    Outpatient Medications Prior to Visit  Medication Sig Dispense Refill  . ferrous sulfate 325 (65 FE) MG tablet Take 325 mg by mouth 2 (two) times daily with a meal.    . losartan (COZAAR) 100 MG tablet Take 1 tablet (100 mg total) by mouth daily. 30 tablet 5  . meloxicam (MOBIC) 15 MG tablet Take 1 tablet (15 mg total) by mouth daily. 30 tablet 5  . Multiple Vitamins-Minerals (HAIR SKIN AND NAILS FORMULA PO) Take 2 each by mouth every morning. Reported on 03/18/2016    . pantoprazole (PROTONIX) 20 MG tablet Take 1 tablet (20 mg total) by mouth daily. 30 tablet 2  . traMADol (ULTRAM) 50 MG tablet Take 1 tablet (50 mg total) by mouth every 8 (eight) hours as needed. 60 tablet 2   No facility-administered medications prior to visit.     ROS Review of Systems  Constitutional: Negative  for chills and fever.  Eyes: Negative for visual disturbance.  Respiratory: Negative for shortness of breath.   Cardiovascular: Negative for chest pain ( ).  Gastrointestinal: Negative for abdominal pain and blood in stool.  Genitourinary: Negative for flank pain.  Musculoskeletal: Positive for arthralgias.  Skin: Negative for rash.  Allergic/Immunologic: Negative for immunocompromised state.  Hematological: Negative for adenopathy. Does not bruise/bleed easily.  Psychiatric/Behavioral: Negative for dysphoric mood and suicidal ideas.    Objective:  BP 134/78   Pulse 72   Temp 98.2 F (36.8 C) (Oral)   Wt 236 lb 9.6 oz (107.3 kg)   SpO2 98%   BMI 41.91 kg/m   BP/Weight 05/01/2017 2/69/4854 04/05/7034  Systolic BP 009 381 829  Diastolic BP 78 80 76  Wt. (Lbs) 236.6 240.2 241.4  BMI 41.91 42.55 42.76   Wt Readings from Last 3 Encounters:  03/20/17 240 lb 3.2 oz (109 kg)  02/06/17 241 lb 6.4 oz (109.5 kg)  12/26/16 238 lb 12.8 oz (108.3 kg)    Physical Exam  Constitutional: She is oriented to person, place, and time. She appears well-developed and well-nourished. No distress.  Obese   HENT:  Head: Normocephalic and atraumatic.  Cardiovascular: Normal rate, regular rhythm, normal heart sounds and intact distal pulses.   Pulmonary/Chest: Effort normal and breath sounds normal.  Abdominal: Soft. Bowel sounds are normal.  She exhibits no distension and no mass. There is no tenderness. There is no rebound, no guarding and no CVA tenderness.  Musculoskeletal: She exhibits no edema.       Left shoulder: She exhibits decreased range of motion, tenderness (anterior and lateral ) and pain. She exhibits no spasm, normal pulse and normal strength.       Feet:     Neurological: She is alert and oriented to person, place, and time.  Skin: Skin is warm and dry. No rash noted.  Psychiatric: She has a normal mood and affect.   Lab Results  Component Value Date   HGBA1C 5.2 06/21/2016     Assessment & Plan:  Angela Castillo was seen today for hypertension.  Diagnoses and all orders for this visit:  Other iron deficiency anemia -     ferrous sulfate 325 (65 FE) MG tablet; Take 1 tablet (325 mg total) by mouth 2 (two) times daily with a meal. -     CBC  Essential hypertension -     losartan (COZAAR) 100 MG tablet; Take 1 tablet (100 mg total) by mouth daily. -     CMP14+EGFR  Chronic bilateral low back pain without sciatica -     meloxicam (MOBIC) 15 MG tablet; Take 1 tablet (15 mg total) by mouth daily.  Chronic left shoulder pain -     meloxicam (MOBIC) 15 MG tablet; Take 1 tablet (15 mg total) by mouth daily. -     traMADol (ULTRAM) 50 MG tablet; Take 1 tablet (50 mg total) by mouth every 8 (eight) hours as needed.    Follow-up: No Follow-up on file.   Boykin Nearing MD

## 2017-05-01 ENCOUNTER — Encounter: Payer: Self-pay | Admitting: Family Medicine

## 2017-05-01 ENCOUNTER — Ambulatory Visit: Payer: BLUE CROSS/BLUE SHIELD | Attending: Family Medicine | Admitting: Family Medicine

## 2017-05-01 VITALS — BP 134/78 | HR 72 | Temp 98.2°F | Wt 236.6 lb

## 2017-05-01 DIAGNOSIS — L608 Other nail disorders: Secondary | ICD-10-CM | POA: Diagnosis not present

## 2017-05-01 DIAGNOSIS — I10 Essential (primary) hypertension: Secondary | ICD-10-CM | POA: Insufficient documentation

## 2017-05-01 DIAGNOSIS — M545 Low back pain, unspecified: Secondary | ICD-10-CM

## 2017-05-01 DIAGNOSIS — M25512 Pain in left shoulder: Secondary | ICD-10-CM | POA: Diagnosis not present

## 2017-05-01 DIAGNOSIS — G8929 Other chronic pain: Secondary | ICD-10-CM | POA: Diagnosis not present

## 2017-05-01 DIAGNOSIS — E669 Obesity, unspecified: Secondary | ICD-10-CM | POA: Diagnosis not present

## 2017-05-01 DIAGNOSIS — D508 Other iron deficiency anemias: Secondary | ICD-10-CM

## 2017-05-01 MED ORDER — TRAMADOL HCL 50 MG PO TABS: 50.0000 mg | ORAL_TABLET | Freq: Three times a day (TID) | ORAL | 2 refills | Status: DC | PRN

## 2017-05-01 MED ORDER — FERROUS SULFATE 325 (65 FE) MG PO TABS: 325.0000 mg | ORAL_TABLET | Freq: Two times a day (BID) | ORAL | 5 refills | Status: DC

## 2017-05-01 MED ORDER — MELOXICAM 15 MG PO TABS: 15.0000 mg | ORAL_TABLET | Freq: Every day | ORAL | 5 refills | Status: DC

## 2017-05-01 MED ORDER — LOSARTAN POTASSIUM 100 MG PO TABS: 100.0000 mg | ORAL_TABLET | Freq: Every day | ORAL | 5 refills | Status: DC

## 2017-05-01 NOTE — Assessment & Plan Note (Signed)
A: persistent pain, short term response to steroid injection P: Continue daily mobic and prn tramadol Patient will need ortho f/u in [place]

## 2017-05-01 NOTE — Patient Instructions (Addendum)
Angela Castillo was seen today for hypertension.  Diagnoses and all orders for this visit:  Other iron deficiency anemia -     ferrous sulfate 325 (65 FE) MG tablet; Take 1 tablet (325 mg total) by mouth 2 (two) times daily with a meal. -     CBC  Essential hypertension -     losartan (COZAAR) 100 MG tablet; Take 1 tablet (100 mg total) by mouth daily. -     CMP14+EGFR  Chronic bilateral low back pain without sciatica -     meloxicam (MOBIC) 15 MG tablet; Take 1 tablet (15 mg total) by mouth daily.  Chronic left shoulder pain -     meloxicam (MOBIC) 15 MG tablet; Take 1 tablet (15 mg total) by mouth daily. -     traMADol (ULTRAM) 50 MG tablet; Take 1 tablet (50 mg total) by mouth every 8 (eight) hours as needed.   Best wishes on your move to [place]. Be sure to establish with a new primary care provider for ortho referral and HTN management as soon as possible  Dr. Adrian Blackwater

## 2017-05-01 NOTE — Assessment & Plan Note (Signed)
A: well controlled Med: compliant with losartan 100 mg daily P: Continue current regimen CMP She is moving to [place] in 2 days Sent in refill for 6 months

## 2017-05-02 ENCOUNTER — Ambulatory Visit (INDEPENDENT_AMBULATORY_CARE_PROVIDER_SITE_OTHER): Payer: BLUE CROSS/BLUE SHIELD | Admitting: Podiatry

## 2017-05-02 ENCOUNTER — Encounter: Payer: Self-pay | Admitting: Podiatry

## 2017-05-02 DIAGNOSIS — B351 Tinea unguium: Secondary | ICD-10-CM

## 2017-05-02 LAB — CMP14+EGFR
ALT: 35 IU/L — ABNORMAL HIGH (ref 0–32)
AST: 24 IU/L (ref 0–40)
Albumin/Globulin Ratio: 1.3 (ref 1.2–2.2)
Albumin: 4.4 g/dL (ref 3.5–5.5)
Alkaline Phosphatase: 95 IU/L (ref 39–117)
BUN/Creatinine Ratio: 22 (ref 9–23)
BUN: 15 mg/dL (ref 6–24)
Bilirubin Total: 0.4 mg/dL (ref 0.0–1.2)
CO2: 21 mmol/L (ref 20–29)
Calcium: 9.4 mg/dL (ref 8.7–10.2)
Chloride: 104 mmol/L (ref 96–106)
Creatinine, Ser: 0.69 mg/dL (ref 0.57–1.00)
GFR calc Af Amer: 126 mL/min/{1.73_m2} (ref >59)
GFR calc non Af Amer: 109 mL/min/{1.73_m2} (ref >59)
Globulin, Total: 3.5 g/dL (ref 1.5–4.5)
Glucose: 111 mg/dL — ABNORMAL HIGH (ref 65–99)
Potassium: 4.2 mmol/L (ref 3.5–5.2)
Sodium: 139 mmol/L (ref 134–144)
Total Protein: 7.9 g/dL (ref 6.0–8.5)

## 2017-05-02 LAB — CBC
Hematocrit: 40.6 % (ref 34.0–46.6)
Hemoglobin: 13 g/dL (ref 11.1–15.9)
MCH: 28.3 pg (ref 26.6–33.0)
MCHC: 32 g/dL (ref 31.5–35.7)
MCV: 89 fL (ref 79–97)
Platelets: 147 10*3/uL — ABNORMAL LOW (ref 150–379)
RBC: 4.59 x10E6/uL (ref 3.77–5.28)
RDW: 14.4 % (ref 12.3–15.4)
WBC: 7.3 10*3/uL (ref 3.4–10.8)

## 2017-05-02 NOTE — Patient Instructions (Signed)

## 2017-05-08 ENCOUNTER — Telehealth: Payer: Self-pay

## 2017-05-08 NOTE — Telephone Encounter (Signed)
Pt was called and informed of lab results. 

## 2017-05-12 NOTE — Progress Notes (Signed)
Subjective: Angela Castillo is a 41 y.o.  female returns to office today for follow up evaluation after having righ Hallux total  nail avulsion performed. Patient has been soaking using epsom salts and applying topical antibiotic covered with bandaid daily. Patient denies fevers, chills, nausea, vomiting. Denies any calf pain, chest pain, SOB.   Objective:  Vitals: Reviewed  General: Well developed, nourished, in no acute distress, alert and oriented x3   Dermatology: Skin is warm, dry and supple bilateral. Right hallux nail bed appears to be clean, dry, with mild granular tissue and surrounding scab. There is no surrounding erythema, edema, drainage/purulence. The remaining nails appear unremarkable at this time. There are no other lesions or other signs of infection present.  Neurovascular status: Intact. No lower extremity swelling; No pain with calf compression bilateral.  Musculoskeletal: Decreased tenderness to palpation of the right hallux nail bed Muscular strength within normal limits bilateral.   Assesement and Plan: S/p partial nail avulsion, doing well.   -Continue soaking in epsom salts twice a day followed by antibiotic ointment and a band-aid. Can leave uncovered at night. Continue this until completely healed.  -If the area has not healed in 2 weeks, call the office for follow-up appointment, or sooner if any problems arise.  -ALT was slightly elevated discussed potential side effects of Lamisil for nail fungus. She'll to proceed with topical. I would have wear this for her she's not to start this until the nail starts to grow back in. Directed on usage.  -Monitor for any signs/symptoms of infection. Call the office immediately if any occur or go directly to the emergency room. Call with any questions/concerns.  Celesta Gentile, DPM

## 2017-07-15 ENCOUNTER — Telehealth: Payer: Self-pay | Admitting: General Practice

## 2017-07-15 DIAGNOSIS — I10 Essential (primary) hypertension: Secondary | ICD-10-CM

## 2017-07-15 MED ORDER — LOSARTAN POTASSIUM 100 MG PO TABS: 100.0000 mg | ORAL_TABLET | Freq: Every day | ORAL | 0 refills | Status: DC

## 2017-07-15 NOTE — Telephone Encounter (Signed)
Pt called since she is in [place] and need a refill for her BP can you auth a refill for  losartan (COZAAR) 100 MG tablet  Can you please sent the med to the [address], [facility] [id number] If you can't or will auth call the pt

## 2017-07-15 NOTE — Telephone Encounter (Signed)
Refilled losartan x 30 days - patient needs office visit to establish with new provider. Pharmacy will contact her when prescription is ready for pick up.

## 2017-09-14 ENCOUNTER — Other Ambulatory Visit: Payer: Self-pay | Admitting: Internal Medicine

## 2017-09-14 DIAGNOSIS — I10 Essential (primary) hypertension: Secondary | ICD-10-CM

## 2018-04-27 ENCOUNTER — Encounter (HOSPITAL_COMMUNITY): Payer: Self-pay | Admitting: Emergency Medicine

## 2018-04-27 ENCOUNTER — Emergency Department (HOSPITAL_COMMUNITY)
Admission: EM | Admit: 2018-04-27 | Discharge: 2018-04-27 | Disposition: A | Discharge status: Home / Self Care | Payer: BLUE CROSS/BLUE SHIELD | Attending: Emergency Medicine | Admitting: Emergency Medicine

## 2018-04-27 DIAGNOSIS — J029 Acute pharyngitis, unspecified: Secondary | ICD-10-CM | POA: Insufficient documentation

## 2018-04-27 DIAGNOSIS — Z760 Encounter for issue of repeat prescription: Secondary | ICD-10-CM | POA: Insufficient documentation

## 2018-04-27 DIAGNOSIS — J069 Acute upper respiratory infection, unspecified: Secondary | ICD-10-CM

## 2018-04-27 DIAGNOSIS — I10 Essential (primary) hypertension: Secondary | ICD-10-CM

## 2018-04-27 DIAGNOSIS — R0981 Nasal congestion: Secondary | ICD-10-CM

## 2018-04-27 DIAGNOSIS — B9789 Other viral agents as the cause of diseases classified elsewhere: Secondary | ICD-10-CM | POA: Insufficient documentation

## 2018-04-27 DIAGNOSIS — Z79899 Other long term (current) drug therapy: Secondary | ICD-10-CM | POA: Insufficient documentation

## 2018-04-27 MED ORDER — LOSARTAN POTASSIUM 100 MG PO TABS: 100.0000 mg | ORAL_TABLET | Freq: Every day | ORAL | 0 refills | Status: DC

## 2018-04-27 MED ORDER — BENZONATATE 200 MG PO CAPS
200.0000 mg | ORAL_CAPSULE | Freq: Three times a day (TID) | ORAL | 0 refills | Status: DC | PRN
Start: 2018-04-27 — End: 2019-03-29

## 2018-04-27 NOTE — Discharge Instructions (Addendum)
Continue to stay well-hydrated. Gargle warm salt water and spit it out and use chloraseptic spray as needed for sore throat. Continue to alternate between Tylenol and Ibuprofen for pain or fever. Use over the counter Coricidin HBP (or the other HBP medication which I think is Tylenol HBP) or REGULAR mucinex for cough suppression/expectoration of mucus and to help with symptoms. DO NOT USE OTHER DECONGESTANTS AS THIS WILL RAISE YOUR BLOOD PRESSURE. Use Tesslon pearls for cough suppression but only if absolutely necessary, such as if you're having those bronchospasm coughs we discussed, or if the coughing is interfering with sleep. Otherwise, just use the over the counter products we discussed. You may consider using over the counter netipot and flonase to help with nasal congestion. May consider over-the-counter Benadryl or other antihistamine to decrease secretions and for help with your symptoms. Follow up with your primary care doctor in 5-7 days for recheck of ongoing symptoms. Return to emergency department for emergent changing or worsening of symptoms.  Also, Your blood pressure was elevated today. Eat a low salt/low sodium diet, and take all of your home blood pressure medications as directed; this was refilled for you today as a one time courtesy however you will need to see your regular doctor for future refills. Keep a log of your blood pressure readings from every morning and evening (making sure to give yourself at least 15 minutes of rest prior to checking it) and take it to your doctor's office at your next appointment for ongoing management of your blood pressure. Stay well hydrated and get plenty of rest. Avoid caffeine and other over the counter products that would make your blood pressure go up (such as decongestants, excedrin, etc). Follow up with your regular doctor in 1 week for recheck of symptoms and ongoing management of your blood pressure. Return to the ER for emergent changes or  worsening symptoms.

## 2018-04-27 NOTE — ED Provider Notes (Signed)
[facility]-EMERGENCY DEPT Provider Note   CSN: [id number] Arrival date & time: 04/27/18  1957     History   Chief Complaint Chief Complaint  Patient presents with  . Cough  . Nasal Congestion    HPI KINZY WEYERS is a 42 y.o. female with a PMHx of HTN, anemia, L shoulder impingement syndrome, GERD, and other conditions listed below, who presents to the ED with complaints of URI symptoms x 1 week.  Patient states that she initially began having a sore throat last Monday, that has mostly resolved however she then started having sinus congestion and pressure as well as a productive cough (unsure what color the sputum is because she does not spit it out).  She has been using Vicks max with some relief of her symptoms, but has tried various cough syrups and DayQuil with no relief of her symptoms.  Activity or talking seems to aggravate her cough, she states that she will go into coughing fits where she feels like she cannot catch her breath in between the coughs until she can "get on top of the cough" and finally catch her breath.  She states that this will happen multiple times throughout the day mostly when she tries to talk or does any activities.  She is a non-smoker.  No known sick contacts.  She denies any fevers, chills, rhinorrhea, drooling, trismus, ear pain or drainage, wheezing, CP, SOB, abd pain, N/V/D/C, hematuria, dysuria, myalgias, arthralgias, numbness, tingling, focal weakness, or any other complaints at this time.  Of note, she has not had her blood pressure medication (Losartan 100mg  daily) since November because she lost her insurance and moved, then came back but hasn't been in to see her PCP yet to get refills.  She denies any vision changes, HA, or any other associated symptoms related to her HTN today.  She is planning on seeing her PCP soon to get refills, however is happy if we could provide her a refill today so she can start back on it.   The  history is provided by the patient and medical records. No language interpreter was used.  Cough  Associated symptoms include sore throat. Pertinent negatives include no chest pain, no chills, no ear pain, no headaches, no rhinorrhea, no myalgias, no shortness of breath and no wheezing.    Past Medical History:  Diagnosis Date  . Anemia Dx January 03 2015  . Elevated liver enzymes   . Hypertension Dx Apr 2016  . Reflux     Patient Active Problem List   Diagnosis Date Noted  . Impingement syndrome of left shoulder 04/21/2017  . Low back pain 03/18/2016  . Left shoulder pain 03/18/2016  . Esophageal reflux 03/18/2016  . Lateral pain of left hip 10/25/2015  . Heel pain, bilateral 05/03/2015  . Obesity (BMI 30-39.9) 05/03/2015  . Hearing loss in right ear 03/01/2015  . Family history of diabetes mellitus in mother 03/01/2015  . Essential hypertension 01/18/2015  . Anemia, iron deficiency 01/04/2015  . Musculoskeletal chest pain 01/04/2015  . Transaminitis 01/03/2015    Past Surgical History:  Procedure Laterality Date  . TUBAL LIGATION  Jul 25, 2000     OB History   None      Home Medications    Prior to Admission medications   Medication Sig Start Date End Date Taking? Authorizing Provider  ferrous sulfate 325 (65 FE) MG tablet Take 1 tablet (325 mg total) by mouth 2 (two) times daily with  a meal. 05/01/17   Funches, Gerilyn Nestle, MD  losartan (COZAAR) 100 MG tablet TAKE 1 TABLET BY MOUTH ONCE DAILY 09/15/17   Quentin Angst, MD  meloxicam (MOBIC) 15 MG tablet Take 1 tablet (15 mg total) by mouth daily. 05/01/17   Funches, Gerilyn Nestle, MD  Multiple Vitamins-Minerals (HAIR SKIN AND NAILS FORMULA PO) Take 2 each by mouth every morning. Reported on 03/18/2016    [provider]  NONFORMULARY OR COMPOUNDED ITEM [facility]  Onychomycosis Nail Lacquer -  Fluconazole 2%, Terbinafine 1% DMSO Apply to affected nail once daily Qty. 120 gm 3 refills    [provider]  traMADol (ULTRAM) 50 MG tablet Take 1 tablet (50 mg total) by mouth every 8 (eight) hours as needed. 05/01/17   Dessa Phi, MD    Family History Family History  Problem Relation Age of Onset  . COPD Mother   . Esophageal cancer Mother   . Diabetes Mother   . Cancer Mother   . Hypertension Mother   . Diabetes Father     Social History Social History   Tobacco Use  . Smoking status: Never Smoker  . Smokeless tobacco: Never Used  Substance Use Topics  . Alcohol use: Yes    Comment: rarely  . Drug use: No     Allergies   Patient has no known allergies.   Review of Systems Review of Systems  Constitutional: Negative for chills and fever.  HENT: Positive for congestion, sinus pressure and sore throat. Negative for drooling, ear discharge, ear pain, rhinorrhea and trouble swallowing.   Eyes: Negative for visual disturbance.  Respiratory: Positive for cough. Negative for shortness of breath and wheezing.   Cardiovascular: Negative for chest pain.  Gastrointestinal: Negative for abdominal pain, constipation, diarrhea, nausea and vomiting.  Genitourinary: Negative for dysuria and hematuria.  Musculoskeletal: Negative for arthralgias and myalgias.  Skin: Negative for color change.  Allergic/Immunologic: Negative for immunocompromised state.  Neurological: Negative for weakness, numbness and headaches.  Psychiatric/Behavioral: Negative for confusion.   All other systems reviewed and are negative for acute change except as noted in the HPI.    Physical Exam Updated Vital Signs BP (!) 170/98 (BP Location: Right Arm)   Pulse 98   Temp 99.2 F (37.3 C) (Oral)   Resp 20   SpO2 98%    Physical Exam  Constitutional: She is oriented to person, place, and time. Vital signs are normal. She appears well-developed and well-nourished.  Non-toxic appearance. No distress.  Afebrile, nontoxic, NAD, HTN noted which is slightly higher than some of her prior visits,  but close to a few outpatient visits in the past  HENT:  Head: Normocephalic and atraumatic.  Nose: Mucosal edema present.  Mouth/Throat: Uvula is midline, oropharynx is clear and moist and mucous membranes are normal. No trismus in the jaw. No uvula swelling. Tonsils are 0 on the right. Tonsils are 0 on the left. No tonsillar exudate.  Nose congested without rhinorrhea. Oropharynx clear and moist, without uvular swelling or deviation, no trismus or drooling, no tonsillar swelling or erythema, no exudates.    Eyes: Conjunctivae and EOM are normal. Right eye exhibits no discharge. Left eye exhibits no discharge.  Neck: Normal range of motion. Neck supple.  Cardiovascular: Normal rate, regular rhythm, normal heart sounds and intact distal pulses. Exam reveals no gallop and no friction rub.  No murmur heard. Pulmonary/Chest: Effort normal and breath sounds normal. No respiratory distress. She has no decreased breath sounds. She has  no wheezes. She has no rhonchi. She has no rales.  CTAB in all lung fields, no w/r/r, no hypoxia or increased WOB, speaking in full sentences, SpO2 98% on RA   Abdominal: Soft. Normal appearance and bowel sounds are normal. She exhibits no distension. There is no tenderness. There is no rigidity, no rebound, no guarding, no CVA tenderness, no tenderness at McBurney's point and negative Murphy's sign.  Musculoskeletal: Normal range of motion.  Neurological: She is alert and oriented to person, place, and time. She has normal strength. No sensory deficit.  Skin: Skin is warm, dry and intact. No rash noted.  Psychiatric: She has a normal mood and affect.  Nursing note and vitals reviewed.    ED Treatments / Results  Labs (all labs ordered are listed, but only abnormal results are displayed) Labs Reviewed - No data to display  EKG None  Radiology No results found.  Procedures Procedures (including critical care time)  Medications Ordered in ED Medications - No  data to display   Initial Impression / Assessment and Plan / ED Course  I have reviewed the triage vital signs and the nursing notes.  Pertinent labs & imaging results that were available during my care of the patient were reviewed by me and considered in my medical decision making (see chart for details).     42 y.o. female here with cough/sore throat/congestion x1wk. On exam, Pt is afebrile with a clear lung exam. Mild nasal congestion; throat clear. Likely viral URI. Doubt need for CXR or other emergent work up at this time. She's describing a somewhat spastic/bronchospasmodic cough which causes her to cough repetitively without being able to get a breath sometimes; I discussed with her that coughing is the body's way of getting rid of mucus etc however in this case I think it's reasonable to suppress it some; will rx tessalon perles but discussed using as little as absolutely necessary. Advised other OTC remedies for symptomatic relief. Of note, pt also with HTN, hasn't been on her meds x7 months because of moving and loss of insurance, etc; she is asymptomatic at this time, doubt need for further emergent work up at this time. Will provide one time courtesy refill of her losartan, advised DASH diet and avoidance of OTC things that would raise her BP; discussed keeping a log of BPs to take to next PCP visit. F/up with PCP in 1wk for recheck of symptoms and ongoing management of her BP. I explained the diagnosis and have given explicit precautions to return to the ER including for any other new or worsening symptoms. The patient understands and accepts the medical plan as it's been dictated and I have answered their questions. Discharge instructions concerning home care and prescriptions have been given. The patient is STABLE and is discharged to home in good condition.    Final Clinical Impressions(s) / ED Diagnoses   Final diagnoses:  Viral URI with cough  Sore throat  Sinus congestion    Essential hypertension  Medication refill    ED Discharge Orders        Ordered    benzonatate (TESSALON) 200 MG capsule  3 times daily PRN     04/27/18 2100    losartan (COZAAR) 100 MG tablet  Daily     04/27/18 [address]       Levina Boyack, [place], [place] 04/27/18 2110    Lorre NickAllen, Anthony, MD 04/28/18 1752

## 2018-04-27 NOTE — ED Triage Notes (Signed)
Patient c/o productive cough and congestion x2 days.

## 2018-06-02 IMAGING — US US ABDOMEN LIMITED
1 series · 14 of 25 positions shown · non-contrast
Comparison: Ultrasound [DATE]

CLINICAL DATA: Right upper quadrant pain for 2 days

EXAM:
ULTRASOUND ABDOMEN LIMITED RIGHT UPPER QUADRANT

[Series 1: us abdomen limited · 0.23mm/px · 14 of 47 slices shown]
[im 1/47]
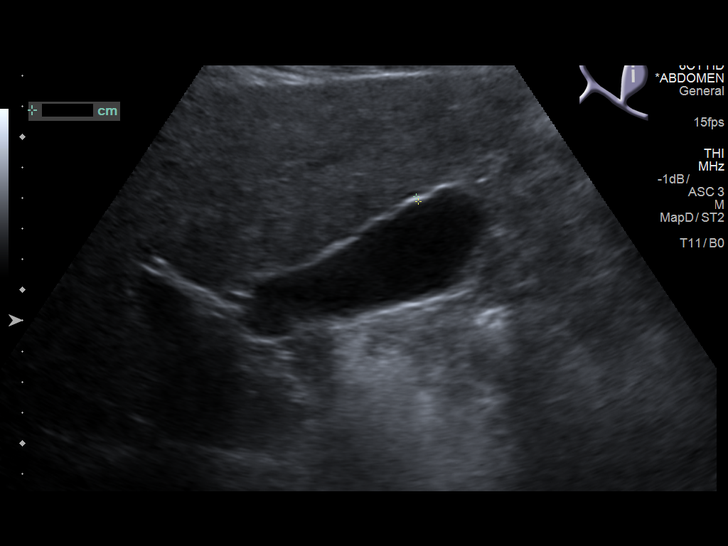
[im 4/47]
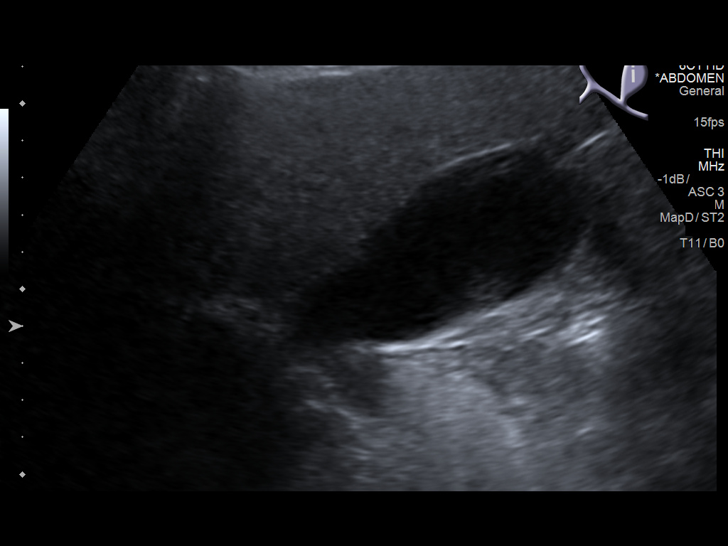
[im 8/47]
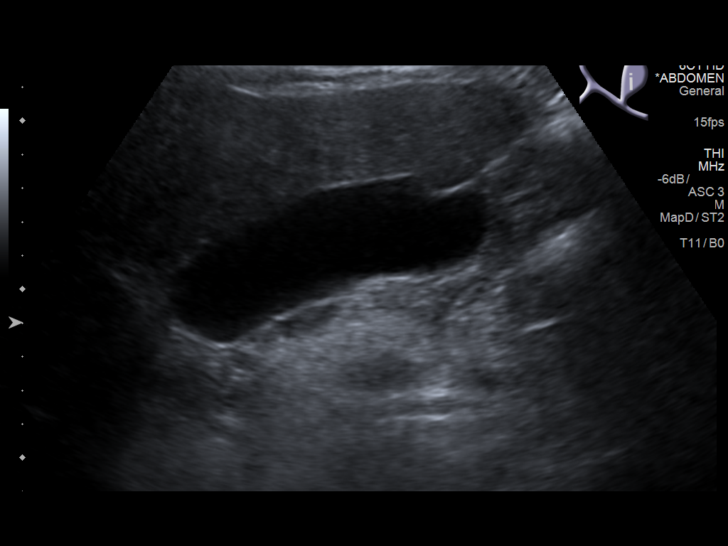
[im 12/47]
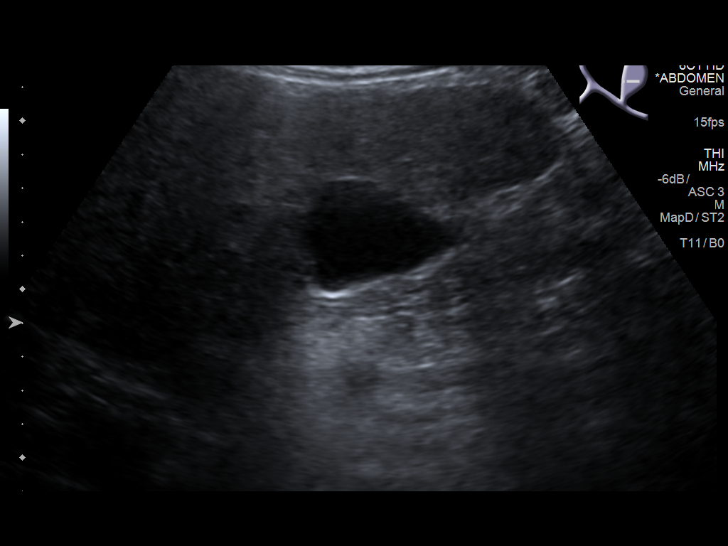
[im 16/47]
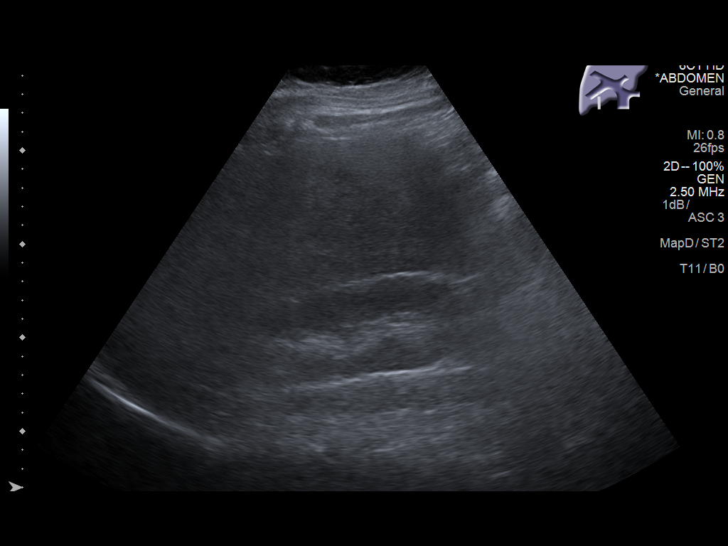
[im 18/47]
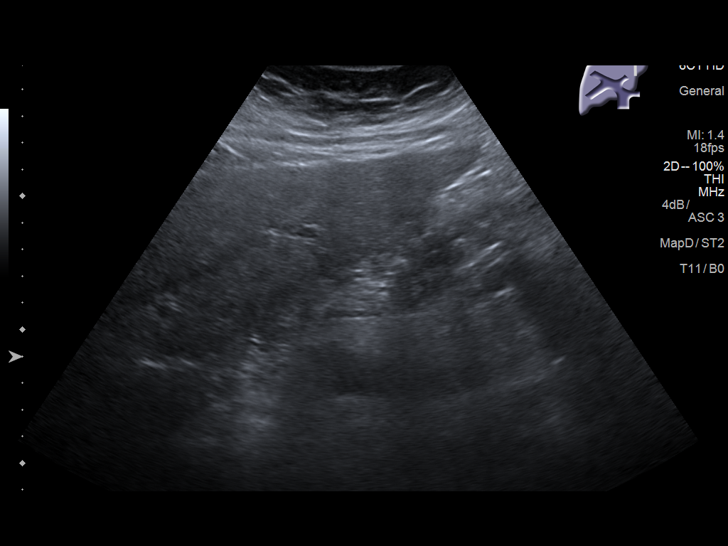
[im 22/47]
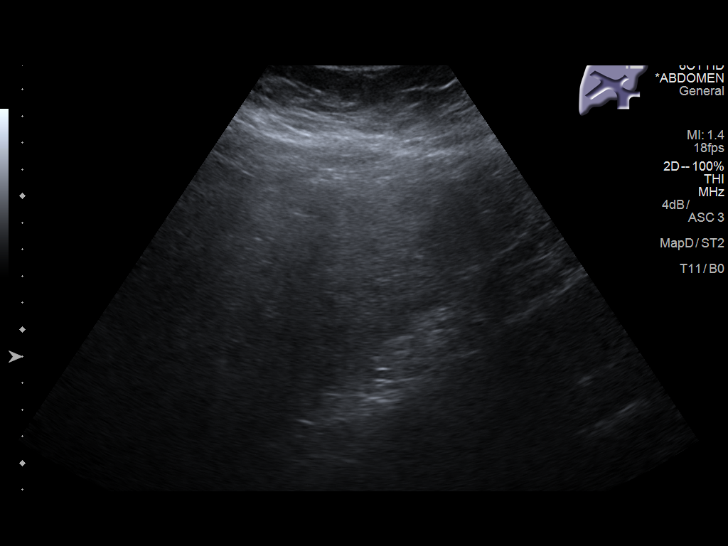
[im 25/47]
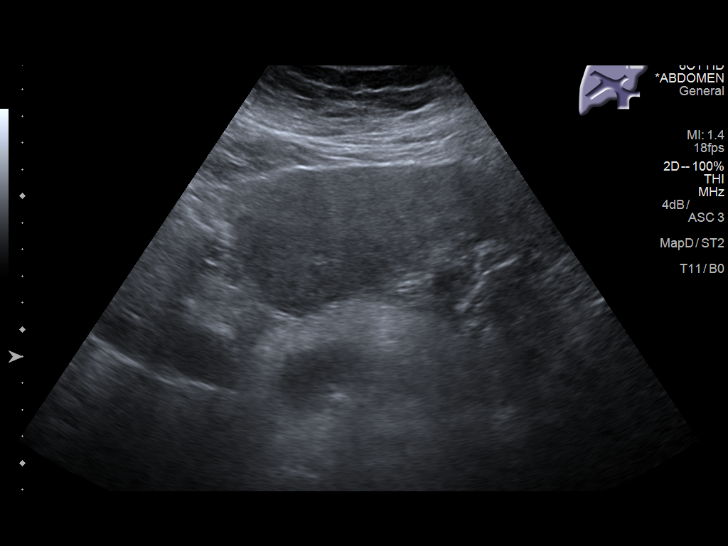
[im 29/47]
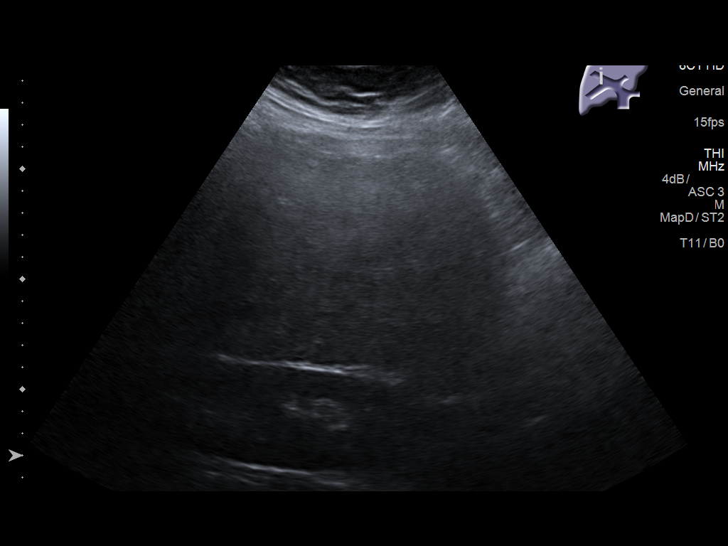
[im 31/47]
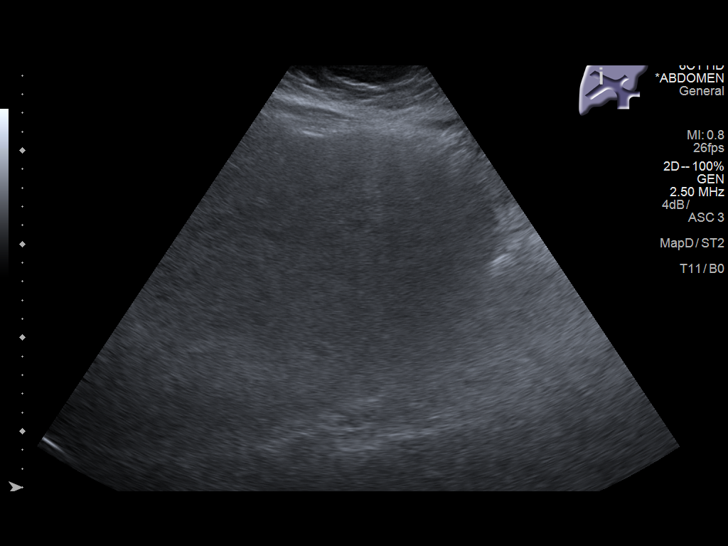
[im 35/47]
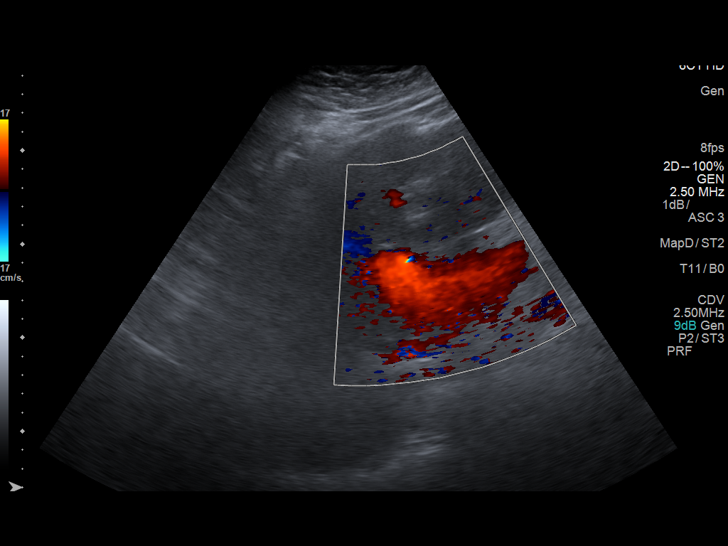
[im 39/47]
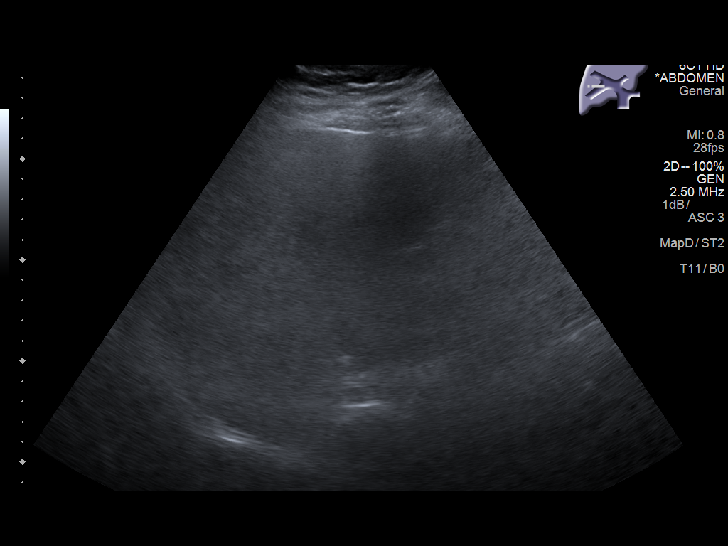
[im 43/47]
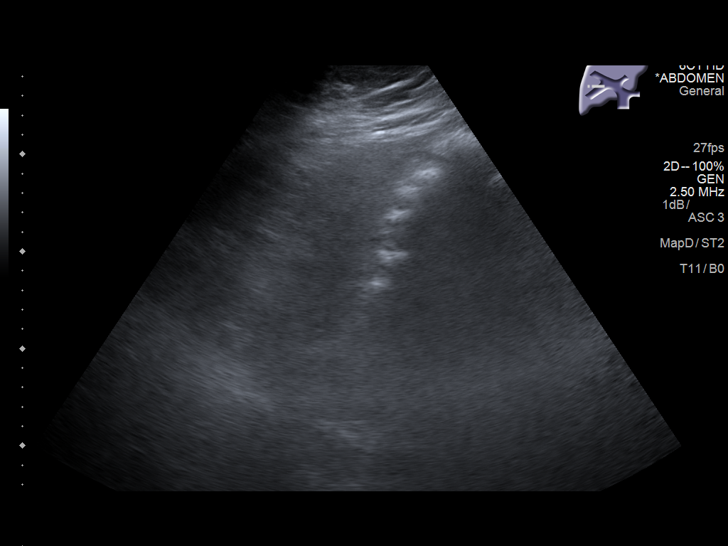
[im 47/47]
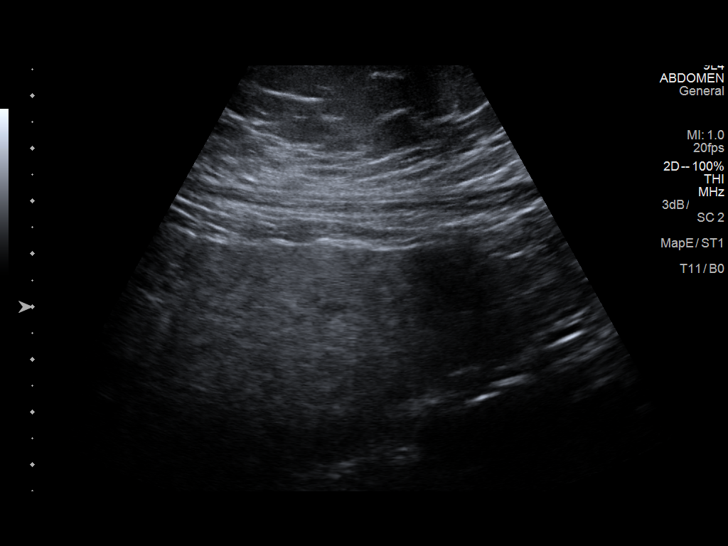

[14 of 25 positions shown; findings below may reference images not displayed]

FINDINGS: Gallbladder:

Small amount of sludge in the gallbladder. Normal wall thickness. No
shadowing stones. Negative sonographic Murphy.

Common bile duct:

Diameter: 1.3 mm

Liver:

Echogenic liver with heterogeneous echotexture and nodular contour.
No focal hepatic abnormality. Portal vein is patent on color Doppler
imaging with normal direction of blood flow towards the liver.
IMPRESSION: 1. Small amount of sludge in the gallbladder. Negative for acute
cholecystitis or biliary dilatation
2. Heterogeneous echogenic liver with nodular contour suspicious for
cirrhosis

## 2018-07-13 ENCOUNTER — Encounter (HOSPITAL_BASED_OUTPATIENT_CLINIC_OR_DEPARTMENT_OTHER): Payer: Self-pay

## 2018-07-13 ENCOUNTER — Emergency Department (HOSPITAL_BASED_OUTPATIENT_CLINIC_OR_DEPARTMENT_OTHER)
Admission: EM | Admit: 2018-07-13 | Discharge: 2018-07-13 | Disposition: A | Discharge status: Home / Self Care | Payer: Self-pay | Attending: Emergency Medicine | Admitting: Emergency Medicine

## 2018-07-13 ENCOUNTER — Emergency Department (HOSPITAL_BASED_OUTPATIENT_CLINIC_OR_DEPARTMENT_OTHER): Payer: Self-pay

## 2018-07-13 ENCOUNTER — Other Ambulatory Visit: Payer: Self-pay

## 2018-07-13 DIAGNOSIS — Y939 Activity, unspecified: Secondary | ICD-10-CM | POA: Insufficient documentation

## 2018-07-13 DIAGNOSIS — Y929 Unspecified place or not applicable: Secondary | ICD-10-CM | POA: Insufficient documentation

## 2018-07-13 DIAGNOSIS — S39012A Strain of muscle, fascia and tendon of lower back, initial encounter: Secondary | ICD-10-CM | POA: Insufficient documentation

## 2018-07-13 DIAGNOSIS — R748 Abnormal levels of other serum enzymes: Secondary | ICD-10-CM | POA: Insufficient documentation

## 2018-07-13 DIAGNOSIS — X58XXXA Exposure to other specified factors, initial encounter: Secondary | ICD-10-CM | POA: Insufficient documentation

## 2018-07-13 DIAGNOSIS — Y998 Other external cause status: Secondary | ICD-10-CM | POA: Insufficient documentation

## 2018-07-13 DIAGNOSIS — Z79899 Other long term (current) drug therapy: Secondary | ICD-10-CM | POA: Insufficient documentation

## 2018-07-13 DIAGNOSIS — I10 Essential (primary) hypertension: Secondary | ICD-10-CM | POA: Insufficient documentation

## 2018-07-13 LAB — CBC WITH DIFFERENTIAL/PLATELET
Basophils Absolute: 0 10*3/uL (ref 0.0–0.1)
Basophils Relative: 0 %
Eosinophils Absolute: 0.1 10*3/uL (ref 0.0–0.7)
Eosinophils Relative: 2 %
HCT: 40.8 % (ref 36.0–46.0)
Hemoglobin: 13.1 g/dL (ref 12.0–15.0)
Lymphocytes Relative: 25 %
Lymphs Abs: 1.6 10*3/uL (ref 0.7–4.0)
MCH: 27.2 pg (ref 26.0–34.0)
MCHC: 32.1 g/dL (ref 30.0–36.0)
MCV: 84.8 fL (ref 78.0–100.0)
Monocytes Absolute: 0.4 10*3/uL (ref 0.1–1.0)
Monocytes Relative: 6 %
Neutro Abs: 4.3 10*3/uL (ref 1.7–7.7)
Neutrophils Relative %: 67 %
Platelets: 110 10*3/uL — ABNORMAL LOW (ref 150–400)
RBC: 4.81 MIL/uL (ref 3.87–5.11)
RDW: 14.6 % (ref 11.5–15.5)
WBC: 6.4 10*3/uL (ref 4.0–10.5)

## 2018-07-13 LAB — URINALYSIS, ROUTINE W REFLEX MICROSCOPIC
Bilirubin Urine: NEGATIVE
Glucose, UA: NEGATIVE mg/dL
Hgb urine dipstick: NEGATIVE
Ketones, ur: NEGATIVE mg/dL
Leukocytes, UA: NEGATIVE
Nitrite: NEGATIVE
Protein, ur: NEGATIVE mg/dL
Specific Gravity, Urine: 1.025 (ref 1.005–1.030)
pH: 6.5 (ref 5.0–8.0)

## 2018-07-13 LAB — LIPASE, BLOOD: Lipase: 26 U/L (ref 11–51)

## 2018-07-13 LAB — COMPREHENSIVE METABOLIC PANEL
ALT: 62 U/L — ABNORMAL HIGH (ref 0–44)
AST: 61 U/L — ABNORMAL HIGH (ref 15–41)
Albumin: 4.1 g/dL (ref 3.5–5.0)
Alkaline Phosphatase: 120 U/L (ref 38–126)
Anion gap: 11 (ref 5–15)
BUN: 10 mg/dL (ref 6–20)
CO2: 25 mmol/L (ref 22–32)
Calcium: 9.1 mg/dL (ref 8.9–10.3)
Chloride: 102 mmol/L (ref 98–111)
Creatinine, Ser: 0.43 mg/dL — ABNORMAL LOW (ref 0.44–1.00)
GFR calc Af Amer: >60 mL/min (ref >60)
GFR calc non Af Amer: >60 mL/min (ref >60)
Glucose, Bld: 105 mg/dL — ABNORMAL HIGH (ref 70–99)
Potassium: 3.7 mmol/L (ref 3.5–5.1)
Sodium: 138 mmol/L (ref 135–145)
Total Bilirubin: 0.7 mg/dL (ref 0.3–1.2)
Total Protein: 8.9 g/dL — ABNORMAL HIGH (ref 6.5–8.1)

## 2018-07-13 LAB — PREGNANCY, URINE: Preg Test, Ur: NEGATIVE

## 2018-07-13 MED ORDER — IBUPROFEN 600 MG PO TABS: 600.0000 mg | ORAL_TABLET | Freq: Four times a day (QID) | ORAL | 0 refills | Status: DC | PRN

## 2018-07-13 MED ORDER — METHOCARBAMOL 500 MG PO TABS: 500.0000 mg | ORAL_TABLET | Freq: Three times a day (TID) | ORAL | 0 refills | Status: DC | PRN

## 2018-07-13 NOTE — ED Notes (Signed)
Pt teaching provided on medications that may cause drowsiness. Pt instructed not to drive or operate heavy machinery while taking the prescribed medication. Pt verbalized understanding.   

## 2018-07-13 NOTE — Discharge Instructions (Signed)
Avoid Tylenol and alcohol.  Take medication as prescribed.  Follow-up with gastroenterology.  Return immediately for worsening pain, persistent vomiting, fever or for any concerns.

## 2018-07-13 NOTE — ED Notes (Signed)
Pt. Reports she has had L side pain in the "kidney"  Area.  Pt. Said she also has a pain off and on in the mid abd. That she can drink water and rid the pain.

## 2018-07-13 NOTE — ED Notes (Signed)
Pt. Walking with [place]asmine [place] with no difficulty.  Pt. Has been using cell phone with no difficulty.

## 2018-07-13 NOTE — ED Notes (Signed)
Urine culture sent with urine sample. 

## 2018-07-13 NOTE — ED Triage Notes (Signed)
Bilateral back pain, states it radiates around her left side for the last several weeks, no difficulty urinating or gross hematuria, took 400mg  advil this morning without relief

## 2018-07-13 NOTE — ED Provider Notes (Signed)
[facility] EMERGENCY DEPARTMENT Provider Note   CSN: [id number] Arrival date & time: 07/13/18  1701     History   Chief Complaint Chief Complaint  Patient presents with  . Back Pain    HPI TORRI MICHALSKI is a 42 y.o. female.  HPI Patient presents with 2 weeks of low back pain.  States the pain started on the left and then has radiated over to the right.  States she occasionally has pain that radiates from the right side to the right upper abdomen.  Not associated with food.  No nausea or vomiting.  Has normal bowel movements.  Denies any radiation of the pain down her legs.  No focal weakness or numbness.  No urinary symptoms including dysuria, frequency or urgency.  No hematuria.  No incontinence.  No focal weakness or numbness.  Patient states she has been taking ibuprofen as needed for the pain. Past Medical History:  Diagnosis Date  . Anemia Dx January 03 2015  . Elevated liver enzymes   . Hypertension Dx Apr 2016  . Reflux     Patient Active Problem List   Diagnosis Date Noted  . Impingement syndrome of left shoulder 04/21/2017  . Low back pain 03/18/2016  . Left shoulder pain 03/18/2016  . Esophageal reflux 03/18/2016  . Lateral pain of left hip 10/25/2015  . Heel pain, bilateral 05/03/2015  . Obesity (BMI 30-39.9) 05/03/2015  . Hearing loss in right ear 03/01/2015  . Family history of diabetes mellitus in mother 03/01/2015  . Essential hypertension 01/18/2015  . Anemia, iron deficiency 01/04/2015  . Musculoskeletal chest pain 01/04/2015  . Transaminitis 01/03/2015    Past Surgical History:  Procedure Laterality Date  . TUBAL LIGATION  Jul 25, 2000     OB History   None      Home Medications    Prior to Admission medications   Medication Sig Start Date End Date Taking? Authorizing Provider  benzonatate (TESSALON) 200 MG capsule Take 1 capsule (200 mg total) by mouth 3 (three) times daily as needed for cough. 04/27/18   Street, [place],  PA-C  ferrous sulfate 325 (65 FE) MG tablet Take 1 tablet (325 mg total) by mouth 2 (two) times daily with a meal. 05/01/17   Funches, Gerilyn Nestle, MD  ibuprofen (ADVIL,MOTRIN) 600 MG tablet Take 1 tablet (600 mg total) by mouth every 6 (six) hours as needed for moderate pain. 07/13/18   Loren Racer, MD  losartan (COZAAR) 100 MG tablet TAKE 1 TABLET BY MOUTH ONCE DAILY 09/15/17   Quentin Angst, MD  losartan (COZAAR) 100 MG tablet Take 1 tablet (100 mg total) by mouth daily. 04/27/18   Street, [place], PA-C  meloxicam (MOBIC) 15 MG tablet Take 1 tablet (15 mg total) by mouth daily. 05/01/17   Funches, Gerilyn Nestle, MD  methocarbamol (ROBAXIN) 500 MG tablet Take 1 tablet (500 mg total) by mouth every 8 (eight) hours as needed for muscle spasms. 07/13/18   Loren Racer, MD  Multiple Vitamins-Minerals (HAIR SKIN AND NAILS FORMULA PO) Take 2 each by mouth every morning. Reported on 03/18/2016    [provider]  NONFORMULARY OR COMPOUNDED ITEM [facility]  Onychomycosis Nail Lacquer -  Fluconazole 2%, Terbinafine 1% DMSO Apply to affected nail once daily Qty. 120 gm 3 refills    [provider]  traMADol (ULTRAM) 50 MG tablet Take 1 tablet (50 mg total) by mouth every 8 (eight) hours as needed. 05/01/17   Dessa Phi, MD  Family History Family History  Problem Relation Age of Onset  . COPD Mother   . Esophageal cancer Mother   . Diabetes Mother   . Cancer Mother   . Hypertension Mother   . Diabetes Father     Social History Social History   Tobacco Use  . Smoking status: Never Smoker  . Smokeless tobacco: Never Used  Substance Use Topics  . Alcohol use: Yes    Comment: rarely  . Drug use: No     Allergies   Patient has no known allergies.   Review of Systems Review of Systems  Constitutional: Negative for chills and fever.  Respiratory: Negative for cough and shortness of breath.   Cardiovascular: Negative for chest pain.  Gastrointestinal:  Positive for abdominal pain. Negative for diarrhea, nausea and vomiting.  Genitourinary: Positive for flank pain. Negative for difficulty urinating, dysuria, frequency, hematuria and pelvic pain.  Musculoskeletal: Positive for back pain and myalgias. Negative for neck pain.  Skin: Negative for rash and wound.  Neurological: Negative for dizziness, weakness, light-headedness, numbness and headaches.  All other systems reviewed and are negative.    Physical Exam Updated Vital Signs BP (!) 174/107 (BP Location: Left Arm)   Pulse 87   Temp 98.2 F (36.8 C) (Oral)   Resp 17   Ht 5\' 3"  (1.6 m)   Wt 112.9 kg   LMP 07/06/2018   SpO2 100%   BMI 44.11 kg/m   Physical Exam  Constitutional: She is oriented to person, place, and time. She appears well-developed and well-nourished. No distress.  HENT:  Head: Normocephalic and atraumatic.  Mouth/Throat: Oropharynx is clear and moist.  Eyes: Pupils are equal, round, and reactive to light. EOM are normal.  Neck: Normal range of motion. Neck supple.  Cardiovascular: Normal rate and regular rhythm.  Pulmonary/Chest: Effort normal and breath sounds normal.  Abdominal: Soft. Bowel sounds are normal. There is no tenderness. There is no rebound and no guarding.  Musculoskeletal: Normal range of motion. She exhibits tenderness. She exhibits no edema.  Bilateral right greater than left lumbar paraspinal tenderness to palpation.  No CVA tenderness.  No lower extremity swelling, asymmetry or tenderness.  Distal pulses are 2+.  Neurological: She is alert and oriented to person, place, and time.  5/5 motor in all extremities.  Sensation fully intact.  No saddle anesthesia.  Skin: Skin is warm and dry. Capillary refill takes less than 2 seconds. No rash noted. She is not diaphoretic. No erythema.  Psychiatric: She has a normal mood and affect. Her behavior is normal.  Nursing note and vitals reviewed.    ED Treatments / Results  Labs (all labs ordered  are listed, but only abnormal results are displayed) Labs Reviewed  CBC WITH DIFFERENTIAL/PLATELET - Abnormal; Notable for the following components:      Result Value   Platelets 110 (*)    All other components within normal limits  COMPREHENSIVE METABOLIC PANEL - Abnormal; Notable for the following components:   Glucose, Bld 105 (*)    Creatinine, Ser 0.43 (*)    Total Protein 8.9 (*)    AST 61 (*)    ALT 62 (*)    All other components within normal limits  URINALYSIS, ROUTINE W REFLEX MICROSCOPIC  PREGNANCY, URINE  LIPASE, BLOOD    EKG None  Radiology US Abdomen Limited  Result Date: 07/13/2018 CLINICAL DATA:  Right upper quadrant pain for 2 days EXAM: ULTRASOUND ABDOMEN LIMITED RIGHT UPPER QUADRANT COMPARISON:  Ultrasound 07/17/2009  FINDINGS: Gallbladder: Small amount of sludge in the gallbladder. Normal wall thickness. No shadowing stones. Negative sonographic Murphy. Common bile duct: Diameter: 1.3 mm Liver: Echogenic liver with heterogeneous echotexture and nodular contour. No focal hepatic abnormality. Portal vein is patent on color Doppler imaging with normal direction of blood flow towards the liver. IMPRESSION: 1. Small amount of sludge in the gallbladder. Negative for acute cholecystitis or biliary dilatation 2. Heterogeneous echogenic liver with nodular contour suspicious for cirrhosis Electronically Signed   By: Jasmine Pang M.D.   On: 07/13/2018 20:20    Procedures Procedures (including critical care time)  Medications Ordered in ED Medications - No data to display   Initial Impression / Assessment and Plan / ED Course  I have reviewed the triage vital signs and the nursing notes.  Pertinent labs & imaging results that were available during my care of the patient were reviewed by me and considered in my medical decision making (see chart for details).      Abdominal exam is benign.  No red flag signs or symptoms relating to her back pain.  Patient does have  mild elevation in her liver enzymes.  Abdominal ultrasound with sludge in the gallbladder but no evidence of cholecystitis.  Advised avoiding alcohol and Tylenol.  Instructed to follow-up with gastroenterology and return precautions given. Final Clinical Impressions(s) / ED Diagnoses   Final diagnoses:  Strain of lumbar region, initial encounter  Elevated liver enzymes    ED Discharge Orders         Ordered    ibuprofen (ADVIL,MOTRIN) 600 MG tablet  Every 6 hours PRN     07/13/18 2035    methocarbamol (ROBAXIN) 500 MG tablet  Every 8 hours PRN     07/13/18 2035           Loren Racer, MD 07/13/18 2036

## 2019-01-05 ENCOUNTER — Ambulatory Visit (INDEPENDENT_AMBULATORY_CARE_PROVIDER_SITE_OTHER): Payer: BLUE CROSS/BLUE SHIELD | Admitting: Podiatry

## 2019-01-05 DIAGNOSIS — B351 Tinea unguium: Secondary | ICD-10-CM

## 2019-01-05 DIAGNOSIS — L600 Ingrowing nail: Secondary | ICD-10-CM

## 2019-01-05 NOTE — Patient Instructions (Signed)

## 2019-01-09 ENCOUNTER — Encounter: Payer: Self-pay | Admitting: Podiatry

## 2019-01-11 ENCOUNTER — Ambulatory Visit (INDEPENDENT_AMBULATORY_CARE_PROVIDER_SITE_OTHER): Payer: Self-pay | Admitting: Podiatry

## 2019-01-11 DIAGNOSIS — B351 Tinea unguium: Secondary | ICD-10-CM

## 2019-01-11 DIAGNOSIS — L600 Ingrowing nail: Secondary | ICD-10-CM

## 2019-01-11 NOTE — Patient Instructions (Signed)

## 2019-01-12 NOTE — Progress Notes (Signed)
Subjective: 43 year old female presents the office today with concerns of right big toenail growing back.  Thickened discolored.  She will have the toenail removed.  The area is painful with pressure in shoes.  Denies any redness or drainage or any swelling to the toenail site. Denies any systemic complaints such as fevers, chills, nausea, vomiting. No acute changes since last appointment, and no other complaints at this time.   Objective: AAO x3, NAD DP/PT pulses palpable bilaterally, CRT less than 3 seconds The right hallux toenail is hypertrophic, dystrophic as well as yellow to brown to dark discoloration.  The nails significantly dystrophic.  There is subjective pain to the nail there is no redness or drainage or any clinical signs of infection. No open lesions or pre-ulcerative lesions.  No pain with calf compression, swelling, warmth, erythema  Assessment: Right hallux nail onychodystrophy  Plan: -All treatment options discussed with the patient including all alternatives, risks, complications.  -At this time, the patient is requesting total nail removal with chemical matricectomy to the symptomatic portion of the nail. Risks and complications were discussed with the patient for which they understand and written consent was obtained. Under sterile conditions a total of 3 mL of a mixture of 2% lidocaine plain and 0.5% Marcaine plain was infiltrated in a hallux block fashion. Once anesthetized, the skin was prepped in sterile fashion. A tourniquet was then applied. Next the right hallux nailmwas then sharply excised making sure to remove the entire offending nail border. Once the nails were ensured to be removed area was debrided and the underlying skin was intact. There is no purulence identified in the procedure. Next phenol was then applied under standard conditions and copiously irrigated. Silvadene was applied. A dry sterile dressing was applied. After application of the dressing the  tourniquet was removed and there is found to be an immediate capillary refill time to the digit. The patient tolerated the procedure well any complications. Post procedure instructions were discussed the patient for which he verbally understood. Follow-up in one week for nail check or sooner if any problems are to arise. Discussed signs/symptoms of infection and directed to call the office immediately should any occur or go directly to the emergency room. In the meantime, encouraged to call the office with any questions, concerns, changes symptoms. -Patient encouraged to call the office with any questions, concerns, change in symptoms.   Trula Slade DPM

## 2019-01-18 NOTE — Progress Notes (Signed)
Subjective: Angela Castillo is a 43 y.o.  female returns to office today for follow up evaluation after having right Hallux total nail avulsion performed. Patient has been soaking using epsom salts  and applying topical antibiotic covered with bandaid daily. She states that the soreness is improving and denies any drainage, pus.  Patient denies fevers, chills, nausea, vomiting. Denies any calf pain, chest pain, SOB.   Objective:  Vitals: Reviewed  General: Well developed, nourished, in no acute distress, alert and oriented x3   Dermatology: Skin is warm, dry and supple bilateral. RIGHT hallux nail bed appears to be clean, dry, with mild granular tissue and surrounding scab. There is no surrounding erythema, edema, drainage/purulence. The remaining nails appear unremarkable at this time. There are no other lesions or other signs of infection present.  Neurovascular status: Intact. No lower extremity swelling; No pain with calf compression bilateral.  Musculoskeletal: No tenderness to palpation of the right hallux nail bed. Muscular strength within normal bilateral.   Assesement and Plan: S/p partial nail avulsion, doing well.   -Continue soaking in epsom salts twice a day followed by antibiotic ointment and a band-aid. Can leave uncovered at night. Continue this until completely healed.  -If the area has not healed in 2 weeks, call the office for follow-up appointment, or sooner if any problems arise.  -Monitor for any signs/symptoms of infection. Call the office immediately if any occur or go directly to the emergency room. Call with any questions/concerns.  Ovid Curd, DPM

## 2019-01-25 ENCOUNTER — Other Ambulatory Visit: Payer: BLUE CROSS/BLUE SHIELD

## 2019-03-05 DIAGNOSIS — Z8719 Personal history of other diseases of the digestive system: Secondary | ICD-10-CM

## 2019-03-05 DIAGNOSIS — Z9289 Personal history of other medical treatment: Secondary | ICD-10-CM

## 2019-03-05 DIAGNOSIS — J189 Pneumonia, unspecified organism: Secondary | ICD-10-CM

## 2019-03-05 HISTORY — DX: Personal history of other diseases of the digestive system: Z87.19

## 2019-03-05 HISTORY — DX: Personal history of other medical treatment: Z92.89

## 2019-03-05 HISTORY — DX: Pneumonia, unspecified organism: J18.9

## 2019-03-21 ENCOUNTER — Other Ambulatory Visit: Payer: Self-pay

## 2019-03-21 ENCOUNTER — Inpatient Hospital Stay (HOSPITAL_COMMUNITY)
Admission: EM | Admit: 2019-03-21 | Discharge: 2019-03-29 | DRG: 441 | Disposition: A | Discharge status: Home / Self Care | Payer: BC Managed Care – PPO | Attending: Internal Medicine | Admitting: Internal Medicine

## 2019-03-21 ENCOUNTER — Emergency Department (HOSPITAL_COMMUNITY): Payer: BC Managed Care – PPO

## 2019-03-21 ENCOUNTER — Encounter (HOSPITAL_COMMUNITY): Payer: Self-pay

## 2019-03-21 DIAGNOSIS — M25519 Pain in unspecified shoulder: Secondary | ICD-10-CM | POA: Diagnosis present

## 2019-03-21 DIAGNOSIS — D509 Iron deficiency anemia, unspecified: Secondary | ICD-10-CM | POA: Diagnosis present

## 2019-03-21 DIAGNOSIS — G8929 Other chronic pain: Secondary | ICD-10-CM | POA: Diagnosis present

## 2019-03-21 DIAGNOSIS — I864 Gastric varices: Secondary | ICD-10-CM | POA: Diagnosis not present

## 2019-03-21 DIAGNOSIS — Z6841 Body Mass Index (BMI) 40.0 and over, adult: Secondary | ICD-10-CM

## 2019-03-21 DIAGNOSIS — E876 Hypokalemia: Secondary | ICD-10-CM | POA: Diagnosis not present

## 2019-03-21 DIAGNOSIS — R000 Tachycardia, unspecified: Secondary | ICD-10-CM | POA: Diagnosis not present

## 2019-03-21 DIAGNOSIS — B9620 Unspecified Escherichia coli [E. coli] as the cause of diseases classified elsewhere: Secondary | ICD-10-CM | POA: Diagnosis not present

## 2019-03-21 DIAGNOSIS — Z800 Family history of malignant neoplasm of digestive organs: Secondary | ICD-10-CM

## 2019-03-21 DIAGNOSIS — E1165 Type 2 diabetes mellitus with hyperglycemia: Secondary | ICD-10-CM | POA: Diagnosis present

## 2019-03-21 DIAGNOSIS — D6959 Other secondary thrombocytopenia: Secondary | ICD-10-CM | POA: Diagnosis not present

## 2019-03-21 DIAGNOSIS — J9589 Other postprocedural complications and disorders of respiratory system, not elsewhere classified: Secondary | ICD-10-CM | POA: Diagnosis not present

## 2019-03-21 DIAGNOSIS — Z4682 Encounter for fitting and adjustment of non-vascular catheter: Secondary | ICD-10-CM | POA: Diagnosis not present

## 2019-03-21 DIAGNOSIS — I10 Essential (primary) hypertension: Secondary | ICD-10-CM | POA: Diagnosis not present

## 2019-03-21 DIAGNOSIS — Z452 Encounter for adjustment and management of vascular access device: Secondary | ICD-10-CM

## 2019-03-21 DIAGNOSIS — E1169 Type 2 diabetes mellitus with other specified complication: Secondary | ICD-10-CM

## 2019-03-21 DIAGNOSIS — J690 Pneumonitis due to inhalation of food and vomit: Secondary | ICD-10-CM | POA: Diagnosis not present

## 2019-03-21 DIAGNOSIS — K922 Gastrointestinal hemorrhage, unspecified: Secondary | ICD-10-CM | POA: Diagnosis not present

## 2019-03-21 DIAGNOSIS — R0602 Shortness of breath: Secondary | ICD-10-CM | POA: Diagnosis not present

## 2019-03-21 DIAGNOSIS — R0689 Other abnormalities of breathing: Secondary | ICD-10-CM | POA: Diagnosis not present

## 2019-03-21 DIAGNOSIS — R579 Shock, unspecified: Secondary | ICD-10-CM

## 2019-03-21 DIAGNOSIS — I8511 Secondary esophageal varices with bleeding: Secondary | ICD-10-CM | POA: Diagnosis present

## 2019-03-21 DIAGNOSIS — M549 Dorsalgia, unspecified: Secondary | ICD-10-CM | POA: Diagnosis present

## 2019-03-21 DIAGNOSIS — E669 Obesity, unspecified: Secondary | ICD-10-CM | POA: Diagnosis not present

## 2019-03-21 DIAGNOSIS — D62 Acute posthemorrhagic anemia: Secondary | ICD-10-CM | POA: Diagnosis not present

## 2019-03-21 DIAGNOSIS — K920 Hematemesis: Secondary | ICD-10-CM

## 2019-03-21 DIAGNOSIS — D508 Other iron deficiency anemias: Secondary | ICD-10-CM

## 2019-03-21 DIAGNOSIS — J9600 Acute respiratory failure, unspecified whether with hypoxia or hypercapnia: Secondary | ICD-10-CM | POA: Diagnosis not present

## 2019-03-21 DIAGNOSIS — Z20828 Contact with and (suspected) exposure to other viral communicable diseases: Secondary | ICD-10-CM | POA: Diagnosis not present

## 2019-03-21 DIAGNOSIS — Z79899 Other long term (current) drug therapy: Secondary | ICD-10-CM

## 2019-03-21 DIAGNOSIS — R578 Other shock: Secondary | ICD-10-CM | POA: Diagnosis not present

## 2019-03-21 DIAGNOSIS — N390 Urinary tract infection, site not specified: Secondary | ICD-10-CM | POA: Diagnosis not present

## 2019-03-21 DIAGNOSIS — I9581 Postprocedural hypotension: Secondary | ICD-10-CM | POA: Diagnosis not present

## 2019-03-21 DIAGNOSIS — J9690 Respiratory failure, unspecified, unspecified whether with hypoxia or hypercapnia: Secondary | ICD-10-CM

## 2019-03-21 DIAGNOSIS — K2971 Gastritis, unspecified, with bleeding: Secondary | ICD-10-CM | POA: Diagnosis not present

## 2019-03-21 DIAGNOSIS — K7460 Unspecified cirrhosis of liver: Secondary | ICD-10-CM | POA: Diagnosis not present

## 2019-03-21 DIAGNOSIS — K7581 Nonalcoholic steatohepatitis (NASH): Secondary | ICD-10-CM | POA: Diagnosis not present

## 2019-03-21 DIAGNOSIS — K228 Other specified diseases of esophagus: Secondary | ICD-10-CM | POA: Diagnosis present

## 2019-03-21 DIAGNOSIS — D696 Thrombocytopenia, unspecified: Secondary | ICD-10-CM | POA: Diagnosis not present

## 2019-03-21 DIAGNOSIS — Z8719 Personal history of other diseases of the digestive system: Secondary | ICD-10-CM | POA: Diagnosis present

## 2019-03-21 DIAGNOSIS — Z01818 Encounter for other preprocedural examination: Secondary | ICD-10-CM

## 2019-03-21 DIAGNOSIS — K219 Gastro-esophageal reflux disease without esophagitis: Secondary | ICD-10-CM | POA: Diagnosis not present

## 2019-03-21 DIAGNOSIS — I8501 Esophageal varices with bleeding: Secondary | ICD-10-CM | POA: Diagnosis not present

## 2019-03-21 LAB — HEMOGLOBIN A1C
Hgb A1c MFr Bld: 9.5 % — ABNORMAL HIGH (ref 4.8–5.6)
Mean Plasma Glucose: 225.95 mg/dL

## 2019-03-21 LAB — COMPREHENSIVE METABOLIC PANEL
ALT: 50 U/L — ABNORMAL HIGH (ref 0–44)
AST: 49 U/L — ABNORMAL HIGH (ref 15–41)
Albumin: 3.3 g/dL — ABNORMAL LOW (ref 3.5–5.0)
Alkaline Phosphatase: 133 U/L — ABNORMAL HIGH (ref 38–126)
Anion gap: 7 (ref 5–15)
BUN: 13 mg/dL (ref 6–20)
CO2: 24 mmol/L (ref 22–32)
Calcium: 8.6 mg/dL — ABNORMAL LOW (ref 8.9–10.3)
Chloride: 106 mmol/L (ref 98–111)
Creatinine, Ser: 0.54 mg/dL (ref 0.44–1.00)
GFR calc Af Amer: >60 mL/min (ref >60)
GFR calc non Af Amer: >60 mL/min (ref >60)
Glucose, Bld: 324 mg/dL — ABNORMAL HIGH (ref 70–99)
Potassium: 4.2 mmol/L (ref 3.5–5.1)
Sodium: 137 mmol/L (ref 135–145)
Total Bilirubin: 0.7 mg/dL (ref 0.3–1.2)
Total Protein: 7.3 g/dL (ref 6.5–8.1)

## 2019-03-21 LAB — PHOSPHORUS: Phosphorus: 2.8 mg/dL (ref 2.5–4.6)

## 2019-03-21 LAB — MAGNESIUM: Magnesium: 1.7 mg/dL (ref 1.7–2.4)

## 2019-03-21 LAB — URINALYSIS, ROUTINE W REFLEX MICROSCOPIC
Bilirubin Urine: NEGATIVE
Glucose, UA: >=500 mg/dL — AB
Hgb urine dipstick: NEGATIVE
Ketones, ur: 5 mg/dL — AB
Leukocytes,Ua: NEGATIVE
Nitrite: NEGATIVE
Protein, ur: NEGATIVE mg/dL
Specific Gravity, Urine: 1.022 (ref 1.005–1.030)
pH: 8 (ref 5.0–8.0)

## 2019-03-21 LAB — CBC
HCT: 34.4 % — ABNORMAL LOW (ref 36.0–46.0)
Hemoglobin: 10.3 g/dL — ABNORMAL LOW (ref 12.0–15.0)
MCH: 26.3 pg (ref 26.0–34.0)
MCHC: 29.9 g/dL — ABNORMAL LOW (ref 30.0–36.0)
MCV: 87.8 fL (ref 80.0–100.0)
Platelets: 94 10*3/uL — ABNORMAL LOW (ref 150–400)
RBC: 3.92 MIL/uL (ref 3.87–5.11)
RDW: 14.7 % (ref 11.5–15.5)
WBC: 6.9 10*3/uL (ref 4.0–10.5)
nRBC: 0 % (ref 0.0–0.2)

## 2019-03-21 LAB — CALCIUM: Calcium: 8 mg/dL — ABNORMAL LOW (ref 8.9–10.3)

## 2019-03-21 LAB — SARS CORONAVIRUS 2 BY RT PCR (HOSPITAL ORDER, PERFORMED IN ~~LOC~~ HOSPITAL LAB): SARS Coronavirus 2: NEGATIVE

## 2019-03-21 LAB — BRAIN NATRIURETIC PEPTIDE: B Natriuretic Peptide: 29.1 pg/mL (ref 0.0–100.0)

## 2019-03-21 LAB — PROTIME-INR
INR: 1.3 — ABNORMAL HIGH (ref 0.8–1.2)
Prothrombin Time: 16.1 seconds — ABNORMAL HIGH (ref 11.4–15.2)

## 2019-03-21 LAB — TSH: TSH: 0.806 u[IU]/mL (ref 0.350–4.500)

## 2019-03-21 LAB — I-STAT BETA HCG BLOOD, ED (MC, WL, AP ONLY): I-stat hCG, quantitative: <5 m[IU]/mL (ref <5)

## 2019-03-21 LAB — LIPASE, BLOOD: Lipase: 35 U/L (ref 11–51)

## 2019-03-21 IMAGING — DX PORTABLE CHEST - 1 VIEW
1 series · 1 of 1 positions shown · non-contrast
Comparison: [DATE]

CLINICAL DATA: Large volume hematemesis

EXAM:
PORTABLE CHEST 1 VIEW

[chest ap]
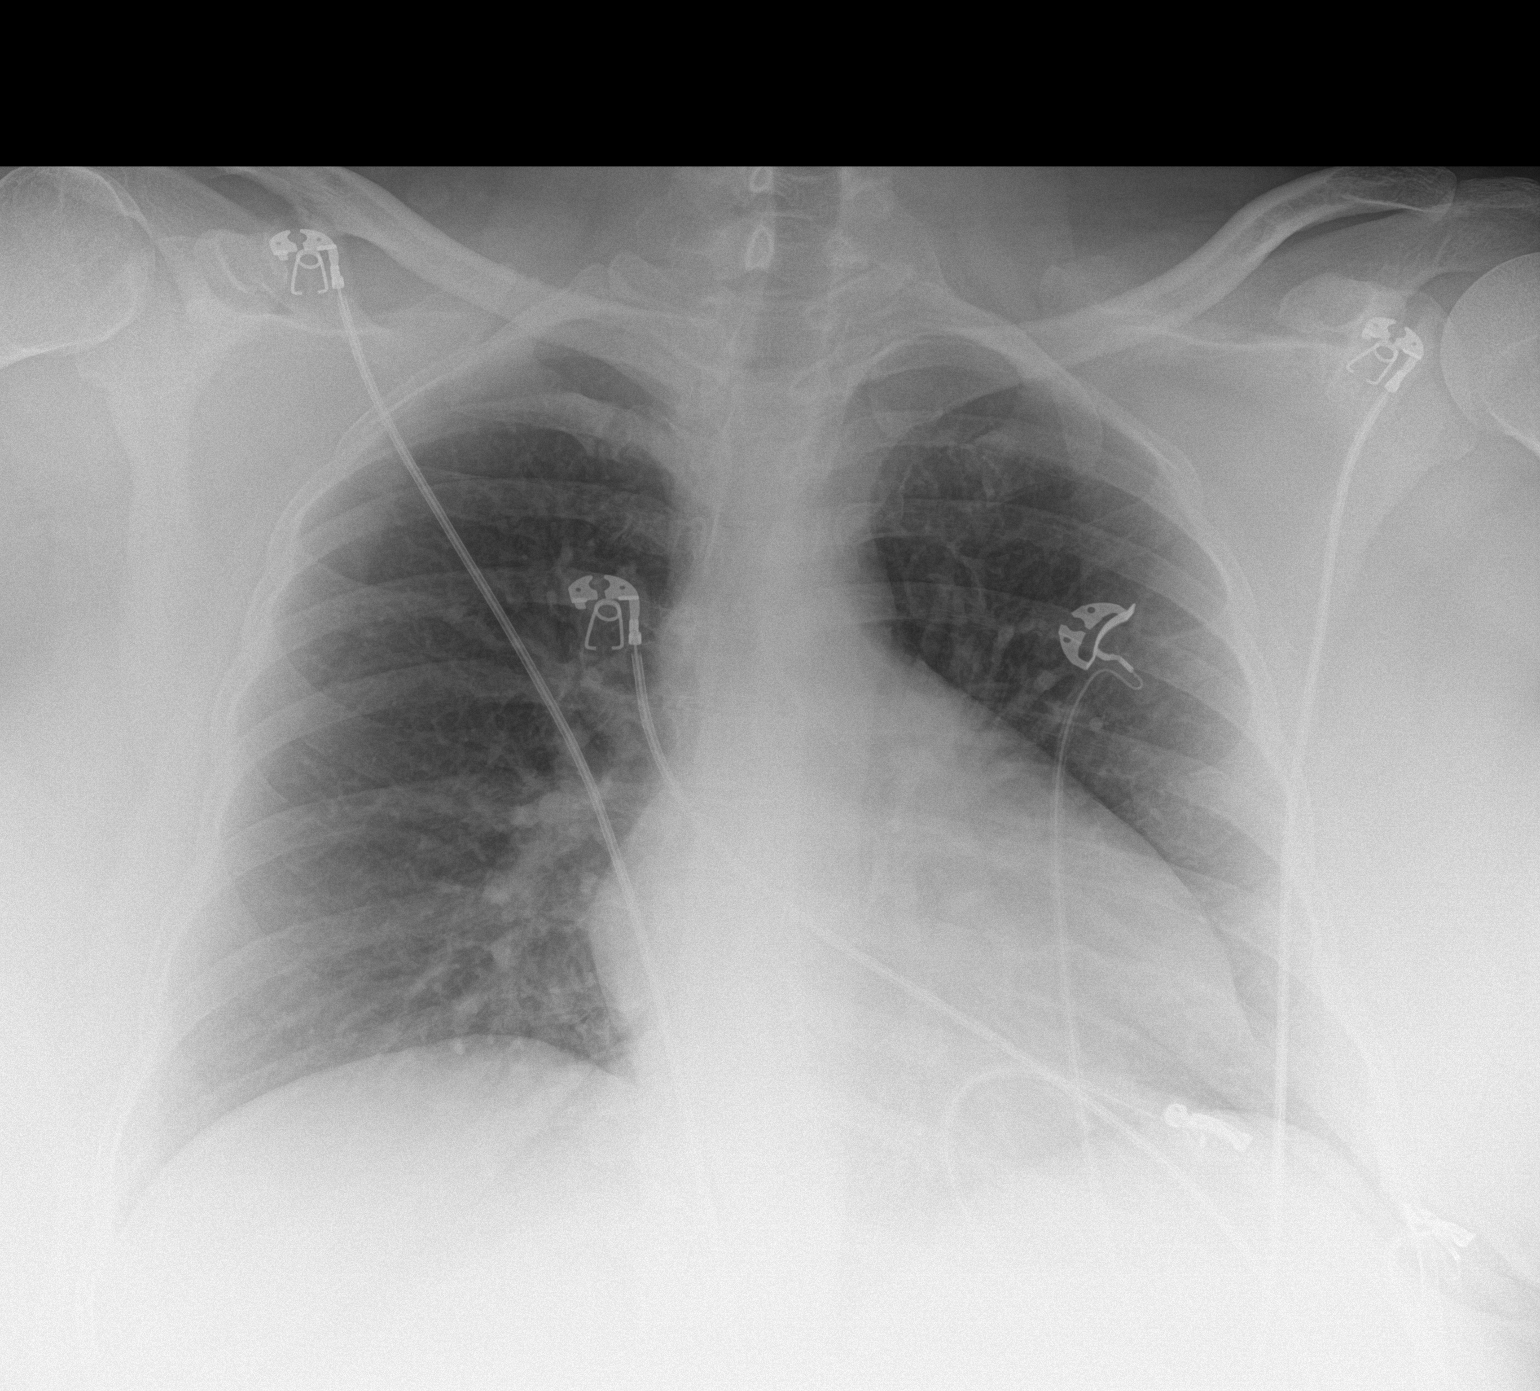

[1 of 1 positions shown; findings below may reference images not displayed]

FINDINGS: Lungs are clear.  No pleural effusion or pneumothorax.

The heart is normal in size.  No evidence of pneumomediastinum.
IMPRESSION: No evidence of acute cardiopulmonary disease.

No evidence of pneumomediastinum.

## 2019-03-21 MED ORDER — ALBUTEROL SULFATE (2.5 MG/3ML) 0.083% IN NEBU: 2.5000 mg | INHALATION_SOLUTION | RESPIRATORY_TRACT | Status: DC | PRN

## 2019-03-21 MED ORDER — SODIUM CHLORIDE 0.9% FLUSH
3.0000 mL | Freq: Two times a day (BID) | INTRAVENOUS | Status: DC
  Administered 2019-03-21 – 2019-03-29 (×14): 3 mL via INTRAVENOUS

## 2019-03-21 MED ORDER — FERROUS SULFATE 325 (65 FE) MG PO TABS
325.0000 mg | ORAL_TABLET | Freq: Two times a day (BID) | ORAL | Status: DC
  Administered 2019-03-21: 325 mg via ORAL
  Filled 2019-03-21: qty 1

## 2019-03-21 MED ORDER — ONDANSETRON HCL 4 MG PO TABS: 4.0000 mg | ORAL_TABLET | Freq: Four times a day (QID) | ORAL | Status: DC | PRN

## 2019-03-21 MED ORDER — HYDROCODONE-ACETAMINOPHEN 5-325 MG PO TABS: 1.0000 | ORAL_TABLET | ORAL | Status: DC | PRN

## 2019-03-21 MED ORDER — PANTOPRAZOLE SODIUM 40 MG IV SOLR
40.0000 mg | Freq: Once | INTRAVENOUS | Status: AC
  Administered 2019-03-21: 08:00:00 40 mg via INTRAVENOUS
  Filled 2019-03-21: qty 40

## 2019-03-21 MED ORDER — SODIUM CHLORIDE 0.9 % IV BOLUS
1000.0000 mL | Freq: Once | INTRAVENOUS | Status: AC
  Administered 2019-03-21: 1000 mL via INTRAVENOUS

## 2019-03-21 MED ORDER — HYDROMORPHONE HCL 1 MG/ML IJ SOLN: 1.0000 mg | INTRAMUSCULAR | Status: DC | PRN

## 2019-03-21 MED ORDER — ONDANSETRON HCL 4 MG/2ML IJ SOLN
4.0000 mg | Freq: Four times a day (QID) | INTRAMUSCULAR | Status: DC | PRN
  Administered 2019-03-24 – 2019-03-25 (×3): 4 mg via INTRAVENOUS
  Filled 2019-03-21 (×3): qty 2

## 2019-03-21 MED ORDER — ONDANSETRON HCL 4 MG/2ML IJ SOLN
4.0000 mg | Freq: Once | INTRAMUSCULAR | Status: AC
  Administered 2019-03-21: 4 mg via INTRAVENOUS
  Filled 2019-03-21: qty 2

## 2019-03-21 MED ORDER — ACETAMINOPHEN 325 MG PO TABS
650.0000 mg | ORAL_TABLET | Freq: Four times a day (QID) | ORAL | Status: DC | PRN
  Administered 2019-03-21 (×2): 650 mg via ORAL
  Filled 2019-03-21 (×2): qty 2

## 2019-03-21 MED ORDER — SODIUM CHLORIDE 0.9 % IV SOLN
8.0000 mg/h | INTRAVENOUS | Status: AC
  Administered 2019-03-21 – 2019-03-23 (×5): 8 mg/h via INTRAVENOUS
  Filled 2019-03-21 (×10): qty 80

## 2019-03-21 MED ORDER — TRAZODONE HCL 50 MG PO TABS: 50.0000 mg | ORAL_TABLET | Freq: Every evening | ORAL | Status: DC | PRN

## 2019-03-21 MED ORDER — ACETAMINOPHEN 650 MG RE SUPP
650.0000 mg | Freq: Four times a day (QID) | RECTAL | Status: DC | PRN
  Administered 2019-03-24: 650 mg via RECTAL
  Filled 2019-03-21: qty 1

## 2019-03-21 MED ORDER — MAGNESIUM CITRATE PO SOLN: 1.0000 | Freq: Once | ORAL | Status: DC | PRN

## 2019-03-21 MED ORDER — SENNOSIDES-DOCUSATE SODIUM 8.6-50 MG PO TABS: 1.0000 | ORAL_TABLET | Freq: Every evening | ORAL | Status: DC | PRN

## 2019-03-21 MED ORDER — DOCUSATE SODIUM 100 MG PO CAPS
100.0000 mg | ORAL_CAPSULE | Freq: Two times a day (BID) | ORAL | Status: DC
  Filled 2019-03-21: qty 1

## 2019-03-21 MED ORDER — SODIUM CHLORIDE 0.9 % IV SOLN
INTRAVENOUS | Status: DC
  Administered 2019-03-21: 12:00:00 via INTRAVENOUS

## 2019-03-21 MED ORDER — SODIUM CHLORIDE 0.9 % IV SOLN
INTRAVENOUS | Status: DC
  Administered 2019-03-21: 22:00:00 via INTRAVENOUS

## 2019-03-21 MED ORDER — SORBITOL 70 % SOLN
30.0000 mL | Freq: Every day | Status: DC | PRN
  Filled 2019-03-21: qty 30

## 2019-03-21 NOTE — Consult Note (Addendum)
Referring Provider: Dr. Roger Shelter Primary Care Physician:  Patient, No Pcp Per Primary Gastroenterologist:  Althia Forts  Reason for Consultation:  Hematemesis  HPI: Angela Castillo is a 43 y.o. female with acute onset of hematemesis early this morning. Reports that she belched and felt nauseous early this morning and then shortly after had profuse bright red blood vomiting X 3 that filled her trash can. She brought in about 500 cc of bright red blood in a bag to the ER. Had an episode of bright red blood per rectum in the ER and again upon admit to her room but denies any vomiting since coming to the hospital. Denies any black stools. Denied any associated abdominal pain to me but ER note reports that she had some mild epigastric pain for the last week that occurred after eating and improved with water. Occasional GERD but denies any recent issues and does not take any reflux meds on a regular basis. Denies dysphagia. Has taken Ibuprofen about 3-4 times in the last 2 weeks and denies other NSAIDs. Rare alcohol. No history of ulcers or previous GI bleeding. No previous colonoscopy or EGD. Hgb 10.3 on admit. COVID-19 test negative.  Past Medical History:  Diagnosis Date  . Anemia Dx January 03 2015  . Elevated liver enzymes   . Hypertension Dx Apr 2016  . Reflux     Past Surgical History:  Procedure Laterality Date  . TUBAL LIGATION  Jul 25, 2000    Prior to Admission medications   Medication Sig Start Date End Date Taking? Authorizing Provider  ibuprofen (ADVIL) 200 MG tablet Take 800 mg by mouth every 6 (six) hours as needed for mild pain.   Yes [provider]  benzonatate (TESSALON) 200 MG capsule Take 1 capsule (200 mg total) by mouth 3 (three) times daily as needed for cough. Patient not taking: Reported on 03/21/2019 04/27/18   Street, [place], PA-C  ferrous sulfate 325 (65 FE) MG tablet Take 1 tablet (325 mg total) by mouth 2 (two) times daily with a meal. Patient not  taking: Reported on 03/21/2019 05/01/17   Boykin Nearing, MD  ibuprofen (ADVIL,MOTRIN) 600 MG tablet Take 1 tablet (600 mg total) by mouth every 6 (six) hours as needed for moderate pain. Patient not taking: Reported on 03/21/2019 07/13/18   Julianne Rice, MD  losartan (COZAAR) 100 MG tablet TAKE 1 TABLET BY MOUTH ONCE DAILY Patient not taking: Reported on 03/21/2019 09/15/17   Tresa Garter, MD  losartan (COZAAR) 100 MG tablet Take 1 tablet (100 mg total) by mouth daily. Patient not taking: Reported on 03/21/2019 04/27/18   Street, [place], PA-C  meloxicam (MOBIC) 15 MG tablet Take 1 tablet (15 mg total) by mouth daily. Patient not taking: Reported on 03/21/2019 05/01/17   Boykin Nearing, MD  methocarbamol (ROBAXIN) 500 MG tablet Take 1 tablet (500 mg total) by mouth every 8 (eight) hours as needed for muscle spasms. Patient not taking: Reported on 03/21/2019 07/13/18   Julianne Rice, MD  traMADol (ULTRAM) 50 MG tablet Take 1 tablet (50 mg total) by mouth every 8 (eight) hours as needed. Patient not taking: Reported on 03/21/2019 05/01/17   Boykin Nearing, MD    Scheduled Meds: . docusate sodium  100 mg Oral BID  . ferrous sulfate  325 mg Oral BID WC  . sodium chloride flush  3 mL Intravenous Q12H   Continuous Infusions: . sodium chloride 100 mL/hr at 03/21/19 1205  . pantoprozole (PROTONIX) infusion 8 mg/hr (03/21/19 1053)  PRN Meds:.acetaminophen **OR** acetaminophen, albuterol, HYDROcodone-acetaminophen, HYDROmorphone (DILAUDID) injection, magnesium citrate, ondansetron **OR** ondansetron (ZOFRAN) IV, senna-docusate, sorbitol, traZODone  Allergies as of 03/21/2019  . (No Known Allergies)    Family History  Problem Relation Age of Onset  . COPD Mother   . Esophageal cancer Mother   . Diabetes Mother   . Cancer Mother   . Hypertension Mother   . Diabetes Father     Social History   Socioeconomic History  . Marital status: Married    Spouse name: Not on file  .  Number of children: Not on file  . Years of education: Not on file  . Highest education level: Not on file  Occupational History  . Not on file  Social Needs  . Financial resource strain: Not on file  . Food insecurity:    Worry: Not on file    Inability: Not on file  . Transportation needs:    Medical: Not on file    Non-medical: Not on file  Tobacco Use  . Smoking status: Never Smoker  . Smokeless tobacco: Never Used  Substance and Sexual Activity  . Alcohol use: Yes    Comment: rarely  . Drug use: No  . Sexual activity: Not on file  Lifestyle  . Physical activity:    Days per week: Not on file    Minutes per session: Not on file  . Stress: Not on file  Relationships  . Social connections:    Talks on phone: Not on file    Gets together: Not on file    Attends religious service: Not on file    Active member of club or organization: Not on file    Attends meetings of clubs or organizations: Not on file    Relationship status: Not on file  . Intimate partner violence:    Fear of current or ex partner: Not on file    Emotionally abused: Not on file    Physically abused: Not on file    Forced sexual activity: Not on file  Other Topics Concern  . Not on file  Social History Narrative  . Not on file    Review of Systems: All negative except as stated above in HPI.  Physical Exam: Vital signs: Vitals:   03/21/19 1152 03/21/19 1313  BP: (!) 141/90 134/81  Pulse: (!) 103 98  Resp: 14 16  Temp: 99.4 F (37.4 C) 99.1 F (37.3 C)  SpO2: 97% 97%   Last BM Date: 03/21/19 General:   Lethargic, obese, pleasant, no acute distress Head: normocephalic, atraumatic Eyes: anicteric sclera ENT: oropharynx clear Neck: supple, nontender Lungs:  Clear throughout to auscultation.   No wheezes, crackles, or rhonchi. No acute distress. Heart:  Regular rate and rhythm; no murmurs, clicks, rubs,  or gallops. Abdomen: soft, nontender, nondistended, +BS, obese Rectal:   Deferred Ext: no edema  GI:  Lab Results: Recent Labs    03/21/19 0757  WBC 6.9  HGB 10.3*  HCT 34.4*  PLT 94*   BMET Recent Labs    03/21/19 0757 03/21/19 1257  NA 137  --   K 4.2  --   CL 106  --   CO2 24  --   GLUCOSE 324*  --   BUN 13  --   CREATININE 0.54  --   CALCIUM 8.6* 8.0*   LFT Recent Labs    03/21/19 0757  PROT 7.3  ALBUMIN 3.3*  AST 49*  ALT 50*  ALKPHOS 133*  BILITOT 0.7   PT/INR Recent Labs    03/21/19 1257  LABPROT 16.1*  INR 1.3*     Studies/Results: Dg Chest Portable 1 View  Result Date: 03/21/2019 CLINICAL DATA:  Large volume hematemesis EXAM: PORTABLE CHEST 1 VIEW COMPARISON:  08/16/2015 FINDINGS: Lungs are clear.  No pleural effusion or pneumothorax. The heart is normal in size.  No evidence of pneumomediastinum. IMPRESSION: No evidence of acute cardiopulmonary disease. No evidence of pneumomediastinum. Electronically Signed   By: Julian Hy M.D.   On: 03/21/2019 10:37    Impression/Plan: GI bleed with hematemesis and hematochezia - hemodynamically stable. Question Mallory Weiss tear vs peptic ulcer disease. Protonix drip. Antiemetics prn. Clear liquid diet. IVFs. NPO p MN for EGD tomorrow to further evaluate. If EGD ok, then can be discharged tomorrow afternoon if she is stable.    LOS: 0 days   [name]  03/21/2019, 3:14 PM  Questions please call [phone]

## 2019-03-21 NOTE — ED Provider Notes (Addendum)
[facility]-EMERGENCY DEPT Provider Note   CSN: [id number] Arrival date & time: 03/21/19  4497    History   Chief Complaint Chief Complaint  Patient presents with  . Hematemesis    HPI Angela Castillo is a 43 y.o. female with history of anemia, acid reflux is here for evaluation of hematemesis x3.  Onset around 6 AM today.  There is approximately 500 cc bright red blood in emesis bag that she brought to the ED.  Reports waking up at 5 AM today and feeling some nausea, she had 2 normal BMs and subsequently became more nauseated and vomited 3 times.  All 3 episodes were bloody.  Since she has noticed mild "discomfort" to the epigastric region that feels like needles pushing out, occasionally radiating to the umbilicus.  Admits that for the last week she has had some discomfort in this area that usually worsens after large meals and gets better with drinking water.  In the past she has had acid reflux and has taken OTC antiacid medicines but lately this has been under good control.  She takes ibuprofen for occasional chronic back pain/headaches, in the last 2 weeks she is taking it 3-4 times.  Last night she took 4 ibuprofen.  She had seafood for dinner last night.  Rare EtOH use.  No tobacco use.  She has never had gastritis, PUD, GI bleed in the past.  No previous EGDs or colonoscopies.  Denies fevers, chest pain, shortness of breath, back pain, cough, hemoptysis, hematuria or obvious blood in her stools lately.  No interventions.  No alleviating factors.  History of BTL, otherwise no abdominal surgeries. HPI  Past Medical History:  Diagnosis Date  . Anemia Dx January 03 2015  . Elevated liver enzymes   . Hypertension Dx Apr 2016  . Reflux     Patient Active Problem List   Diagnosis Date Noted  . GI bleed 03/21/2019  . Impingement syndrome of left shoulder 04/21/2017  . Low back pain 03/18/2016  . Left shoulder pain 03/18/2016  . Esophageal reflux 03/18/2016   . Lateral pain of left hip 10/25/2015  . Heel pain, bilateral 05/03/2015  . Obesity (BMI 30-39.9) 05/03/2015  . Hearing loss in right ear 03/01/2015  . Family history of diabetes mellitus in mother 03/01/2015  . Essential hypertension 01/18/2015  . Anemia, iron deficiency 01/04/2015  . Musculoskeletal chest pain 01/04/2015  . Transaminitis 01/03/2015    Past Surgical History:  Procedure Laterality Date  . TUBAL LIGATION  Jul 25, 2000     OB History   No obstetric history on file.      Home Medications    Prior to Admission medications   Medication Sig Start Date End Date Taking? Authorizing Provider  ibuprofen (ADVIL) 200 MG tablet Take 800 mg by mouth every 6 (six) hours as needed for mild pain.   Yes [provider]  benzonatate (TESSALON) 200 MG capsule Take 1 capsule (200 mg total) by mouth 3 (three) times daily as needed for cough. Patient not taking: Reported on 03/21/2019 04/27/18   Street, [place], PA-C  ferrous sulfate 325 (65 FE) MG tablet Take 1 tablet (325 mg total) by mouth 2 (two) times daily with a meal. Patient not taking: Reported on 03/21/2019 05/01/17   Dessa Phi, MD  ibuprofen (ADVIL,MOTRIN) 600 MG tablet Take 1 tablet (600 mg total) by mouth every 6 (six) hours as needed for moderate pain. Patient not taking: Reported on 03/21/2019 07/13/18  Loren Racer, MD  losartan (COZAAR) 100 MG tablet TAKE 1 TABLET BY MOUTH ONCE DAILY Patient not taking: Reported on 03/21/2019 09/15/17   Quentin Angst, MD  losartan (COZAAR) 100 MG tablet Take 1 tablet (100 mg total) by mouth daily. Patient not taking: Reported on 03/21/2019 04/27/18   Street, [place], PA-C  meloxicam (MOBIC) 15 MG tablet Take 1 tablet (15 mg total) by mouth daily. Patient not taking: Reported on 03/21/2019 05/01/17   Dessa Phi, MD  methocarbamol (ROBAXIN) 500 MG tablet Take 1 tablet (500 mg total) by mouth every 8 (eight) hours as needed for muscle spasms. Patient not  taking: Reported on 03/21/2019 07/13/18   Loren Racer, MD  traMADol (ULTRAM) 50 MG tablet Take 1 tablet (50 mg total) by mouth every 8 (eight) hours as needed. Patient not taking: Reported on 03/21/2019 05/01/17   Dessa Phi, MD    Family History Family History  Problem Relation Age of Onset  . COPD Mother   . Esophageal cancer Mother   . Diabetes Mother   . Cancer Mother   . Hypertension Mother   . Diabetes Father     Social History Social History   Tobacco Use  . Smoking status: Never Smoker  . Smokeless tobacco: Never Used  Substance Use Topics  . Alcohol use: Yes    Comment: rarely  . Drug use: No     Allergies   Patient has no known allergies.   Review of Systems Review of Systems  Gastrointestinal: Positive for abdominal pain, nausea and vomiting (bloody).  All other systems reviewed and are negative.    Physical Exam Updated Vital Signs BP 130/84   Pulse 95   Temp 98.5 F (36.9 C) (Oral)   Resp 19   Ht  (1.6 m)   Wt 111.6 kg   LMP 02/16/2019   SpO2 97%   BMI 43.58 kg/m   Physical Exam Vitals signs and nursing note reviewed.  Constitutional:      Appearance: She is well-developed.     Comments: Non toxic in NAD  HENT:     Head: Normocephalic and atraumatic.     Nose: Nose normal.  Eyes:     Conjunctiva/sclera: Conjunctivae normal.  Neck:     Musculoskeletal: Normal range of motion.     Comments: Trachea is midline. Cardiovascular:     Rate and Rhythm: Normal rate and regular rhythm.     Comments: Tachycardic HR 100-110 during exam.  1+ radial and DP pulses bilaterally.  BP is normal.  No crepitus around the chest wall, neck. Pulmonary:     Effort: Pulmonary effort is normal.     Breath sounds: Normal breath sounds.  Abdominal:     General: Bowel sounds are normal.     Palpations: Abdomen is soft.     Tenderness: There is abdominal tenderness.     Comments: Very mild epigastric tenderness.  No G/R/R. No suprapubic or CVA  tenderness. Negative Murphy's and McBurney's. Obese abdomen.  Musculoskeletal: Normal range of motion.  Skin:    General: Skin is warm and dry.     Capillary Refill: Capillary refill takes less than 2 seconds.  Neurological:     Mental Status: She is alert.  Psychiatric:        Behavior: Behavior normal.      ED Treatments / Results  Labs (all labs ordered are listed, but only abnormal results are displayed) Labs Reviewed  COMPREHENSIVE METABOLIC PANEL - Abnormal; Notable for the  following components:      Result Value   Glucose, Bld 324 (*)    Calcium 8.6 (*)    Albumin 3.3 (*)    AST 49 (*)    ALT 50 (*)    Alkaline Phosphatase 133 (*)    All other components within normal limits  CBC - Abnormal; Notable for the following components:   Hemoglobin 10.3 (*)    HCT 34.4 (*)    MCHC 29.9 (*)    Platelets 94 (*)    All other components within normal limits  URINALYSIS, ROUTINE W REFLEX MICROSCOPIC - Abnormal; Notable for the following components:   Glucose, UA >=500 (*)    Ketones, ur 5 (*)    Bacteria, UA RARE (*)    All other components within normal limits  SARS CORONAVIRUS 2 (HOSPITAL ORDER, PERFORMED IN [facility])  LIPASE, BLOOD  HIV ANTIBODY (ROUTINE TESTING W REFLEX)  CALCIUM  MAGNESIUM  PHOSPHORUS  BRAIN NATRIURETIC PEPTIDE  TSH  HEMOGLOBIN A1C  PROTIME-INR  I-STAT BETA HCG BLOOD, ED ([facility], [facility], [facility] ONLY)  POC OCCULT BLOOD, ED  TYPE AND SCREEN    EKG None  Radiology Dg Chest Portable 1 View  Result Date: 03/21/2019 CLINICAL DATA:  Large volume hematemesis EXAM: PORTABLE CHEST 1 VIEW COMPARISON:  08/16/2015 FINDINGS: Lungs are clear.  No pleural effusion or pneumothorax. The heart is normal in size.  No evidence of pneumomediastinum. IMPRESSION: No evidence of acute cardiopulmonary disease. No evidence of pneumomediastinum. Electronically Signed   By: Charline Bills M.D.   On: 03/21/2019 10:37    Procedures .Critical Care Performed by:  Liberty Handy, PA-C Authorized by: Liberty Handy, PA-C   Critical care provider statement:    Critical care time (minutes):  45   Critical care was necessary to treat or prevent imminent or life-threatening deterioration of the following conditions: GI bleed requiring emergent endoscopy and clipping and ICU; found to have large bleeding varices.   Critical care was time spent personally by me on the following activities:  Discussions with consultants, evaluation of patient's response to treatment, examination of patient, ordering and performing treatments and interventions, ordering and review of laboratory studies, ordering and review of radiographic studies, pulse oximetry, re-evaluation of patient's condition, obtaining history from patient or surrogate and review of old charts   (including critical care time)  Medications Ordered in ED Medications  pantoprazole (PROTONIX) 80 mg in sodium chloride 0.9 % 250 mL (0.32 mg/mL) infusion (8 mg/hr Intravenous New Bag/Given 03/21/19 1053)  sodium chloride flush (NS) 0.9 % injection 3 mL (has no administration in time range)  0.9 %  sodium chloride infusion (has no administration in time range)  acetaminophen (TYLENOL) tablet 650 mg (has no administration in time range)    Or  acetaminophen (TYLENOL) suppository 650 mg (has no administration in time range)  HYDROcodone-acetaminophen (NORCO/VICODIN) 5-325 MG per tablet 1-2 tablet (has no administration in time range)  traZODone (DESYREL) tablet 50 mg (has no administration in time range)  docusate sodium (COLACE) capsule 100 mg (has no administration in time range)  senna-docusate (Senokot-S) tablet 1 tablet (has no administration in time range)  sorbitol 70 % solution 30 mL (has no administration in time range)  magnesium citrate solution 1 Bottle (has no administration in time range)  ondansetron (ZOFRAN) tablet 4 mg (has no administration in time range)    Or  ondansetron (ZOFRAN)  injection 4 mg (has no administration in time range)  albuterol (  PROVENTIL) (2.5 MG/3ML) 0.083% nebulizer solution 2.5 mg (has no administration in time range)  ferrous sulfate tablet 325 mg (has no administration in time range)  HYDROmorphone (DILAUDID) injection 1 mg (has no administration in time range)  pantoprazole (PROTONIX) injection 40 mg (40 mg Intravenous Given 03/21/19 0812)  sodium chloride 0.9 % bolus 1,000 mL (0 mLs Intravenous Stopped 03/21/19 1039)  ondansetron (ZOFRAN) injection 4 mg (4 mg Intravenous Given 03/21/19 1039)     Initial Impression / Assessment and Plan / ED Course  I have reviewed the triage vital signs and the nursing notes.  Pertinent labs & imaging results that were available during my care of the patient were reviewed by me and considered in my medical decision making (see chart for details).  Clinical Course as of Mar 20 1099  Sun Mar 21, 2019  0945 Hemoglobin(!): 10.3 [CG]  0945 HCT(!): 34.4 [CG]  0945 Platelets(!): 94 [CG]  0945 Glucose(!): 324 [CG]  0945 AST(!): 49 [CG]  0945 ALT(!): 50 [CG]  0945 Alkaline Phosphatase(!): 133 [CG]  0952 RN notified me pt has large volume bloody diarrhea in ED, in similar appearance as emeseis.    [CG]  1016 Spoke to Dr Bosie ClosSchooler who agrees with admission for obs H/H and GI will see her to determine timing of EGD, will likely need on prior to admission.    [CG]    Clinical Course User Index [CG] Liberty HandyGibbons, Khyran Riera J, PA-C      ddx includes gastritis vs PUD vs other GIB. Tachycardic on arrival but non toxic, BP normal. No active emesis.  Will obtain labs, EKG, CXR, initiate PPI and IVF, reassess.   Final Clinical Impressions(s) / ED Diagnoses   1058: Work-up as above, remarkable for drop in hemoglobin 13.1 to 10.3 in the last 6 months.  BUN is normal.  Mild transaminitis.  PLTs 94.  Hyperglycemia without diabetic crisis.  Chest x-ray without obvious perforation.  Tachycardia has resolved after IV fluids.  Patient  had large volume bloody diarrhea in ED but no other episodes of emesis, Zofran given to prevent this.  Given volume of GI bleed both upper/lower, GI was consulted.  Dr. Bosie ClosSchooler recommends further evaluation for repeat H&H, GI will see her to determine timing of EGD.  I spoke with hospitalist who has accepted patient.  Patient was updated and she is comfortable with this.  Discussed with EDMD.  No anticoagulants.  No liver disease, varices.  High suspicion for NSAID related GI bleed.  HD stable at transfer of care.  Final diagnoses:  Gastrointestinal hemorrhage with hematemesis    ED Discharge Orders    None       Jerrell MylarGibbons, Khaya Theissen J, PA-C 03/21/19 1101    Loren RacerYelverton, David, MD 03/22/19 1620    Liberty HandyGibbons, Oda Lansdowne J, PA-C 04/06/19 1006    Loren RacerYelverton, David, MD 04/10/19 [phone]

## 2019-03-21 NOTE — TOC Initial Note (Signed)
Transition of Care [facility]) - Initial/Assessment Note    Patient Details  Name: Angela Castillo MRN: [id number] Date of Birth: 12-27-1975  Transition of Care [facility]) CM/SW Contact:    Serigne Kubicek Sherryle Lis, LCSW Phone Number: 03/21/2019, 3:18 PM  Clinical Narrative:   Pt is a 43 year old female who was admitted with the following Dx: GI Bleed. Pt states that prior to being admitted, pt resided at home with her husband and son. Pt states that she is currently employed.   Pt explains that that she currently does not have a PCP and is on her husbands Editor, commissioning.   Pt appeared anxious during TOC assessment as she is worried because her mother had Esophageal Cancer in the past.     Pt states that her goal is to return home with the support of her family. No discharge needs at this time.          Expected Discharge Plan: Home w Home Health Services Barriers to Discharge: No Barriers Identified   Patient Goals and CMS Choice Patient states their goals for this hospitalization and ongoing recovery are:: to return home with the support of her family      Expected Discharge Plan and Services Expected Discharge Plan: Home w Home Health Services In-house Referral: Clinical Social Work   Post Acute Care Choice: Home Health Living arrangements for the past 2 months: Single Family Home                                      Prior Living Arrangements/Services Living arrangements for the past 2 months: Single Family Home Lives with:: Spouse, Minor Children Patient language and need for interpreter reviewed:: Yes Do you feel safe going back to the place where you live?: Yes      Need for Family Participation in Patient Care: No (Comment) Care giver support system in place?: Yes (comment)   Criminal Activity/Legal Involvement Pertinent to Current Situation/Hospitalization: No - Comment as needed  Activities of Daily Living Home Assistive Devices/Equipment: Eyeglasses ADL Screening  (condition at time of admission) Patient's cognitive ability adequate to safely complete daily activities?: Yes Is the patient deaf or have difficulty hearing?: No Does the patient have difficulty seeing, even when wearing glasses/contacts?: No Does the patient have difficulty concentrating, remembering, or making decisions?: No Patient able to express need for assistance with ADLs?: Yes Does the patient have difficulty dressing or bathing?: No Independently performs ADLs?: Yes (appropriate for developmental age) Does the patient have difficulty walking or climbing stairs?: No Weakness of Legs: None Weakness of Arms/Hands: None  Permission Sought/Granted Permission sought to share information with : Case Manager, Family Supports Permission granted to share information with : Yes, Verbal Permission Granted  Share Information with NAME: Krisalyn Marchetta           Emotional Assessment Appearance:: Appears stated age Attitude/Demeanor/Rapport: Engaged, Gracious Affect (typically observed): Anxious, Pleasant Orientation: : Oriented to Self, Oriented to  Time, Oriented to Situation, Oriented to Place   Psych Involvement: No (comment)  Admission diagnosis:  Gastrointestinal hemorrhage with hematemesis [K92.0] Patient Active Problem List   Diagnosis Date Noted  . GI bleed 03/21/2019  . Impingement syndrome of left shoulder 04/21/2017  . Low back pain 03/18/2016  . Left shoulder pain 03/18/2016  . Esophageal reflux 03/18/2016  . Lateral pain of left hip 10/25/2015  . Heel pain, bilateral 05/03/2015  . Obesity (  BMI 30-39.9) 05/03/2015  . Hearing loss in right ear 03/01/2015  . Family history of diabetes mellitus in mother 03/01/2015  . Essential hypertension 01/18/2015  . Anemia, iron deficiency 01/04/2015  . Musculoskeletal chest pain 01/04/2015  . Transaminitis 01/03/2015   PCP:  Patient, No Pcp Per Pharmacy:   [id number] [place], [place] - [address] AT  [facility] [address] [place] [address] Phone: [phone] Fax: [phone]  [facility] [place] - [place] [place] [address] Phone: [phone] Fax: [phone]  [facility] [address] - [place], [place] - [address] [place] [place] [address] Phone: [phone] Fax: [phone]  [facility] [address] [place] - [id number] [place] [id number] Phone: [phone] Fax: [phone]  [facility] [address], [place] - [address]  [place] [address] Phone: [phone] Fax: [phone]     Social Determinants of Health (SDOH) Interventions    Readmission Risk Interventions No flowsheet data found.

## 2019-03-21 NOTE — TOC Progression Note (Signed)
Transition of Care [facility]) - Progression Note    Patient Details  Name: Angela Castillo MRN: [id number] Date of Birth: Aug 03, 1976  Transition of [facility]) CM/SW Contact  Coralyn Helling, [place] Phone Number: 03/21/2019, 12:32 PM  Clinical Narrative:   LCSW consulted for dc planning. Patient has no CSW needs. LCSW signing off. Please submit new consult if CSW needs arise.          Expected Discharge Plan and Services                                                 Social Determinants of Health (SDOH) Interventions    Readmission Risk Interventions No flowsheet data found.

## 2019-03-21 NOTE — Progress Notes (Signed)
Consult request has been received. CSW attempting to follow up at present time  Chene Kasinger M. Aneesh Faller LCSWA Transitions of Care  Clinical Social Worker  Ph: [phone] 

## 2019-03-21 NOTE — ED Notes (Signed)
Patient had a bright red stool.

## 2019-03-21 NOTE — ED Notes (Signed)
Pt made aware urine collection is needed 

## 2019-03-21 NOTE — Plan of Care (Signed)
  Problem: Education: Goal: Knowledge of General Education information will improve Description Including pain rating scale, medication(s)/side effects and non-pharmacologic comfort measures Outcome: Progressing   Problem: Health Behavior/Discharge Planning: Goal: Ability to manage health-related needs will improve Outcome: Progressing   Problem: Clinical Measurements: Goal: Ability to maintain clinical measurements within normal limits will improve Outcome: Progressing Goal: Will remain free from infection Outcome: Progressing Goal: Diagnostic test results will improve Outcome: Progressing Goal: Respiratory complications will improve Outcome: Progressing Goal: Cardiovascular complication will be avoided Outcome: Progressing   Problem: Activity: Goal: Risk for activity intolerance will decrease Outcome: Progressing   Problem: Nutrition: Goal: Adequate nutrition will be maintained Outcome: Progressing   Problem: Coping: Goal: Level of anxiety will decrease Outcome: Progressing   Problem: Elimination: Goal: Will not experience complications related to bowel motility Outcome: Progressing Goal: Will not experience complications related to urinary retention Outcome: Progressing   Problem: Pain Managment: Goal: General experience of comfort will improve Outcome: Progressing   Problem: Safety: Goal: Ability to remain free from injury will improve Outcome: Progressing   Problem: Skin Integrity: Goal: Risk for impaired skin integrity will decrease Outcome: Progressing Plan of care discussed with patient   

## 2019-03-21 NOTE — ED Triage Notes (Signed)
Patient reports hematemesis X3 this morning around 0630 am this morning. No abdominal pain before emesis.  Denies diarrhea.   A/Ox4 Ambulatory to room.

## 2019-03-21 NOTE — H&P (Signed)
History and Physical   Patient: Angela Castillo                            PCP: Patient, No Pcp Per                    DOB: 06-Jul-1976            DOA: 03/21/2019 ZSM:[id number]             DOS: 03/21/2019, 10:44 AM  Patient coming from:   Home  I have personally reviewed patient's medical records, in electronic medical records, including: [place] link, and care everywhere.    Chief Complaint:   Chief Complaint  Patient presents with  . Hematemesis    History of present illness:    Angela Castillo is a 43 y.o. female with medical history significant of extensive history of hypertension, chronic shoulder pain, back pain, headaches, GERD, Fronek anemia of iron deficiency presenting with hematemesis.  Patient reports that she started vomiting around 6 AM this morning had an emesis bag which contained about 500 cc bright red blood emesis.  Reported continuous nausea, and vomited about 3 times by now, all episodes were bloody.  Had 2 normal bowel movement yesterday, in ED patient had a bloody bowel movement.    Patient reports that she has been having epigastric pain off and on the past week, has a history of gastrology reflux disease.  Currently not on medication.  She admits that she has taken 4 ibuprofen for chronic back pain shoulder pain and headaches.  She has taken it last night.  He reveals that she does take NSAIDs such as meloxicam, Aleve.  She reports of no previous history of gastritis, PUD, GI bleed, no history of previous EGD or colonoscopies.  Currently stable, reporting some mild nausea but no further vomiting.  Denies of have any other recent illnesses such as fever, chills.  Denies any upper respiratory symptoms such as cough congestion runny nose or shortness of breath.  Denies any chest pain.  Reporting some abdominal cramping otherwise denies of having any constipation or diarrhea.  Nuys any dysuria.  Denies any joint pain or open wounds or rash.     ED  Course:   The patient was evaluated, witnessed bloody stool Hemoccult was positive,. Vitals are stable mildly elevated blood pressure 146/99, currently 130/84, pulse tacky on arrival heart rate 122, currently 95 , satting 97% room air, hemoglobin 10.3 hematocrit 34.4, (no apparent baseline H&H)  Patient was started on Protonix drip, IV fluids, GI Dr. Michail Sermon was consulted  Requested for patient to be admitted  Review of Systems: As per HPI otherwise 12 point review of systems negative.    Assessment / Plan:   Principal Problem:   GI bleed -Seems to be upper and lower GI bleed, consistent with NSAIDs abuse -Patient will be admitted to monitored floor -N.p.o. -Continue IV Protonix gtt, IV fluids -We will monitor H&H closely -GI Dr. Michail Sermon has been consulted will follow-up according  Active Problems:   Anemia, iron deficiency -P.o., will check iron level, patient is to continue iron supplements once stable and tolerating p.o.    Essential hypertension -She was supposed to be on hypertensive (losartan)medication but she has not been taking -Blood pressure currently stable, will continue to monitor    Obesity (BMI 30-39.9) -Patient was advised on weight loss    Esophageal reflux -Currently on Protonix drip  Chronic pain (back and shoulder area/min headaches) -Patient was advised, to be cautious on analgesics in the future -Alternative methods to ease her pain recommended -Follow-up with PCP closely -Advised on avoid  NSAIDs for now.    DVT prophylaxis: SCD/Compression stockings  Code Status:   Code Status: Full Code  Family Communication:  The above findings and plan of care has been discussed with patient and family in detail, they expressed understanding and agreement of above plan.   Disposition Plan:  Anticipated 1-2 days  Consults called:  None  Admission status: Patient will be admitted as Inpatient, with a greater than 2 midnight length of stay.   ----------------------------------------------------------------------------------------------------------------------  No Known Allergies  Home MEDs:  Prior to Admission medications   Medication Sig Start Date End Date Taking? Authorizing Provider  ibuprofen (ADVIL) 200 MG tablet Take 800 mg by mouth every 6 (six) hours as needed for mild pain.   Yes [provider]  benzonatate (TESSALON) 200 MG capsule Take 1 capsule (200 mg total) by mouth 3 (three) times daily as needed for cough. Patient not taking: Reported on 03/21/2019 04/27/18   Street, [place], PA-C  ferrous sulfate 325 (65 FE) MG tablet Take 1 tablet (325 mg total) by mouth 2 (two) times daily with a meal. Patient not taking: Reported on 03/21/2019 05/01/17   Boykin Nearing, MD  ibuprofen (ADVIL,MOTRIN) 600 MG tablet Take 1 tablet (600 mg total) by mouth every 6 (six) hours as needed for moderate pain. Patient not taking: Reported on 03/21/2019 07/13/18   Julianne Rice, MD  losartan (COZAAR) 100 MG tablet TAKE 1 TABLET BY MOUTH ONCE DAILY Patient not taking: Reported on 03/21/2019 09/15/17   Tresa Garter, MD  losartan (COZAAR) 100 MG tablet Take 1 tablet (100 mg total) by mouth daily. Patient not taking: Reported on 03/21/2019 04/27/18   Street, [place], PA-C  meloxicam (MOBIC) 15 MG tablet Take 1 tablet (15 mg total) by mouth daily. Patient not taking: Reported on 03/21/2019 05/01/17   Boykin Nearing, MD  methocarbamol (ROBAXIN) 500 MG tablet Take 1 tablet (500 mg total) by mouth every 8 (eight) hours as needed for muscle spasms. Patient not taking: Reported on 03/21/2019 07/13/18   Julianne Rice, MD  traMADol (ULTRAM) 50 MG tablet Take 1 tablet (50 mg total) by mouth every 8 (eight) hours as needed. Patient not taking: Reported on 03/21/2019 05/01/17   Boykin Nearing, MD    PRN MEDs: acetaminophen **OR** acetaminophen, albuterol, HYDROcodone-acetaminophen, HYDROmorphone (DILAUDID) injection, magnesium citrate,  ondansetron **OR** ondansetron (ZOFRAN) IV, senna-docusate, sorbitol, traZODone  Past Medical History:  Diagnosis Date  . Anemia Dx January 03 2015  . Elevated liver enzymes   . Hypertension Dx Apr 2016  . Reflux     Past Surgical History:  Procedure Laterality Date  . TUBAL LIGATION  Jul 25, 2000     reports that she has never smoked. She has never used smokeless tobacco. She reports current alcohol use. She reports that she does not use drugs.   Family History  Problem Relation Age of Onset  . COPD Mother   . Esophageal cancer Mother   . Diabetes Mother   . Cancer Mother   . Hypertension Mother   . Diabetes Father     Physical Exam:     Constitutional: NAD, calm, comfortable Vitals:   03/21/19 0722 03/21/19 0744 03/21/19 0957 03/21/19 1000  BP: (!) 143/98  (!) 146/99 130/84  Pulse: (!) 120  96 95  Resp: 18  19  19  Temp: 98.5 F (36.9 C)     TempSrc: Oral     SpO2: 98%  98% 97%  Weight:  111.6 kg    Height:  5' 3"  (1.6 m)     Eyes: PERRL, lids and conjunctivae normal ENMT: Mucous membranes are moist. Posterior pharynx clear of any exudate or lesions.Normal dentition.  Neck: normal, supple, no masses, no thyromegaly Respiratory: clear to auscultation bilaterally, no wheezing, no crackles. Normal respiratory effort. No accessory muscle use.  Cardiovascular: Regular rate and rhythm, no murmurs / rubs / gallops. No extremity edema. 2+ pedal pulses. No carotid bruits.  Abdomen: no tenderness, no masses palpated. No hepatosplenomegaly. Bowel sounds positive.  Musculoskeletal: no clubbing / cyanosis. No joint deformity upper and lower extremities. Good ROM, no contractures. Normal muscle tone.  Neurologic: CN II-XII grossly intact. Sensation intact, DTR normal. Strength 5/5 in all 4.  Psychiatric: Normal judgment and insight. Alert and oriented x 3. Normal mood.  Skin: no rashes, lesions, ulcers. No induration Decubitus/ulcers: None visible Urinary catheter: none  Labs  on admission:    I have personally reviewed following labs and imaging studies  CBC: Recent Labs  Lab 03/21/19 0757  WBC 6.9  HGB 10.3*  HCT 34.4*  MCV 87.8  PLT 94*   Basic Metabolic Panel: Recent Labs  Lab 03/21/19 0757  NA 137  K 4.2  CL 106  CO2 24  GLUCOSE 324*  BUN 13  CREATININE 0.54  CALCIUM 8.6*   GFR: Estimated Creatinine Clearance: 110.1 mL/min (by C-G formula based on SCr of 0.54 mg/dL). Liver Function Tests: Recent Labs  Lab 03/21/19 0757  AST 49*  ALT 50*  ALKPHOS 133*  BILITOT 0.7  PROT 7.3  ALBUMIN 3.3*   Recent Labs  Lab 03/21/19 0756  LIPASE 35   Urine analysis:    Component Value Date/Time   COLORURINE YELLOW 03/21/2019 0940   APPEARANCEUR CLEAR 03/21/2019 0940   LABSPEC 1.022 03/21/2019 0940   PHURINE 8.0 03/21/2019 0940   GLUCOSEU >=500 (A) 03/21/2019 0940   HGBUR NEGATIVE 03/21/2019 0940   BILIRUBINUR NEGATIVE 03/21/2019 0940   BILIRUBINUR small 02/06/2017 1633   KETONESUR 5 (A) 03/21/2019 0940   PROTEINUR NEGATIVE 03/21/2019 0940   UROBILINOGEN 1.0 02/06/2017 1633   UROBILINOGEN 1.0 01/03/2015 1138   NITRITE NEGATIVE 03/21/2019 0940   LEUKOCYTESUR NEGATIVE 03/21/2019 0940     Radiologic Exams on Admission:   Dg Chest Portable 1 View  Result Date: 03/21/2019 CLINICAL DATA:  Large volume hematemesis EXAM: PORTABLE CHEST 1 VIEW COMPARISON:  08/16/2015 FINDINGS: Lungs are clear.  No pleural effusion or pneumothorax. The heart is normal in size.  No evidence of pneumomediastinum. IMPRESSION: No evidence of acute cardiopulmonary disease. No evidence of pneumomediastinum. Electronically Signed   By: Julian Hy M.D.   On: 03/21/2019 10:37    EKG:   Independently reviewed.  Orders placed or performed during the hospital encounter of 03/21/19  . ED EKG  . ED EKG  . EKG 12-Lead  . EKG 12-Lead  . EKG 12-Lead     Time spent: > than  50 Min.   Deatra James MD [facility] ,  Pager [phone]  If  7PM-7AM, please contact night-coverage [email or web address]  Password [facility] 03/21/2019, 10:44 AM

## 2019-03-21 NOTE — Progress Notes (Signed)
Pt had a bm with a large amount of bright red blood.  Dr Flossie Dibble came in the room as pt was getting back into bed.  He was notified.

## 2019-03-21 NOTE — H&P (View-Only) (Signed)
Referring Provider: Dr. Roger Shelter Primary Care Physician:  Patient, No Pcp Per Primary Gastroenterologist:  Althia Forts  Reason for Consultation:  Hematemesis  HPI: Angela Castillo is a 43 y.o. female with acute onset of hematemesis early this morning. Reports that she belched and felt nauseous early this morning and then shortly after had profuse bright red blood vomiting X 3 that filled her trash can. She brought in about 500 cc of bright red blood in a bag to the ER. Had an episode of bright red blood per rectum in the ER and again upon admit to her room but denies any vomiting since coming to the hospital. Denies any black stools. Denied any associated abdominal pain to me but ER note reports that she had some mild epigastric pain for the last week that occurred after eating and improved with water. Occasional GERD but denies any recent issues and does not take any reflux meds on a regular basis. Denies dysphagia. Has taken Ibuprofen about 3-4 times in the last 2 weeks and denies other NSAIDs. Rare alcohol. No history of ulcers or previous GI bleeding. No previous colonoscopy or EGD. Hgb 10.3 on admit. COVID-19 test negative.  Past Medical History:  Diagnosis Date  . Anemia Dx January 03 2015  . Elevated liver enzymes   . Hypertension Dx Apr 2016  . Reflux     Past Surgical History:  Procedure Laterality Date  . TUBAL LIGATION  Jul 25, 2000    Prior to Admission medications   Medication Sig Start Date End Date Taking? Authorizing Provider  ibuprofen (ADVIL) 200 MG tablet Take 800 mg by mouth every 6 (six) hours as needed for mild pain.   Yes [provider]  benzonatate (TESSALON) 200 MG capsule Take 1 capsule (200 mg total) by mouth 3 (three) times daily as needed for cough. Patient not taking: Reported on 03/21/2019 04/27/18   Street, [place], PA-C  ferrous sulfate 325 (65 FE) MG tablet Take 1 tablet (325 mg total) by mouth 2 (two) times daily with a meal. Patient not  taking: Reported on 03/21/2019 05/01/17   Boykin Nearing, MD  ibuprofen (ADVIL,MOTRIN) 600 MG tablet Take 1 tablet (600 mg total) by mouth every 6 (six) hours as needed for moderate pain. Patient not taking: Reported on 03/21/2019 07/13/18   Julianne Rice, MD  losartan (COZAAR) 100 MG tablet TAKE 1 TABLET BY MOUTH ONCE DAILY Patient not taking: Reported on 03/21/2019 09/15/17   Tresa Garter, MD  losartan (COZAAR) 100 MG tablet Take 1 tablet (100 mg total) by mouth daily. Patient not taking: Reported on 03/21/2019 04/27/18   Street, [place], PA-C  meloxicam (MOBIC) 15 MG tablet Take 1 tablet (15 mg total) by mouth daily. Patient not taking: Reported on 03/21/2019 05/01/17   Boykin Nearing, MD  methocarbamol (ROBAXIN) 500 MG tablet Take 1 tablet (500 mg total) by mouth every 8 (eight) hours as needed for muscle spasms. Patient not taking: Reported on 03/21/2019 07/13/18   Julianne Rice, MD  traMADol (ULTRAM) 50 MG tablet Take 1 tablet (50 mg total) by mouth every 8 (eight) hours as needed. Patient not taking: Reported on 03/21/2019 05/01/17   Boykin Nearing, MD    Scheduled Meds: . docusate sodium  100 mg Oral BID  . ferrous sulfate  325 mg Oral BID WC  . sodium chloride flush  3 mL Intravenous Q12H   Continuous Infusions: . sodium chloride 100 mL/hr at 03/21/19 1205  . pantoprozole (PROTONIX) infusion 8 mg/hr (03/21/19 1053)  PRN Meds:.acetaminophen **OR** acetaminophen, albuterol, HYDROcodone-acetaminophen, HYDROmorphone (DILAUDID) injection, magnesium citrate, ondansetron **OR** ondansetron (ZOFRAN) IV, senna-docusate, sorbitol, traZODone  Allergies as of 03/21/2019  . (No Known Allergies)    Family History  Problem Relation Age of Onset  . COPD Mother   . Esophageal cancer Mother   . Diabetes Mother   . Cancer Mother   . Hypertension Mother   . Diabetes Father     Social History   Socioeconomic History  . Marital status: Married    Spouse name: Not on file  .  Number of children: Not on file  . Years of education: Not on file  . Highest education level: Not on file  Occupational History  . Not on file  Social Needs  . Financial resource strain: Not on file  . Food insecurity:    Worry: Not on file    Inability: Not on file  . Transportation needs:    Medical: Not on file    Non-medical: Not on file  Tobacco Use  . Smoking status: Never Smoker  . Smokeless tobacco: Never Used  Substance and Sexual Activity  . Alcohol use: Yes    Comment: rarely  . Drug use: No  . Sexual activity: Not on file  Lifestyle  . Physical activity:    Days per week: Not on file    Minutes per session: Not on file  . Stress: Not on file  Relationships  . Social connections:    Talks on phone: Not on file    Gets together: Not on file    Attends religious service: Not on file    Active member of club or organization: Not on file    Attends meetings of clubs or organizations: Not on file    Relationship status: Not on file  . Intimate partner violence:    Fear of current or ex partner: Not on file    Emotionally abused: Not on file    Physically abused: Not on file    Forced sexual activity: Not on file  Other Topics Concern  . Not on file  Social History Narrative  . Not on file    Review of Systems: All negative except as stated above in HPI.  Physical Exam: Vital signs: Vitals:   03/21/19 1152 03/21/19 1313  BP: (!) 141/90 134/81  Pulse: (!) 103 98  Resp: 14 16  Temp: 99.4 F (37.4 C) 99.1 F (37.3 C)  SpO2: 97% 97%   Last BM Date: 03/21/19 General:   Lethargic, obese, pleasant, no acute distress Head: normocephalic, atraumatic Eyes: anicteric sclera ENT: oropharynx clear Neck: supple, nontender Lungs:  Clear throughout to auscultation.   No wheezes, crackles, or rhonchi. No acute distress. Heart:  Regular rate and rhythm; no murmurs, clicks, rubs,  or gallops. Abdomen: soft, nontender, nondistended, +BS, obese Rectal:   Deferred Ext: no edema  GI:  Lab Results: Recent Labs    03/21/19 0757  WBC 6.9  HGB 10.3*  HCT 34.4*  PLT 94*   BMET Recent Labs    03/21/19 0757 03/21/19 1257  NA 137  --   K 4.2  --   CL 106  --   CO2 24  --   GLUCOSE 324*  --   BUN 13  --   CREATININE 0.54  --   CALCIUM 8.6* 8.0*   LFT Recent Labs    03/21/19 0757  PROT 7.3  ALBUMIN 3.3*  AST 49*  ALT 50*  ALKPHOS 133*  BILITOT 0.7   PT/INR Recent Labs    03/21/19 1257  LABPROT 16.1*  INR 1.3*     Studies/Results: Dg Chest Portable 1 View  Result Date: 03/21/2019 CLINICAL DATA:  Large volume hematemesis EXAM: PORTABLE CHEST 1 VIEW COMPARISON:  08/16/2015 FINDINGS: Lungs are clear.  No pleural effusion or pneumothorax. The heart is normal in size.  No evidence of pneumomediastinum. IMPRESSION: No evidence of acute cardiopulmonary disease. No evidence of pneumomediastinum. Electronically Signed   By: Julian Hy M.D.   On: 03/21/2019 10:37    Impression/Plan: GI bleed with hematemesis and hematochezia - hemodynamically stable. Question Mallory Weiss tear vs peptic ulcer disease. Protonix drip. Antiemetics prn. Clear liquid diet. IVFs. NPO p MN for EGD tomorrow to further evaluate. If EGD ok, then can be discharged tomorrow afternoon if she is stable.    LOS: 0 days   [name]  03/21/2019, 3:14 PM  Questions please call [phone]

## 2019-03-21 NOTE — ED Notes (Addendum)
ED TO INPATIENT HANDOFF REPORT  ED Nurse Name and Phone #: [phone]  S Name/Age/Gender Angela Castillo 43 y.o. female Room/Bed: WA05/WA05  Code Status   Code Status: Full Code  Home/SNF/Other Home Patient oriented to: self, place, time and situation Is this baseline? Yes   Triage Complete: Triage complete  Chief Complaint blood in emesis   Triage Note Patient reports hematemesis X3 this morning around 0630 am this morning. No abdominal pain before emesis.  Denies diarrhea.   A/Ox4 Ambulatory to room.   Allergies No Known Allergies  Level of Care/Admitting Diagnosis ED Disposition    ED Disposition Condition Comment   Admit  Hospital Area: [facility] [[id number]]  Level of Care: Med-Surg [16]  Covid Evaluation: Screening Protocol (No Symptoms)  Diagnosis: GI bleed [159458]  Admitting Physician: Felipa Furnace  Attending Physician: Felipa Furnace  Estimated length of stay: 3 - 4 days  Certification:: I certify this patient will need inpatient services for at least 2 midnights  PT Class (Do Not Modify): Inpatient [101]  PT Acc Code (Do Not Modify): Private [1]       B Medical/Surgery History Past Medical History:  Diagnosis Date  . Anemia Dx January 03 2015  . Elevated liver enzymes   . Hypertension Dx Apr 2016  . Reflux    Past Surgical History:  Procedure Laterality Date  . TUBAL LIGATION  Jul 25, 2000     A IV Location/Drains/Wounds Patient Lines/Drains/Airways Status   Active Line/Drains/Airways    Name:   Placement date:   Placement time:   Site:   Days:   Peripheral IV 03/21/19 Left Antecubital   03/21/19    0747    Antecubital   less than 1   Peripheral IV 03/21/19 Left Wrist   03/21/19    0748    Wrist   less than 1          Intake/Output Last 24 hours  Intake/Output Summary (Last 24 hours) at 03/21/2019 1122 Last data filed at 03/21/2019 1039 Gross per 24 hour  Intake 958.31 ml  Output -   Net 958.31 ml    Labs/Imaging Results for orders placed or performed during the hospital encounter of 03/21/19 (from the past 48 hour(s))  Lipase, blood     Status: None   Collection Time: 03/21/19  7:56 AM  Result Value Ref Range   Lipase 35 11 - 51 U/L    Comment: Performed at [facility], [address] [place], [place] [address]  Comprehensive metabolic panel     Status: Abnormal   Collection Time: 03/21/19  7:57 AM  Result Value Ref Range   Sodium 137 135 - 145 mmol/L   Potassium 4.2 3.5 - 5.1 mmol/L   Chloride 106 98 - 111 mmol/L   CO2 24 22 - 32 mmol/L   Glucose, Bld 324 (H) 70 - 99 mg/dL   BUN 13 6 - 20 mg/dL   Creatinine, Ser 4.46 0.44 - 1.00 mg/dL   Calcium 8.6 (L) 8.9 - 10.3 mg/dL   Total Protein 7.3 6.5 - 8.1 g/dL   Albumin 3.3 (L) 3.5 - 5.0 g/dL   AST 49 (H) 15 - 41 U/L   ALT 50 (H) 0 - 44 U/L   Alkaline Phosphatase 133 (H) 38 - 126 U/L   Total Bilirubin 0.7 0.3 - 1.2 mg/dL   GFR calc non Af Amer >60 >60 mL/min   GFR calc Af Amer >  60 >60 mL/min   Anion gap 7 5 - 15    Comment: Performed at [facility], [address] [place], [place] [address]  CBC     Status: Abnormal   Collection Time: 03/21/19  7:57 AM  Result Value Ref Range   WBC 6.9 4.0 - 10.5 K/uL   RBC 3.92 3.87 - 5.11 MIL/uL   Hemoglobin 10.3 (L) 12.0 - 15.0 g/dL   HCT 62.1 (L) 30.8 - 65.7 %   MCV 87.8 80.0 - 100.0 fL   MCH 26.3 26.0 - 34.0 pg   MCHC 29.9 (L) 30.0 - 36.0 g/dL   RDW 84.6 96.2 - 95.2 %   Platelets 94 (L) 150 - 400 K/uL    Comment: REPEATED TO VERIFY SPECIMEN CHECKED FOR CLOTS Immature Platelet Fraction may be clinically indicated, consider ordering this additional test [id number]    nRBC 0.0 0.0 - 0.2 %    Comment: Performed at [facility], [address] [place], [place] [address]  Type and screen [facility]     Status: None   Collection Time: 03/21/19  7:57 AM  Result Value Ref Range    ABO/RH(D) O POS    Antibody Screen NEG    Sample Expiration      03/24/2019,2359 Performed at [facility], [address], [place], [place] [address]   I-Stat beta hCG blood, ED     Status: None   Collection Time: 03/21/19  7:57 AM  Result Value Ref Range   I-stat hCG, quantitative <5.0 <5 mIU/mL   Comment 3            Comment:   GEST. AGE      CONC.  (mIU/mL)   <=1 WEEK        5 - 50     2 WEEKS       50 - 500     3 WEEKS       100 - 10,000     4 WEEKS     1,000 - 30,000        FEMALE AND NON-PREGNANT FEMALE:     LESS THAN 5 mIU/mL   Urinalysis, Routine w reflex microscopic     Status: Abnormal   Collection Time: 03/21/19  9:40 AM  Result Value Ref Range   Color, Urine YELLOW YELLOW   APPearance CLEAR CLEAR   Specific Gravity, Urine 1.022 1.005 - 1.030   pH 8.0 5.0 - 8.0   Glucose, UA >=500 (A) NEGATIVE mg/dL   Hgb urine dipstick NEGATIVE NEGATIVE   Bilirubin Urine NEGATIVE NEGATIVE   Ketones, ur 5 (A) NEGATIVE mg/dL   Protein, ur NEGATIVE NEGATIVE mg/dL   Nitrite NEGATIVE NEGATIVE   Leukocytes,Ua NEGATIVE NEGATIVE   RBC / HPF 0-5 0 - 5 RBC/hpf   WBC, UA 0-5 0 - 5 WBC/hpf   Bacteria, UA RARE (A) NONE SEEN   Squamous Epithelial / LPF 0-5 0 - 5   Mucus PRESENT    Budding Yeast PRESENT     Comment: Performed at [facility], [address], [place], [place] [address]   Dg Chest Portable 1 View  Result Date: 03/21/2019 CLINICAL DATA:  Large volume hematemesis EXAM: PORTABLE CHEST 1 VIEW COMPARISON:  08/16/2015 FINDINGS: Lungs are clear.  No pleural effusion or pneumothorax. The heart is normal in size.  No evidence of pneumomediastinum. IMPRESSION: No evidence of acute cardiopulmonary disease. No evidence of pneumomediastinum. Electronically Signed  By: Charline BillsSriyesh  Krishnan M.D.   On: 03/21/2019 10:37    Pending Labs Unresulted Labs (From admission, onward)    Start     Ordered   03/22/19 0500  Basic metabolic panel  Daily,   R      03/21/19 1037   03/22/19 0500  CBC  Daily,   R     03/21/19 1037   03/22/19 0500  Protime-INR  Tomorrow morning,   R     03/21/19 1037   03/22/19 0500  APTT  Tomorrow morning,   R     03/21/19 1037   03/21/19 1034  Protime-INR  Once,   R     03/21/19 1037   03/21/19 1028  HIV antibody (Routine Testing)  Once,   R     03/21/19 1037   03/21/19 1028  Calcium  Once,   R     03/21/19 1037   03/21/19 1028  Magnesium  Once,   R     03/21/19 1037   03/21/19 1028  Phosphorus  Once,   R     03/21/19 1037   03/21/19 1028  Brain natriuretic peptide  Once,   R     03/21/19 1037   03/21/19 1028  TSH  Once,   R     03/21/19 1037   03/21/19 1028  Hemoglobin A1c  Once,   R     03/21/19 1037   03/21/19 1016  SARS Coronavirus 2 (CEPHEID - Performed [facility] lab), Hosp Order  (Asymptomatic Patients Labs)  Once,   R    Question:  Rule Out  Answer:  Yes   03/21/19 1015          Vitals/Pain Today's Vitals   03/21/19 0722 03/21/19 0744 03/21/19 0957 03/21/19 1000  BP: (!) 143/98  (!) 146/99 130/84  Pulse: (!) 120  96 95  Resp: 18  19 19   Temp: 98.5 F (36.9 C)     TempSrc: Oral     SpO2: 98%  98% 97%  Weight:  111.6 kg    Height:  5\' 3"  (1.6 m)    PainSc:  2       Isolation Precautions No active isolations  Medications Medications  pantoprazole (PROTONIX) 80 mg in sodium chloride 0.9 % 250 mL (0.32 mg/mL) infusion (8 mg/hr Intravenous New Bag/Given 03/21/19 1053)  sodium chloride flush (NS) 0.9 % injection 3 mL (has no administration in time range)  0.9 %  sodium chloride infusion (has no administration in time range)  acetaminophen (TYLENOL) tablet 650 mg (has no administration in time range)    Or  acetaminophen (TYLENOL) suppository 650 mg (has no administration in time range)  HYDROcodone-acetaminophen (NORCO/VICODIN) 5-325 MG per tablet 1-2 tablet (has no administration in time range)  traZODone (DESYREL) tablet 50 mg (has no administration in time range)   docusate sodium (COLACE) capsule 100 mg (has no administration in time range)  senna-docusate (Senokot-S) tablet 1 tablet (has no administration in time range)  sorbitol 70 % solution 30 mL (has no administration in time range)  magnesium citrate solution 1 Bottle (has no administration in time range)  ondansetron (ZOFRAN) tablet 4 mg (has no administration in time range)    Or  ondansetron (ZOFRAN) injection 4 mg (has no administration in time range)  albuterol (PROVENTIL) (2.5 MG/3ML) 0.083% nebulizer solution 2.5 mg (has no administration in time range)  ferrous sulfate tablet 325 mg (has no administration in time range)  HYDROmorphone (DILAUDID)  injection 1 mg (has no administration in time range)  pantoprazole (PROTONIX) injection 40 mg (40 mg Intravenous Given 03/21/19 0812)  sodium chloride 0.9 % bolus 1,000 mL (0 mLs Intravenous Stopped 03/21/19 1039)  ondansetron (ZOFRAN) injection 4 mg (4 mg Intravenous Given 03/21/19 1039)    Mobility walks Low fall risk   Focused Assessments GI bleed   R Recommendations: See Admitting Provider Note  Report given to: Victorino Dike, RN  Additional Notes: none

## 2019-03-22 ENCOUNTER — Inpatient Hospital Stay (HOSPITAL_COMMUNITY): Payer: BC Managed Care – PPO | Admitting: Certified Registered Nurse Anesthetist

## 2019-03-22 ENCOUNTER — Inpatient Hospital Stay (HOSPITAL_COMMUNITY): Payer: BC Managed Care – PPO

## 2019-03-22 ENCOUNTER — Encounter (HOSPITAL_COMMUNITY): Payer: Self-pay

## 2019-03-22 ENCOUNTER — Encounter (HOSPITAL_COMMUNITY)
Admission: EM | Disposition: A | Discharge status: Home / Self Care | Payer: Self-pay | Source: Home / Self Care | Attending: Internal Medicine

## 2019-03-22 DIAGNOSIS — K920 Hematemesis: Secondary | ICD-10-CM

## 2019-03-22 DIAGNOSIS — R578 Other shock: Secondary | ICD-10-CM

## 2019-03-22 DIAGNOSIS — J9589 Other postprocedural complications and disorders of respiratory system, not elsewhere classified: Secondary | ICD-10-CM

## 2019-03-22 HISTORY — PX: ESOPHAGEAL BANDING: SHX5518

## 2019-03-22 HISTORY — PX: ESOPHAGOGASTRODUODENOSCOPY: SHX5428

## 2019-03-22 LAB — CBC
HCT: 24.2 % — ABNORMAL LOW (ref 36.0–46.0)
HCT: 29.4 % — ABNORMAL LOW (ref 36.0–46.0)
HCT: 28.1 % — ABNORMAL LOW (ref 36.0–46.0)
Hemoglobin: 7.4 g/dL — ABNORMAL LOW (ref 12.0–15.0)
Hemoglobin: 9.2 g/dL — ABNORMAL LOW (ref 12.0–15.0)
Hemoglobin: 8.9 g/dL — ABNORMAL LOW (ref 12.0–15.0)
MCH: 27.2 pg (ref 26.0–34.0)
MCH: 28.5 pg (ref 26.0–34.0)
MCH: 28.4 pg (ref 26.0–34.0)
MCHC: 30.6 g/dL (ref 30.0–36.0)
MCHC: 31.3 g/dL (ref 30.0–36.0)
MCHC: 31.7 g/dL (ref 30.0–36.0)
MCV: 89 fL (ref 80.0–100.0)
MCV: 91 fL (ref 80.0–100.0)
MCV: 89.8 fL (ref 80.0–100.0)
Platelets: 95 10*3/uL — ABNORMAL LOW (ref 150–400)
Platelets: 137 10*3/uL — ABNORMAL LOW (ref 150–400)
Platelets: 140 10*3/uL — ABNORMAL LOW (ref 150–400)
RBC: 2.72 MIL/uL — ABNORMAL LOW (ref 3.87–5.11)
RBC: 3.23 MIL/uL — ABNORMAL LOW (ref 3.87–5.11)
RBC: 3.13 MIL/uL — ABNORMAL LOW (ref 3.87–5.11)
RDW: 15 % (ref 11.5–15.5)
RDW: 14.6 % (ref 11.5–15.5)
RDW: 14.9 % (ref 11.5–15.5)
WBC: 7.7 10*3/uL (ref 4.0–10.5)
WBC: 15.2 10*3/uL — ABNORMAL HIGH (ref 4.0–10.5)
WBC: 19.5 10*3/uL — ABNORMAL HIGH (ref 4.0–10.5)
nRBC: 0 % (ref 0.0–0.2)
nRBC: 0 % (ref 0.0–0.2)
nRBC: 0 % (ref 0.0–0.2)

## 2019-03-22 LAB — BASIC METABOLIC PANEL
Anion gap: 4 — ABNORMAL LOW (ref 5–15)
Anion gap: 3 — ABNORMAL LOW (ref 5–15)
BUN: 22 mg/dL — ABNORMAL HIGH (ref 6–20)
BUN: 17 mg/dL (ref 6–20)
CO2: 24 mmol/L (ref 22–32)
CO2: 24 mmol/L (ref 22–32)
Calcium: 7.8 mg/dL — ABNORMAL LOW (ref 8.9–10.3)
Calcium: 7.5 mg/dL — ABNORMAL LOW (ref 8.9–10.3)
Chloride: 109 mmol/L (ref 98–111)
Chloride: 110 mmol/L (ref 98–111)
Creatinine, Ser: 0.48 mg/dL (ref 0.44–1.00)
Creatinine, Ser: 0.57 mg/dL (ref 0.44–1.00)
GFR calc Af Amer: >60 mL/min (ref >60)
GFR calc Af Amer: >60 mL/min (ref >60)
GFR calc non Af Amer: >60 mL/min (ref >60)
GFR calc non Af Amer: >60 mL/min (ref >60)
Glucose, Bld: 286 mg/dL — ABNORMAL HIGH (ref 70–99)
Glucose, Bld: 219 mg/dL — ABNORMAL HIGH (ref 70–99)
Potassium: 4.2 mmol/L (ref 3.5–5.1)
Potassium: 4.4 mmol/L (ref 3.5–5.1)
Sodium: 137 mmol/L (ref 135–145)
Sodium: 137 mmol/L (ref 135–145)

## 2019-03-22 LAB — TRIGLYCERIDES: Triglycerides: 268 mg/dL — ABNORMAL HIGH (ref <150)

## 2019-03-22 LAB — PROTIME-INR
INR: 1.3 — ABNORMAL HIGH (ref 0.8–1.2)
Prothrombin Time: 16.4 seconds — ABNORMAL HIGH (ref 11.4–15.2)

## 2019-03-22 LAB — BLOOD GAS, ARTERIAL
Acid-base deficit: 1.9 mmol/L (ref 0.0–2.0)
Bicarbonate: 23.3 mmol/L (ref 20.0–28.0)
Drawn by: 441261
FIO2: 100
MECHVT: 420 mL
O2 Saturation: 99.5 %
PEEP: 5 cmH2O
Patient temperature: 98.6
RATE: 16 resp/min
pCO2 arterial: 44.8 mmHg (ref 32.0–48.0)
pH, Arterial: 7.336 — ABNORMAL LOW (ref 7.350–7.450)
pO2, Arterial: 201 mmHg — ABNORMAL HIGH (ref 83.0–108.0)

## 2019-03-22 LAB — MRSA PCR SCREENING: MRSA by PCR: NEGATIVE

## 2019-03-22 LAB — GLUCOSE, CAPILLARY
Glucose-Capillary: 230 mg/dL — ABNORMAL HIGH (ref 70–99)
Glucose-Capillary: 160 mg/dL — ABNORMAL HIGH (ref 70–99)
Glucose-Capillary: 215 mg/dL — ABNORMAL HIGH (ref 70–99)
Glucose-Capillary: 222 mg/dL — ABNORMAL HIGH (ref 70–99)
Glucose-Capillary: 198 mg/dL — ABNORMAL HIGH (ref 70–99)

## 2019-03-22 LAB — HIV ANTIBODY (ROUTINE TESTING W REFLEX): HIV Screen 4th Generation wRfx: NONREACTIVE

## 2019-03-22 LAB — POCT I-STAT 4, (NA,K, GLUC, HGB,HCT)
Glucose, Bld: 161 mg/dL — ABNORMAL HIGH (ref 70–99)
HCT: 22 % — ABNORMAL LOW (ref 36.0–46.0)
Hemoglobin: 7.5 g/dL — ABNORMAL LOW (ref 12.0–15.0)
Potassium: 4 mmol/L (ref 3.5–5.1)
Sodium: 142 mmol/L (ref 135–145)

## 2019-03-22 LAB — APTT: aPTT: 28 seconds (ref 24–36)

## 2019-03-22 LAB — TROPONIN I: Troponin I: <0.03 ng/mL (ref <0.03)

## 2019-03-22 LAB — PREPARE RBC (CROSSMATCH)

## 2019-03-22 IMAGING — DX PORTABLE CHEST - 1 VIEW
1 series · 1 of 1 positions shown · non-contrast
Comparison: [DATE]

CLINICAL DATA: Check endotracheal tube placement

EXAM:
PORTABLE CHEST 1 VIEW

[chest ap]
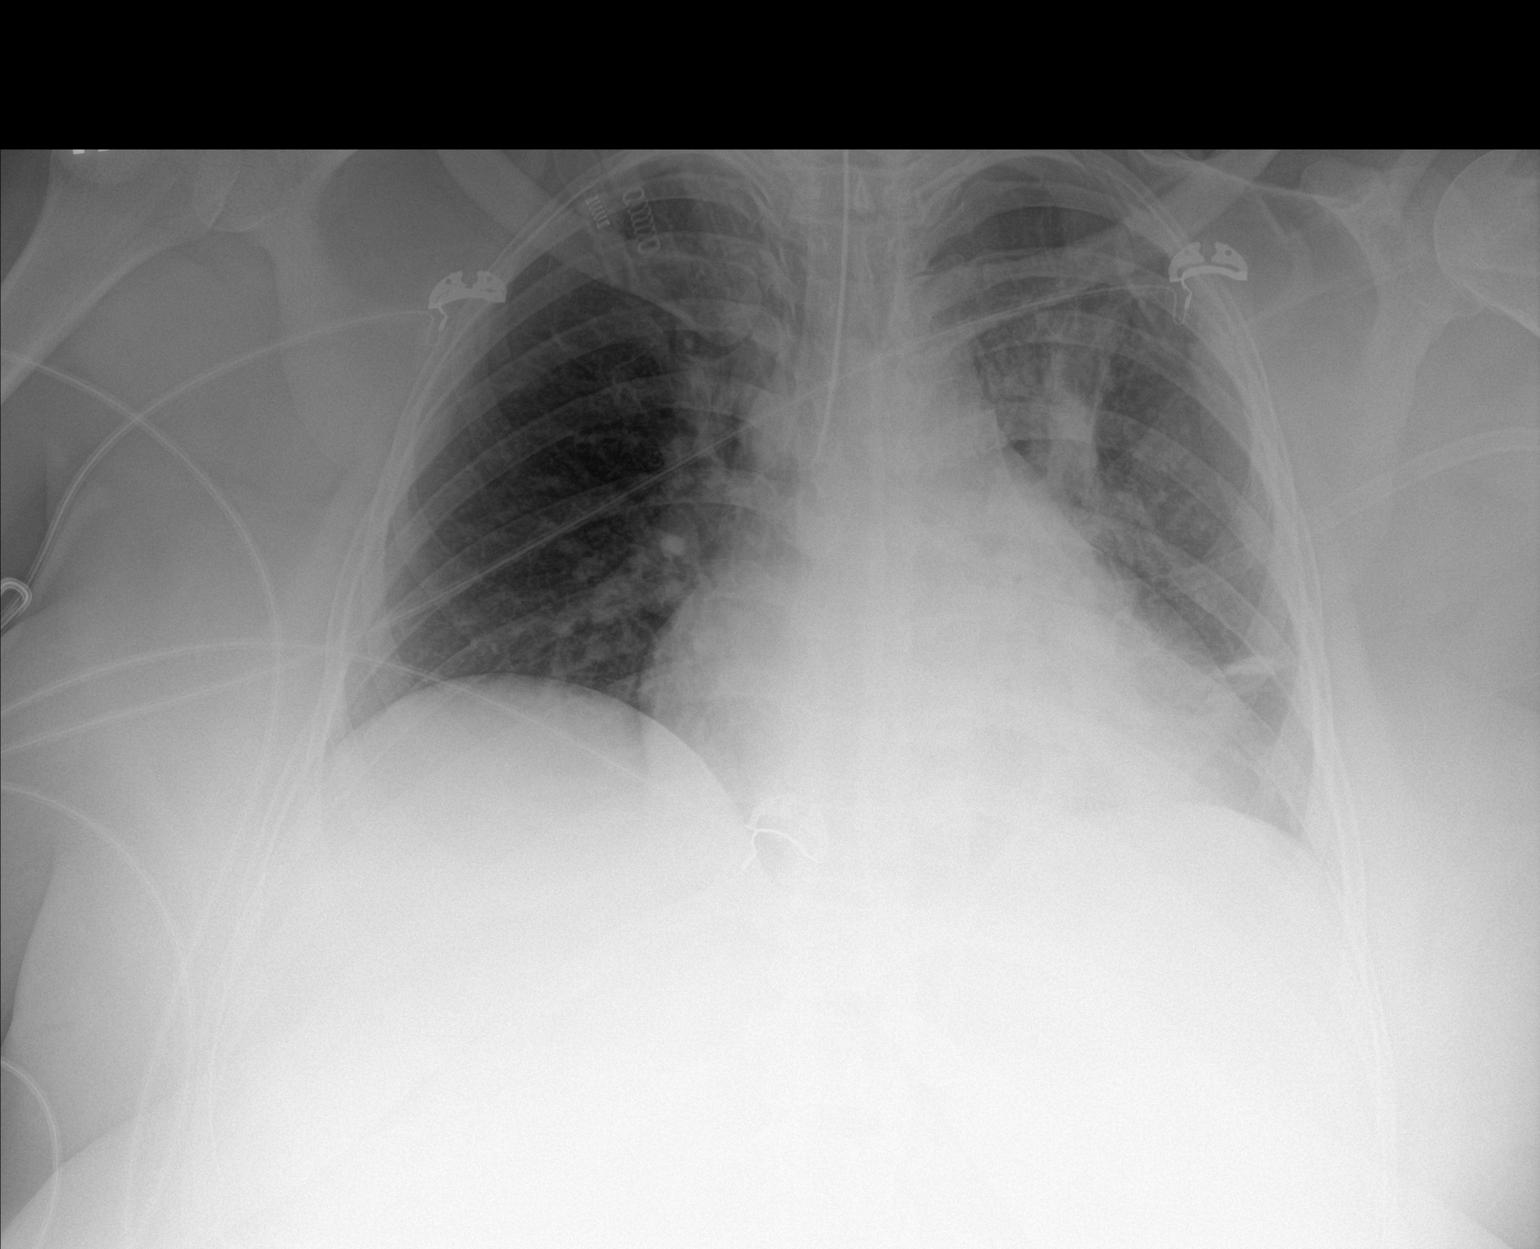

[1 of 1 positions shown; findings below may reference images not displayed]

FINDINGS: Cardiac shadow is mildly prominent but similar to that seen on the
previous day. Endotracheal tube is now seen within the right
mainstem bronchus with new left basilar and left upper lobe
atelectatic changes. This should be withdrawn approximately 3 cm.
The right lung is clear. No bony abnormality is noted.
IMPRESSION: Endotracheal tube within the right mainstem bronchus. This should be
withdrawn approximately 3 cm. Patchy atelectatic changes are noted
in the left lung new from the prior exam consistent with the right
mainstem intubation.

Critical Value/emergent results were called by telephone at the time
of interpretation on [DATE] at [DATE] to PAGE, the patient's
nurse, who verbally acknowledged these results.

## 2019-03-22 SURGERY — EGD (ESOPHAGOGASTRODUODENOSCOPY)
Anesthesia: Monitor Anesthesia Care

## 2019-03-22 MED ORDER — INSULIN ASPART 100 UNIT/ML ~~LOC~~ SOLN
0.0000 [IU] | SUBCUTANEOUS | Status: DC
  Administered 2019-03-22: 2 [IU] via SUBCUTANEOUS
  Administered 2019-03-22 – 2019-03-23 (×6): 3 [IU] via SUBCUTANEOUS
  Administered 2019-03-24: 2 [IU] via SUBCUTANEOUS
  Administered 2019-03-24 – 2019-03-25 (×6): 3 [IU] via SUBCUTANEOUS
  Administered 2019-03-25: 5 [IU] via SUBCUTANEOUS
  Administered 2019-03-25 (×3): 3 [IU] via SUBCUTANEOUS
  Administered 2019-03-25: 2 [IU] via SUBCUTANEOUS
  Administered 2019-03-26: 5 [IU] via SUBCUTANEOUS
  Administered 2019-03-26 (×4): 3 [IU] via SUBCUTANEOUS
  Administered 2019-03-26: 5 [IU] via SUBCUTANEOUS
  Administered 2019-03-27: 2 [IU] via SUBCUTANEOUS
  Administered 2019-03-27: 01:00:00 3 [IU] via SUBCUTANEOUS
  Administered 2019-03-27: 2 [IU] via SUBCUTANEOUS

## 2019-03-22 MED ORDER — PROPOFOL 10 MG/ML IV BOLUS
INTRAVENOUS | Status: DC | PRN
  Administered 2019-03-22: 50 mg via INTRAVENOUS
  Administered 2019-03-22: 100 mg via INTRAVENOUS

## 2019-03-22 MED ORDER — PROPOFOL 1000 MG/100ML IV EMUL
5.0000 ug/kg/min | INTRAVENOUS | Status: DC
  Administered 2019-03-22: 75 ug/kg/min via INTRAVENOUS
  Administered 2019-03-22: 65 ug/kg/min via INTRAVENOUS
  Administered 2019-03-22: 75 ug/kg/min via INTRAVENOUS
  Administered 2019-03-23: 60 ug/kg/min via INTRAVENOUS
  Administered 2019-03-23: 50 ug/kg/min via INTRAVENOUS
  Filled 2019-03-22 (×7): qty 100

## 2019-03-22 MED ORDER — SODIUM CHLORIDE 0.9 % IV SOLN
25.0000 ug/h | INTRAVENOUS | Status: DC
  Administered 2019-03-22 – 2019-03-27 (×8): 25 ug/h via INTRAVENOUS
  Filled 2019-03-22 (×10): qty 1

## 2019-03-22 MED ORDER — BISACODYL 10 MG RE SUPP: 10.0000 mg | Freq: Every day | RECTAL | Status: DC | PRN

## 2019-03-22 MED ORDER — SODIUM CHLORIDE 0.9% IV SOLUTION
Freq: Once | INTRAVENOUS | Status: AC
  Administered 2019-03-22: 14:00:00 via INTRAVENOUS

## 2019-03-22 MED ORDER — ROCURONIUM BROMIDE 100 MG/10ML IV SOLN
INTRAVENOUS | Status: DC | PRN
  Administered 2019-03-22: 30 mg via INTRAVENOUS
  Administered 2019-03-22: 50 mg via INTRAVENOUS

## 2019-03-22 MED ORDER — CHLORHEXIDINE GLUCONATE 0.12% ORAL RINSE (MEDLINE KIT)
15.0000 mL | Freq: Two times a day (BID) | OROMUCOSAL | Status: DC
  Administered 2019-03-22 – 2019-03-25 (×6): 15 mL via OROMUCOSAL

## 2019-03-22 MED ORDER — PROPOFOL 1000 MG/100ML IV EMUL
5.0000 ug/kg/min | INTRAVENOUS | Status: DC
  Administered 2019-03-22: 80 ug/kg/min via INTRAVENOUS

## 2019-03-22 MED ORDER — PROPOFOL 1000 MG/100ML IV EMUL
INTRAVENOUS | Status: AC
  Administered 2019-03-22: 80 ug/kg/min
  Filled 2019-03-22: qty 100

## 2019-03-22 MED ORDER — SUCCINYLCHOLINE CHLORIDE 20 MG/ML IJ SOLN
INTRAMUSCULAR | Status: DC | PRN
  Administered 2019-03-22: 160 mg via INTRAVENOUS

## 2019-03-22 MED ORDER — ORAL CARE MOUTH RINSE
15.0000 mL | OROMUCOSAL | Status: DC
  Administered 2019-03-22 – 2019-03-25 (×26): 15 mL via OROMUCOSAL

## 2019-03-22 MED ORDER — SODIUM CHLORIDE 0.9 % IV SOLN
INTRAVENOUS | Status: DC | PRN
  Administered 2019-03-22: 13:00:00 via INTRAVENOUS

## 2019-03-22 MED ORDER — SODIUM CHLORIDE 0.9 % IV SOLN: 50.0000 ug/h | INTRAVENOUS | Status: DC

## 2019-03-22 MED ORDER — PROPOFOL 500 MG/50ML IV EMUL
INTRAVENOUS | Status: DC | PRN
  Administered 2019-03-22: 150 ug/kg/min via INTRAVENOUS

## 2019-03-22 MED ORDER — PHENYLEPHRINE HCL-NACL 10-0.9 MG/250ML-% IV SOLN
0.0000 ug/min | INTRAVENOUS | Status: DC
  Administered 2019-03-22: 20:00:00 30 ug/min via INTRAVENOUS
  Administered 2019-03-23: 100 ug/min via INTRAVENOUS
  Administered 2019-03-23: 30 ug/min via INTRAVENOUS
  Administered 2019-03-24: 40 ug/min via INTRAVENOUS
  Filled 2019-03-22 (×4): qty 250

## 2019-03-22 MED ORDER — LIDOCAINE 2% (20 MG/ML) 5 ML SYRINGE
INTRAMUSCULAR | Status: DC | PRN
  Administered 2019-03-22: 100 mg via INTRAVENOUS

## 2019-03-22 MED ORDER — SODIUM CHLORIDE 0.9 % IV SOLN
INTRAVENOUS | Status: DC | PRN
  Administered 2019-03-22: 14:00:00 50 ug/min via INTRAVENOUS

## 2019-03-22 MED ORDER — OCTREOTIDE LOAD VIA INFUSION
50.0000 ug | Freq: Once | INTRAVENOUS | Status: AC
  Administered 2019-03-22: 50 ug via INTRAVENOUS
  Filled 2019-03-22: qty 25

## 2019-03-22 MED ORDER — FENTANYL CITRATE (PF) 100 MCG/2ML IJ SOLN
50.0000 ug | INTRAMUSCULAR | Status: DC | PRN
  Filled 2019-03-22: qty 2

## 2019-03-22 MED ORDER — DEXMEDETOMIDINE HCL IN NACL 200 MCG/50ML IV SOLN: 0.0000 ug/kg/h | INTRAVENOUS | Status: DC

## 2019-03-22 MED ORDER — PROPOFOL 1000 MG/100ML IV EMUL
INTRAVENOUS | Status: AC
  Filled 2019-03-22: qty 100

## 2019-03-22 MED ORDER — OCTREOTIDE LOAD VIA INFUSION
50.0000 ug | Freq: Once | INTRAVENOUS | Status: DC
  Administered 2019-03-22: 50 ug via INTRAVENOUS

## 2019-03-22 MED ORDER — LACTATED RINGERS IV SOLN
INTRAVENOUS | Status: DC
  Administered 2019-03-22 (×2): via INTRAVENOUS

## 2019-03-22 NOTE — Anesthesia Procedure Notes (Signed)
Procedure Name: Intubation Date/Time: 03/22/2019 12:37 PM Performed by: Lorelee Market, CRNA Pre-anesthesia Checklist: Patient identified, Emergency Drugs available, Suction available, Patient being monitored and Timeout performed Patient Re-evaluated:Patient Re-evaluated prior to induction Oxygen Delivery Method: Circle system utilized Preoxygenation: Pre-oxygenation with 100% oxygen Induction Type: IV induction and Rapid sequence Laryngoscope Size: Glidescope and 4 Grade View: Grade I Tube type: Oral Tube size: 7.5 mm Number of attempts: 1 Airway Equipment and Method: Stylet Placement Confirmation: ETT inserted through vocal cords under direct vision,  positive ETCO2 and breath sounds checked- equal and bilateral Secured at: 24 cm Tube secured with: Tape Dental Injury: Teeth and Oropharynx as per pre-operative assessment

## 2019-03-22 NOTE — Consult Note (Signed)
NAME:  Angela Castillo, MRN:  [id number], DOB:  Jul 05, 1976, LOS: 1 ADMISSION DATE:  03/21/2019, CONSULTATION DATE:  03/22/19 REFERRING MD:  Dr. Darrick Meigs, CHIEF COMPLAINT:  GIB    Brief History   43 y/o F admitted 5/17 with hematemesis.  Underwent EGD 5/18 .  To ICU post procedure on mechanical ventilation.   History of present illness   43 y/o F who presented to [facility] on 5/17 with reports of acute onset hematemesis.  Per chart review, the patient reported she felt nauseated and was belching on the am of 5/17.  She later had an episode of vomiting of bright red blood that filled her trash can.  On presentation to the ER she had approximately 500 ml of bright red blood in a bag.  Additionally, she had bright red blood per rectum in the ER.  The patient reported on admit that she had mild epigastric discomfort for one week but it had improved with water consumption.  Reportedly rare ETOH use, uses ibuprofen 3-4 times per week in the two weeks preceding admit.  Admit COVID testing negative. On 5/18 she underwent EGD with findings of red blood in the lower third of the esophagus, red blood in the entire stomach, large (>12m) bleeding esophageal varices, incompletely eradicated / banded. The patient was ordered 4 units PRBC and 2 units FFP.  Post EGD, she was returned to ICU on mechanical ventilation.      Past Medical History  GERD HTN Elevated LFT's  Anemia  ETOH - reported rare use  Significant Hospital Events   5/17  Admit  5/18  EGD, tx to ICU   Consults:  PCCM   Procedures:  ETT 5/18 >>   Significant Diagnostic Tests:  EGD 5/18 >> Red blood in the lower third of the esophagus. Red blood in the entire stomach. Bleeding large (> 5 mm) esophageal varices. Incompletely eradicated. Banded.  Micro Data:  COVID 5/17 >> negative   Antimicrobials:    Interim history/subjective:  As above  Objective   Blood pressure 135/75, pulse (!) 105, temperature 99.2 F (37.3 C), resp. rate 20,  height 5' 3"  (1.6 m), weight 116 kg, last menstrual period 02/16/2019, SpO2 97 %.        Intake/Output Summary (Last 24 hours) at 03/22/2019 1348 Last data filed at 03/22/2019 1317 Gross per 24 hour  Intake 2643.28 ml  Output -  Net 2643.28 ml   Filed Weights   03/21/19 0744 03/22/19 0528  Weight: 111.6 kg 116 kg    Examination: Gen. Obese woman in no distress, intubated & sedated ENT - no pallor,icterus, no post nasal drip Neck: No JVD, no thyromegaly, no carotid bruits Lungs: no use of accessory muscles, no dullness to percussion, clear without rales or rhonchi  Cardiovascular: Rhythm regular, heart sounds  normal, no murmurs, no peripheral edema Abdomen: soft and non-tender, no hepatosplenomegaly, BS normal. Musculoskeletal: No deformities, no cyanosis or clubbing Neuro: Sedated on propofol, RA SS -3 Skin:  Warm, no lesions/ rash   Resolved Hospital Problem list     Assessment & Plan:   Acute GIB  -large esophageal varices noted on EGD  Hematemesis / Hematochezia  Fatty liver ? NASH P: GI following, appreciate assistance  Likely will need return look before extubation  PPI gtt  Octreotide gtt  At hemoglobin every 4 hours  Acute Respiratory Insufficiency  -in setting of hematemesis, intubated for airway protection  P: PRVC 8cc/kg  Wean PEEP / fiO2 for sats >  90% Chest x-ray personally reviewed-ET tube pulled back 2 cm ABG one hour after ICU arrival  No extubation / SBT until GI has completed endoscopic interventions, ? If they will take a second look given size of varices / extent of bleeding  Use propofol for sedation goal RA SS -3, fentanyl PRN for breakthrough  Hypotension-related to blood loss and propofol Neo-Synephrine can be tapered to off  Acute Blood Loss Anemia  P: Trend CBC  Transfuse per ICU guidelines Once 2 units completed, follow up CBC at 1700 then q 4h Assess troponin x1, EKG  Hyperglycemia  P: SSI Q4   HTN  P: Hold home cozaar    At Risk AKI  P: Trend BMP / urinary output Replace electrolytes as indicated Avoid nephrotoxic agents, ensure adequate renal perfusion   Best practice:  Diet: NPO  Pain/Anxiety/Delirium protocol (if indicated): ordered  VAP protocol (if indicated): in place  DVT prophylaxis: SCD's  GI prophylaxis: PPI gtt  Glucose control: n/a Mobility: bedrest  Code Status: Full Code  Family Communication: husband per GI Disposition: ICU  Labs   CBC: Recent Labs  Lab 03/21/19 0757 03/22/19 0316  WBC 6.9 7.7  HGB 10.3* 7.4*  HCT 34.4* 24.2*  MCV 87.8 89.0  PLT 94* 95*    Basic Metabolic Panel: Recent Labs  Lab 03/21/19 0757 03/21/19 1257 03/22/19 0316  NA 137  --  137  K 4.2  --  4.2  CL 106  --  109  CO2 24  --  24  GLUCOSE 324*  --  286*  BUN 13  --  22*  CREATININE 0.54  --  0.48  CALCIUM 8.6* 8.0* 7.8*  MG  --  1.7  --   PHOS  --  2.8  --    GFR: Estimated Creatinine Clearance: 112.5 mL/min (by C-G formula based on SCr of 0.48 mg/dL). Recent Labs  Lab 03/21/19 0757 03/22/19 0316  WBC 6.9 7.7    Liver Function Tests: Recent Labs  Lab 03/21/19 0757  AST 49*  ALT 50*  ALKPHOS 133*  BILITOT 0.7  PROT 7.3  ALBUMIN 3.3*   Recent Labs  Lab 03/21/19 0756  LIPASE 35   No results for input(s): AMMONIA in the last 168 hours.  ABG    Component Value Date/Time   TCO2 25 08/16/2015 1742     Coagulation Profile: Recent Labs  Lab 03/21/19 1257 03/22/19 0316  INR 1.3* 1.3*    Cardiac Enzymes: No results for input(s): CKTOTAL, CKMB, CKMBINDEX, TROPONINI in the last 168 hours.  HbA1C: Hemoglobin A1C  Date/Time Value Ref Range Status  06/21/2016 05:34 PM 5.2  Final  06/12/2015 05:49 PM 5.50  Final   Hgb A1c MFr Bld  Date/Time Value Ref Range Status  03/21/2019 12:57 PM 9.5 (H) 4.8 - 5.6 % Final    Comment:    (NOTE) Pre diabetes:          5.7%-6.4% Diabetes:              >6.4% Glycemic control for   <7.0% adults with diabetes     CBG:  Recent Labs  Lab 03/22/19 0744 03/22/19 1123  GLUCAP 230* 160*    Review of Systems:   Unable to complete as patient is on mechanical ventilation / sedate.   Past Medical History  She,  has a past medical history of Anemia (Dx January 03 2015), Elevated liver enzymes, Hypertension (Dx Apr 2016), and Reflux.   Surgical History  Past Surgical History:  Procedure Laterality Date  . TUBAL LIGATION  Jul 25, 2000     Social History   reports that she has never smoked. She has never used smokeless tobacco. She reports current alcohol use. She reports that she does not use drugs.   Family History   Her family history includes COPD in her mother; Cancer in her mother; Diabetes in her father and mother; Esophageal cancer in her mother; Hypertension in her mother.   Allergies No Known Allergies   Home Medications  Prior to Admission medications   Medication Sig Start Date End Date Taking? Authorizing Provider  ibuprofen (ADVIL) 200 MG tablet Take 800 mg by mouth every 6 (six) hours as needed for mild pain.   Yes [provider]  benzonatate (TESSALON) 200 MG capsule Take 1 capsule (200 mg total) by mouth 3 (three) times daily as needed for cough. Patient not taking: Reported on 03/21/2019 04/27/18   Street, [place], PA-C  ferrous sulfate 325 (65 FE) MG tablet Take 1 tablet (325 mg total) by mouth 2 (two) times daily with a meal. Patient not taking: Reported on 03/21/2019 05/01/17   Boykin Nearing, MD  ibuprofen (ADVIL,MOTRIN) 600 MG tablet Take 1 tablet (600 mg total) by mouth every 6 (six) hours as needed for moderate pain. Patient not taking: Reported on 03/21/2019 07/13/18   Julianne Rice, MD  losartan (COZAAR) 100 MG tablet TAKE 1 TABLET BY MOUTH ONCE DAILY Patient not taking: Reported on 03/21/2019 09/15/17   Tresa Garter, MD  losartan (COZAAR) 100 MG tablet Take 1 tablet (100 mg total) by mouth daily. Patient not taking: Reported on 03/21/2019 04/27/18   Street,  [place], PA-C  meloxicam (MOBIC) 15 MG tablet Take 1 tablet (15 mg total) by mouth daily. Patient not taking: Reported on 03/21/2019 05/01/17   Boykin Nearing, MD  methocarbamol (ROBAXIN) 500 MG tablet Take 1 tablet (500 mg total) by mouth every 8 (eight) hours as needed for muscle spasms. Patient not taking: Reported on 03/21/2019 07/13/18   Julianne Rice, MD  traMADol (ULTRAM) 50 MG tablet Take 1 tablet (50 mg total) by mouth every 8 (eight) hours as needed. Patient not taking: Reported on 03/21/2019 05/01/17   Boykin Nearing, MD     Critical care time: 10m    RKara MeadMD. FEncompass Health [facility] [place] [facility] Pager [phone] no response call [phone]  03/22/2019

## 2019-03-22 NOTE — Progress Notes (Signed)
PT Cancellation Note  Patient Details Name: Angela Castillo MRN: [id number] DOB: [birth year]   Cancelled Treatment:    Reason Eval/Treat Not Completed: Medical issues which prohibited therapy--pt now on vent post op. Will sign off. Please reorder once medically ready.     Rebeca Alert, PT Acute Rehabilitation Services Pager: [phone] Office: [phone]

## 2019-03-22 NOTE — Anesthesia Postprocedure Evaluation (Signed)
Anesthesia Post Note  Patient: Angela Castillo  Procedure(s) Performed: ESOPHAGOGASTRODUODENOSCOPY (EGD) (N/A ) ESOPHAGEAL BANDING     Patient location during evaluation: PACU Anesthesia Type: MAC Level of consciousness: awake and alert Pain management: pain level controlled Vital Signs Assessment: post-procedure vital signs reviewed and stable Respiratory status: spontaneous breathing, nonlabored ventilation, respiratory function stable and patient connected to nasal cannula oxygen Cardiovascular status: stable and blood pressure returned to baseline Postop Assessment: no apparent nausea or vomiting Anesthetic complications: no    Last Vitals:  Vitals:   03/22/19 1925 03/22/19 1936  BP:  122/61  Pulse:  (!) 102  Resp:  (!) 32  Temp: 37.7 C   SpO2:  100%    Last Pain:  Vitals:   03/22/19 1925  TempSrc: Oral  PainSc:                  Raahi Korber

## 2019-03-22 NOTE — Anesthesia Preprocedure Evaluation (Addendum)
Anesthesia Evaluation  Patient identified by MRN, date of birth, ID band Patient awake    Reviewed: Allergy & Precautions, NPO status , Patient's Chart, lab work & pertinent test results  Airway Mallampati: II  TM Distance: >3 FB Neck ROM: Full    Dental no notable dental hx.    Pulmonary neg pulmonary ROS,    Pulmonary exam normal breath sounds clear to auscultation       Cardiovascular hypertension, Pt. on medications negative cardio ROS Normal cardiovascular exam Rhythm:Regular Rate:Normal     Neuro/Psych negative neurological ROS  negative psych ROS   GI/Hepatic negative GI ROS, Neg liver ROS, GERD  Medicated,  Endo/Other  Morbid obesity  Renal/GU negative Renal ROS  negative genitourinary   Musculoskeletal negative musculoskeletal ROS (+)   Abdominal   Peds negative pediatric ROS (+)  Hematology negative hematology ROS (+) Blood dyscrasia, anemia ,   Anesthesia Other Findings   Reproductive/Obstetrics negative OB ROS                            Anesthesia Physical Anesthesia Plan  ASA: III  Anesthesia Plan: MAC   Post-op Pain Management:    Induction: Intravenous  PONV Risk Score and Plan: 2  Airway Management Planned: Mask and Natural Airway  Additional Equipment:   Intra-op Plan:   Post-operative Plan:   Informed Consent:   Plan Discussed with:   Anesthesia Plan Comments:         Anesthesia Quick Evaluation

## 2019-03-22 NOTE — Progress Notes (Signed)
Patient ID: Angela Castillo, female   DOB: 1976-04-08, 44 y.o.   MRN: [id number]  Emergency consent obtained for blood transfusions during the active bleeding that was seen during the endoscopy. Emergent blood transfusions recommended by the anesthesiologist and myself. Updated husband following the procedure about her condition and treatment of the bleeding.

## 2019-03-22 NOTE — Progress Notes (Signed)
Inpatient Diabetes Program Recommendations  AACE/ADA: New Consensus Statement on Inpatient Glycemic Control (2015)  Target Ranges:  Prepandial:   less than 140 mg/dL      Peak postprandial:   less than 180 mg/dL (1-2 hours)      Critically ill patients:  140 - 180 mg/dL   Results for Angela Castillo, Angela Castillo (MRN [id number]) as of 03/22/2019 10:48  Ref. Range 03/22/2019 07:44  Glucose-Capillary Latest Ref Range: 70 - 99 mg/dL 818 (H)   Results for Angela Castillo, Angela Castillo (MRN [id number]) as of 03/22/2019 10:48  Ref. Range 03/21/2019 12:57  Hemoglobin A1C Latest Ref Range: 4.8 - 5.6 % 9.5 (H)  (225 mg/dl)     Admit with: Hematemesis/ GIB  History: HTN    Scheduled to have EGD today.  No PCP--On husband's insurance.   MD- Note that pt's Hemoglobin A1c was 9.5% on admission.  Not sure about accuracy as she was admitted with GIB and Hemoglobin of 7.4 g/dl.  Is this a new diagnosis of Diabetes?  Should pt follow up with PCP for additional glucose/ A1c testing after discharge?     --Will follow patient during hospitalization--  Ambrose Finland RN, MSN, CDE Diabetes Coordinator Inpatient Glycemic Control Team Team Pager: [phone] (8a-5p)

## 2019-03-22 NOTE — Progress Notes (Signed)
PT Cancellation Note  Patient Details Name: JENEFFER LEINEN MRN: [id number] DOB: 06-May-1976   Cancelled Treatment:    Reason Eval/Treat Not Completed: Patient at procedure or test/unavailable  Patient not on the floor having a procedure. Will attempt again later time permitting.    Verne Carrow PT, DPT 11:32 AM, 03/22/19 [phone]

## 2019-03-22 NOTE — Progress Notes (Signed)
[facility]  PROGRESS NOTE  Angela Castillo QMG:[id number] DOB: 08/13/1976 DOA: 03/21/2019 PCP: Angela Castillo, No Pcp Per   Brief HPI:   43 year old female with a history of hypertension, chronic shoulder pain, back pain, headache, GERD presented to hospital with hematemesis.  Angela Castillo vomited on 6 AM this morning and had 500 cc of bright red blood emesis x3.  Also had bloody bowel movement in the ED.    Subjective   Angela Castillo seen and examined, no more episodes of hematemesis in the hospital.  Plan for EGD today.   Assessment/Plan:     1. Hematemesis-no previous history peptic ulcer disease, she does have history of fatty liver.  GI has been consulted and plan for EGD today.  Continue IV Protonix GTT  2. Anemia- Secondary to above.  Hemoglobin is 7.5 this a.m.  Will transfuse for hemoglobin less than 7.  3. Hypertension-she is not on any antihypertensive medications.  We will continue to monitor.       CBG: Recent Labs  Lab 03/22/19 0744 03/22/19 1123  GLUCAP 230* 160*    CBC: Recent Labs  Lab 03/21/19 0757 03/22/19 0316 03/22/19 1255  WBC 6.9 7.7  --   HGB 10.3* 7.4* 7.5*  HCT 34.4* 24.2* 22.0*  MCV 87.8 89.0  --   PLT 94* 95*  --     Basic Metabolic Panel: Recent Labs  Lab 03/21/19 0757 03/21/19 1257 03/22/19 0316 03/22/19 1255  NA 137  --  137 142  K 4.2  --  4.2 4.0  CL 106  --  109  --   CO2 24  --  24  --   GLUCOSE 324*  --  286* 161*  BUN 13  --  22*  --   CREATININE 0.54  --  0.48  --   CALCIUM 8.6* 8.0* 7.8*  --   MG  --  1.7  --   --   PHOS  --  2.8  --   --      DVT prophylaxis: TED hose  Code Status: Full code  Family Communication: No family at bedside  Disposition Plan: likely home when medically ready for discharge     Consultants:  GI  Procedures:     Antibiotics:   Anti-infectives (From admission, onward)   None       Objective   Vitals:   03/22/19 1430 03/22/19 1445 03/22/19 1500 03/22/19 1504   BP: 136/62 (!) 153/67 (!) 126/58   Pulse: (!) 104 (!) 102 (!) 103 (!) 102  Resp: (!) 23 (!) 29 (!) 29 (!) 30  Temp: 98.8 F (37.1 C) 98.7 F (37.1 C)    TempSrc: Oral Oral    SpO2: 99% 100% 100% 100%  Weight:      Height:        Intake/Output Summary (Last 24 hours) at 03/22/2019 1541 Last data filed at 03/22/2019 1405 Gross per 24 hour  Intake 3765.6 ml  Output -  Net 3765.6 ml   Filed Weights   03/21/19 0744 03/22/19 0528  Weight: 111.6 kg 116 kg     Physical Examination:  General-appears in no acute distress Heart-S1-S2, regular, no murmur auscultated Lungs-clear to auscultation bilaterally, no wheezing or crackles auscultated Abdomen-soft, nontender, no organomegaly Extremities-no edema in the lower extremities Neuro-alert, oriented x3, no focal deficit noted     Data Reviewed: I have personally reviewed following labs and imaging studies   Recent Results (from the past 240 hour(s))  SARS Coronavirus 2 (  CEPHEID - Performed in [facility] hospital lab), Hosp Order     Status: None   Collection Time: 03/21/19 10:55 AM  Result Value Ref Range Status   SARS Coronavirus 2 NEGATIVE NEGATIVE Final    Comment: (NOTE) If result is NEGATIVE SARS-CoV-2 target nucleic acids are NOT DETECTED. The SARS-CoV-2 RNA is generally detectable in upper and lower  respiratory specimens during the acute phase of infection. The lowest  concentration of SARS-CoV-2 viral copies this assay can detect is 250  copies / mL. A negative result does not preclude SARS-CoV-2 infection  and should not be used as the sole basis for treatment or other  Angela Castillo management decisions.  A negative result may occur with  improper specimen collection / handling, submission of specimen other  than nasopharyngeal swab, presence of viral mutation(s) within the  areas targeted by this assay, and inadequate number of viral copies  (<250 copies / mL). A negative result must be combined with clinical   observations, Angela Castillo history, and epidemiological information. If result is POSITIVE SARS-CoV-2 target nucleic acids are DETECTED. The SARS-CoV-2 RNA is generally detectable in upper and lower  respiratory specimens dur ing the acute phase of infection.  Positive  results are indicative of active infection with SARS-CoV-2.  Clinical  correlation with Angela Castillo history and other diagnostic information is  necessary to determine Angela Castillo infection status.  Positive results do  not rule out bacterial infection or co-infection with other viruses. If result is PRESUMPTIVE POSTIVE SARS-CoV-2 nucleic acids MAY BE PRESENT.   A presumptive positive result was obtained on the submitted specimen  and confirmed on repeat testing.  While 2019 novel coronavirus  (SARS-CoV-2) nucleic acids may be present in the submitted sample  additional confirmatory testing may be necessary for epidemiological  and / or clinical management purposes  to differentiate between  SARS-CoV-2 and other Sarbecovirus currently known to infect humans.  If clinically indicated additional testing with an alternate test  methodology [phone]) is advised. The SARS-CoV-2 RNA is generally  detectable in upper and lower respiratory sp ecimens during the acute  phase of infection. The expected result is Negative. Fact Sheet for Patients:  StrictlyIdeas.no Fact Sheet for Healthcare Providers: BankingDealers.co.za This test is not yet approved or cleared by the [place] FDA and has been authorized for detection and/or diagnosis of SARS-CoV-2 by FDA under an Emergency Use Authorization (EUA).  This EUA will remain in effect (meaning this test can be used) for the duration of the COVID-19 declaration under Section 564(b)(1) of the Act, 21 U.S.C. section 360bbb-3(b)(1), unless the authorization is terminated or revoked sooner. Performed at [facility], [place]  [address], [place], [place] [address]      Liver Function Tests: Recent Labs  Lab 03/21/19 0757  AST 49*  ALT 50*  ALKPHOS 133*  BILITOT 0.7  PROT 7.3  ALBUMIN 3.3*   Recent Labs  Lab 03/21/19 0756  LIPASE 35   No results for input(s): AMMONIA in the last 168 hours.  Cardiac Enzymes: Recent Labs  Lab 03/22/19 1439  TROPONINI <0.03   BNP (last 3 results) Recent Labs    03/21/19 1257  BNP 29.1    ProBNP (last 3 results) No results for input(s): PROBNP in the last 8760 hours.    Studies: Dg Chest Port 1 View  Result Date: 03/22/2019 CLINICAL DATA:  Check endotracheal tube placement EXAM: PORTABLE CHEST 1 VIEW COMPARISON:  03/21/2019 FINDINGS: Cardiac shadow is mildly prominent but similar to that  seen on the previous day. Endotracheal tube is now seen within the right mainstem bronchus with new left basilar and left upper lobe atelectatic changes. This should be withdrawn approximately 3 cm. The right lung is clear. No bony abnormality is noted. IMPRESSION: Endotracheal tube within the right mainstem bronchus. This should be withdrawn approximately 3 cm. Patchy atelectatic changes are noted in the left lung new from the prior exam consistent with the right mainstem intubation. Critical Value/emergent results were called by telephone at the time of interpretation on 03/22/2019 at 2:53 pm to Wells Guiles, the Angela Castillo's nurse, who verbally acknowledged these results. Electronically Signed   By: Inez Catalina M.D.   On: 03/22/2019 14:54   Dg Chest Portable 1 View  Result Date: 03/21/2019 CLINICAL DATA:  Large volume hematemesis EXAM: PORTABLE CHEST 1 VIEW COMPARISON:  08/16/2015 FINDINGS: Lungs are clear.  No pleural effusion or pneumothorax. The heart is normal in size.  No evidence of pneumomediastinum. IMPRESSION: No evidence of acute cardiopulmonary disease. No evidence of pneumomediastinum. Electronically Signed   By: Julian Hy M.D.   On: 03/21/2019 10:37    Scheduled  Meds: . chlorhexidine gluconate (MEDLINE KIT)  15 mL Mouth Rinse BID  . docusate sodium  100 mg Oral BID  . ferrous sulfate  325 mg Oral BID WC  . insulin aspart  0-9 Units Subcutaneous Q4H  . mouth rinse  15 mL Mouth Rinse 10 times per day  . sodium chloride flush  3 mL Intravenous Q12H    Admission status: Inpatient: Based on patients clinical presentation and evaluation of above clinical data, I have made determination that Angela Castillo meets Inpatient criteria at this time.  Time spent: 20 min  [facility] Pager [phone]. If 7PM-7AM, please contact night-coverage at [email or web address], Office  [phone]  password [place]  03/22/2019, 3:41 PM  LOS: 1 day

## 2019-03-22 NOTE — Interval H&P Note (Signed)
History and Physical Interval Note:  03/22/2019 12:14 PM  Angela Castillo  has presented today for surgery, with the diagnosis of Hematemesis.  The various methods of treatment have been discussed with the patient and family. After consideration of risks, benefits and other options for treatment, the patient has consented to  Procedure(s): ESOPHAGOGASTRODUODENOSCOPY (EGD) (N/A) as a surgical intervention.  The patient's history has been reviewed, patient examined, no change in status, stable for surgery.  I have reviewed the patient's chart and labs.  Questions were answered to the patient's satisfaction.     [name]

## 2019-03-22 NOTE — Transfer of Care (Signed)
Immediate Anesthesia Transfer of Care Note  Patient: Angela Castillo  Procedure(s) Performed: ESOPHAGOGASTRODUODENOSCOPY (EGD) (N/A ) ESOPHAGEAL BANDING  Patient Location: ICU  Anesthesia Type:General  Level of Consciousness: sedated  Airway & Oxygen Therapy: Patient remains intubated per anesthesia plan  Post-op Assessment: Report given to RN and Post -op Vital signs reviewed and stable  Post vital signs: Reviewed and stable  Last Vitals:  Vitals Value Taken Time  BP    Temp    Pulse    Resp    SpO2      Last Pain:  Vitals:   03/22/19 1124  TempSrc:   PainSc: 0-No pain      Patients Stated Pain Goal: 2 (03/21/19 1605)  Complications: No apparent anesthesia complications

## 2019-03-22 NOTE — Progress Notes (Signed)
Clarified with Dr. Bosie Clos to NOT put in a NG or OG tube on patient due to varices. Also spoke with patients husband Casimiro Needle and gave him an update on patients condition.

## 2019-03-22 NOTE — Op Note (Signed)
[facility] Patient Name: Angela Castillo Procedure Date: 03/22/2019 MRN: [id number] Attending MD: [name] , MD Date of Birth: December 17, 1975 CSN: [id number] Age: 43 Admit Type: Inpatient Procedure:                Upper GI endoscopy Indications:              Suspected upper gastrointestinal bleeding,                            Hematemesis, Hematochezia Providers:                [name], MD, Cleda Daub, RN, Elspeth Cho Tech., Technician, Ladona Ridgel,                            Technician, Caryl Pina CRNA Referring MD:             hospital team Medicines:                Propofol per Anesthesia, Monitored Anesthesia Care Complications:            No immediate complications. Estimated Blood Loss:     Estimated blood loss: 20 mL. Procedure:                Pre-Anesthesia Assessment:                           - Prior to the procedure, a History and Physical                            was performed, and patient medications and                            allergies were reviewed. The patient's tolerance of                            previous anesthesia was also reviewed. The risks                            and benefits of the procedure and the sedation                            options and risks were discussed with the patient.                            All questions were answered, and informed consent                            was obtained. Prior Anticoagulants: The patient has                            taken no previous anticoagulant or antiplatelet  agents. ASA Grade Assessment: III - A patient with                            severe systemic disease. After reviewing the risks                            and benefits, the patient was deemed in                            satisfactory condition to undergo the procedure.                           After obtaining informed consent, the  endoscope was                            passed under direct vision. Throughout the                            procedure, the patient's blood pressure, pulse, and                            oxygen saturations were monitored continuously. The                            GIF-H190 ([id number]) Olympus gastroscope was                            introduced through the mouth, and advanced to the                            fundus of the stomach. The upper GI endoscopy was                            extremely difficult due to excessive bleeding.                            Successful completion of the procedure was aided by                            controlling the bleeding and performing the                            maneuvers documented (below) in this report. The                            patient tolerated the procedure. Scope In: Scope Out: Findings:      Red blood was found in the lower third of the esophagus.      Red blood was found in the entire examined stomach.      Three columns of spurting large (> 5 mm) varices were found in the lower       third of the esophagus,. Stigmata of recent bleeding were evident. Eight       bands were successfully placed with incomplete eradication of varices.  Bleeding had stopped at the end of the procedure.      Due to significant active blood coming from a varix upon initial       inspection of the esophagus the endoscope and large amount of bright red       blood seen in the stomach the endoscope was not advanced into the mid or       distal stomach or duodenum and the endoscope was removed and the bander       device was placed. Due to active bleeding and large amount of blood       present in the esophagus and stomach the patient was intubated to       protect her airway prior to inserting the endoscope for banding. Upon       reinsertion a large amount of blood and red clots obscured views of the       varices and the bleeding site could not be  pinpointed. Four bands were       placed distally and the clots were irrigated repeatedly. Due to poor       visualization some bands misfired and did not remain in place. A       therapeutic scope was inserted and clots were suctioned as much as       possible. The endoscope was reinserted with a new bander device and 4       additional bands were placed. No further active bleeding was seen at the       end of the procedure. Impression:               - Red blood in the lower third of the esophagus.                           - Red blood in the entire stomach.                           - Bleeding large (> 5 mm) esophageal varices.                            Incompletely eradicated. Banded.                           - No specimens collected. Moderate Sedation:      Not Applicable - Patient had care per Anesthesia. Recommendation:           - Observe patient's clinical course.                           - Keep on ventilator until tomorrow for airway                            protection. Octreotide bolus and drip. Protonix                            drip. NPO. 2 U PRBCs given during procedure. IVFs.                            Check Hepatitis panel. Procedure Code(s):        --- Professional ---  25525, Esophagoscopy, flexible, transoral; with                            band ligation of esophageal varices Diagnosis Code(s):        --- Professional ---                           K92.0, Hematemesis                           K92.1, Melena (includes Hematochezia)                           I85.01, Esophageal varices with bleeding                           K92.2, Gastrointestinal hemorrhage, unspecified                           K22.8, Other specified diseases of esophagus CPT copyright 2019 American Medical Association. All rights reserved. The codes documented in this report are preliminary and upon coder review may  be revised to meet current compliance  requirements. [name], MD 03/22/2019 1:53:23 PM This report has been signed electronically. Number of Addenda: 0

## 2019-03-22 NOTE — Progress Notes (Addendum)
Got a call from Dr. Michail Sermon, the patient is intubated and on mechanical ventilation.  She will be transferred to ICU. I called and discussed with Dr. Kara Mead, who will take over patient's care.

## 2019-03-23 ENCOUNTER — Inpatient Hospital Stay (HOSPITAL_COMMUNITY): Payer: BC Managed Care – PPO

## 2019-03-23 ENCOUNTER — Encounter (HOSPITAL_COMMUNITY): Payer: Self-pay | Admitting: *Deleted

## 2019-03-23 ENCOUNTER — Encounter (HOSPITAL_COMMUNITY)
Admission: EM | Disposition: A | Discharge status: Home / Self Care | Payer: Self-pay | Source: Home / Self Care | Attending: Internal Medicine

## 2019-03-23 ENCOUNTER — Encounter (HOSPITAL_COMMUNITY): Payer: Self-pay | Admitting: Certified Registered Nurse Anesthetist

## 2019-03-23 DIAGNOSIS — J9600 Acute respiratory failure, unspecified whether with hypoxia or hypercapnia: Secondary | ICD-10-CM

## 2019-03-23 DIAGNOSIS — K922 Gastrointestinal hemorrhage, unspecified: Secondary | ICD-10-CM

## 2019-03-23 DIAGNOSIS — R579 Shock, unspecified: Secondary | ICD-10-CM

## 2019-03-23 HISTORY — PX: ESOPHAGEAL BANDING: SHX5518

## 2019-03-23 HISTORY — PX: ESOPHAGOGASTRODUODENOSCOPY: SHX5428

## 2019-03-23 LAB — CBC
HCT: 25.1 % — ABNORMAL LOW (ref 36.0–46.0)
HCT: 24.5 % — ABNORMAL LOW (ref 36.0–46.0)
HCT: 23.8 % — ABNORMAL LOW (ref 36.0–46.0)
HCT: 23.3 % — ABNORMAL LOW (ref 36.0–46.0)
Hemoglobin: 7.7 g/dL — ABNORMAL LOW (ref 12.0–15.0)
Hemoglobin: 7.6 g/dL — ABNORMAL LOW (ref 12.0–15.0)
Hemoglobin: 7.5 g/dL — ABNORMAL LOW (ref 12.0–15.0)
Hemoglobin: 7.3 g/dL — ABNORMAL LOW (ref 12.0–15.0)
MCH: 28.1 pg (ref 26.0–34.0)
MCH: 28.7 pg (ref 26.0–34.0)
MCH: 28.8 pg (ref 26.0–34.0)
MCH: 28.7 pg (ref 26.0–34.0)
MCHC: 30.7 g/dL (ref 30.0–36.0)
MCHC: 31 g/dL (ref 30.0–36.0)
MCHC: 31.5 g/dL (ref 30.0–36.0)
MCHC: 31.3 g/dL (ref 30.0–36.0)
MCV: 91.6 fL (ref 80.0–100.0)
MCV: 92.5 fL (ref 80.0–100.0)
MCV: 91.5 fL (ref 80.0–100.0)
MCV: 91.7 fL (ref 80.0–100.0)
Platelets: 121 10*3/uL — ABNORMAL LOW (ref 150–400)
Platelets: 120 10*3/uL — ABNORMAL LOW (ref 150–400)
Platelets: 101 10*3/uL — ABNORMAL LOW (ref 150–400)
Platelets: 102 10*3/uL — ABNORMAL LOW (ref 150–400)
RBC: 2.74 MIL/uL — ABNORMAL LOW (ref 3.87–5.11)
RBC: 2.65 MIL/uL — ABNORMAL LOW (ref 3.87–5.11)
RBC: 2.6 MIL/uL — ABNORMAL LOW (ref 3.87–5.11)
RBC: 2.54 MIL/uL — ABNORMAL LOW (ref 3.87–5.11)
RDW: 15.2 % (ref 11.5–15.5)
RDW: 15.6 % — ABNORMAL HIGH (ref 11.5–15.5)
RDW: 15.1 % (ref 11.5–15.5)
RDW: 15.3 % (ref 11.5–15.5)
WBC: 12.7 10*3/uL — ABNORMAL HIGH (ref 4.0–10.5)
WBC: 15.8 10*3/uL — ABNORMAL HIGH (ref 4.0–10.5)
WBC: 10.2 10*3/uL (ref 4.0–10.5)
WBC: 11.7 10*3/uL — ABNORMAL HIGH (ref 4.0–10.5)
nRBC: 0 % (ref 0.0–0.2)
nRBC: 0 % (ref 0.0–0.2)
nRBC: 0 % (ref 0.0–0.2)
nRBC: 0.3 % — ABNORMAL HIGH (ref 0.0–0.2)

## 2019-03-23 LAB — BASIC METABOLIC PANEL
Anion gap: 8 (ref 5–15)
BUN: 16 mg/dL (ref 6–20)
CO2: 22 mmol/L (ref 22–32)
Calcium: 7.6 mg/dL — ABNORMAL LOW (ref 8.9–10.3)
Chloride: 112 mmol/L — ABNORMAL HIGH (ref 98–111)
Creatinine, Ser: 0.6 mg/dL (ref 0.44–1.00)
GFR calc Af Amer: >60 mL/min (ref >60)
GFR calc non Af Amer: >60 mL/min (ref >60)
Glucose, Bld: 218 mg/dL — ABNORMAL HIGH (ref 70–99)
Potassium: 3.9 mmol/L (ref 3.5–5.1)
Sodium: 142 mmol/L (ref 135–145)

## 2019-03-23 LAB — GLUCOSE, CAPILLARY
Glucose-Capillary: 205 mg/dL — ABNORMAL HIGH (ref 70–99)
Glucose-Capillary: 222 mg/dL — ABNORMAL HIGH (ref 70–99)
Glucose-Capillary: 235 mg/dL — ABNORMAL HIGH (ref 70–99)
Glucose-Capillary: 250 mg/dL — ABNORMAL HIGH (ref 70–99)
Glucose-Capillary: 225 mg/dL — ABNORMAL HIGH (ref 70–99)

## 2019-03-23 LAB — HEMOGLOBIN AND HEMATOCRIT, BLOOD
HCT: 25.7 % — ABNORMAL LOW (ref 36.0–46.0)
Hemoglobin: 8.1 g/dL — ABNORMAL LOW (ref 12.0–15.0)

## 2019-03-23 LAB — PREPARE RBC (CROSSMATCH)

## 2019-03-23 IMAGING — DX PORTABLE CHEST - 1 VIEW
1 series · 1 of 1 positions shown · non-contrast
Comparison: Yesterday

CLINICAL DATA: Respiratory insufficiency

EXAM:
PORTABLE CHEST 1 VIEW

[chest ap]
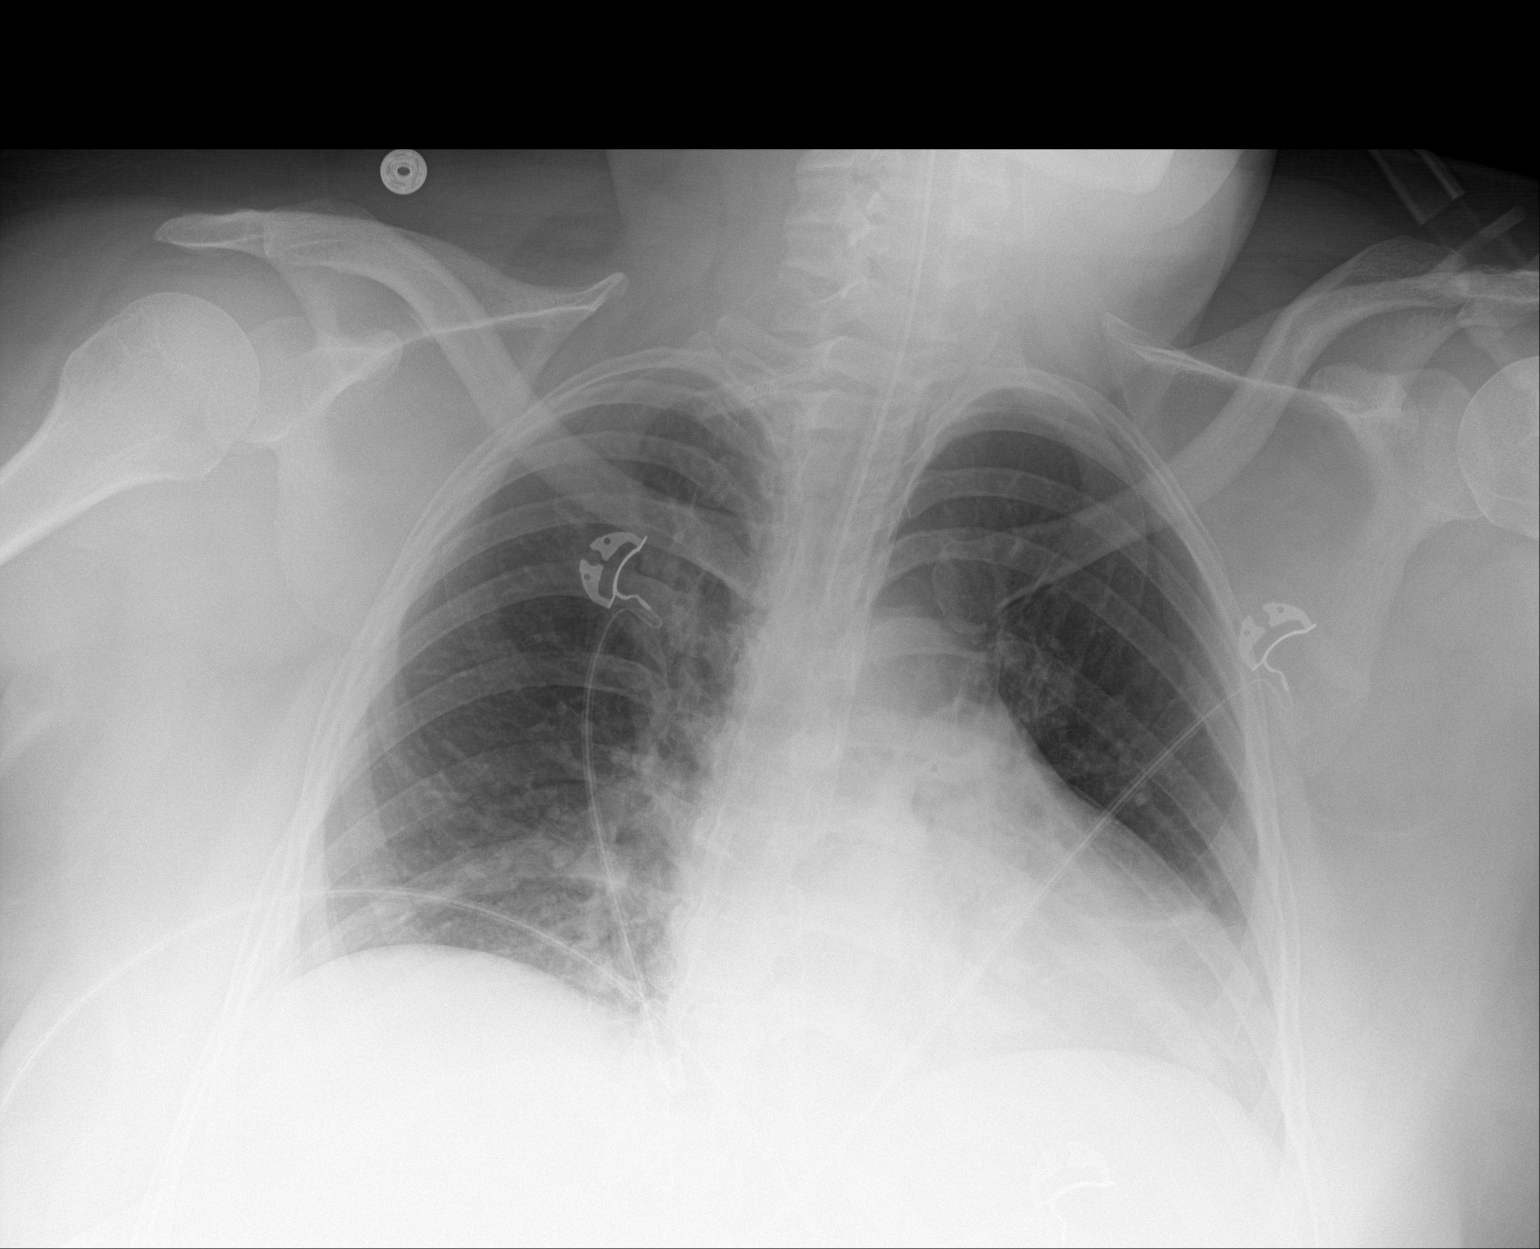

[1 of 1 positions shown; findings below may reference images not displayed]

FINDINGS: Endotracheal tube tip at the clavicular heads.

Cardiomegaly. Improved aeration with decreased streaky opacities
asymmetric to the left. No Kerley lines, effusion, or pneumothorax.
IMPRESSION: 1. Bilateral atelectasis or pneumonia.
2. Improved left upper lobe inflation after endotracheal tube
repositioning.

## 2019-03-23 IMAGING — DX PORTABLE CHEST - 1 VIEW
1 series · 1 of 1 positions shown · non-contrast
Comparison: [DATE].

CLINICAL DATA: Central line placement.

EXAM:
PORTABLE CHEST 1 VIEW

[chest ap]
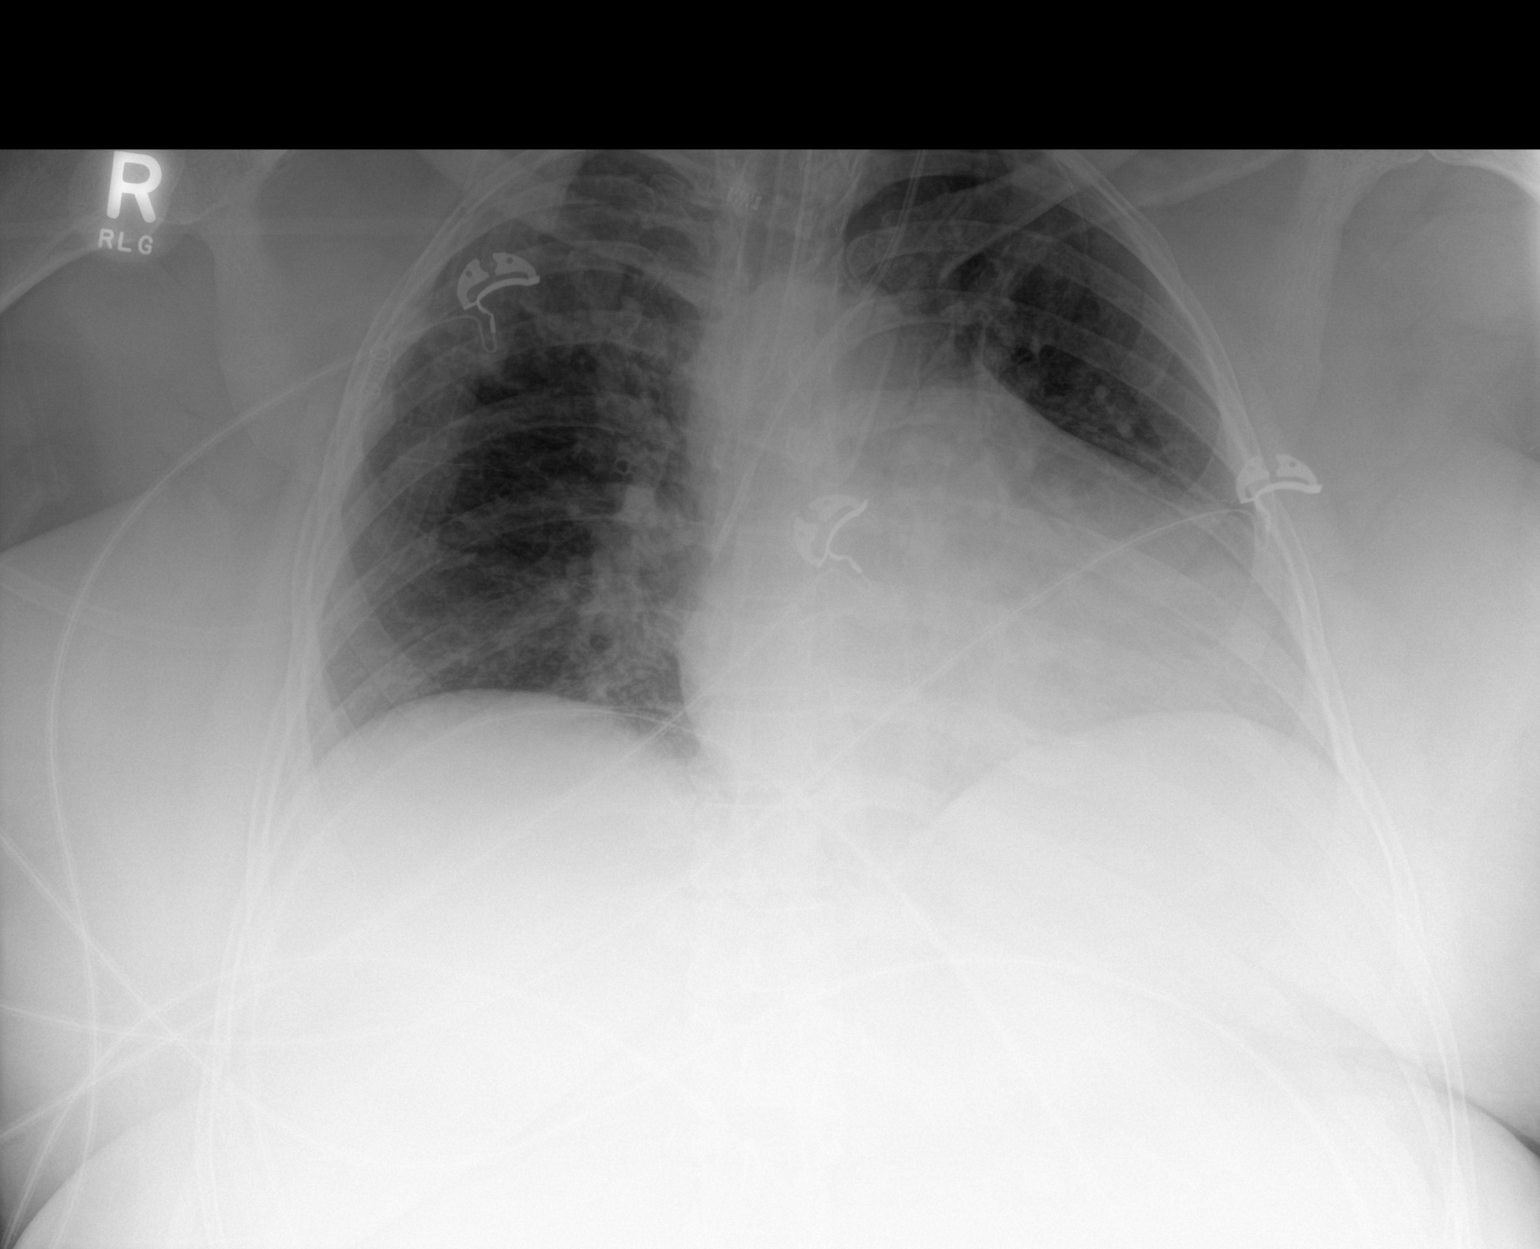

[1 of 1 positions shown; findings below may reference images not displayed]

FINDINGS: LEFT IJ catheter has been placed, and lies with its tip at the
cavoatrial junction. No pneumothorax. Cardiomegaly redemonstrated.
Bibasilar aeration is slightly improved. Unchanged ET tube.
IMPRESSION: LEFT IJ catheter tip cavoatrial junction. No pneumothorax.

## 2019-03-23 SURGERY — EGD (ESOPHAGOGASTRODUODENOSCOPY)
Anesthesia: Moderate Sedation

## 2019-03-23 MED ORDER — MIDAZOLAM 50MG/50ML (1MG/ML) PREMIX INFUSION
INTRAVENOUS | Status: AC
  Filled 2019-03-23: qty 50

## 2019-03-23 MED ORDER — CHLORHEXIDINE GLUCONATE CLOTH 2 % EX PADS
6.0000 | MEDICATED_PAD | Freq: Every day | CUTANEOUS | Status: DC
  Administered 2019-03-24 – 2019-03-25 (×3): 6 via TOPICAL

## 2019-03-23 MED ORDER — NOREPINEPHRINE 4 MG/250ML-% IV SOLN
0.0000 ug/min | INTRAVENOUS | Status: DC
  Filled 2019-03-23: qty 250

## 2019-03-23 MED ORDER — SODIUM CHLORIDE 0.9% IV SOLUTION: Freq: Once | INTRAVENOUS | Status: DC

## 2019-03-23 MED ORDER — SODIUM BICARBONATE 8.4 % IV SOLN
INTRAVENOUS | Status: AC
  Administered 2019-03-23: 12:00:00
  Filled 2019-03-23: qty 50

## 2019-03-23 MED ORDER — FENTANYL CITRATE (PF) 100 MCG/2ML IJ SOLN
INTRAMUSCULAR | Status: DC | PRN
  Administered 2019-03-23 (×2): 50 ug via INTRAVENOUS

## 2019-03-23 MED ORDER — SODIUM CHLORIDE 0.9% FLUSH
10.0000 mL | Freq: Two times a day (BID) | INTRAVENOUS | Status: DC
  Administered 2019-03-24 – 2019-03-29 (×10): 10 mL

## 2019-03-23 MED ORDER — DEXMEDETOMIDINE HCL IN NACL 400 MCG/100ML IV SOLN
0.0000 ug/kg/h | INTRAVENOUS | Status: DC
  Administered 2019-03-23 (×2): 1.2 ug/kg/h via INTRAVENOUS
  Administered 2019-03-24: 0.8 ug/kg/h via INTRAVENOUS
  Administered 2019-03-24 (×2): 1.2 ug/kg/h via INTRAVENOUS
  Filled 2019-03-23 (×7): qty 100

## 2019-03-23 MED ORDER — MIDAZOLAM HCL (PF) 5 MG/ML IJ SOLN: 1.0000 mg | INTRAMUSCULAR | Status: DC | PRN

## 2019-03-23 MED ORDER — DEXMEDETOMIDINE HCL IN NACL 200 MCG/50ML IV SOLN: 0.4000 ug/kg/h | INTRAVENOUS | Status: DC

## 2019-03-23 MED ORDER — MIDAZOLAM HCL (PF) 10 MG/2ML IJ SOLN
INTRAMUSCULAR | Status: DC | PRN
  Administered 2019-03-23 (×2): 2 mg via INTRAVENOUS

## 2019-03-23 MED ORDER — DEXMEDETOMIDINE HCL IN NACL 200 MCG/50ML IV SOLN
0.0000 ug/kg/h | INTRAVENOUS | Status: DC
  Administered 2019-03-23 (×2): 1 ug/kg/h via INTRAVENOUS
  Administered 2019-03-23: 1.2 ug/kg/h via INTRAVENOUS
  Administered 2019-03-23: 0.4 ug/kg/h via INTRAVENOUS
  Administered 2019-03-23: 14:00:00 1.2 ug/kg/h via INTRAVENOUS
  Filled 2019-03-23 (×5): qty 50

## 2019-03-23 MED ORDER — FENTANYL 2500MCG IN NS 250ML (10MCG/ML) PREMIX INFUSION
0.0000 ug/h | INTRAVENOUS | Status: DC
  Administered 2019-03-23: 17:00:00 25 ug/h via INTRAVENOUS
  Filled 2019-03-23: qty 250

## 2019-03-23 MED ORDER — MIDAZOLAM HCL 2 MG/2ML IJ SOLN
1.0000 mg | INTRAMUSCULAR | Status: DC | PRN
  Administered 2019-03-23 (×2): 2 mg via INTRAVENOUS
  Filled 2019-03-23: qty 2

## 2019-03-23 MED ORDER — FENTANYL CITRATE (PF) 100 MCG/2ML IJ SOLN
50.0000 ug | INTRAMUSCULAR | Status: DC | PRN
  Administered 2019-03-23: 100 ug via INTRAVENOUS
  Administered 2019-03-23: 13:00:00 50 ug via INTRAVENOUS
  Filled 2019-03-23 (×2): qty 2

## 2019-03-23 MED ORDER — NOREPINEPHRINE BITARTRATE 1 MG/ML IV SOLN
0.0000 ug/min | INTRAVENOUS | Status: DC
  Filled 2019-03-23: qty 4

## 2019-03-23 MED ORDER — SODIUM BICARBONATE 8.4 % IV SOLN: 100.0000 meq | Freq: Once | INTRAVENOUS | Status: AC

## 2019-03-23 MED ORDER — FENTANYL CITRATE (PF) 100 MCG/2ML IJ SOLN
INTRAMUSCULAR | Status: AC
  Filled 2019-03-23: qty 2

## 2019-03-23 MED ORDER — PROPOFOL 1000 MG/100ML IV EMUL
INTRAVENOUS | Status: AC
  Filled 2019-03-23: qty 100

## 2019-03-23 MED ORDER — MIDAZOLAM HCL 2 MG/2ML IJ SOLN
INTRAMUSCULAR | Status: AC
  Filled 2019-03-23: qty 2

## 2019-03-23 MED ORDER — SODIUM CHLORIDE 0.9% FLUSH: 10.0000 mL | INTRAVENOUS | Status: DC | PRN

## 2019-03-23 MED ORDER — MIDAZOLAM HCL (PF) 5 MG/ML IJ SOLN
INTRAMUSCULAR | Status: AC
  Filled 2019-03-23: qty 2

## 2019-03-23 MED ORDER — ROCURONIUM BROMIDE 50 MG/5ML IV SOLN
100.0000 mg | Freq: Once | INTRAVENOUS | Status: DC
  Filled 2019-03-23: qty 10

## 2019-03-23 NOTE — Progress Notes (Signed)
eLink Physician-Brief Progress Note Patient Name: Angela Castillo DOB: 07/18/1976 MRN: [id number]   Date of Service  03/23/2019  HPI/Events of Note  Fever to 102.3 F - Request for Tylenol. AST and ALT both elevated, therefore, will avoid Tylenol.   eICU Interventions  Will order: 1. Cooling blanket PRN.     Intervention Category Major Interventions: Infection - evaluation and management  Angela Castillo 03/23/2019, 11:42 PM

## 2019-03-23 NOTE — Interval H&P Note (Signed)
History and Physical Interval Note:  03/23/2019 11:30 AM  Angela Castillo  has presented today for surgery, with the diagnosis of GIB.  The various methods of treatment have been discussed with the patient and family. After consideration of risks, benefits and other options for treatment, the patient has consented to  Procedure(s): ESOPHAGOGASTRODUODENOSCOPY (EGD) (N/A) as a surgical intervention.  The patient's history has been reviewed, patient examined, no change in status, stable for surgery.  I have reviewed the patient's chart and labs.  Questions were answered to the patient's satisfaction.     [name]

## 2019-03-23 NOTE — Progress Notes (Addendum)
Called to place emergent central venous access.  Patient vomited bright red blood, hypotensive, diaphoretic.  Concern for re-bleed.  GI called emergently to bedside.  Neosynephrine restarted.  Blood ordered emergently.  Will continue to monitor.    Husband called to update on status change, reviewed emergent placement of central line and repeat EGD with husband.  He agreed to plan of care. Will call him post procedure for update.    Canary Brim, NP-C [facility] Pgr: [phone] or if no answer [phone] 03/23/2019, 11:23 AM

## 2019-03-23 NOTE — Progress Notes (Signed)
Patient vomited 50cc's of bright red blood around 0615. Reported to E-link. Will continue to monitor.

## 2019-03-23 NOTE — Consult Note (Signed)
NAME:  Angela Castillo, MRN:  [id number], DOB:  Oct 12, 1976, LOS: 2 ADMISSION DATE:  03/21/2019, CONSULTATION DATE:  03/22/19 REFERRING MD:  Dr. Sharl Ma, CHIEF COMPLAINT:  GIB    Brief History   43 y/o F admitted 5/17 with hematemesis & hematochezia.  Underwent EGD 5/18 with banding of large esophageal varices. Received 2 units PRBC (correction to prior note). To ICU post procedure on mechanical ventilation.    Past Medical History  GERD HTN Elevated LFT's  Anemia  ETOH - reported rare use  Significant Hospital Events   5/17  Admit  5/18  EGD, tx to ICU   Consults:  PCCM   Procedures:  ETT 5/18 >>   Significant Diagnostic Tests:  EGD 5/18 >> Red blood in the lower third of the esophagus. Red blood in the entire stomach. Bleeding large (> 5 mm) esophageal varices. Incompletely eradicated. Banded.  Micro Data:  COVID 5/17 >> negative   Antimicrobials:    Interim history/subjective:  RN reports pt on/off neosynephrine.  Remains on propofol.  Precedex never started.  Tmax 99.8.  Hgb 7.7. 4.8L positive since admit. Episode of bleeding this am (0600~) with 50-100 ml bright red blood.   Objective   Blood pressure (!) 109/51, pulse (!) 103, temperature 99.8 F (37.7 C), temperature source Oral, resp. rate (!) 21, height 5\' 3"  (1.6 m), weight 113.9 kg, last menstrual period 02/16/2019, SpO2 98 %.    Vent Mode: PRVC FiO2 (%):  [30 %-100 %] 30 % Set Rate:  [16 bmp] 16 bmp Vt Set:  [420 mL] 420 mL PEEP:  [5 cmH20] 5 cmH20 Plateau Pressure:  [16 cmH20-25 cmH20] 18 cmH20   Intake/Output Summary (Last 24 hours) at 03/23/2019 0959 Last data filed at 03/23/2019 6270 Gross per 24 hour  Intake 3147.2 ml  Output 1700 ml  Net 1447.2 ml   Filed Weights   03/21/19 0744 03/22/19 0528 03/23/19 0325  Weight: 111.6 kg 116 kg 113.9 kg    Examination: General: obese adult female lying in bed in NAD, critically ill appearing  HEENT: MM pink/moist, ETT Neuro: sedate  CV: s1s2 rrr, no  m/r/g PULM: even/non-labored, lungs bilaterally clear  GI: soft, bsx4 active  Extremities: warm/dry, no edema  Skin: no rashes or lesions  Resolved Hospital Problem list     Assessment & Plan:   Acute GIB  -large esophageal varices noted on EGD  Hematemesis / Hematochezia  Fatty liver ? NASH P: Appreciate GI input  Will await GI decision regarding return look before extubation  Continue PPI, octreotide gtt's  Follow up H/H at 11 am   Acute Respiratory Insufficiency  -in setting of hematemesis, intubated for airway protection  P: PRVC 8 cc/kg  Wean PEEP / FiO2 for sats > 90% Follow intermittent CXR  RASS goal 0 to -1  Change propofol to precedex with PRN fentanyl   Hypotension -related to blood loss and propofol P: Neo-synephrine as needed for MAP > 65  Acute Blood Loss Anemia  -negative troponin, EKG P: Trend CBC  Transfuse per ICU guidelines   Hyperglycemia  P: SSI Q4, sensitive scale    HTN  P: Hold home cozaar   At Risk AKI  P: Trend BMP / urinary output Replace electrolytes as indicated Avoid nephrotoxic agents, ensure adequate renal perfusion   Best practice:  Diet: NPO  Pain/Anxiety/Delirium protocol (if indicated): ordered  VAP protocol (if indicated): in place  DVT prophylaxis: SCD's  GI prophylaxis: PPI gtt  Glucose control: n/a  Mobility: bedrest  Code Status: Full Code  Family Communication: Husband updated on plan of care 5/19.    Disposition: ICU  Labs   CBC: Recent Labs  Lab 03/21/19 0757 03/22/19 0316 03/22/19 1255 03/22/19 1741 03/22/19 2222 03/23/19 0521  WBC 6.9 7.7  --  15.2* 19.5* 12.7*  HGB 10.3* 7.4* 7.5* 9.2* 8.9* 7.7*  HCT 34.4* 24.2* 22.0* 29.4* 28.1* 25.1*  MCV 87.8 89.0  --  91.0 89.8 91.6  PLT 94* 95*  --  137* 140* 121*    Basic Metabolic Panel: Recent Labs  Lab 03/21/19 0757 03/21/19 1257 03/22/19 0316 03/22/19 1255 03/22/19 1741 03/23/19 0521  NA 137  --  137 142 137 142  K 4.2  --  4.2 4.0  4.4 3.9  CL 106  --  109  --  110 112*  CO2 24  --  24  --  24 22  GLUCOSE 324*  --  286* 161* 219* 218*  BUN 13  --  22*  --  17 16  CREATININE 0.54  --  0.48  --  0.57 0.60  CALCIUM 8.6* 8.0* 7.8*  --  7.5* 7.6*  MG  --  1.7  --   --   --   --   PHOS  --  2.8  --   --   --   --    GFR: Estimated Creatinine Clearance: 111.4 mL/min (by C-G formula based on SCr of 0.6 mg/dL). Recent Labs  Lab 03/22/19 0316 03/22/19 1741 03/22/19 2222 03/23/19 0521  WBC 7.7 15.2* 19.5* 12.7*    Liver Function Tests: Recent Labs  Lab 03/21/19 0757  AST 49*  ALT 50*  ALKPHOS 133*  BILITOT 0.7  PROT 7.3  ALBUMIN 3.3*   Recent Labs  Lab 03/21/19 0756  LIPASE 35   No results for input(s): AMMONIA in the last 168 hours.  ABG    Component Value Date/Time   PHART 7.336 (L) 03/22/2019 1448   PCO2ART 44.8 03/22/2019 1448   PO2ART 201 (H) 03/22/2019 1448   HCO3 23.3 03/22/2019 1448   TCO2 25 08/16/2015 1742   ACIDBASEDEF 1.9 03/22/2019 1448   O2SAT 99.5 03/22/2019 1448     Coagulation Profile: Recent Labs  Lab 03/21/19 1257 03/22/19 0316  INR 1.3* 1.3*    Cardiac Enzymes: Recent Labs  Lab 03/22/19 1439  TROPONINI <0.03    HbA1C: Hemoglobin A1C  Date/Time Value Ref Range Status  06/21/2016 05:34 PM 5.2  Final  06/12/2015 05:49 PM 5.50  Final   Hgb A1c MFr Bld  Date/Time Value Ref Range Status  03/21/2019 12:57 PM 9.5 (H) 4.8 - 5.6 % Final    Comment:    (NOTE) Pre diabetes:          5.7%-6.4% Diabetes:              >6.4% Glycemic control for   <7.0% adults with diabetes     CBG: Recent Labs  Lab 03/22/19 1730 03/22/19 1920 03/22/19 2324 03/23/19 0330 03/23/19 0809  GLUCAP 215* 222* 198* 205* 222*    Critical care time: 35 minutes       Canary BrimBrandi Aquila Menzie, NP-C [place] [facility] Pgr: [phone] or if no answer [phone] 03/23/2019, 9:59 AM

## 2019-03-23 NOTE — H&P (View-Only) (Signed)
[facility] Gastroenterology Progress Note  Angela Castillo 43 y.o. 1975-12-06   Subjective: Vomited about 50 cc of bright red blood this morning and shortly after my evaluation called my Dr. Chase Castillo that patient is vomiting up bright red blood again. No BMs overnight. Intubated, sedated. On pressors. S/P 2 U PRBCs yesterday.  Objective: Vital signs: Vitals:   03/23/19 0800 03/23/19 0801  BP: (!) 109/51 (!) 109/51  Pulse: (!) 103 (!) 103  Resp: (!) 22 (!) 21  Temp: 99.8 F (37.7 C)   SpO2: 99% 98%    Physical Exam: Gen: sedated, intubated, obese  HEENT: anicteric sclera CV: RRR Chest: CTA B Abd: distended, nontender, +BS Ext: no edema  Lab Results: Recent Labs    03/21/19 1257  03/22/19 1741 03/23/19 0521  NA  --    < > 137 142  K  --    < > 4.4 3.9  CL  --    < > 110 112*  CO2  --    < > 24 22  GLUCOSE  --    < > 219* 218*  BUN  --    < > 17 16  CREATININE  --    < > 0.57 0.60  CALCIUM 8.0*   < > 7.5* 7.6*  MG 1.7  --   --   --   PHOS 2.8  --   --   --    < > = values in this interval not displayed.   Recent Labs    03/21/19 0757  AST 49*  ALT 50*  ALKPHOS 133*  BILITOT 0.7  PROT 7.3  ALBUMIN 3.3*   Recent Labs    03/22/19 2222 03/23/19 0521  WBC 19.5* 12.7*  HGB 8.9* 7.7*  HCT 28.1* 25.1*  MCV 89.8 91.6  PLT 140* 121*      Assessment/Plan: Variceal bleed - s/p banding X 8 yesterday who is now having hematemesis again. She had a large amount of blood clots in her esophagus and stomach so may be residual blood but will need bedside EGD now to further evaluate and see if any varices are actively bleeding. Hgb increase to 9.2 and 7.7 today. Remains on ventilator. Pressors. Continue Octreotide and Protonix drips.   Angela Castillo 03/23/2019, 10:45 AM  Questions please call [phone] ID: Angela Castillo, female   DOB: 08/07/1976, 43 y.o.   MRN: [id number]

## 2019-03-23 NOTE — Procedures (Signed)
Central Venous Catheter Insertion Procedure Note NIAYLA DEWAN [id number] Aug 08, 1976  Procedure: Insertion of Central Venous Catheter Indications: Assessment of intravascular volume, Drug and/or fluid administration and Frequent blood sampling  Procedure Details Consent: Unable to obtain consent because of emergent medical necessity.   Time Out: Verified patient identification, verified procedure, site/side was marked, verified correct patient position, special equipment/implants available, medications/allergies/relevent history reviewed, required imaging and test results available.  Performed  Maximum sterile technique was used including antiseptics, cap, gloves, gown, hand hygiene, mask and sheet. Skin prep: Chlorhexidine; local anesthetic administered A antimicrobial bonded/coated triple lumen catheter was placed in the left internal jugular vein using the Seldinger technique.  Evaluation Blood flow good Complications: No apparent complications Patient did tolerate procedure well. Chest X-ray ordered to verify placement.  CXR: pending.   Procedure performed under direct supervision of Dr. Marchelle Gearing and with ultrasound guidance for real time vessel cannulation.     Canary Brim, NP-C [place] [facility] Pgr: [phone] or if no answer [phone] 03/23/2019, 11:25 AM

## 2019-03-23 NOTE — Progress Notes (Signed)
Increaseing instability with bp/HR  Plan Sedate with diprivan, fent prn , versed prn Rocuronium x 1 for GI scope Add levophed if needed Bic 1 amp start Dr Michail Sermon to scope     SIGNATURE    Dr. Brand Males, M.D., F.C.C.P,  Pulmonary and Critical Care Medicine Staff Physician, [facility] Director - Interstitial Lung Disease  Program  Pulmonary [place] [facility] [place], [place], [address]  Pager: [phone], If no answer or between  15:00h - 7:00h: call [phone] Telephone: [phone]  11:45 AM 03/23/2019

## 2019-03-23 NOTE — Progress Notes (Signed)
Initial Nutrition Assessment  DOCUMENTATION CODES:   Morbid obesity  INTERVENTION:   If patient is to remain intubated for 24-48 hours, recommend initiation of nutrition support.  NUTRITION DIAGNOSIS:   Inadequate oral intake related to inability to eat as evidenced by estimated needs.  GOAL:   Provide needs based on ASPEN/SCCM guidelines  MONITOR:   Vent status, Labs, Weight trends, I & O's  REASON FOR ASSESSMENT:   Ventilator   ASSESSMENT:   43 y.o. female with medical history significant of extensive history of hypertension, chronic shoulder pain, back pain, headaches, GERD, Fronek anemia of iron deficiency admitted for hematemesis.  5/18: s/p EGD w/ banding- bleeding large esophageal varices found. Intubated following EGD for airway protection.  **RD working remotely**  Per GI note today, pt with continued vomiting of blood, will need bedside EGD. No NG or OGT was placed d/t varices. Pt reported no appetite changes or weight loss in MST screen. No weight loss noted in weight records.  If warranted and pt to remain intubated, recommend nutrition support.  Patient is currently intubated on ventilator support since 5/18. MV: 7.9 L/min Temp (24hrs), Avg:99.3 F (37.4 C), Min:98.7 F (37.1 C), Max:100 F (37.8 C)  Medications: IV Precedex, IV Octreotide Labs reviewed: CBGs: 222  NUTRITION - FOCUSED PHYSICAL EXAM:  Unable to perform per department requirements to work remotely.  Diet Order:   Diet Order            Diet NPO time specified  Diet effective now              EDUCATION NEEDS:   No education needs have been identified at this time  Skin:  Skin Assessment: Reviewed RN Assessment  Last BM:  5/18  Height:   Ht Readings from Last 1 Encounters:  03/22/19 5\' 3"  (1.6 m)    Weight:   Wt Readings from Last 1 Encounters:  03/23/19 113.9 kg    Ideal Body Weight:  52.3 kg  BMI:  Body mass index is 44.48 kg/m.  Estimated Nutritional  Needs:   Kcal:  6122-4497  Protein:  100-110g  Fluid:  1.5L/day  Tilda Franco, MS, RD, LDN [facility] Inpatient Clinical Dietitian Pager: [phone] After Hours Pager: [phone]

## 2019-03-23 NOTE — Progress Notes (Signed)
[facility] Gastroenterology Progress Note  Angela Castillo 43 y.o. [birth year]   Subjective: Vomited about 50 cc of bright red blood this morning and shortly after my evaluation called my Dr. Chase Castillo that patient is vomiting up bright red blood again. No BMs overnight. Intubated, sedated. On pressors. S/P 2 U PRBCs yesterday.  Objective: Vital signs: Vitals:   03/23/19 0800 03/23/19 0801  BP: (!) 109/51 (!) 109/51  Pulse: (!) 103 (!) 103  Resp: (!) 22 (!) 21  Temp: 99.8 F (37.7 C)   SpO2: 99% 98%    Physical Exam: Gen: sedated, intubated, obese  HEENT: anicteric sclera CV: RRR Chest: CTA B Abd: distended, nontender, +BS Ext: no edema  Lab Results: Recent Labs    03/21/19 1257  03/22/19 1741 03/23/19 0521  NA  --    < > 137 142  K  --    < > 4.4 3.9  CL  --    < > 110 112*  CO2  --    < > 24 22  GLUCOSE  --    < > 219* 218*  BUN  --    < > 17 16  CREATININE  --    < > 0.57 0.60  CALCIUM 8.0*   < > 7.5* 7.6*  MG 1.7  --   --   --   PHOS 2.8  --   --   --    < > = values in this interval not displayed.   Recent Labs    03/21/19 0757  AST 49*  ALT 50*  ALKPHOS 133*  BILITOT 0.7  PROT 7.3  ALBUMIN 3.3*   Recent Labs    03/22/19 2222 03/23/19 0521  WBC 19.5* 12.7*  HGB 8.9* 7.7*  HCT 28.1* 25.1*  MCV 89.8 91.6  PLT 140* 121*      Assessment/Plan: Variceal bleed - s/p banding X 8 yesterday who is now having hematemesis again. She had a large amount of blood clots in her esophagus and stomach so may be residual blood but will need bedside EGD now to further evaluate and see if any varices are actively bleeding. Hgb increase to 9.2 and 7.7 today. Remains on ventilator. Pressors. Continue Octreotide and Protonix drips.   Angela Castillo 03/23/2019, 10:45 AM  Questions please call [phone] ID: Angela Castillo, female   DOB: 12-09-1975, 43 y.o.   MRN: [id number]

## 2019-03-23 NOTE — Brief Op Note (Signed)
Repeat EGD due to hematemesis and 6 additional bands placed on the esophageal varices. One large protuberant nipple sign noted that was not actively bleeding that was banded. Two areas adjacent to it started bleeding after banding but bleeding was controlled by the placed bands. Keep on ventilator another 24 hours. Blood transfusions as needed. Continue Octreotide and Protonix drip. Supportive care. Updated husband by phone.

## 2019-03-23 NOTE — Op Note (Signed)
[facility] Patient Name: Angela Castillo Procedure Date: 03/23/2019 MRN: [id number] Attending MD: [name] , MD Date of Birth: 02-May-1976 CSN: [id number] Age: 43 Admit Type: Inpatient Procedure:                Upper GI endoscopy Indications:              Active gastrointestinal bleeding, Hematemesis Providers:                [name], MD, Burtis Junes, RN, Cherylynn Ridges, Technician Referring MD:             hospital team Medicines:                Fentanyl 100 micrograms IV, Midazolam 4 mg IV Complications:            No immediate complications. Estimated Blood Loss:     Estimated blood loss was minimal. Procedure:                Pre-Anesthesia Assessment:                           - Prior to the procedure, a History and Physical                            was performed, and patient medications and                            allergies were reviewed. The patient's tolerance of                            previous anesthesia was also reviewed. The risks                            and benefits of the procedure and the sedation                            options and risks were discussed with the patient.                            All questions were answered, and informed consent                            was obtained. Prior Anticoagulants: The patient has                            taken no previous anticoagulant or antiplatelet                            agents. ASA Grade Assessment: IV - A patient with                            severe systemic disease that is a constant threat  to life. After reviewing the risks and benefits,                            the patient was deemed in satisfactory condition to                            undergo the procedure.                           After obtaining informed consent, the endoscope was                            passed under direct vision. Throughout  the                            procedure, the patient's blood pressure, pulse, and                            oxygen saturations were monitored continuously. The                            [id number]-[id number] ([id number]) Olympus endoscope was introduced                            through the mouth, and advanced to the second part                            of duodenum. The upper GI endoscopy was extremely                            difficult due to excessive bleeding. Successful                            completion of the procedure was aided by                            controlling the bleeding and performing the                            maneuvers documented (below) in this report. The                            patient tolerated the procedure. Scope In: Scope Out: Findings:      Three columns of oozing large (> 5 mm) varices were found in the lower       third of the esophagus,. Stigmata of recent bleeding were evident and       red wale signs were present. Six bands were successfully placed with       incomplete eradication of varices. Bleeding had stopped at the end of       the procedure.      Due to fear of dislodging previouslyplaced bands endoscope was not       advanced distal to lowest placed bands. A large protuberant nipple sign       was noted and  it was banded. Impression:               - Bleeding large (> 5 mm) esophageal varices.                            Incompletely eradicated. Banded.                           - No specimens collected. Moderate Sedation:      See the other procedure note for documentation of moderate sedation with       intraservice time. Recommendation:           - Observe patient's clinical course.                           - If rebleeding occurs then will need TIPS                            placement. Procedure Code(s):        --- Professional ---                           [phone], Esophagogastroduodenoscopy, flexible,                             transoral; with band ligation of esophageal/gastric                            varices Diagnosis Code(s):        --- Professional ---                           K92.0, Hematemesis                           I85.01, Esophageal varices with bleeding                           K92.2, Gastrointestinal hemorrhage, unspecified CPT copyright 2019 American Medical Association. All rights reserved. The codes documented in this report are preliminary and upon coder review may  be revised to meet current compliance requirements. [name], MD 03/23/2019 12:30:05 PM This report has been signed electronically. Number of Addenda: 0

## 2019-03-23 NOTE — Progress Notes (Addendum)
Inpatient Diabetes Program Recommendations  AACE/ADA: New Consensus Statement on Inpatient Glycemic Control (2015)  Target Ranges:  Prepandial:   less than 140 mg/dL      Peak postprandial:   less than 180 mg/dL (1-2 hours)      Critically ill patients:  140 - 180 mg/dL   Results for MARGUITA, CHEVRIER (MRN [id number]) as of 03/23/2019 07:38  Ref. Range 03/22/2019 11:23 03/22/2019 17:30 03/22/2019 19:20 03/22/2019 23:24 03/23/2019 03:30  Glucose-Capillary Latest Ref Range: 70 - 99 mg/dL 315 (H) 176 (H)  3 units NOVOLOG  222 (H)  3 units NOVOLOG  198 (H)  2 units NOVOLOG  205 (H)  3 units NOVOLOG     Admit with: Hematemesis/ GIB  History: HTN  Current Insulin Orders: Novolog Sensitive Correction Scale/ SSI (0-9 units) Q4 hours  No PCP--On husband's insurance.     Underwent EGD yesterday.  Required Intubation for airway protection post-EGD.  Remains on Vent this AM.     MD- Please consider increasing Novolog SSi to Moderate scale (0-15 units) Q4 hours   Note that pt's Hemoglobin A1c was 9.5% on admission.  Not sure about accuracy as she was admitted with GIB and Hemoglobin of 7.4 g/dl.  Is this a new diagnosis of Diabetes?  Should pt follow up with PCP for additional glucose/ A1c testing after discharge?      --Will follow patient during hospitalization--  Ambrose Finland RN, MSN, CDE Diabetes Coordinator Inpatient Glycemic Control Team Team Pager: [phone] (8a-5p)

## 2019-03-24 ENCOUNTER — Inpatient Hospital Stay (HOSPITAL_COMMUNITY): Payer: BC Managed Care – PPO

## 2019-03-24 DIAGNOSIS — R579 Shock, unspecified: Secondary | ICD-10-CM

## 2019-03-24 DIAGNOSIS — R0689 Other abnormalities of breathing: Secondary | ICD-10-CM

## 2019-03-24 DIAGNOSIS — J9690 Respiratory failure, unspecified, unspecified whether with hypoxia or hypercapnia: Secondary | ICD-10-CM

## 2019-03-24 LAB — CBC WITH DIFFERENTIAL/PLATELET
Abs Immature Granulocytes: 0.03 10*3/uL (ref 0.00–0.07)
Basophils Absolute: 0 10*3/uL (ref 0.0–0.1)
Basophils Relative: 0 %
Eosinophils Absolute: 0 10*3/uL (ref 0.0–0.5)
Eosinophils Relative: 0 %
HCT: 22.5 % — ABNORMAL LOW (ref 36.0–46.0)
Hemoglobin: 7 g/dL — ABNORMAL LOW (ref 12.0–15.0)
Immature Granulocytes: 0 %
Lymphocytes Relative: 13 %
Lymphs Abs: 1.2 10*3/uL (ref 0.7–4.0)
MCH: 29.2 pg (ref 26.0–34.0)
MCHC: 31.1 g/dL (ref 30.0–36.0)
MCV: 93.8 fL (ref 80.0–100.0)
Monocytes Absolute: 0.6 10*3/uL (ref 0.1–1.0)
Monocytes Relative: 7 %
Neutro Abs: 7.4 10*3/uL (ref 1.7–7.7)
Neutrophils Relative %: 80 %
Platelets: 89 10*3/uL — ABNORMAL LOW (ref 150–400)
RBC: 2.4 MIL/uL — ABNORMAL LOW (ref 3.87–5.11)
RDW: 15.8 % — ABNORMAL HIGH (ref 11.5–15.5)
WBC: 9.3 10*3/uL (ref 4.0–10.5)
nRBC: 0.2 % (ref 0.0–0.2)

## 2019-03-24 LAB — HEPATIC FUNCTION PANEL
ALT: 93 U/L — ABNORMAL HIGH (ref 0–44)
AST: 78 U/L — ABNORMAL HIGH (ref 15–41)
Albumin: 2.6 g/dL — ABNORMAL LOW (ref 3.5–5.0)
Alkaline Phosphatase: 68 U/L (ref 38–126)
Bilirubin, Direct: 0.2 mg/dL (ref 0.0–0.2)
Indirect Bilirubin: 0.7 mg/dL (ref 0.3–0.9)
Total Bilirubin: 0.9 mg/dL (ref 0.3–1.2)
Total Protein: 5.8 g/dL — ABNORMAL LOW (ref 6.5–8.1)

## 2019-03-24 LAB — BLOOD GAS, ARTERIAL
Acid-Base Excess: 0.8 mmol/L (ref 0.0–2.0)
Bicarbonate: 25.1 mmol/L (ref 20.0–28.0)
Drawn by: 270211
FIO2: 0.3
MECHVT: 420 mL
O2 Saturation: 90.2 %
PEEP: 5 cmH2O
Patient temperature: 98.6
RATE: 16 resp/min
pCO2 arterial: 41.3 mmHg (ref 32.0–48.0)
pH, Arterial: 7.401 (ref 7.350–7.450)
pO2, Arterial: 60.7 mmHg — ABNORMAL LOW (ref 83.0–108.0)

## 2019-03-24 LAB — BASIC METABOLIC PANEL
Anion gap: 3 — ABNORMAL LOW (ref 5–15)
BUN: 18 mg/dL (ref 6–20)
CO2: 24 mmol/L (ref 22–32)
Calcium: 7.5 mg/dL — ABNORMAL LOW (ref 8.9–10.3)
Chloride: 117 mmol/L — ABNORMAL HIGH (ref 98–111)
Creatinine, Ser: 0.53 mg/dL (ref 0.44–1.00)
GFR calc Af Amer: >60 mL/min (ref >60)
GFR calc non Af Amer: >60 mL/min (ref >60)
Glucose, Bld: 228 mg/dL — ABNORMAL HIGH (ref 70–99)
Potassium: 3.7 mmol/L (ref 3.5–5.1)
Sodium: 144 mmol/L (ref 135–145)

## 2019-03-24 LAB — CBC
HCT: 23.2 % — ABNORMAL LOW (ref 36.0–46.0)
HCT: 24 % — ABNORMAL LOW (ref 36.0–46.0)
Hemoglobin: 7.2 g/dL — ABNORMAL LOW (ref 12.0–15.0)
Hemoglobin: 7.3 g/dL — ABNORMAL LOW (ref 12.0–15.0)
MCH: 28.9 pg (ref 26.0–34.0)
MCH: 28.9 pg (ref 26.0–34.0)
MCHC: 31 g/dL (ref 30.0–36.0)
MCHC: 30.4 g/dL (ref 30.0–36.0)
MCV: 93.2 fL (ref 80.0–100.0)
MCV: 94.9 fL (ref 80.0–100.0)
Platelets: 100 10*3/uL — ABNORMAL LOW (ref 150–400)
Platelets: 116 10*3/uL — ABNORMAL LOW (ref 150–400)
RBC: 2.49 MIL/uL — ABNORMAL LOW (ref 3.87–5.11)
RBC: 2.53 MIL/uL — ABNORMAL LOW (ref 3.87–5.11)
RDW: 15.6 % — ABNORMAL HIGH (ref 11.5–15.5)
RDW: 16.3 % — ABNORMAL HIGH (ref 11.5–15.5)
WBC: 12.5 10*3/uL — ABNORMAL HIGH (ref 4.0–10.5)
WBC: 12.5 10*3/uL — ABNORMAL HIGH (ref 4.0–10.5)
nRBC: 0 % (ref 0.0–0.2)
nRBC: 0 % (ref 0.0–0.2)

## 2019-03-24 LAB — GLUCOSE, CAPILLARY
Glucose-Capillary: 214 mg/dL — ABNORMAL HIGH (ref 70–99)
Glucose-Capillary: 205 mg/dL — ABNORMAL HIGH (ref 70–99)
Glucose-Capillary: 193 mg/dL — ABNORMAL HIGH (ref 70–99)
Glucose-Capillary: 228 mg/dL — ABNORMAL HIGH (ref 70–99)
Glucose-Capillary: 212 mg/dL — ABNORMAL HIGH (ref 70–99)
Glucose-Capillary: 185 mg/dL — ABNORMAL HIGH (ref 70–99)

## 2019-03-24 LAB — MAGNESIUM: Magnesium: 2 mg/dL (ref 1.7–2.4)

## 2019-03-24 LAB — HEPATITIS B SURFACE ANTIGEN: Hepatitis B Surface Ag: NEGATIVE

## 2019-03-24 LAB — TROPONIN I: Troponin I: <0.03 ng/mL (ref <0.03)

## 2019-03-24 IMAGING — DX PORTABLE CHEST - 1 VIEW
1 series · 1 of 1 positions shown · non-contrast
Comparison: [DATE]

CLINICAL DATA: Follow-up respiratory failure.

EXAM:
PORTABLE CHEST 1 VIEW

[chest ap]
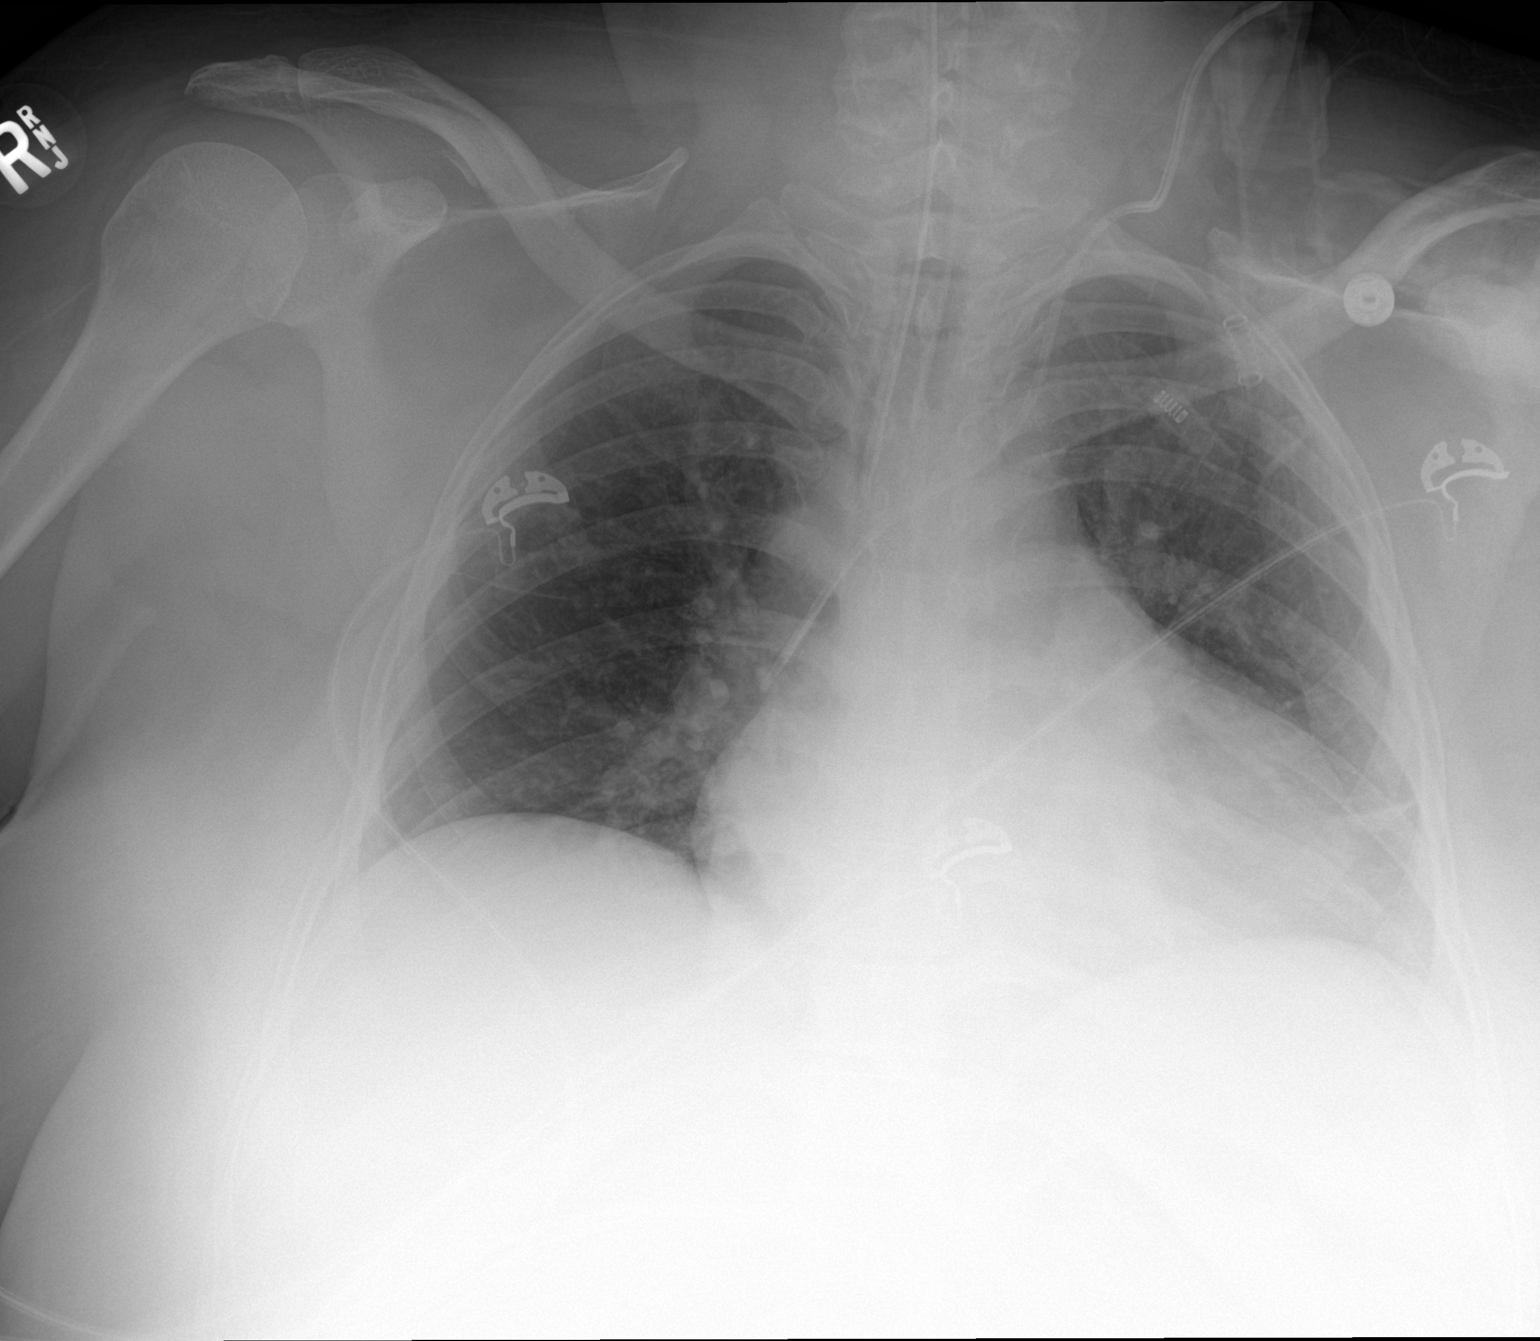

[1 of 1 positions shown; findings below may reference images not displayed]

FINDINGS: The heart size and mediastinal contours are stable. The heart size
is enlarged. Endotracheal tube is identified distal tip 2 cm from
carina unchanged compared prior exam. Left central venous line is
identified with distal tip in superior vena cava. Minimal
atelectasis of left lung base is identified. There is no focal
infiltrate, pulmonary edema, or pleural effusion. The visualized
skeletal structures are unremarkable.
IMPRESSION: Minimal atelectasis of left lung base. No focal pneumonia or
pulmonary edema.

Life supporting devices are unchanged.

## 2019-03-24 MED ORDER — ALUM & MAG HYDROXIDE-SIMETH 200-200-20 MG/5ML PO SUSP
30.0000 mL | Freq: Four times a day (QID) | ORAL | Status: DC | PRN
  Administered 2019-03-26 – 2019-03-28 (×6): 30 mL via ORAL
  Filled 2019-03-24 (×7): qty 30

## 2019-03-24 MED ORDER — HYDRALAZINE HCL 20 MG/ML IJ SOLN: 10.0000 mg | INTRAMUSCULAR | Status: DC | PRN

## 2019-03-24 MED ORDER — LACTULOSE 10 GM/15ML PO SOLN
10.0000 g | Freq: Three times a day (TID) | ORAL | Status: DC
  Administered 2019-03-24 – 2019-03-29 (×11): 10 g via ORAL
  Filled 2019-03-24 (×2): qty 15
  Filled 2019-03-24: qty 30
  Filled 2019-03-24 (×3): qty 15
  Filled 2019-03-24: qty 30
  Filled 2019-03-24: qty 15
  Filled 2019-03-24 (×5): qty 30

## 2019-03-24 MED ORDER — POTASSIUM CHLORIDE 10 MEQ/50ML IV SOLN
10.0000 meq | INTRAVENOUS | Status: AC
  Administered 2019-03-24 (×3): 10 meq via INTRAVENOUS
  Filled 2019-03-24 (×3): qty 50

## 2019-03-24 MED ORDER — FUROSEMIDE 10 MG/ML IJ SOLN
40.0000 mg | Freq: Once | INTRAMUSCULAR | Status: AC
  Administered 2019-03-24: 40 mg via INTRAVENOUS
  Filled 2019-03-24: qty 4

## 2019-03-24 MED ORDER — SODIUM CHLORIDE 0.9 % IV SOLN
8.0000 mg/h | INTRAVENOUS | Status: AC
  Administered 2019-03-24 – 2019-03-27 (×7): 8 mg/h via INTRAVENOUS
  Filled 2019-03-24 (×12): qty 80

## 2019-03-24 MED ORDER — SODIUM CHLORIDE 0.9 % IV SOLN
3.0000 g | Freq: Four times a day (QID) | INTRAVENOUS | Status: DC
  Administered 2019-03-24 – 2019-03-27 (×13): 3 g via INTRAVENOUS
  Filled 2019-03-24 (×15): qty 3

## 2019-03-24 NOTE — Progress Notes (Signed)
Pharmacy Antibiotic Note  Angela Castillo is a 43 y.o. female admitted on 03/21/2019 withhematemesis & hematochezia. Marland Kitchen  Pharmacy has been consulted for Unasyn dosing for aspiration PNA.  Plan: Unasyn 3 gm IV q6h Pharmacy to sign off  Height: 5\' 3"  (160 cm) Weight: 251 lb 1.7 oz (113.9 kg) IBW/kg (Calculated) : 52.4  Temp (24hrs), Avg:100 F (37.8 C), Min:98.1 F (36.7 C), Max:102.1 F (38.9 C)  Recent Labs  Lab 03/21/19 0757 03/22/19 0316 03/22/19 1741  03/23/19 0521 03/23/19 1052 03/23/19 1717 03/23/19 2225 03/24/19 0414  WBC 6.9 7.7 15.2*   < > 12.7* 15.8* 10.2 11.7* 12.5*  CREATININE 0.54 0.48 0.57  --  0.60  --   --   --  0.53   < > = values in this interval not displayed.    Estimated Creatinine Clearance: 111.4 mL/min (by C-G formula based on SCr of 0.53 mg/dL).    No Known Allergies  Thank you for allowing pharmacy to be a part of this patient's care.  Herby Abraham, Pharm.D 03/24/2019 8:36 AM

## 2019-03-24 NOTE — Procedures (Signed)
Extubation Procedure Note  Patient Details:   Name: Angela Castillo DOB: 1975/11/11 MRN: [id number]   Airway Documentation:    Vent end date: 03/24/19 Vent end time: 1410   Evaluation  O2 sats: stable throughout Complications: No apparent complications Patient did tolerate procedure well. Bilateral Breath Sounds: Diminished, Rhonchi   Yes  Renold Genta 03/24/2019, 2:13 PM

## 2019-03-24 NOTE — Evaluation (Signed)
SLP Cancellation Note  Patient Details Name: Angela Castillo MRN: [id number] DOB: 27-Dec-1975   Cancelled treatment:       Reason Eval/Treat Not Completed: Medical issues which prohibited therapy(pt just extubated and RN reports she had 14 bands placed, will follow up next date 03/25/2019)   Chales Abrahams 03/24/2019, 3:12 PM Donavan Burnet, MS [facility] SLP Acute Rehab Services Pager [phone] Office [phone]

## 2019-03-24 NOTE — Progress Notes (Addendum)
Verbal orders received from Dr. Marchelle Gearing to resume clear liquid diet this evening, 20:00 if able appropriate and begin order for lactulose 10mg  TID. Call placed to GI Dr.Schooler to make aware of change in status for patient.   Per Dr. Bosie Clos, to hold NPO except medications until morning, okay to give lactulose and sips of water with it. Will continue to monitor.

## 2019-03-24 NOTE — Progress Notes (Signed)
Pt has been making sounds from her ET Tube(ex. Burping and a air sound) Doctor and NP was notified. The doctor said we can wean the Pt. RT placed the Pt on 5/5 PS. Pt threw up old blood with a small amount of red blood. RT placed Pt back on full support. RT will continue to monitor.

## 2019-03-24 NOTE — Progress Notes (Signed)
eLink Physician-Brief Progress Note Patient Name: Angela Castillo DOB: 1976-09-12 MRN: [id number]   Date of Service  03/24/2019  HPI/Events of Note  Variceal Bleed - Patient s/p EGD with banding of varices x 8. Request to renew Protonix IV infusion order.  eICU Interventions  Will renew Protonix IV infusion order.     Intervention Category Major Interventions: Other:  Lysle Dingwall 03/24/2019, 8:53 PM

## 2019-03-24 NOTE — Progress Notes (Signed)
PM ronds  Per RN - patient upset and frustrated she is not extubated She is off neo Donw on precedex No active bleeding Stable  I d/w paient - she is asking for extubaion   Plan - extubate  - npo - post extubation might ned lactulose - risk for unpredictable gi bleed with reintubation + but go ahead given her desire and increasing risk for VAP     SIGNATURE    Dr. Brand Males, M.D., F.C.C.P,  Pulmonary and Critical Care Medicine Staff Physician, [place] Director - Interstitial Lung Disease  Program  Pulmonary [place] [facility] [place], [place], [address]  Pager: [phone], If no answer or between  15:00h - 7:00h: call [phone] Telephone: [phone]  1:55 PM 03/24/2019

## 2019-03-24 NOTE — Progress Notes (Signed)
  Vomitted old blood Still on precdex  And neo Doing SBT  Plan  =- no extubation     SIGNATURE    Dr. Brand Males, M.D., F.C.C.P,  Pulmonary and Critical Care Medicine Staff Physician, [place] Director - Interstitial Lung Disease  Program  Pulmonary [place] at [place], [place], [address]  Pager: [phone], If no answer or between  15:00h - 7:00h: call [phone] Telephone: [phone]  12:06 PM 03/24/2019

## 2019-03-24 NOTE — Progress Notes (Signed)
NAME:  Angela Castillo, MRN:  [id number], DOB:  10-24-1976, LOS: 3 ADMISSION DATE:  03/21/2019, CONSULTATION DATE:  03/22/19 REFERRING MD:  Dr. Sharl Ma, CHIEF COMPLAINT:  GIB    Brief History   43 y/o F admitted 5/17 with hematemesis & hematochezia.  Underwent EGD 5/18 with banding of large esophageal varices. Received 2 units PRBC (correction to prior note). To ICU post procedure on mechanical ventilation.    Past Medical History  GERD HTN Elevated LFT's  Anemia  ETOH - reported rare use  Significant Hospital Events   5/17  Admit  5/18  EGD, tx to ICU   Consults:  PCCM   Procedures:  ETT 5/18 >>   Significant Diagnostic Tests:  EGD 5/18 >> Red blood in the lower third of the esophagus. Red blood in the entire stomach. Bleeding large (> 5 mm) esophageal varices. Incompletely eradicated. Banded.  Micro Data:  COVID 5/17 >> negative   Antimicrobials:  Unasyn 03/24/2019>>  Interim history/subjective:  T Max overnight of 102.1, cooling blanket initiated to avoid Tylenol with elevated LFT's + 5700 cc's Gray secretions, leukocytosis HGB stable at 7.2  Remains on Levo, fentanyl and precedex, Octreotide and protonix gtt  Objective   Blood pressure (!) 107/59, pulse 75, temperature 98.5 F (36.9 C), temperature source Oral, resp. rate 16, height 5\' 3"  (1.6 m), weight 113.9 kg, last menstrual period 02/16/2019, SpO2 99 %.    Vent Mode: PRVC FiO2 (%):  [30 %-100 %] 30 % Set Rate:  [16 bmp] 16 bmp Vt Set:  [420 mL] 420 mL PEEP:  [5 cmH20] 5 cmH20 Plateau Pressure:  [15 cmH20-23 cmH20] 18 cmH20   Intake/Output Summary (Last 24 hours) at 03/24/2019 0814 Last data filed at 03/24/2019 0700 Gross per 24 hour  Intake 2467.3 ml  Output 1600 ml  Net 867.3 ml   Filed Weights   03/21/19 0744 03/22/19 0528 03/23/19 0325  Weight: 111.6 kg 116 kg 113.9 kg    Examination: General: obese adult female lying in bed in NAD, appears critically ill HEENT: MM pink/moist, ETT secure  and intact Neuro:Intubated and  sedated , opens eyes to voice, follows commands on W[facility] CV: s1s2 rrr, no m/r/g, NSR per tele PULM: bilateral chest excursion, coarse with rhonchi throughout, diminished per bases  GI: soft, bsx4 active. No further bleeding per nursing  Extremities: warm/dry, no edema, brisk refill, no obvious deformities  Skin: no rashes or lesions, warm and dry, no mottling noted  Resolved Hospital Problem list     Assessment & Plan:   Acute GIB  -large esophageal varices noted on EGD  Hematemesis / Hematochezia  Fatty liver ? NASH P: Appreciate GI input  Will await GI decision regarding return look before extubation  Continue PPI, octreotide gtt's  Trend CBC Monitor for bleeding Transfuse for HGB < 7  Acute Respiratory Insufficiency  -in setting of hematemesis, intubated for airway protection  - New fever, ? Aspiration pneumonia>> gray secretions - + 5700 cc's P: PRVC 8 cc/kg  ABG now CXR now  Wean PEEP / FiO2 for sats > 90% Follow intermittent CXR  RASS goal 0 to -1  Precedex and Fentanyl, wean as able Will need gentle diuresis once off pressors  ? Aspiration Pneumonia ( 5/20) Leukocytosis Secretion color change overnight( 5/20) Fever overnight ( 5/20) P: CXR now Blood Cx x 2 Urine Cx\ Sputum Cx Start Unasyn per pharmacy as prophylaxis   Hypotension -related to blood loss and sedation P: Neo-synephrine as needed for  MAP > 65 Wean as able  Acute Blood Loss Anemia  -negative troponin, EKG P: Trend CBC  Transfuse per ICU guidelines   Hyperglycemia  P: SSI Q4, sensitive scale    HTN  P: Hold home cozaar   At Risk AKI  P: Trend BMP / urinary output Replace electrolytes as indicated Avoid nephrotoxic agents, ensure adequate renal perfusion   Best practice:  Diet: NPO  Pain/Anxiety/Delirium protocol (if indicated): ordered  VAP protocol (if indicated): in place  DVT prophylaxis: SCD's  GI prophylaxis: PPI gtt  Glucose  control: Sensitive scale Mobility: bedrest  Code Status: Full Code  Family Communication: Husband updated on plan of care by nursing 5/20   Disposition: ICU  Labs   CBC: Recent Labs  Lab 03/23/19 0521 03/23/19 1052 03/23/19 1316 03/23/19 1717 03/23/19 2225 03/24/19 0414  WBC 12.7* 15.8*  --  10.2 11.7* 12.5*  HGB 7.7* 7.6* 8.1* 7.5* 7.3* 7.2*  HCT 25.1* 24.5* 25.7* 23.8* 23.3* 23.2*  MCV 91.6 92.5  --  91.5 91.7 93.2  PLT 121* 120*  --  101* 102* 100*    Basic Metabolic Panel: Recent Labs  Lab 03/21/19 0757 03/21/19 1257 03/22/19 0316 03/22/19 1255 03/22/19 1741 03/23/19 0521 03/24/19 0414  NA 137  --  137 142 137 142 144  K 4.2  --  4.2 4.0 4.4 3.9 3.7  CL 106  --  109  --  110 112* 117*  CO2 24  --  24  --  GLUCOSE 324*  --  286* 161* 219* 218* 228*  BUN 13  --  22*  --  CREATININE 0.54  --  0.48  --  0.57 0.60 0.53  CALCIUM 8.6* 8.0* 7.8*  --  7.5* 7.6* 7.5*  MG  --  1.7  --   --   --   --   --   PHOS  --  2.8  --   --   --   --   --    GFR: Estimated Creatinine Clearance: 111.4 mL/min (by C-G formula based on SCr of 0.53 mg/dL). Recent Labs  Lab 03/23/19 1052 03/23/19 1717 03/23/19 2225 03/24/19 0414  WBC 15.8* 10.2 11.7* 12.5*    Liver Function Tests: Recent Labs  Lab 03/21/19 0757 03/24/19 0414  AST 49* 78*  ALT 50* 93*  ALKPHOS 133* 68  BILITOT 0.7 0.9  PROT 7.3 5.8*  ALBUMIN 3.3* 2.6*   Recent Labs  Lab 03/21/19 0756  LIPASE 35   No results for input(s): AMMONIA in the last 168 hours.  ABG    Component Value Date/Time   PHART 7.336 (L) 03/22/2019 1448   PCO2ART 44.8 03/22/2019 1448   PO2ART 201 (H) 03/22/2019 1448   HCO3 23.3 03/22/2019 1448   TCO2 25 08/16/2015 1742   ACIDBASEDEF 1.9 03/22/2019 1448   O2SAT 99.5 03/22/2019 1448     Coagulation Profile: Recent Labs  Lab 03/21/19 1257 03/22/19 0316  INR 1.3* 1.3*    Cardiac Enzymes: Recent Labs  Lab 03/22/19 1439  TROPONINI <0.03    HbA1C:  Hemoglobin A1C  Date/Time Value Ref Range Status  06/21/2016 05:34 PM 5.2  Final  06/12/2015 05:49 PM 5.50  Final   Hgb A1c MFr Bld  Date/Time Value Ref Range Status  03/21/2019 12:57 PM 9.5 (H) 4.8 - 5.6 % Final    Comment:    (NOTE) Pre diabetes:  5.7%-6.4% Diabetes:              >6.4% Glycemic control for   <7.0% adults with diabetes     CBG: Recent Labs  Lab 03/23/19 1441 03/23/19 1936 03/23/19 2324 03/24/19 0341 03/24/19 0738  GLUCAP 250* 225* 214* 205* 193*    Critical care time: 32 minutes     Bevelyn NgoSarah F. Lalanya Rufener, AGACNP-BC [facility] Pgr: [phone] or if no answer [phone] 03/24/2019, 8:14 AM

## 2019-03-24 NOTE — Progress Notes (Signed)
[facility] Gastroenterology Progress Note  Angela Castillo 43 y.o. 1976/02/13   Subjective: Intubated, sedated Objective: Vital signs: Vitals:   03/24/19 1015 03/24/19 1030  BP: 92/64 (!) 90/50  Pulse: 84 74  Resp: 13 12  Temp:    SpO2: 97% 95%  T 98.5  Physical Exam: Gen: intubated, sedated, obese, no acute distress  HEENT: anicteric sclera CV: RRR Chest: CTA B Abd: soft, nontender, nondistended, +BS, obese Ext: no edema  Lab Results: Recent Labs    03/21/19 1257  03/23/19 0521 03/24/19 0414 03/24/19 0913  NA  --    < > 142 144  --   K  --    < > 3.9 3.7  --   CL  --    < > 112* 117*  --   CO2  --    < > 22 24  --   GLUCOSE  --    < > 218* 228*  --   BUN  --    < > 16 18  --   CREATININE  --    < > 0.60 0.53  --   CALCIUM 8.0*   < > 7.6* 7.5*  --   MG 1.7  --   --   --  2.0  PHOS 2.8  --   --   --   --    < > = values in this interval not displayed.   Recent Labs    03/24/19 0414  AST 78*  ALT 93*  ALKPHOS 68  BILITOT 0.9  PROT 5.8*  ALBUMIN 2.6*   Recent Labs    03/23/19 2225 03/24/19 0414  WBC 11.7* 12.5*  HGB 7.3* 7.2*  HCT 23.3* 23.2*  MCV 91.7 93.2  PLT 102* 100*      Assessment/Plan: S/P variceal bleed - s/p EVBL X 14 (8 first EGD, 6 second EGD). No signs of ongoing bleeding. No stools reported. Hgb 7.2. Extubation planned by CCM today. No plans for repeat EGD. If rebleeding occurs, then will need a TIPS. Continue Octreotide drip. Broad spectrum Abx. Supportive care. Will follow.   Angela Castillo 03/24/2019, 11:07 AM  Questions please call [phone] ID: Angela Castillo, female   DOB: 07-23-1976, 43 y.o.   MRN: [id number]

## 2019-03-24 NOTE — Progress Notes (Signed)
eLink Physician-Brief Progress Note Patient Name: Angela Castillo DOB: 08/03/1976 MRN: [id number]   Date of Service  03/24/2019  HPI/Events of Note  Multiple issues - 1. Hypertension - BP = 158/89. Nursing requests BP medication and 2. Patient c/o heartburn - Now taking PO.  eICU Interventions  Will order: 1. Hydralazine 10 mg IV Q 4 hours PRN SBP > 170 and DBP > 100.  2. Mylanta 30 mL PO now and Q 6 hours PRN.     Intervention Category Major Interventions: Hypertension - evaluation and management;Other:  Lenell Antu 03/24/2019, 11:54 PM

## 2019-03-25 ENCOUNTER — Inpatient Hospital Stay (HOSPITAL_COMMUNITY): Payer: BC Managed Care – PPO

## 2019-03-25 DIAGNOSIS — D508 Other iron deficiency anemias: Secondary | ICD-10-CM

## 2019-03-25 LAB — TYPE AND SCREEN
ABO/RH(D): O POS
Antibody Screen: NEGATIVE
Unit division: 0
Unit division: 0
Unit division: 0
Unit division: 0

## 2019-03-25 LAB — COMPREHENSIVE METABOLIC PANEL
ALT: 86 U/L — ABNORMAL HIGH (ref 0–44)
AST: 49 U/L — ABNORMAL HIGH (ref 15–41)
Albumin: 2.7 g/dL — ABNORMAL LOW (ref 3.5–5.0)
Alkaline Phosphatase: 70 U/L (ref 38–126)
Anion gap: 6 (ref 5–15)
BUN: 15 mg/dL (ref 6–20)
CO2: 26 mmol/L (ref 22–32)
Calcium: 7.6 mg/dL — ABNORMAL LOW (ref 8.9–10.3)
Chloride: 114 mmol/L — ABNORMAL HIGH (ref 98–111)
Creatinine, Ser: 0.52 mg/dL (ref 0.44–1.00)
GFR calc Af Amer: >60 mL/min (ref >60)
GFR calc non Af Amer: >60 mL/min (ref >60)
Glucose, Bld: 223 mg/dL — ABNORMAL HIGH (ref 70–99)
Potassium: 3.1 mmol/L — ABNORMAL LOW (ref 3.5–5.1)
Sodium: 146 mmol/L — ABNORMAL HIGH (ref 135–145)
Total Bilirubin: 0.8 mg/dL (ref 0.3–1.2)
Total Protein: 6.3 g/dL — ABNORMAL LOW (ref 6.5–8.1)

## 2019-03-25 LAB — BPAM RBC
Blood Product Expiration Date: 202006102359
Blood Product Expiration Date: 202006102359
Blood Product Expiration Date: 202006112359
Blood Product Expiration Date: 202006112359
ISSUE DATE / TIME: 202005181257
ISSUE DATE / TIME: 202005181431
ISSUE DATE / TIME: 202005191121
Unit Type and Rh: 5100
Unit Type and Rh: 5100
Unit Type and Rh: 5100
Unit Type and Rh: 5100

## 2019-03-25 LAB — CBC
HCT: 23.6 % — ABNORMAL LOW (ref 36.0–46.0)
Hemoglobin: 7.1 g/dL — ABNORMAL LOW (ref 12.0–15.0)
MCH: 28.7 pg (ref 26.0–34.0)
MCHC: 30.1 g/dL (ref 30.0–36.0)
MCV: 95.5 fL (ref 80.0–100.0)
Platelets: 106 10*3/uL — ABNORMAL LOW (ref 150–400)
RBC: 2.47 MIL/uL — ABNORMAL LOW (ref 3.87–5.11)
RDW: 16.6 % — ABNORMAL HIGH (ref 11.5–15.5)
WBC: 11.7 10*3/uL — ABNORMAL HIGH (ref 4.0–10.5)
nRBC: 0.3 % — ABNORMAL HIGH (ref 0.0–0.2)

## 2019-03-25 LAB — GLUCOSE, CAPILLARY
Glucose-Capillary: 189 mg/dL — ABNORMAL HIGH (ref 70–99)
Glucose-Capillary: 201 mg/dL — ABNORMAL HIGH (ref 70–99)
Glucose-Capillary: 202 mg/dL — ABNORMAL HIGH (ref 70–99)
Glucose-Capillary: 214 mg/dL — ABNORMAL HIGH (ref 70–99)
Glucose-Capillary: 244 mg/dL — ABNORMAL HIGH (ref 70–99)
Glucose-Capillary: 299 mg/dL — ABNORMAL HIGH (ref 70–99)

## 2019-03-25 LAB — BASIC METABOLIC PANEL
Anion gap: 5 (ref 5–15)
BUN: 16 mg/dL (ref 6–20)
CO2: 26 mmol/L (ref 22–32)
Calcium: 7.6 mg/dL — ABNORMAL LOW (ref 8.9–10.3)
Chloride: 115 mmol/L — ABNORMAL HIGH (ref 98–111)
Creatinine, Ser: 0.54 mg/dL (ref 0.44–1.00)
GFR calc Af Amer: >60 mL/min (ref >60)
GFR calc non Af Amer: >60 mL/min (ref >60)
Glucose, Bld: 217 mg/dL — ABNORMAL HIGH (ref 70–99)
Potassium: 3.2 mmol/L — ABNORMAL LOW (ref 3.5–5.1)
Sodium: 146 mmol/L — ABNORMAL HIGH (ref 135–145)

## 2019-03-25 LAB — BRAIN NATRIURETIC PEPTIDE: B Natriuretic Peptide: 119 pg/mL — ABNORMAL HIGH (ref 0.0–100.0)

## 2019-03-25 LAB — HEPATITIS C ANTIBODY: HCV Ab: <0.1 s/co ratio (ref 0.0–0.9)

## 2019-03-25 LAB — PHOSPHORUS: Phosphorus: 1.7 mg/dL — ABNORMAL LOW (ref 2.5–4.6)

## 2019-03-25 LAB — LACTIC ACID, PLASMA: Lactic Acid, Venous: 1 mmol/L (ref 0.5–1.9)

## 2019-03-25 LAB — HEPATITIS B SURFACE ANTIGEN: Hepatitis B Surface Ag: NEGATIVE

## 2019-03-25 LAB — MAGNESIUM: Magnesium: 2.1 mg/dL (ref 1.7–2.4)

## 2019-03-25 IMAGING — DX PORTABLE CHEST - 1 VIEW
1 series · 1 of 1 positions shown · non-contrast
Comparison: [DATE]

CLINICAL DATA: Respiratory failure

EXAM:
PORTABLE CHEST 1 VIEW

[chest ap]
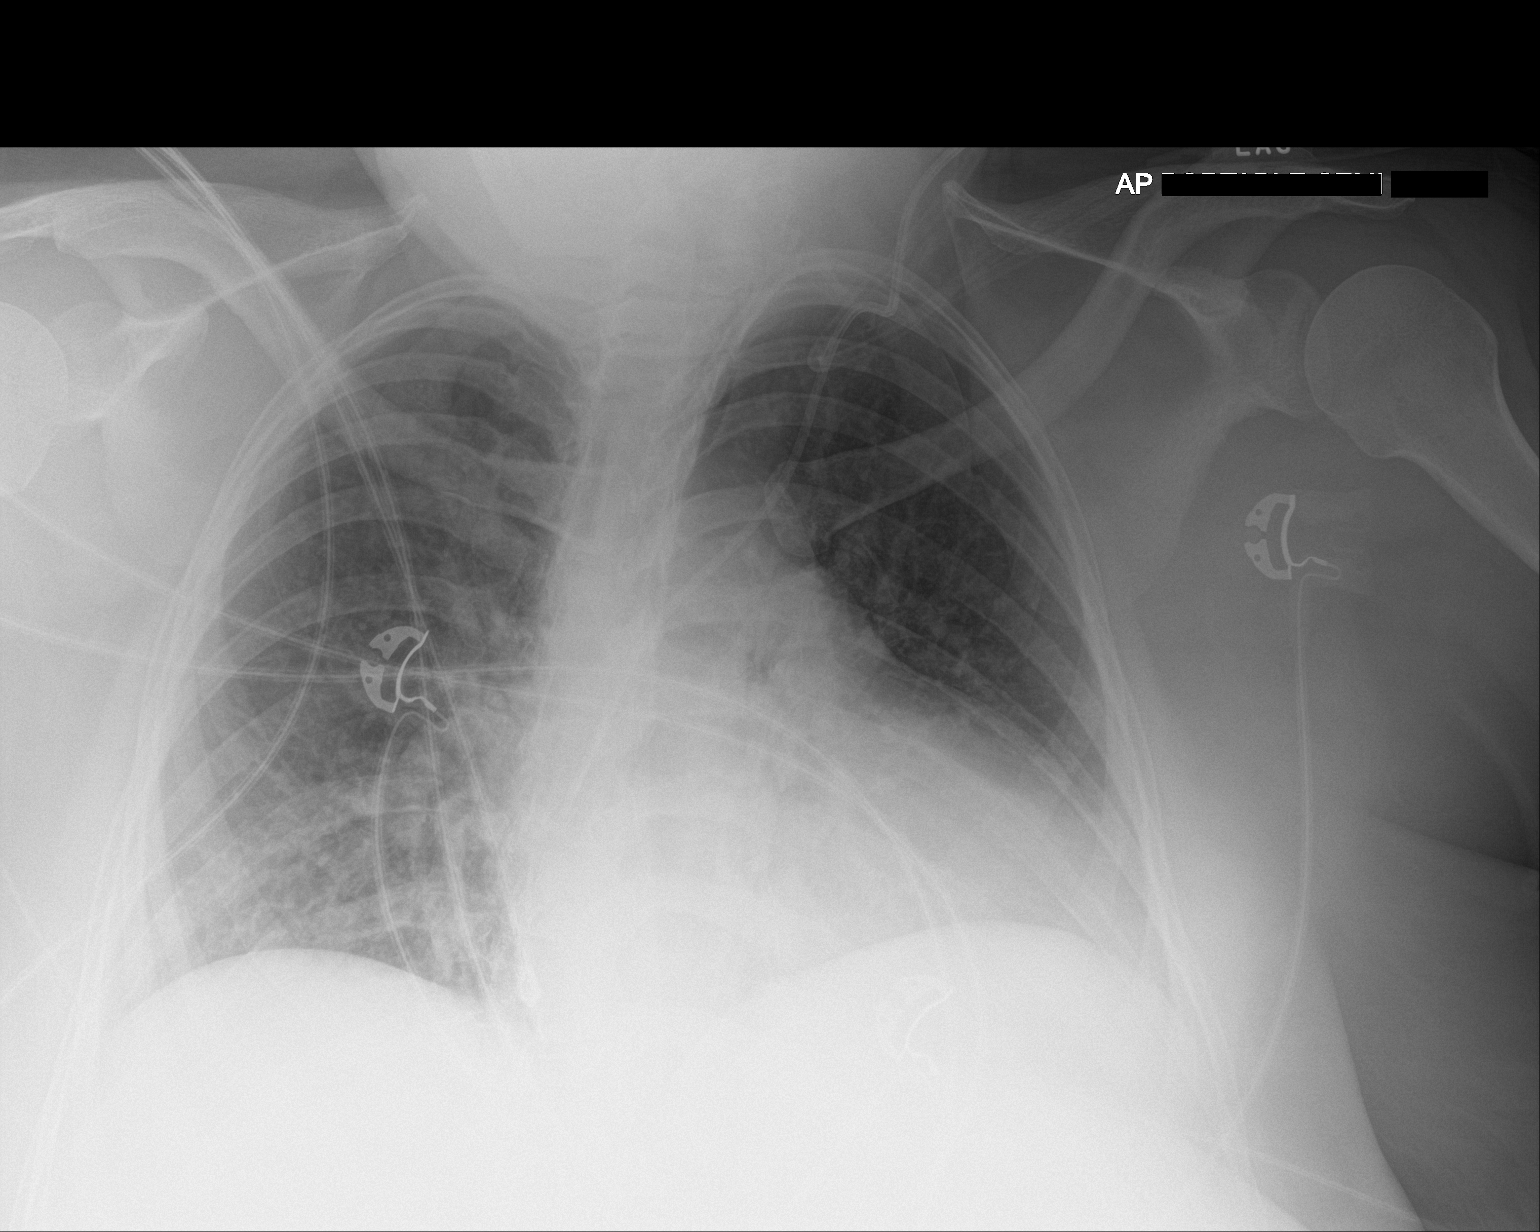

[1 of 1 positions shown; findings below may reference images not displayed]

FINDINGS: Cardiac shadow is mildly enlarged but stable. Left jugular central
line is noted in satisfactory position. The endotracheal tube is
been removed. No focal infiltrate or sizable effusion is seen. No
bony abnormality is noted.
IMPRESSION: No acute abnormality seen.

## 2019-03-25 MED ORDER — FUROSEMIDE 10 MG/ML IJ SOLN
40.0000 mg | Freq: Two times a day (BID) | INTRAMUSCULAR | Status: AC
  Administered 2019-03-25 (×2): 40 mg via INTRAVENOUS
  Filled 2019-03-25 (×2): qty 4

## 2019-03-25 MED ORDER — FERROUS SULFATE 325 (65 FE) MG PO TABS
325.0000 mg | ORAL_TABLET | Freq: Every day | ORAL | Status: DC
  Administered 2019-03-26 – 2019-03-29 (×4): 325 mg via ORAL
  Filled 2019-03-25 (×4): qty 1

## 2019-03-25 MED ORDER — PHENOL 1.4 % MT LIQD
1.0000 | OROMUCOSAL | Status: DC | PRN
  Filled 2019-03-25 (×2): qty 177

## 2019-03-25 MED ORDER — POTASSIUM PHOSPHATES 15 MMOLE/5ML IV SOLN
30.0000 mmol | Freq: Once | INTRAVENOUS | Status: AC
  Administered 2019-03-25: 30 mmol via INTRAVENOUS
  Filled 2019-03-25: qty 10

## 2019-03-25 NOTE — Progress Notes (Signed)
[facility] Gastroenterology Progress Note  SHAWNNA PANCAKE 43 y.o. 11-11-1975   Subjective: Small smear of black stools per nursing otherwise no rectal bleeding. No hematemesis. Denies abdominal pain, nausea, or vomiting. Lying in bedside chair. Tolerating ice chips.  Objective: Vital signs: Vitals:   03/25/19 1200 03/25/19 1300  BP: (!) 151/89 139/82  Pulse: 94 91  Resp: 16 15  Temp:    SpO2: 94% 93%  T 98.7  Physical Exam: Gen: lethargic, obese, no acute distress  HEENT: anicteric sclera CV: RRR Chest: CTA B Abd: periumbilical tenderness without guarding, soft, nondistended, +BS Ext: no edema  Lab Results: Recent Labs    03/24/19 0913 03/25/19 0032 03/25/19 0347  NA  --  146* 146*  K  --  3.2* 3.1*  CL  --  115* 114*  CO2  --  26 26  GLUCOSE  --  217* 223*  BUN  --  16 15  CREATININE  --  0.54 0.52  CALCIUM  --  7.6* 7.6*  MG 2.0  --  2.1  PHOS  --   --  1.7*   Recent Labs    03/24/19 0414 03/25/19 0347  AST 78* 49*  ALT 93* 86*  ALKPHOS 68 70  BILITOT 0.9 0.8  PROT 5.8* 6.3*  ALBUMIN 2.6* 2.7*   Recent Labs    03/24/19 1351 03/24/19 2100 03/25/19 0347  WBC 9.3 12.5* 11.7*  NEUTROABS 7.4  --   --   HGB 7.0* 7.3* 7.1*  HCT 22.5* 24.0* 23.6*  MCV 93.8 94.9 95.5  PLT 89* 116* 106*      Assessment/Plan: S/P variceal bleed with band ligation X 14. Likely has NASH cirrhosis. Hep B, C negative. No signs of ongoing bleeding. Hgb stable at 7.1. Continue Octreotide drip. Clear liquid diet. Supportive care. If rebleeds while she is in hospital, then needs a TIPS. Will need repeat EGD as outpt for f/u banding to eradicate her varices. Will follow.   [name] 03/25/2019, 1:34 PM  Questions please call [phone] ID: [name], female   DOB: [birth year], 43 y.o.   MRN: [id number]

## 2019-03-25 NOTE — Progress Notes (Signed)
[place] [facility] Progress Note Patient Name: Angela Castillo DOB: 1976/03/06 MRN: [id number]   Date of Service  03/25/2019  HPI/Events of Note  Hypokalemia and Hypophosphatemia - K+ = 3.1, PO--- = 1.7 and Creatinine = 0.52.  eICU Interventions  Will replace K+ and PO4---.     Intervention Category Major Interventions: Electrolyte abnormality - evaluation and management  Sommer,Steven Eugene 03/25/2019, 5:17 AM

## 2019-03-25 NOTE — Evaluation (Signed)
Physical Therapy Evaluation Patient Details Name: Angela FothergillMichelle D Castillo MRN: [id number] DOB: 1975-11-27 Today's Date: 03/25/2019   History of Present Illness  43 year old female admitted for GIB.  s/p intubation.  PMH:  shoulder and back pain, headaches  Clinical Impression  Pt admitted as above and presenting with functional mobility limitations 2* generalized weakness, limited endurance, and mild ambulatory balance deficits.  Pt should progress to dc home with assist of family.    Follow Up Recommendations No PT follow up    Equipment Recommendations  None recommended by PT    Recommendations for Other Services OT consult     Precautions / Restrictions Precautions Precautions: Fall Restrictions Weight Bearing Restrictions: No      Mobility  Bed Mobility Overal bed mobility: Needs Assistance Bed Mobility: Supine to Sit     Supine to sit: Min assist;Mod assist;HOB elevated     General bed mobility comments: assist for trunk and cues for sequencing  Transfers Overall transfer level: Needs assistance Equipment used: Rolling walker (2 wheeled) Transfers: Sit to/from Stand Sit to Stand: Min assist;+2 safety/equipment         General transfer comment: light assistance to stand and steady.  Ambulation/Gait Ambulation/Gait assistance: Min assist;+2 physical assistance;+2 safety/equipment Gait Distance (Feet): 6 Feet Assistive device: Rolling walker (2 wheeled) Gait Pattern/deviations: Step-to pattern;Step-through pattern;Decreased step length - right;Decreased step length - left;Shuffle;Trunk flexed Gait velocity: decr   General Gait Details: cues for posture and position from RW; distance ltd by fatigue  Stairs            Wheelchair Mobility    Modified Rankin (Stroke Patients Only)       Balance Overall balance assessment: Mild deficits observed, not formally tested                                           Pertinent  Vitals/Pain Pain Assessment: Faces Faces Pain Scale: Hurts a little bit Pain Location: stomach when coughing; throat sore Pain Descriptors / Indicators: Sore Pain Intervention(s): Limited activity within patient's tolerance;Monitored during session;Premedicated before session;Ice applied    Home Living Family/patient expects to be discharged to:: Private residence Living Arrangements: Spouse/significant other;Children Available Help at Discharge: Family Type of Home: House Home Access: Level entry     Home Layout: One level   Additional Comments: husband and 43 year old son work during the day    Prior Function Level of Independence: Independent               Higher education careers adviserHand Dominance        Extremity/Trunk Assessment   Upper Extremity Assessment Upper Extremity Assessment: Generalized weakness    Lower Extremity Assessment Lower Extremity Assessment: Generalized weakness       Communication   Communication: No difficulties  Cognition Arousal/Alertness: Awake/alert Behavior During Therapy: WFL for tasks assessed/performed Overall Cognitive Status: Within Functional Limits for tasks assessed                                        General Comments      Exercises     Assessment/Plan    PT Assessment Patient needs continued PT services  PT Problem List Decreased strength;Decreased range of motion;Decreased activity tolerance;Decreased mobility;Decreased knowledge of use of DME;Obesity  PT Treatment Interventions DME instruction;Gait training;Functional mobility training;Therapeutic activities;Therapeutic exercise;Patient/family education    PT Goals (Current goals can be found in the Care Plan section)  Acute Rehab PT Goals Patient Stated Goal: return to PLOF PT Goal Formulation: With patient Time For Goal Achievement: 04/08/19 Potential to Achieve Goals: Good    Frequency Min 3X/week   Barriers to discharge        Co-evaluation                AM-PAC PT "6 Clicks" Mobility  Outcome Measure Help needed turning from your back to your side while in a flat bed without using bedrails?: A Lot Help needed moving from lying on your back to sitting on the side of a flat bed without using bedrails?: A Lot Help needed moving to and from a bed to a chair (including a wheelchair)?: A Lot Help needed standing up from a chair using your arms (e.g., wheelchair or bedside chair)?: A Lot Help needed to walk in hospital room?: A Lot Help needed climbing 3-5 steps with a railing? : A Lot 6 Click Score: 12    End of Session Equipment Utilized During Treatment: Gait belt Activity Tolerance: Patient tolerated treatment well Patient left: in chair;with call bell/phone within reach;with chair alarm set Nurse Communication: Mobility status PT Visit Diagnosis: Muscle weakness (generalized) (M62.81);Unsteadiness on feet (R26.81);Difficulty in walking, not elsewhere classified (R26.2);Pain;Other symptoms and signs involving the nervous system (R29.898)    Time: 5465-0354 PT Time Calculation (min) (ACUTE ONLY): 28 min   Charges:   PT Evaluation $PT Eval Low Complexity: 1 Low          Mauro Kaufmann PT Acute Rehabilitation Services Pager [phone] Office [phone]   Angela Castillo 03/25/2019, 2:18 PM

## 2019-03-25 NOTE — Progress Notes (Signed)
NAME:  Angela Castillo, MRN:  [id number], DOB:  08/10/1976, LOS: 4 ADMISSION DATE:  03/21/2019, CONSULTATION DATE:  03/22/19 REFERRING MD:  Dr. Sharl Ma, CHIEF COMPLAINT:  GIB    Brief History   43 y/o F admitted 5/17 with hematemesis & hematochezia.  Underwent EGD 5/18 with banding of large esophageal varices. Received 2 units PRBC (correction to prior note). To ICU post procedure on mechanical ventilation.    Past Medical History  GERD HTN Elevated LFT's  Anemia  ETOH - reported rare use  Significant Hospital Events   5/17  Admit  5/18  EGD, tx to ICU   Consults:  PCCM   Procedures:  ETT 5/18 >>   Significant Diagnostic Tests:  EGD 5/18 >> Red blood in the lower third of the esophagus. Red blood in the entire stomach. Bleeding large (> 5 mm) esophageal varices. Incompletely eradicated. Banded.  Micro Data:  COVID 5/17 >> negative  Sputum 5/20>>GS mod G+ cocci, Few G- rods>> Blood 5/20 >> Antimicrobials:  Unasyn 03/24/2019>>  Interim history/subjective:  T Max overnight of 99.7, + 4600 cc's Gray secretions, leukocytosis HGB stable at 7.1 , was 7.3 on  5/20 Off pressors, remains on  Octreotide and protonix gtt  Objective   Blood pressure (!) 166/93, pulse 95, temperature 98.5 F (36.9 C), temperature source Oral, resp. rate 16, height  (1.6 m), weight 114.3 kg, last menstrual period 02/16/2019, SpO2 97 %.    Vent Mode: PRVC FiO2 (%):  [35 %] 35 % Set Rate:  [16 bmp] 16 bmp Vt Set:  [420 mL] 420 mL PEEP:  [5 cmH20] 5 cmH20 Plateau Pressure:  [22 cmH20] 22 cmH20   Intake/Output Summary (Last 24 hours) at 03/25/2019 0813 Last data filed at 03/25/2019 0600 Gross per 24 hour  Intake 1576.26 ml  Output 2950 ml  Net -1373.74 ml   Filed Weights   03/22/19 0528 03/23/19 0325 03/25/19 0404  Weight: 116 kg 113.9 kg 114.3 kg    Examination: General: obese adult female lying in bed in NAD, awake and alert HEENT: MM pink/moist, No LAD Neuro:Awake and alert and  oriented  x 3, appropriate, follows commands , MAE x 4 CV: s1s2 rrr, no m/r/g, NSR per tele PULM: bilateral chest excursion, coarse with rhonchi throughout, diminished per bases , No wheeze, CXR reviewed by me GI: soft, bsx4 active. Slightly distended, No further bleeding per nursing  Extremities: warm/dry, trace  edema, brisk refill, no obvious deformities  Skin: no rashes or lesions, warm and dry, no mottling noted  Resolved Hospital Problem list     Assessment & Plan:   Acute GIB  -large esophageal varices noted on EGD  Hematemesis / Hematochezia  Fatty liver ? NASH Nausea P: Appreciate GI input   Continue PPI, octreotide gtt's per GI Trend CBC Monitor for bleeding Transfuse for HGB < 7 Zofran prn for nausea>> QTc monitoring  Acute Respiratory Insufficiency  -in setting of hematemesis, intubated for airway protection  - New fever, ? Aspiration pneumonia>> gray secretions>> improving - + 4600 cc's Extubated 5/20 Now on 2 L Simms P: Swallow eval once cleared to take po's by GI Aggressive pulmonary toilet IS OOB PT/OT Follow intermittent CXR  RASS goal 0 to -1  Lasix 40 mg x 2 on 5/21( renal function is good)  ? Aspiration Pneumonia ( 5/20) in setting of GI bleed Leukocytosis with slight improvement T max 99.7  P: Aggressive Pulmonary Toilet IS Q 1 while awake Titrate oxygen as able  for sats > 94% PT/OT Trend CXR Follow micro>> Sputum GS mod G+ cocci, Few G- rods Continue  Unasyn  as prophylaxis   Hypotension -related to blood loss and sedation P: Neo-synephrine as needed for MAP > 65 Wean as able  Acute Blood Loss Anemia  -negative troponin, EKG P: Trend CBC  Transfuse per ICU guidelines   Hyperglycemia  P: SSI Q4, sensitive scale    HTN  P: Hold home cozaar for now Consider re-starting once  She has been diuresed PRN Hydralazine added  At Risk AKI  Hypo phos Hypo K P: Replete K Phos 5/21 Trend BMP / urinary output Replace electrolytes  as indicated Avoid nephrotoxic agents, ensure adequate renal perfusion   Best practice:  Diet: NPO except for chips  Pain/Anxiety/Delirium protocol (if indicated): NA  VAP protocol (if indicated): in place  DVT prophylaxis: SCD's  GI prophylaxis: PPI gtt  Glucose control: Sensitive scale Mobility: OOB to chair as tolerated Code Status: Full Code  Family Communication: Husband updated on plan of care by nursing 5/21   Disposition: Progressive care  Labs   CBC: Recent Labs  Lab 03/23/19 2225 03/24/19 0414 03/24/19 1351 03/24/19 2100 03/25/19 0347  WBC 11.7* 12.5* 9.3 12.5* 11.7*  NEUTROABS  --   --  7.4  --   --   HGB 7.3* 7.2* 7.0* 7.3* 7.1*  HCT 23.3* 23.2* 22.5* 24.0* 23.6*  MCV 91.7 93.2 93.8 94.9 95.5  PLT 102* 100* 89* 116* 106*    Basic Metabolic Panel: Recent Labs  Lab 03/21/19 1257  03/22/19 1741 03/23/19 0521 03/24/19 0414 03/24/19 0913 03/25/19 0032 03/25/19 0347  NA  --    < > 137 142 144  --  146* 146*  K  --    < > 4.4 3.9 3.7  --  3.2* 3.1*  CL  --    < > 110 112* 117*  --  115* 114*  CO2  --    < > 24 22 24   --  26 26  GLUCOSE  --    < > 219* 218* 228*  --  217* 223*  BUN  --    < > 17 16 18   --  16 15  CREATININE  --    < > 0.57 0.60 0.53  --  0.54 0.52  CALCIUM 8.0*   < > 7.5* 7.6* 7.5*  --  7.6* 7.6*  MG 1.7  --   --   --   --  2.0  --  2.1  PHOS 2.8  --   --   --   --   --   --  1.7*   < > = values in this interval not displayed.   GFR: Estimated Creatinine Clearance: 111.6 mL/min (by C-G formula based on SCr of 0.52 mg/dL). Recent Labs  Lab 03/24/19 0414 03/24/19 1351 03/24/19 2100 03/25/19 0347  WBC 12.5* 9.3 12.5* 11.7*  LATICACIDVEN  --   --   --  1.0    Liver Function Tests: Recent Labs  Lab 03/21/19 0757 03/24/19 0414 03/25/19 0347  AST 49* 78* 49*  ALT 50* 93* 86*  ALKPHOS 133* 68 70  BILITOT 0.7 0.9 0.8  PROT 7.3 5.8* 6.3*  ALBUMIN 3.3* 2.6* 2.7*   Recent Labs  Lab 03/21/19 0756  LIPASE 35   No results for  input(s): AMMONIA in the last 168 hours.  ABG    Component Value Date/Time   PHART 7.401 03/24/2019 7989  PCO2ART 41.3 03/24/2019 0838   PO2ART 60.7 (L) 03/24/2019 0838   HCO3 25.1 03/24/2019 0838   TCO2 25 08/16/2015 1742   ACIDBASEDEF 1.9 03/22/2019 1448   O2SAT 90.2 03/24/2019 0838     Coagulation Profile: Recent Labs  Lab 03/21/19 1257 03/22/19 0316  INR 1.3* 1.3*    Cardiac Enzymes: Recent Labs  Lab 03/22/19 1439 03/24/19 1011  TROPONINI <0.03 <0.03    HbA1C: Hemoglobin A1C  Date/Time Value Ref Range Status  06/21/2016 05:34 PM 5.2  Final  06/12/2015 05:49 PM 5.50  Final   Hgb A1c MFr Bld  Date/Time Value Ref Range Status  03/21/2019 12:57 PM 9.5 (H) 4.8 - 5.6 % Final    Comment:    (NOTE) Pre diabetes:          5.7%-6.4% Diabetes:              >6.4% Glycemic control for   <7.0% adults with diabetes     CBG: Recent Labs  Lab 03/24/19 1538 03/24/19 1936 03/25/19 0041 03/25/19 0341 03/25/19 0729  GLUCAP 212* 185* 189* 201* 214*    Critical care time: 31 minutes     Bevelyn NgoSarah F. Waverly Tarquinio, AGACNP-BC [facility] Pgr: [phone] or if no answer [phone] 03/25/2019, 8:13 AM

## 2019-03-25 NOTE — Evaluation (Signed)
Occupational Therapy Evaluation Patient Details Name: Angela Castillo MRN: [id number] DOB: 11-Jun-1976 Today's Date: 03/25/2019    History of Present Illness 43 year old female admitted for GIB.  s/p intubation.  PMH:  shoulder and back pain, headaches   Clinical Impression   Pt was admitted for the above. At baseline, she is independent with adls/iadls. Pt was limited by fatique and presents with generalized weakness. Hgb was 7.1 today. She lives with her husband and 65 year old son, both of whom work during the day. Will follow in acute setting with supervision level goals    Follow Up Recommendations  Supervision/Assistance - 24 hour    Equipment Recommendations  3 in 1 bedside commode    Recommendations for Other Services       Precautions / Restrictions Precautions Precautions: Fall Restrictions Weight Bearing Restrictions: No      Mobility Bed Mobility Overal bed mobility: Needs Assistance Bed Mobility: Supine to Sit     Supine to sit: Min assist;Mod assist;HOB elevated     General bed mobility comments: assist for trunk and cues for sequencing  Transfers Overall transfer level: Needs assistance Equipment used: Rolling walker (2 wheeled) Transfers: Sit to/from Stand Sit to Stand: Min assist;+2 safety/equipment         General transfer comment: light assistance to stand and steady.    Balance                                           ADL either performed or assessed with clinical judgement   ADL Overall ADL's : Needs assistance/impaired Eating/Feeding: Independent   Grooming: Set up   Upper Body Bathing: Set up;Supervision/ safety(for lines)   Lower Body Bathing: Moderate assistance   Upper Body Dressing : Minimal assistance(lines)   Lower Body Dressing: Maximal assistance   Toilet Transfer: Minimal assistance;+2 for safety/equipment;Stand-pivot;RW(to chair)             General ADL Comments: pt limited by soreness  and general weakness     Vision         Perception     Praxis      Pertinent Vitals/Pain Pain Assessment: Faces Faces Pain Scale: Hurts a little bit Pain Location: stomahc when coughing; throat sore Pain Intervention(s): Limited activity within patient's tolerance;Monitored during session     Hand Dominance     Extremity/Trunk Assessment Upper Extremity Assessment Upper Extremity Assessment: Generalized weakness           Communication Communication Communication: No difficulties(s/p intubation; soft voice)   Cognition Arousal/Alertness: Awake/alert Behavior During Therapy: WFL for tasks assessed/performed Overall Cognitive Status: Within Functional Limits for tasks assessed                                     General Comments       Exercises     Shoulder Instructions      Home Living Family/patient expects to be discharged to:: Private residence Living Arrangements: Spouse/significant other;Children                 Bathroom Shower/Tub: Chief Strategy Officer: Standard         Additional Comments: husband and 5 year old son work during the day      Prior Functioning/Environment Level of Independence: Independent  OT Problem List: Decreased strength;Decreased activity tolerance;Impaired balance (sitting and/or standing);Decreased knowledge of use of DME or AE;Pain      OT Treatment/Interventions: Self-care/ADL training;Therapeutic exercise;DME and/or AE instruction;Therapeutic activities;Balance training;Patient/family education;Energy conservation    OT Goals(Current goals can be found in the care plan section) Acute Rehab OT Goals Patient Stated Goal: return to PLOF OT Goal Formulation: With patient Time For Goal Achievement: 04/08/19 Potential to Achieve Goals: Good ADL Goals Pt Will Perform Grooming: with supervision;standing Pt Will Transfer to Toilet: with  supervision;ambulating;bedside commode Pt Will Perform Toileting - Clothing Manipulation and hygiene: with supervision;sit to/from stand Additional ADL Goal #1: pt will perform adl with set up/supervision and using AE as needed Additional ADL Goal #2: pt will verbalize 3 energy conservation strategies  OT Frequency: Min 2X/week   Barriers to D/C:            Co-evaluation              AM-PAC OT "6 Clicks" Daily Activity     Outcome Measure Help from another person eating meals?: None Help from another person taking care of personal grooming?: A Little Help from another person toileting, which includes using toliet, bedpan, or urinal?: A Lot Help from another person bathing (including washing, rinsing, drying)?: A Lot Help from another person to put on and taking off regular upper body clothing?: A Little Help from another person to put on and taking off regular lower body clothing?: A Lot 6 Click Score: 16   End of Session Nurse Communication: Mobility status  Activity Tolerance: Patient limited by fatigue Patient left: in chair;with call bell/phone within reach;with chair alarm set  OT Visit Diagnosis: Muscle weakness (generalized) (M62.81)                Time: [id number] OT Time Calculation (min): 27 min Charges:  OT General Charges $OT Visit: 1 Visit OT Evaluation $OT Eval Low Complexity: 1 Low  Marica OtterMaryellen Karlie Aung, OTR/L Acute Rehabilitation Services [phone] [facility] pager [phone] office 03/25/2019  Wilman Tucker 03/25/2019, 12:06 PM

## 2019-03-25 NOTE — Progress Notes (Signed)
Nutrition Follow-up  RD working remotely.   DOCUMENTATION CODES:   Morbid obesity  INTERVENTION:  - diet advancement as medically feasible. - will monitor for nutrition-related needs, such as need for ONS, with diet advancement.    NUTRITION DIAGNOSIS:   Inadequate oral intake related to inability to eat as evidenced by estimated needs. -ongoing  GOAL:   Patient will meet greater than or equal to 90% of their needs -unmet/unable to meet  MONITOR:   Diet advancement, Labs, Weight trends  ASSESSMENT:   43 y.o. female with medical history significant of extensive history of hypertension, chronic shoulder pain, back pain, headaches, GERD, Fronek anemia of iron deficiency admitted for hematemesis.  Patient was extubated yesterday at ~2:15 PM. Weight +2.7 kg/6 lb from admission (5/17) to today. Used admission weight of 111.6 kg to re-estimate nutrition needs. Patient has remained NPO s/p extubation as SLP was unable to work with her yesterday afternoon d/t recent extubation.   Per notes: GIB appears to be resolved after banding x2, aspiration PNA--improving, deconditioned, cirrhosis, anemia 2/2 bleeding.   Medications reviewed; 325 mg ferrous sulfate/day, 40 mg IV lasix x1 dose 5/20 and x2 doses 5/21, sliding scale novolog, 10 g lactulose TID, 8 mg/hr IV protonix, 10 mEq IV KCl x3 runs 5/20, 30 mmol IV KPhos x1 run 5/21. Labs reviewed; CBGs: 189, 201, and 214 mg/dl today, Ca: 7.6 mg/dl, Phos: 1.7 mg/dl, Na: 037 mmol/l, K: 3.1 mmol/l, Cl: 114 mmol/l, LFTs elevated.     Diet Order:   Diet Order            Diet NPO time specified  Diet effective now              EDUCATION NEEDS:   No education needs have been identified at this time  Skin:  Skin Assessment: Reviewed RN Assessment  Last BM:  5/18  Height:   Ht Readings from Last 1 Encounters:  03/23/19 5\' 3"  (1.6 m)    Weight:   Wt Readings from Last 1 Encounters:  03/25/19 114.3 kg    Ideal Body Weight:   52.3 kg  BMI:  Body mass index is 44.64 kg/m.  Estimated Nutritional Needs:   Kcal:  2010-2230 kcal  Protein:  125-135 grams  Fluid:  >/= 2 L/day     Trenton Gammon, MS, RD, LDN, [facility] Inpatient Clinical Dietitian Pager # [phone] After hours/weekend pager # [phone]

## 2019-03-25 NOTE — Evaluation (Signed)
Clinical/Bedside Swallow Evaluation Patient Details  Name: Angela Castillo MRN: [id number] Date of Birth: Mar 13, 1976  Today's Date: 03/25/2019 Time: SLP Start Time (ACUTE ONLY): 1400 SLP Stop Time (ACUTE ONLY): 1420 SLP Time Calculation (min) (ACUTE ONLY): 20 min  Past Medical History:  Past Medical History:  Diagnosis Date  . Anemia Dx January 03 2015  . Elevated liver enzymes   . Hypertension Dx Apr 2016  . Reflux    Past Surgical History:  Past Surgical History:  Procedure Laterality Date  . ESOPHAGEAL BANDING  03/22/2019   Procedure: ESOPHAGEAL BANDING;  Surgeon: Charlott Rakes, MD;  Location: [facility];  Service: Endoscopy;;  . ESOPHAGEAL BANDING  03/23/2019   Procedure: ESOPHAGEAL BANDING;  Surgeon: Charlott Rakes, MD;  Location: [facility];  Service: Endoscopy;;  . ESOPHAGOGASTRODUODENOSCOPY N/A 03/22/2019   Procedure: ESOPHAGOGASTRODUODENOSCOPY (EGD);  Surgeon: Charlott Rakes, MD;  Location: [facility];  Service: Endoscopy;  Laterality: N/A;  . ESOPHAGOGASTRODUODENOSCOPY N/A 03/23/2019   Procedure: ESOPHAGOGASTRODUODENOSCOPY (EGD);  Surgeon: Charlott Rakes, MD;  Location: [facility];  Service: Endoscopy;  Laterality: N/A;  . TUBAL LIGATION  Jul 25, 2000   HPI:  Patient is a 43 y.o. female admitted with GI bleed, s/p banding x8 of esophageal varices. She then required a repeat EGD and additional banding x6 due to more variceal bleeding. She was intubated from 5/18 to 5/21 and GI started her on clear liquids. PMH: shoulder and back pain, HA.   Assessment / Plan / Recommendation Clinical Impression  Patient presents with an oropharyngeal swallow that is WFL-WNL, without any overt s/s of aspiration or penetration, good laryngeal elevation and pharyngeal contraction per palpation. Although she had a mildly hoarse voice, vocal intensity was strong and patient with strong cough. SLP intervention for swallow not indicated at this time and patient is safe to  upgrade diet as per GI recommendations.  SLP Visit Diagnosis: Dysphagia, unspecified (R13.10)    Aspiration Risk  No limitations    Diet Recommendation Thin liquid   Liquid Administration via: Straw;Cup Medication Administration: Other (Comment)(per GI recommendations) Supervision: Patient able to self feed Compensations: Slow rate;Small sips/bites Postural Changes: Seated upright at 90 degrees;Remain upright for at least 30 minutes after po intake    Other  Recommendations Oral Care Recommendations: Patient independent with oral care   Follow up Recommendations None      Frequency and Duration   N/A         Prognosis   N/A     Swallow Study   General Date of Onset: 03/21/19 HPI: Patient is a 43 y.o. female admitted with GI bleed, s/p banding x8 of esophageal varices. She then required a repeat EGD and additional banding x6 due to more variceal bleeding. She was intubated from 5/18 to 5/21 and GI started her on clear liquids. PMH: shoulder and back pain, HA. Type of Study: Bedside Swallow Evaluation Previous Swallow Assessment: N/A Diet Prior to this Study: Thin liquids Temperature Spikes Noted: No Respiratory Status: Room air History of Recent Intubation: Yes Length of Intubations (days): 4 days Date extubated: 03/25/19 Behavior/Cognition: Alert;Cooperative;Pleasant mood Oral Cavity Assessment: Within Functional Limits Oral Care Completed by SLP: No Oral Cavity - Dentition: Adequate natural dentition Vision: Functional for self-feeding Self-Feeding Abilities: Able to feed self Patient Positioning: Upright in chair Baseline Vocal Quality: Hoarse(mildly hoarse voice) Volitional Cough: Strong Volitional Swallow: Able to elicit    Oral/Motor/Sensory Function Overall Oral Motor/Sensory Function: Within functional limits   Ice Chips     Thin Liquid Thin  Liquid: Within functional limits Presentation: Straw;Self Fed Other Comments: No overt s/s of aspiration or  penetration, laryngeal elevation and pharyngeal contraction both WFL-WNL per palpation    Nectar Thick Nectar Thick Liquid: Not tested   Honey Thick Honey Thick Liquid: Not tested   Puree Puree: Not tested   Solid     Solid: Not tested      Elio ForgetPreston, Venita Seng Tarrell 03/25/2019,4:10 PM    Angela NevinJohn T. Chaos Carlile, MA, CCC-SLP Speech Therapy [facility] Acute Rehab Pager: [phone]

## 2019-03-25 NOTE — Progress Notes (Signed)
[facility] Progress Note Patient Name: Angela Castillo DOB: Mar 28, 1976 MRN: [id number]   Date of Service  03/25/2019  HPI/Events of Note  Patient c/o sore throat and request Chloraseptic Throat spray PRN.   eICU Interventions  Will order: 1. Chloraseptic Spray 1 spray to throat PRN.      Intervention Category Major Interventions: Other:  Sommer,Steven [name] 03/25/2019, 1:50 AM

## 2019-03-25 NOTE — Progress Notes (Signed)
Inpatient Diabetes Program Recommendations  AACE/ADA: New Consensus Statement on Inpatient Glycemic Control (2015)  Target Ranges:  Prepandial:   less than 140 mg/dL      Peak postprandial:   less than 180 mg/dL (1-2 hours)      Critically ill patients:  140 - 180 mg/dL   Lab Results  Component Value Date   GLUCAP 244 (H) 03/25/2019   HGBA1C 9.5 (H) 03/21/2019    Review of Glycemic Control Results for Angela Castillo, Angela Castillo (MRN [id number]) as of 03/25/2019 11:15  Ref. Range 03/24/2019 19:36 03/25/2019 00:41 03/25/2019 03:41 03/25/2019 07:29 03/25/2019 11:02  Glucose-Capillary Latest Ref Range: 70 - 99 mg/dL 762 (H) 831 (H) 517 (H) 214 (H) 244 (H)   Inpatient Diabetes Program Recommendations:   Consider Levemir 6 units bid (0.1 units/kg x 114.3kg = 11.4 units)  Thank you, Darel Hong E. Jahlon Baines, RN, MSN, CDE  Diabetes Coordinator Inpatient Glycemic Control Team Team Pager [phone] (8am-5pm) 03/25/2019 11:19 AM

## 2019-03-26 DIAGNOSIS — E669 Obesity, unspecified: Secondary | ICD-10-CM

## 2019-03-26 LAB — PREPARE FRESH FROZEN PLASMA
Unit division: 0
Unit division: 0

## 2019-03-26 LAB — CBC
HCT: 21.3 % — ABNORMAL LOW (ref 36.0–46.0)
Hemoglobin: 6.4 g/dL — CL (ref 12.0–15.0)
MCH: 28.7 pg (ref 26.0–34.0)
MCHC: 30 g/dL (ref 30.0–36.0)
MCV: 95.5 fL (ref 80.0–100.0)
Platelets: 84 10*3/uL — ABNORMAL LOW (ref 150–400)
RBC: 2.23 MIL/uL — ABNORMAL LOW (ref 3.87–5.11)
RDW: 16.7 % — ABNORMAL HIGH (ref 11.5–15.5)
WBC: 7.5 10*3/uL (ref 4.0–10.5)
nRBC: 1.3 % — ABNORMAL HIGH (ref 0.0–0.2)

## 2019-03-26 LAB — URINE CULTURE
Culture: 50000 — AB
Special Requests: NORMAL

## 2019-03-26 LAB — COMPREHENSIVE METABOLIC PANEL
ALT: 60 U/L — ABNORMAL HIGH (ref 0–44)
AST: 30 U/L (ref 15–41)
Albumin: 2.7 g/dL — ABNORMAL LOW (ref 3.5–5.0)
Alkaline Phosphatase: 70 U/L (ref 38–126)
Anion gap: 5 (ref 5–15)
BUN: 10 mg/dL (ref 6–20)
CO2: 31 mmol/L (ref 22–32)
Calcium: 7.7 mg/dL — ABNORMAL LOW (ref 8.9–10.3)
Chloride: 106 mmol/L (ref 98–111)
Creatinine, Ser: 0.46 mg/dL (ref 0.44–1.00)
GFR calc Af Amer: >60 mL/min (ref >60)
GFR calc non Af Amer: >60 mL/min (ref >60)
Glucose, Bld: 218 mg/dL — ABNORMAL HIGH (ref 70–99)
Potassium: 3 mmol/L — ABNORMAL LOW (ref 3.5–5.1)
Sodium: 142 mmol/L (ref 135–145)
Total Bilirubin: 0.7 mg/dL (ref 0.3–1.2)
Total Protein: 6.2 g/dL — ABNORMAL LOW (ref 6.5–8.1)

## 2019-03-26 LAB — BPAM FFP
Blood Product Expiration Date: 202006042359
Blood Product Expiration Date: 202006052359
Unit Type and Rh: 6200
Unit Type and Rh: 6200

## 2019-03-26 LAB — HEMOGLOBIN AND HEMATOCRIT, BLOOD
HCT: 25.7 % — ABNORMAL LOW (ref 36.0–46.0)
Hemoglobin: 7.7 g/dL — ABNORMAL LOW (ref 12.0–15.0)

## 2019-03-26 LAB — GLUCOSE, CAPILLARY
Glucose-Capillary: 206 mg/dL — ABNORMAL HIGH (ref 70–99)
Glucose-Capillary: 215 mg/dL — ABNORMAL HIGH (ref 70–99)
Glucose-Capillary: 226 mg/dL — ABNORMAL HIGH (ref 70–99)
Glucose-Capillary: 241 mg/dL — ABNORMAL HIGH (ref 70–99)
Glucose-Capillary: 249 mg/dL — ABNORMAL HIGH (ref 70–99)
Glucose-Capillary: 258 mg/dL — ABNORMAL HIGH (ref 70–99)
Glucose-Capillary: 272 mg/dL — ABNORMAL HIGH (ref 70–99)

## 2019-03-26 LAB — PHOSPHORUS: Phosphorus: 1.9 mg/dL — ABNORMAL LOW (ref 2.5–4.6)

## 2019-03-26 LAB — PROTIME-INR
INR: 1.3 — ABNORMAL HIGH (ref 0.8–1.2)
Prothrombin Time: 15.6 seconds — ABNORMAL HIGH (ref 11.4–15.2)

## 2019-03-26 LAB — PREPARE RBC (CROSSMATCH)

## 2019-03-26 LAB — MAGNESIUM: Magnesium: 1.9 mg/dL (ref 1.7–2.4)

## 2019-03-26 MED ORDER — POTASSIUM PHOSPHATES 15 MMOLE/5ML IV SOLN
30.0000 mmol | Freq: Once | INTRAVENOUS | Status: AC
  Administered 2019-03-26: 05:00:00 30 mmol via INTRAVENOUS
  Filled 2019-03-26: qty 10

## 2019-03-26 MED ORDER — SODIUM CHLORIDE 0.9% IV SOLUTION
Freq: Once | INTRAVENOUS | Status: AC
  Administered 2019-03-26: 07:00:00 via INTRAVENOUS

## 2019-03-26 MED ORDER — POTASSIUM CHLORIDE 10 MEQ/100ML IV SOLN
10.0000 meq | INTRAVENOUS | Status: AC
  Administered 2019-03-26 (×4): 10 meq via INTRAVENOUS
  Filled 2019-03-26 (×4): qty 100

## 2019-03-26 MED ORDER — CHLORHEXIDINE GLUCONATE CLOTH 2 % EX PADS
6.0000 | MEDICATED_PAD | Freq: Every day | CUTANEOUS | Status: DC
  Administered 2019-03-26: 6 via TOPICAL

## 2019-03-26 MED ORDER — INSULIN DETEMIR 100 UNIT/ML ~~LOC~~ SOLN
6.0000 [IU] | Freq: Two times a day (BID) | SUBCUTANEOUS | Status: DC
  Administered 2019-03-26 – 2019-03-29 (×6): 6 [IU] via SUBCUTANEOUS
  Filled 2019-03-26 (×7): qty 0.06

## 2019-03-26 NOTE — Progress Notes (Signed)
Physical Therapy Treatment Patient Details Name: Angela Castillo MRN: [id number] DOB: 03-25-1976 Today's Date: 03/26/2019    History of Present Illness 43 year old female admitted for GIB.  s/p intubation.  PMH:  shoulder and back pain, headaches    PT Comments    Pt very cooperative and with marked increase in activity tolerance - ambulating 200+ feet with increased time and chair follow for safety.  Follow Up Recommendations  No PT follow up     Equipment Recommendations  None recommended by PT    Recommendations for Other Services OT consult     Precautions / Restrictions Precautions Precautions: Fall Restrictions Weight Bearing Restrictions: No    Mobility  Bed Mobility Overal bed mobility: Needs Assistance Bed Mobility: Supine to Sit     Supine to sit: Min assist;HOB elevated     General bed mobility comments: Increased time with assist for trunk and cues for sequencing; use of bedrail to self assist  Transfers Overall transfer level: Needs assistance Equipment used: Rolling walker (2 wheeled) Transfers: Sit to/from Stand Sit to Stand: Min assist;+2 safety/equipment Stand pivot transfers: Min assist;+2 safety/equipment       General transfer comment: light assistance to stand and steady   Ambulation/Gait Ambulation/Gait assistance: Min assist;+2 safety/equipment Gait Distance (Feet): 200 Feet Assistive device: Rolling walker (2 wheeled) Gait Pattern/deviations: Step-to pattern;Step-through pattern;Decreased step length - right;Decreased step length - left;Shuffle;Trunk flexed Gait velocity: decr   General Gait Details: cues for posture and position from RW;    Optometrist    Modified Rankin (Stroke Patients Only)       Balance Overall balance assessment: Mild deficits observed, not formally tested                                          Cognition Arousal/Alertness:  Awake/alert Behavior During Therapy: WFL for tasks assessed/performed Overall Cognitive Status: Within Functional Limits for tasks assessed                                        Exercises      General Comments        Pertinent Vitals/Pain Pain Assessment: Faces Pain Score: 2  Faces Pain Scale: Hurts a little bit Pain Location: stomach when coughing; throat sore Pain Descriptors / Indicators: Sore Pain Intervention(s): Limited activity within patient's tolerance;Monitored during session    Home Living                      Prior Function            PT Goals (current goals can now be found in the care plan section) Acute Rehab PT Goals Patient Stated Goal: return to PLOF PT Goal Formulation: With patient Time For Goal Achievement: 04/08/19 Potential to Achieve Goals: Good Progress towards PT goals: Progressing toward goals    Frequency    Min 3X/week      PT Plan Current plan remains appropriate    Co-evaluation              AM-PAC PT "6 Clicks" Mobility   Outcome Measure  Help needed turning from your back to your side while in a flat bed without using bedrails?:  A Lot Help needed moving from lying on your back to sitting on the side of a flat bed without using bedrails?: A Lot Help needed moving to and from a bed to a chair (including a wheelchair)?: A Little Help needed standing up from a chair using your arms (e.g., wheelchair or bedside chair)?: A Little Help needed to walk in hospital room?: A Little Help needed climbing 3-5 steps with a railing? : A Lot 6 Click Score: 15    End of Session Equipment Utilized During Treatment: Gait belt Activity Tolerance: Patient tolerated treatment well Patient left: in chair Nurse Communication: Mobility status PT Visit Diagnosis: Muscle weakness (generalized) (M62.81);Unsteadiness on feet (R26.81);Difficulty in walking, not elsewhere classified (R26.2);Pain;Other symptoms and signs  involving the nervous system (R29.898)     Time: [id number] PT Time Calculation (min) (ACUTE ONLY): 25 min  Charges:  $Gait Training: 23-37 mins $Therapeutic Activity: 8-22 mins                     Angela Castillo PT Acute Rehabilitation Services Pager [phone] Office [phone]    Angela Castillo 03/26/2019, 11:18 AM

## 2019-03-26 NOTE — Progress Notes (Signed)
Per Marland Mcalpine MD, verbal orders to remove central cath, left IJ. This RN will remove and document.

## 2019-03-26 NOTE — Progress Notes (Signed)
[facility] Progress Note  Angela Castillo 43 y.o. 1976/10/30   Subjective: Having heartburn. Black stool overnight.  Objective: Vital signs: Vitals:   03/26/19 1000 03/26/19 1100  BP: (!) 152/92 (!) 157/96  Pulse: 87 87  Resp: 16 19  Temp:    SpO2: 93% 95%  T 99.3  Physical Exam: Gen: lethargic, obese, mild acute distress  HEENT: anicteric sclera CV: RRR Chest: CTA B Abd: epigastric tenderness with minimal guarding, soft, nondistended, +BS Ext: no edema  Lab Results: Recent Labs    03/25/19 0347 03/26/19 0400  NA 146* 142  K 3.1* 3.0*  CL 114* 106  CO2 26 31  GLUCOSE 223* 218*  BUN 15 10  CREATININE 0.52 0.46  CALCIUM 7.6* 7.7*  MG 2.1 1.9  PHOS 1.7* 1.9*   Recent Labs    03/25/19 0347 03/26/19 0400  AST 49* 30  ALT 86* 60*  ALKPHOS 70 70  BILITOT 0.8 0.7  PROT 6.3* 6.2*  ALBUMIN 2.7* 2.7*   Recent Labs    03/24/19 1351  03/25/19 0347 03/26/19 0400 03/26/19 1052  WBC 9.3   < > 11.7* 7.5  --   NEUTROABS 7.4  --   --   --   --   HGB 7.0*   < > 7.1* 6.4* 7.7*  HCT 22.5*   < > 23.6* 21.3* 25.7*  MCV 93.8   < > 95.5 95.5  --   PLT 89*   < > 106* 84*  --    < > = values in this interval not displayed.      Assessment/Plan: Likely NASH cirrhosis (Hep B, C NEGATIVE) - S/P variceal bleed with band ligation X 14. Black stools overnight and drop in Hgb to 6.4 that has improved to 7.7 following 1 U PRBCs. Continue Protonix and Octreotide drips. Full liquids ok. Supportive care. Updated husband by phone. Dr. Paulita Fujita will f/u tomorrow.   Angela Castillo 03/26/2019, 11:42 AM  Questions please call [phone] ID: Angela Castillo, female   DOB: [birth year], 43 y.o.   MRN: [id number]

## 2019-03-26 NOTE — Progress Notes (Signed)
Patient has blood in her urine. It is believed by nursing staff patient is on her period as her last period was 02/16/2019. No blood coming from rectum. Will watch patient closely. MD paged to be made aware. Repeat H&H scheduled for 11:00 this AM.

## 2019-03-26 NOTE — Progress Notes (Addendum)
Inpatient Diabetes Program Recommendations  AACE/ADA: New Consensus Statement on Inpatient Glycemic Control (2015)  Target Ranges:  Prepandial:   less than 140 mg/dL      Peak postprandial:   less than 180 mg/dL (1-2 hours)      Critically ill patients:  140 - 180 mg/dL   Lab Results  Component Value Date   GLUCAP 226 (H) 03/26/2019   HGBA1C 9.5 (H) 03/21/2019    Review of Glycemic Control Results for OMESHA, DESHOTEL (MRN [id number]) as of 03/26/2019 11:13  Ref. Range 03/25/2019 15:02 03/25/2019 19:21 03/26/2019 00:17 03/26/2019 03:14 03/26/2019 07:44  Glucose-Capillary Latest Ref Range: 70 - 99 mg/dL 579 (H) 038 (H) 333 (H) 206 (H) 226 (H)   Inpatient Diabetes Program Recommendations:   Consider Levemir 6 units bid (0.1 units/kg x 114.3kg = 11.4 units) Spoke with patient briefly. Patient states she has never had elevated blood glucose or diagnosis of diabetes in the past. Will follow during hospitalization.  Thank you, Billy Fischer. Theodor Mustin, RN, MSN, CDE  Diabetes Coordinator Inpatient Glycemic Control Team Team Pager [phone] (8am-5pm) 03/26/2019 11:14 AM

## 2019-03-26 NOTE — Progress Notes (Signed)
eLink Physician-Brief Progress Note Patient Name: Angela Castillo DOB: 21-Dec-1975 MRN: [id number]   Date of Service  03/26/2019  HPI/Events of Note  Notified of abnormal electrolytes and anemia. Hgb 6.4 <-- 7.1 K 3 BUN/Crea 10/0.46 Phos 1.9  eICU Interventions  Transfuse 1 unit pRBC KPhos reordered     Intervention Category Minor Interventions: Electrolytes abnormality - evaluation and management;Other:  Larinda Buttery 03/26/2019, 4:51 AM

## 2019-03-26 NOTE — Progress Notes (Signed)
CRITICAL VALUE ALERT  Critical Value:  Hgb 6.4  Date & Time Notied:  03/26/19 at 0425  Provider Notified: E-link MD  Orders Received/Actions taken: Awaiting further orders

## 2019-03-26 NOTE — Progress Notes (Addendum)
PROGRESS NOTE    Angela Castillo  TGG:[id number] DOB: 1976-01-22 DOA: 03/21/2019 PCP: Patient, No Pcp Per   Brief Narrative:  Patient is a 43 year old morbidly obese Caucasian female with a past medical history significant for but not limited to suspected cirrhosis secondary to [facility] who presented with a recent upper GI bleeding and underwent gastric variceal banding on 03/22/2018.  She was intubated for airway protection and extubated on 03/24/2019.  Subsequently it felt that she aspirated during her intubation and she spiked a temperature of 102 and sputum culture grew out gram-positive cocci so she was placed on IV Unasyn.  She became hypotensive and was placed on Neo-Synephrine but this is been weaned off and was given multiple fluid boluses.  She is 5 L positive and diuresis was initiated and now she is off pressors.  She received IV Lasix 40 g twice daily yesterday and this is been stopped.  Gastroenterology is following and patient remains on octreotide drip along with a Protonix drip and hemoglobin/hematocrit dropped again today surgery transfuse 1 unit PRBCs again.  Since she is doing better this afternoon she is transferred to the medical floor with telemetry   Assessment & Plan:   Principal Problem:   GI bleed Active Problems:   Anemia, iron deficiency   Essential hypertension   Obesity (BMI 30-39.9)   Esophageal reflux   Acute respiratory insufficiency   Respiratory failure (HCC)   Shock circulatory (HCC)  Acute Upper GI bleeding secondary to variceal bleed with gastric banding x14 in the setting of NASH and Cirrhosis; Acute Blood Loss Anemia -Large Esophageal Varices Noted on EGD -Has had banding 2 separate occasions during the hospitalization -Hemoglobin/hematocrit went from 7.0/22.5 -> 7.3/24.0 -> 7.1/23.6 -> 6.4/21.3 -> 7.7/25.7 (after 1 unit of pRBC) -Gastroenterology is following is following and continues to have black stools and hemoglobin dropped to 6.4 -Transfuse  1 unit PRBCs overnight and hemoglobin/hematocrit is improved. -Patient is status post 4 units of PRBCs total -Continue with Protonix and octreotide drips -GI recommending full liquid diet and supportive care -Continue to monitor for signs and symptoms of bleeding -Repeat CBC in the a.m. -Patient is hemodynamically stable so we will transfer to the medical floor with telemetry -Repeat CBC in AM   Acute Respiratory Failure status post extubation for banding procedure -She is improved and now off of oxygen as after she is extubated -She was also diuresed with IV Lasix twice daily yesterday  -Will need to continue monitor respiratory status carefully -Repeat CXR in AM  -New with albuterol 2.5 mg nebs every 2 PRN for wheezing -Repeat chest x-ray in the a.m.  Aspiration Pneumonia -Suspected to have aspirated during intubation as she had a temperature of 102 -C/w Pulmonary Toilet -Sputum cultures is growing gram-positive cocci  And showed Moderate Staphylococcus Aureus so she was placed on IV Unasyn -Continue IV Unasyn for now -Continue Albuterol 2.5 mg nebs every 2 hours PRN for wheezing -Swallow Evaluation done and recommending Thin Liquids -Chest x-ray on 03/25/19 showed "Cardiac shadow is mildly enlarged but stable. Left jugular central line is noted in satisfactory position. The endotracheal tube is been removed. No focal infiltrate or sizable effusion is seen. No bony abnormality is noted." -Blood Cx x2 showed NGTD -Repeat CXR in AM   HTN and recent Hypotension -No longer on Neo-synephrine -Holding home Cozaar  Hyperglycemia in the setting of Uncontrolled Diabetes Mellitus Type 2 -Patient's CBG's ranging from 201-299 -Added Levemir 6 units BID -C/w Sensitive Novolog SSI q4h -Checked  HbA1c and was 9.5 -Diabetes Education Coordinator Consulted and Following and appreciate Recommendations  Hematuria in the Setting of Menstruation vs. ? E Coli UTI -We will obtain urinalysis  -Patient did have a urine culture done on 03/24/2019 which did show E. coli with 50,000 colonies that was pansensitive but her Urine was Catheterized -We will repeat a urinalysis currently and patient is already on IV Unasyn so should cover if she did have urinary tract infection -We will continue to monitor for signs and symptoms of bleeding -If persists will consult urology but patient believes that she may be on her period as her last cycle was 02/16/2019 -Repeat Urinalysis  -Continue to Monitor Closely  Liver Cirrhosis likely from NASH in the setting of Morbid Obesity -Abnormal LFTs -GI Following -C/w Lactulose 10 grams TID -Discussed with Gastroenterology and recommends Propanolol or Nadolol eventually and likely can be started just prior to D/C or as an outpatient -Patient's LFTs are trending down and AST went from 78 -> 49 -> 30 -ALT went from 93 -> 86 -> 60 -Continue to Monitor and Trend LFTs -Repeat CMP in AM   Hypokalemia -Patient potassium is morning was 3.0 -Replete with IV KCl 40 mEq and with IV K-Phos 30 mmol -Continue monitor replete as necessary -CMP in a.m.  Hypophosphatemia -Patient's phosphorus levels morning was 1.9 -Replete with IV K-Phos 30 mmol Continue monitor replete as necessary -Repeat phosphorus level in a.m.  Morbid Obesity -Estimated body mass index is 45.97 kg/m as calculated from the following:   Height as of this encounter: 5' 3"  (1.6 m).   Weight as of this encounter: 117.7 kg.  -Weight Loss and Dietary Counseling given  -Nutritionist on Board   DVT prophylaxis: SCDs given GI Bleeding  Code Status: FULL CODE Family Communication: No family present at bedside  Disposition Plan: Transfer to the Medical Floor with Telemetry  Consultants:   PCCM Transfer  Gastroenterology   Procedures:  ETT 5/18 -> 5/20  EGD 03/23/19 Findings:      Three columns of oozing large (> 5 mm) varices were found in the lower       third of the esophagus,.  Stigmata of recent bleeding were evident and       red wale signs were present. Six bands were successfully placed with       incomplete eradication of varices. Bleeding had stopped at the end of       the procedure.      Due to fear of dislodging previouslyplaced bands endoscope was not       advanced distal to lowest placed bands. A large protuberant nipple sign       was noted and it was banded. Impression:               - Bleeding large (> 5 mm) esophageal varices.                            Incompletely eradicated. Banded.                           - No specimens collected. Moderate Sedation:      See the other procedure note for documentation of moderate sedation with       intraservice time.  Antimicrobials:  Anti-infectives (From admission, onward)   Start     Dose/Rate Route Frequency Ordered Stop   03/24/19 1000  Ampicillin-Sulbactam (UNASYN) 3  g in sodium chloride 0.9 % 100 mL IVPB     3 g 200 mL/hr over 30 Minutes Intravenous Every 6 hours 03/24/19 0830       Subjective: Seen and examined at bedside and states that she was having some heartburn.  Ended up having dark stools and hemoglobin dropped again.  GI evaluated and recommended full liquid diet and continuing octreotide and Protonix drip.  Patient is hemodynamically stable at this time and improved.  Nursing paged and reported the patient was having some blood in her urine but this is unclear if this is from her full cycle versus a suspected infection.  We will repeat urinalysis at this time.  Patient had no other concerns or complaints at this time.   Objective: Vitals:   03/26/19 1500 03/26/19 1508 03/26/19 1600 03/26/19 1700  BP:  (!) 149/93 (!) 165/93 (!) 170/93  Pulse:  94 88 89  Resp:  18 19 20   Temp:      TempSrc:      SpO2: 95% 95% 95% 95%  Weight:      Height:        Intake/Output Summary (Last 24 hours) at 03/26/2019 1713 Last data filed at 03/26/2019 1700 Gross per 24 hour  Intake 2911.55 ml  Output  2050 ml  Net 861.55 ml   Filed Weights   03/23/19 0325 03/25/19 0404 03/26/19 0352  Weight: 113.9 kg 114.3 kg 117.7 kg   Examination: Physical Exam:  Constitutional: WN/WD morbidly obese Caucasian female NAD and appears calm but slightly uncomfortable Eyes: Lids and conjunctivae normal, sclerae anicteric  ENMT: External Ears, Nose appear normal. Grossly normal hearing.  Neck: Appears normal, supple, no cervical masses, normal ROM, no appreciable thyromegaly; no JVD Respiratory: Diminished to auscultation bilaterally, no wheezing, rales, rhonchi or crackles.  Cardiovascular: RRR, no murmurs / rubs / gallops. S1 and S2 auscultated. Trace extremity edema.  Abdomen: Soft, mildly tender, Distended 2/2 to Body habitus. No masses palpated. No appreciable hepatosplenomegaly. Bowel sounds positive x4.  GU: Deferred. Musculoskeletal: No clubbing / cyanosis of digits/nails. No joint deformity upper and lower extremities. Skin: No rashes, lesions, ulcers on a limited skin evaluation. No induration; Warm and dry. Has multiple skin tattoos Neurologic: CN 2-12 grossly intact with no focal deficits. Romberg sign and cerebellar reflexes not assessed.  Psychiatric: Normal judgment and insight. Alert and oriented x 3. Normal mood and appropriate affect.   Data Reviewed: I have personally reviewed following labs and imaging studies  CBC: Recent Labs  Lab 03/24/19 0414 03/24/19 1351 03/24/19 2100 03/25/19 0347 03/26/19 0400 03/26/19 1052  WBC 12.5* 9.3 12.5* 11.7* 7.5  --   NEUTROABS  --  7.4  --   --   --   --   HGB 7.2* 7.0* 7.3* 7.1* 6.4* 7.7*  HCT 23.2* 22.5* 24.0* 23.6* 21.3* 25.7*  MCV 93.2 93.8 94.9 95.5 95.5  --   PLT 100* 89* 116* 106* 84*  --    Basic Metabolic Panel: Recent Labs  Lab 03/21/19 1257  03/23/19 0521 03/24/19 0414 03/24/19 0913 03/25/19 0032 03/25/19 0347 03/26/19 0400  NA  --    < > 142 144  --  146* 146* 142  K  --    < > 3.9 3.7  --  3.2* 3.1* 3.0*  CL  --     < > 112* 117*  --  115* 114* 106  CO2  --    < > 22 24  --  26 26  31  GLUCOSE  --    < > 218* 228*  --  217* 223* 218*  BUN  --    < > 16 18  --  16 15 10   CREATININE  --    < > 0.60 0.53  --  0.54 0.52 0.46  CALCIUM 8.0*   < > 7.6* 7.5*  --  7.6* 7.6* 7.7*  MG 1.7  --   --   --  2.0  --  2.1 1.9  PHOS 2.8  --   --   --   --   --  1.7* 1.9*   < > = values in this interval not displayed.   GFR: Estimated Creatinine Clearance: 113.5 mL/min (by C-G formula based on SCr of 0.46 mg/dL). Liver Function Tests: Recent Labs  Lab 03/21/19 0757 03/24/19 0414 03/25/19 0347 03/26/19 0400  AST 49* 78* 49* 30  ALT 50* 93* 86* 60*  ALKPHOS 133* 68 70 70  BILITOT 0.7 0.9 0.8 0.7  PROT 7.3 5.8* 6.3* 6.2*  ALBUMIN 3.3* 2.6* 2.7* 2.7*   Recent Labs  Lab 03/21/19 0756  LIPASE 35   No results for input(s): AMMONIA in the last 168 hours. Coagulation Profile: Recent Labs  Lab 03/21/19 1257 03/22/19 0316 03/26/19 0400  INR 1.3* 1.3* 1.3*   Cardiac Enzymes: Recent Labs  Lab 03/22/19 1439 03/24/19 1011  TROPONINI <0.03 <0.03   BNP (last 3 results) No results for input(s): PROBNP in the last 8760 hours. HbA1C: No results for input(s): HGBA1C in the last 72 hours. CBG: Recent Labs  Lab 03/26/19 0017 03/26/19 0314 03/26/19 0744 03/26/19 1135 03/26/19 1616  GLUCAP 249* 206* 226* 272* 258*   Lipid Profile: No results for input(s): CHOL, HDL, LDLCALC, TRIG, CHOLHDL, LDLDIRECT in the last 72 hours. Thyroid Function Tests: No results for input(s): TSH, T4TOTAL, FREET4, T3FREE, THYROIDAB in the last 72 hours. Anemia Panel: No results for input(s): VITAMINB12, FOLATE, FERRITIN, TIBC, IRON, RETICCTPCT in the last 72 hours. Sepsis Labs: Recent Labs  Lab 03/25/19 0347  LATICACIDVEN 1.0    Recent Results (from the past 240 hour(s))  SARS Coronavirus 2 (CEPHEID - Performed in [facility] hospital lab), Hosp Order     Status: None   Collection Time: 03/21/19 10:55 AM  Result Value  Ref Range Status   SARS Coronavirus 2 NEGATIVE NEGATIVE Final    Comment: (NOTE) If result is NEGATIVE SARS-CoV-2 target nucleic acids are NOT DETECTED. The SARS-CoV-2 RNA is generally detectable in upper and lower  respiratory specimens during the acute phase of infection. The lowest  concentration of SARS-CoV-2 viral copies this assay can detect is 250  copies / mL. A negative result does not preclude SARS-CoV-2 infection  and should not be used as the sole basis for treatment or other  patient management decisions.  A negative result may occur with  improper specimen collection / handling, submission of specimen other  than nasopharyngeal swab, presence of viral mutation(s) within the  areas targeted by this assay, and inadequate number of viral copies  (<250 copies / mL). A negative result must be combined with clinical  observations, patient history, and epidemiological information. If result is POSITIVE SARS-CoV-2 target nucleic acids are DETECTED. The SARS-CoV-2 RNA is generally detectable in upper and lower  respiratory specimens dur ing the acute phase of infection.  Positive  results are indicative of active infection with SARS-CoV-2.  Clinical  correlation with patient history and other diagnostic information is  necessary to determine  patient infection status.  Positive results do  not rule out bacterial infection or co-infection with other viruses. If result is PRESUMPTIVE POSTIVE SARS-CoV-2 nucleic acids MAY BE PRESENT.   A presumptive positive result was obtained on the submitted specimen  and confirmed on repeat testing.  While 2019 novel coronavirus  (SARS-CoV-2) nucleic acids may be present in the submitted sample  additional confirmatory testing may be necessary for epidemiological  and / or clinical management purposes  to differentiate between  SARS-CoV-2 and other Sarbecovirus currently known to infect humans.  If clinically indicated additional testing with an  alternate test  methodology [phone]) is advised. The SARS-CoV-2 RNA is generally  detectable in upper and lower respiratory sp ecimens during the acute  phase of infection. The expected result is Negative. Fact Sheet for Patients:  StrictlyIdeas.no Fact Sheet for Healthcare Providers: BankingDealers.co.za This test is not yet approved or cleared by the [place] FDA and has been authorized for detection and/or diagnosis of SARS-CoV-2 by FDA under an Emergency Use Authorization (EUA).  This EUA will remain in effect (meaning this test can be used) for the duration of the COVID-19 declaration under Section 564(b)(1) of the Act, 21 U.S.C. section 360bbb-3(b)(1), unless the authorization is terminated or revoked sooner. Performed at [facility], [place] [address], [place], [place] [address]   MRSA PCR Screening     Status: None   Collection Time: 03/22/19  4:56 PM  Result Value Ref Range Status   MRSA by PCR NEGATIVE NEGATIVE Final    Comment:        The GeneXpert MRSA Assay (FDA approved for NASAL specimens only), is one component of a comprehensive MRSA colonization surveillance program. It is not intended to diagnose MRSA infection nor to guide or monitor treatment for MRSA infections. Performed at [facility], [place] [address], [place], [place] [address]   Culture, Urine     Status: Abnormal   Collection Time: 03/24/19  9:12 AM  Result Value Ref Range Status   Specimen Description   Final    URINE, CATHETERIZED Performed at [place] [address], [place], [place] [address]    Special Requests   Final    Normal Performed at [facility], [place] [address], [place], [place] [address]    Culture 50,000 COLONIES/mL ESCHERICHIA COLI (A)  Final   Report Status 03/26/2019 FINAL  Final   Organism ID, Bacteria ESCHERICHIA COLI (A)  Final       Susceptibility   Escherichia coli - MIC*    AMPICILLIN <=2 SENSITIVE Sensitive     CEFAZOLIN <=4 SENSITIVE Sensitive     CEFTRIAXONE <=1 SENSITIVE Sensitive     CIPROFLOXACIN <=0.25 SENSITIVE Sensitive     GENTAMICIN <=1 SENSITIVE Sensitive     IMIPENEM <=0.25 SENSITIVE Sensitive     NITROFURANTOIN <=16 SENSITIVE Sensitive     TRIMETH/SULFA <=20 SENSITIVE Sensitive     AMPICILLIN/SULBACTAM <=2 SENSITIVE Sensitive     PIP/TAZO <=4 SENSITIVE Sensitive     Extended ESBL NEGATIVE Sensitive     * 50,000 COLONIES/mL ESCHERICHIA COLI  Culture, respiratory (non-expectorated)     Status: None (Preliminary result)   Collection Time: 03/24/19 10:00 AM  Result Value Ref Range Status   Specimen Description   Final    TRACHEAL ASPIRATE Performed at [place] [address], [place], [place] [address]    Special Requests   Final    Normal Performed at [facility]  [place] [address] [place], [place] [address]    Gram Stain   Final    RARE WBC PRESENT,BOTH PMN AND MONONUCLEAR MODERATE GRAM POSITIVE COCCI FEW GRAM NEGATIVE RODS Performed at [facility], [place] [address], [place], [place] [address]    Culture MODERATE STAPHYLOCOCCUS AUREUS  Final   Report Status PENDING  Incomplete  Culture, blood (routine x 2)     Status: None (Preliminary result)   Collection Time: 03/24/19 10:39 AM  Result Value Ref Range Status   Specimen Description   Final    BLOOD RIGHT ARM Performed at [place] [address], [place], [place] [address]    Special Requests   Final    BOTTLES DRAWN AEROBIC ONLY Blood Culture adequate volume   Culture   Final    NO GROWTH 2 DAYS Performed at [facility], [place] [address] [place], [place] [address]    Report Status PENDING  Incomplete  Culture, blood (routine x 2)     Status: None (Preliminary result)   Collection Time: 03/24/19 10:48 AM  Result Value Ref Range Status   Specimen  Description   Final    BLOOD RIGHT WRIST Performed at [place] [address] [place], [place] [address]    Special Requests   Final    BOTTLES DRAWN AEROBIC ONLY Blood Culture adequate volume   Culture   Final    NO GROWTH 2 DAYS Performed at [facility], [place] [address] [place], [place] [address]    Report Status PENDING  Incomplete   Radiology Studies: Dg Chest Port 1 View  Result Date: 03/25/2019 CLINICAL DATA:  Respiratory failure EXAM: PORTABLE CHEST 1 VIEW COMPARISON:  03/24/2019 FINDINGS: Cardiac shadow is mildly enlarged but stable. Left jugular central line is noted in satisfactory position. The endotracheal tube is been removed. No focal infiltrate or sizable effusion is seen. No bony abnormality is noted. IMPRESSION: No acute abnormality seen. Electronically Signed   By: Inez Catalina M.D.   On: 03/25/2019 07:44   Scheduled Meds: . Chlorhexidine Gluconate Cloth  6 each Topical Daily  . ferrous sulfate  325 mg Oral Q breakfast  . insulin aspart  0-9 Units Subcutaneous Q4H  . lactulose  10 g Oral TID  . sodium chloride flush  10-40 mL Intracatheter Q12H  . sodium chloride flush  3 mL Intravenous Q12H   Continuous Infusions: . ampicillin-sulbactam (UNASYN) IV Stopped (03/26/19 1537)  . octreotide  (SANDOSTATIN)    IV infusion Stopped (03/26/19 1601)  . pantoprozole (PROTONIX) infusion 8 mg/hr (03/26/19 1700)    LOS: 5 days    Kerney Elbe, DO [facility] PAGER is on [place]  If 7PM-7AM, please contact night-coverage [email or web address] Password [facility] 03/26/2019, 5:13 PM

## 2019-03-26 NOTE — Progress Notes (Signed)
Physical Therapy Treatment Patient Details Name: Angela Castillo MRN: [id number] DOB: 10/28/1976 Today's Date: 03/26/2019    History of Present Illness 43 year old female admitted for GIB.  s/p intubation.  PMH:  shoulder and back pain, headaches    PT Comments    Pt cooperative but requiring increased time for all tasks.  Session ltd by pt need for [facility]  Follow Up Recommendations  No PT follow up     Equipment Recommendations  None recommended by PT    Recommendations for Other Services OT consult     Precautions / Restrictions Precautions Precautions: Fall Restrictions Weight Bearing Restrictions: No    Mobility  Bed Mobility Overal bed mobility: Needs Assistance Bed Mobility: Supine to Sit     Supine to sit: Min assist;HOB elevated     General bed mobility comments: Increased time with assist for trunk and cues for sequencing; use of bedrail to self assist  Transfers Overall transfer level: Needs assistance Equipment used: Rolling walker (2 wheeled) Transfers: Sit to/from Stand Sit to Stand: Min assist;+2 safety/equipment Stand pivot transfers: Min assist;+2 safety/equipment       General transfer comment: light assistance to stand and steady and to pivot to [facility] using RW  Ambulation/Gait                 Stairs             Wheelchair Mobility    Modified Rankin (Stroke Patients Only)       Balance Overall balance assessment: Mild deficits observed, not formally tested                                          Cognition Arousal/Alertness: Awake/alert Behavior During Therapy: WFL for tasks assessed/performed Overall Cognitive Status: Within Functional Limits for tasks assessed                                        Exercises      General Comments        Pertinent Vitals/Pain Pain Assessment: Faces Faces Pain Scale: Hurts a little bit Pain Location: stomach when coughing; throat  sore Pain Descriptors / Indicators: Sore Pain Intervention(s): Limited activity within patient's tolerance;Monitored during session    Home Living                      Prior Function            PT Goals (current goals can now be found in the care plan section) Acute Rehab PT Goals Patient Stated Goal: return to PLOF PT Goal Formulation: With patient Time For Goal Achievement: 04/08/19 Potential to Achieve Goals: Good Progress towards PT goals: Progressing toward goals    Frequency    Min 3X/week      PT Plan Current plan remains appropriate    Co-evaluation              AM-PAC PT "6 Clicks" Mobility   Outcome Measure  Help needed turning from your back to your side while in a flat bed without using bedrails?: A Lot Help needed moving from lying on your back to sitting on the side of a flat bed without using bedrails?: A Lot Help needed moving to and from a bed to a chair (  including a wheelchair)?: A Little Help needed standing up from a chair using your arms (e.g., wheelchair or bedside chair)?: A Little Help needed to walk in hospital room?: A Little Help needed climbing 3-5 steps with a railing? : A Lot 6 Click Score: 15    End of Session   Activity Tolerance: Patient tolerated treatment well Patient left: Other (comment)(BSC)   PT Visit Diagnosis: Muscle weakness (generalized) (M62.81);Unsteadiness on feet (R26.81);Difficulty in walking, not elsewhere classified (R26.2);Pain;Other symptoms and signs involving the nervous system (R29.898)     Time: [id number] PT Time Calculation (min) (ACUTE ONLY): 11 min  Charges:  $Therapeutic Activity: 8-22 mins                     Mauro KaufmannHunter Babygirl Trager PT Acute Rehabilitation Services Pager [phone] Office [phone]    Holdyn Poyser 03/26/2019, 11:14 AM

## 2019-03-27 ENCOUNTER — Inpatient Hospital Stay (HOSPITAL_COMMUNITY): Payer: BC Managed Care – PPO

## 2019-03-27 DIAGNOSIS — D696 Thrombocytopenia, unspecified: Secondary | ICD-10-CM

## 2019-03-27 LAB — CBC WITH DIFFERENTIAL/PLATELET
Abs Immature Granulocytes: 0.25 10*3/uL — ABNORMAL HIGH (ref 0.00–0.07)
Basophils Absolute: 0 10*3/uL (ref 0.0–0.1)
Basophils Relative: 1 %
Eosinophils Absolute: 0.1 10*3/uL (ref 0.0–0.5)
Eosinophils Relative: 2 %
HCT: 27.1 % — ABNORMAL LOW (ref 36.0–46.0)
Hemoglobin: 8.4 g/dL — ABNORMAL LOW (ref 12.0–15.0)
Immature Granulocytes: 4 %
Lymphocytes Relative: 25 %
Lymphs Abs: 1.5 10*3/uL (ref 0.7–4.0)
MCH: 29.1 pg (ref 26.0–34.0)
MCHC: 31 g/dL (ref 30.0–36.0)
MCV: 93.8 fL (ref 80.0–100.0)
Monocytes Absolute: 0.4 10*3/uL (ref 0.1–1.0)
Monocytes Relative: 7 %
Neutro Abs: 3.6 10*3/uL (ref 1.7–7.7)
Neutrophils Relative %: 61 %
Platelets: 91 10*3/uL — ABNORMAL LOW (ref 150–400)
RBC: 2.89 MIL/uL — ABNORMAL LOW (ref 3.87–5.11)
RDW: 16.2 % — ABNORMAL HIGH (ref 11.5–15.5)
WBC: 5.8 10*3/uL (ref 4.0–10.5)
nRBC: 0.9 % — ABNORMAL HIGH (ref 0.0–0.2)

## 2019-03-27 LAB — MAGNESIUM: Magnesium: 2.2 mg/dL (ref 1.7–2.4)

## 2019-03-27 LAB — CULTURE, RESPIRATORY W GRAM STAIN: Special Requests: NORMAL

## 2019-03-27 LAB — COMPREHENSIVE METABOLIC PANEL
ALT: 49 U/L — ABNORMAL HIGH (ref 0–44)
AST: 27 U/L (ref 15–41)
Albumin: 2.9 g/dL — ABNORMAL LOW (ref 3.5–5.0)
Alkaline Phosphatase: 84 U/L (ref 38–126)
Anion gap: 6 (ref 5–15)
BUN: 7 mg/dL (ref 6–20)
CO2: 29 mmol/L (ref 22–32)
Calcium: 8.2 mg/dL — ABNORMAL LOW (ref 8.9–10.3)
Chloride: 105 mmol/L (ref 98–111)
Creatinine, Ser: 0.47 mg/dL (ref 0.44–1.00)
GFR calc Af Amer: >60 mL/min (ref >60)
GFR calc non Af Amer: >60 mL/min (ref >60)
Glucose, Bld: 209 mg/dL — ABNORMAL HIGH (ref 70–99)
Potassium: 3.3 mmol/L — ABNORMAL LOW (ref 3.5–5.1)
Sodium: 140 mmol/L (ref 135–145)
Total Bilirubin: 0.7 mg/dL (ref 0.3–1.2)
Total Protein: 6.7 g/dL (ref 6.5–8.1)

## 2019-03-27 LAB — GLUCOSE, CAPILLARY
Glucose-Capillary: 193 mg/dL — ABNORMAL HIGH (ref 70–99)
Glucose-Capillary: 196 mg/dL — ABNORMAL HIGH (ref 70–99)
Glucose-Capillary: 209 mg/dL — ABNORMAL HIGH (ref 70–99)
Glucose-Capillary: 210 mg/dL — ABNORMAL HIGH (ref 70–99)
Glucose-Capillary: 240 mg/dL — ABNORMAL HIGH (ref 70–99)
Glucose-Capillary: 303 mg/dL — ABNORMAL HIGH (ref 70–99)

## 2019-03-27 LAB — PHOSPHORUS: Phosphorus: 3.1 mg/dL (ref 2.5–4.6)

## 2019-03-27 LAB — HEPATITIS C ANTIBODY: HCV Ab: 0.1 s/co ratio (ref 0.0–0.9)

## 2019-03-27 IMAGING — DX PORTABLE CHEST - 1 VIEW
1 series · 1 of 1 positions shown · non-contrast
Comparison: Chest x-ray [DATE].

CLINICAL DATA: 42-year-old female with history of shortness of
breath.

EXAM:
PORTABLE CHEST 1 VIEW

[chest ap]
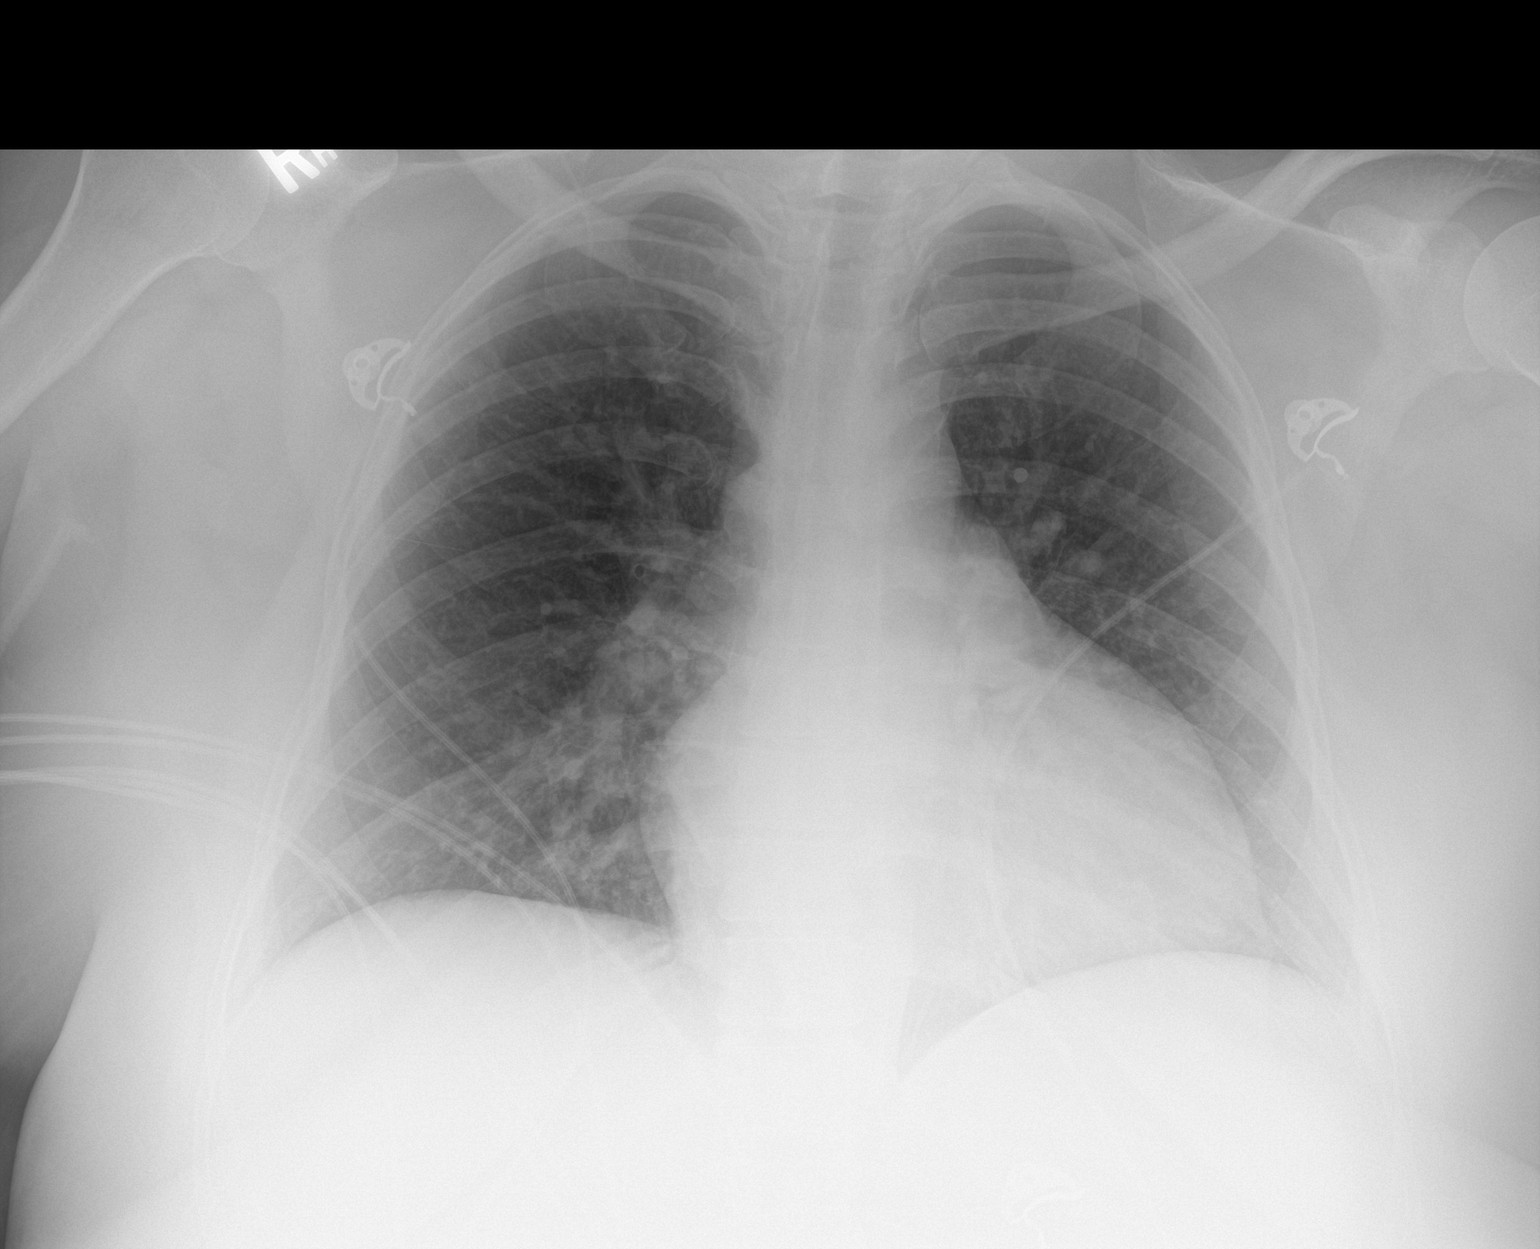

[1 of 1 positions shown; findings below may reference images not displayed]

FINDINGS: Lung volumes are normal. No consolidative airspace disease. No
pleural effusions. No evidence of pulmonary edema. Mild
cardiomegaly. Upper mediastinal contours are within normal limits.
IMPRESSION: 1. No radiographic evidence of acute cardiopulmonary disease.
2. Mild cardiomegaly.

## 2019-03-27 MED ORDER — BENZONATATE 100 MG PO CAPS
100.0000 mg | ORAL_CAPSULE | Freq: Three times a day (TID) | ORAL | Status: DC | PRN
  Administered 2019-03-27 – 2019-03-28 (×4): 100 mg via ORAL
  Filled 2019-03-27 (×4): qty 1

## 2019-03-27 MED ORDER — LOSARTAN POTASSIUM 50 MG PO TABS
100.0000 mg | ORAL_TABLET | Freq: Every day | ORAL | Status: DC
  Administered 2019-03-27 – 2019-03-29 (×3): 100 mg via ORAL
  Filled 2019-03-27 (×3): qty 2

## 2019-03-27 MED ORDER — SODIUM CHLORIDE 0.9 % IV SOLN
2.0000 g | INTRAVENOUS | Status: DC
  Administered 2019-03-27 – 2019-03-28 (×2): 2 g via INTRAVENOUS
  Filled 2019-03-27: qty 20
  Filled 2019-03-27 (×2): qty 2

## 2019-03-27 MED ORDER — POTASSIUM CHLORIDE CRYS ER 20 MEQ PO TBCR
40.0000 meq | EXTENDED_RELEASE_TABLET | Freq: Two times a day (BID) | ORAL | Status: AC
  Administered 2019-03-27 (×2): 40 meq via ORAL
  Filled 2019-03-27 (×2): qty 2

## 2019-03-27 MED ORDER — INSULIN ASPART 100 UNIT/ML ~~LOC~~ SOLN
0.0000 [IU] | Freq: Three times a day (TID) | SUBCUTANEOUS | Status: DC
  Administered 2019-03-27 (×2): 3 [IU] via SUBCUTANEOUS
  Administered 2019-03-27: 7 [IU] via SUBCUTANEOUS
  Administered 2019-03-28 (×3): 2 [IU] via SUBCUTANEOUS
  Administered 2019-03-28: 13:00:00 5 [IU] via SUBCUTANEOUS
  Administered 2019-03-29: 1 [IU] via SUBCUTANEOUS
  Administered 2019-03-29: 2 [IU] via SUBCUTANEOUS

## 2019-03-27 MED ORDER — GUAIFENESIN ER 600 MG PO TB12
1200.0000 mg | ORAL_TABLET | Freq: Two times a day (BID) | ORAL | Status: DC
  Administered 2019-03-27 – 2019-03-29 (×5): 1200 mg via ORAL
  Filled 2019-03-27 (×5): qty 2

## 2019-03-27 NOTE — Progress Notes (Signed)
PROGRESS NOTE    Angela Castillo  JXB:[id number] DOB: [birth year] DOA: 03/21/2019 PCP: Patient, No Pcp Per   Brief Narrative:  Patient is a 43 year old morbidly obese Caucasian female with a past medical history significant for but not limited to suspected cirrhosis secondary to [facility] who presented with a recent upper GI bleeding and underwent gastric variceal banding on 03/22/2018.  She was intubated for airway protection and extubated on 03/24/2019.  Subsequently it felt that she aspirated during her intubation and she spiked a temperature of 102 and sputum culture grew out gram-positive cocci and was Pan-sensitive Staph Aureus so she was placed on IV Unasyn.  She became hypotensive and was placed on Neo-Synephrine but this is been weaned off and was given multiple fluid boluses.  She is 5 L positive and diuresis was initiated and now she is off pressors.  She received IV Lasix 40 g twice daily yesterday and this is been stopped.  Gastroenterology is following and patient remains on octreotide drip along with a Protonix drip and hemoglobin/hematocrit dropped yesterday so transfused 1 unit PRBC.  She was transferred to the medical floor and is improving.  Still having some dark stools so GI is recommending continuing PPI and octreotide drip for another 24 hours and changing to p.o. PPI likely tomorrow.  Diet has been advanced to soft diet.  GI also recommending 7-day course of antibiotics and mobilization.   Assessment & Plan:   Principal Problem:   GI bleed Active Problems:   Anemia, iron deficiency   Essential hypertension   Obesity (BMI 30-39.9)   Esophageal reflux   Acute respiratory insufficiency   Respiratory failure (HCC)   Shock circulatory (HCC)  Acute Upper GI bleeding secondary to variceal bleed with gastric banding x14 in the setting of NASH and Cirrhosis; Acute Blood Loss Anemia -Large Esophageal Varices Noted on EGD -Has had banding 2 separate occasions during the  hospitalization -Hemoglobin/hematocrit went from 7.0/22.5 -> 7.3/24.0 -> 7.1/23.6 -> 6.4/21.3 -> 7.7/25.7 (after 1 unit of pRBC) -> 8.4/27.1 -Gastroenterology is following is following and continues to have black stools and hemoglobin dropped to 6.4 -Transfuse 1 unit PRBCs on the night of 5/21-5/22 and hemoglobin/hematocrit is improved. -Patient is status post 4 units of PRBCs total -Continue with Protonix and Octreotide drips for now per GI recommendations -GI recommending supportive care and diet is being advanced from full liquid diet to a soft diet -Continue to monitor for signs and symptoms of bleeding; patient still having dark stools -GI is recommending a total of 7 days of antibiotics given her liver cirrhosis today is Day 4 and will change IV Unasyn to IV Ceftriaxone for coverage  -Repeat CBC in the a.m. -Patient is hemodynamically stable   Acute Respiratory Failure status post extubation for banding procedure, improved -She is improved and now off of oxygen as after she is extubated -She was also diuresed with IV Lasix twice daily the day before yesterday -Will need to continue monitor respiratory status carefully -Repeat CXR every other day -Continue with albuterol 2.5 mg nebs every 2 PRN for wheezing -Repeat chest x-ray this AM showed "Lung volumes are normal. No consolidative airspace disease. No pleural effusions. No evidence of pulmonary edema. Mild cardiomegaly. Upper mediastinal contours are within normal limits."  Aspiration Pneumonia -Suspected to have aspirated during intubation as she had a temperature of 102 -C/w Pulmonary Toilet -Sputum cultures is growing gram-positive cocci  And showed Moderate Staphylococcus Aureus so she was placed on IV Unasyn; Will change to  IV Ceftriaxone given Pansensitive Staph Aureus and will also need to cover Anaerobes; Today is Day 4/7 of Abx Coverage  -Continue Albuterol 2.5 mg nebs every 2 hours PRN for wheezing -Swallow Evaluation done  and recommending Thin Liquids -Chest x-ray on 03/25/19 showed "Cardiac shadow is mildly enlarged but stable. Left jugular central line is noted in satisfactory position. The endotracheal tube is been removed. No focal infiltrate or sizable effusion is seen. No bony abnormality is noted." -Blood Cx x2 showed NGTD at 3 Days  -Repeat CXR in AM   HTN and recent Hypotension -No longer on Neo-synephrine -Was Holding home Cozaar but will resume now; Takes 100 mg po Daily  -BP was 158/102  Hyperglycemia in the setting of Uncontrolled Diabetes Mellitus Type 2 -Patient's CBG's ranging from 193-303 -Added Levemir 6 units BID -C/w Sensitive Novolog SSI q4h -Checked HbA1c and was 9.5 -Diabetes Education Coordinator Consulted and Following and appreciate Recommendations  Hematuria in the Setting of Menstruation vs. ? E Coli UTI -We will obtain urinalysis -Patient did have a urine culture done on 03/24/2019 which did show E. coli with 50,000 colonies that was pansensitive but her Urine was Catheterized -We will repeat a urinalysis currently and patient is already on IV Unasyn so should cover if she did have urinary tract infection -We will continue to monitor for signs and symptoms of bleeding -If persists will consult urology but patient believes that she may be on her period as her last cycle was 02/16/2019 -Repeat Urinalysis  -Continue to Monitor Closely  Liver Cirrhosis likely from NASH in the setting of Morbid Obesity Abnormal LFTs -GI Following -C/w Lactulose 10 grams TID -Discussed with Gastroenterology and recommends Propanolol or Nadolol eventually and likely can be started just prior to D/C or as an outpatient -Patient's LFTs are trending down and AST went from 78 -> 49 -> 30 -> 27 -ALT went from 93 -> 86 -> 60 -> 49 -GI Recommending Abx for 7 Days total and changed IV Unasyn to IV Ceftriaxone -Continue to Monitor and Trend LFTs -Repeat CMP in AM   Hypokalemia -Patient potassium is  morning was 3.3 -Replete with po KCl 40 mEq BID x2 Doses  -Continue monitor replete as necessary -CMP in a.m.  Hypophosphatemia -Patient's phosphorus levels morning was 3.1 Continue monitor replete as necessary -Repeat phosphorus level in a.m.  Morbid Obesity -Estimated body mass index is 43.78 kg/m as calculated from the following:   Height as of this encounter: 5' 3"  (1.6 m).   Weight as of this encounter: 112.1 kg.  -Weight Loss and Dietary Counseling given  -Nutritionist on Board   Thrombocytopenia -Patient's Platelet Count low in the setting of Cirrhosis and GI Bleeding -Platelet Count went from 116 -> 106 -> 84 -> 91 -Continue to Monitor and Trend -Repeat CBC in AM  DVT prophylaxis: SCDs given GI Bleeding  Code Status: FULL CODE Family Communication: No family present at bedside  Disposition Plan: Home when medically stable and Cleared by GI  Consultants:   PCCM Transfer  Gastroenterology   Procedures:  ETT 5/18 -> 5/20  EGD 03/23/19 Findings:      Three columns of oozing large (> 5 mm) varices were found in the lower       third of the esophagus,. Stigmata of recent bleeding were evident and       red wale signs were present. Six bands were successfully placed with       incomplete eradication of varices. Bleeding had stopped at  the end of       the procedure.      Due to fear of dislodging previouslyplaced bands endoscope was not       advanced distal to lowest placed bands. A large protuberant nipple sign       was noted and it was banded. Impression:               - Bleeding large (> 5 mm) esophageal varices.                            Incompletely eradicated. Banded.                           - No specimens collected. Moderate Sedation:      See the other procedure note for documentation of moderate sedation with       intraservice time.  Antimicrobials:  Anti-infectives (From admission, onward)   Start     Dose/Rate Route Frequency Ordered Stop    03/24/19 1000  Ampicillin-Sulbactam (UNASYN) 3 g in sodium chloride 0.9 % 100 mL IVPB     3 g 200 mL/hr over 30 Minutes Intravenous Every 6 hours 03/24/19 0830       Subjective: Seen and examined at bedside and she feels okay.  Still had a dark stool.  Denies any chest pain, lightheadedness or dizziness.  States that she did have a hacking cough that she wanted something for.  No nausea or vomiting.  Tolerating her full liquid diet.  Had to urgently go to use the restroom because of her lactulose.  No other concerns or plans at this time.  Objective: Vitals:   03/26/19 2100 03/26/19 2144 03/27/19 0438 03/27/19 1410  BP: (!) 161/106 (!) 162/99 (!) 153/99 (!) 158/102  Pulse: 96 94 88 99  Resp: (!) 21 14 16 16   Temp:  98.7 F (37.1 C) 98.5 F (36.9 C) 99.3 F (37.4 C)  TempSrc:  Oral Oral Oral  SpO2: 98% 94% 92% 97%  Weight:   112.1 kg   Height:        Intake/Output Summary (Last 24 hours) at 03/27/2019 1620 Last data filed at 03/27/2019 1449 Gross per 24 hour  Intake 823.05 ml  Output 2101 ml  Net -1277.95 ml   Filed Weights   03/25/19 0404 03/26/19 0352 03/27/19 0438  Weight: 114.3 kg 117.7 kg 112.1 kg   Examination: Physical Exam:  Constitutional: Well-nourished, well-developed morbidly obese Caucasian female currently no acute distress appears calm but slightly uncomfortable and had to rush to use the bathroom urgently. Eyes: Lids and conjunctive are normal.  Sclera anicteric ENMT: External ears and nose appear normal.  Grossly normal hearing Neck: Appears supple no JVD Respiratory: Slightly diminished auscultation bilaterally no appreciable wheezing, rales, rhonchi.  Patient is not tachypneic wheezing and accessory muscles to breathe and is not wearing any supplemental oxygen via nasal cannula Cardiovascular: Regular rate and rhythm.  No appreciable murmurs, rubs, gallops.  Mild lower extremity edema Abdomen: Soft, not as tender.  Distended secondary body habitus.  Bowel  sounds present GU: Deferred Musculoskeletal: No contractures or cyanosis.  No joint deformities in the upper and lower extremity Skin: Skin is warm and dry no appreciable rashes or lesions on to skin evaluation.  Has multiple skin tattoos Neurologic: Cranial nerves II through XII grossly intact no appreciable focal deficits.  Romberg sign cerebellar reflexes were not assessed Psychiatric:  Intact judgment and insight.  Patient is awake, alert, oriented x3.  Slightly anxious  Data Reviewed: I have personally reviewed following labs and imaging studies  CBC: Recent Labs  Lab 03/24/19 1351 03/24/19 2100 03/25/19 0347 03/26/19 0400 03/26/19 1052 03/27/19 0522  WBC 9.3 12.5* 11.7* 7.5  --  5.8  NEUTROABS 7.4  --   --   --   --  3.6  HGB 7.0* 7.3* 7.1* 6.4* 7.7* 8.4*  HCT 22.5* 24.0* 23.6* 21.3* 25.7* 27.1*  MCV 93.8 94.9 95.5 95.5  --  93.8  PLT 89* 116* 106* 84*  --  91*   Basic Metabolic Panel: Recent Labs  Lab 03/21/19 1257  03/24/19 0414 03/24/19 0913 03/25/19 0032 03/25/19 0347 03/26/19 0400 03/27/19 0522  NA  --    < > 144  --  146* 146* 142 140  K  --    < > 3.7  --  3.2* 3.1* 3.0* 3.3*  CL  --    < > 117*  --  115* 114* 106 105  CO2  --    < > 24  --  26 26 31 29   GLUCOSE  --    < > 228*  --  217* 223* 218* 209*  BUN  --    < > 18  --  16 15 10 7   CREATININE  --    < > 0.53  --  0.54 0.52 0.46 0.47  CALCIUM 8.0*   < > 7.5*  --  7.6* 7.6* 7.7* 8.2*  MG 1.7  --   --  2.0  --  2.1 1.9 2.2  PHOS 2.8  --   --   --   --  1.7* 1.9* 3.1   < > = values in this interval not displayed.   GFR: Estimated Creatinine Clearance: 110.3 mL/min (by C-G formula based on SCr of 0.47 mg/dL). Liver Function Tests: Recent Labs  Lab 03/21/19 0757 03/24/19 0414 03/25/19 0347 03/26/19 0400 03/27/19 0522  AST 49* 78* 49* 30 27  ALT 50* 93* 86* 60* 49*  ALKPHOS 133* 68 70 70 84  BILITOT 0.7 0.9 0.8 0.7 0.7  PROT 7.3 5.8* 6.3* 6.2* 6.7  ALBUMIN 3.3* 2.6* 2.7* 2.7* 2.9*   Recent  Labs  Lab 03/21/19 0756  LIPASE 35   No results for input(s): AMMONIA in the last 168 hours. Coagulation Profile: Recent Labs  Lab 03/21/19 1257 03/22/19 0316 03/26/19 0400  INR 1.3* 1.3* 1.3*   Cardiac Enzymes: Recent Labs  Lab 03/22/19 1439 03/24/19 1011  TROPONINI <0.03 <0.03   BNP (last 3 results) No results for input(s): PROBNP in the last 8760 hours. HbA1C: No results for input(s): HGBA1C in the last 72 hours. CBG: Recent Labs  Lab 03/26/19 2146 03/27/19 0018 03/27/19 0435 03/27/19 0830 03/27/19 1216  GLUCAP 215* 240* 193* 196* 303*   Lipid Profile: No results for input(s): CHOL, HDL, LDLCALC, TRIG, CHOLHDL, LDLDIRECT in the last 72 hours. Thyroid Function Tests: No results for input(s): TSH, T4TOTAL, FREET4, T3FREE, THYROIDAB in the last 72 hours. Anemia Panel: No results for input(s): VITAMINB12, FOLATE, FERRITIN, TIBC, IRON, RETICCTPCT in the last 72 hours. Sepsis Labs: Recent Labs  Lab 03/25/19 0347  LATICACIDVEN 1.0    Recent Results (from the past 240 hour(s))  SARS Coronavirus 2 (CEPHEID - Performed in [facility] hospital lab), Hosp Order     Status: None   Collection Time: 03/21/19 10:55 AM  Result Value Ref Range Status  SARS Coronavirus 2 NEGATIVE NEGATIVE Final    Comment: (NOTE) If result is NEGATIVE SARS-CoV-2 target nucleic acids are NOT DETECTED. The SARS-CoV-2 RNA is generally detectable in upper and lower  respiratory specimens during the acute phase of infection. The lowest  concentration of SARS-CoV-2 viral copies this assay can detect is 250  copies / mL. A negative result does not preclude SARS-CoV-2 infection  and should not be used as the sole basis for treatment or other  patient management decisions.  A negative result may occur with  improper specimen collection / handling, submission of specimen other  than nasopharyngeal swab, presence of viral mutation(s) within the  areas targeted by this assay, and inadequate  number of viral copies  (<250 copies / mL). A negative result must be combined with clinical  observations, patient history, and epidemiological information. If result is POSITIVE SARS-CoV-2 target nucleic acids are DETECTED. The SARS-CoV-2 RNA is generally detectable in upper and lower  respiratory specimens dur ing the acute phase of infection.  Positive  results are indicative of active infection with SARS-CoV-2.  Clinical  correlation with patient history and other diagnostic information is  necessary to determine patient infection status.  Positive results do  not rule out bacterial infection or co-infection with other viruses. If result is PRESUMPTIVE POSTIVE SARS-CoV-2 nucleic acids MAY BE PRESENT.   A presumptive positive result was obtained on the submitted specimen  and confirmed on repeat testing.  While 2019 novel coronavirus  (SARS-CoV-2) nucleic acids may be present in the submitted sample  additional confirmatory testing may be necessary for epidemiological  and / or clinical management purposes  to differentiate between  SARS-CoV-2 and other Sarbecovirus currently known to infect humans.  If clinically indicated additional testing with an alternate test  methodology [phone]) is advised. The SARS-CoV-2 RNA is generally  detectable in upper and lower respiratory sp ecimens during the acute  phase of infection. The expected result is Negative. Fact Sheet for Patients:  StrictlyIdeas.no Fact Sheet for Healthcare Providers: BankingDealers.co.za This test is not yet approved or cleared by the [place] FDA and has been authorized for detection and/or diagnosis of SARS-CoV-2 by FDA under an Emergency Use Authorization (EUA).  This EUA will remain in effect (meaning this test can be used) for the duration of the COVID-19 declaration under Section 564(b)(1) of the Act, 21 U.S.C. section 360bbb-3(b)(1), unless the  authorization is terminated or revoked sooner. Performed at [facility], [place] [address] [place], [place] [address]   MRSA PCR Screening     Status: None   Collection Time: 03/22/19  4:56 PM  Result Value Ref Range Status   MRSA by PCR NEGATIVE NEGATIVE Final    Comment:        The GeneXpert MRSA Assay (FDA approved for NASAL specimens only), is one component of a comprehensive MRSA colonization surveillance program. It is not intended to diagnose MRSA infection nor to guide or monitor treatment for MRSA infections. Performed at [facility], [place] [address], [place], [place] [address]   Culture, Urine     Status: Abnormal   Collection Time: 03/24/19  9:12 AM  Result Value Ref Range Status   Specimen Description   Final    URINE, CATHETERIZED Performed at [place] [address] [place], [place] [address]    Special Requests   Final    Normal Performed at [facility], [place] [address], [place], [place] [address]  Culture 50,000 COLONIES/mL ESCHERICHIA COLI (A)  Final   Report Status 03/26/2019 FINAL  Final   Organism ID, Bacteria ESCHERICHIA COLI (A)  Final      Susceptibility   Escherichia coli - MIC*    AMPICILLIN <=2 SENSITIVE Sensitive     CEFAZOLIN <=4 SENSITIVE Sensitive     CEFTRIAXONE <=1 SENSITIVE Sensitive     CIPROFLOXACIN <=0.25 SENSITIVE Sensitive     GENTAMICIN <=1 SENSITIVE Sensitive     IMIPENEM <=0.25 SENSITIVE Sensitive     NITROFURANTOIN <=16 SENSITIVE Sensitive     TRIMETH/SULFA <=20 SENSITIVE Sensitive     AMPICILLIN/SULBACTAM <=2 SENSITIVE Sensitive     PIP/TAZO <=4 SENSITIVE Sensitive     Extended ESBL NEGATIVE Sensitive     * 50,000 COLONIES/mL ESCHERICHIA COLI  Culture, respiratory (non-expectorated)     Status: None   Collection Time: 03/24/19 10:00 AM  Result Value Ref Range Status   Specimen Description   Final    TRACHEAL ASPIRATE Performed at [place] [address] [place], [place] [address]    Special Requests   Final    Normal Performed at [facility], [place] [address] [place], [place] [address]    Gram Stain   Final    RARE WBC PRESENT,BOTH PMN AND MONONUCLEAR MODERATE GRAM POSITIVE COCCI FEW GRAM NEGATIVE RODS Performed at [facility], [place] [address], [place], [place] [address]    Culture MODERATE STAPHYLOCOCCUS AUREUS  Final   Report Status 03/27/2019 FINAL  Final   Organism ID, Bacteria STAPHYLOCOCCUS AUREUS  Final      Susceptibility   Staphylococcus aureus - MIC*    CIPROFLOXACIN <=0.5 SENSITIVE Sensitive     ERYTHROMYCIN <=0.25 SENSITIVE Sensitive     GENTAMICIN <=0.5 SENSITIVE Sensitive     OXACILLIN <=0.25 SENSITIVE Sensitive     TETRACYCLINE <=1 SENSITIVE Sensitive     VANCOMYCIN <=0.5 SENSITIVE Sensitive     TRIMETH/SULFA <=10 SENSITIVE Sensitive     CLINDAMYCIN <=0.25 SENSITIVE Sensitive     RIFAMPIN <=0.5 SENSITIVE Sensitive     Inducible Clindamycin NEGATIVE Sensitive     * MODERATE STAPHYLOCOCCUS AUREUS  Culture, blood (routine x 2)     Status: None (Preliminary result)   Collection Time: 03/24/19 10:39 AM  Result Value Ref Range Status   Specimen Description   Final    BLOOD RIGHT ARM Performed at [facility] [address], [place], [place] [address]    Special Requests   Final    BOTTLES DRAWN AEROBIC ONLY Blood Culture adequate volume   Culture   Final    NO GROWTH 3 DAYS Performed at [facility], [place] [address], [place], [place] [address]    Report Status PENDING  Incomplete  Culture, blood (routine x 2)     Status: None (Preliminary result)   Collection Time: 03/24/19 10:48 AM  Result Value Ref Range Status   Specimen Description   Final    BLOOD RIGHT WRIST Performed at [facility] [address] [place], [place] [address]    Special Requests   Final    BOTTLES DRAWN AEROBIC ONLY Blood  Culture adequate volume   Culture   Final    NO GROWTH 3 DAYS Performed at [facility], [place] [address] [place], [place] [address]    Report Status PENDING  Incomplete   Radiology Studies: Dg Chest Port 1 View  Result Date: 03/27/2019 CLINICAL DATA:  43 year old female with history of shortness of breath. EXAM: PORTABLE CHEST 1 VIEW COMPARISON:  Chest x-ray 03/25/2019. FINDINGS: Lung volumes are normal. No consolidative airspace disease. No pleural effusions. No evidence of pulmonary edema. Mild cardiomegaly. Upper mediastinal contours are within normal limits. IMPRESSION: 1. No radiographic evidence of acute cardiopulmonary disease. 2. Mild cardiomegaly. Electronically Signed   By: Vinnie Langton M.D.   On: 03/27/2019 06:03   Scheduled Meds: . Chlorhexidine Gluconate Cloth  6 each Topical Daily  . ferrous sulfate  325 mg Oral Q breakfast  . guaiFENesin  1,200 mg Oral BID  . insulin aspart  0-9 Units Subcutaneous TID AC & HS  . insulin detemir  6 Units Subcutaneous BID  . lactulose  10 g Oral TID  . potassium chloride  40 mEq Oral BID  . sodium chloride flush  10-40 mL Intracatheter Q12H  . sodium chloride flush  3 mL Intravenous Q12H   Continuous Infusions: . ampicillin-sulbactam (UNASYN) IV 3 g (03/27/19 1044)  . octreotide  (SANDOSTATIN)    IV infusion 25 mcg/hr (03/27/19 0625)  . pantoprozole (PROTONIX) infusion 8 mg/hr (03/27/19 1131)    LOS: 6 days    Kerney Elbe, DO [facility] PAGER is on [place]  If 7PM-7AM, please contact night-coverage [email or web address] Password [email or web address] 03/27/2019, 4:20 PM

## 2019-03-27 NOTE — Progress Notes (Signed)
Physical Therapy Treatment Patient Details Name: Angela Castillo MRN: [id number] DOB: Apr 10, 1976 Today's Date: 03/27/2019    History of Present Illness 43 year old female admitted for GIB.  s/p intubation.  PMH:  shoulder and back pain, headaches    PT Comments    Pt continues to progress well with mobility, requiring decreased assist for all tasks and demonstrating increased endurance and improving ambulatory balance.   Follow Up Recommendations  No PT follow up     Equipment Recommendations  None recommended by PT    Recommendations for Other Services OT consult     Precautions / Restrictions Precautions Precautions: Fall Restrictions Weight Bearing Restrictions: No    Mobility  Bed Mobility Overal bed mobility: Needs Assistance Bed Mobility: Supine to Sit     Supine to sit: Supervision;HOB elevated     General bed mobility comments: increased time with use of bedrail and HOB elevated but no physical assist required  Transfers Overall transfer level: Needs assistance Equipment used: Rolling walker (2 wheeled) Transfers: Sit to/from Stand Sit to Stand: Min guard         General transfer comment: steady assist only  Ambulation/Gait Ambulation/Gait assistance: Min assist;Min guard Gait Distance (Feet): 450 Feet Assistive device: Rolling walker (2 wheeled);1 person hand held assist Gait Pattern/deviations: Step-through pattern;Shuffle;Wide base of support     General Gait Details: Pt ambulated 400' with RW and additional 3650' with min HHA only   Stairs             Wheelchair Mobility    Modified Rankin (Stroke Patients Only)       Balance Overall balance assessment: Mild deficits observed, not formally tested                                          Cognition Arousal/Alertness: Awake/alert Behavior During Therapy: WFL for tasks assessed/performed Overall Cognitive Status: Within Functional Limits for tasks  assessed                                        Exercises      General Comments        Pertinent Vitals/Pain Pain Assessment: No/denies pain    Home Living                      Prior Function            PT Goals (current goals can now be found in the care plan section) Acute Rehab PT Goals Patient Stated Goal: return to PLOF PT Goal Formulation: With patient Time For Goal Achievement: 04/08/19 Potential to Achieve Goals: Good Progress towards PT goals: Progressing toward goals    Frequency    Min 3X/week      PT Plan Current plan remains appropriate    Co-evaluation              AM-PAC PT "6 Clicks" Mobility   Outcome Measure  Help needed turning from your back to your side while in a flat bed without using bedrails?: A Little Help needed moving from lying on your back to sitting on the side of a flat bed without using bedrails?: A Little Help needed moving to and from a bed to a chair (including a wheelchair)?: A Little Help needed standing  up from a chair using your arms (e.g., wheelchair or bedside chair)?: A Little Help needed to walk in hospital room?: A Little Help needed climbing 3-5 steps with a railing? : A Lot 6 Click Score: 17    End of Session   Activity Tolerance: Patient tolerated treatment well Patient left: in chair Nurse Communication: Mobility status PT Visit Diagnosis: Muscle weakness (generalized) (M62.81);Unsteadiness on feet (R26.81);Difficulty in walking, not elsewhere classified (R26.2);Pain;Other symptoms and signs involving the nervous system (R29.898)     Time: 1020-1039 PT Time Calculation (min) (ACUTE ONLY): 19 min  Charges:  $Gait Training: 8-22 mins                     Mauro Kaufmann PT Acute Rehabilitation Services Pager [phone] Office [phone]    Angela Castillo 03/27/2019, 12:07 PM

## 2019-03-27 NOTE — Progress Notes (Signed)
Subjective: Some dark stools. No abdominal pain. No further hematemesis.  Objective: Vital signs in last 24 hours: Temp:  [97.9 F (36.6 C)-98.7 F (37.1 C)] 98.5 F (36.9 C) (05/23 0438) Pulse Rate:  [87-96] 88 (05/23 0438) Resp:  [13-21] 16 (05/23 0438) BP: (149-170)/(86-106) 153/99 (05/23 0438) SpO2:  [92 %-98 %] 92 % (05/23 0438) Weight:  [112.1 kg] 112.1 kg (05/23 0438) Weight change: -5.6 kg Last BM Date: 03/26/19  PE: GEN:  Overweight, debilitated and deconditioned-appearing ABD:  Soft, protuberant, non-tender  Lab Results: CBC    Component Value Date/Time   WBC 5.8 03/27/2019 0522   RBC 2.89 (L) 03/27/2019 0522   HGB 8.4 (L) 03/27/2019 0522   HGB 13.0 05/01/2017 0909   HCT 27.1 (L) 03/27/2019 0522   HCT 40.6 05/01/2017 0909   PLT 91 (L) 03/27/2019 0522   PLT 147 (L) 05/01/2017 0909   MCV 93.8 03/27/2019 0522   MCV 89 05/01/2017 0909   MCH 29.1 03/27/2019 0522   MCHC 31.0 03/27/2019 0522   RDW 16.2 (H) 03/27/2019 0522   RDW 14.4 05/01/2017 0909   LYMPHSABS 1.5 03/27/2019 0522   MONOABS 0.4 03/27/2019 0522   EOSABS 0.1 03/27/2019 0522   BASOSABS 0.0 03/27/2019 0522   CMP     Component Value Date/Time   NA 140 03/27/2019 0522   NA 139 05/01/2017 0909   K 3.3 (L) 03/27/2019 0522   CL 105 03/27/2019 0522   CO2 29 03/27/2019 0522   GLUCOSE 209 (H) 03/27/2019 0522   BUN 7 03/27/2019 0522   BUN 15 05/01/2017 0909   CREATININE 0.47 03/27/2019 0522   CREATININE 0.68 06/21/2016 1713   CALCIUM 8.2 (L) 03/27/2019 0522   PROT 6.7 03/27/2019 0522   PROT 7.9 05/01/2017 0909   ALBUMIN 2.9 (L) 03/27/2019 0522   ALBUMIN 4.4 05/01/2017 0909   AST 27 03/27/2019 0522   ALT 49 (H) 03/27/2019 0522   ALKPHOS 84 03/27/2019 0522   BILITOT 0.7 03/27/2019 0522   BILITOT 0.4 05/01/2017 0909   GFRNONAA >60 03/27/2019 0522   GFRNONAA >89 06/21/2016 1713   GFRAA >60 03/27/2019 0522   GFRAA >89 06/21/2016 1713    Assessment:  1.  Decompensated cirrhosis (suspected  NASH). 2.  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  No further active bleeding. 3.  Acute blood loss anemia, likely from #2 above.  Stable. 4.  Obesity.  Plan:  1.  Continue PPI and octreotide drips for another 24 hours; tomorrow can likely change to oral PPI and stop octreotide entirely. 2.  Soft mechanical diet (would not advance). 3.  7-day course of antibiotics needed (GI bleed in patient with cirrhosis). 4.  OOBTC, mobilize as possible. 5.  [facility] will follow.  Case discussed with Dr. Marland Mcalpine.   Freddy Jaksch 03/27/2019, 9:40 AM   Cell [phone] If no answer or after 5 PM call [phone]

## 2019-03-28 LAB — COMPREHENSIVE METABOLIC PANEL
ALT: 50 U/L — ABNORMAL HIGH (ref 0–44)
AST: 48 U/L — ABNORMAL HIGH (ref 15–41)
Albumin: 3 g/dL — ABNORMAL LOW (ref 3.5–5.0)
Alkaline Phosphatase: 100 U/L (ref 38–126)
Anion gap: 7 (ref 5–15)
BUN: 7 mg/dL (ref 6–20)
CO2: 29 mmol/L (ref 22–32)
Calcium: 8.4 mg/dL — ABNORMAL LOW (ref 8.9–10.3)
Chloride: 102 mmol/L (ref 98–111)
Creatinine, Ser: 0.6 mg/dL (ref 0.44–1.00)
GFR calc Af Amer: >60 mL/min (ref >60)
GFR calc non Af Amer: >60 mL/min (ref >60)
Glucose, Bld: 257 mg/dL — ABNORMAL HIGH (ref 70–99)
Potassium: 4 mmol/L (ref 3.5–5.1)
Sodium: 138 mmol/L (ref 135–145)
Total Bilirubin: 0.7 mg/dL (ref 0.3–1.2)
Total Protein: 7 g/dL (ref 6.5–8.1)

## 2019-03-28 LAB — PHOSPHORUS: Phosphorus: 3.7 mg/dL (ref 2.5–4.6)

## 2019-03-28 LAB — GLUCOSE, CAPILLARY
Glucose-Capillary: 160 mg/dL — ABNORMAL HIGH (ref 70–99)
Glucose-Capillary: 173 mg/dL — ABNORMAL HIGH (ref 70–99)
Glucose-Capillary: 175 mg/dL — ABNORMAL HIGH (ref 70–99)
Glucose-Capillary: 176 mg/dL — ABNORMAL HIGH (ref 70–99)
Glucose-Capillary: 178 mg/dL — ABNORMAL HIGH (ref 70–99)
Glucose-Capillary: 261 mg/dL — ABNORMAL HIGH (ref 70–99)

## 2019-03-28 LAB — TYPE AND SCREEN
ABO/RH(D): O POS
Antibody Screen: NEGATIVE
Unit division: 0

## 2019-03-28 LAB — BPAM RBC
Blood Product Expiration Date: 202006152359
ISSUE DATE / TIME: 202005220623
Unit Type and Rh: 5100

## 2019-03-28 LAB — CBC WITH DIFFERENTIAL/PLATELET
Abs Immature Granulocytes: 0.11 10*3/uL — ABNORMAL HIGH (ref 0.00–0.07)
Basophils Absolute: 0 10*3/uL (ref 0.0–0.1)
Basophils Relative: 0 %
Eosinophils Absolute: 0.2 10*3/uL (ref 0.0–0.5)
Eosinophils Relative: 3 %
HCT: 29 % — ABNORMAL LOW (ref 36.0–46.0)
Hemoglobin: 8.9 g/dL — ABNORMAL LOW (ref 12.0–15.0)
Immature Granulocytes: 2 %
Lymphocytes Relative: 22 %
Lymphs Abs: 1.4 10*3/uL (ref 0.7–4.0)
MCH: 29.1 pg (ref 26.0–34.0)
MCHC: 30.7 g/dL (ref 30.0–36.0)
MCV: 94.8 fL (ref 80.0–100.0)
Monocytes Absolute: 0.3 10*3/uL (ref 0.1–1.0)
Monocytes Relative: 5 %
Neutro Abs: 4.5 10*3/uL (ref 1.7–7.7)
Neutrophils Relative %: 68 %
Platelets: 104 10*3/uL — ABNORMAL LOW (ref 150–400)
RBC: 3.06 MIL/uL — ABNORMAL LOW (ref 3.87–5.11)
RDW: 16.4 % — ABNORMAL HIGH (ref 11.5–15.5)
WBC: 6.5 10*3/uL (ref 4.0–10.5)
nRBC: 0.5 % — ABNORMAL HIGH (ref 0.0–0.2)

## 2019-03-28 LAB — RESPIRATORY PANEL BY PCR

## 2019-03-28 LAB — MAGNESIUM: Magnesium: 1.9 mg/dL (ref 1.7–2.4)

## 2019-03-28 MED ORDER — HYDROCORTISONE (PERIANAL) 2.5 % EX CREA
TOPICAL_CREAM | Freq: Three times a day (TID) | CUTANEOUS | Status: DC | PRN
  Administered 2019-03-28: 17:00:00 via RECTAL
  Filled 2019-03-28: qty 28.35

## 2019-03-28 MED ORDER — HYDROCOD POLST-CPM POLST ER 10-8 MG/5ML PO SUER
5.0000 mL | Freq: Two times a day (BID) | ORAL | Status: DC | PRN
  Administered 2019-03-28 – 2019-03-29 (×2): 5 mL via ORAL
  Filled 2019-03-28 (×2): qty 5

## 2019-03-28 MED ORDER — PANTOPRAZOLE SODIUM 40 MG PO TBEC
40.0000 mg | DELAYED_RELEASE_TABLET | Freq: Every day | ORAL | Status: DC
  Administered 2019-03-28 – 2019-03-29 (×2): 40 mg via ORAL
  Filled 2019-03-28 (×2): qty 1

## 2019-03-28 NOTE — Progress Notes (Signed)
Physical Therapy Treatment Patient Details Name: Angela Castillo MRN: [id number] DOB: [birth year] Today's Date: 03/28/2019    History of Present Illness 43 year old female admitted for GIB.  s/p intubation.  PMH:  shoulder and back pain, headaches    PT Comments    Pt progressing well and mobilizing today sans AD.   Follow Up Recommendations  No PT follow up     Equipment Recommendations  None recommended by PT    Recommendations for Other Services OT consult     Precautions / Restrictions Precautions Precautions: Fall Restrictions Weight Bearing Restrictions: No    Mobility  Bed Mobility Overal bed mobility: Needs Assistance Bed Mobility: Supine to Sit;Sit to Supine     Supine to sit: Supervision Sit to supine: Supervision   General bed mobility comments: no physical assist   Transfers Overall transfer level: Needs assistance Equipment used: None Transfers: Sit to/from Stand Sit to Stand: Min guard;Supervision         General transfer comment: steady assist only  Ambulation/Gait Ambulation/Gait assistance: Min guard;Supervision Gait Distance (Feet): 450 Feet Assistive device: None Gait Pattern/deviations: Step-through pattern;Shuffle;Wide base of support Gait velocity: decr   General Gait Details: mild occasional instability but no LOB   Stairs             Wheelchair Mobility    Modified Rankin (Stroke Patients Only)       Balance Overall balance assessment: Mild deficits observed, not formally tested                                          Cognition Arousal/Alertness: Awake/alert Behavior During Therapy: WFL for tasks assessed/performed Overall Cognitive Status: Within Functional Limits for tasks assessed                                        Exercises      General Comments        Pertinent Vitals/Pain Pain Assessment: 0-10 Pain Score: 3  Pain Location: R hip 2* arthritis Pain  Descriptors / Indicators: Aching Pain Intervention(s): Limited activity within patient's tolerance;Monitored during session    Home Living                      Prior Function            PT Goals (current goals can now be found in the care plan section) Acute Rehab PT Goals Patient Stated Goal: return to PLOF PT Goal Formulation: With patient Time For Goal Achievement: 04/08/19 Potential to Achieve Goals: Good Progress towards PT goals: Progressing toward goals    Frequency    Min 3X/week      PT Plan Current plan remains appropriate    Co-evaluation              AM-PAC PT "6 Clicks" Mobility   Outcome Measure  Help needed turning from your back to your side while in a flat bed without using bedrails?: None Help needed moving from lying on your back to sitting on the side of a flat bed without using bedrails?: None Help needed moving to and from a bed to a chair (including a wheelchair)?: None Help needed standing up from a chair using your arms (e.g., wheelchair or bedside chair)?: A Little Help needed  to walk in hospital room?: A Little Help needed climbing 3-5 steps with a railing? : A Little 6 Click Score: 21    End of Session Equipment Utilized During Treatment: Gait belt Activity Tolerance: Patient tolerated treatment well Patient left: in bed;with call bell/phone within reach Nurse Communication: Mobility status PT Visit Diagnosis: Muscle weakness (generalized) (M62.81);Unsteadiness on feet (R26.81);Difficulty in walking, not elsewhere classified (R26.2);Pain;Other symptoms and signs involving the nervous system (R29.898)     Time: [id number] PT Time Calculation (min) (ACUTE ONLY): 12 min  Charges:  $Gait Training: 8-22 mins                     Mauro KaufmannHunter Eura Mccauslin PT Acute Rehabilitation Services Pager [phone] Office [phone]    Mckaela Howley 03/28/2019, 1:13 PM

## 2019-03-28 NOTE — Progress Notes (Signed)
PROGRESS NOTE    ATINA FEELEY  BWI:[id number] DOB: Sep 09, 1976 DOA: 03/21/2019 PCP: Patient, No Pcp Per   Brief Narrative:  Patient is a 43 year old morbidly obese Caucasian female with a past medical history significant for but not limited to suspected cirrhosis secondary to [facility] who presented with a recent upper GI bleeding and underwent gastric variceal banding on 03/22/2018.  She was intubated for airway protection and extubated on 03/24/2019.  Subsequently it felt that she aspirated during her intubation and she spiked a temperature of 102 and sputum culture grew out gram-positive cocci and was Pan-sensitive Staph Aureus so she was placed on IV Unasyn.  She became hypotensive and was placed on Neo-Synephrine but this is been weaned off and was given multiple fluid boluses.  She is 5 L positive and diuresis was initiated and now she is off pressors.  She received IV Lasix 40 g twice daily yesterday and this is been stopped.  Gastroenterology is following and patient remains on octreotide drip along with a Protonix drip and hemoglobin/hematocrit dropped yesterday so transfused 1 unit PRBC.  She was transferred to the medical floor and is improving.  Still was having some dark stools so GI is recommended continuing PPI and octreotide drip until today and stopped Octreotide gtt and transitioned to po PPI.  Diet has been advanced to soft diet.  GI also recommending 7-day course of antibiotics and mobilization.    Assessment & Plan:   Principal Problem:   GI bleed Active Problems:   Anemia, iron deficiency   Essential hypertension   Obesity (BMI 30-39.9)   Esophageal reflux   Acute respiratory insufficiency   Respiratory failure (HCC)   Shock circulatory (HCC)  Acute Upper GI bleeding secondary to variceal bleed with gastric banding x14 in the setting of NASH and Cirrhosis; Acute Blood Loss Anemia -Large Esophageal Varices Noted on EGD -Has had banding 2 separate occasions during the  hospitalization -Hemoglobin/hematocrit went from 7.0/22.5 -> 7.3/24.0 -> 7.1/23.6 -> 6.4/21.3 -> 7.7/25.7 (after 1 unit of pRBC) -> 8.4/27.1 -> 8.9/29.0 -Gastroenterology is following  -Transfuse 1 unit PRBCs on the night of 5/21-5/22 and hemoglobin/hematocrit is improved. -Patient is status post 4 units of PRBCs total -Continued with Protonix and Octreotide drips but now stopped per GI Recc's and transitioned to po PPI Pantoprazole 40 mg BID  -GI recommending supportive care and diet is being advanced from full liquid diet to a soft diet -Continue to monitor for signs and symptoms of bleeding; patient still having dark stools -GI is recommending a total of 7 days of antibiotics given her liver cirrhosis today is Day 5 and will changed IV Unasyn to IV Ceftriaxone for coverage yesterday  -Repeat CBC in the a.m. -Patient is hemodynamically stable  -PT/OT to evaluate and treat and recommending no Follow up   Acute Respiratory Failure status post extubation for banding procedure, improved -She is improved and now off of oxygen as after she is extubated -She was also diuresed with IV Lasix twice daily the day before yesterday -Will need to continue monitor respiratory status carefully -Repeat CXR every other day and repeat in AM  -Continue with albuterol 2.5 mg nebs every 2 PRN for wheezing -Repeat chest x-ray this AM showed "Lung volumes are normal. No consolidative airspace disease. No pleural effusions. No evidence of pulmonary edema. Mild cardiomegaly. Upper mediastinal contours are within normal limits."  Aspiration Pneumonia -Suspected to have aspirated during intubation as she had a temperature of 102 but is now improved  -  C/w Pulmonary Toilet -Sputum cultures is growing gram-positive cocci  And showed Moderate Staphylococcus Aureus so she was placed on IV Unasyn; Will change to IV Ceftriaxone given Pansensitive Staph Aureus and will also need to cover Anaerobes; Today is Day 4/7 of Abx  Coverage  -Continue Albuterol 2.5 mg nebs every 2 hours PRN for wheezing -Swallow Evaluation done and recommending Thin Liquids -Chest x-ray on 03/25/19 showed "Cardiac shadow is mildly enlarged but stable. Left jugular central line is noted in satisfactory position. The endotracheal tube is been removed. No focal infiltrate or sizable effusion is seen. No bony abnormality is noted." -Blood Cx x2 showed NGTD at 4 Days  -Repeat CXR in AM   HTN and recent Hypotension; Hypotension has resolved  -No longer on Neo-synephrine -Was Holding home Cozaar but will resume now; Takes 100 mg po Daily  -BP this AM was 135/98  Hyperglycemia in the setting of Uncontrolled Diabetes Mellitus Type 2 -Patient's CBG's ranging from 175-361 -Added Levemir 6 units BID -C/w Sensitive Novolog SSI q4h -Checked HbA1c and was 9.5 -Diabetes Education Coordinator Consulted and Following and appreciate Recommendations -Will need Further Diabetic Teaching   Hematuria in the Setting of Menstruation vs. ? E Coli UTI -We will obtain urinalysis -Patient did have a urine culture done on 03/24/2019 which did show E. coli with 50,000 colonies that was pansensitive but her Urine was Catheterized -We will repeat a urinalysis currently and patient is already on IV Unasyn so should cover if she did have urinary tract infection -We will continue to monitor for signs and symptoms of bleeding -If persists will consult urology but patient believes that she may be on her period as her last cycle was 02/16/2019 -Repeat Urinalysis not done yesterday so will repeat today  -Continue to Monitor Closely  Liver Cirrhosis (Decompensated) likely from NASH in the setting of Morbid Obesity Abnormal LFTs -GI Following -C/w Lactulose 10 grams TID -Discussed with Gastroenterology and recommends Propanolol or Nadolol eventually and likely can be started just prior to D/C or as an outpatient -Patient's LFTs  were trending down and AST went from 78  -> 49 -> 30 -> 27 but have now started trending back up again as AST is now 48 -ALT went from 93 -> 86 -> 60 -> 49 -> 50 -GI Recommending Abx for 7 Days total and changed IV Unasyn to IV Ceftriaxone (Day 5 of Abx) -Continue to Monitor and Trend LFTs -Repeat CMP in AM   Hypokalemia -Patient potassium is morning was 4.0 -Cardiac Monitoring discontinued  -Continue monitor replete as necessary -CMP in AM.  Hypophosphatemia -Patient's phosphorus levels morning was 3.7 -Continue monitor replete as necessary -Repeat phosphorus level in a.m.  Morbid Obesity -Estimated body mass index is 43.78 kg/m as calculated from the following:   Height as of this encounter: 5' 3"  (1.6 m).   Weight as of this encounter: 112.1 kg.  -Weight Loss and Dietary Counseling given  -Nutritionist on Board   Thrombocytopenia -Patient's Platelet Count low in the setting of Cirrhosis and GI Bleeding -Platelet Count went from 116 -> 106 -> 84 -> 91 -> 104 -Continue to Monitor and Trend -Repeat CBC in AM  DVT prophylaxis: SCDs given GI Bleeding  Code Status: FULL CODE Family Communication: No family present at bedside  Disposition Plan: Home when medically stable and Cleared by GI  Consultants:   PCCM Transfer  Gastroenterology   Procedures:  ETT 5/18 -> 5/20  EGD 03/23/19 Findings:      Three  columns of oozing large (> 5 mm) varices were found in the lower       third of the esophagus,. Stigmata of recent bleeding were evident and       red wale signs were present. Six bands were successfully placed with       incomplete eradication of varices. Bleeding had stopped at the end of       the procedure.      Due to fear of dislodging previouslyplaced bands endoscope was not       advanced distal to lowest placed bands. A large protuberant nipple sign       was noted and it was banded. Impression:               - Bleeding large (> 5 mm) esophageal varices.                            Incompletely  eradicated. Banded.                           - No specimens collected. Moderate Sedation:      See the other procedure note for documentation of moderate sedation with       intraservice time.  Antimicrobials:  Anti-infectives (From admission, onward)   Start     Dose/Rate Route Frequency Ordered Stop   03/27/19 1800  cefTRIAXone (ROCEPHIN) 2 g in sodium chloride 0.9 % 100 mL IVPB     2 g 200 mL/hr over 30 Minutes Intravenous Every 24 hours 03/27/19 1631     03/24/19 1000  Ampicillin-Sulbactam (UNASYN) 3 g in sodium chloride 0.9 % 100 mL IVPB  Status:  Discontinued     3 g 200 mL/hr over 30 Minutes Intravenous Every 6 hours 03/24/19 0830 03/27/19 1631     Subjective: Seen and examined at bedside and states that she was feeling better.  Denies chest pain, lightheadedness or dizziness.  No nausea or vomiting.  States that she slept okay.  Happy to be off the Protonix and octreotide drip.  Still tolerating soft diet.  No other concerns or complaints at this time but wanted to shower.  Objective: Vitals:   03/27/19 0438 03/27/19 1410 03/27/19 2039 03/28/19 0425  BP: (!) 153/99 (!) 158/102 (!) 154/95 (!) 135/98  Pulse: 88 99 91 83  Resp: 16 16 19 20   Temp: 98.5 F (36.9 C) 99.3 F (37.4 C) 98.3 F (36.8 C) 98.6 F (37 C)  TempSrc: Oral Oral Oral Oral  SpO2: 92% 97% 97% 95%  Weight: 112.1 kg     Height:        Intake/Output Summary (Last 24 hours) at 03/28/2019 1353 Last data filed at 03/28/2019 1047 Gross per 24 hour  Intake 831.33 ml  Output 1700 ml  Net -868.67 ml   Filed Weights   03/25/19 0404 03/26/19 0352 03/27/19 0438  Weight: 114.3 kg 117.7 kg 112.1 kg   Examination: Physical Exam:  Constitutional: Well-nourished, well-developed morbidly obese Caucasian female currently no acute distress appears calm and comfortable today Eyes: Lids and conjunctive are normal.  Sclera is anicteric ENMT: External ears and nose appear normal.  Grossly normal hearing.  Mucous  membranes appear moist Neck: Appears supple with no JVD but does have neck bandage from her prior central venous catheter. Respiratory: Mildly diminished to auscultation bilaterally no appreciable wheezing, rales, rhonchi.  Patient not tachypneic or using any accessory  muscles of breathing Cardiovascular: Regular rate and rhythm.  No appreciable murmurs, rubs, gallops. Abdomen: Soft, nontender, distended secondary to body habitus.  Bowel sounds present GU: Deferred Musculoskeletal: No contractures or cyanosis.  No joint deformities in upper extremity Skin: Skin is warm and dry with no appreciable rashes or lesions on physical evaluation.  Has multiple skin tattoos noted Neurologic: Cranial nerves II through XII grossly intact no appreciable focal deficits.  Romberg sign cerebellar reflexes were not assessed Psychiatric: Intact judgment and insight.  Patient is awake, alert, oriented x3.  Not as anxious today  Data Reviewed: I have personally reviewed following labs and imaging studies  CBC: Recent Labs  Lab 03/24/19 1351 03/24/19 2100 03/25/19 0347 03/26/19 0400 03/26/19 1052 03/27/19 0522 03/28/19 1020  WBC 9.3 12.5* 11.7* 7.5  --  5.8 6.5  NEUTROABS 7.4  --   --   --   --  3.6 4.5  HGB 7.0* 7.3* 7.1* 6.4* 7.7* 8.4* 8.9*  HCT 22.5* 24.0* 23.6* 21.3* 25.7* 27.1* 29.0*  MCV 93.8 94.9 95.5 95.5  --  93.8 94.8  PLT 89* 116* 106* 84*  --  91* 250*   Basic Metabolic Panel: Recent Labs  Lab 03/24/19 0913 03/25/19 0032 03/25/19 0347 03/26/19 0400 03/27/19 0522 03/28/19 1020  NA  --  146* 146* 142 140 138  K  --  3.2* 3.1* 3.0* 3.3* 4.0  CL  --  115* 114* 106 105 102  CO2  --  26 26 31 29 29   GLUCOSE  --  217* 223* 218* 209* 257*  BUN  --  16 15 10 7 7   CREATININE  --  0.54 0.52 0.46 0.47 0.60  CALCIUM  --  7.6* 7.6* 7.7* 8.2* 8.4*  MG 2.0  --  2.1 1.9 2.2 1.9  PHOS  --   --  1.7* 1.9* 3.1 3.7   GFR: Estimated Creatinine Clearance: 110.3 mL/min (by C-G formula based on SCr  of 0.6 mg/dL). Liver Function Tests: Recent Labs  Lab 03/24/19 0414 03/25/19 0347 03/26/19 0400 03/27/19 0522 03/28/19 1020  AST 78* 49* 30 27 48*  ALT 93* 86* 60* 49* 50*  ALKPHOS 68 70 70 84 100  BILITOT 0.9 0.8 0.7 0.7 0.7  PROT 5.8* 6.3* 6.2* 6.7 7.0  ALBUMIN 2.6* 2.7* 2.7* 2.9* 3.0*   No results for input(s): LIPASE, AMYLASE in the last 168 hours. No results for input(s): AMMONIA in the last 168 hours. Coagulation Profile: Recent Labs  Lab 03/22/19 0316 03/26/19 0400  INR 1.3* 1.3*   Cardiac Enzymes: Recent Labs  Lab 03/22/19 1439 03/24/19 1011  TROPONINI <0.03 <0.03   BNP (last 3 results) No results for input(s): PROBNP in the last 8760 hours. HbA1C: No results for input(s): HGBA1C in the last 72 hours. CBG: Recent Labs  Lab 03/27/19 2037 03/28/19 0000 03/28/19 0422 03/28/19 0802 03/28/19 1203  GLUCAP 210* 176* 175* 178* 261*   Lipid Profile: No results for input(s): CHOL, HDL, LDLCALC, TRIG, CHOLHDL, LDLDIRECT in the last 72 hours. Thyroid Function Tests: No results for input(s): TSH, T4TOTAL, FREET4, T3FREE, THYROIDAB in the last 72 hours. Anemia Panel: No results for input(s): VITAMINB12, FOLATE, FERRITIN, TIBC, IRON, RETICCTPCT in the last 72 hours. Sepsis Labs: Recent Labs  Lab 03/25/19 0347  LATICACIDVEN 1.0    Recent Results (from the past 240 hour(s))  SARS Coronavirus 2 (CEPHEID - Performed in [facility] hospital lab), Hosp Order     Status: None   Collection Time: 03/21/19 10:55  AM  Result Value Ref Range Status   SARS Coronavirus 2 NEGATIVE NEGATIVE Final    Comment: (NOTE) If result is NEGATIVE SARS-CoV-2 target nucleic acids are NOT DETECTED. The SARS-CoV-2 RNA is generally detectable in upper and lower  respiratory specimens during the acute phase of infection. The lowest  concentration of SARS-CoV-2 viral copies this assay can detect is 250  copies / mL. A negative result does not preclude SARS-CoV-2 infection  and should  not be used as the sole basis for treatment or other  patient management decisions.  A negative result may occur with  improper specimen collection / handling, submission of specimen other  than nasopharyngeal swab, presence of viral mutation(s) within the  areas targeted by this assay, and inadequate number of viral copies  (<250 copies / mL). A negative result must be combined with clinical  observations, patient history, and epidemiological information. If result is POSITIVE SARS-CoV-2 target nucleic acids are DETECTED. The SARS-CoV-2 RNA is generally detectable in upper and lower  respiratory specimens dur ing the acute phase of infection.  Positive  results are indicative of active infection with SARS-CoV-2.  Clinical  correlation with patient history and other diagnostic information is  necessary to determine patient infection status.  Positive results do  not rule out bacterial infection or co-infection with other viruses. If result is PRESUMPTIVE POSTIVE SARS-CoV-2 nucleic acids MAY BE PRESENT.   A presumptive positive result was obtained on the submitted specimen  and confirmed on repeat testing.  While 2019 novel coronavirus  (SARS-CoV-2) nucleic acids may be present in the submitted sample  additional confirmatory testing may be necessary for epidemiological  and / or clinical management purposes  to differentiate between  SARS-CoV-2 and other Sarbecovirus currently known to infect humans.  If clinically indicated additional testing with an alternate test  methodology [phone]) is advised. The SARS-CoV-2 RNA is generally  detectable in upper and lower respiratory sp ecimens during the acute  phase of infection. The expected result is Negative. Fact Sheet for Patients:  StrictlyIdeas.no Fact Sheet for Healthcare Providers: BankingDealers.co.za This test is not yet approved or cleared by the [place] FDA and has been  authorized for detection and/or diagnosis of SARS-CoV-2 by FDA under an Emergency Use Authorization (EUA).  This EUA will remain in effect (meaning this test can be used) for the duration of the COVID-19 declaration under Section 564(b)(1) of the Act, 21 U.S.C. section 360bbb-3(b)(1), unless the authorization is terminated or revoked sooner. Performed at [facility], [place] [address], [place], [place] [address]   MRSA PCR Screening     Status: None   Collection Time: 03/22/19  4:56 PM  Result Value Ref Range Status   MRSA by PCR NEGATIVE NEGATIVE Final    Comment:        The GeneXpert MRSA Assay (FDA approved for NASAL specimens only), is one component of a comprehensive MRSA colonization surveillance program. It is not intended to diagnose MRSA infection nor to guide or monitor treatment for MRSA infections. Performed at [facility], [place] [address], [place], [place] [address]   Culture, Urine     Status: Abnormal   Collection Time: 03/24/19  9:12 AM  Result Value Ref Range Status   Specimen Description   Final    URINE, CATHETERIZED Performed at [place] [address], [place], [place] [address]    Special Requests   Final    Normal Performed at [facility], [place] [address] [place], [place] [address]    Culture 50,000 COLONIES/mL ESCHERICHIA COLI (A)  Final   Report Status 03/26/2019 FINAL  Final   Organism ID, Bacteria ESCHERICHIA COLI (A)  Final      Susceptibility   Escherichia coli - MIC*    AMPICILLIN <=2 SENSITIVE Sensitive     CEFAZOLIN <=4 SENSITIVE Sensitive     CEFTRIAXONE <=1 SENSITIVE Sensitive     CIPROFLOXACIN <=0.25 SENSITIVE Sensitive     GENTAMICIN <=1 SENSITIVE Sensitive     IMIPENEM <=0.25 SENSITIVE Sensitive     NITROFURANTOIN <=16 SENSITIVE Sensitive     TRIMETH/SULFA <=20 SENSITIVE Sensitive     AMPICILLIN/SULBACTAM <=2 SENSITIVE Sensitive     PIP/TAZO <=4  SENSITIVE Sensitive     Extended ESBL NEGATIVE Sensitive     * 50,000 COLONIES/mL ESCHERICHIA COLI  Culture, respiratory (non-expectorated)     Status: None   Collection Time: 03/24/19 10:00 AM  Result Value Ref Range Status   Specimen Description   Final    TRACHEAL ASPIRATE Performed at [place] [address] [place], [place] [address]    Special Requests   Final    Normal Performed at [facility], [place] [address], [place], [place] [address]    Gram Stain   Final    RARE WBC PRESENT,BOTH PMN AND MONONUCLEAR MODERATE GRAM POSITIVE COCCI FEW GRAM NEGATIVE RODS Performed at [facility], [place] [address] [place], [place] [address]    Culture MODERATE STAPHYLOCOCCUS AUREUS  Final   Report Status 03/27/2019 FINAL  Final   Organism ID, Bacteria STAPHYLOCOCCUS AUREUS  Final      Susceptibility   Staphylococcus aureus - MIC*    CIPROFLOXACIN <=0.5 SENSITIVE Sensitive     ERYTHROMYCIN <=0.25 SENSITIVE Sensitive     GENTAMICIN <=0.5 SENSITIVE Sensitive     OXACILLIN <=0.25 SENSITIVE Sensitive     TETRACYCLINE <=1 SENSITIVE Sensitive     VANCOMYCIN <=0.5 SENSITIVE Sensitive     TRIMETH/SULFA <=10 SENSITIVE Sensitive     CLINDAMYCIN <=0.25 SENSITIVE Sensitive     RIFAMPIN <=0.5 SENSITIVE Sensitive     Inducible Clindamycin NEGATIVE Sensitive     * MODERATE STAPHYLOCOCCUS AUREUS  Culture, blood (routine x 2)     Status: None (Preliminary result)   Collection Time: 03/24/19 10:39 AM  Result Value Ref Range Status   Specimen Description   Final    BLOOD RIGHT ARM Performed at [place] [address] [place], [place] [address]    Special Requests   Final    BOTTLES DRAWN AEROBIC ONLY Blood Culture adequate volume   Culture   Final    NO GROWTH 4 DAYS Performed at [facility], [place] [address] [place], [place] [address]    Report Status PENDING  Incomplete  Culture, blood (routine x 2)     Status: None  (Preliminary result)   Collection Time: 03/24/19 10:48 AM  Result Value Ref Range Status   Specimen Description   Final    BLOOD RIGHT WRIST Performed at [place] [address], [place], [place] [address]    Special Requests   Final    BOTTLES DRAWN AEROBIC ONLY Blood Culture adequate volume   Culture   Final    NO GROWTH 4 DAYS Performed at [facility], [place] [address], [place], [place] [address]    Report Status PENDING  Incomplete   Radiology Studies: Dg  Chest Port 1 View  Result Date: 03/27/2019 CLINICAL DATA:  43 year old female with history of shortness of breath. EXAM: PORTABLE CHEST 1 VIEW COMPARISON:  Chest x-ray 03/25/2019. FINDINGS: Lung volumes are normal. No consolidative airspace disease. No pleural effusions. No evidence of pulmonary edema. Mild cardiomegaly. Upper mediastinal contours are within normal limits. IMPRESSION: 1. No radiographic evidence of acute cardiopulmonary disease. 2. Mild cardiomegaly. Electronically Signed   By: Vinnie Langton M.D.   On: 03/27/2019 06:03   Scheduled Meds:  Chlorhexidine Gluconate Cloth  6 each Topical Daily   ferrous sulfate  325 mg Oral Q breakfast   guaiFENesin  1,200 mg Oral BID   insulin aspart  0-9 Units Subcutaneous TID AC & HS   insulin detemir  6 Units Subcutaneous BID   lactulose  10 g Oral TID   losartan  100 mg Oral Daily   pantoprazole  40 mg Oral Daily   sodium chloride flush  10-40 mL Intracatheter Q12H   sodium chloride flush  3 mL Intravenous Q12H   Continuous Infusions:  cefTRIAXone (ROCEPHIN)  IV 2 g (03/27/19 1704)    LOS: 7 days    Kerney Elbe, DO [facility] PAGER is on AMION  If 7PM-7AM, please contact night-coverage [email or web address] Password [email or web address] 03/28/2019, 1:53 PM

## 2019-03-28 NOTE — Progress Notes (Signed)
Subjective: No abdominal pain. Tolerating diet. No blood in stool or hematemesis.  Objective: Vital signs in last 24 hours: Temp:  [98.3 F (36.8 C)-99.3 F (37.4 C)] 98.6 F (37 C) (05/24 0425) Pulse Rate:  [83-99] 83 (05/24 0425) Resp:  [16-20] 20 (05/24 0425) BP: (135-158)/(95-102) 135/98 (05/24 0425) SpO2:  [95 %-97 %] 95 % (05/24 0425) Weight change:  Last BM Date: 03/27/19  PE: GEN:  Overweight, NAD ABD:  Protuberant, soft, non-tender  Lab Results: CBC    Component Value Date/Time   WBC 5.8 03/27/2019 0522   RBC 2.89 (L) 03/27/2019 0522   HGB 8.4 (L) 03/27/2019 0522   HGB 13.0 05/01/2017 0909   HCT 27.1 (L) 03/27/2019 0522   HCT 40.6 05/01/2017 0909   PLT 91 (L) 03/27/2019 0522   PLT 147 (L) 05/01/2017 0909   MCV 93.8 03/27/2019 0522   MCV 89 05/01/2017 0909   MCH 29.1 03/27/2019 0522   MCHC 31.0 03/27/2019 0522   RDW 16.2 (H) 03/27/2019 0522   RDW 14.4 05/01/2017 0909   LYMPHSABS 1.5 03/27/2019 0522   MONOABS 0.4 03/27/2019 0522   EOSABS 0.1 03/27/2019 0522   BASOSABS 0.0 03/27/2019 0522   CMP     Component Value Date/Time   NA 140 03/27/2019 0522   NA 139 05/01/2017 0909   K 3.3 (L) 03/27/2019 0522   CL 105 03/27/2019 0522   CO2 29 03/27/2019 0522   GLUCOSE 209 (H) 03/27/2019 0522   BUN 7 03/27/2019 0522   BUN 15 05/01/2017 0909   CREATININE 0.47 03/27/2019 0522   CREATININE 0.68 06/21/2016 1713   CALCIUM 8.2 (L) 03/27/2019 0522   PROT 6.7 03/27/2019 0522   PROT 7.9 05/01/2017 0909   ALBUMIN 2.9 (L) 03/27/2019 0522   ALBUMIN 4.4 05/01/2017 0909   AST 27 03/27/2019 0522   ALT 49 (H) 03/27/2019 0522   ALKPHOS 84 03/27/2019 0522   BILITOT 0.7 03/27/2019 0522   BILITOT 0.4 05/01/2017 0909   GFRNONAA >60 03/27/2019 0522   GFRNONAA >89 06/21/2016 1713   GFRAA >60 03/27/2019 0522   GFRAA >89 06/21/2016 1713   Assessment:  1.  Decompensated cirrhosis (suspected NASH). 2.  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  No further  active bleeding. 3.  Acute blood loss anemia, likely from #2 above.  Stable. 4.  Obesity.  Plan:  1.  Transition to oral PPI (pantoprazole 40 mg po qd). 2.  Stop octreotide drip. 3.  7-day total course of antibiotics (ceftriaxone; GI bleeding in patient with cirrhosis). 4.  Mobilize, ambulate as tolerated 5.  Hopefully able to discharge home within the next couple days. 6.  [facility] will follow.   Angela Castillo 03/28/2019, 10:20 AM   Cell [phone] If no answer or after 5 PM call [phone]

## 2019-03-29 DIAGNOSIS — I864 Gastric varices: Secondary | ICD-10-CM

## 2019-03-29 LAB — GLUCOSE, CAPILLARY
Glucose-Capillary: 147 mg/dL — ABNORMAL HIGH (ref 70–99)
Glucose-Capillary: 155 mg/dL — ABNORMAL HIGH (ref 70–99)
Glucose-Capillary: 160 mg/dL — ABNORMAL HIGH (ref 70–99)
Glucose-Capillary: 176 mg/dL — ABNORMAL HIGH (ref 70–99)

## 2019-03-29 LAB — CBC WITH DIFFERENTIAL/PLATELET
Abs Immature Granulocytes: 0.1 10*3/uL — ABNORMAL HIGH (ref 0.00–0.07)
Basophils Absolute: 0 10*3/uL (ref 0.0–0.1)
Basophils Relative: 0 %
Eosinophils Absolute: 0.2 10*3/uL (ref 0.0–0.5)
Eosinophils Relative: 3 %
HCT: 28.6 % — ABNORMAL LOW (ref 36.0–46.0)
Hemoglobin: 8.4 g/dL — ABNORMAL LOW (ref 12.0–15.0)
Immature Granulocytes: 1 %
Lymphocytes Relative: 27 %
Lymphs Abs: 1.9 10*3/uL (ref 0.7–4.0)
MCH: 28.2 pg (ref 26.0–34.0)
MCHC: 29.4 g/dL — ABNORMAL LOW (ref 30.0–36.0)
MCV: 96 fL (ref 80.0–100.0)
Monocytes Absolute: 0.4 10*3/uL (ref 0.1–1.0)
Monocytes Relative: 5 %
Neutro Abs: 4.5 10*3/uL (ref 1.7–7.7)
Neutrophils Relative %: 64 %
Platelets: 104 10*3/uL — ABNORMAL LOW (ref 150–400)
RBC: 2.98 MIL/uL — ABNORMAL LOW (ref 3.87–5.11)
RDW: 16.5 % — ABNORMAL HIGH (ref 11.5–15.5)
WBC: 7.1 10*3/uL (ref 4.0–10.5)
nRBC: 0 % (ref 0.0–0.2)

## 2019-03-29 LAB — CULTURE, BLOOD (ROUTINE X 2)
Culture: NO GROWTH
Culture: NO GROWTH
Special Requests: ADEQUATE
Special Requests: ADEQUATE

## 2019-03-29 LAB — COMPREHENSIVE METABOLIC PANEL
ALT: 51 U/L — ABNORMAL HIGH (ref 0–44)
AST: 46 U/L — ABNORMAL HIGH (ref 15–41)
Albumin: 3 g/dL — ABNORMAL LOW (ref 3.5–5.0)
Alkaline Phosphatase: 111 U/L (ref 38–126)
Anion gap: 9 (ref 5–15)
BUN: 8 mg/dL (ref 6–20)
CO2: 25 mmol/L (ref 22–32)
Calcium: 8.6 mg/dL — ABNORMAL LOW (ref 8.9–10.3)
Chloride: 104 mmol/L (ref 98–111)
Creatinine, Ser: 0.55 mg/dL (ref 0.44–1.00)
GFR calc Af Amer: >60 mL/min (ref >60)
GFR calc non Af Amer: >60 mL/min (ref >60)
Glucose, Bld: 192 mg/dL — ABNORMAL HIGH (ref 70–99)
Potassium: 4.1 mmol/L (ref 3.5–5.1)
Sodium: 138 mmol/L (ref 135–145)
Total Bilirubin: 0.7 mg/dL (ref 0.3–1.2)
Total Protein: 7.1 g/dL (ref 6.5–8.1)

## 2019-03-29 LAB — PHOSPHORUS: Phosphorus: 4.3 mg/dL (ref 2.5–4.6)

## 2019-03-29 LAB — MAGNESIUM: Magnesium: 2.1 mg/dL (ref 1.7–2.4)

## 2019-03-29 MED ORDER — INSULIN DETEMIR 100 UNIT/ML ~~LOC~~ SOLN: 6.0000 [IU] | Freq: Two times a day (BID) | SUBCUTANEOUS | 11 refills | Status: DC

## 2019-03-29 MED ORDER — LOSARTAN POTASSIUM 100 MG PO TABS: 100.0000 mg | ORAL_TABLET | Freq: Every day | ORAL | 0 refills | Status: DC

## 2019-03-29 MED ORDER — PANTOPRAZOLE SODIUM 40 MG PO TBEC: 40.0000 mg | DELAYED_RELEASE_TABLET | Freq: Every day | ORAL | 0 refills | Status: DC

## 2019-03-29 MED ORDER — CIPROFLOXACIN HCL 500 MG PO TABS: 500.0000 mg | ORAL_TABLET | Freq: Two times a day (BID) | ORAL | 0 refills | Status: AC

## 2019-03-29 MED ORDER — SENNOSIDES-DOCUSATE SODIUM 8.6-50 MG PO TABS: 1.0000 | ORAL_TABLET | Freq: Every evening | ORAL | 0 refills | Status: DC | PRN

## 2019-03-29 MED ORDER — HYDROCORTISONE (PERIANAL) 2.5 % EX CREA: TOPICAL_CREAM | Freq: Three times a day (TID) | CUTANEOUS | 0 refills | Status: DC | PRN

## 2019-03-29 MED ORDER — BLOOD GLUCOSE MONITOR KIT: PACK | 0 refills | Status: DC

## 2019-03-29 MED ORDER — INSULIN SYRINGES (DISPOSABLE) U-100 0.5 ML MISC: 1.0000 | Freq: Two times a day (BID) | 0 refills | Status: DC

## 2019-03-29 MED ORDER — INSULIN DETEMIR 100 UNIT/ML FLEXPEN: 6.0000 [IU] | PEN_INJECTOR | Freq: Every day | SUBCUTANEOUS | 0 refills | Status: DC

## 2019-03-29 MED ORDER — INSULIN PEN NEEDLE 31G X 5 MM MISC: 1.0000 | Freq: Two times a day (BID) | 0 refills | Status: DC

## 2019-03-29 MED ORDER — LIVING WELL WITH DIABETES BOOK: 1.0000 | Freq: Once | 0 refills | Status: AC

## 2019-03-29 MED ORDER — GUAIFENESIN ER 600 MG PO TB12: 600.0000 mg | ORAL_TABLET | Freq: Two times a day (BID) | ORAL | 0 refills | Status: DC

## 2019-03-29 MED ORDER — INSULIN STARTER KIT- PEN NEEDLES (ENGLISH)
1.0000 | Freq: Once | Status: AC
  Administered 2019-03-29: 17:00:00 1
  Filled 2019-03-29: qty 1

## 2019-03-29 MED ORDER — ONDANSETRON HCL 4 MG PO TABS: 4.0000 mg | ORAL_TABLET | Freq: Four times a day (QID) | ORAL | 0 refills | Status: DC | PRN

## 2019-03-29 MED ORDER — HYDROCOD POLST-CPM POLST ER 10-8 MG/5ML PO SUER: 5.0000 mL | Freq: Two times a day (BID) | ORAL | 0 refills | Status: DC | PRN

## 2019-03-29 MED ORDER — PROPRANOLOL HCL 20 MG PO TABS: 20.0000 mg | ORAL_TABLET | Freq: Two times a day (BID) | ORAL | 0 refills | Status: DC

## 2019-03-29 MED ORDER — BENZONATATE 200 MG PO CAPS: 200.0000 mg | ORAL_CAPSULE | Freq: Three times a day (TID) | ORAL | 0 refills | Status: DC | PRN

## 2019-03-29 MED ORDER — FERROUS SULFATE 325 (65 FE) MG PO TABS: 325.0000 mg | ORAL_TABLET | Freq: Two times a day (BID) | ORAL | 0 refills | Status: DC

## 2019-03-29 MED ORDER — BENZONATATE 100 MG PO CAPS: 100.0000 mg | ORAL_CAPSULE | Freq: Three times a day (TID) | ORAL | Status: DC | PRN

## 2019-03-29 MED ORDER — LIVING WELL WITH DIABETES BOOK
Freq: Once | Status: AC
  Administered 2019-03-29: 17:00:00
  Filled 2019-03-29: qty 1

## 2019-03-29 MED ORDER — INSULIN STARTER KIT- PEN NEEDLES (ENGLISH): 1.0000 | Freq: Once | 0 refills | Status: AC

## 2019-03-29 NOTE — Progress Notes (Signed)
Inpatient Diabetes Program Recommendations  AACE/ADA: New Consensus Statement on Inpatient Glycemic Control (2015)  Target Ranges:  Prepandial:   less than 140 mg/dL      Peak postprandial:   less than 180 mg/dL (1-2 hours)      Critically ill patients:  140 - 180 mg/dL   Lab Results  Component Value Date   GLUCAP 160 (H) 03/29/2019   HGBA1C 9.5 (H) 03/21/2019    Review of Glycemic Control  Blood sugars today: 147-192 mg/dL.  To be discharged on : Levemir 6 units bid (pen)  Taught pt insulin pen administration. Reviewed all steps if insulin pen including attachment of needle, 2-unit air shot, dialing up dose, giving injection, removing needle, disposal of sharps, storage of unused insulin, disposal of insulin etc. Patient able to provide successful return demonstration. Also reviewed troubleshooting with insulin pen. MD to give patient Rxs for insulin pens and insulin pen needles. Discussed basic pathophysiology of DM Type 2, basic home care, importance of checking CBGs and maintaining good CBG control to prevent long-term and short-term complications. Reviewed glucose and A1C goals and explained that patient will need to obtain a PCP to manage her diabetes.  Reviewed signs and symptoms of hyperglycemia and hypoglycemia along with treatment for both. Answered questions regarding lifestyle modifications of diet and exercise. Pt appears motivated to control her blood sugars. Discussed above with RN.  Thank you. Ailene Ards, RD, LDN, CDE Inpatient Diabetes Coordinator [phone]

## 2019-03-29 NOTE — Discharge Summary (Signed)
Physician Discharge Summary  Angela Castillo ZJQ:[id number] DOB: 1975-12-05 DOA: 03/21/2019  PCP: Patient, No Pcp Per  Admit date: 03/21/2019 Discharge date: 03/29/2019  Admitted From: Home Disposition: Home as PT recommending no Follow up   Recommendations for Outpatient Follow-up:  1. Follow up with PCP in 1-2 weeks 2. Follow up with Gastroenterology within 1-2 weeks  3. Please obtain CMP/CBC, Mag, Phos in one week 4. Repeat CXR in 3-6 weeks 5. Please follow up on the following pending results:  Home Health: No Equipment/Devices: None recommended by PT  Discharge Condition: Stable CODE STATUS: FULL CODE Diet recommendation: Soft Heart Healthy Diet   Brief/Interim Summary: Patient is a 43 year old morbidly obese Caucasian female with a past medical history significant for but not limited to suspected cirrhosis secondary to [place] who presented with a recent upper GI bleeding and underwent gastric variceal banding on 03/22/2018.  She was intubated for airway protection and extubated on 03/24/2019.  Subsequently it felt that she aspirated during her intubation and she spiked a temperature of 102 and sputum culture grew out gram-positive cocci and was Pan-sensitive Staph Aureus so she was placed on IV Unasyn.  She became hypotensive and was placed on Neo-Synephrine but this is been weaned off and was given multiple fluid boluses.  She is 5 L positive and diuresis was initiated and now she is off pressors.  She received IV Lasix 40 g twice daily yesterday and this is been stopped.  Gastroenterology is following and patient remains on octreotide drip along with a Protonix drip and hemoglobin/hematocrit dropped yesterday so transfused 1 unit PRBC.  She was transferred to the medical floor and is improving.  Still was having some dark stools so GI is recommended continuing PPI and octreotide drip until today and stopped Octreotide gtt and transitioned to po PPI.  Diet has been advanced to soft diet.   GI also recommending 7-day course of antibiotics and mobilization.  Patient was transitioned to p.o. ciprofloxacin at the recommendation of gastroenterology.    Hospitalization was complicated by her aspiration pneumonia and she will be sent on ciprofloxacin.  She is a lingering cough and tested negative for respiratory virus panel but was sent out with Tussionex and guaifenesin.  She is deemed medically stable to be discharged home at this time and she will be placed on propanolol 20 mg p.o. twice daily along with continue Protonix.  She will need to follow-up with gastroenterology within the coming weeks.  She was told to avoid NSAIDs at this time and she work with therapy and did not need any home health.  She will follow-up and establish with PCP within a week and she was given the [facility] seen a provider.  Prior to discharge she was educated on her new diagnosis of diabetes and the diabetes coordinator showed her how to use insulin FlexPen's and she will be discharged with the flex pens prior to discharge along with a diabetes education book and she will be referred to the ambulatory diabetic coordinator after discharge.    Discharge Diagnoses:  Principal Problem:   GI bleed Active Problems:   Anemia, iron deficiency   Essential hypertension   Obesity (BMI 30-39.9)   Esophageal reflux   Acute respiratory insufficiency   Respiratory failure (HCC)   Shock circulatory (HCC)  Acute Upper GI bleeding secondary to variceal bleed with gastric banding x14 in the setting of NASH and Cirrhosis; Acute Blood Loss  Anemia -Large Esophageal Varices Noted on EGD -Has had banding 2 separate occasions during the hospitalization -Hemoglobin/hematocrit went from 7.0/22.5 -> 7.3/24.0 -> 7.1/23.6 -> 6.4/21.3 -> 7.7/25.7 (after 1 unit of pRBC) -> 8.4/27.1 -> 8.9/29.0 -> 8.4/28.6 -Gastroenterology is following  -Transfuse 1 unit PRBCs on  the night of 5/21-5/22 and hemoglobin/hematocrit is improved. -Patient is status post 4 units of PRBCs total -Continued with Protonix and Octreotide drips but now stopped per GI Recc's and transitioned to po PPI Pantoprazole 40 mg Daily  -GI recommending supportive care and diet is being advanced from full liquid diet to a soft diet; Tolerating Soft Diety  -Continue to monitor for signs and symptoms of bleeding; patient still having dark stools -GI is recommending a total of 7 days of antibiotics given her liver cirrhosis today is Day 6 and will changed IV Unasyn to IV Ceftriaxone for coverage; Will D/C on po Ciprofloxacin 500 mg po BID  -Repeat CBC in the a.m. -Patient is hemodynamically stable  -PT/OT to evaluate and treat and recommending no Follow up -Will need to see PCP and Gastroenterology within 1-2 weeks    Acute Respiratory Failure status post extubation for banding procedure, improved -She is improved and now off of oxygen as after she is extubated -She was also diuresed with IV Lasix twice daily during hospitalization; Now saturating well on Room Air  -Will need to continue monitor respiratory status carefully -Repeat CXR every other day and repeat in AM  -Continue with albuterol 2.5 mg nebs every 2 PRN for wheezing -Repeat CXR on 03/27/2019 howed "Lung volumes are normal. No consolidative airspace disease. No pleural effusions. No evidence of pulmonary edema. Mild cardiomegaly. Upper mediastinal contours are within normal limits." -Repeat CXR in 3-6 Weeks  Aspiration Pneumonia -Suspected to have aspirated during intubation as she had a temperature of 102 but is now improved  -C/w Pulmonary Toilet -Sputum cultures is growing gram-positive cocci  And showed Moderate Staphylococcus Aureus so she was placed on IV Unasyn; Will change to IV Ceftriaxone given Pansensitive Staph Aureus and will also need to cover Anaerobes; Today is Day 6/7 of Abx Coverage and will send out on 2 more  Days of Ciprofloxacin for GI Coverage -Continue Albuterol 2.5 mg nebs every 2 hours PRN for wheezing -Swallow Evaluation done and recommending Thin Liquids -Repeat CXR as above -Blood Cx x2 showed NGTD at 5 Days  -Repeat CXR in 3-6 Weeks   HTN and recent Hypotension; Hypotension has resolved  -No longer on Neo-synephrine -Was Holding home Cozaar but will resume now; Takes 100 mg po Daily  -BP this AM was 141/85 -Follow up with PCP   Hyperglycemia in the setting of Uncontrolled Diabetes Mellitus Type 2 -Patient's CBG's ranging from 147-261 -Added Levemir 6 units BID and will continue at D/C  -C/w Sensitive Novolog SSI AC/HS -Checked HbA1c and was 9.5 -Diabetes Education Coordinator Consulted and Following and appreciate Recommendations -Will need Further Diabetic Teaching and this was done today   Hematuria in the Setting of Menstruation vs. ? E Coli UTI -We will obtain urinalysis -Patient did have a urine culture done on 03/24/2019 which did show E. coli with 50,000 colonies that was pansensitive but her Urine was Catheterized -We will repeat a urinalysis currently and patient is already on IV Unasyn so should cover if she did have urinary tract infection -We will continue to monitor for signs and symptoms of bleeding -If persists will consult urology but patient believes that she may be on her period  as her last cycle was 02/16/2019 -Repeat Urinalysis not done  -Continue to Monitor Closely  Liver Cirrhosis (Decompensated) likely from NASH in the setting of Morbid Obesity Abnormal LFTs -GI Following -Continued Lactulose 10 grams TID but will stop per GI recommendations -Discussed with Gastroenterology and recommends Propanolol or Nadolol eventually and likely can be started just prior to D/C or as an outpatient; Dr. Paulita Fujita recommending 20 mg Propanolol BID at D/C  -Patient's LFTs  were trending down and AST went from 78 -> 49 -> 30 -> 27 but have now started trending back up  again as AST is now 48 -> 46 -ALT went from 93 -> 86 -> 60 -> 49 -> 50 -> 51 -GI Recommending Abx for 7 Days total and changed IV Unasyn to IV Ceftriaxone (Day 6 of Abx) and will D/C on po Ciprofloxacin  -Continue to Monitor and Trend LFTs as an outpatient  -Repeat CMP as an outpatient   Hypokalemia -Patient potassium is morning was 4.1 -Cardiac Monitoring discontinued  -Continue monitor replete as necessary -CMP as an outpatient   Hypophosphatemia -Patient's phosphorus levels morning was 4.3 -Continue monitor replete as necessary -Repeat phosphorus level as an outpatient   Morbid Obesity -Estimated body mass index is 41.96 kg/m as calculated from the following:   Height as of this encounter: 5' 3"  (1.6 m).   Weight as of this encounter: 107.5 kg. -Weight Loss and Dietary Counseling given  -Nutritionist on Board and Diabetic Teaching received   Thrombocytopenia -Patient's Platelet Count low in the setting of Cirrhosis and GI Bleeding -Platelet Count went from 116 -> 106 -> 84 -> 91 -> 104 -Continue to Monitor and Trend -Repeat CBC in as an outpatient   Discharge Instructions  Discharge Instructions    Ambulatory referral to Nutrition and Diabetic Education   Complete by:  As directed    Call MD for:  difficulty breathing, headache or visual disturbances   Complete by:  As directed    Call MD for:  extreme fatigue   Complete by:  As directed    Call MD for:  hives   Complete by:  As directed    Call MD for:  persistant dizziness or light-headedness   Complete by:  As directed    Call MD for:  persistant nausea and vomiting   Complete by:  As directed    Call MD for:  redness, tenderness, or signs of infection (pain, swelling, redness, odor or green/yellow discharge around incision site)   Complete by:  As directed    Call MD for:  severe uncontrolled pain   Complete by:  As directed    Call MD for:  temperature >100.4   Complete by:  As directed    Diet - low  sodium heart healthy   Complete by:  As directed    SOFT DIET   Diet Carb Modified   Complete by:  As directed    SOFT DIET   Discharge instructions   Complete by:  As directed    You were cared for by a hospitalist during your hospital stay. If you have any questions about your discharge medications or the care you received while you were in the hospital after you are discharged, you can call the unit and ask to speak with the hospitalist on call if the hospitalist that took care of you is not available. Once you are discharged, your primary care physician will handle any further medical issues. Please note that NO REFILLS for  any discharge medications will be authorized once you are discharged, as it is imperative that you return to your primary care physician (or establish a relationship with a primary care physician if you do not have one) for your aftercare needs so that they can reassess your need for medications and monitor your lab values.  Follow up and establish with PCP and follow up with Gastroenterology. Take all medications as prescribed. If symptoms change or worsen please return to the ED for evaluation. If cough does not improve have PCP refer to Pulmonary for further workup.   Increase activity slowly   Complete by:  As directed      Allergies as of 03/29/2019   No Known Allergies     Medication List    STOP taking these medications   ibuprofen 200 MG tablet Commonly known as:  ADVIL   ibuprofen 600 MG tablet Commonly known as:  ADVIL   meloxicam 15 MG tablet Commonly known as:  MOBIC   methocarbamol 500 MG tablet Commonly known as:  ROBAXIN   traMADol 50 MG tablet Commonly known as:  ULTRAM     TAKE these medications   benzonatate 200 MG capsule Commonly known as:  TESSALON Take 1 capsule (200 mg total) by mouth 3 (three) times daily as needed for cough.   blood glucose meter kit and supplies Kit Dispense based on patient and insurance preference. Use up  to four times daily as directed. (FOR ICD-9 250.00, 250.01).   chlorpheniramine-HYDROcodone 10-8 MG/5ML Suer Commonly known as:  TUSSIONEX Take 5 mLs by mouth every 12 (twelve) hours as needed for cough.   ciprofloxacin 500 MG tablet Commonly known as:  Cipro Take 1 tablet (500 mg total) by mouth 2 (two) times daily for 2 days.   ferrous sulfate 325 (65 FE) MG tablet Take 1 tablet (325 mg total) by mouth 2 (two) times daily with a meal.   guaiFENesin 600 MG 12 hr tablet Commonly known as:  MUCINEX Take 1 tablet (600 mg total) by mouth 2 (two) times daily.   hydrocortisone 2.5 % rectal cream Commonly known as:  ANUSOL-HC Place rectally 3 (three) times daily as needed for hemorrhoids or anal itching.   Insulin Detemir 100 UNIT/ML Pen Commonly known as:  Levemir FlexTouch Inject 6 Units into the skin daily.   Insulin Pen Needle 31G X 5 MM Misc 1 Container by Does not apply route 2 (two) times a day.   insulin starter kit- pen needles Misc 1 kit by Other route once for 1 dose.   living well with diabetes book Misc 1 each by Does not apply route once for 1 dose.   losartan 100 MG tablet Commonly known as:  Cozaar Take 1 tablet (100 mg total) by mouth daily. What changed:  Another medication with the same name was removed. Continue taking this medication, and follow the directions you see here.   ondansetron 4 MG tablet Commonly known as:  ZOFRAN Take 1 tablet (4 mg total) by mouth every 6 (six) hours as needed for nausea.   pantoprazole 40 MG tablet Commonly known as:  PROTONIX Take 1 tablet (40 mg total) by mouth daily. Start taking on:  Mar 30, 2019   propranolol 20 MG tablet Commonly known as:  INDERAL Take 1 tablet (20 mg total) by mouth 2 (two) times daily.   senna-docusate 8.6-50 MG tablet Commonly known as:  Senokot-S Take 1 tablet by mouth at bedtime as needed for mild constipation.  Durable Medical Equipment  (From admission, onward)          Start     Ordered   03/29/19 1705  For home use only DME 3 n 1  Once     03/29/19 1704         Follow-up Information    Wilford Corner, MD. Call.   Specialty:  Gastroenterology Why:  Follow up within 1-2 weeks of D/C Contact information: [address] [address] [phone]        [place]. Call.   Why:  Follow up within 1 week of Discharge and follow up for repeat Hemoglobin A1c and Diabetes Management  Contact information: [place] [address] [phone]         No Known Allergies  Consultations:  PCCM Transfer  Gastroenterology  Procedures/Studies: Dg Chest Port 1 View  Result Date: 03/27/2019 CLINICAL DATA:  43 year old female with history of shortness of breath. EXAM: PORTABLE CHEST 1 VIEW COMPARISON:  Chest x-ray 03/25/2019. FINDINGS: Lung volumes are normal. No consolidative airspace disease. No pleural effusions. No evidence of pulmonary edema. Mild cardiomegaly. Upper mediastinal contours are within normal limits. IMPRESSION: 1. No radiographic evidence of acute cardiopulmonary disease. 2. Mild cardiomegaly. Electronically Signed   By: Vinnie Langton M.D.   On: 03/27/2019 06:03   Dg Chest Port 1 View  Result Date: 03/25/2019 CLINICAL DATA:  Respiratory failure EXAM: PORTABLE CHEST 1 VIEW COMPARISON:  03/24/2019 FINDINGS: Cardiac shadow is mildly enlarged but stable. Left jugular central line is noted in satisfactory position. The endotracheal tube is been removed. No focal infiltrate or sizable effusion is seen. No bony abnormality is noted. IMPRESSION: No acute abnormality seen. Electronically Signed   By: Inez Catalina M.D.   On: 03/25/2019 07:44   Dg Chest Port 1 View  Result Date: 03/24/2019 CLINICAL DATA:  Follow-up respiratory failure. EXAM: PORTABLE CHEST 1 VIEW COMPARISON:  Mar 23, 2019 FINDINGS: The heart size and mediastinal contours are stable.  The heart size is enlarged. Endotracheal tube is identified distal tip 2 cm from carina unchanged compared prior exam. Left central venous line is identified with distal tip in superior vena cava. Minimal atelectasis of left lung base is identified. There is no focal infiltrate, pulmonary edema, or pleural effusion. The visualized skeletal structures are unremarkable. IMPRESSION: Minimal atelectasis of left lung base. No focal pneumonia or pulmonary edema. Life supporting devices are unchanged. Electronically Signed   By: Abelardo Diesel M.D.   On: 03/24/2019 11:58   Dg Chest Port 1 View  Result Date: 03/23/2019 CLINICAL DATA:  Central line placement. EXAM: PORTABLE CHEST 1 VIEW COMPARISON:  03/23/2019. FINDINGS: LEFT IJ catheter has been placed, and lies with its tip at the cavoatrial junction. No pneumothorax. Cardiomegaly redemonstrated. Bibasilar aeration is slightly improved. Unchanged ET tube. IMPRESSION: LEFT IJ catheter tip cavoatrial junction. No pneumothorax. Electronically Signed   By: Staci Righter M.D.   On: 03/23/2019 12:42   Portable Chest Xray  Result Date: 03/23/2019 CLINICAL DATA:  Respiratory insufficiency EXAM: PORTABLE CHEST 1 VIEW COMPARISON:  Yesterday FINDINGS: Endotracheal tube tip at the clavicular heads. Cardiomegaly. Improved aeration with decreased streaky opacities asymmetric to the left. No Kerley lines, effusion, or pneumothorax. IMPRESSION: 1. Bilateral atelectasis or pneumonia. 2. Improved left upper lobe inflation after endotracheal tube repositioning. Electronically Signed   By: Monte Fantasia M.D.   On: 03/23/2019 08:11   Dg Chest [facility]  Result Date: 03/22/2019 CLINICAL DATA:  Check endotracheal tube placement EXAM: PORTABLE CHEST 1 VIEW COMPARISON:  03/21/2019 FINDINGS: Cardiac shadow is mildly prominent but similar to that seen on the previous day. Endotracheal tube is now seen within the right mainstem bronchus with new left basilar and left upper lobe  atelectatic changes. This should be withdrawn approximately 3 cm. The right lung is clear. No bony abnormality is noted. IMPRESSION: Endotracheal tube within the right mainstem bronchus. This should be withdrawn approximately 3 cm. Patchy atelectatic changes are noted in the left lung new from the prior exam consistent with the right mainstem intubation. Critical Value/emergent results were called by telephone at the time of interpretation on 03/22/2019 at 2:53 pm to Wells Guiles, the patient's nurse, who verbally acknowledged these results. Electronically Signed   By: Inez Catalina M.D.   On: 03/22/2019 14:54   Dg Chest Portable 1 View  Result Date: 03/21/2019 CLINICAL DATA:  Large volume hematemesis EXAM: PORTABLE CHEST 1 VIEW COMPARISON:  08/16/2015 FINDINGS: Lungs are clear.  No pleural effusion or pneumothorax. The heart is normal in size.  No evidence of pneumomediastinum. IMPRESSION: No evidence of acute cardiopulmonary disease. No evidence of pneumomediastinum. Electronically Signed   By: Julian Hy M.D.   On: 03/21/2019 10:37   EGD 03/23/19 Findings: Three columns of oozing large (>5 mm) varices were found in the lower  third of the esophagus,. Stigmata of recent bleeding were evident and  red wale signs were present. Six bands were successfully placed with  incomplete eradication of varices. Bleeding had stopped at the end of  the procedure. Due to fear of dislodging previouslyplaced bands endoscope was not  advanced distal to lowest placed bands. A large protuberant nipple sign  was noted and it was banded. Impression: - Bleeding large (>5 mm) esophageal varices.  Incompletely eradicated. Banded. - No specimens collected. Moderate Sedation: See the other procedure note for documentation of moderate sedation with  intraservice time.  Subjective: Seen and examined at  bedside states that she is doing much better.  Denies any chest pain, lightheadedness or dizziness and states stools were formed but not very bloody.  No nausea or vomiting her only complaint was her cough which was affecting her abdomen and causing some soreness.  No other concerns or plans at this time ready to go home.  Discharge Exam: Vitals:   03/28/19 2119 03/29/19 0350  BP: (!) 143/94 (!) 141/85  Pulse: 77 95  Resp: 20 18  Temp: 98.9 F (37.2 C) 98.9 F (37.2 C)  SpO2: 98% 97%   Vitals:   03/28/19 0425 03/28/19 1438 03/28/19 2119 03/29/19 0350  BP: (!) 135/98 139/85 (!) 143/94 (!) 141/85  Pulse: 83 100 77 95  Resp: _0 Temp: 98.6 F (37 C) 98.3 F (36.8 C) 98.9 F (37.2 C) 98.9 F (37.2 C)  TempSrc: Oral Oral Oral Oral  SpO2: 95% 97% 98% 97%  Weight:    107.5 kg  Height:       General: Pt is alert, awake, not in acute distress Cardiovascular: RRR, S1/S2 +, no rubs, no gallops Respiratory: Diminished bilaterally, no wheezing, no rhonchi Abdominal: Soft, NT, Distended 2/2 body habitus, bowel sounds + Extremities: Trace edema, no cyanosis  The results of significant diagnostics from this hospitalization (including imaging, microbiology, ancillary and laboratory) are listed below for reference.    Microbiology: Recent Results (from the past 240 hour(s))  SARS Coronavirus 2 (CEPHEID - Performed in [facility] hospital lab),  Hosp Order     Status: None   Collection Time: 03/21/19 10:55 AM  Result Value Ref Range Status   SARS Coronavirus 2 NEGATIVE NEGATIVE Final    Comment: (NOTE) If result is NEGATIVE SARS-CoV-2 target nucleic acids are NOT DETECTED. The SARS-CoV-2 RNA is generally detectable in upper and lower  respiratory specimens during the acute phase of infection. The lowest  concentration of SARS-CoV-2 viral copies this assay can detect is 250  copies / mL. A negative result does not preclude SARS-CoV-2 infection  and should not be used as the sole  basis for treatment or other  patient management decisions.  A negative result may occur with  improper specimen collection / handling, submission of specimen other  than nasopharyngeal swab, presence of viral mutation(s) within the  areas targeted by this assay, and inadequate number of viral copies  (<250 copies / mL). A negative result must be combined with clinical  observations, patient history, and epidemiological information. If result is POSITIVE SARS-CoV-2 target nucleic acids are DETECTED. The SARS-CoV-2 RNA is generally detectable in upper and lower  respiratory specimens dur ing the acute phase of infection.  Positive  results are indicative of active infection with SARS-CoV-2.  Clinical  correlation with patient history and other diagnostic information is  necessary to determine patient infection status.  Positive results do  not rule out bacterial infection or co-infection with other viruses. If result is PRESUMPTIVE POSTIVE SARS-CoV-2 nucleic acids MAY BE PRESENT.   A presumptive positive result was obtained on the submitted specimen  and confirmed on repeat testing.  While 2019 novel coronavirus  (SARS-CoV-2) nucleic acids may be present in the submitted sample  additional confirmatory testing may be necessary for epidemiological  and / or clinical management purposes  to differentiate between  SARS-CoV-2 and other Sarbecovirus currently known to infect humans.  If clinically indicated additional testing with an alternate test  methodology [phone]) is advised. The SARS-CoV-2 RNA is generally  detectable in upper and lower respiratory sp ecimens during the acute  phase of infection. The expected result is Negative. Fact Sheet for Patients:  StrictlyIdeas.no Fact Sheet for Healthcare Providers: BankingDealers.co.za This test is not yet approved or cleared by the [place] FDA and has been authorized for detection  and/or diagnosis of SARS-CoV-2 by FDA under an Emergency Use Authorization (EUA).  This EUA will remain in effect (meaning this test can be used) for the duration of the COVID-19 declaration under Section 564(b)(1) of the Act, 21 U.S.C. section 360bbb-3(b)(1), unless the authorization is terminated or revoked sooner. Performed at [facility], [place] [address] [place], [place] [address]   MRSA PCR Screening     Status: None   Collection Time: 03/22/19  4:56 PM  Result Value Ref Range Status   MRSA by PCR NEGATIVE NEGATIVE Final    Comment:        The GeneXpert MRSA Assay (FDA approved for NASAL specimens only), is one component of a comprehensive MRSA colonization surveillance program. It is not intended to diagnose MRSA infection nor to guide or monitor treatment for MRSA infections. Performed at [facility], [place] [address] [place], [place] [address]   Culture, Urine     Status: Abnormal   Collection Time: 03/24/19  9:12 AM  Result Value Ref Range Status   Specimen Description   Final    URINE, CATHETERIZED Performed at [facility] [address] [place], [place] [address]  Special Requests   Final    Normal Performed at [facility] [facility], [place] [address], [place], [place] [address]    Culture 50,000 COLONIES/mL ESCHERICHIA COLI (A)  Final   Report Status 03/26/2019 FINAL  Final   Organism ID, Bacteria ESCHERICHIA COLI (A)  Final      Susceptibility   Escherichia coli - MIC*    AMPICILLIN <=2 SENSITIVE Sensitive     CEFAZOLIN <=4 SENSITIVE Sensitive     CEFTRIAXONE <=1 SENSITIVE Sensitive     CIPROFLOXACIN <=0.25 SENSITIVE Sensitive     GENTAMICIN <=1 SENSITIVE Sensitive     IMIPENEM <=0.25 SENSITIVE Sensitive     NITROFURANTOIN <=16 SENSITIVE Sensitive     TRIMETH/SULFA <=20 SENSITIVE Sensitive     AMPICILLIN/SULBACTAM <=2 SENSITIVE Sensitive     PIP/TAZO <=4 SENSITIVE Sensitive      Extended ESBL NEGATIVE Sensitive     * 50,000 COLONIES/mL ESCHERICHIA COLI  Culture, respiratory (non-expectorated)     Status: None   Collection Time: 03/24/19 10:00 AM  Result Value Ref Range Status   Specimen Description   Final    TRACHEAL ASPIRATE Performed at [facility] [address], [place], [place] [address]    Special Requests   Final    Normal Performed at [facility], [place] [address] [place], [place] [address]    Gram Stain   Final    RARE WBC PRESENT,BOTH PMN AND MONONUCLEAR MODERATE GRAM POSITIVE COCCI FEW GRAM NEGATIVE RODS Performed at [facility], [place] [address], [place], [place] [address]    Culture MODERATE STAPHYLOCOCCUS AUREUS  Final   Report Status 03/27/2019 FINAL  Final   Organism ID, Bacteria STAPHYLOCOCCUS AUREUS  Final      Susceptibility   Staphylococcus aureus - MIC*    CIPROFLOXACIN <=0.5 SENSITIVE Sensitive     ERYTHROMYCIN <=0.25 SENSITIVE Sensitive     GENTAMICIN <=0.5 SENSITIVE Sensitive     OXACILLIN <=0.25 SENSITIVE Sensitive     TETRACYCLINE <=1 SENSITIVE Sensitive     VANCOMYCIN <=0.5 SENSITIVE Sensitive     TRIMETH/SULFA <=10 SENSITIVE Sensitive     CLINDAMYCIN <=0.25 SENSITIVE Sensitive     RIFAMPIN <=0.5 SENSITIVE Sensitive     Inducible Clindamycin NEGATIVE Sensitive     * MODERATE STAPHYLOCOCCUS AUREUS  Culture, blood (routine x 2)     Status: None   Collection Time: 03/24/19 10:39 AM  Result Value Ref Range Status   Specimen Description   Final    BLOOD RIGHT ARM Performed at [facility] [address] [place], [place] [address]    Special Requests   Final    BOTTLES DRAWN AEROBIC ONLY Blood Culture adequate volume   Culture   Final    NO GROWTH 5 DAYS Performed at [facility], [place] [address] [place], [place] [address]    Report Status 03/29/2019 FINAL  Final  Culture, blood (routine x 2)     Status: None   Collection Time: 03/24/19 10:48 AM   Result Value Ref Range Status   Specimen Description   Final    BLOOD RIGHT WRIST Performed at [facility] [address], [place], [place] [address]    Special Requests   Final    BOTTLES DRAWN AEROBIC ONLY Blood Culture adequate volume   Culture   Final    NO GROWTH 5 DAYS Performed at [facility], [place] [address] [place],  [address]  Report Status 03/29/2019 FINAL  Final  Respiratory Panel by PCR     Status: None   Collection Time: 03/28/19  3:22 PM  Result Value Ref Range Status   Adenovirus NOT DETECTED NOT DETECTED Final   Coronavirus 229E NOT DETECTED NOT DETECTED Final    Comment: (NOTE) The Coronavirus on the Respiratory Panel, DOES NOT test for the novel  Coronavirus (2019 nCoV)    Coronavirus HKU1 NOT DETECTED NOT DETECTED Final   Coronavirus NL63 NOT DETECTED NOT DETECTED Final   Coronavirus OC43 NOT DETECTED NOT DETECTED Final   Metapneumovirus NOT DETECTED NOT DETECTED Final   Rhinovirus / Enterovirus NOT DETECTED NOT DETECTED Final   Influenza A NOT DETECTED NOT DETECTED Final   Influenza B NOT DETECTED NOT DETECTED Final   Parainfluenza Virus 1 NOT DETECTED NOT DETECTED Final   Parainfluenza Virus 2 NOT DETECTED NOT DETECTED Final   Parainfluenza Virus 3 NOT DETECTED NOT DETECTED Final   Parainfluenza Virus 4 NOT DETECTED NOT DETECTED Final   Respiratory Syncytial Virus NOT DETECTED NOT DETECTED Final   Bordetella pertussis NOT DETECTED NOT DETECTED Final   Chlamydophila pneumoniae NOT DETECTED NOT DETECTED Final   Mycoplasma pneumoniae NOT DETECTED NOT DETECTED Final    Comment: Performed at [facility], [address], [place], [place] [address]   Labs: BNP (last 3 results) Recent Labs    03/21/19 1257 03/25/19 0347  BNP 29.1 276.7*   Basic Metabolic Panel: Recent Labs  Lab 03/25/19 0347 03/26/19 0400 03/27/19 0522 03/28/19 1020 03/29/19 0450  NA 146* 142 140 138 138  K 3.1* 3.0* 3.3* 4.0 4.1  CL  114* 106 105 102 104  CO2 _0 GLUCOSE 223* 218* 209* 257* 192*  BUN _1 CREATININE 0.52 0.46 0.47 0.60 0.55  CALCIUM 7.6* 7.7* 8.2* 8.4* 8.6*  MG 2.1 1.9 2.2 1.9 2.1  PHOS 1.7* 1.9* 3.1 3.7 4.3   Liver Function Tests: Recent Labs  Lab 03/25/19 0347 03/26/19 0400 03/27/19 0522 03/28/19 1020 03/29/19 0450  AST 49* 30 27 48* 46*  ALT 86* 60* 49* 50* 51*  ALKPHOS 70 70 84 100 111  BILITOT 0.8 0.7 0.7 0.7 0.7  PROT 6.3* 6.2* 6.7 7.0 7.1  ALBUMIN 2.7* 2.7* 2.9* 3.0* 3.0*   No results for input(s): LIPASE, AMYLASE in the last 168 hours. No results for input(s): AMMONIA in the last 168 hours. CBC: Recent Labs  Lab 03/24/19 1351  03/25/19 0347 03/26/19 0400 03/26/19 1052 03/27/19 0522 03/28/19 1020 03/29/19 0450  WBC 9.3   < > 11.7* 7.5  --  5.8 6.5 7.1  NEUTROABS 7.4  --   --   --   --  3.6 4.5 4.5  HGB 7.0*   < > 7.1* 6.4* 7.7* 8.4* 8.9* 8.4*  HCT 22.5*   < > 23.6* 21.3* 25.7* 27.1* 29.0* 28.6*  MCV 93.8   < > 95.5 95.5  --  93.8 94.8 96.0  PLT 89*   < > 106* 84*  --  91* 104* 104*   < > = values in this interval not displayed.   Cardiac Enzymes: Recent Labs  Lab 03/24/19 1011  TROPONINI <0.03   BNP: Invalid input(s): POCBNP CBG: Recent Labs  Lab 03/28/19 2045 03/29/19 0016 03/29/19 0348 03/29/19 0735 03/29/19 1158  GLUCAP 173* 155* 176* 147* 160*   D-Dimer No results for input(s): DDIMER in the last 72 hours. Hgb A1c No results for  input(s): HGBA1C in the last 72 hours. Lipid Profile No results for input(s): CHOL, HDL, LDLCALC, TRIG, CHOLHDL, LDLDIRECT in the last 72 hours. Thyroid function studies No results for input(s): TSH, T4TOTAL, T3FREE, THYROIDAB in the last 72 hours.  Invalid input(s): FREET3 Anemia work up No results for input(s): VITAMINB12, FOLATE, FERRITIN, TIBC, IRON, RETICCTPCT in the last 72 hours. Urinalysis    Component Value Date/Time   COLORURINE YELLOW 03/21/2019 0940   APPEARANCEUR CLEAR 03/21/2019 0940    LABSPEC 1.022 03/21/2019 0940   PHURINE 8.0 03/21/2019 0940   GLUCOSEU >=500 (A) 03/21/2019 0940   HGBUR NEGATIVE 03/21/2019 0940   BILIRUBINUR NEGATIVE 03/21/2019 0940   BILIRUBINUR small 02/06/2017 1633   KETONESUR 5 (A) 03/21/2019 0940   PROTEINUR NEGATIVE 03/21/2019 0940   UROBILINOGEN 1.0 02/06/2017 1633   UROBILINOGEN 1.0 01/03/2015 1138   NITRITE NEGATIVE 03/21/2019 0940   LEUKOCYTESUR NEGATIVE 03/21/2019 0940   Sepsis Labs Invalid input(s): PROCALCITONIN,  WBC,  LACTICIDVEN Microbiology Recent Results (from the past 240 hour(s))  SARS Coronavirus 2 (CEPHEID - Performed in [facility] lab), Hosp Order     Status: None   Collection Time: 03/21/19 10:55 AM  Result Value Ref Range Status   SARS Coronavirus 2 NEGATIVE NEGATIVE Final    Comment: (NOTE) If result is NEGATIVE SARS-CoV-2 target nucleic acids are NOT DETECTED. The SARS-CoV-2 RNA is generally detectable in upper and lower  respiratory specimens during the acute phase of infection. The lowest  concentration of SARS-CoV-2 viral copies this assay can detect is 250  copies / mL. A negative result does not preclude SARS-CoV-2 infection  and should not be used as the sole basis for treatment or other  patient management decisions.  A negative result may occur with  improper specimen collection / handling, submission of specimen other  than nasopharyngeal swab, presence of viral mutation(s) within the  areas targeted by this assay, and inadequate number of viral copies  (<250 copies / mL). A negative result must be combined with clinical  observations, patient history, and epidemiological information. If result is POSITIVE SARS-CoV-2 target nucleic acids are DETECTED. The SARS-CoV-2 RNA is generally detectable in upper and lower  respiratory specimens dur ing the acute phase of infection.  Positive  results are indicative of active infection with SARS-CoV-2.  Clinical  correlation with patient history and  other diagnostic information is  necessary to determine patient infection status.  Positive results do  not rule out bacterial infection or co-infection with other viruses. If result is PRESUMPTIVE POSTIVE SARS-CoV-2 nucleic acids MAY BE PRESENT.   A presumptive positive result was obtained on the submitted specimen  and confirmed on repeat testing.  While 2019 novel coronavirus  (SARS-CoV-2) nucleic acids may be present in the submitted sample  additional confirmatory testing may be necessary for epidemiological  and / or clinical management purposes  to differentiate between  SARS-CoV-2 and other Sarbecovirus currently known to infect humans.  If clinically indicated additional testing with an alternate test  methodology [id number]) is advised. The SARS-CoV-2 RNA is generally  detectable in upper and lower respiratory sp ecimens during the acute  phase of infection. The expected result is Negative. Fact Sheet for Patients:  StrictlyIdeas.no Fact Sheet for Healthcare Providers: BankingDealers.co.za This test is not yet approved or cleared by the [place] FDA and has been authorized for detection and/or diagnosis of SARS-CoV-2 by FDA under an Emergency Use Authorization (EUA).  This EUA will remain in effect (meaning this test can  be used) for the duration of the COVID-19 declaration under Section 564(b)(1) of the Act, 21 U.S.C. section 360bbb-3(b)(1), unless the authorization is terminated or revoked sooner. Performed at [facility], [place] [address] [place], [place] [address]   MRSA PCR Screening     Status: None   Collection Time: 03/22/19  4:56 PM  Result Value Ref Range Status   MRSA by PCR NEGATIVE NEGATIVE Final    Comment:        The GeneXpert MRSA Assay (FDA approved for NASAL specimens only), is one component of a comprehensive MRSA colonization surveillance program. It is not intended to diagnose  MRSA infection nor to guide or monitor treatment for MRSA infections. Performed at [facility], [place] [address] [place], [place] [address]   Culture, Urine     Status: Abnormal   Collection Time: 03/24/19  9:12 AM  Result Value Ref Range Status   Specimen Description   Final    URINE, CATHETERIZED Performed at [facility] [address], [place], [place] [address]    Special Requests   Final    Normal Performed at [facility], [place] [address] [place], [place] [address]    Culture 50,000 COLONIES/mL ESCHERICHIA COLI (A)  Final   Report Status 03/26/2019 FINAL  Final   Organism ID, Bacteria ESCHERICHIA COLI (A)  Final      Susceptibility   Escherichia coli - MIC*    AMPICILLIN <=2 SENSITIVE Sensitive     CEFAZOLIN <=4 SENSITIVE Sensitive     CEFTRIAXONE <=1 SENSITIVE Sensitive     CIPROFLOXACIN <=0.25 SENSITIVE Sensitive     GENTAMICIN <=1 SENSITIVE Sensitive     IMIPENEM <=0.25 SENSITIVE Sensitive     NITROFURANTOIN <=16 SENSITIVE Sensitive     TRIMETH/SULFA <=20 SENSITIVE Sensitive     AMPICILLIN/SULBACTAM <=2 SENSITIVE Sensitive     PIP/TAZO <=4 SENSITIVE Sensitive     Extended ESBL NEGATIVE Sensitive     * 50,000 COLONIES/mL ESCHERICHIA COLI  Culture, respiratory (non-expectorated)     Status: None   Collection Time: 03/24/19 10:00 AM  Result Value Ref Range Status   Specimen Description   Final    TRACHEAL ASPIRATE Performed at [facility] [address] [place], [place] [address]    Special Requests   Final    Normal Performed at [facility], [place] [address] [place], [place] [address]    Gram Stain   Final    RARE WBC PRESENT,BOTH PMN AND MONONUCLEAR MODERATE GRAM POSITIVE COCCI FEW GRAM NEGATIVE RODS Performed at [facility], [place] [address] [place], [place] [address]    Culture MODERATE STAPHYLOCOCCUS AUREUS  Final   Report Status 03/27/2019 FINAL   Final   Organism ID, Bacteria STAPHYLOCOCCUS AUREUS  Final      Susceptibility   Staphylococcus aureus - MIC*    CIPROFLOXACIN <=0.5 SENSITIVE Sensitive     ERYTHROMYCIN <=0.25 SENSITIVE Sensitive     GENTAMICIN <=0.5 SENSITIVE Sensitive     OXACILLIN <=0.25 SENSITIVE Sensitive     TETRACYCLINE <=1 SENSITIVE Sensitive     VANCOMYCIN <=0.5 SENSITIVE Sensitive     TRIMETH/SULFA <=10 SENSITIVE Sensitive     CLINDAMYCIN <=0.25 SENSITIVE Sensitive     RIFAMPIN <=0.5 SENSITIVE Sensitive     Inducible Clindamycin NEGATIVE Sensitive     * MODERATE STAPHYLOCOCCUS AUREUS  Culture, blood (routine x 2)     Status: None   Collection Time: 03/24/19  10:39 AM  Result Value Ref Range Status   Specimen Description   Final    BLOOD RIGHT ARM Performed at [facility] [address], [place], [place] [address]    Special Requests   Final    BOTTLES DRAWN AEROBIC ONLY Blood Culture adequate volume   Culture   Final    NO GROWTH 5 DAYS Performed at [facility], [place] [address], [place], [place] [address]    Report Status 03/29/2019 FINAL  Final  Culture, blood (routine x 2)     Status: None   Collection Time: 03/24/19 10:48 AM  Result Value Ref Range Status   Specimen Description   Final    BLOOD RIGHT WRIST Performed at [facility] [address] [place], [place] [address]    Special Requests   Final    BOTTLES DRAWN AEROBIC ONLY Blood Culture adequate volume   Culture   Final    NO GROWTH 5 DAYS Performed at [facility], [place] [address] [place], [place] [address]    Report Status 03/29/2019 FINAL  Final  Respiratory Panel by PCR     Status: None   Collection Time: 03/28/19  3:22 PM  Result Value Ref Range Status   Adenovirus NOT DETECTED NOT DETECTED Final   Coronavirus 229E NOT DETECTED NOT DETECTED Final    Comment: (NOTE) The Coronavirus on the Respiratory Panel, DOES NOT test for the novel  Coronavirus (2019 nCoV)     Coronavirus HKU1 NOT DETECTED NOT DETECTED Final   Coronavirus NL63 NOT DETECTED NOT DETECTED Final   Coronavirus OC43 NOT DETECTED NOT DETECTED Final   Metapneumovirus NOT DETECTED NOT DETECTED Final   Rhinovirus / Enterovirus NOT DETECTED NOT DETECTED Final   Influenza A NOT DETECTED NOT DETECTED Final   Influenza B NOT DETECTED NOT DETECTED Final   Parainfluenza Virus 1 NOT DETECTED NOT DETECTED Final   Parainfluenza Virus 2 NOT DETECTED NOT DETECTED Final   Parainfluenza Virus 3 NOT DETECTED NOT DETECTED Final   Parainfluenza Virus 4 NOT DETECTED NOT DETECTED Final   Respiratory Syncytial Virus NOT DETECTED NOT DETECTED Final   Bordetella pertussis NOT DETECTED NOT DETECTED Final   Chlamydophila pneumoniae NOT DETECTED NOT DETECTED Final   Mycoplasma pneumoniae NOT DETECTED NOT DETECTED Final    Comment: Performed at [facility], [address] [place],  [address]   Time coordinating discharge: 35 minutes  SIGNED:  Kerney Elbe, DO [facility] 03/29/2019, 5:19 PM Pager is on [place]  If 7PM-7AM, please contact night-coverage [email or web address] Password [email or web address]

## 2019-03-29 NOTE — Progress Notes (Signed)
Subjective: No bleeding. Eating well. Ambulating room/halls.  Objective: Vital signs in last 24 hours: Temp:  [98.3 F (36.8 C)-98.9 F (37.2 C)] 98.9 F (37.2 C) (05/25 0350) Pulse Rate:  [77-100] 95 (05/25 0350) Resp:  [18-20] 18 (05/25 0350) BP: (139-143)/(85-94) 141/85 (05/25 0350) SpO2:  [97 %-98 %] 97 % (05/25 0350) Weight:  [107.5 kg] 107.5 kg (05/25 0350) Weight change:  Last BM Date: 03/29/19  PE: GEN:  Overweight, NAD ABD:  Protuberant  Lab Results: CBC    Component Value Date/Time   WBC 7.1 03/29/2019 0450   RBC 2.98 (L) 03/29/2019 0450   HGB 8.4 (L) 03/29/2019 0450   HGB 13.0 05/01/2017 0909   HCT 28.6 (L) 03/29/2019 0450   HCT 40.6 05/01/2017 0909   PLT 104 (L) 03/29/2019 0450   PLT 147 (L) 05/01/2017 0909   MCV 96.0 03/29/2019 0450   MCV 89 05/01/2017 0909   MCH 28.2 03/29/2019 0450   MCHC 29.4 (L) 03/29/2019 0450   RDW 16.5 (H) 03/29/2019 0450   RDW 14.4 05/01/2017 0909   LYMPHSABS 1.9 03/29/2019 0450   MONOABS 0.4 03/29/2019 0450   EOSABS 0.2 03/29/2019 0450   BASOSABS 0.0 03/29/2019 0450   CMP     Component Value Date/Time   NA 138 03/29/2019 0450   NA 139 05/01/2017 0909   K 4.1 03/29/2019 0450   CL 104 03/29/2019 0450   CO2 25 03/29/2019 0450   GLUCOSE 192 (H) 03/29/2019 0450   BUN 8 03/29/2019 0450   BUN 15 05/01/2017 0909   CREATININE 0.55 03/29/2019 0450   CREATININE 0.68 06/21/2016 1713   CALCIUM 8.6 (L) 03/29/2019 0450   PROT 7.1 03/29/2019 0450   PROT 7.9 05/01/2017 0909   ALBUMIN 3.0 (L) 03/29/2019 0450   ALBUMIN 4.4 05/01/2017 0909   AST 46 (H) 03/29/2019 0450   ALT 51 (H) 03/29/2019 0450   ALKPHOS 111 03/29/2019 0450   BILITOT 0.7 03/29/2019 0450   BILITOT 0.4 05/01/2017 0909   GFRNONAA >60 03/29/2019 0450   GFRNONAA >89 06/21/2016 1713   GFRAA >60 03/29/2019 0450   GFRAA >89 06/21/2016 1713   Assessment:  1. Decompensated cirrhosis (suspected NASH). 2. 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  No further active bleeding. 3. Acute blood loss anemia, likely from #2 above. Stable. 4. Obesity.  Plan:  1.  Pantoprazole 40 mg po qd, now and upon discharge. 2.  7-day total antibiotics (can convert to oral ciprofloxacin 500 mg po bid upon discharge, to complete 7-day total course of therapy). 3.  Soft mechanical diet. 4.  OK to discharge home from GI perspective; we will arrange outpatient follow-up with Dr. Bosie Clos. 5. [facility] will sign-off; please call with any further questions; thank you for the consultation.   Freddy Jaksch 03/29/2019, 12:32 PM   Cell [phone] If no answer or after 5 PM call [phone]

## 2019-03-29 NOTE — Progress Notes (Signed)
Patient is stable for discharge. Discharge instructions and medications have been reviewed with the patient and all questions answered. AVS and prescriptions given to patient. Patient given insulin starter kit and Living Well with Diabetes book per orders.   Danica Camarena, [place] 03/29/2019 5:23 PM

## 2019-03-29 NOTE — Progress Notes (Signed)
Occupational Therapy Treatment Patient Details Name: Angela Castillo MRN: [id number] DOB: September 21, 1976 Today's Date: 03/29/2019    History of present illness 43 year old female admitted for GIB.  s/p intubation.  PMH:  shoulder and back pain, headaches   OT comments  Plan is to DC home.  Pt overall mod I with simple ADL Activity. Plan is to go home this day.  Goals met- will sign off.  Follow Up Recommendations  Supervision/Assistance - 24 hour    Equipment Recommendations  3 in 1 bedside commode       Precautions / Restrictions Precautions Precautions: Fall       Mobility Bed Mobility Overal bed mobility: Modified Independent                Transfers Overall transfer level: Modified independent Equipment used: None                  Balance Overall balance assessment: Mild deficits observed, not formally tested                                         ADL either performed or assessed with clinical judgement   ADL Overall ADL's : Modified independent                                             Vision Patient Visual Report: No change from baseline            Cognition Arousal/Alertness: Awake/alert Behavior During Therapy: WFL for tasks assessed/performed Overall Cognitive Status: Within Functional Limits for tasks assessed                                                     Pertinent Vitals/ Pain       Pain Assessment: 0-10 Pain Score: 2  Pain Location: R hip Pain Descriptors / Indicators: Aching Pain Intervention(s): Repositioned         Frequency  Min 2X/week        Progress Toward Goals  OT Goals(current goals can now be found in the care plan section)  Progress towards OT goals: Progressing toward goals     Plan Discharge plan remains appropriate       AM-PAC OT "6 Clicks" Daily Activity     Outcome Measure   Help from another person eating meals?: None Help  from another person taking care of personal grooming?: None Help from another person toileting, which includes using toliet, bedpan, or urinal?: None Help from another person bathing (including washing, rinsing, drying)?: None Help from another person to put on and taking off regular upper body clothing?: None Help from another person to put on and taking off regular lower body clothing?: None 6 Click Score: 24    End of Session    OT Visit Diagnosis: Muscle weakness (generalized) (M62.81)   Activity Tolerance Patient tolerated treatment well   Patient Left in chair;with call bell/phone within reach;with chair alarm set   Nurse Communication Mobility status        Time: 1740-8144 OT Time Calculation (min): 11 min  Charges: OT General Charges $OT Visit:  1 Visit OT Treatments $Self Care/Home Management : 8-22 mins  Kari Baars, [place] [phone] Office- [phone], Thereasa Parkin 03/29/2019, 4:55 PM

## 2019-03-29 NOTE — Progress Notes (Signed)
Diabetic coordinator came in to educate patient about diabetes, she instructed patient in administering insulin.

## 2019-03-29 NOTE — TOC Initial Note (Signed)
Transition of Care [facility]) - Initial/Assessment Note    Patient Details  Name: Angela Castillo MRN: [id number] Date of Birth: 05-May-1976  Transition of Care [facility]) CM/SW Contact:    Geni Bers, RN Phone Number: 03/29/2019, 12:28 PM  Clinical Narrative:  Pt admitted with GI bleed.                Expected Discharge Plan: Home/Self Care Barriers to Discharge: No Barriers Identified   Patient Goals and CMS Choice Patient states their goals for this hospitalization and ongoing recovery are:: To get better      Expected Discharge Plan and Services Expected Discharge Plan: Home/Self Care In-house Referral: Clinical Social Work   Post Acute Care Choice: Home Health Living arrangements for the past 2 months: Single Family Home                                      Prior Living Arrangements/Services Living arrangements for the past 2 months: Single Family Home Lives with:: Spouse, Minor Children Patient language and need for interpreter reviewed:: No Do you feel safe going back to the place where you live?: No      Need for Family Participation in Patient Care: No (Comment) Care giver support system in place?: Yes (comment)   Criminal Activity/Legal Involvement Pertinent to Current Situation/Hospitalization: No - Comment as needed  Activities of Daily Living Home Assistive Devices/Equipment: Eyeglasses ADL Screening (condition at time of admission) Patient's cognitive ability adequate to safely complete daily activities?: Yes Is the patient deaf or have difficulty hearing?: No Does the patient have difficulty seeing, even when wearing glasses/contacts?: No Does the patient have difficulty concentrating, remembering, or making decisions?: No Patient able to express need for assistance with ADLs?: Yes Does the patient have difficulty dressing or bathing?: No Independently performs ADLs?: Yes (appropriate for developmental age) Does the patient have  difficulty walking or climbing stairs?: No Weakness of Legs: None Weakness of Arms/Hands: None  Permission Sought/Granted Permission sought to share information with : Case Manager Permission granted to share information with : Yes, Verbal Permission Granted  Share Information with NAME: Taura Wehrle           Emotional Assessment Appearance:: Appears stated age Attitude/Demeanor/Rapport: Engaged, Gracious Affect (typically observed): Accepting Orientation: : Oriented to Self, Oriented to Place, Oriented to  Time, Oriented to Situation   Psych Involvement: No (comment)  Admission diagnosis:  Gastrointestinal hemorrhage with hematemesis [K92.0] Patient Active Problem List   Diagnosis Date Noted  . Acute respiratory insufficiency   . Respiratory failure (HCC)   . Shock circulatory (HCC)   . GI bleed 03/21/2019  . Impingement syndrome of left shoulder 04/21/2017  . Low back pain 03/18/2016  . Left shoulder pain 03/18/2016  . Esophageal reflux 03/18/2016  . Lateral pain of left hip 10/25/2015  . Heel pain, bilateral 05/03/2015  . Obesity (BMI 30-39.9) 05/03/2015  . Hearing loss in right ear 03/01/2015  . Family history of diabetes mellitus in mother 03/01/2015  . Essential hypertension 01/18/2015  . Anemia, iron deficiency 01/04/2015  . Musculoskeletal chest pain 01/04/2015  . Transaminitis 01/03/2015   PCP:  Patient, No Pcp Per Pharmacy:   [id number] [place], [place] - [address] AT [facility] [address] [place] [address] Phone: [phone] Fax: [phone]  [facility] [place], [place] [address] [place] [address] Phone: [phone] Fax: [phone]  [facility] [address] - [place], [place] - [address] [place] [place] [address] Phone: [phone] Fax: [phone]  [facility] [address] [place] - [id number] [place] [id number] Phone: [phone] Fax: [phone]  [facility] [address], [place] - [address] [place] [address] Phone: [phone] Fax: [phone]     Social Determinants of Health (SDOH) Interventions    Readmission Risk Interventions No flowsheet data found.

## 2019-03-29 NOTE — Progress Notes (Signed)
Physical Therapy Treatment and Discharge from Acute PT Patient Details Name: Angela Castillo MRN: [id number] DOB: 04/02/1976 Today's Date: 03/29/2019    History of Present Illness 43 year old female admitted for GIB.  s/p intubation.  PMH:  shoulder and back pain, headaches    PT Comments    Pt reports steady progress with mobility and ambulating in hallway very well today.  Pt has met all goals and agreeable for d/c from acute PT.  Pt has no f/u needs at this time.  Acute PT to sign off.  Follow Up Recommendations  No PT follow up     Equipment Recommendations  None recommended by PT    Recommendations for Other Services       Precautions / Restrictions Precautions Precautions: Fall    Mobility  Bed Mobility Overal bed mobility: Modified Independent                Transfers Overall transfer level: Modified independent Equipment used: None                Ambulation/Gait Ambulation/Gait assistance: Supervision;Modified independent (Device/Increase time) Gait Distance (Feet): 450 Feet Assistive device: None Gait Pattern/deviations: Step-through pattern;Wide base of support     General Gait Details: no LOB, reports mild hip pain from arthritis but declines use of assistive device   Stairs             Wheelchair Mobility    Modified Rankin (Stroke Patients Only)       Balance                                            Cognition Arousal/Alertness: Awake/alert Behavior During Therapy: WFL for tasks assessed/performed Overall Cognitive Status: Within Functional Limits for tasks assessed                                        Exercises      General Comments        Pertinent Vitals/Pain Pain Assessment: 0-10 Pain Score: 3  Pain Location: R hip 2* arthritis (reports radiating down knee and into foot) Pain Descriptors / Indicators: Aching Pain Intervention(s): Monitored during  session;Repositioned    Home Living                      Prior Function            PT Goals (current goals can now be found in the care plan section) Progress towards PT goals: Goals met/education completed, patient discharged from PT    Frequency    Min 3X/week      PT Plan Current plan remains appropriate    Co-evaluation              AM-PAC PT "6 Clicks" Mobility   Outcome Measure  Help needed turning from your back to your side while in a flat bed without using bedrails?: None Help needed moving from lying on your back to sitting on the side of a flat bed without using bedrails?: None Help needed moving to and from a bed to a chair (including a wheelchair)?: None Help needed standing up from a chair using your arms (e.g., wheelchair or bedside chair)?: None Help needed to walk in hospital room?: None Help needed climbing  3-5 steps with a railing? : A Little 6 Click Score: 23    End of Session   Activity Tolerance: Patient tolerated treatment well Patient left: in bed;with call bell/phone within reach Nurse Communication: Mobility status PT Visit Diagnosis: Difficulty in walking, not elsewhere classified (R26.2)     Time: 8457-3344 PT Time Calculation (min) (ACUTE ONLY): 12 min  Charges:  $Gait Training: 8-22 mins                     Carmelia Bake, PT, DPT Acute Rehabilitation Services Office: [phone] Pager: [phone]  Trena Platt 03/29/2019, 1:57 PM

## 2019-03-30 ENCOUNTER — Telehealth: Payer: Self-pay

## 2019-03-30 NOTE — Progress Notes (Signed)
Pt discharged yesterday on a holiday. Opened chart to setup follow up appointment with [facility].

## 2019-03-30 NOTE — Progress Notes (Signed)
Pt called to give her appointment date 6/4 at 2:30 PM at [facility]. Pt was encouraged to keep appointment. Pt states she will.

## 2019-03-30 NOTE — Telephone Encounter (Addendum)
Message received from Geni Bers, RN CM requesting a hospital follow up appointment for the patient at [facility]. Informed her that an appointment has been scheduled for 04/08/2019 @ 1430. Myraette notified her of the appointment

## 2019-04-08 ENCOUNTER — Ambulatory Visit: Payer: BC Managed Care – PPO | Attending: Primary Care | Admitting: Primary Care

## 2019-04-08 ENCOUNTER — Encounter: Payer: Self-pay | Admitting: Primary Care

## 2019-04-08 ENCOUNTER — Other Ambulatory Visit: Payer: Self-pay

## 2019-04-08 VITALS — BP 128/83 | HR 72 | Temp 98.9°F | Ht 63.0 in | Wt 232.2 lb

## 2019-04-08 DIAGNOSIS — I10 Essential (primary) hypertension: Secondary | ICD-10-CM

## 2019-04-08 DIAGNOSIS — E1165 Type 2 diabetes mellitus with hyperglycemia: Secondary | ICD-10-CM | POA: Diagnosis not present

## 2019-04-08 MED ORDER — METFORMIN HCL 1000 MG PO TABS: 1000.0000 mg | ORAL_TABLET | Freq: Two times a day (BID) | ORAL | 3 refills | Status: DC

## 2019-04-08 NOTE — Progress Notes (Signed)
Established Care Patient Office Visit  Subjective:  Patient ID: Angela Castillo, female    DOB: Sep 11, 1976  Age: 43 y.o. MRN: [id number]  CC:  Chief Complaint  Patient presents with  . Hospitalization Follow-up    UGI    HPI Angela Castillo presents for management of diabetes.medical history significant of extensive history of hypertension, chronic shoulder pain, back pain, headaches, GERD, Fronek anemia of iron deficiency presenting with hematemesis  Past Medical History:  Diagnosis Date  . Anemia Dx January 03 2015  . Elevated liver enzymes   . Hypertension Dx Apr 2016  . Reflux     Past Surgical History:  Procedure Laterality Date  . ESOPHAGEAL BANDING  03/22/2019   Procedure: ESOPHAGEAL BANDING;  Surgeon: Wilford Corner, MD;  Location: [facility];  Service: Endoscopy;;  . ESOPHAGEAL BANDING  03/23/2019   Procedure: ESOPHAGEAL BANDING;  Surgeon: Wilford Corner, MD;  Location: [facility];  Service: Endoscopy;;  . ESOPHAGOGASTRODUODENOSCOPY N/A 03/22/2019   Procedure: ESOPHAGOGASTRODUODENOSCOPY (EGD);  Surgeon: Wilford Corner, MD;  Location: [facility];  Service: Endoscopy;  Laterality: N/A;  . ESOPHAGOGASTRODUODENOSCOPY N/A 03/23/2019   Procedure: ESOPHAGOGASTRODUODENOSCOPY (EGD);  Surgeon: Wilford Corner, MD;  Location: [facility];  Service: Endoscopy;  Laterality: N/A;  . TUBAL LIGATION  Jul 25, 2000    Family History  Problem Relation Age of Onset  . COPD Mother   . Esophageal cancer Mother   . Diabetes Mother   . Cancer Mother   . Hypertension Mother   . Diabetes Father     Social History   Socioeconomic History  . Marital status: Married    Spouse name: Not on file  . Number of children: Not on file  . Years of education: Not on file  . Highest education level: Not on file  Occupational History  . Not on file  Social Needs  . Financial resource strain: Not on file  . Food insecurity    Worry: Not on file    Inability: Not on  file  . Transportation needs    Medical: Not on file    Non-medical: Not on file  Tobacco Use  . Smoking status: Never Smoker  . Smokeless tobacco: Never Used  Substance and Sexual Activity  . Alcohol use: Yes    Comment: rarely  . Drug use: No  . Sexual activity: Not on file  Lifestyle  . Physical activity    Days per week: Not on file    Minutes per session: Not on file  . Stress: Not on file  Relationships  . Social Herbalist on phone: Not on file    Gets together: Not on file    Attends religious service: Not on file    Active member of club or organization: Not on file    Attends meetings of clubs or organizations: Not on file    Relationship status: Not on file  . Intimate partner violence    Fear of current or ex partner: Not on file    Emotionally abused: Not on file    Physically abused: Not on file    Forced sexual activity: Not on file  Other Topics Concern  . Not on file  Social History Narrative  . Not on file    Outpatient Medications Prior to Visit  Medication Sig Dispense Refill  . benzonatate (TESSALON) 200 MG capsule Take 1 capsule (200 mg total) by mouth 3 (three) times daily as needed for cough. 21 capsule 0  .  blood glucose meter kit and supplies KIT Dispense based on patient and insurance preference. Use up to four times daily as directed. (FOR ICD-9 250.00, 250.01). Dispense Relion 1 each 0  . chlorpheniramine-HYDROcodone (TUSSIONEX) 10-8 MG/5ML SUER Take 5 mLs by mouth every 12 (twelve) hours as needed for cough. 70 mL 0  . CONTOUR NEXT TEST test strip USE 1 STRIP TO CHECK GLUCOSE UP TO 4 TIMES DAILY AS DIRECTED    . ferrous sulfate 325 (65 FE) MG tablet Take 1 tablet (325 mg total) by mouth 2 (two) times daily with a meal. 60 tablet 0  . Insulin Detemir (LEVEMIR FLEXTOUCH) 100 UNIT/ML Pen Inject 6 Units into the skin daily. 15 mL 0  . Insulin Pen Needle 31G X 5 MM MISC 1 Container by Does not apply route 2 (two) times a day. 100 each 0   . losartan (COZAAR) 100 MG tablet Take 1 tablet (100 mg total) by mouth daily. 30 tablet 0  . pantoprazole (PROTONIX) 40 MG tablet Take 1 tablet (40 mg total) by mouth daily. 30 tablet 0  . propranolol (INDERAL) 20 MG tablet Take 1 tablet (20 mg total) by mouth 2 (two) times daily. 60 tablet 0  . guaiFENesin (MUCINEX) 600 MG 12 hr tablet Take 1 tablet (600 mg total) by mouth 2 (two) times daily. (Patient not taking: Reported on 04/08/2019) 10 tablet 0  . hydrocortisone (ANUSOL-HC) 2.5 % rectal cream Place rectally 3 (three) times daily as needed for hemorrhoids or anal itching. (Patient not taking: Reported on 04/08/2019) 30 g 0  . Microlet Lancets MISC USE 1 TO CHECK GLUCOSE UP TO 4 TIMES DAILY AS DIRECTED    . ondansetron (ZOFRAN) 4 MG tablet Take 1 tablet (4 mg total) by mouth every 6 (six) hours as needed for nausea. (Patient not taking: Reported on 04/08/2019) 20 tablet 0  . senna-docusate (SENOKOT-S) 8.6-50 MG tablet Take 1 tablet by mouth at bedtime as needed for mild constipation. (Patient not taking: Reported on 04/08/2019) 30 tablet 0   No facility-administered medications prior to visit.     No Known Allergies  ROS Review of Systems  Constitutional: Negative.   HENT: Positive for congestion.   Eyes: Negative.   Respiratory: Negative.   Cardiovascular: Negative.   Gastrointestinal: Negative.   Endocrine: Positive for polydipsia, polyphagia and polyuria.  Genitourinary: Negative.   Musculoskeletal: Negative.   Skin: Negative.   Allergic/Immunologic: Negative.   Neurological: Negative.   Hematological: Negative.   Psychiatric/Behavioral: Negative.       Objective:    Physical Exam  Constitutional: She appears well-developed and well-nourished.  Neck: Neck supple.  Cardiovascular: Normal rate and regular rhythm.  Pulmonary/Chest: Breath sounds normal.  Musculoskeletal: Normal range of motion.  Skin: Skin is warm.  Psychiatric: She has a normal mood and affect.    BP  128/83 (BP Location: Left Arm, Patient Position: Sitting, Cuff Size: Normal)   Pulse 72   Temp 98.9 F (37.2 C) (Oral)   Ht 5' 3"  (1.6 m)   Wt 232 lb 3.2 oz (105.3 kg)   LMP 03/26/2019 (Exact Date)   SpO2 96%   BMI 41.13 kg/m  Wt Readings from Last 3 Encounters:  04/08/19 232 lb 3.2 oz (105.3 kg)  03/29/19 236 lb 14.4 oz (107.5 kg)  07/13/18 249 lb (112.9 kg)     Health Maintenance Due  Topic Date Due  . PAP SMEAR-Modifier  02/28/2018    There are no preventive care reminders to display for  this patient.  Lab Results  Component Value Date   TSH 0.806 03/21/2019   Lab Results  Component Value Date   WBC 7.1 03/29/2019   HGB 8.4 (L) 03/29/2019   HCT 28.6 (L) 03/29/2019   MCV 96.0 03/29/2019   PLT 104 (L) 03/29/2019   Lab Results  Component Value Date   NA 138 03/29/2019   K 4.1 03/29/2019   CO2 25 03/29/2019   GLUCOSE 192 (H) 03/29/2019   BUN 8 03/29/2019   CREATININE 0.55 03/29/2019   BILITOT 0.7 03/29/2019   ALKPHOS 111 03/29/2019   AST 46 (H) 03/29/2019   ALT 51 (H) 03/29/2019   PROT 7.1 03/29/2019   ALBUMIN 3.0 (L) 03/29/2019   CALCIUM 8.6 (L) 03/29/2019   ANIONGAP 9 03/29/2019   Lab Results  Component Value Date   CHOL 192 06/12/2015   Lab Results  Component Value Date   HDL 24 (L) 06/12/2015   Lab Results  Component Value Date   LDLCALC 140 (H) 06/12/2015   Lab Results  Component Value Date   TRIG 268 (H) 03/22/2019   Lab Results  Component Value Date   CHOLHDL 8.0 (H) 06/12/2015   Lab Results  Component Value Date   HGBA1C 9.5 (H) 03/21/2019      Assessment & Plan:   Problem List Items Addressed This Visit    Essential hypertension - Primary (Chronic)    Toria was seen today for hospitalization follow-up.  Diagnoses and all orders for this visit:  Essential hypertension Bp is unremarkable on an ARB cont and provides renal protection.  Diabetes Mellitus  Uncontrolled with A1C 9.5 cont insulin added metformin 1018m bid  in combination with insulin. Discussed diet and exercise.  Will ask Clinical Pharmacist to follow up with teaching and assess with adherence.    Other orders -     metFORMIN (GLUCOPHAGE) 1000 MG tablet; Take 1 tablet (1,000 mg total) by mouth 2 (two) times daily with a meal.    Meds ordered this encounter  Medications  . metFORMIN (GLUCOPHAGE) 1000 MG tablet    Sig: Take 1 tablet (1,000 mg total) by mouth 2 (two) times daily with a meal.    Dispense:  60 tablet    Refill:  3    Follow-up: Return in about 3 months (around 07/09/2019) for T2D .    MKerin Perna NP

## 2019-04-09 ENCOUNTER — Other Ambulatory Visit: Payer: Self-pay | Admitting: General Practice

## 2019-04-09 NOTE — Telephone Encounter (Signed)
1) Medication(s) Requested (by name): Pantoprazole losartan 2) Pharmacy of Choice: [place], [place].

## 2019-04-09 NOTE — Telephone Encounter (Signed)
Please refill the request if appropriate! Was just in office

## 2019-04-11 ENCOUNTER — Encounter: Payer: Self-pay | Admitting: Primary Care

## 2019-04-12 ENCOUNTER — Telehealth: Payer: Self-pay | Admitting: Primary Care

## 2019-04-12 ENCOUNTER — Other Ambulatory Visit: Payer: Self-pay | Admitting: Primary Care

## 2019-04-12 MED ORDER — LOSARTAN POTASSIUM 100 MG PO TABS: 100.0000 mg | ORAL_TABLET | Freq: Every day | ORAL | 3 refills | Status: DC

## 2019-04-12 MED ORDER — PANTOPRAZOLE SODIUM 40 MG PO TBEC: 40.0000 mg | DELAYED_RELEASE_TABLET | Freq: Every day | ORAL | 1 refills | Status: DC

## 2019-04-12 MED ORDER — PROPRANOLOL HCL 20 MG PO TABS: 20.0000 mg | ORAL_TABLET | Freq: Two times a day (BID) | ORAL | 3 refills | Status: DC

## 2019-04-14 NOTE — Telephone Encounter (Signed)
Ersie this message is blank.

## 2019-04-19 ENCOUNTER — Encounter: Payer: Self-pay | Admitting: Primary Care

## 2019-04-19 NOTE — Patient Instructions (Signed)
Diabetes Mellitus and Nutrition, Adult  When you have diabetes (diabetes mellitus), it is very important to have healthy eating habits because your blood sugar (glucose) levels are greatly affected by what you eat and drink. Eating healthy foods in the appropriate amounts, at about the same times every day, can help you:  · Control your blood glucose.  · Lower your risk of heart disease.  · Improve your blood pressure.  · Reach or maintain a healthy weight.  Every person with diabetes is different, and each person has different needs for a meal plan. Your health care provider may recommend that you work with a diet and nutrition specialist (dietitian) to make a meal plan that is best for you. Your meal plan may vary depending on factors such as:  · The calories you need.  · The medicines you take.  · Your weight.  · Your blood glucose, blood pressure, and cholesterol levels.  · Your activity level.  · Other health conditions you have, such as heart or kidney disease.  How do carbohydrates affect me?  Carbohydrates, also called carbs, affect your blood glucose level more than any other type of food. Eating carbs naturally raises the amount of glucose in your blood. Carb counting is a method for keeping track of how many carbs you eat. Counting carbs is important to keep your blood glucose at a healthy level, especially if you use insulin or take certain oral diabetes medicines.  It is important to know how many carbs you can safely have in each meal. This is different for every person. Your dietitian can help you calculate how many carbs you should have at each meal and for each snack.  Foods that contain carbs include:  · Bread, cereal, rice, pasta, and crackers.  · Potatoes and corn.  · Peas, beans, and lentils.  · Milk and yogurt.  · Fruit and juice.  · Desserts, such as cakes, cookies, ice cream, and candy.  How does alcohol affect me?  Alcohol can cause a sudden decrease in blood glucose (hypoglycemia),  especially if you use insulin or take certain oral diabetes medicines. Hypoglycemia can be a life-threatening condition. Symptoms of hypoglycemia (sleepiness, dizziness, and confusion) are similar to symptoms of having too much alcohol.  If your health care provider says that alcohol is safe for you, follow these guidelines:  · Limit alcohol intake to no more than 1 drink per day for nonpregnant women and 2 drinks per day for men. One drink equals 12 oz of beer, 5 oz of wine, or 1½ oz of hard liquor.  · Do not drink on an empty stomach.  · Keep yourself hydrated with water, diet soda, or unsweetened iced tea.  · Keep in mind that regular soda, juice, and other mixers may contain a lot of sugar and must be counted as carbs.  What are tips for following this plan?    Reading food labels  · Start by checking the serving size on the "Nutrition Facts" label of packaged foods and drinks. The amount of calories, carbs, fats, and other nutrients listed on the label is based on one serving of the item. Many items contain more than one serving per package.  · Check the total grams (g) of carbs in one serving. You can calculate the number of servings of carbs in one serving by dividing the total carbs by 15. For example, if a food has 30 g of total carbs, it would be equal to 2   servings of carbs.  · Check the number of grams (g) of saturated and trans fats in one serving. Choose foods that have low or no amount of these fats.  · Check the number of milligrams (mg) of salt (sodium) in one serving. Most people should limit total sodium intake to less than 2,300 mg per day.  · Always check the nutrition information of foods labeled as "low-fat" or "nonfat". These foods may be higher in added sugar or refined carbs and should be avoided.  · Talk to your dietitian to identify your daily goals for nutrients listed on the label.  Shopping  · Avoid buying canned, premade, or processed foods. These foods tend to be high in fat, sodium,  and added sugar.  · Shop around the outside edge of the grocery store. This includes fresh fruits and vegetables, bulk grains, fresh meats, and fresh dairy.  Cooking  · Use low-heat cooking methods, such as baking, instead of high-heat cooking methods like deep frying.  · Cook using healthy oils, such as olive, canola, or sunflower oil.  · Avoid cooking with butter, cream, or high-fat meats.  Meal planning  · Eat meals and snacks regularly, preferably at the same times every day. Avoid going long periods of time without eating.  · Eat foods high in fiber, such as fresh fruits, vegetables, beans, and whole grains. Talk to your dietitian about how many servings of carbs you can eat at each meal.  · Eat 4-6 ounces (oz) of lean protein each day, such as lean meat, chicken, fish, eggs, or tofu. One oz of lean protein is equal to:  ? 1 oz of meat, chicken, or fish.  ? 1 egg.  ? ¼ cup of tofu.  · Eat some foods each day that contain healthy fats, such as avocado, nuts, seeds, and fish.  Lifestyle  · Check your blood glucose regularly.  · Exercise regularly as told by your health care provider. This may include:  ? 150 minutes of moderate-intensity or vigorous-intensity exercise each week. This could be brisk walking, biking, or water aerobics.  ? Stretching and doing strength exercises, such as yoga or weightlifting, at least 2 times a week.  · Take medicines as told by your health care provider.  · Do not use any products that contain nicotine or tobacco, such as cigarettes and e-cigarettes. If you need help quitting, ask your health care provider.  · Work with a counselor or diabetes educator to identify strategies to manage stress and any emotional and social challenges.  Questions to ask a health care provider  · Do I need to meet with a diabetes educator?  · Do I need to meet with a dietitian?  · What number can I call if I have questions?  · When are the best times to check my blood glucose?  Where to find more  information:  · American Diabetes Association: diabetes.org  · Academy of Nutrition and Dietetics: www.eatright.org  · National Institute of Diabetes and Digestive and Kidney Diseases (NIH): www.niddk.nih.gov  Summary  · A healthy meal plan will help you control your blood glucose and maintain a healthy lifestyle.  · Working with a diet and nutrition specialist (dietitian) can help you make a meal plan that is best for you.  · Keep in mind that carbohydrates (carbs) and alcohol have immediate effects on your blood glucose levels. It is important to count carbs and to use alcohol carefully.  This information is not intended to   replace advice given to you by your health care provider. Make sure you discuss any questions you have with your health care provider.  Document Released: 07/18/2005 Document Revised: 05/21/2017 Document Reviewed: 11/25/2016  Elsevier Interactive Patient Education © 2019 [employer]vier Inc.

## 2019-04-20 ENCOUNTER — Other Ambulatory Visit: Payer: Self-pay | Admitting: Pharmacist

## 2019-04-20 ENCOUNTER — Encounter: Payer: Self-pay | Admitting: Primary Care

## 2019-04-20 DIAGNOSIS — E119 Type 2 diabetes mellitus without complications: Secondary | ICD-10-CM | POA: Insufficient documentation

## 2019-04-20 MED ORDER — CONTOUR NEXT TEST VI STRP: ORAL_STRIP | 11 refills | Status: DC

## 2019-04-20 MED ORDER — MICROLET LANCETS MISC: 11 refills | Status: DC

## 2019-04-20 NOTE — Assessment & Plan Note (Signed)
Refill on oral agent cont on insulin . Dicussed low carb diet and excercise

## 2019-04-26 ENCOUNTER — Other Ambulatory Visit: Payer: Self-pay

## 2019-04-26 ENCOUNTER — Ambulatory Visit: Payer: BC Managed Care – PPO | Attending: Family Medicine | Admitting: Pharmacist

## 2019-04-26 DIAGNOSIS — E1165 Type 2 diabetes mellitus with hyperglycemia: Secondary | ICD-10-CM | POA: Diagnosis not present

## 2019-04-26 DIAGNOSIS — IMO0002 Reserved for concepts with insufficient information to code with codable children: Secondary | ICD-10-CM

## 2019-04-26 NOTE — Progress Notes (Signed)
    S:    PCP: Sharyn Lull  No chief complaint on file.  Patient arrives in good spirits.  Presents for diabetes evaluation, education, and management at the request of Cathi. Patient was referred on 04/08/19 - Arianni started metformin.    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  Current diabetes medications include: metformin 1000 mg BID; Levemir 6 units  Current hypertension medications include: losartan 100 mg, propranolol 20 mg BID Current hyperlipidemia medications include: none currently  Patient denies hypoglycemic events.  Patient reported dietary habits:  - Endorses diet heavy in rice and mac and cheese.  - Denies sugary foods and drinks  Patient-reported exercise habits: none outside of work   Patient denies nocturia.  Patient denies neuropathy. Patient denies visual changes. Patient denies self foot exams.     O:  POCT glucose: 106  Home CBG:  - Gives daytime range of 82-130 (included are post-prandial and random levels) - One outlier of 162 after cake  Lab Results  Component Value Date   HGBA1C 9.5 (H) 03/21/2019   There were no vitals filed for this visit.  Lipid Panel     Component Value Date/Time   CHOL 192 06/12/2015 1756   TRIG 268 (H) 03/22/2019 1439   HDL 24 (L) 06/12/2015 1756   CHOLHDL 8.0 (H) 06/12/2015 1756   VLDL 28 06/12/2015 1756   LDLCALC 140 (H) 06/12/2015 1756   Clinical ASCVD: No  The ASCVD Risk score Mikey Bussing DC Jr., et al., 2013) failed to calculate for the following reasons:   Cannot find a previous HDL lab   Cannot find a previous total cholesterol lab   A/P: Diabetes newly diagnosed currently uncontrolled based on A1c; A1c goal <7%. Patient is able to verbalize appropriate hypoglycemia management plan. Patient is adherent with medication.   Patient's blood sugar is  well-controlled and she is on a very low dose of insulin (6 units of Levemir daily). I think her control is mostly d/t metformin; pt is amenable to hold insulin to assess control with metformin alone. I will meet with her in 3 weeks to reassess. Of note, she has a hx of suspected cirrhosis secondary to [facility] (03/22/2018). Counseling provided concerning hepatic impairment and increase in lactic acidosis risk with metformin. We will continue to monitor this. Patient knows to stay well-hydrated and report any symptoms.   -Hold Levemir. Continue metformin only for now. -Extensively discussed pathophysiology of DM, recommended lifestyle interventions, dietary effects on glycemic control -Counseled on s/sx of and management of hypoglycemia -Next A1C anticipated August 2020.   ASCVD risk - primary prevention in patient with DM. Needs lipid panel. Will defer to next appt. Pt with hx of small elevation in AST/ALT.   HM:  - Pneumovax due; defer to next appointment.   Written patient instructions provided. Total time in face to face counseling 30 minutes.   Follow up Pharmacist Clinic Visit in 3 weeks.     Patient seen with: Jonathon Bellows PharmD Candidate [phone] [place], PharmD, [place] [phone]

## 2019-04-26 NOTE — Patient Instructions (Signed)
Thank you for coming to see me today. Please do the following:  1. Hold Levemir. 2. Continue metformin.   3. Continue checking blood sugars at home.   4. Continue making the lifestyle changes we've discussed together during our visit. Diet and exercise play a significant role in improving your blood sugars.  5. Follow-up with me in 2 weeks.    Hypoglycemia or low blood sugar:   Low blood sugar can happen quickly and may become an emergency if not treated right away.   While this shouldn't happen often, it can be brought upon if you skip a meal or do not eat enough. Also, if your insulin or other diabetes medications are dosed too high, this can cause your blood sugar to go to low.   Warning signs of low blood sugar include: 1. Feeling shaky or dizzy 2. Feeling weak or tired  3. Excessive hunger 4. Feeling anxious or upset  5. Sweating even when you aren't exercising  What to do if I experience low blood sugar? 1. Check your blood sugar with your meter. If lower than 70, proceed to step 2.  2. Treat with 3-4 glucose tablets or 3 packets of regular sugar. If these aren't around, you can try hard candy. Yet another option would be to drink 4 ounces of fruit juice or 6 ounces of REGULAR soda.  3. Re-check your sugar in 15 minutes. If it is still below 70, do what you did in step 2 again. If has come back up, go ahead and eat a snack or small meal at this time.

## 2019-04-27 ENCOUNTER — Encounter: Payer: Self-pay | Admitting: Pharmacist

## 2019-04-29 ENCOUNTER — Encounter: Payer: Self-pay | Admitting: Registered"

## 2019-04-29 ENCOUNTER — Encounter: Payer: BC Managed Care – PPO | Attending: Primary Care | Admitting: Registered"

## 2019-04-29 ENCOUNTER — Other Ambulatory Visit: Payer: Self-pay

## 2019-04-29 DIAGNOSIS — E1169 Type 2 diabetes mellitus with other specified complication: Secondary | ICD-10-CM | POA: Insufficient documentation

## 2019-04-29 DIAGNOSIS — E669 Obesity, unspecified: Secondary | ICD-10-CM | POA: Diagnosis not present

## 2019-04-29 NOTE — Patient Instructions (Addendum)
Instructions/Goals:  -Continue working to eat every 3-5 hours while awake. Recommend not eating closer than 2-3 hours before bed.   -Recommend 2-3 carbohydrates choices per meal (30-45 g). If a food has a label want to refer to label for carbohydrate amount.   -Continue checking blood sugar as recommended by your doctor. Recommend checking postprandially once daily as well to see how blood sugar is affected by your meals.   Make physical activity a part of your week. Try to include at least 30 minutes of physical activity 5 days each week or at least 150 minutes per week. Regular physical activity promotes overall health-including helping to reduce risk for heart disease and diabetes, promoting mental health, and helping [place] sleep better.  -Goal: Add 10 minutes of walking to your days and gradually work to 30 minutes most days.

## 2019-04-29 NOTE — Progress Notes (Signed)
Diabetes Self-Management Education  Visit Type: First/Initial  Appt. Start Time: 1530 Appt. End Time: 1710  05/03/2019  [name], identified by name and date of birth, is a 43 y.o. female with a diagnosis of Diabetes: Type 2.   ASSESSMENT  Weight 232 lb 3.2 oz (105.3 kg). Body mass index is 41.13 kg/m.   Pt present for appointment alone. Pt was dx with T2DM this past May 2020. Pt was hospitalized May 2020 d/t GI bleed. Last hgbA1c was 9.5. Pt also has PMHx of HTN, anemia, and GERD. Pt reports the highest her blood sugar has been since being dx with diabetes was after eating a piece of cake and it went up to around 162. Reports that is the highest she is aware of it going over past month. Pt was prescribed low dose of insulin, but her doctor has recommended she pause insulin for now to see how pt does with Metformin alone. Pt reports blood sugars have been between around 80 to 162 (postprandial) being the highest over the past month since pt started SMBG. Reports she checks fasting in the morning and checks before meals, does not typically check after meals. Pt reports she eats a snack sometimes early morning before work, breakfast around 10 AM, lunch (protein shake) 1PM, dinner between 4-6 PM. 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 Pt reports husband is a good supporter. Reports he had weight loss surgery in the past and tries to eat healthy himself.     Diabetes Self-Management Education - 04/29/19 1543      Visit Information   Visit Type  First/Initial      Initial Visit   Diabetes Type  Type 2    Are you currently following a meal plan?  No    Are you taking your medications as prescribed?  Yes    Date Diagnosed  03/2019      Health Coping   How would you rate your overall health?  Good      Psychosocial Assessment   Patient Belief/Attitude about Diabetes  Motivated to manage diabetes    Self-care barriers  Unable to determine    Self-management support   Family    Other persons present  Patient    Patient Concerns  Nutrition/Meal planning    Special Needs  Unable to determine    Preferred Learning Style  No preference indicated    Learning Readiness  Ready    How often do you need to have someone help you when you read instructions, pamphlets, or other written materials from your doctor or pharmacy?  2 - Rarely    What is the last grade level you completed in school?  12th grade      Pre-Education Assessment   Patient understands the diabetes disease and treatment process.  Needs Instruction    Patient understands incorporating nutritional management into lifestyle.  Needs Instruction    Patient undertands incorporating physical activity into lifestyle.  Needs Instruction    Patient understands using medications safely.  Demonstrates understanding / competency    Patient understands monitoring blood glucose, interpreting and using results  Needs Instruction    Patient understands prevention, detection, and treatment of acute complications.  Needs Instruction    Patient understands prevention, detection, and treatment of chronic complications.  Needs Instruction    Patient understands how to develop strategies to address psychosocial issues.  Needs Instruction    Patient understands how to develop strategies to promote health/change behavior.  Needs Instruction      Complications   Last HgB A1C per patient/outside source  9.5 %    How often do you check your blood sugar?  3-4 times/day    Fasting Blood glucose range (mg/dL)  [phone]    Postprandial Blood glucose range (mg/dL)  [phone]    Number of hypoglycemic episodes per month  0    Number of hyperglycemic episodes per week  0    Have you had a dilated eye exam in the past 12 months?  Yes    Have you had a dental exam in the past 12 months?  No    Are you checking your feet?  Yes    How many days per week are you checking your feet?  4      Dietary Intake   Breakfast  345 AM small  bowl of Wheat Chex cereal, 1% milk    Snack (morning)  8 AM Equate protein shake    Lunch  08-1029 AM: macaroni and cheese, peas and carrots, sugar free pudding, water    Snack (afternoon)  1:15 PM: Equate protein shake    Dinner  330-430 PM salad with arugula, spinach, mushrooms, cheese, Skinny Girl dressing, water    Snack (evening)  630-7 PM sugar free pudding    Beverage(s)  water; 1% milk      Exercise   Exercise Type  ADL's;Light (walking / raking leaves)    How many days per week to you exercise?  0    How many minutes per day do you exercise?  0    Total minutes per week of exercise  0      Patient Education   Previous Diabetes Education  No    Disease state   Definition of diabetes, type 1 and 2, and the diagnosis of diabetes;Factors that contribute to the development of diabetes    Nutrition management   Role of diet in the treatment of diabetes and the relationship between the three main macronutrients and blood glucose level;Food label reading, portion sizes and measuring food.;Carbohydrate counting;Reviewed blood glucose goals for pre and post meals and how to evaluate the patients' food intake on their blood glucose level.    Physical activity and exercise   Role of exercise on diabetes management, blood pressure control and cardiac health.    Medications  Reviewed patients medication for diabetes, action, purpose, timing of dose and side effects.    Monitoring  Purpose and frequency of SMBG.;Taught/discussed recording of test results and interpretation of SMBG.;Interpreting lab values - A1C, lipid, urine microalbumina.;Identified appropriate SMBG and/or A1C goals.;Daily foot exams;Yearly dilated eye exam    Acute complications  Discussed and identified patients' treatment of hyperglycemia.;Taught treatment of hypoglycemia - the 15 rule.    Chronic complications  Relationship between chronic complications and blood glucose control;Assessed and discussed foot care and prevention of  foot problems;Lipid levels, blood glucose control and heart disease;Dental care;Retinopathy and reason for yearly dilated eye exams;Nephropathy, what it is, prevention of, the use of ACE, ARB's and early detection of through urine microalbumia.;Reviewed with patient heart disease, higher risk of, and prevention    Personal strategies to promote health  Lifestyle issues that need to be addressed for better diabetes care      Individualized Goals (developed by patient)   Nutrition  Follow meal plan discussed;General guidelines for healthy choices and portions discussed    Physical Activity  Exercise 3-5 times per week    Medications  take  my medication as prescribed    Monitoring   test my blood glucose as discussed      Post-Education Assessment   Patient understands the diabetes disease and treatment process.  Demonstrates understanding / competency    Patient understands incorporating nutritional management into lifestyle.  Demonstrates understanding / competency    Patient undertands incorporating physical activity into lifestyle.  Demonstrates understanding / competency    Patient understands using medications safely.  Demonstrates understanding / competency    Patient understands monitoring blood glucose, interpreting and using results  Demonstrates understanding / competency    Patient understands prevention, detection, and treatment of acute complications.  Demonstrates understanding / competency    Patient understands prevention, detection, and treatment of chronic complications.  Demonstrates understanding / competency    Patient understands how to develop strategies to address psychosocial issues.  Demonstrates understanding / competency    Patient understands how to develop strategies to promote health/change behavior.  Demonstrates understanding / competency      Outcomes   Expected Outcomes  Demonstrated interest in learning. Expect positive outcomes    Future DMSE  4-6 wks     Program Status  Completed       Individualized Plan for Diabetes Self-Management Training:   Learning Objective:  Patient will have a greater understanding of diabetes self-management. Patient education plan is to attend individual and/or group sessions per assessed needs and concerns.   Plan:   Patient Instructions  Instructions/Goals:  -Continue working to eat every 3-5 hours while awake. Recommend not eating closer than 2-3 hours before bed.   -Recommend 2-3 carbohydrates choices per meal (30-45 g). If a food has a label want to refer to label for carbohydrate amount.   -Continue checking blood sugar as recommended by your doctor. Recommend checking postprandially once daily as well to see how blood sugar is affected by your meals.   Make physical activity a part of your week. Try to include at least 30 minutes of physical activity 5 days each week or at least 150 minutes per week. Regular physical activity promotes overall health-including helping to reduce risk for heart disease and diabetes, promoting mental health, and helping [place] sleep better.  -Goal: Add 10 minutes of walking to your days and gradually work to 30 minutes most days.      Expected Outcomes:  Demonstrated interest in learning. Expect positive outcomes  Education material provided: ADA - How to Thrive: A Guide for Your Journey with Diabetes, Meal plan card, My Plate and Snack sheet, portion size fold out  If problems or questions, patient to contact team via:  Phone and Email  Future DSME appointment: 4-6 wks

## 2019-05-05 ENCOUNTER — Encounter (INDEPENDENT_AMBULATORY_CARE_PROVIDER_SITE_OTHER): Payer: Self-pay | Admitting: Primary Care

## 2019-05-09 ENCOUNTER — Encounter (INDEPENDENT_AMBULATORY_CARE_PROVIDER_SITE_OTHER): Payer: Self-pay | Admitting: Primary Care

## 2019-05-10 DIAGNOSIS — K7460 Unspecified cirrhosis of liver: Secondary | ICD-10-CM | POA: Diagnosis not present

## 2019-05-17 ENCOUNTER — Other Ambulatory Visit: Payer: Self-pay

## 2019-05-17 ENCOUNTER — Ambulatory Visit: Payer: BC Managed Care – PPO | Attending: Primary Care | Admitting: Pharmacist

## 2019-05-17 ENCOUNTER — Encounter: Payer: Self-pay | Admitting: Pharmacist

## 2019-05-17 DIAGNOSIS — E1165 Type 2 diabetes mellitus with hyperglycemia: Secondary | ICD-10-CM

## 2019-05-17 DIAGNOSIS — K7460 Unspecified cirrhosis of liver: Secondary | ICD-10-CM | POA: Diagnosis not present

## 2019-05-17 DIAGNOSIS — IMO0002 Reserved for concepts with insufficient information to code with codable children: Secondary | ICD-10-CM

## 2019-05-17 DIAGNOSIS — Z23 Encounter for immunization: Secondary | ICD-10-CM | POA: Diagnosis not present

## 2019-05-17 LAB — GLUCOSE, POCT (MANUAL RESULT ENTRY): POC Glucose: 84 mg/dl (ref 70–99)

## 2019-05-17 MED ORDER — PNEUMOVAX 23 25 MCG/0.5ML IJ INJ: 0.5000 mL | INJECTION | Freq: Once | INTRAMUSCULAR | 0 refills | Status: DC

## 2019-05-17 NOTE — Patient Instructions (Addendum)
Thank you for coming to see me today. Please do the following:  1. Continue metformin. 2. Continue checking blood sugars at home.  3. Continue making the lifestyle changes we've discussed together during our visit. Diet and exercise play a significant role in improving your blood sugars.  4. Follow-up with Sharyn Lull in August.   Hypoglycemia or low blood sugar:   Low blood sugar can happen quickly and may become an emergency if not treated right away.   While this shouldn't happen often, it can be brought upon if you skip a meal or do not eat enough. Also, if your insulin or other diabetes medications are dosed too high, this can cause your blood sugar to go to low.   Warning signs of low blood sugar include: 1. Feeling shaky or dizzy 2. Feeling weak or tired  3. Excessive hunger 4. Feeling anxious or upset  5. Sweating even when you aren't exercising  What to do if I experience low blood sugar? 1. Check your blood sugar with your meter. If lower than 70, proceed to step 2.  2. Treat with 3-4 glucose tablets or 3 packets of regular sugar. If these aren't around, you can try hard candy. Yet another option would be to drink 4 ounces of fruit juice or 6 ounces of REGULAR soda.  3. Re-check your sugar in 15 minutes. If it is still below 70, do what you did in step 2 again. If has come back up, go ahead and eat a snack or small meal at this time.

## 2019-05-17 NOTE — Progress Notes (Signed)
    S:    PCP: Sharyn Lull  No chief complaint on file.  Patient arrives in good spirits.  Presents for diabetes evaluation, education, and management at the request of [place]. Patient was referred on 04/08/19 - I saw her on 04/26/19 and discontinued Levemir. We continued her metformin.   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  Current diabetes medications include: metformin 1000 mg BID Current hypertension medications include: losartan 100 mg, propranolol 20 mg BID Current hyperlipidemia medications include: none currently  Patient denies hypoglycemic events.  Patient reported dietary habits:  - Endorses diet heavy in rice and mac and cheese.  - Denies sugary foods and drinks  Patient-reported exercise habits: none outside of work   Patient denies nocturia.  Patient denies neuropathy. Patient denies visual changes. Patient denies self foot exams.     O:  POCT glucose: 84  Home CBG:  - Gives fasting range of 73 - 125 (1 outlier of 155 after ribs the night previous) - Denies post-prandial/random levels >180  Lab Results  Component Value Date   HGBA1C 9.5 (H) 03/21/2019   There were no vitals filed for this visit.  Lipid Panel     Component Value Date/Time   CHOL 192 06/12/2015 1756   TRIG 268 (H) 03/22/2019 1439   HDL 24 (L) 06/12/2015 1756   CHOLHDL 8.0 (H) 06/12/2015 1756   VLDL 28 06/12/2015 1756   LDLCALC 140 (H) 06/12/2015 1756   Clinical ASCVD: No  The ASCVD Risk score Mikey Bussing DC Jr., et al., 2013) failed to calculate for the following reasons:   Cannot find a previous HDL lab   Cannot find a previous total cholesterol lab   A/P: Diabetes newly diagnosed currently uncontrolled based on A1c; A1c goal <7%. Patient is able to verbalize appropriate hypoglycemia management plan. Patient is adherent with medication  and her home sugars are controlled only on metformin.   -Continue metformin only for now. -Extensively discussed pathophysiology of DM, recommended lifestyle interventions, dietary effects on glycemic control -Counseled on s/sx of and management of hypoglycemia -Next A1C anticipated August 2020.   ASCVD risk - primary prevention in patient with DM. Needs lipid panel. Will defer to PCP appt.   HM:   - Pneumovax given  Written patient instructions provided. Total time in face to face counseling 30 minutes.   Follow up in 1 month with PCP.     Benard Halsted, PharmD, [place] [phone]

## 2019-05-18 ENCOUNTER — Encounter: Payer: Self-pay | Admitting: Nurse Practitioner

## 2019-05-19 ENCOUNTER — Telehealth: Payer: Self-pay | Admitting: Pharmacist

## 2019-05-19 NOTE — Telephone Encounter (Signed)
Pt called reporting symptoms of a possible injection site reaction to the Pneumovax vaccine administered by me this past Monday (7/13). Patient reports erythema around the site with pain and soreness. She reports that she had pain extending through her arm with limited mobility on Monday night in to Tuesday. She did not take any OTC medications for the pain. She denies fever or dyspnea. Denies HA, dizziness.  She states that the area is not raised. She does not believe there are hives present and she is not experiencing throat itching. I explained that this is likely an injection site reaction and can occur.   She reports that today her pain has mostly subsided and mobilility has improved. She reports that the site is still sore to the touch but that pain is nothing compared to the previous pain. The redness is still present but per pt has not progressed or spread. I recommended for her to take OTC Tylenol. She takes an oral histamine at home for allergies prn. I encouraged her to take one of these as well. She has been applying ice to the area and has been increasing mobility as able. I encouraged this. She knows to contact us with any worsening symptoms or lack of improvement.   Will route to PCP.

## 2019-05-19 NOTE — Telephone Encounter (Signed)
FYI possible reaction. Please reach out to patient.

## 2019-05-20 NOTE — Telephone Encounter (Signed)
I think this needs to go to [facility]. I have not seen this patient for a PNA vaccine.

## 2019-05-24 ENCOUNTER — Other Ambulatory Visit: Payer: Self-pay | Admitting: Gastroenterology

## 2019-05-31 ENCOUNTER — Encounter: Payer: Self-pay | Admitting: Nurse Practitioner

## 2019-05-31 ENCOUNTER — Other Ambulatory Visit: Payer: Self-pay | Admitting: Primary Care

## 2019-06-03 ENCOUNTER — Encounter: Payer: BLUE CROSS/BLUE SHIELD | Attending: Nurse Practitioner | Admitting: Registered"

## 2019-06-03 ENCOUNTER — Other Ambulatory Visit: Payer: Self-pay

## 2019-06-03 DIAGNOSIS — E669 Obesity, unspecified: Secondary | ICD-10-CM | POA: Diagnosis not present

## 2019-06-03 DIAGNOSIS — E1169 Type 2 diabetes mellitus with other specified complication: Secondary | ICD-10-CM | POA: Diagnosis not present

## 2019-06-03 NOTE — Patient Instructions (Addendum)
Instructions/Goals:  -Continue working to eat every 3-5 hours while awake. Try to avoid going more than 5 hours without having some carbohydrates. See handouts for tips on balanced snacks. Recommend considering a balanced snack at your morning snack in place of prortein drink especially with having some low blood sugars around your lunch time. Recommend not eating closer than 2-3 hours before bed.   Make sure to get in three meals per day. Try to have balanced meals like the plate example (see handout). Include lean proteins, vegetables, fruits, and whole grains at meals.  -Recommend 2-3 carbohydrates choices per meal (30-45 g). If a food has a label want to refer to label for carbohydrate amount.   -Continue checking blood sugar as recommended by your doctor. Good job adding checking after meals as well. Will want to check within 1-2 hours of when you start your meal to see how your food has affected your blood sugar.   Make physical activity a part of your week. Try to include at least 30 minutes of physical activity 5 days each week or at least 150 minutes per week. Regular physical activity promotes overall health-including helping to reduce risk for heart disease and diabetes, promoting mental health, and helping [place] sleep better.  -Goal: Add 10 minutes of walking to your days and gradually work to 30 minutes most days.

## 2019-06-03 NOTE — Progress Notes (Signed)
Diabetes Self-Management Education  Visit Type:    Appt. Start Time: 1525 Appt. End Time: 2751  06/03/2019  [name], identified by name and date of birth, is a 43 y.o. female with a diagnosis of Diabetes:  .   ASSESSMENT  Weight 230 lb 4.8 oz (104.5 kg). Body mass index is 40.8 kg/m.   Pt present alone for visit. Pt reports she is happy about seeing more weight loss since last appointment. Pt has lost about 2 lb since last appointment on 04/29/19. Pt reports she is not setting a weight goal so she will not disappoint herself.   Pt reports having a low blood sugar today. Reports her blood sugar around 70 today right before she ate her lunch which she eats around 10 AM. Reports she did not feel "funny" when it was that number. After taking blood sugar pt reports she ate her lunch and took blood sugar again and it had went up within range.   Pt did not bring glucometer with her recent blood sugar readings with her to appointment. She reports she has been checking fasting in the morning, preprandial before lunch usually around 10 AM, about 2.5-3 hours after lunch, and 2-3 hours after dinner. Reports fasting lately has been between ~80-~146 and 2-3 hours after meals has been ~99-120 (pt estimated as she did not bring her log).   Pt reports that she started drinking some diet root beer recently and liked it. Reports that it did not cause problems with reflux. Pt reports she has not been having much trouble with reflux lately, however, she does report a burning in the back of her throat at times. Reports her doctor recommended taking Mylanta or another OTC reflux medication. Reports since starting her Metformin she has been having diarrhea multiple times a week but not every day. Reports her doctor talked with her about switching to extended release but she reports she is ok with staying on current form. Denies any vomiting.   Pt reports she does not have any questions regarding  carbohydrate counting. Reports she was told to watch her sodium intake d/t HTN. Pt commented on having multiple dietary guidelines to follow.   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 Usually may have cereal (cinnamon Chex mix cereal) 8 AM: Equate protein shake 10 AM: mac and cheese and mixed vegetables (peas, carrots, potatoes, green beans), water 1215: cheese stick  1 PM: Equate protein shake  3PM: peanut butter, apple jelly, Smart Balance mixed, 4 slices toasted honey wheat bread, 1% milk Beverages: ~24 oz water; 1% milk; frozen coffee  Individualized Plan for Diabetes Self-Management Training:  Learning Objective:  Patient will have a greater understanding of diabetes self-management. Patient education plan is to attend individual and/or group sessions per assessed needs and concerns.   Intervention: Reiterated using the balanced plate as example for meals as pt reports having to juggle a lot of different dietary needs. Discussed that right before lunch time at 10 am when pt reports having low blood sugar she has been about 6 hours without a real carbohydrate source, since protein drink only includes 5 g carbohydrates, and this is likely why she has seen such low numbers at this time. Recommended having a balanced snack: 1 carbohydrate source and protein at 8 AM snack in place of protein drink  and assessing how blood sugar is affected. Reviewed tx for hypoglycemia (15/15 rule). Discussed that goal time for checking postprandial blood sugar  is 1-2 hours after first bite of meal. Discussed using the 5/20 rule for helping to reduce sodium intake: limit foods with ~20% DV for sodium and look for those with ~5% or less. Encouraged pt to continue working toward activity goal. Pt appeared agreeable to information/goals discussed.   Instructions/Goals:  -Continue working to eat every 3-5 hours while awake. Try to avoid going more than 5 hours without  having some carbohydrates. See handouts for tips on balanced snacks. Recommend considering a balanced snack at your morning snack in place of prortein drink especially with having some low blood sugars around your lunch time. Recommend not eating closer than 2-3 hours before bed.   Make sure to get in three meals per day. Try to have balanced meals like the plate example (see handout). Include lean proteins, vegetables, fruits, and whole grains at meals.  -Recommend 2-3 carbohydrates choices per meal (30-45 g). If a food has a label want to refer to label for carbohydrate amount.   -Continue checking blood sugar as recommended by your doctor. Good job adding checking after meals as well. Will want to check within 1-2 hours of when you start your meal to see how your food has affected your blood sugar.   Make physical activity a part of your week. Try to include at least 30 minutes of physical activity 5 days each week or at least 150 minutes per week. Regular physical activity promotes overall health-including helping to reduce risk for heart disease and diabetes, promoting mental health, and helping [place] sleep better.  -Goal: Add 10 minutes of walking to your days and gradually work to 30 minutes most days.  Patient Instructions  Instructions/Goals:  -Continue working to eat every 3-5 hours while awake. Try to avoid going more than 5 hours without having some carbohydrates. See handouts for tips on balanced snacks. Recommend considering a balanced snack at your morning snack in place of prortein drink especially with having some low blood sugars around your lunch time. Recommend not eating closer than 2-3 hours before bed.   Make sure to get in three meals per day. Try to have balanced meals like the plate example (see handout). Include lean proteins, vegetables, fruits, and whole grains at meals.  -Recommend 2-3 carbohydrates choices per meal (30-45 g). If a food has a label want to refer to  label for carbohydrate amount.   -Continue checking blood sugar as recommended by your doctor. Good job adding checking after meals as well. Will want to check within 1-2 hours of when you start your meal to see how your food has affected your blood sugar.   Make physical activity a part of your week. Try to include at least 30 minutes of physical activity 5 days each week or at least 150 minutes per week. Regular physical activity promotes overall health-including helping to reduce risk for heart disease and diabetes, promoting mental health, and helping [place] sleep better.  -Goal: Add 10 minutes of walking to your days and gradually work to 30 minutes most days.  Expected Outcomes:     Education material provided: Snack sheet,   If problems or questions, patient to contact team via:  Phone and Email  Future DSME appointment:  1 month

## 2019-06-07 ENCOUNTER — Ambulatory Visit: Payer: Self-pay

## 2019-06-07 ENCOUNTER — Ambulatory Visit (INDEPENDENT_AMBULATORY_CARE_PROVIDER_SITE_OTHER): Payer: BLUE CROSS/BLUE SHIELD | Admitting: Physician Assistant

## 2019-06-07 ENCOUNTER — Encounter: Payer: Self-pay | Admitting: Physician Assistant

## 2019-06-07 DIAGNOSIS — M542 Cervicalgia: Secondary | ICD-10-CM

## 2019-06-07 DIAGNOSIS — M79602 Pain in left arm: Secondary | ICD-10-CM

## 2019-06-07 MED ORDER — CYCLOBENZAPRINE HCL 10 MG PO TABS: 10.0000 mg | ORAL_TABLET | Freq: Three times a day (TID) | ORAL | 0 refills | Status: DC | PRN

## 2019-06-07 NOTE — Progress Notes (Signed)
Office Visit Note   Patient: Angela Castillo           Date of Birth: 07/23/1976           MRN: [id number] Visit Date: 06/07/2019              Requested by: Kerin Perna, NP [address] [place],  [place] [address] PCP: Kerin Perna, NP   Assessment & Plan: Visit Diagnoses:  1. Left arm pain   2. Cervicalgia     Plan: Due to the fact that patient is been recently diagnosed with diabetes and her recent GI bleed would not recommend any prednisone or NSAIDs.  Therefore we will place her on Flexeril 10 mg 1 p.o. 3 times daily.  Send her to formal physical therapy for her cervical spine.  They will perform stretching, strengthening, range of motion, modalities and teach her home exercise program.  We will see her back in 3 weeks.  If her pain persist or becomes worse most likely will obtain an MRI to rule out HNP as source of her radicular symptoms down the left arm.  Questions were encouraged and answered  Follow-Up Instructions: Return in about 3 weeks (around 06/28/2019).   Orders:  Orders Placed This Encounter  Procedures  . XR Cervical Spine 2 or 3 views  . XR Shoulder Left   Meds ordered this encounter  Medications  . cyclobenzaprine (FLEXERIL) 10 MG tablet    Sig: Take 1 tablet (10 mg total) by mouth 3 (three) times daily as needed for muscle spasms.    Dispense:  30 tablet    Refill:  0      Procedures: No procedures performed   Clinical Data: No additional findings.   Subjective: Chief Complaint  Patient presents with  . Left Shoulder - Pain  . Neck - Pain    HPI Angela Castillo is a 43 year old female comes in today for left shoulder pain and left arm numbness tingling.  States the pain radiates down her entire arm into her fingers and involves all the fingers.  She notes that she is having some pain in her lower neck.  She is had no new injury.  She is recently been diagnosed with diabetes and had a recent GI bleed which she was hospitalized  with.  Review of Systems See HPI otherwise negative  Objective: Vital Signs: There were no vitals taken for this visit.  Physical Exam Constitutional:      Appearance: She is not ill-appearing or diaphoretic.  Cardiovascular:     Pulses: Normal pulses.  Pulmonary:     Effort: Pulmonary effort is normal.  Neurological:     Mental Status: She is alert and oriented to person, place, and time.  Psychiatric:        Mood and Affect: Mood normal.     Ortho Exam Cervical spine she has full forward flexion extension.  Negative Spurling's.  She has tenderness over the lower cervical spine.  Tenderness over the left medial border of the scapula.  5 out of 5 strength throughout the upper extremities against resistance.  Negative impingement bilateral shoulders.  Rotator cuff is strong with external/internal rotation bilaterally.  Full sensation full motor bilateral hands. Specialty Comments:  No specialty comments available.  Imaging: Xr Cervical Spine 2 Or 3 Views  Result Date: 06/07/2019 Cervical spine 3 views: Slight loss of lordotic curvature.  No acute fractures.  Did space overall well-maintained.  No spondylolisthesis.  Xr Shoulder Left  Result Date: 06/07/2019 Left shoulder 3 views: Glenohumeral joint is well-maintained.  Shoulders well located.  Subacromial space is well-maintained.  No acute fractures or bony abnormalities.    PMFS History: Patient Active Problem List   Diagnosis Date Noted  . Diabetes mellitus ([place]) 04/20/2019  . Acute respiratory insufficiency   . Respiratory failure ([place])   . Shock circulatory ([place])   . GI bleed 03/21/2019  . Impingement syndrome of left shoulder 04/21/2017  . Low back pain 03/18/2016  . Left shoulder pain 03/18/2016  . Esophageal reflux 03/18/2016  . Lateral pain of left hip 10/25/2015  . Heel pain, bilateral 05/03/2015  . Obesity (BMI 30-39.9) 05/03/2015  . Hearing loss in right ear 03/01/2015  . Family history of diabetes  mellitus in mother 03/01/2015  . Essential hypertension 01/18/2015  . Anemia, iron deficiency 01/04/2015  . Musculoskeletal chest pain 01/04/2015  . Transaminitis 01/03/2015   Past Medical History:  Diagnosis Date  . Anemia Dx January 03 2015  . Elevated liver enzymes   . Hypertension Dx Apr 2016  . Reflux     Family History  Problem Relation Age of Onset  . COPD Mother   . Esophageal cancer Mother   . Diabetes Mother   . Cancer Mother   . Hypertension Mother   . Diabetes Father     Past Surgical History:  Procedure Laterality Date  . ESOPHAGEAL BANDING  03/22/2019   Procedure: ESOPHAGEAL BANDING;  Surgeon: Wilford Corner, MD;  Location: [facility];  Service: Endoscopy;;  . ESOPHAGEAL BANDING  03/23/2019   Procedure: ESOPHAGEAL BANDING;  Surgeon: Wilford Corner, MD;  Location: [facility];  Service: Endoscopy;;  . ESOPHAGOGASTRODUODENOSCOPY N/A 03/22/2019   Procedure: ESOPHAGOGASTRODUODENOSCOPY (EGD);  Surgeon: Wilford Corner, MD;  Location: [facility];  Service: Endoscopy;  Laterality: N/A;  . ESOPHAGOGASTRODUODENOSCOPY N/A 03/23/2019   Procedure: ESOPHAGOGASTRODUODENOSCOPY (EGD);  Surgeon: Wilford Corner, MD;  Location: [facility];  Service: Endoscopy;  Laterality: N/A;  . TUBAL LIGATION  Jul 25, 2000   Social History   Occupational History  . Not on file  Tobacco Use  . Smoking status: Never Smoker  . Smokeless tobacco: Never Used  Substance and Sexual Activity  . Alcohol use: Yes    Comment: rarely  . Drug use: No  . Sexual activity: Not on file

## 2019-06-09 DIAGNOSIS — M6281 Muscle weakness (generalized): Secondary | ICD-10-CM | POA: Diagnosis not present

## 2019-06-09 DIAGNOSIS — R293 Abnormal posture: Secondary | ICD-10-CM | POA: Diagnosis not present

## 2019-06-09 DIAGNOSIS — M25512 Pain in left shoulder: Secondary | ICD-10-CM | POA: Diagnosis not present

## 2019-06-09 DIAGNOSIS — M542 Cervicalgia: Secondary | ICD-10-CM | POA: Diagnosis not present

## 2019-06-11 ENCOUNTER — Other Ambulatory Visit: Payer: Self-pay | Admitting: Primary Care

## 2019-06-11 DIAGNOSIS — M25512 Pain in left shoulder: Secondary | ICD-10-CM | POA: Diagnosis not present

## 2019-06-11 DIAGNOSIS — M6281 Muscle weakness (generalized): Secondary | ICD-10-CM | POA: Diagnosis not present

## 2019-06-11 DIAGNOSIS — M542 Cervicalgia: Secondary | ICD-10-CM | POA: Diagnosis not present

## 2019-06-11 DIAGNOSIS — R293 Abnormal posture: Secondary | ICD-10-CM | POA: Diagnosis not present

## 2019-06-11 NOTE — Telephone Encounter (Signed)
Patient scheduled for OV with you on 06/14/2019. Has not been seen by a provider at [facility] since 2018. Please refill if apppropriate. Nat Christen, CMA

## 2019-06-12 NOTE — Telephone Encounter (Signed)
She saw [place] in June. She can address. Thanks.

## 2019-06-14 ENCOUNTER — Other Ambulatory Visit: Payer: Self-pay

## 2019-06-14 ENCOUNTER — Ambulatory Visit: Payer: BLUE CROSS/BLUE SHIELD | Attending: Nurse Practitioner | Admitting: Nurse Practitioner

## 2019-06-14 ENCOUNTER — Encounter: Payer: Self-pay | Admitting: Nurse Practitioner

## 2019-06-14 DIAGNOSIS — Z1231 Encounter for screening mammogram for malignant neoplasm of breast: Secondary | ICD-10-CM

## 2019-06-14 DIAGNOSIS — E1165 Type 2 diabetes mellitus with hyperglycemia: Secondary | ICD-10-CM | POA: Diagnosis not present

## 2019-06-14 DIAGNOSIS — M25512 Pain in left shoulder: Secondary | ICD-10-CM | POA: Diagnosis not present

## 2019-06-14 DIAGNOSIS — M542 Cervicalgia: Secondary | ICD-10-CM | POA: Diagnosis not present

## 2019-06-14 DIAGNOSIS — K219 Gastro-esophageal reflux disease without esophagitis: Secondary | ICD-10-CM

## 2019-06-14 DIAGNOSIS — M6281 Muscle weakness (generalized): Secondary | ICD-10-CM | POA: Diagnosis not present

## 2019-06-14 DIAGNOSIS — I10 Essential (primary) hypertension: Secondary | ICD-10-CM | POA: Diagnosis not present

## 2019-06-14 DIAGNOSIS — E782 Mixed hyperlipidemia: Secondary | ICD-10-CM | POA: Diagnosis not present

## 2019-06-14 DIAGNOSIS — IMO0002 Reserved for concepts with insufficient information to code with codable children: Secondary | ICD-10-CM

## 2019-06-14 DIAGNOSIS — R293 Abnormal posture: Secondary | ICD-10-CM | POA: Diagnosis not present

## 2019-06-14 MED ORDER — PANTOPRAZOLE SODIUM 40 MG PO TBEC: 40.0000 mg | DELAYED_RELEASE_TABLET | Freq: Every day | ORAL | 1 refills | Status: DC

## 2019-06-14 MED ORDER — LOSARTAN POTASSIUM 100 MG PO TABS: 100.0000 mg | ORAL_TABLET | Freq: Every day | ORAL | 1 refills | Status: DC

## 2019-06-14 MED ORDER — PROPRANOLOL HCL 20 MG PO TABS: 20.0000 mg | ORAL_TABLET | Freq: Two times a day (BID) | ORAL | 3 refills | Status: DC

## 2019-06-14 NOTE — Progress Notes (Signed)
Virtual Visit via Telephone Note Due to national recommendations of social distancing due to [place] 19, telehealth visit is felt to be most appropriate for this patient at this time.  I discussed the limitations, risks, security and privacy concerns of performing an evaluation and management service by telephone and the availability of in person appointments. I also discussed with the patient that there may be a patient responsible charge related to this service. The patient expressed understanding and agreed to proceed.    I connected with Angela Castillo on 06/14/19  at  10:30 AM EDT  EDT by telephone and verified that I am speaking with the correct person using two identifiers.   Consent I discussed the limitations, risks, security and privacy concerns of performing an evaluation and management service by telephone and the availability of in person appointments. I also discussed with the patient that there may be a patient responsible charge related to this service. The patient expressed understanding and agreed to proceed.   Location of Patient: Private Residence    Location of Provider: [place] [facility] [employer] Office    Persons participating in Telemedicine visit: [name]    History of Present Illness: Telemedicine visit for: Establish care  has a past medical history of Anemia (Dx January 03 2015), Diabetes mellitus without complication ([place]), Elevated liver enzymes, Essential hypertension (Dx Apr 2016), H/O: upper GI bleed, Liver cirrhosis secondary to NASH ([place]), and Reflux.  DM TYPE 2 Lowest reading 70. Highest 162. "Splurging here and there with meals.". Lost weight: 224lbs down from 230. Metformin 1000 mg BID. She was not consistently taking Levemir.  She has cut back on milk. She is working with a diabetes nutritionist. Switched over to diet sodas. She does not have a formal exercise routine. 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  Lab Results  Component Value Date   HGBA1C 9.5 (H) 03/21/2019   Essential Hypertension She does not monitor her blood pressure at home. Taking losartan 100 mg daily. Denies chest pain, shortness of breath, palpitations, lightheadedness, dizziness, headaches or BLE edema.  BP Readings from Last 3 Encounters:  04/08/19 128/83  03/29/19 (!) 141/85  07/13/18 (!) 163/94    Mixed Hyperlipidemia LDL not at goal. Will start crestor based on history of cirrhosis 2/2 NASH.  She denies any statin intolerance Lab Results  Component Value Date   CHOL 192 06/12/2015   Lab Results  Component Value Date   HDL 24 (L) 06/12/2015   Lab Results  Component Value Date   LDLCALC 140 (H) 06/12/2015   Lab Results  Component Value Date   TRIG 268 (H) 03/22/2019   TRIG 142 06/12/2015   Lab Results  Component Value Date   CHOLHDL 8.0 (H) 06/12/2015    Past Medical History:  Diagnosis Date  . Anemia Dx January 03 2015  . Diabetes mellitus without complication ([place])   . Elevated liver enzymes   . Essential hypertension Dx Apr 2016  . H/O: upper GI bleed   . Liver cirrhosis secondary to NASH ([place])   . Reflux     Past Surgical History:  Procedure Laterality Date  . ESOPHAGEAL BANDING  03/22/2019   Procedure: ESOPHAGEAL BANDING;  Surgeon: Wilford Corner, MD;  Location: [facility];  Service: Endoscopy;;  . ESOPHAGEAL BANDING  03/23/2019   Procedure: ESOPHAGEAL BANDING;  Surgeon: Wilford Corner, MD;  [facility];  Service: Endoscopy;;  . ESOPHAGOGASTRODUODENOSCOPY N/A  03/22/2019   Procedure: ESOPHAGOGASTRODUODENOSCOPY (EGD);  Surgeon: Wilford Corner, MD;  Location: [facility];  Service: Endoscopy;  Laterality: N/A;  . ESOPHAGOGASTRODUODENOSCOPY N/A 03/23/2019   Procedure: ESOPHAGOGASTRODUODENOSCOPY (EGD);  Surgeon: Wilford Corner, MD;  Location: [facility];  Service: Endoscopy;  Laterality: N/A;  . TUBAL LIGATION  Jul 25, 2000     Family History  Problem Relation Age of Onset  . COPD Mother   . Esophageal cancer Mother   . Diabetes Mother   . Cancer Mother   . Hypertension Mother   . Diabetes Father     Social History   Socioeconomic History  . Marital status: Married    Spouse name: Not on file  . Number of children: Not on file  . Years of education: Not on file  . Highest education level: Not on file  Occupational History  . Not on file  Social Needs  . Financial resource strain: Not on file  . Food insecurity    Worry: Not on file    Inability: Not on file  . Transportation needs    Medical: Not on file    Non-medical: Not on file  Tobacco Use  . Smoking status: Never Smoker  . Smokeless tobacco: Never Used  Substance and Sexual Activity  . Alcohol use: Yes    Comment: rarely  . Drug use: No  . Sexual activity: Yes  Lifestyle  . Physical activity    Days per week: Not on file    Minutes per session: Not on file  . Stress: Not on file  Relationships  . Social Herbalist on phone: Not on file    Gets together: Not on file    Attends religious service: Not on file    Active member of club or organization: Not on file    Attends meetings of clubs or organizations: Not on file    Relationship status: Not on file  Other Topics Concern  . Not on file  Social History Narrative  . Not on file     Observations/Objective: Awake, alert and oriented x 3   Review of Systems  Constitutional: Negative for fever, malaise/fatigue and weight loss.  HENT: Negative.  Negative for nosebleeds.   Eyes: Negative.  Negative for blurred vision, double vision and photophobia.  Respiratory: Negative.  Negative for cough and shortness of breath.   Cardiovascular: Negative.  Negative for chest pain, palpitations and leg swelling.  Gastrointestinal: Positive for heartburn. Negative for nausea and vomiting.  Musculoskeletal: Negative.  Negative for myalgias.  Neurological: Negative.  Negative  for dizziness, focal weakness, seizures and headaches.  Psychiatric/Behavioral: Negative.  Negative for suicidal ideas.    Assessment and Plan: Angela Castillo was seen today for establish care.  Diagnoses and all orders for this visit:  Diabetes mellitus type 2, uncontrolled, with complications ([place]) -     Ambulatory referral to Ophthalmology -     CBC; Future -     Hemoglobin A1c; Future -     Microalbumin/Creatinine Ratio, Urine; Future Continue blood sugar control as discussed in office today, low carbohydrate diet, and regular physical exercise as tolerated, 150 minutes per week (30 min each day, 5 days per week, or 50 min 3 days per week). Keep blood sugar logs with fasting goal of 90-130 mg/dl, post prandial (after you eat) less than 180.  For Hypoglycemia: BS <60 and Hyperglycemia BS >400; contact the clinic ASAP. Annual eye exams and foot exams are recommended.   Mixed hyperlipidemia -  Lipid panel; Future INSTRUCTIONS: Work on a low fat, heart healthy diet and participate in regular aerobic exercise program by working out at least 150 minutes per week; 5 days a week-30 minutes per day. Avoid red meat, fried foods. junk foods, sodas, sugary drinks, unhealthy snacking, alcohol and smoking.  Drink at least 48oz of water per day and monitor your carbohydrate intake daily.    Essential hypertension -     losartan (COZAAR) 100 MG tablet; Take 1 tablet (100 mg total) by mouth daily. Continue all antihypertensives as prescribed.  Remember to bring in your blood pressure log with you for your follow up appointment.  DASH/Mediterranean Diets are healthier choices for HTN.    Breast cancer screening by mammogram -     MM 3D SCREEN BREAST BILATERAL; Future  Gastroesophageal reflux disease, esophagitis presence not specified -     pantoprazole (PROTONIX) 40 MG tablet; Take 1 tablet (40 mg total) by mouth daily. -     propranolol (INDERAL) 20 MG tablet; Take 1 tablet (20 mg total) by mouth  2 (two) times daily. INSTRUCTIONS: Avoid GERD Triggers: acidic, spicy or fried foods, caffeine, coffee, sodas,  alcohol and chocolate.    Follow Up Instructions Return for Fasting labs.     I discussed the assessment and treatment plan with the patient. The patient was provided an opportunity to ask questions and all were answered. The patient agreed with the plan and demonstrated an understanding of the instructions.   The patient was advised to call back or seek an in-person evaluation if the symptoms worsen or if the condition fails to improve as anticipated.  I provided 24 minutes of non-face-to-face time during this encounter including median intraservice time, reviewing previous notes, labs, imaging, medications and explaining diagnosis and management.  Gildardo Pounds, FNP-BC

## 2019-06-18 DIAGNOSIS — M25512 Pain in left shoulder: Secondary | ICD-10-CM | POA: Diagnosis not present

## 2019-06-18 DIAGNOSIS — M542 Cervicalgia: Secondary | ICD-10-CM | POA: Diagnosis not present

## 2019-06-18 DIAGNOSIS — M6281 Muscle weakness (generalized): Secondary | ICD-10-CM | POA: Diagnosis not present

## 2019-06-18 DIAGNOSIS — R293 Abnormal posture: Secondary | ICD-10-CM | POA: Diagnosis not present

## 2019-06-20 ENCOUNTER — Encounter: Payer: Self-pay | Admitting: Nurse Practitioner

## 2019-06-21 DIAGNOSIS — R293 Abnormal posture: Secondary | ICD-10-CM | POA: Diagnosis not present

## 2019-06-21 DIAGNOSIS — M6281 Muscle weakness (generalized): Secondary | ICD-10-CM | POA: Diagnosis not present

## 2019-06-21 DIAGNOSIS — M25512 Pain in left shoulder: Secondary | ICD-10-CM | POA: Diagnosis not present

## 2019-06-21 DIAGNOSIS — M542 Cervicalgia: Secondary | ICD-10-CM | POA: Diagnosis not present

## 2019-06-25 ENCOUNTER — Ambulatory Visit: Payer: BC Managed Care – PPO | Attending: Primary Care

## 2019-06-25 ENCOUNTER — Other Ambulatory Visit: Payer: Self-pay

## 2019-06-25 DIAGNOSIS — E1165 Type 2 diabetes mellitus with hyperglycemia: Secondary | ICD-10-CM | POA: Diagnosis not present

## 2019-06-25 DIAGNOSIS — E782 Mixed hyperlipidemia: Secondary | ICD-10-CM | POA: Diagnosis not present

## 2019-06-25 DIAGNOSIS — IMO0002 Reserved for concepts with insufficient information to code with codable children: Secondary | ICD-10-CM

## 2019-06-25 DIAGNOSIS — E118 Type 2 diabetes mellitus with unspecified complications: Secondary | ICD-10-CM | POA: Diagnosis not present

## 2019-06-26 LAB — CBC
Hematocrit: 39.8 % (ref 34.0–46.6)
Hemoglobin: 12.2 g/dL (ref 11.1–15.9)
MCH: 25.3 pg — ABNORMAL LOW (ref 26.6–33.0)
MCHC: 30.7 g/dL — ABNORMAL LOW (ref 31.5–35.7)
MCV: 83 fL (ref 79–97)
Platelets: 75 10*3/uL — CL (ref 150–450)
RBC: 4.82 x10E6/uL (ref 3.77–5.28)
RDW: 14.5 % (ref 11.7–15.4)
WBC: 7 10*3/uL (ref 3.4–10.8)

## 2019-06-26 LAB — MICROALBUMIN / CREATININE URINE RATIO
Creatinine, Urine: 90.9 mg/dL
Microalb/Creat Ratio: 15 mg/g creat (ref 0–29)
Microalbumin, Urine: 13.4 ug/mL

## 2019-06-26 LAB — LIPID PANEL
Chol/HDL Ratio: 4.8 ratio — ABNORMAL HIGH (ref 0.0–4.4)
Cholesterol, Total: 159 mg/dL (ref 100–199)
HDL: 33 mg/dL — ABNORMAL LOW (ref >39)
LDL Calculated: 105 mg/dL — ABNORMAL HIGH (ref 0–99)
Triglycerides: 104 mg/dL (ref 0–149)
VLDL Cholesterol Cal: 21 mg/dL (ref 5–40)

## 2019-06-26 LAB — HEMOGLOBIN A1C
Est. average glucose Bld gHb Est-mCnc: 108 mg/dL
Hgb A1c MFr Bld: 5.4 % (ref 4.8–5.6)

## 2019-06-28 ENCOUNTER — Encounter: Payer: Self-pay | Admitting: Nurse Practitioner

## 2019-06-28 ENCOUNTER — Ambulatory Visit: Payer: BLUE CROSS/BLUE SHIELD | Admitting: Physician Assistant

## 2019-06-28 ENCOUNTER — Other Ambulatory Visit: Payer: Self-pay | Admitting: Nurse Practitioner

## 2019-06-28 DIAGNOSIS — D696 Thrombocytopenia, unspecified: Secondary | ICD-10-CM

## 2019-06-28 DIAGNOSIS — M542 Cervicalgia: Secondary | ICD-10-CM | POA: Diagnosis not present

## 2019-06-28 DIAGNOSIS — M25512 Pain in left shoulder: Secondary | ICD-10-CM | POA: Diagnosis not present

## 2019-06-28 DIAGNOSIS — R293 Abnormal posture: Secondary | ICD-10-CM | POA: Diagnosis not present

## 2019-06-28 DIAGNOSIS — M6281 Muscle weakness (generalized): Secondary | ICD-10-CM | POA: Diagnosis not present

## 2019-07-01 ENCOUNTER — Telehealth: Payer: Self-pay | Admitting: Hematology and Oncology

## 2019-07-01 NOTE — Telephone Encounter (Signed)
Received a new hem referral from [name], NP for thrombocytopenia. Angela Castillo has been cld and scheduled to see Dr. Lindi Adie on 9/15 at 345pm. I offered an appt for today, but she stated she doesn't get off of work until 2pm and wouldn't be able to make it. Aware to arrive 15 minutes early for her scheduled appt.

## 2019-07-08 ENCOUNTER — Other Ambulatory Visit: Payer: Self-pay

## 2019-07-08 ENCOUNTER — Encounter: Payer: BC Managed Care – PPO | Attending: Nurse Practitioner | Admitting: Registered"

## 2019-07-08 DIAGNOSIS — E669 Obesity, unspecified: Secondary | ICD-10-CM

## 2019-07-08 DIAGNOSIS — E1169 Type 2 diabetes mellitus with other specified complication: Secondary | ICD-10-CM | POA: Diagnosis not present

## 2019-07-08 NOTE — Progress Notes (Signed)
Diabetes Self-Management Education  Visit Type:    Appt. Start Time: 1533 Appt. End Time: 4650  07/08/2019  [name], identified by name and date of birth, is a 43 y.o. female with a diagnosis of Diabetes:  .   ASSESSMENT  Weight 223 lb (101.2 kg), last menstrual period 06/28/2019. Body mass index is 39.5 kg/m.   Pt present alone for visit. Pt reports she is happy about seeing weight loss. Pt reports she has had two low blood sugars (62 and 65) in the past month since last appointment. Reports one time she drank some orange juice, maybe a couple ounces, and it came back up. Reports last Monday when she took her sugar and it was 62, she grabbed a soda and drank a small amount and her blood sugar went back up. Reports she did not feel any symptoms. Both lows occurred ~930-10 AM while at work. Reports one morning her blood sugar was well over the range, she believes she was able to pinpoint that it was d/t something she ate the night before. Pt usually goes to bed between 06-1029 PM. Reports her fasting blood sugars have been between 70s-125 most of the time and once in past month was over 130. Reports 1-2 hours after meals it has been ~80s-120. Reports she has been trying to cut out some more things and have more balanced snacks such as Triscuits and string cheese. May have been more active on days she had low but reports innake was about the same. Noted pt's most recent hgbA1c on 06/25/19 was 5.4.   Pt reports she has an appointment with her hematologist soon d/t recent lab work. Reports having several procedures and appointments this month.    24-Hour Recall: Work Day 330-4 AM: Cinnamon Chex cereal, 1% milk  8 AM: Tricuits and cheese  10 AM: mac and cheese, canned chicken, SF pudding, water 1215: cheese stick   1 PM: Equate protein shake  330PM: salad with spring mix, mushrooms, cheese, Skinny Girl poppyseed dressing, no protein added, 1% milk 530PM: 2 sugar free pudding pops   Beverages: ~24 oz water; 1 cup 1% milk  Individualized Plan for Diabetes Self-Management Training:  Learning Objective:  Patient will have a greater understanding of diabetes self-management. Patient education plan is to attend individual and/or group sessions per assessed needs and concerns.   Intervention: Reviewed 15/15 rule for low blood sugar. Reviewed carbohydrate counting and encouraged pt to include consistent amount of 2-3 carbohydrate choices at each meal to help balance blood sugar and prevent lows. Reviewed balanced meal guidelines. Discussed that some of pt's meals discussed (dinner on previous night) are very low in carbohydrates which could be contributing to low blood sugars. Pt appeared agreeable to information/goals discussed.   Instructions/Goals:  -Continue working to eat every 3-5 hours while awake. Try to avoid going more than 5 hours without having some carbohydrates. See handouts for tips on balanced snacks. Recommend considering a balanced snack at your morning snack in place of protein drink especially with having some low blood sugars around your lunch time. Recommend not eating closer than 2-3 hours before bed.   Make sure to get in three meals per day. Try to have balanced meals like the plate example (see handout). Include lean proteins, vegetables, fruits, and whole grains at meals.  -Recommend 2-3 carbohydrates choices per meal (30-45 g). If a food has a label want to refer to label for carbohydrate amount. Make sure to include some carbohydrates (2-3  carbohydrate choices) at each meal to help prevent lows and provide adequate nutrition. -If there are more 5 hours between dinner and bedtime, recommend having a balanced snack to help prevent morning lows. Will still want to avoid eating closer than 2-3 hours of laying down.    -Continue checking blood sugar as recommended by your doctor. Good job adding checking after meals as well. Will want to check within 1-2  hours of when you start your meal to see how your food has affected your blood sugar.   Make physical activity a part of your week. Try to include at least 30 minutes of physical activity 5 days each week or at least 150 minutes per week. Regular physical activity promotes overall health-including helping to reduce risk for heart disease and diabetes, promoting mental health, and helping [place] sleep better.  -Goal: Add 10 minutes of walking to your days and gradually work to 30 minutes most days.  Patient Instructions  Instructions/Goals:  -Continue working to eat every 3-5 hours while awake. Try to avoid going more than 5 hours without having some carbohydrates. See handouts for tips on balanced snacks. Recommend considering a balanced snack at your morning snack in place of protein drink especially with having some low blood sugars around your lunch time. Recommend not eating closer than 2-3 hours before bed.   Make sure to get in three meals per day. Try to have balanced meals like the plate example (see handout). Include lean proteins, vegetables, fruits, and whole grains at meals.  -Recommend 2-3 carbohydrates choices per meal (30-45 g). If a food has a label want to refer to label for carbohydrate amount. Make sure to include some carbohydrates (2-3 carbohydrate choices) at each meal to help prevent lows and provide adequate nutrition. -If there are more 5 hours between dinner and bedtime, recommend having a balanced snack to help prevent morning lows. Will still want to avoid eating closer than 2-3 hours of laying down.    -Continue checking blood sugar as recommended by your doctor. Good job adding checking after meals as well. Will want to check within 1-2 hours of when you start your meal to see how your food has affected your blood sugar.   Make physical activity a part of your week. Try to include at least 30 minutes of physical activity 5 days each week or at least 150 minutes per  week. Regular physical activity promotes overall health-including helping to reduce risk for heart disease and diabetes, promoting mental health, and helping [place] sleep better.  -Goal: Add 10 minutes of walking to your days and gradually work to 30 minutes most days.   If problems or questions, patient to contact team via:  Phone and Email  Future DSME appointment:  1 month

## 2019-07-08 NOTE — Patient Instructions (Addendum)
Instructions/Goals:  -Continue working to eat every 3-5 hours while awake. Try to avoid going more than 5 hours without having some carbohydrates. See handouts for tips on balanced snacks. Recommend considering a balanced snack at your morning snack in place of protein drink especially with having some low blood sugars around your lunch time. Recommend not eating closer than 2-3 hours before bed.   Make sure to get in three meals per day. Try to have balanced meals like the plate example (see handout). Include lean proteins, vegetables, fruits, and whole grains at meals.  -Recommend 2-3 carbohydrates choices per meal (30-45 g). If a food has a label want to refer to label for carbohydrate amount. Make sure to include some carbohydrates (2-3 carbohydrate choices) at each meal to help prevent lows and provide adequate nutrition. -If there are more 5 hours between dinner and bedtime, recommend having a balanced snack to help prevent morning lows. Will still want to avoid eating closer than 2-3 hours of laying down.    -Continue checking blood sugar as recommended by your doctor. Good job adding checking after meals as well. Will want to check within 1-2 hours of when you start your meal to see how your food has affected your blood sugar.   Make physical activity a part of your week. Try to include at least 30 minutes of physical activity 5 days each week or at least 150 minutes per week. Regular physical activity promotes overall health-including helping to reduce risk for heart disease and diabetes, promoting mental health, and helping [place] sleep better.  -Goal: Add 10 minutes of walking to your days and gradually work to 30 minutes most days.

## 2019-07-13 ENCOUNTER — Encounter: Payer: Self-pay | Admitting: Nurse Practitioner

## 2019-07-13 ENCOUNTER — Ambulatory Visit (HOSPITAL_BASED_OUTPATIENT_CLINIC_OR_DEPARTMENT_OTHER): Payer: BC Managed Care – PPO | Admitting: Pharmacist

## 2019-07-13 ENCOUNTER — Other Ambulatory Visit: Payer: Self-pay

## 2019-07-13 ENCOUNTER — Ambulatory Visit: Payer: BC Managed Care – PPO | Attending: Nurse Practitioner | Admitting: Nurse Practitioner

## 2019-07-13 VITALS — BP 137/91 | HR 70 | Temp 99.2°F | Ht 64.5 in | Wt 227.0 lb

## 2019-07-13 DIAGNOSIS — I10 Essential (primary) hypertension: Secondary | ICD-10-CM

## 2019-07-13 DIAGNOSIS — IMO0002 Reserved for concepts with insufficient information to code with codable children: Secondary | ICD-10-CM

## 2019-07-13 DIAGNOSIS — R8290 Unspecified abnormal findings in urine: Secondary | ICD-10-CM | POA: Diagnosis not present

## 2019-07-13 DIAGNOSIS — Z23 Encounter for immunization: Secondary | ICD-10-CM | POA: Diagnosis not present

## 2019-07-13 DIAGNOSIS — E1165 Type 2 diabetes mellitus with hyperglycemia: Secondary | ICD-10-CM | POA: Diagnosis not present

## 2019-07-13 LAB — GLUCOSE, POCT (MANUAL RESULT ENTRY): POC Glucose: 97 mg/dl (ref 70–99)

## 2019-07-13 LAB — POCT URINALYSIS DIP (CLINITEK)
Bilirubin, UA: NEGATIVE
Blood, UA: NEGATIVE
Glucose, UA: NEGATIVE mg/dL
Ketones, POC UA: NEGATIVE mg/dL
Leukocytes, UA: NEGATIVE
Nitrite, UA: NEGATIVE
POC PROTEIN,UA: NEGATIVE
Spec Grav, UA: 1.025 (ref 1.010–1.025)
Urobilinogen, UA: 1 U/dL
pH, UA: 6.5 (ref 5.0–8.0)

## 2019-07-13 MED ORDER — AMLODIPINE BESYLATE 5 MG PO TABS: 5.0000 mg | ORAL_TABLET | Freq: Every day | ORAL | 3 refills | Status: DC

## 2019-07-13 NOTE — Progress Notes (Signed)
Patient presents for vaccination against influenza per orders of Zelda. Consent given. Counseling provided. No contraindications exists. Vaccine administered without incident.

## 2019-07-13 NOTE — Progress Notes (Signed)
Assessment & Plan:  Angela Castillo was seen today for blood pressure check.  Diagnoses and all orders for this visit:  Essential hypertension -     CMP14+EGFR  Diabetes mellitus type 2, uncontrolled, with complications ([facility]) -     Glucose (CBG)    Patient has been counseled on age-appropriate routine health concerns for screening and prevention. These are reviewed and up-to-date. Referrals have been placed accordingly. Immunizations are up-to-date or declined.    Subjective:   Chief Complaint  Patient presents with  . Blood Pressure Check   HPI Angela Castillo 43 y.o. female presents to office today for HTN. She has significant thrombocytopenia and has an appointment scheduled with hematology    Essential Hypertension Taking losartan 100 mg daily as prescribed. Blood pressure is not well controlled. She does not have a way to monitor it at home. Will add low dose amlodipine 5 mg today. Denies chest pain, shortness of breath, palpitations, lightheadedness, dizziness, headaches or BLE edema.  BP Readings from Last 3 Encounters:  07/13/19 (!) 149/81  04/08/19 128/83  03/29/19 (!) 141/85    GU Symptoms States urine has an ammonia smell. Denies any dark color to urine, dysuria, fever, or flank pain.  She states she has been trying to drink more water. Associated symptoms: intermittent mild colicky bilateral pelvic pain.  .Review of Systems  Constitutional: Negative for fever, malaise/fatigue and weight loss.  HENT: Negative.  Negative for nosebleeds.   Eyes: Negative.  Negative for blurred vision, double vision and photophobia.  Respiratory: Negative for cough and shortness of breath.        States finds it difficult at times to "take a cleansing breath"  Cardiovascular: Negative.  Negative for chest pain, palpitations and leg swelling.  Gastrointestinal: Negative.  Negative for heartburn, nausea and vomiting.  Genitourinary:       SEE HPI  Musculoskeletal: Negative.   Negative for myalgias.  Neurological: Negative.  Negative for dizziness, focal weakness, seizures and headaches.  Psychiatric/Behavioral: Negative.  Negative for suicidal ideas.    Past Medical History:  Diagnosis Date  . Anemia Dx January 03 2015  . Diabetes mellitus without complication ([place])   . Elevated liver enzymes   . Essential hypertension Dx Apr 2016  . H/O: upper GI bleed   . Liver cirrhosis secondary to NASH ([place])   . Reflux     Past Surgical History:  Procedure Laterality Date  . ESOPHAGEAL BANDING  03/22/2019   Procedure: ESOPHAGEAL BANDING;  Surgeon: Wilford Corner, MD;  [facility]: [facility];  Service: Endoscopy;;  . ESOPHAGEAL BANDING  03/23/2019   Procedure: ESOPHAGEAL BANDING;  Surgeon: Wilford Corner, MD;  [facility];  Service: Endoscopy;;  . ESOPHAGOGASTRODUODENOSCOPY N/A 03/22/2019   Procedure: ESOPHAGOGASTRODUODENOSCOPY (EGD);  Surgeon: Wilford Corner, MD;  Location: [facility];  Service: Endoscopy;  Laterality: N/A;  . ESOPHAGOGASTRODUODENOSCOPY N/A 03/23/2019   Procedure: ESOPHAGOGASTRODUODENOSCOPY (EGD);  Surgeon: Wilford Corner, MD;  Location: [facility];  Service: Endoscopy;  Laterality: N/A;  . TUBAL LIGATION  Jul 25, 2000    Family History  Problem Relation Age of Onset  . COPD Mother   . Esophageal cancer Mother   . Diabetes Mother   . Cancer Mother   . Hypertension Mother   . Diabetes Father     Social History Reviewed with no changes to be made today.   Outpatient Medications Prior to Visit  Medication Sig Dispense Refill  . blood glucose meter kit and supplies KIT Dispense based on patient  and insurance preference. Use up to four times daily as directed. (FOR ICD-9 250.00, 250.01). Dispense Relion 1 each 0  . cyclobenzaprine (FLEXERIL) 10 MG tablet Take 1 tablet (10 mg total) by mouth 3 (three) times daily as needed for muscle spasms. 30 tablet 0  . ferrous sulfate 325 (65 FE) MG tablet Take 1 tablet (325 mg total)  by mouth 2 (two) times daily with a meal. 60 tablet 0  . glucose blood (CONTOUR NEXT TEST) test strip Use up to four times daily as directed. (FOR ICD-9 250.00, 250.01). 100 each 11  . losartan (COZAAR) 100 MG tablet Take 1 tablet (100 mg total) by mouth daily. 90 tablet 1  . metFORMIN (GLUCOPHAGE) 1000 MG tablet TAKE 1 TABLET BY MOUTH TWICE DAILY WITH A MEAL 180 tablet 0  . Microlet Lancets MISC Use up to four times daily as directed. (FOR ICD-9 250.00, 250.01). 100 each 11  . pantoprazole (PROTONIX) 40 MG tablet Take 1 tablet (40 mg total) by mouth daily. 90 tablet 1  . propranolol (INDERAL) 20 MG tablet Take 1 tablet (20 mg total) by mouth 2 (two) times daily. 60 tablet 3  . Insulin Pen Needle 31G X 5 MM MISC 1 Container by Does not apply route 2 (two) times a day. 100 each 0  . Insulin Detemir (LEVEMIR FLEXTOUCH) 100 UNIT/ML Pen Inject 6 Units into the skin daily. (Patient not taking: Reported on 06/14/2019) 15 mL 0   No facility-administered medications prior to visit.     Allergies  Allergen Reactions  . Nsaids     GI BLEED       Objective:    BP (!) 149/81 (BP Location: Left Arm, Patient Position: Sitting, Cuff Size: Normal)   Pulse 70   Temp 99.2 F (37.3 C) (Oral)   Ht 5' 4.5" (1.638 m)   Wt 227 lb (103 kg)   LMP 06/28/2019   SpO2 99%   BMI 38.36 kg/m  Wt Readings from Last 3 Encounters:  07/13/19 227 lb (103 kg)  07/08/19 223 lb (101.2 kg)  06/03/19 230 lb 4.8 oz (104.5 kg)    Physical Exam Vitals signs and nursing note reviewed.  Constitutional:      Appearance: She is well-developed.  HENT:     Head: Normocephalic and atraumatic.  Neck:     Musculoskeletal: Normal range of motion.  Cardiovascular:     Rate and Rhythm: Normal rate and regular rhythm.     Heart sounds: Normal heart sounds. No murmur. No friction rub. No gallop.   Pulmonary:     Effort: Pulmonary effort is normal. No tachypnea or respiratory distress.     Breath sounds: Normal breath sounds.  No decreased breath sounds, wheezing, rhonchi or rales.  Chest:     Chest wall: No tenderness.  Abdominal:     General: Bowel sounds are normal.     Palpations: Abdomen is soft.  Musculoskeletal: Normal range of motion.  Skin:    General: Skin is warm and dry.  Neurological:     Mental Status: She is alert and oriented to person, place, and time.     Coordination: Coordination normal.  Psychiatric:        Behavior: Behavior normal. Behavior is cooperative.        Thought Content: Thought content normal.        Judgment: Judgment normal.          Patient has been counseled extensively about nutrition and exercise as well as  the importance of adherence with medications and regular follow-up. The patient was given clear instructions to go to ER or return to medical center if symptoms don't improve, worsen or new problems develop. The patient verbalized understanding.   Follow-up: Return for PAP SMEAR.   Gildardo Pounds, FNP-BC [facility] [place], [place]   07/13/2019, 3:03 PM

## 2019-07-14 ENCOUNTER — Encounter: Payer: Self-pay | Admitting: Nurse Practitioner

## 2019-07-14 LAB — CMP14+EGFR
ALT: 38 IU/L — ABNORMAL HIGH (ref 0–32)
AST: 31 IU/L (ref 0–40)
Albumin/Globulin Ratio: 1.1 — ABNORMAL LOW (ref 1.2–2.2)
Albumin: 4.3 g/dL (ref 3.8–4.8)
Alkaline Phosphatase: 133 IU/L — ABNORMAL HIGH (ref 39–117)
BUN/Creatinine Ratio: 28 — ABNORMAL HIGH (ref 9–23)
BUN: 15 mg/dL (ref 6–24)
Bilirubin Total: 0.4 mg/dL (ref 0.0–1.2)
CO2: 26 mmol/L (ref 20–29)
Calcium: 9.6 mg/dL (ref 8.7–10.2)
Chloride: 102 mmol/L (ref 96–106)
Creatinine, Ser: 0.53 mg/dL — ABNORMAL LOW (ref 0.57–1.00)
GFR calc Af Amer: 135 mL/min/{1.73_m2} (ref >59)
GFR calc non Af Amer: 117 mL/min/{1.73_m2} (ref >59)
Globulin, Total: 3.9 g/dL (ref 1.5–4.5)
Glucose: 96 mg/dL (ref 65–99)
Potassium: 3.9 mmol/L (ref 3.5–5.2)
Sodium: 141 mmol/L (ref 134–144)
Total Protein: 8.2 g/dL (ref 6.0–8.5)

## 2019-07-14 NOTE — Telephone Encounter (Signed)
Please advise on timing

## 2019-07-15 ENCOUNTER — Other Ambulatory Visit: Payer: Self-pay | Admitting: Gastroenterology

## 2019-07-15 NOTE — Telephone Encounter (Signed)
CMA spoke to patient. Patient stated she will keep her appt. Time and it should not affect her eye appt. Time.

## 2019-07-19 NOTE — Progress Notes (Signed)
[place] NOTE  Patient Care Team: Gildardo Pounds, NP as PCP - General (Nurse Practitioner)  CHIEF COMPLAINTS/PURPOSE OF CONSULTATION:  Newly diagnosed thrombocytopenia  HISTORY OF PRESENTING ILLNESS:  Angela Castillo 43 y.o. female is here because of recent diagnosis of thrombocytopenia. She is referred by her PCP, Archie Patten, NP. Labs on 06/25/19 showed: Hg 12.2, HCT 39.8, platelets 75. She has a history of suspected cirrhosis secondary to NASH and was admitted for a GI bleed from 5/17-5/25 during which she received 4 units of PRBCs. She presents to the clinic today for initial evaluation and discussion of treatment options.  Patient is planning to get the upper endoscopy in the next few days.  I reviewed her records extensively and collaborated the history with the patient.  MEDICAL HISTORY:  Past Medical History:  Diagnosis Date  . Anemia Dx January 03 2015  . Diabetes mellitus without complication ([place])   . Elevated liver enzymes   . Essential hypertension Dx Apr 2016  . H/O: upper GI bleed   . Liver cirrhosis secondary to NASH ([place])   . Reflux     SURGICAL HISTORY: Past Surgical History:  Procedure Laterality Date  . ESOPHAGEAL BANDING  03/22/2019   Procedure: ESOPHAGEAL BANDING;  Surgeon: Wilford Corner, MD;  Location: [facility];  Service: Endoscopy;;  . ESOPHAGEAL BANDING  03/23/2019   Procedure: ESOPHAGEAL BANDING;  Surgeon: Wilford Corner, MD;  Location: [facility];  Service: Endoscopy;;  . ESOPHAGOGASTRODUODENOSCOPY N/A 03/22/2019   Procedure: ESOPHAGOGASTRODUODENOSCOPY (EGD);  Surgeon: Wilford Corner, MD;  Location: [facility];  Service: Endoscopy;  Laterality: N/A;  . ESOPHAGOGASTRODUODENOSCOPY N/A 03/23/2019   Procedure: ESOPHAGOGASTRODUODENOSCOPY (EGD);  Surgeon: Wilford Corner, MD;  Location: [facility];  Service: Endoscopy;  Laterality: N/A;  . TUBAL LIGATION  Jul 25, 2000    SOCIAL HISTORY: Social History   Socioeconomic History  . Marital status: Married    Spouse name: Not on file  . Number of children: Not on file  . Years of education: Not on file  . Highest education level: Not on file  Occupational History  . Not on file  Social Needs  . Financial resource strain: Not on file  . Food insecurity    Worry: Not on file    Inability: Not on file  . Transportation needs    Medical: Not on file    Non-medical: Not on file  Tobacco Use  . Smoking status: Never Smoker  . Smokeless tobacco: Never Used  Substance and Sexual Activity  . Alcohol use: Yes    Comment: rarely-last alcohol drank in 08/2018  . Drug use: No  . Sexual activity: Yes  Lifestyle  . Physical activity    Days per week: Not on file    Minutes per session: Not on file  . Stress: Not on file  Relationships  . Social Herbalist on phone: Not on file    Gets together: Not on file    Attends religious service: Not on file    Active member of club or organization: Not on file    Attends meetings of clubs or organizations: Not on file    Relationship status: Not on file  . Intimate partner violence    Fear of current or ex partner: Not on file    Emotionally abused: Not on file    Physically abused: Not on file    Forced sexual activity: Not on file  Other Topics Concern  . Not  on file  Social History Narrative  . Not on file    FAMILY HISTORY: Family History  Problem Relation Age of Onset  . COPD Mother   . Esophageal cancer Mother   . Diabetes Mother   . Cancer Mother   . Hypertension Mother   . Diabetes Father     ALLERGIES:  is allergic to nsaids.  MEDICATIONS:  Current Outpatient Medications  Medication Sig Dispense Refill  . acetaminophen (TYLENOL) 650 MG CR tablet Take 1,300 mg by mouth daily as needed for pain.    Marland Kitchen amLODipine (NORVASC) 5 MG tablet Take 1 tablet (5 mg total) by mouth daily. 90 tablet 3  . blood glucose meter kit and supplies KIT Dispense based on patient and  insurance preference. Use up to four times daily as directed. (FOR ICD-9 250.00, 250.01). Dispense Relion 1 each 0  . cyclobenzaprine (FLEXERIL) 10 MG tablet Take 1 tablet (10 mg total) by mouth 3 (three) times daily as needed for muscle spasms. 30 tablet 0  . ferrous sulfate 325 (65 FE) MG tablet Take 1 tablet (325 mg total) by mouth 2 (two) times daily with a meal. 60 tablet 0  . fluticasone (FLONASE) 50 MCG/ACT nasal spray Place 1 spray into both nostrils daily.    Marland Kitchen glucose blood (CONTOUR NEXT TEST) test strip Use up to four times daily as directed. (FOR ICD-9 250.00, 250.01). 100 each 11  . losartan (COZAAR) 100 MG tablet Take 1 tablet (100 mg total) by mouth daily. 90 tablet 1  . metFORMIN (GLUCOPHAGE) 1000 MG tablet TAKE 1 TABLET BY MOUTH TWICE DAILY WITH A MEAL (Patient taking differently: Take 1,000 mg by mouth 2 (two) times daily with a meal. ) 180 tablet 0  . Microlet Lancets MISC Use up to four times daily as directed. (FOR ICD-9 250.00, 250.01). 100 each 11  . pantoprazole (PROTONIX) 40 MG tablet Take 1 tablet (40 mg total) by mouth daily. 90 tablet 1  . propranolol (INDERAL) 20 MG tablet Take 1 tablet (20 mg total) by mouth 2 (two) times daily. 60 tablet 3   No current facility-administered medications for this visit.     REVIEW OF SYSTEMS:   Constitutional: Denies fevers, chills or abnormal night sweats Eyes: Denies blurriness of vision, double vision or watery eyes Ears, nose, mouth, throat, and face: Denies mucositis or sore throat Respiratory: Denies cough, dyspnea or wheezes Cardiovascular: Denies palpitation, chest discomfort or lower extremity swelling Gastrointestinal:  Denies nausea, heartburn or change in bowel habits Skin: Denies abnormal skin rashes Lymphatics: Denies new lymphadenopathy or easy bruising Neurological:Denies numbness, tingling or new weaknesses Behavioral/Psych: Mood is stable, no new changes  Breast: Denies any palpable lumps or discharge All  other systems were reviewed with the patient and are negative.  PHYSICAL EXAMINATION: ECOG PERFORMANCE STATUS: 1 - Symptomatic but completely ambulatory  There were no vitals filed for this visit. There were no vitals filed for this visit.  GENERAL:alert, no distress and comfortable SKIN: skin color, texture, turgor are normal, no rashes or significant lesions EYES: normal, conjunctiva are pink and non-injected, sclera clear OROPHARYNX:no exudate, no erythema and lips, buccal mucosa, and tongue normal  NECK: supple, thyroid normal size, non-tender, without nodularity LYMPH:  no palpable lymphadenopathy in the cervical, axillary or inguinal LUNGS: clear to auscultation and percussion with normal breathing effort HEART: regular rate & rhythm and no murmurs and no lower extremity edema ABDOMEN:abdomen soft, non-tender and normal bowel sounds Musculoskeletal:no cyanosis of digits and no clubbing  PSYCH: alert & oriented x 3 with fluent speech NEURO: no focal motor/sensory deficits  LABORATORY DATA:  I have reviewed the data as listed Lab Results  Component Value Date   WBC 7.0 06/25/2019   HGB 12.2 06/25/2019   HCT 39.8 06/25/2019   MCV 83 06/25/2019   PLT 75 (LL) 06/25/2019   Lab Results  Component Value Date   NA 141 07/13/2019   K 3.9 07/13/2019   CL 102 07/13/2019   CO2 26 07/13/2019    RADIOGRAPHIC STUDIES: I have personally reviewed the radiological reports and agreed with the findings in the report.  ASSESSMENT AND PLAN:  Thrombocytopenia ([place]) Originally started August 2015: Platelet count 130 01/03/2015: Hemoglobin 6.4: Required blood transfusion and was found to be iron deficient 05/01/2017: Platelet count relatively stable 147 September 2019: Platelet count 110 03/21/2019: Platelet count 94 during hospitalization for cirrhosis and gastric variceal bleeding status post banding complicated by aspiration pneumonia 03/29/2019 at the time of discharge platelet count 104   06/25/2019: Platelet count 74 Differential diagnosis: Cirrhosis and splenomegaly related thrombocytopenia versus ITP Previous work-up has been negative hepatitis B, hepatitis C, HIV  Patient has chronic mild thrombocytopenia around 100-130 range probably due to cirrhosis.  Recent exacerbation probably because of GI bleeding and increased consumption.  There is no indication to treat at this time.  This can be watched and monitored. Plan: Repeat CBC with differential, immature platelet fraction to determine if there is decreased production or increased consumption.   All questions were answered. The patient knows to call the clinic with any problems, questions or concerns.   Rulon Eisenmenger, MD 07/20/2019    I, Molly Dorshimer, am acting as scribe for Nicholas Lose, MD.  I have reviewed the above documentation for accuracy and completeness, and I agree with the above.

## 2019-07-20 ENCOUNTER — Inpatient Hospital Stay: Payer: BC Managed Care – PPO

## 2019-07-20 ENCOUNTER — Other Ambulatory Visit: Payer: Self-pay

## 2019-07-20 ENCOUNTER — Encounter (HOSPITAL_COMMUNITY): Payer: Self-pay | Admitting: *Deleted

## 2019-07-20 ENCOUNTER — Inpatient Hospital Stay: Payer: BC Managed Care – PPO | Attending: Hematology and Oncology | Admitting: Hematology and Oncology

## 2019-07-20 ENCOUNTER — Other Ambulatory Visit (HOSPITAL_COMMUNITY)
Admission: RE | Admit: 2019-07-20 | Discharge: 2019-07-20 | Disposition: A | Discharge status: Home / Self Care | Payer: BC Managed Care – PPO | Source: Ambulatory Visit | Attending: Gastroenterology | Admitting: Gastroenterology

## 2019-07-20 DIAGNOSIS — Z20828 Contact with and (suspected) exposure to other viral communicable diseases: Secondary | ICD-10-CM | POA: Diagnosis not present

## 2019-07-20 DIAGNOSIS — Z809 Family history of malignant neoplasm, unspecified: Secondary | ICD-10-CM | POA: Insufficient documentation

## 2019-07-20 DIAGNOSIS — D696 Thrombocytopenia, unspecified: Secondary | ICD-10-CM

## 2019-07-20 DIAGNOSIS — Z825 Family history of asthma and other chronic lower respiratory diseases: Secondary | ICD-10-CM | POA: Insufficient documentation

## 2019-07-20 DIAGNOSIS — Z886 Allergy status to analgesic agent status: Secondary | ICD-10-CM | POA: Diagnosis not present

## 2019-07-20 DIAGNOSIS — Z79899 Other long term (current) drug therapy: Secondary | ICD-10-CM | POA: Insufficient documentation

## 2019-07-20 DIAGNOSIS — Z833 Family history of diabetes mellitus: Secondary | ICD-10-CM | POA: Insufficient documentation

## 2019-07-20 DIAGNOSIS — E119 Type 2 diabetes mellitus without complications: Secondary | ICD-10-CM | POA: Insufficient documentation

## 2019-07-20 DIAGNOSIS — I10 Essential (primary) hypertension: Secondary | ICD-10-CM | POA: Diagnosis not present

## 2019-07-20 DIAGNOSIS — K7460 Unspecified cirrhosis of liver: Secondary | ICD-10-CM | POA: Insufficient documentation

## 2019-07-20 DIAGNOSIS — K7581 Nonalcoholic steatohepatitis (NASH): Secondary | ICD-10-CM | POA: Insufficient documentation

## 2019-07-20 DIAGNOSIS — Z01812 Encounter for preprocedural laboratory examination: Secondary | ICD-10-CM | POA: Insufficient documentation

## 2019-07-20 DIAGNOSIS — Z8249 Family history of ischemic heart disease and other diseases of the circulatory system: Secondary | ICD-10-CM | POA: Diagnosis not present

## 2019-07-20 DIAGNOSIS — Z7984 Long term (current) use of oral hypoglycemic drugs: Secondary | ICD-10-CM

## 2019-07-20 LAB — CBC WITH DIFFERENTIAL (CANCER CENTER ONLY)
Abs Immature Granulocytes: 0.01 10*3/uL (ref 0.00–0.07)
Basophils Absolute: 0 10*3/uL (ref 0.0–0.1)
Basophils Relative: 0 %
Eosinophils Absolute: 0.1 10*3/uL (ref 0.0–0.5)
Eosinophils Relative: 2 %
HCT: 39.7 % (ref 36.0–46.0)
Hemoglobin: 12.2 g/dL (ref 12.0–15.0)
Immature Granulocytes: 0 %
Lymphocytes Relative: 31 %
Lymphs Abs: 2.3 10*3/uL (ref 0.7–4.0)
MCH: 25.1 pg — ABNORMAL LOW (ref 26.0–34.0)
MCHC: 30.7 g/dL (ref 30.0–36.0)
MCV: 81.5 fL (ref 80.0–100.0)
Monocytes Absolute: 0.4 10*3/uL (ref 0.1–1.0)
Monocytes Relative: 5 %
Neutro Abs: 4.7 10*3/uL (ref 1.7–7.7)
Neutrophils Relative %: 62 %
Platelet Count: 112 10*3/uL — ABNORMAL LOW (ref 150–400)
RBC: 4.87 MIL/uL (ref 3.87–5.11)
RDW: 16 % — ABNORMAL HIGH (ref 11.5–15.5)
WBC Count: 7.6 10*3/uL (ref 4.0–10.5)
nRBC: 0 % (ref 0.0–0.2)

## 2019-07-20 LAB — IMMATURE PLATELET FRACTION: Immature Platelet Fraction: 6.5 % (ref 1.2–8.6)

## 2019-07-20 LAB — PLATELET BY CITRATE

## 2019-07-20 NOTE — Assessment & Plan Note (Addendum)
Originally started August 2015: Platelet count 130 01/03/2015: Hemoglobin 6.4: Required blood transfusion and was found to be iron deficient 05/01/2017: Platelet count relatively stable 147 September 2019: Platelet count 110 03/21/2019: Platelet count 94 during hospitalization for cirrhosis and gastric variceal bleeding status post banding complicated by aspiration pneumonia 03/29/2019 at the time of discharge platelet count 104  06/25/2019: Platelet count 74 Differential diagnosis: Cirrhosis and splenomegaly related thrombocytopenia versus ITP Plan: Repeat CBC today. Patient has chronic mild thrombocytopenia around 100-130 range probably due to cirrhosis.  Recent exacerbation probably because of GI bleeding and increased consumption.  There is no indication to treat at this time.  This can be watched and monitored.

## 2019-07-21 ENCOUNTER — Encounter: Payer: Self-pay | Admitting: Hematology and Oncology

## 2019-07-21 LAB — NOVEL CORONAVIRUS, NAA (HOSP ORDER, SEND-OUT TO REF LAB; TAT 18-24 HRS): SARS-CoV-2, NAA: NOT DETECTED

## 2019-07-22 ENCOUNTER — Telehealth: Payer: Self-pay | Admitting: Hematology and Oncology

## 2019-07-22 NOTE — Telephone Encounter (Signed)
I informed her that the platelet count is 112. This is her baseline level and therefore nothing further needs to be done. She will be seen on an as-needed basis.

## 2019-07-23 ENCOUNTER — Ambulatory Visit (HOSPITAL_COMMUNITY)
Admission: RE | Admit: 2019-07-23 | Discharge: 2019-07-23 | Disposition: A | Discharge status: Home / Self Care | Payer: BC Managed Care – PPO | Attending: Gastroenterology | Admitting: Gastroenterology

## 2019-07-23 ENCOUNTER — Ambulatory Visit (HOSPITAL_COMMUNITY): Payer: BC Managed Care – PPO

## 2019-07-23 ENCOUNTER — Encounter (HOSPITAL_COMMUNITY)
Admission: RE | Disposition: A | Discharge status: Home / Self Care | Payer: Self-pay | Source: Home / Self Care | Attending: Gastroenterology

## 2019-07-23 ENCOUNTER — Encounter (HOSPITAL_COMMUNITY): Payer: Self-pay | Admitting: Gastroenterology

## 2019-07-23 ENCOUNTER — Other Ambulatory Visit: Payer: Self-pay

## 2019-07-23 DIAGNOSIS — D509 Iron deficiency anemia, unspecified: Secondary | ICD-10-CM | POA: Diagnosis not present

## 2019-07-23 DIAGNOSIS — I10 Essential (primary) hypertension: Secondary | ICD-10-CM | POA: Diagnosis not present

## 2019-07-23 DIAGNOSIS — Z79899 Other long term (current) drug therapy: Secondary | ICD-10-CM | POA: Insufficient documentation

## 2019-07-23 DIAGNOSIS — K766 Portal hypertension: Secondary | ICD-10-CM | POA: Insufficient documentation

## 2019-07-23 DIAGNOSIS — K7460 Unspecified cirrhosis of liver: Secondary | ICD-10-CM | POA: Diagnosis not present

## 2019-07-23 DIAGNOSIS — I8510 Secondary esophageal varices without bleeding: Secondary | ICD-10-CM | POA: Insufficient documentation

## 2019-07-23 DIAGNOSIS — K3189 Other diseases of stomach and duodenum: Secondary | ICD-10-CM | POA: Diagnosis not present

## 2019-07-23 DIAGNOSIS — K219 Gastro-esophageal reflux disease without esophagitis: Secondary | ICD-10-CM | POA: Diagnosis not present

## 2019-07-23 DIAGNOSIS — E119 Type 2 diabetes mellitus without complications: Secondary | ICD-10-CM | POA: Diagnosis not present

## 2019-07-23 DIAGNOSIS — Z794 Long term (current) use of insulin: Secondary | ICD-10-CM | POA: Insufficient documentation

## 2019-07-23 DIAGNOSIS — Z6837 Body mass index (BMI) 37.0-37.9, adult: Secondary | ICD-10-CM | POA: Insufficient documentation

## 2019-07-23 DIAGNOSIS — I8500 Esophageal varices without bleeding: Secondary | ICD-10-CM | POA: Diagnosis not present

## 2019-07-23 HISTORY — PX: ESOPHAGOGASTRODUODENOSCOPY (EGD) WITH PROPOFOL: SHX5813

## 2019-07-23 LAB — GLUCOSE, CAPILLARY: Glucose-Capillary: 82 mg/dL (ref 70–99)

## 2019-07-23 SURGERY — ESOPHAGOGASTRODUODENOSCOPY (EGD) WITH PROPOFOL
Anesthesia: Monitor Anesthesia Care

## 2019-07-23 MED ORDER — PROPOFOL 10 MG/ML IV BOLUS
INTRAVENOUS | Status: AC
  Filled 2019-07-23: qty 20

## 2019-07-23 MED ORDER — SODIUM CHLORIDE 0.9 % IV SOLN: INTRAVENOUS | Status: DC

## 2019-07-23 MED ORDER — PROPOFOL 10 MG/ML IV BOLUS
INTRAVENOUS | Status: AC
  Filled 2019-07-23: qty 40

## 2019-07-23 MED ORDER — PROPOFOL 10 MG/ML IV BOLUS
INTRAVENOUS | Status: DC | PRN
  Administered 2019-07-23: 10 mg via INTRAVENOUS
  Administered 2019-07-23: 20 mg via INTRAVENOUS
  Administered 2019-07-23: 10 mg via INTRAVENOUS

## 2019-07-23 MED ORDER — PROPOFOL 500 MG/50ML IV EMUL
INTRAVENOUS | Status: DC | PRN
  Administered 2019-07-23: 150 ug/kg/min via INTRAVENOUS

## 2019-07-23 MED ORDER — ONDANSETRON HCL 4 MG/2ML IJ SOLN
INTRAMUSCULAR | Status: DC | PRN
  Administered 2019-07-23: 4 mg via INTRAVENOUS

## 2019-07-23 MED ORDER — LACTATED RINGERS IV SOLN
INTRAVENOUS | Status: DC
  Administered 2019-07-23: 09:00:00 1000 mL via INTRAVENOUS
  Administered 2019-07-23: 10:00:00 via INTRAVENOUS

## 2019-07-23 MED ORDER — LIDOCAINE 2% (20 MG/ML) 5 ML SYRINGE
INTRAMUSCULAR | Status: DC | PRN
  Administered 2019-07-23: 60 mg via INTRAVENOUS

## 2019-07-23 SURGICAL SUPPLY — 14 items

## 2019-07-23 NOTE — Discharge Instructions (Signed)

## 2019-07-23 NOTE — Transfer of Care (Signed)
Immediate Anesthesia Transfer of Care Note  Patient: Angela Castillo  Procedure(s) Performed: ESOPHAGOGASTRODUODENOSCOPY (EGD) WITH PROPOFOL with possible banding (N/A )  Patient Location: PACU  Anesthesia Type:MAC  Level of Consciousness: awake, alert  and oriented  Airway & Oxygen Therapy: Patient Spontanous Breathing and Patient connected to face mask oxygen (POM mask)  Post-op Assessment: Report given to RN and Post -op Vital signs reviewed and stable  Post vital signs: Reviewed and stable  Last Vitals:  Vitals Value Taken Time  BP    Temp    Pulse 97 07/23/19 1026  Resp 20 07/23/19 1026  SpO2 99 % 07/23/19 1026  Vitals shown include unvalidated device data.  Last Pain:  Vitals:   07/23/19 0801  PainSc: 0-No pain         Complications: No apparent anesthesia complications

## 2019-07-23 NOTE — Anesthesia Preprocedure Evaluation (Signed)
Anesthesia Evaluation  Patient identified by MRN, date of birth, ID band Patient awake    Reviewed: Allergy & Precautions, NPO status , Patient's Chart, lab work & pertinent test results  History of Anesthesia Complications Negative for: history of anesthetic complications  Airway Mallampati: II  TM Distance: >3 FB Neck ROM: Full    Dental  (+) Dental Advisory Given   Pulmonary neg pulmonary ROS, neg recent URI,    breath sounds clear to auscultation       Cardiovascular hypertension, Pt. on medications and Pt. on home beta blockers  Rhythm:Regular     Neuro/Psych negative neurological ROS  negative psych ROS   GI/Hepatic GERD  Medicated and Controlled,(+) Cirrhosis       , Hepatitis -  Endo/Other  diabetesMorbid obesity  Renal/GU      Musculoskeletal   Abdominal   Peds  Hematology  (+) anemia ,   Anesthesia Other Findings   Reproductive/Obstetrics                             Anesthesia Physical Anesthesia Plan  ASA: II  Anesthesia Plan: MAC   Post-op Pain Management:    Induction: Intravenous  PONV Risk Score and Plan: 2 and Propofol infusion and Treatment may vary due to age or medical condition  Airway Management Planned: Nasal Cannula  Additional Equipment: None  Intra-op Plan:   Post-operative Plan:   Informed Consent: I have reviewed the patients History and Physical, chart, labs and discussed the procedure including the risks, benefits and alternatives for the proposed anesthesia with the patient or authorized representative who has indicated his/her understanding and acceptance.     Dental advisory given  Plan Discussed with: CRNA and Surgeon  Anesthesia Plan Comments:         Anesthesia Quick Evaluation

## 2019-07-23 NOTE — H&P (Signed)
Date of Initial H&P: 07/15/19  History reviewed, patient examined, no change in status, stable for surgery.

## 2019-07-23 NOTE — Anesthesia Procedure Notes (Signed)
Procedure Name: MAC Date/Time: 07/23/2019 9:41 AM Performed by: Maxwell Caul, CRNA Pre-anesthesia Checklist: Patient identified, Emergency Drugs available, Suction available, Patient being monitored and Timeout performed Oxygen Delivery Method: Simple face mask Comments: POM mask utilized.

## 2019-07-23 NOTE — Op Note (Signed)
[facility] Patient Name: Angela Castillo Procedure Date: 07/23/2019 MRN: [id number] Attending MD: [name] , MD Date of Birth: 10/06/1976 CSN: [id number] Age: 43 Admit Type: Outpatient Procedure:                Upper GI endoscopy Indications:              2nd degree variceal surveillance (following bleed                            and to complete eradication) Providers:                [name], MD, Merlinda Frederick, Technician Referring MD:              Medicines:                Propofol per Anesthesia, Monitored Anesthesia Care Complications:            No immediate complications. Estimated Blood Loss:     Estimated blood loss: none. Procedure:                Pre-Anesthesia Assessment:                           - Prior to the procedure, a History and Physical                            was performed, and patient medications and                            allergies were reviewed. The patient's tolerance of                            previous anesthesia was also reviewed. The risks                            and benefits of the procedure and the sedation                            options and risks were discussed with the patient.                            All questions were answered, and informed consent                            was obtained. Prior Anticoagulants: The patient has                            taken no previous anticoagulant or antiplatelet                            agents. ASA Grade Assessment: III - A patient with  severe systemic disease. After reviewing the risks                            and benefits, the patient was deemed in                            satisfactory condition to undergo the procedure.                           After obtaining informed consent, the endoscope was                            passed under direct vision. Throughout the                      procedure, the patient's blood pressure, pulse, and                            oxygen saturations were monitored continuously. The                            [id number]-H190 ([id number]) Olympus gastroscope was                            introduced through the mouth, and advanced to the                            second part of duodenum. The upper GI endoscopy was                            accomplished without difficulty. The patient                            tolerated the procedure well. Scope In: Scope Out: Findings:      Grade II varices were found in the distal esophagus. Seven bands were       successfully placed with incomplete eradication of varices. There was no       bleeding during and at the end of the procedure.      The Z-line was regular and was found 40 cm from the incisors.      Moderate portal hypertensive gastropathy was found in the gastric fundus       and in the gastric body.      The cardia and gastric fundus were normal on retroflexion.      The examined duodenum was normal. Impression:               - Grade II esophageal varices. Incompletely                            eradicated. Banded.                           - Z-line regular, 40 cm from the incisors.                           - Portal hypertensive  gastropathy.                           - Normal examined duodenum.                           - No specimens collected. Moderate Sedation:      Not Applicable - Patient had care per Anesthesia. Recommendation:           - Patient has a contact number available for                            emergencies. The signs and symptoms of potential                            delayed complications were discussed with the                            patient. Return to normal activities tomorrow.                            Written discharge instructions were provided to the                            patient.                           - Low sodium diet. Procedure  Code(s):        --- Professional ---                           [phone], Esophagogastroduodenoscopy, flexible,                            transoral; with band ligation of esophageal/gastric                            varices Diagnosis Code(s):        --- Professional ---                           I85.00, Esophageal varices without bleeding                           K76.6, Portal hypertension                           K31.89, Other diseases of stomach and duodenum CPT copyright 2019 American Medical Association. All rights reserved. The codes documented in this report are preliminary and upon coder review may  be revised to meet current compliance requirements. [name], MD 07/23/2019 10:32:45 AM This report has been signed electronically. Number of Addenda: 0

## 2019-07-23 NOTE — Interval H&P Note (Signed)
History and Physical Interval Note:  07/23/2019 9:24 AM  Angela Castillo  has presented today for surgery, with the diagnosis of hepatic cirrhosis.  The various methods of treatment have been discussed with the patient and family. After consideration of risks, benefits and other options for treatment, the patient has consented to  Procedure(s): ESOPHAGOGASTRODUODENOSCOPY (EGD) WITH PROPOFOL with possible banding (N/A) as a surgical intervention.  The patient's history has been reviewed, patient examined, no change in status, stable for surgery.  I have reviewed the patient's chart and labs.  Questions were answered to the patient's satisfaction.     [name]

## 2019-07-24 ENCOUNTER — Encounter: Payer: Self-pay | Admitting: Nurse Practitioner

## 2019-07-26 ENCOUNTER — Encounter: Payer: Self-pay | Admitting: Nurse Practitioner

## 2019-07-26 NOTE — Anesthesia Postprocedure Evaluation (Signed)
Anesthesia Post Note  Patient: Angela Castillo  Procedure(s) Performed: ESOPHAGOGASTRODUODENOSCOPY (EGD) WITH PROPOFOL with possible banding (N/A )     Patient location during evaluation: Endoscopy Anesthesia Type: MAC Level of consciousness: awake and alert Pain management: pain level controlled Vital Signs Assessment: post-procedure vital signs reviewed and stable Respiratory status: spontaneous breathing, nonlabored ventilation, respiratory function stable and patient connected to nasal cannula oxygen Cardiovascular status: stable and blood pressure returned to baseline Postop Assessment: no apparent nausea or vomiting Anesthetic complications: no    Last Vitals:  Vitals:   07/23/19 1040 07/23/19 1050  BP: 133/74 (!) 157/105  Pulse: 85   Resp: 17   Temp:    SpO2: 100%     Last Pain:  Vitals:   07/23/19 1028  TempSrc: Oral  PainSc: 2                  Malloree Raboin

## 2019-07-27 ENCOUNTER — Encounter: Payer: Self-pay | Admitting: Pharmacist

## 2019-07-27 ENCOUNTER — Ambulatory Visit: Payer: BC Managed Care – PPO | Attending: Family Medicine | Admitting: Pharmacist

## 2019-07-27 ENCOUNTER — Other Ambulatory Visit: Payer: Self-pay

## 2019-07-27 VITALS — BP 114/71 | HR 79

## 2019-07-27 DIAGNOSIS — I10 Essential (primary) hypertension: Secondary | ICD-10-CM

## 2019-07-27 NOTE — Progress Notes (Signed)
   S:    PCP: Zelda   Patient arrives in good spirits. Presents to the clinic for hypertension evaluation, counseling, and management.  Patient was referred and last seen by Primary Care Provider on 07/13/19. BP was above goal so Zelda added amlodipine.   Patient reports adherence with medications.  Current BP Medications include:  Amlodipine 5 mg daily, losartan 100 mg daily, propranolol 20 mg BID  Dietary habits include: no changes  Exercise habits include: none outside of work Family / Social history:  -  DM (mother, father), HTN, (mother) - Tobacco: never smoker - Alcohol: rare   O:  Physical Exam   ROS  Home BP readings: not able to check  Last 3 Office BP readings: BP Readings from Last 3 Encounters:  07/23/19 (!) 157/105  07/20/19 133/75  07/13/19 (!) 137/91   BMET    Component Value Date/Time   NA 141 07/13/2019 1540   K 3.9 07/13/2019 1540   CL 102 07/13/2019 1540   CO2 26 07/13/2019 1540   GLUCOSE 96 07/13/2019 1540   GLUCOSE 192 (H) 03/29/2019 0450   BUN 15 07/13/2019 1540   CREATININE 0.53 (L) 07/13/2019 1540   CREATININE 0.68 06/21/2016 1713   CALCIUM 9.6 07/13/2019 1540   GFRNONAA 117 07/13/2019 1540   GFRNONAA >89 06/21/2016 1713   GFRAA 135 07/13/2019 1540   GFRAA >89 06/21/2016 1713    Renal function: Estimated Creatinine Clearance: 106.4 mL/min (A) (by C-G formula based on SCr of 0.53 mg/dL (L)).  Clinical ASCVD: No  The 10-year ASCVD risk score Mikey Bussing DC Jr., et al., 2013) is: 4.1%   Values used to calculate the score:     Age: 43 years     Sex: Female     Is Non-Hispanic African American: No     Diabetic: Yes     Tobacco smoker: No     Systolic Blood Pressure: 867 mmHg     Is BP treated: Yes     HDL Cholesterol: 33 mg/dL     Total Cholesterol: 159 mg/dL  A/P: Hypertension longstanding currently at goal on current medications. BP Goal = <130/80 mmHg. Patient is adherent with current medications.  -Continued current regimen.   -Counseled on lifestyle modifications for blood pressure control including reduced dietary sodium, increased exercise, adequate sleep  Results reviewed and written information provided. Total time in face-to-face counseling 15 minutes.   F/U Clinic Visit with PCP.   Benard Halsted, PharmD, [place] [phone]

## 2019-07-27 NOTE — Patient Instructions (Signed)
Thank you for coming to see [place] today.   Blood pressure today is well-controlled.   Continue taking blood pressure medications as prescribed.   Limiting salt and caffeine, as well as exercising as able for at least 30 minutes for 5 days out of the week, can also help you lower your blood pressure.  Take your blood pressure at home if you are able. Please write down these numbers and bring them to your visits.  If you have any questions about medications, please call me [phone].  Lurena Joiner

## 2019-07-30 ENCOUNTER — Ambulatory Visit
Admission: RE | Admit: 2019-07-30 | Discharge: 2019-07-30 | Disposition: A | Discharge status: Home / Self Care | Payer: BC Managed Care – PPO | Source: Ambulatory Visit | Attending: Nurse Practitioner | Admitting: Nurse Practitioner

## 2019-07-30 ENCOUNTER — Other Ambulatory Visit: Payer: Self-pay

## 2019-07-30 DIAGNOSIS — Z1231 Encounter for screening mammogram for malignant neoplasm of breast: Secondary | ICD-10-CM | POA: Diagnosis not present

## 2019-07-30 IMAGING — MG MM DIGITAL SCREENING BILAT W/ TOMO W/ CAD
8 series · 8 of 24 positions shown · non-contrast
Comparison: Previous exam(s).

CLINICAL DATA: Screening.

EXAM:
DIGITAL SCREENING BILATERAL MAMMOGRAM WITH TOMO AND CAD

[L MLO synth-2D]
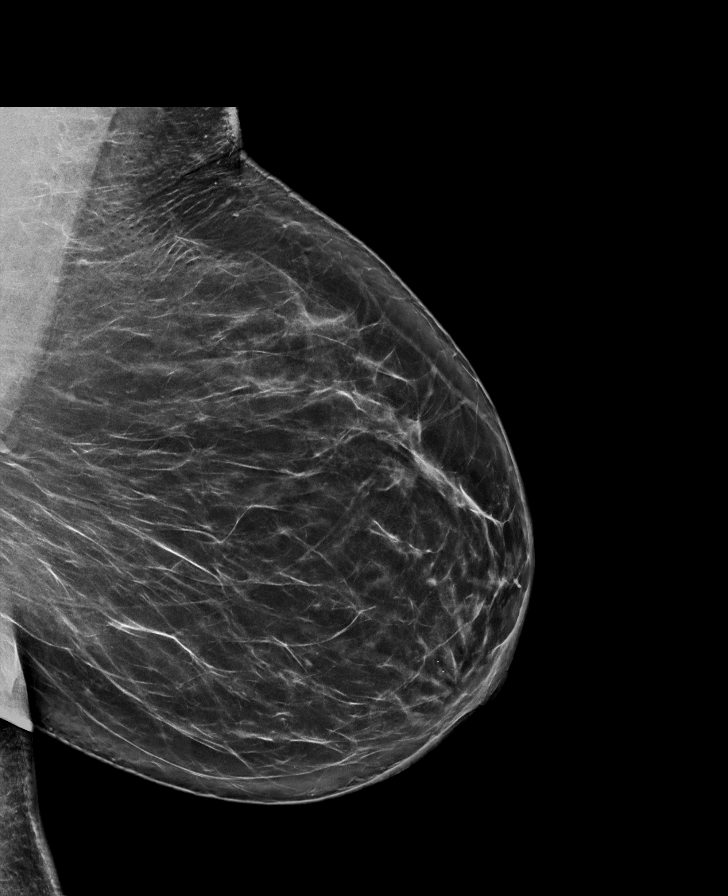

[R CC synth-2D]
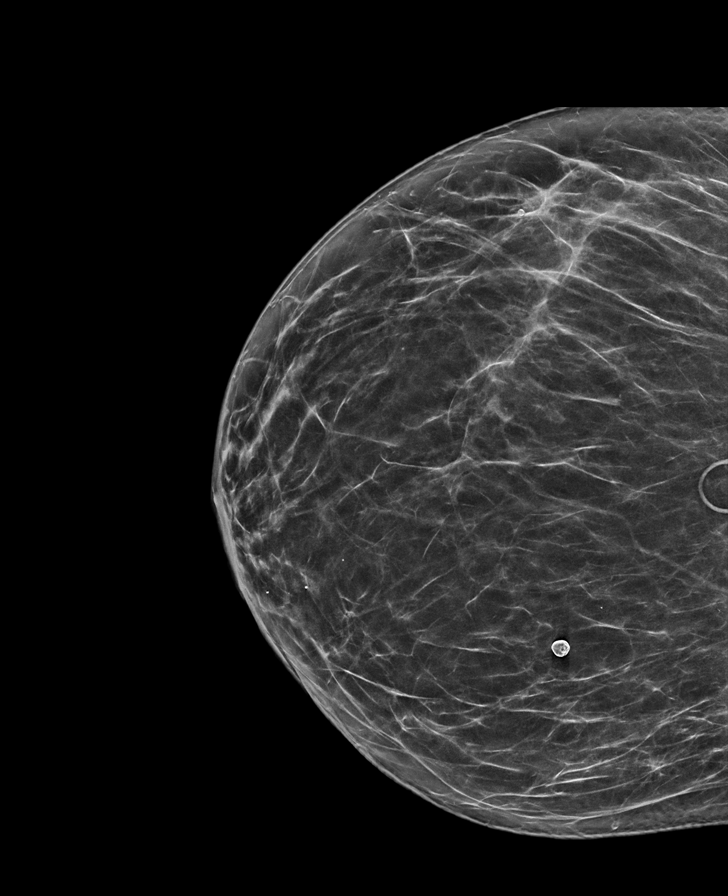

[R MLO synth-2D]
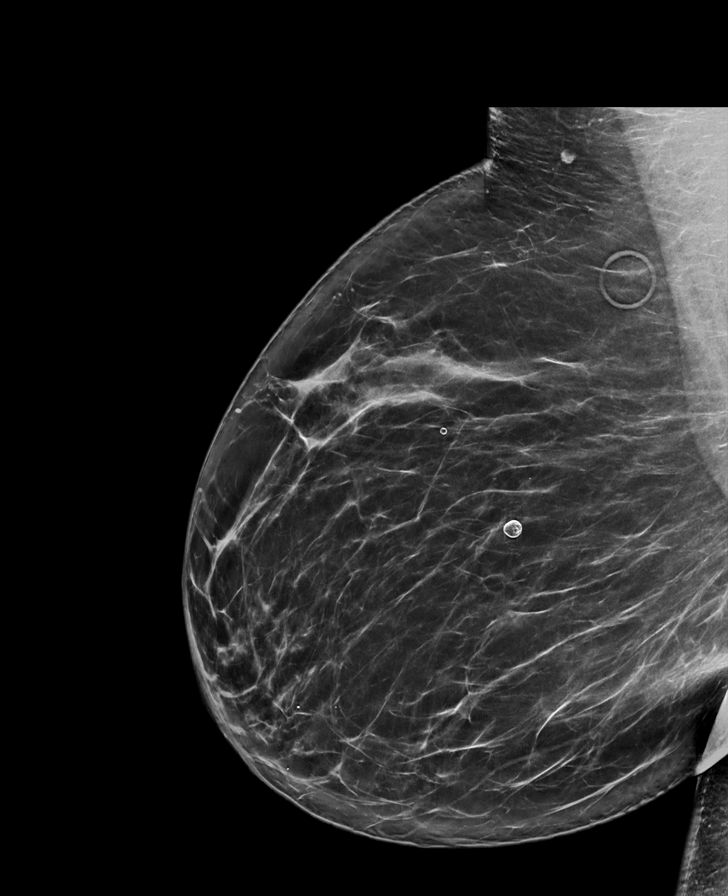

[L CC synth-2D]
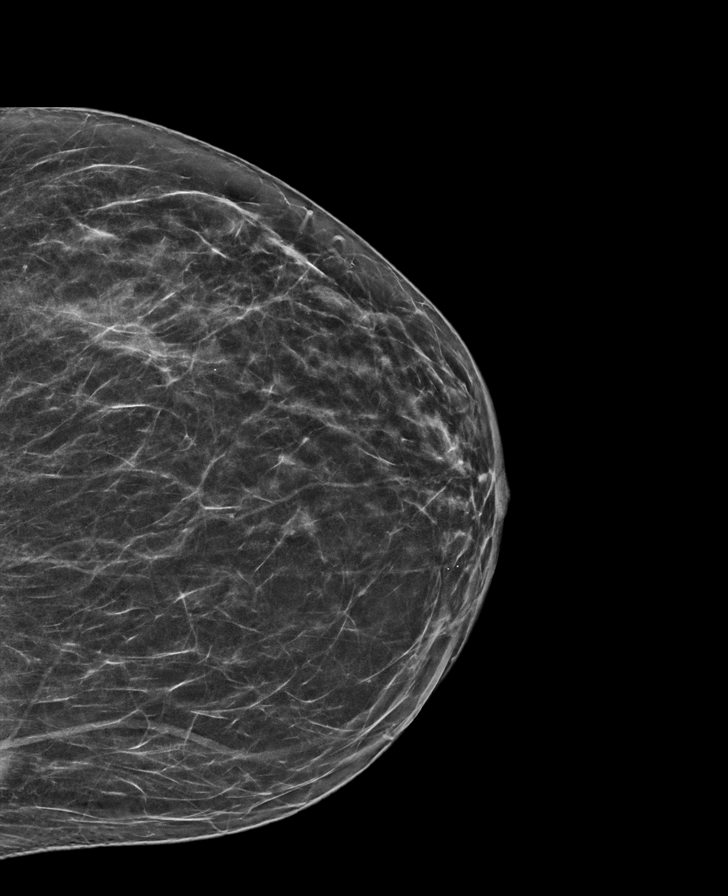

[L CC tomo · tomo slice 31/60.0]
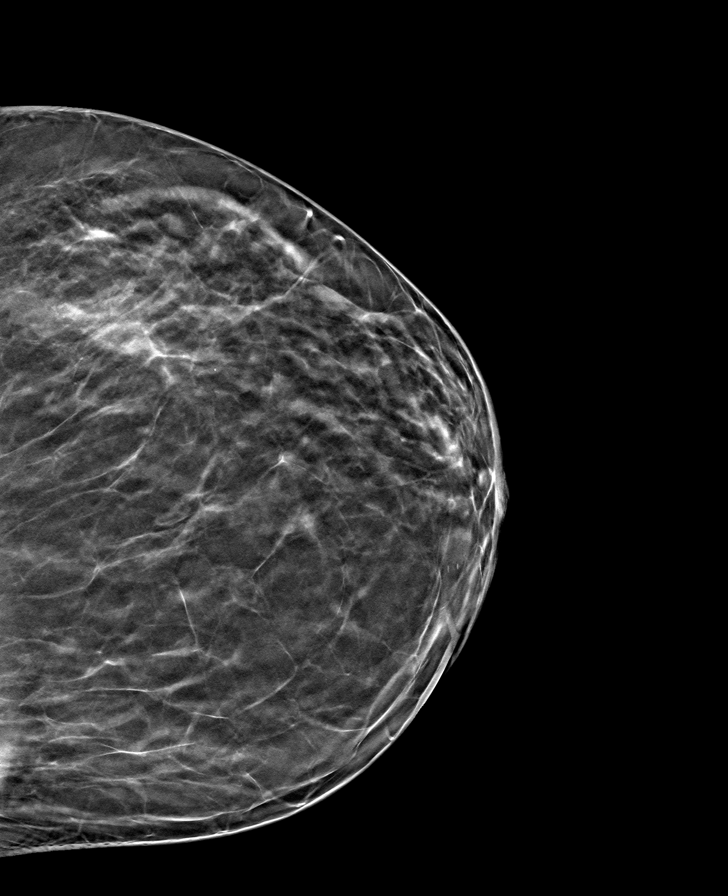

[R CC tomo · tomo slice 35/70.0]
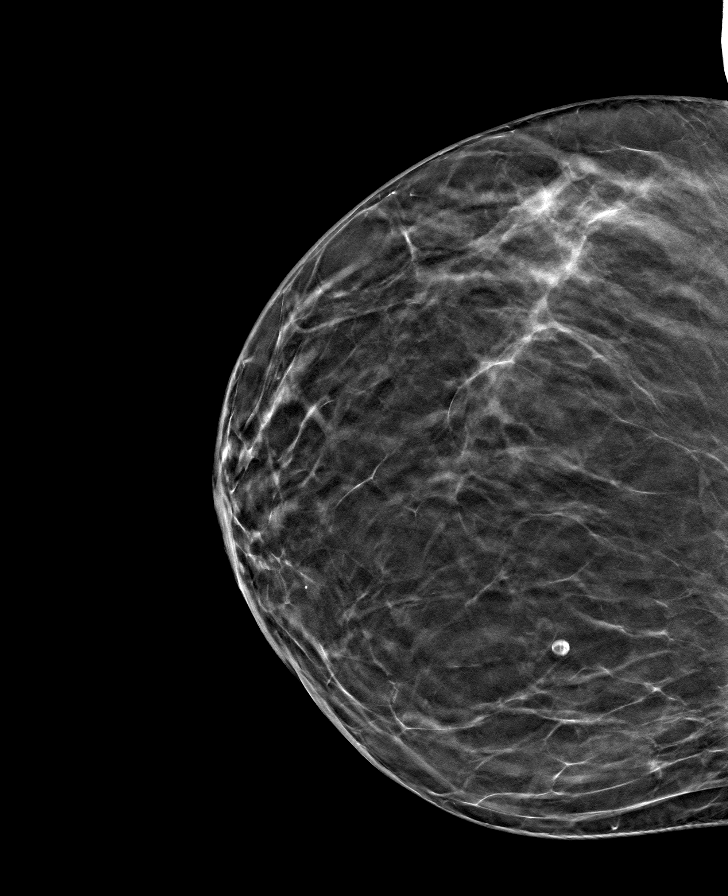

[L MLO tomo · tomo slice 43/85.0]
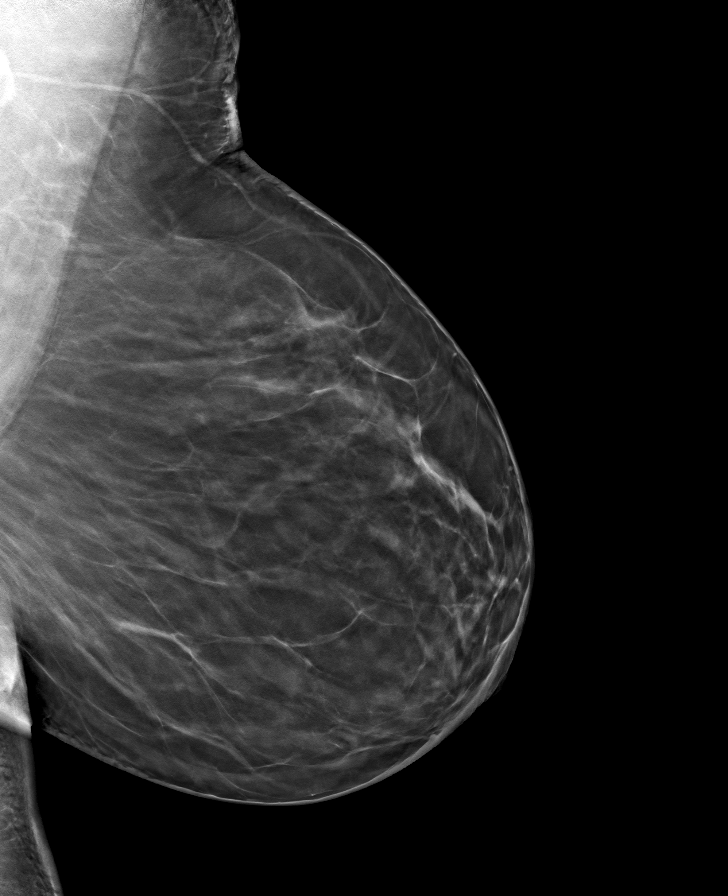

[R MLO tomo · tomo slice 43/85.0]
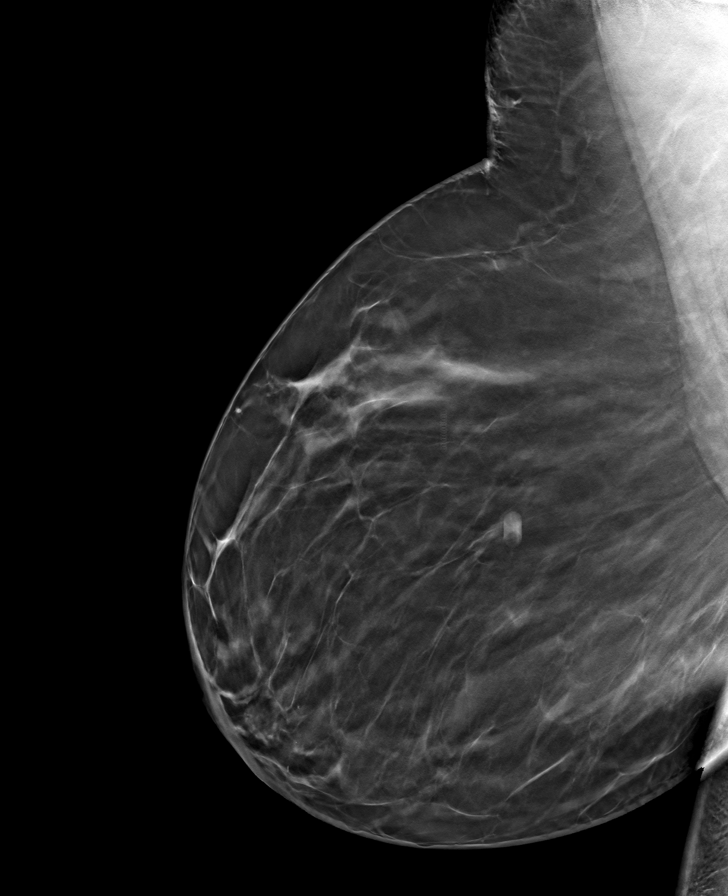

[8 of 24 positions shown; findings below may reference images not displayed]

ACR Breast Density Category b: There are scattered areas of
fibroglandular density.
FINDINGS: There are no findings suspicious for malignancy. Images were
processed with CAD.
IMPRESSION: No mammographic evidence of malignancy. A result letter of this
screening mammogram will be mailed directly to the patient.

RECOMMENDATION:
Screening mammogram in one year. (Code:[TQ])

BI-RADS CATEGORY  1: Negative.

## 2019-08-12 ENCOUNTER — Encounter: Payer: BC Managed Care – PPO | Attending: Nurse Practitioner | Admitting: Registered"

## 2019-08-12 ENCOUNTER — Other Ambulatory Visit: Payer: Self-pay

## 2019-08-12 ENCOUNTER — Encounter: Payer: Self-pay | Admitting: Registered"

## 2019-08-12 DIAGNOSIS — E669 Obesity, unspecified: Secondary | ICD-10-CM | POA: Diagnosis not present

## 2019-08-12 DIAGNOSIS — E1169 Type 2 diabetes mellitus with other specified complication: Secondary | ICD-10-CM | POA: Insufficient documentation

## 2019-08-12 NOTE — Progress Notes (Signed)
Diabetes Self-Management Education  Visit Type:    Appt. Start Time: 1447 Appt. End Time: 2297  08/12/2019  Angela Castillo, identified by name and date of birth, is a 43 y.o. female with a diagnosis of Diabetes:  .   ASSESSMENT  Weight 220 lb 3.2 oz (99.9 kg). Body mass index is 37.21 kg/m.   Pt present alone for visit. Pt reports her procedure went pretty well but she did experience a lot of soreness right afterward. Pt reports she has had two lows since last visit, one of which was in the 50s with no symptoms. Reports her highest number was in the 140s.  Reports her fasting numbers have been ranging between mid 90s to low 100s for the most part. Pt unable to remember postprandial but reports they have been about the same as fasting. Noted pt's most recent hgbA1c on 06/25/19 was 5.4.   Pt reports she had gotten down to around 216 lb. Reports she cannot pinpoint what she has been doing different that could have caused weight regain.   Pt reports she has started sometimes packing rotisserie chicken, potato salad, pudding, and cheddar cheese stick for work lunch. Reports she ate a cookie one day and checked blood sugar 2 hours after and it was still within range. Pt reports she will change her snack time to 7AM to it will be easier for her to take a break to eat. Pt reports she could add grapes as the carbohydrate to go along with cheese and/or nuts. Pt reports she has not been drinking as much water and does not know why.   Pt reports she has been trying to walk more at work/get in more steps. Reports she tries to park farther away when shopping to increase steps. Pt reports she prefers going to parks to walk rather than in her neighborhood. Reports she was going to renew her [employer] decided not to do so because she would rather do activities outdoors.   24-Hour Recall: Work Day 330-4 AM: Cinnamon Chex cereal, 1% milk  8 AM: cheese  10 AM: 1.5 slices pepperoni pizza,  sugar free pudding, water  1200: None reported.  1 PM: Equate protein shake  330PM: salad with spinach and spring mix, mushrooms, almond slivers, poppyseed salad dressing, no beverage 530PM: None reported.  Beverages: water  Individualized Plan for Diabetes Self-Management Training:  Learning Objective:  Patient will have a greater understanding of diabetes self-management. Patient education plan is to attend individual and/or group sessions per assessed needs and concerns.   Intervention: Discussed ensuring meals are balanced including a lean protein, carbohydrate, and vegetables. Discussed how to balance out meal of only salad by adding in a protein and a whole grain or fruit. Discussed avoiding going more than 4-5 hours without consuming any type of carbohydrate to help prevent additional lows. Discussed adding whole grain crackers or fruit with protein at morning snack which is 3-4 hours after breakfast and 2-3 hours before next meal. Discussed choosing the low sodium versions. Encouraged pt to add in more walking time-could start with 10 minutes most days. Encouraged increasing water intake. Pt appeared agreeable to information/goals discussed.   Instructions/Goals:  Make sure to get in three meals per day. Try to have balanced meals like the plate example (see handout). Include lean proteins, vegetables, fruits, and whole grains at meals.  -Continue working to eat every 3-5 hours while awake. Try to avoid going more than 4-5 hours without having some carbohydrates to  help prevent lows. Could add Triscuits (hint of salt for lower sodium) OR a fruit along with either cheese, nuts (low sodium), or other protein to morning snack to help prevent lows.  -Recommend 2-3 carbohydrates choices per meal (30-45 g).  -Want to ensure each meal include lean protein, carbohydrate (30-45 g), and vegetables.   Try to include more water: ultimate goal at least 64 oz per day.   -Continue checking blood  sugar as recommended by your doctor.  Make physical activity a part of your week. Try to include at least 30 minutes of physical activity 5 days each week or at least 150 minutes per week. Regular physical activity promotes overall health-including helping to reduce risk for heart disease and diabetes, promoting mental health, and helping [place] sleep better.  -Goal: Add 10 minutes of walking to your days and gradually work to 30 minutes most days. This can include indoors or outdoors.  Patient Instructions  Instructions/Goals:  Make sure to get in three meals per day. Try to have balanced meals like the plate example (see handout). Include lean proteins, vegetables, fruits, and whole grains at meals.  -Continue working to eat every 3-5 hours while awake. Try to avoid going more than 4-5 hours without having some carbohydrates to help prevent lows. Could add Triscuits (hint of salt for lower sodium) OR a fruit along with either cheese, nuts (low sodium), or other protein to morning snack to help prevent lows.  -Recommend 2-3 carbohydrates choices per meal (30-45 g).  -Want to ensure each meal include lean protein, carbohydrate (30-45 g), and vegetables.   Try to include more water: ultimate goal at least 64 oz per day.   -Continue checking blood sugar as recommended by your doctor.  Make physical activity a part of your week. Try to include at least 30 minutes of physical activity 5 days each week or at least 150 minutes per week. Regular physical activity promotes overall health-including helping to reduce risk for heart disease and diabetes, promoting mental health, and helping [place] sleep better.  -Goal: Add 10 minutes of walking to your days and gradually work to 30 minutes most days. This can include indoors or outdoors.    If problems or questions, patient to contact team via:  Phone and Email  Future DSME appointment:  1 month

## 2019-08-12 NOTE — Patient Instructions (Addendum)
Instructions/Goals:  Make sure to get in three meals per day. Try to have balanced meals like the plate example (see handout). Include lean proteins, vegetables, fruits, and whole grains at meals.  -Continue working to eat every 3-5 hours while awake. Try to avoid going more than 4-5 hours without having some carbohydrates to help prevent lows. Could add Triscuits (hint of salt for lower sodium) OR a fruit along with either cheese, nuts (low sodium), or other protein to morning snack to help prevent lows.  -Recommend 2-3 carbohydrates choices per meal (30-45 g).  -Want to ensure each meal include lean protein, carbohydrate (30-45 g), and vegetables.   Try to include more water: ultimate goal at least 64 oz per day.   -Continue checking blood sugar as recommended by your doctor.  Make physical activity a part of your week. Try to include at least 30 minutes of physical activity 5 days each week or at least 150 minutes per week. Regular physical activity promotes overall health-including helping to reduce risk for heart disease and diabetes, promoting mental health, and helping [place] sleep better.  -Goal: Add 10 minutes of walking to your days and gradually work to 30 minutes most days. This can include indoors or outdoors.

## 2019-08-13 ENCOUNTER — Encounter: Payer: Self-pay | Admitting: Nurse Practitioner

## 2019-08-13 ENCOUNTER — Other Ambulatory Visit (HOSPITAL_COMMUNITY)
Admission: RE | Admit: 2019-08-13 | Discharge: 2019-08-13 | Disposition: A | Discharge status: Home / Self Care | Payer: BC Managed Care – PPO | Source: Ambulatory Visit | Attending: Nurse Practitioner | Admitting: Nurse Practitioner

## 2019-08-13 ENCOUNTER — Other Ambulatory Visit: Payer: Self-pay

## 2019-08-13 ENCOUNTER — Ambulatory Visit (HOSPITAL_BASED_OUTPATIENT_CLINIC_OR_DEPARTMENT_OTHER): Payer: BC Managed Care – PPO | Admitting: Nurse Practitioner

## 2019-08-13 VITALS — BP 116/77 | HR 59 | Temp 98.6°F | Ht 64.5 in | Wt 219.8 lb

## 2019-08-13 DIAGNOSIS — R1900 Intra-abdominal and pelvic swelling, mass and lump, unspecified site: Secondary | ICD-10-CM | POA: Diagnosis not present

## 2019-08-13 DIAGNOSIS — L219 Seborrheic dermatitis, unspecified: Secondary | ICD-10-CM

## 2019-08-13 DIAGNOSIS — Z124 Encounter for screening for malignant neoplasm of cervix: Secondary | ICD-10-CM

## 2019-08-13 DIAGNOSIS — IMO0002 Reserved for concepts with insufficient information to code with codable children: Secondary | ICD-10-CM

## 2019-08-13 DIAGNOSIS — E1165 Type 2 diabetes mellitus with hyperglycemia: Secondary | ICD-10-CM

## 2019-08-13 DIAGNOSIS — Z1151 Encounter for screening for human papillomavirus (HPV): Secondary | ICD-10-CM | POA: Insufficient documentation

## 2019-08-13 DIAGNOSIS — E119 Type 2 diabetes mellitus without complications: Secondary | ICD-10-CM | POA: Diagnosis not present

## 2019-08-13 DIAGNOSIS — H5213 Myopia, bilateral: Secondary | ICD-10-CM | POA: Diagnosis not present

## 2019-08-13 DIAGNOSIS — H52203 Unspecified astigmatism, bilateral: Secondary | ICD-10-CM | POA: Diagnosis not present

## 2019-08-13 DIAGNOSIS — Z7984 Long term (current) use of oral hypoglycemic drugs: Secondary | ICD-10-CM | POA: Diagnosis not present

## 2019-08-13 LAB — GLUCOSE, POCT (MANUAL RESULT ENTRY): POC Glucose: 96 mg/dl (ref 70–99)

## 2019-08-13 LAB — HM DIABETES EYE EXAM

## 2019-08-13 MED ORDER — KETOCONAZOLE 2 % EX CREA: 1.0000 "application " | TOPICAL_CREAM | Freq: Every day | CUTANEOUS | 3 refills | Status: DC

## 2019-08-13 MED ORDER — METFORMIN HCL 1000 MG PO TABS: 1000.0000 mg | ORAL_TABLET | Freq: Two times a day (BID) | ORAL | 1 refills | Status: DC

## 2019-08-13 NOTE — Progress Notes (Signed)
Assessment & Plan:  Angela Castillo was seen today for gynecologic exam.  Diagnoses and all orders for this visit:  Encounter for Papanicolaou smear for cervical cancer screening -     Cytology - PAP -     Cervicovaginal ancillary only  Diabetes mellitus type 2, uncontrolled, with complications (HCC) -     Glucose (CBG) -     metFORMIN (GLUCOPHAGE) 1000 MG tablet; Take 1 tablet (1,000 mg total) by mouth 2 (two) times daily with a meal. TAKE 1 TABLET BY MOUTH TWICE DAILY WITH A MEAL  Controlled Continue medications as prescribed.  Continue blood sugar control as discussed in office today, low carbohydrate diet, and regular physical exercise as tolerated, 150 minutes per week (30 min each day, 5 days per week, or 50 min 3 days per week). Keep blood sugar logs with fasting goal of 90-130 mg/dl, post prandial (after you eat) less than 180.  For Hypoglycemia: BS <60 and Hyperglycemia BS >400; contact the clinic ASAP. Annual eye exams and foot exams are recommended.   Palpable abdominal mass -     CT Abdomen Pelvis W Contrast; Future  Seborrheic dermatitis -     ketoconazole (NIZORAL) 2 % cream; Apply 1 application topically daily.    Patient has been counseled on age-appropriate routine health concerns for screening and prevention. These are reviewed and up-to-date. Referrals have been placed accordingly. Immunizations are up-to-date or declined.    Subjective:   Chief Complaint  Patient presents with  . Gynecologic Exam    Pt. is here for a pap smear.    HPI Angela Castillo 43 y.o. female presents to office today   Review of Systems  Constitutional: Negative.  Negative for chills, fever, malaise/fatigue and weight loss.  Respiratory: Negative.  Negative for cough, sputum production, shortness of breath and wheezing.   Cardiovascular: Negative.  Negative for chest pain and leg swelling.  Gastrointestinal: Negative.  Negative for abdominal pain, blood in stool, constipation,  diarrhea, heartburn, melena, nausea and vomiting.  Genitourinary: Negative.   Skin: Positive for itching and rash.  Psychiatric/Behavioral: Negative.  Negative for depression and suicidal ideas. The patient is not nervous/anxious and does not have insomnia.     Past Medical History:  Diagnosis Date  . Anemia Dx January 03 2015  . Diabetes mellitus without complication ([place])   . Elevated liver enzymes   . Essential hypertension Dx Apr 2016  . H/O: upper GI bleed   . Liver cirrhosis secondary to NASH ([place])   . Reflux     Past Surgical History:  Procedure Laterality Date  . ESOPHAGEAL BANDING  03/22/2019   Procedure: ESOPHAGEAL BANDING;  Surgeon: Wilford Corner, MD;  Location: [facility];  Service: Endoscopy;;  . ESOPHAGEAL BANDING  03/23/2019   Procedure: ESOPHAGEAL BANDING;  Surgeon: Wilford Corner, MD;  Location: [facility];  Service: Endoscopy;;  . ESOPHAGOGASTRODUODENOSCOPY N/A 03/22/2019   Procedure: ESOPHAGOGASTRODUODENOSCOPY (EGD);  Surgeon: Wilford Corner, MD;  Location: [facility];  Service: Endoscopy;  Laterality: N/A;  . ESOPHAGOGASTRODUODENOSCOPY N/A 03/23/2019   Procedure: ESOPHAGOGASTRODUODENOSCOPY (EGD);  Surgeon: Wilford Corner, MD;  Location: [facility];  Service: Endoscopy;  Laterality: N/A;  . ESOPHAGOGASTRODUODENOSCOPY (EGD) WITH PROPOFOL N/A 07/23/2019   Procedure: ESOPHAGOGASTRODUODENOSCOPY (EGD) WITH PROPOFOL with possible banding;  Surgeon: Wilford Corner, MD;  Location: [facility];  Service: Endoscopy;  Laterality: N/A;  . TUBAL LIGATION  Jul 25, 2000    Family History  Problem Relation Age of Onset  . COPD Mother   .  Esophageal cancer Mother   . Diabetes Mother   . Cancer Mother   . Hypertension Mother   . Diabetes Father   . Breast cancer Neg Hx     Social History Reviewed with no changes to be made today.   Outpatient Medications Prior to Visit  Medication Sig Dispense Refill  . acetaminophen (TYLENOL) 650 MG CR tablet Take  1,300 mg by mouth daily as needed for pain.    Marland Kitchen amLODipine (NORVASC) 5 MG tablet Take 1 tablet (5 mg total) by mouth daily. 90 tablet 3  . blood glucose meter kit and supplies KIT Dispense based on patient and insurance preference. Use up to four times daily as directed. (FOR ICD-9 250.00, 250.01). Dispense Relion 1 each 0  . cyclobenzaprine (FLEXERIL) 10 MG tablet Take 1 tablet (10 mg total) by mouth 3 (three) times daily as needed for muscle spasms. 30 tablet 0  . ferrous sulfate 325 (65 FE) MG tablet Take 1 tablet (325 mg total) by mouth 2 (two) times daily with a meal. 60 tablet 0  . fluticasone (FLONASE) 50 MCG/ACT nasal spray Place 1 spray into both nostrils daily.    Marland Kitchen glucose blood (CONTOUR NEXT TEST) test strip Use up to four times daily as directed. (FOR ICD-9 250.00, 250.01). 100 each 11  . losartan (COZAAR) 100 MG tablet Take 1 tablet (100 mg total) by mouth daily. 90 tablet 1  . Microlet Lancets MISC Use up to four times daily as directed. (FOR ICD-9 250.00, 250.01). 100 each 11  . pantoprazole (PROTONIX) 40 MG tablet Take 1 tablet (40 mg total) by mouth daily. 90 tablet 1  . propranolol (INDERAL) 20 MG tablet Take 1 tablet (20 mg total) by mouth 2 (two) times daily. 60 tablet 3  . metFORMIN (GLUCOPHAGE) 1000 MG tablet TAKE 1 TABLET BY MOUTH TWICE DAILY WITH A MEAL (Patient taking differently: Take 1,000 mg by mouth 2 (two) times daily with a meal. ) 180 tablet 0   No facility-administered medications prior to visit.     Allergies  Allergen Reactions  . Nsaids     GI BLEED       Objective:    BP 116/77 (BP Location: Left Arm, Patient Position: Sitting, Cuff Size: Normal)   Pulse (!) 59   Temp 98.6 F (37 C) (Oral)   Ht 5' 4.5" (1.638 m)   Wt 219 lb 12.8 oz (99.7 kg)   SpO2 98%   BMI 37.15 kg/m  Wt Readings from Last 3 Encounters:  08/13/19 219 lb 12.8 oz (99.7 kg)  08/12/19 220 lb 3.2 oz (99.9 kg)  07/23/19 221 lb (100.2 kg)    Physical Exam Constitutional:       Appearance: She is well-developed.  HENT:     Head: Normocephalic.      Comments: Seborrheic dermatitis rash Cardiovascular:     Rate and Rhythm: Normal rate and regular rhythm.     Heart sounds: Normal heart sounds.  Pulmonary:     Effort: Pulmonary effort is normal.     Breath sounds: Normal breath sounds.  Abdominal:     General: Bowel sounds are normal.     Palpations: Abdomen is soft. There is mass. There is no pulsatile mass.     Tenderness: There is no abdominal tenderness.     Hernia: There is no hernia in the left inguinal area.    Genitourinary:    Labia:        Right: No rash, tenderness, lesion  or injury.        Left: No rash, tenderness, lesion or injury.      Vagina: Normal. No signs of injury and foreign body. No vaginal discharge, erythema, tenderness or bleeding.     Cervix: No discharge or erythema.     Uterus: Normal. Not deviated and not enlarged.      Adnexa:        Right: No mass, tenderness or fullness.         Left: No mass, tenderness or fullness.       Rectum: Normal. No external hemorrhoid.  Lymphadenopathy:     Lower Body: No right inguinal adenopathy. No left inguinal adenopathy.  Skin:    General: Skin is warm and dry.     Findings: Rash present. Rash is crusting.  Neurological:     Mental Status: She is alert and oriented to person, place, and time.  Psychiatric:        Behavior: Behavior normal.        Thought Content: Thought content normal.        Judgment: Judgment normal.          Patient has been counseled extensively about nutrition and exercise as well as the importance of adherence with medications and regular follow-up. The patient was given clear instructions to go to ER or return to medical center if symptoms don't improve, worsen or new problems develop. The patient verbalized understanding.   Follow-up: Return in about 7 weeks (around 10/04/2019) for A1c LIPIDS.   Gildardo Pounds, FNP-BC [facility] and  [facility]   08/13/2019, 9:09 AM

## 2019-08-17 ENCOUNTER — Telehealth: Payer: Self-pay | Admitting: Nurse Practitioner

## 2019-08-17 ENCOUNTER — Other Ambulatory Visit: Payer: Self-pay

## 2019-08-17 DIAGNOSIS — R1900 Intra-abdominal and pelvic swelling, mass and lump, unspecified site: Secondary | ICD-10-CM

## 2019-08-17 NOTE — Telephone Encounter (Signed)
lanney with [employer] called for a prior auth for the patients CT please follow up.

## 2019-08-17 NOTE — Telephone Encounter (Signed)
CT abdomen & pelvis contrast requires Peer to peer. Call number for peer to peer [phone]

## 2019-08-18 NOTE — Telephone Encounter (Signed)
Please cancel and keep US abdomen. Thanks

## 2019-08-19 ENCOUNTER — Encounter: Payer: Self-pay | Admitting: Nurse Practitioner

## 2019-08-20 ENCOUNTER — Ambulatory Visit (HOSPITAL_COMMUNITY): Payer: BC Managed Care – PPO

## 2019-08-20 ENCOUNTER — Encounter (HOSPITAL_COMMUNITY): Payer: Self-pay

## 2019-08-23 LAB — CERVICOVAGINAL ANCILLARY ONLY
Bacterial Vaginitis (gardnerella): POSITIVE — AB
Candida Glabrata: NEGATIVE
Candida Vaginitis: NEGATIVE
Chlamydia: NEGATIVE
Comment: NEGATIVE
Comment: NEGATIVE
Comment: NEGATIVE
Comment: NEGATIVE
Comment: NEGATIVE
Comment: NORMAL
Neisseria Gonorrhea: NEGATIVE
Trichomonas: NEGATIVE

## 2019-08-24 ENCOUNTER — Other Ambulatory Visit: Payer: Self-pay | Admitting: Nurse Practitioner

## 2019-08-24 DIAGNOSIS — K7460 Unspecified cirrhosis of liver: Secondary | ICD-10-CM | POA: Diagnosis not present

## 2019-08-24 DIAGNOSIS — I8501 Esophageal varices with bleeding: Secondary | ICD-10-CM | POA: Diagnosis not present

## 2019-08-24 DIAGNOSIS — K7581 Nonalcoholic steatohepatitis (NASH): Secondary | ICD-10-CM | POA: Diagnosis not present

## 2019-08-24 MED ORDER — METRONIDAZOLE 500 MG PO TABS: 500.0000 mg | ORAL_TABLET | Freq: Two times a day (BID) | ORAL | 0 refills | Status: AC

## 2019-08-25 ENCOUNTER — Other Ambulatory Visit: Payer: Self-pay | Admitting: Gastroenterology

## 2019-08-25 LAB — CYTOLOGY - PAP
Chlamydia: NEGATIVE
Comment: NEGATIVE
Comment: NEGATIVE
Comment: NORMAL
Diagnosis: NEGATIVE
High risk HPV: NEGATIVE
Neisseria Gonorrhea: NEGATIVE

## 2019-08-26 ENCOUNTER — Encounter: Payer: Self-pay | Admitting: Nurse Practitioner

## 2019-08-27 ENCOUNTER — Encounter (HOSPITAL_COMMUNITY): Payer: Self-pay

## 2019-08-27 ENCOUNTER — Ambulatory Visit (HOSPITAL_COMMUNITY): Payer: BC Managed Care – PPO

## 2019-08-27 DIAGNOSIS — K7460 Unspecified cirrhosis of liver: Secondary | ICD-10-CM | POA: Diagnosis not present

## 2019-08-28 ENCOUNTER — Encounter: Payer: Self-pay | Admitting: Nurse Practitioner

## 2019-08-28 NOTE — Telephone Encounter (Signed)
Angela Castillo can you find out what happened with her imaging? She said it was canceled and I am not sure why. Thanks! If it was rescheduled can you let her know

## 2019-09-01 NOTE — Telephone Encounter (Signed)
Pt. Ultrasound has been scheduled. Attempt to reach patient, no answer and LVM.  CMA did send a MyChart message to patient regarding ultrasound date and time.

## 2019-09-09 ENCOUNTER — Encounter: Payer: Self-pay | Admitting: Podiatry

## 2019-09-10 ENCOUNTER — Ambulatory Visit (HOSPITAL_COMMUNITY)
Admission: RE | Admit: 2019-09-10 | Discharge: 2019-09-10 | Disposition: A | Discharge status: Home / Self Care | Payer: BC Managed Care – PPO | Source: Ambulatory Visit | Attending: Nurse Practitioner | Admitting: Nurse Practitioner

## 2019-09-10 ENCOUNTER — Other Ambulatory Visit: Payer: Self-pay

## 2019-09-10 DIAGNOSIS — R1900 Intra-abdominal and pelvic swelling, mass and lump, unspecified site: Secondary | ICD-10-CM | POA: Diagnosis not present

## 2019-09-10 IMAGING — US US ABDOMEN LIMITED
1 series · 12 of 12 positions shown · non-contrast
Comparison: None.

CLINICAL DATA: Soft tissue prominence abdominal wall

EXAM:
ULTRASOUND ABDOMEN LIMITED/ABDOMINAL WALL

[Series 1: us abdomen limited · 12 acquisitions, 12 frames shown]
[im 1/12]
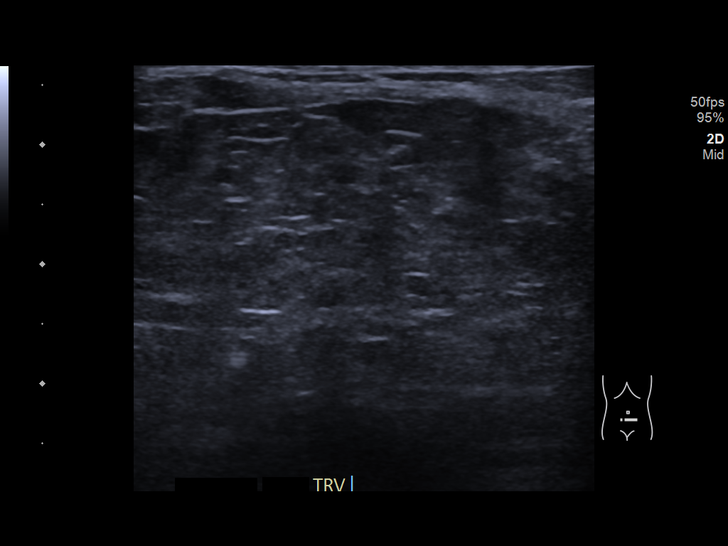
[im 2/12]
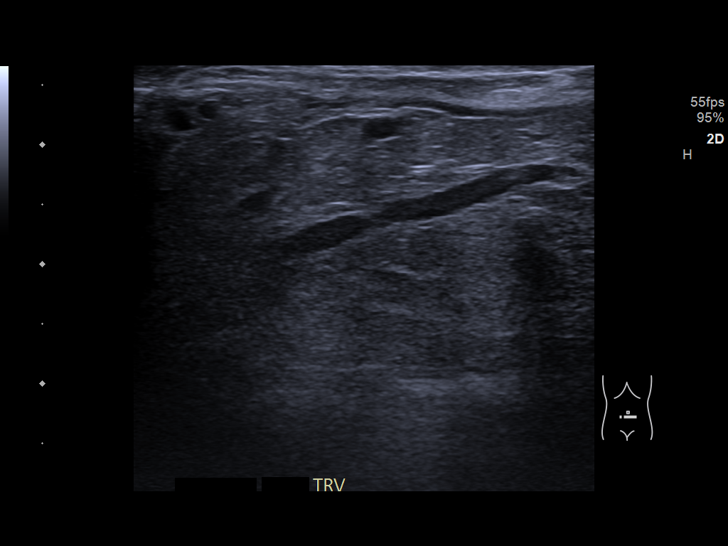
[im 3/12]
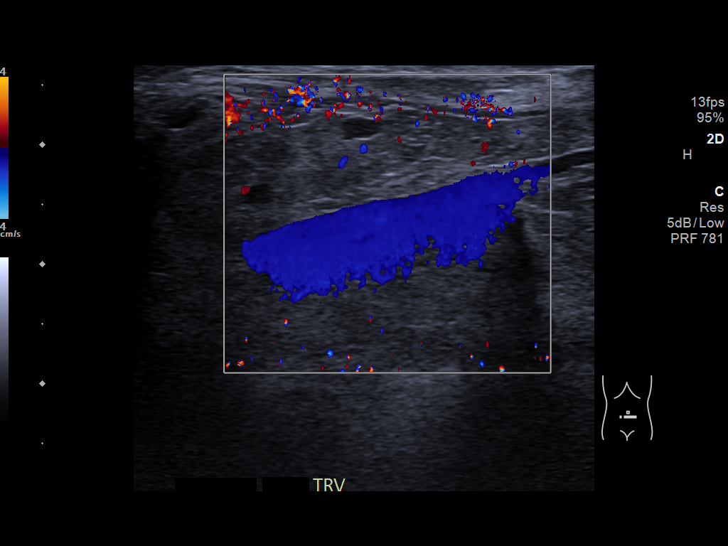
[im 4/12]
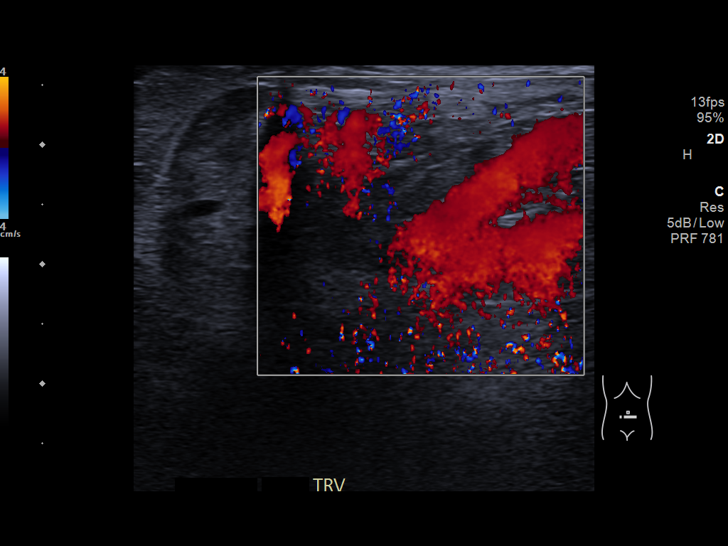
[im 5/12]
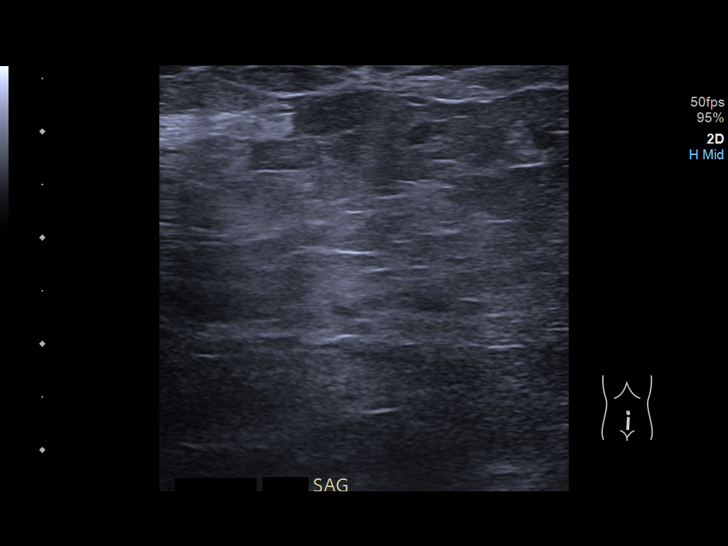
[im 6/12]
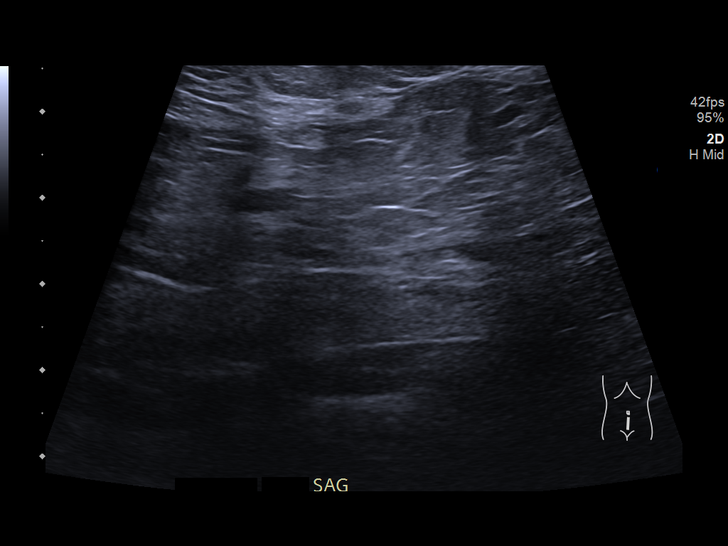
[im 7/12]
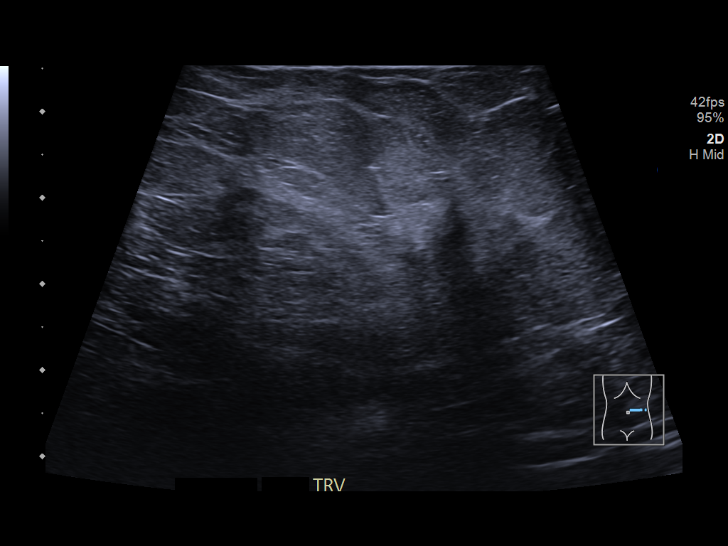
[im 8/12]
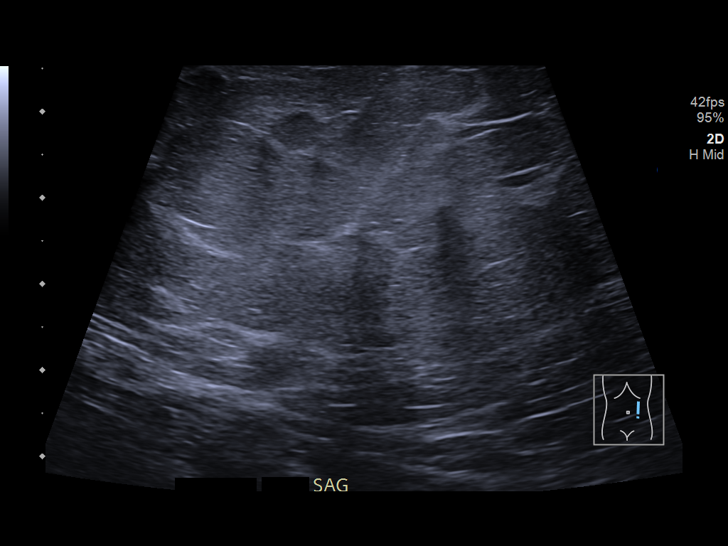
[im 9/12]
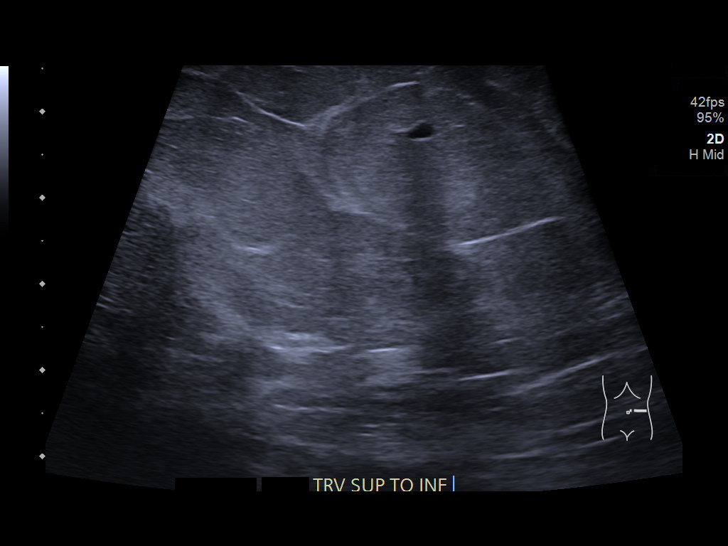
[im 10/12]
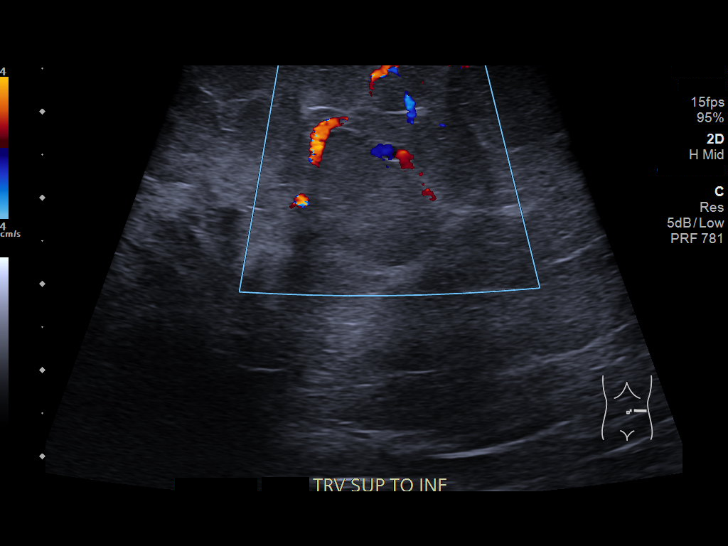
[im 11/12]
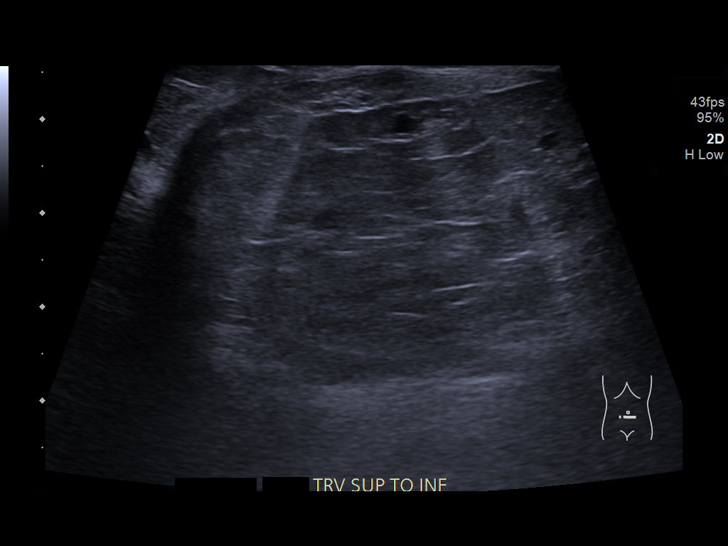
[im 12/12]
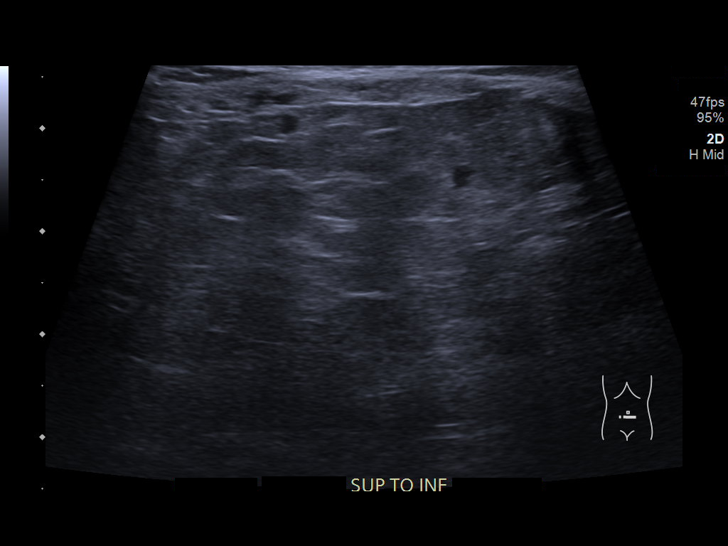

[12 of 12 positions shown; findings below may reference images not displayed]

FINDINGS: Longitudinal and transverse images were obtained in the anterior
abdominal wall regions slightly lateral to the umbilicus. Ultrasound
of this area shows normal appearing fatty and muscular tissue
without well-defined mass or inflammatory focus. No abnormal fluid.
No hernias are evident.
IMPRESSION: No lesions seen by ultrasound in the area of clinical concern in the
abdominal wall in the left periumbilical and lower abdominal
regions. If there remains clinical concern for pathology in this
area, would advise CT of the abdomen and pelvis, ideally with oral
and intravenous contrast, to further assess.

## 2019-09-16 ENCOUNTER — Other Ambulatory Visit: Payer: Self-pay | Admitting: Gastroenterology

## 2019-09-16 ENCOUNTER — Other Ambulatory Visit (HOSPITAL_COMMUNITY)
Admission: RE | Admit: 2019-09-16 | Discharge: 2019-09-16 | Disposition: A | Discharge status: Home / Self Care | Payer: BC Managed Care – PPO | Source: Ambulatory Visit | Attending: Gastroenterology | Admitting: Gastroenterology

## 2019-09-16 DIAGNOSIS — Z20828 Contact with and (suspected) exposure to other viral communicable diseases: Secondary | ICD-10-CM | POA: Insufficient documentation

## 2019-09-16 DIAGNOSIS — Z01812 Encounter for preprocedural laboratory examination: Secondary | ICD-10-CM | POA: Insufficient documentation

## 2019-09-17 ENCOUNTER — Other Ambulatory Visit: Payer: Self-pay

## 2019-09-17 ENCOUNTER — Encounter (HOSPITAL_COMMUNITY): Payer: Self-pay

## 2019-09-17 ENCOUNTER — Ambulatory Visit: Payer: BC Managed Care – PPO | Attending: Nurse Practitioner | Admitting: Nurse Practitioner

## 2019-09-17 ENCOUNTER — Encounter: Payer: Self-pay | Admitting: Nurse Practitioner

## 2019-09-17 DIAGNOSIS — E1165 Type 2 diabetes mellitus with hyperglycemia: Secondary | ICD-10-CM

## 2019-09-17 DIAGNOSIS — K219 Gastro-esophageal reflux disease without esophagitis: Secondary | ICD-10-CM

## 2019-09-17 DIAGNOSIS — I10 Essential (primary) hypertension: Secondary | ICD-10-CM | POA: Diagnosis not present

## 2019-09-17 DIAGNOSIS — IMO0002 Reserved for concepts with insufficient information to code with codable children: Secondary | ICD-10-CM

## 2019-09-17 LAB — NOVEL CORONAVIRUS, NAA (HOSP ORDER, SEND-OUT TO REF LAB; TAT 18-24 HRS): SARS-CoV-2, NAA: NOT DETECTED

## 2019-09-17 NOTE — Progress Notes (Signed)
Virtual Visit via Telephone Note Due to national recommendations of social distancing due to COVID 19, telehealth visit is felt to be most appropriate for this patient at this time.  I discussed the limitations, risks, security and privacy concerns of performing an evaluation and management service by telephone and the availability of in person appointments. I also discussed with the patient that there may be a patient responsible charge related to this service. The patient expressed understanding and agreed to proceed.    I connected with Angela Castillo on 09/17/19  at   9:50 AM EST  EDT by telephone and verified that I am speaking with the correct person using two identifiers.   Consent I discussed the limitations, risks, security and privacy concerns of performing an evaluation and management service by telephone and the availability of in person appointments. I also discussed with the patient that there may be a patient responsible charge related to this service. The patient expressed understanding and agreed to proceed.   Location of Patient: Private Residence   [facility]    Persons participating in Telemedicine visit: Bertram DenverZelda Teofila Bowery FNP-BC YY CantonBien CMA Angela ClaraMichelle A Calma    History of Present Illness: Telemedicine visit for: Follow up  has a past medical history of Anemia (Dx January 03 2015), Diabetes mellitus without complication (HCC), Elevated liver enzymes, Essential hypertension (Dx Apr 2016), H/O: upper GI bleed, Liver cirrhosis secondary to NASH (HCC), and Reflux.    Essential Hypertension Well controlled. She does not monitor her blood pressure at home as she does not have a monitor. Current medications include amlodipine 5 mg and losartan 100 mg daily. Denies chest pain, shortness of breath, palpitations, lightheadedness, dizziness, headaches or BLE edema. She is not dietary or exercise adherent.  Endorses  medication compliance taking amlodipine 5 mg daily and losartan 100 mg daily. BP Readings from Last 3 Encounters:  08/13/19 116/77  07/27/19 114/71  07/23/19 (!) 157/105    DM TYPE 2 Monitoring her blood glucose levels twice per day. Fasting Averages: highest 140s. Postprandial: 160s. Sometimes 200s but that is when she is eating high carb foods. She denies any hypo or hyperglycemic symptoms. She is UTD on her eye exam. Currently taking the following medications: metformin 1000 mg BID. Lab Results  Component Value Date   HGBA1C 5.4 06/25/2019    Past Medical History:  Diagnosis Date  . Anemia Dx January 03 2015  . Diabetes mellitus without complication (HCC)   . Elevated liver enzymes   . Essential hypertension Dx Apr 2016  . H/O: upper GI bleed   . Liver cirrhosis secondary to NASH (HCC)   . Reflux     Past Surgical History:  Procedure Laterality Date  . ESOPHAGEAL BANDING  03/22/2019   Procedure: ESOPHAGEAL BANDING;  Surgeon: Charlott RakesSchooler, Vincent, MD;  Location: [facility];  Service: Endoscopy;;  . ESOPHAGEAL BANDING  03/23/2019   Procedure: ESOPHAGEAL BANDING;  Surgeon: Charlott RakesSchooler, Vincent, MD;  Location: [facility];  Service: Endoscopy;;  . ESOPHAGOGASTRODUODENOSCOPY N/A 03/22/2019   Procedure: ESOPHAGOGASTRODUODENOSCOPY (EGD);  Surgeon: Charlott RakesSchooler, Vincent, MD;  [facility];  Service: Endoscopy;  Laterality: N/A;  . ESOPHAGOGASTRODUODENOSCOPY N/A 03/23/2019   Procedure: ESOPHAGOGASTRODUODENOSCOPY (EGD);  Surgeon: Charlott RakesSchooler, Vincent, MD;  [facility];  Service: Endoscopy;  Laterality: N/A;  . ESOPHAGOGASTRODUODENOSCOPY (EGD) WITH PROPOFOL N/A 07/23/2019   Procedure: ESOPHAGOGASTRODUODENOSCOPY (EGD) WITH PROPOFOL with possible banding;  Surgeon: Charlott RakesSchooler, Vincent, MD;  Location: [facility];  Service: Endoscopy;  Laterality:  N/A;  . TUBAL LIGATION  Jul 25, 2000    Family History  Problem Relation Age of Onset  . COPD Mother   . Esophageal cancer Mother   .  Diabetes Mother   . Cancer Mother   . Hypertension Mother   . Diabetes Father   . Breast cancer Neg Hx     Social History   Socioeconomic History  . Marital status: Married    Spouse name: Not on file  . Number of children: Not on file  . Years of education: Not on file  . Highest education level: Not on file  Occupational History  . Not on file  Social Needs  . Financial resource strain: Not on file  . Food insecurity    Worry: Not on file    Inability: Not on file  . Transportation needs    Medical: Not on file    Non-medical: Not on file  Tobacco Use  . Smoking status: Never Smoker  . Smokeless tobacco: Never Used  Substance and Sexual Activity  . Alcohol use: Yes    Comment: rarely-last alcohol drank in 08/2018  . Drug use: No  . Sexual activity: Yes  Lifestyle  . Physical activity    Days per week: Not on file    Minutes per session: Not on file  . Stress: Not on file  Relationships  . Social Musician on phone: Not on file    Gets together: Not on file    Attends religious service: Not on file    Active member of club or organization: Not on file    Attends meetings of clubs or organizations: Not on file    Relationship status: Not on file  Other Topics Concern  . Not on file  Social History Narrative  . Not on file     Observations/Objective: Awake, alert and oriented x 3   Review of Systems  Constitutional: Negative for fever, malaise/fatigue and weight loss.  HENT: Negative.  Negative for nosebleeds.   Eyes: Negative.  Negative for blurred vision, double vision and photophobia.  Respiratory: Negative.  Negative for cough and shortness of breath.   Cardiovascular: Negative.  Negative for chest pain, palpitations and leg swelling.  Gastrointestinal: Positive for heartburn. Negative for nausea and vomiting.  Musculoskeletal: Negative.  Negative for myalgias.  Neurological: Negative.  Negative for dizziness, focal weakness, seizures and  headaches.  Psychiatric/Behavioral: Negative.  Negative for suicidal ideas.    Assessment and Plan:  Diagnoses and all orders for this visit:  Essential hypertension Continue all antihypertensives as prescribed.  Remember to bring in your blood pressure log with you for your follow up appointment.  DASH/Mediterranean Diets are healthier choices for HTN.    Diabetes mellitus type 2, uncontrolled, with complications (HCC) Controlled Continue medications as prescribed.  Continue blood sugar control as discussed in office today, low carbohydrate diet, and regular physical exercise as tolerated, 150 minutes per week (30 min each day, 5 days per week, or 50 min 3 days per week). Keep blood sugar logs with fasting goal of 90-130 mg/dl, post prandial (after you eat) less than 180.  For Hypoglycemia: BS <60 and Hyperglycemia BS >400; contact the clinic ASAP. Annual eye exams and foot exams are recommended.   Gastroesophageal reflux disease INSTRUCTIONS: Avoid GERD Triggers: acidic, spicy or fried foods, caffeine, coffee, sodas,  alcohol and chocolate.     Follow Up Instructions Return in about 3 months (around 12/18/2019) for HTN/HPL/DM.  I discussed the assessment and treatment plan with the patient. The patient was provided an opportunity to ask questions and all were answered. The patient agreed with the plan and demonstrated an understanding of the instructions.   The patient was advised to call back or seek an in-person evaluation if the symptoms worsen or if the condition fails to improve as anticipated.  I provided 16 minutes of non-face-to-face time during this encounter including median intraservice time, reviewing previous notes, labs, imaging, medications and explaining diagnosis and management.  Gildardo Pounds, FNP-BC

## 2019-09-20 ENCOUNTER — Ambulatory Visit (HOSPITAL_COMMUNITY): Payer: BC Managed Care – PPO | Admitting: Registered Nurse

## 2019-09-20 ENCOUNTER — Other Ambulatory Visit: Payer: Self-pay

## 2019-09-20 ENCOUNTER — Encounter (HOSPITAL_COMMUNITY)
Admission: RE | Disposition: A | Discharge status: Home / Self Care | Payer: Self-pay | Source: Home / Self Care | Attending: Gastroenterology

## 2019-09-20 ENCOUNTER — Encounter (HOSPITAL_COMMUNITY): Payer: Self-pay

## 2019-09-20 ENCOUNTER — Ambulatory Visit (HOSPITAL_COMMUNITY)
Admission: RE | Admit: 2019-09-20 | Discharge: 2019-09-20 | Disposition: A | Discharge status: Home / Self Care | Payer: BC Managed Care – PPO | Attending: Gastroenterology | Admitting: Gastroenterology

## 2019-09-20 DIAGNOSIS — Z79899 Other long term (current) drug therapy: Secondary | ICD-10-CM | POA: Insufficient documentation

## 2019-09-20 DIAGNOSIS — K766 Portal hypertension: Secondary | ICD-10-CM | POA: Insufficient documentation

## 2019-09-20 DIAGNOSIS — E119 Type 2 diabetes mellitus without complications: Secondary | ICD-10-CM | POA: Diagnosis not present

## 2019-09-20 DIAGNOSIS — I8500 Esophageal varices without bleeding: Secondary | ICD-10-CM | POA: Diagnosis not present

## 2019-09-20 DIAGNOSIS — D509 Iron deficiency anemia, unspecified: Secondary | ICD-10-CM | POA: Diagnosis not present

## 2019-09-20 DIAGNOSIS — K7460 Unspecified cirrhosis of liver: Secondary | ICD-10-CM | POA: Diagnosis not present

## 2019-09-20 DIAGNOSIS — Z7984 Long term (current) use of oral hypoglycemic drugs: Secondary | ICD-10-CM | POA: Diagnosis not present

## 2019-09-20 DIAGNOSIS — I8511 Secondary esophageal varices with bleeding: Secondary | ICD-10-CM | POA: Diagnosis not present

## 2019-09-20 DIAGNOSIS — K219 Gastro-esophageal reflux disease without esophagitis: Secondary | ICD-10-CM | POA: Diagnosis not present

## 2019-09-20 DIAGNOSIS — K3189 Other diseases of stomach and duodenum: Secondary | ICD-10-CM | POA: Insufficient documentation

## 2019-09-20 DIAGNOSIS — K7581 Nonalcoholic steatohepatitis (NASH): Secondary | ICD-10-CM | POA: Insufficient documentation

## 2019-09-20 DIAGNOSIS — I10 Essential (primary) hypertension: Secondary | ICD-10-CM | POA: Insufficient documentation

## 2019-09-20 HISTORY — PX: ESOPHAGEAL BANDING: SHX5518

## 2019-09-20 HISTORY — PX: ESOPHAGOGASTRODUODENOSCOPY (EGD) WITH PROPOFOL: SHX5813

## 2019-09-20 LAB — GLUCOSE, CAPILLARY: Glucose-Capillary: 71 mg/dL (ref 70–99)

## 2019-09-20 SURGERY — ESOPHAGOGASTRODUODENOSCOPY (EGD) WITH PROPOFOL
Anesthesia: Monitor Anesthesia Care

## 2019-09-20 MED ORDER — SODIUM CHLORIDE 0.9 % IV SOLN: INTRAVENOUS | Status: DC

## 2019-09-20 MED ORDER — PROPOFOL 500 MG/50ML IV EMUL
INTRAVENOUS | Status: AC
  Filled 2019-09-20: qty 50

## 2019-09-20 MED ORDER — PROPOFOL 10 MG/ML IV BOLUS
INTRAVENOUS | Status: AC
  Filled 2019-09-20: qty 20

## 2019-09-20 MED ORDER — PROPOFOL 500 MG/50ML IV EMUL
INTRAVENOUS | Status: DC | PRN
  Administered 2019-09-20: 175 ug/kg/min via INTRAVENOUS

## 2019-09-20 MED ORDER — LIDOCAINE 2% (20 MG/ML) 5 ML SYRINGE
INTRAMUSCULAR | Status: DC | PRN
  Administered 2019-09-20: 100 mg via INTRAVENOUS

## 2019-09-20 MED ORDER — PROPOFOL 10 MG/ML IV BOLUS
INTRAVENOUS | Status: DC | PRN
  Administered 2019-09-20 (×2): 30 mg via INTRAVENOUS

## 2019-09-20 MED ORDER — LACTATED RINGERS IV SOLN
INTRAVENOUS | Status: DC
  Administered 2019-09-20: 10:00:00 1000 mL via INTRAVENOUS

## 2019-09-20 SURGICAL SUPPLY — 14 items

## 2019-09-20 NOTE — Anesthesia Postprocedure Evaluation (Signed)
Anesthesia Post Note  Patient: Angela Castillo  Procedure(s) Performed: ESOPHAGOGASTRODUODENOSCOPY (EGD) WITH PROPOFOL wih Banding (N/A )     Patient location during evaluation: Endoscopy Anesthesia Type: MAC Level of consciousness: awake and alert Pain management: pain level controlled Vital Signs Assessment: post-procedure vital signs reviewed and stable Respiratory status: spontaneous breathing, nonlabored ventilation and respiratory function stable Cardiovascular status: blood pressure returned to baseline and stable Postop Assessment: no apparent nausea or vomiting Anesthetic complications: no    Last Vitals:  Vitals:   09/20/19 1202 09/20/19 1219  BP: (!) 174/97 (!) 167/87  Pulse: 74 64  Resp: 16 12  Temp:    SpO2: 99% 99%    Last Pain:  Vitals:   09/20/19 1200  TempSrc:   PainSc: 4                  Lidia Collum

## 2019-09-20 NOTE — Discharge Instructions (Signed)
YOU HAD AN ENDOSCOPIC PROCEDURE TODAY: Refer to the procedure report and other information in the discharge instructions given to you for any specific questions about what was found during the examination. If this information does not answer your questions, please call [facility] office at [phone] to clarify.   YOU SHOULD EXPECT: Some feelings of bloating in the abdomen. Passage of more gas than usual. Walking can help get rid of the air that was put into your GI tract during the procedure and reduce the bloating. If you had a lower endoscopy (such as a colonoscopy or flexible sigmoidoscopy) you may notice spotting of blood in your stool or on the toilet paper. Some abdominal soreness may be present for a day or two, also.  DIET: Your first meal following the procedure should be a light meal and then it is ok to progress to your normal diet. A half-sandwich or bowl of soup is an example of a good first meal. Heavy or fried foods are harder to digest and may make you feel nauseous or bloated. Drink plenty of fluids but you should avoid alcoholic beverages for 24 hours. If you had a esophageal dilation, please see attached instructions for diet.   ACTIVITY: Your care partner should take you home directly after the procedure. You should plan to take it easy, moving slowly for the rest of the day. You can resume normal activity the day after the procedure however YOU SHOULD NOT DRIVE, use power tools, machinery or perform tasks that involve climbing or major physical exertion for 24 hours (because of the sedation medicines used during the test).   SYMPTOMS TO REPORT IMMEDIATELY: A gastroenterologist can be reached at any hour. Please call [phone]  for any of the following symptoms:   Following upper endoscopy (EGD, EUS, ERCP, esophageal dilation) Vomiting of blood or coffee ground material  New, significant abdominal pain  New, significant chest pain or pain under the shoulder blades  Painful or  persistently difficult swallowing  New shortness of breath  Black, tarry-looking or red, bloody stools  FOLLOW UP:  If any biopsies were taken you will be contacted by phone or by letter within the next 1-3 weeks. Call [phone]  if you have not heard about the biopsies in 3 weeks.  Please also call with any specific questions about appointments or follow up tests.      [facility] ENDOSCOPY [address] [place], [place]  [address] Phone:  [phone]   September 20, 2019  Patient: Angela Castillo  Date of Birth: 1976-08-29  Date of Visit: September 20, 2019    To Whom It May Concern:  Jannat Rosemeyer was seen and treated on September 20, 2019.           If you have any questions or concerns, please don't hesitate to call.   Sincerely,       Treatment Team:  Attending Provider: Wilford Corner, MD

## 2019-09-20 NOTE — Anesthesia Procedure Notes (Signed)
Date/Time: 09/20/2019 11:13 AM Performed by: Talbot Grumbling, CRNA Oxygen Delivery Method: Simple face mask

## 2019-09-20 NOTE — Op Note (Signed)
[facility] Patient Name: Angela Castillo Procedure Date: 09/20/2019 MRN: [id number] Attending MD: [name] , MD Date of Birth: 1976-07-05 CSN: [id number] Age: 43 Admit Type: Outpatient Procedure:                Upper GI endoscopy Indications:              2nd degree variceal eradication (following bleed) Providers:                [name], MD, Cleda Daub, RN, Ladona Ridgel, Technician Referring MD:             Rosita Kea Medicines:                Propofol per Anesthesia, Monitored Anesthesia Care Complications:            No immediate complications. Estimated Blood Loss:     Estimated blood loss: none. Procedure:                Pre-Anesthesia Assessment:                           - Prior to the procedure, a History and Physical                            was performed, and patient medications and                            allergies were reviewed. The patient's tolerance of                            previous anesthesia was also reviewed. The risks                            and benefits of the procedure and the sedation                            options and risks were discussed with the patient.                            All questions were answered, and informed consent                            was obtained. Prior Anticoagulants: The patient has                            taken no previous anticoagulant or antiplatelet                            agents. ASA Grade Assessment: III - A patient with                            severe systemic disease. After reviewing the risks  and benefits, the patient was deemed in                            satisfactory condition to undergo the procedure.                           After obtaining informed consent, the endoscope was                            passed under direct vision. Throughout the                            procedure, the  patient's blood pressure, pulse, and                            oxygen saturations were monitored continuously. The                            GIF-H190 ([id number] Olympus gastroscope was                            introduced through the mouth, and advanced to the                            second part of duodenum. The upper GI endoscopy was                            accomplished without difficulty. The patient                            tolerated the procedure well. Scope In: Scope Out: Findings:      Grade II varices were found in the distal esophagus. Four bands were       successfully placed with complete eradication, resulting in deflation of       varices. There was no bleeding during and at the end of the procedure.      The Z-line was found 42 cm from the incisors.      Mild portal hypertensive gastropathy was found in the gastric body.      The cardia and gastric fundus were normal on retroflexion.      The examined duodenum was normal. Impression:               - Grade II esophageal varices. Completely                            eradicated. Banded.                           - Z-line, 42 cm from the incisors.                           - Portal hypertensive gastropathy.                           - Normal examined duodenum.                           -  No specimens collected. Moderate Sedation:      Not Applicable - Patient had care per Anesthesia. Recommendation:           - Patient has a contact number available for                            emergencies. The signs and symptoms of potential                            delayed complications were discussed with the                            patient. Return to normal activities tomorrow.                            Written discharge instructions were provided to the                            patient.                           - Low sodium diet.                           - Return to my office as previously scheduled. Procedure  Code(s):        --- Professional ---                           [phone], Esophagogastroduodenoscopy, flexible,                            transoral; with band ligation of esophageal/gastric                            varices Diagnosis Code(s):        --- Professional ---                           I85.00, Esophageal varices without bleeding                           K76.6, Portal hypertension                           K31.89, Other diseases of stomach and duodenum CPT copyright 2019 American Medical Association. All rights reserved. The codes documented in this report are preliminary and upon coder review may  be revised to meet current compliance requirements. [name], MD 09/20/2019 11:48:54 AM This report has been signed electronically. Number of Addenda: 0

## 2019-09-20 NOTE — Transfer of Care (Signed)
Immediate Anesthesia Transfer of Care Note  Patient: Angela Castillo  Procedure(s) Performed: ESOPHAGOGASTRODUODENOSCOPY (EGD) WITH PROPOFOL wih Banding (N/A )  Patient Location: Endoscopy Unit  Anesthesia Type:MAC  Level of Consciousness: awake, alert , oriented and patient cooperative  Airway & Oxygen Therapy: Patient Spontanous Breathing and Patient connected to face mask oxygen  Post-op Assessment: Report given to RN, Post -op Vital signs reviewed and stable and Patient moving all extremities  Post vital signs: Reviewed and stable  Last Vitals:  Vitals Value Taken Time  BP    Temp    Pulse 90 09/20/19 1148  Resp 19 09/20/19 1148  SpO2 100 % 09/20/19 1148  Vitals shown include unvalidated device data.  Last Pain:  Vitals:   09/20/19 1003  TempSrc: Oral  PainSc: 0-No pain         Complications: No apparent anesthesia complications

## 2019-09-20 NOTE — Anesthesia Preprocedure Evaluation (Addendum)
Anesthesia Evaluation  Patient identified by MRN, date of birth, ID band Patient awake    Reviewed: Allergy & Precautions, NPO status , Patient's Chart, lab work & pertinent test results  History of Anesthesia Complications Negative for: history of anesthetic complications  Airway Mallampati: II  TM Distance: >3 FB Neck ROM: Full    Dental   Pulmonary neg pulmonary ROS,    Pulmonary exam normal        Cardiovascular hypertension, Normal cardiovascular exam     Neuro/Psych negative neurological ROS  negative psych ROS   GI/Hepatic GERD  ,(+) Cirrhosis  ([facility])  Esophageal Varices    ,   Endo/Other  diabetes  Renal/GU negative Renal ROS  negative genitourinary   Musculoskeletal negative musculoskeletal ROS (+)   Abdominal   Peds  Hematology negative hematology ROS (+)   Anesthesia Other Findings   Reproductive/Obstetrics                            Anesthesia Physical Anesthesia Plan  ASA: III  Anesthesia Plan: MAC   Post-op Pain Management:    Induction: Intravenous  PONV Risk Score and Plan: Propofol infusion, TIVA and Treatment may vary due to age or medical condition  Airway Management Planned: Natural Airway, Nasal Cannula and Simple Face Mask  Additional Equipment: None  Intra-op Plan:   Post-operative Plan:   Informed Consent: I have reviewed the patients History and Physical, chart, labs and discussed the procedure including the risks, benefits and alternatives for the proposed anesthesia with the patient or authorized representative who has indicated his/her understanding and acceptance.       Plan Discussed with:   Anesthesia Plan Comments:        Anesthesia Quick Evaluation

## 2019-09-20 NOTE — H&P (Signed)
Date of Initial H&P: 08/24/19  History reviewed, patient examined, no change in status, stable for surgery.

## 2019-09-20 NOTE — Interval H&P Note (Signed)
History and Physical Interval Note:  09/20/2019 10:21 AM  Angela Castillo  has presented today for surgery, with the diagnosis of Cirrhosis with varices.  The various methods of treatment have been discussed with the patient and family. After consideration of risks, benefits and other options for treatment, the patient has consented to  Procedure(s): ESOPHAGOGASTRODUODENOSCOPY (EGD) WITH PROPOFOL wih Banding (N/A) as a surgical intervention.  The patient's history has been reviewed, patient examined, no change in status, stable for surgery.  I have reviewed the patient's chart and labs.  Questions were answered to the patient's satisfaction.     [name]

## 2019-09-23 ENCOUNTER — Encounter (HOSPITAL_COMMUNITY): Payer: Self-pay | Admitting: Gastroenterology

## 2019-09-26 ENCOUNTER — Encounter: Payer: Self-pay | Admitting: Nurse Practitioner

## 2019-09-28 ENCOUNTER — Encounter: Payer: Self-pay | Admitting: Nurse Practitioner

## 2019-10-05 ENCOUNTER — Other Ambulatory Visit: Payer: Self-pay

## 2019-10-05 ENCOUNTER — Ambulatory Visit (INDEPENDENT_AMBULATORY_CARE_PROVIDER_SITE_OTHER): Payer: BC Managed Care – PPO | Admitting: Podiatry

## 2019-10-05 DIAGNOSIS — L600 Ingrowing nail: Secondary | ICD-10-CM

## 2019-10-05 DIAGNOSIS — L603 Nail dystrophy: Secondary | ICD-10-CM | POA: Diagnosis not present

## 2019-10-05 DIAGNOSIS — M79674 Pain in right toe(s): Secondary | ICD-10-CM | POA: Diagnosis not present

## 2019-10-05 NOTE — Patient Instructions (Signed)

## 2019-10-07 ENCOUNTER — Encounter: Payer: Self-pay | Admitting: Registered"

## 2019-10-07 ENCOUNTER — Encounter: Payer: BC Managed Care – PPO | Attending: Nurse Practitioner | Admitting: Registered"

## 2019-10-07 ENCOUNTER — Other Ambulatory Visit: Payer: Self-pay

## 2019-10-07 DIAGNOSIS — E1169 Type 2 diabetes mellitus with other specified complication: Secondary | ICD-10-CM | POA: Insufficient documentation

## 2019-10-07 DIAGNOSIS — E669 Obesity, unspecified: Secondary | ICD-10-CM | POA: Diagnosis not present

## 2019-10-07 NOTE — Patient Instructions (Addendum)
Instructions/Goals:  -Continue working to eat every 3-5 hours while awake. Try to avoid going more than 4-5 hours without having some carbohydrates paired with protein to help prevent lows.   -Recommend 2-3 carbohydrates choices per meal (30-45 g).   -Want to ensure each meal includes lean protein, carbohydrate (30-45 g), and non-starchy vegetables.   Try to include more water: ultimate goal at least 64 oz per day.   -Continue checking blood sugar as recommended by your doctor.   Make physical activity a part of your week. Try to include at least 30 minutes of physical activity 5 days each week or at least 150 minutes per week. Regular physical activity promotes overall health-including helping to reduce risk for heart disease and diabetes, promoting mental health, and helping [place] sleep better.  -Goal: Add 10 minutes of walking to your days and gradually work to 30 minutes most days. This can include indoors or outdoors.  Recommend consulting an allergist regarding egg concern. Recommend avoiding eating eggs unless baked in items (if still tolerated).

## 2019-10-07 NOTE — Progress Notes (Signed)
Diabetes Self-Management Education  Visit Type:    Appt. Start Time: 1517 Appt. End Time: 1478  10/07/2019  [name], identified by name and date of birth, is a 43 y.o. female with a diagnosis of Diabetes:  .   ASSESSMENT  Weight 217 lb 11.2 oz (98.7 kg). Body mass index is 36.79 kg/m.   Pt reports she is not taking her amlodipine medication currently due to having dizzy spells. Reports her doctor told her to discontinue right now until next visit which is tomorrow. Pt reports she has been checking blood sugar when feeling dizzy to ensure it is not due to low blood sugar. Pt reports having one low of 66. Pt reports reducing tea intake and diet drinks, has cut out most all soda. Reports soda now gives her a sour stomach. Reports she hasn't had any soda in past month. Pt reports she hasn't been drinking that much milk any longer, may include 1 cup a day. Reports cutting down on getting her morning coffee as well.   Pt reports her schedule has been busier than usual at work and it has been harder to get in snacks at regular times to avoid going too long without eating. Pt reports she will think out when she can work in her snack with new flow at work. Pt reports she is unsure how much water she is drinking, but knows she needs to drink more. Pt reports having some more GI discomfort/upset than previously but she feels it is due to drinking beverages and also eggs. Pt reports that she has suspected eggs of agitating her stomach for several years. Reports having "sour stomach," feeling, bloated, nausea, and often ends with diarrhea. Reports she used to be able to eat multiple eggs before having issues but now eating one egg causes issues. Reports no problem after eating baked goods containing eggs.   Pt reports getting in more walking at work than before. Pt does not report any additional physical activity apart from walking for work.   Blood Sugar Readings: Average fasting:  106 Average preprandial: 85 Average postprandial: 104 Reports having 2 under 70 (66)  24-Hour Recall: Work Day 330-4 AM: Cinnamon Che[employer] cereal, 1% milk  8 AM: None reported.  1010 AM: ravioli, sugar free pudding, water  1200: None reported.  1 PM: None reported.  530 PM: 10 pizza rolls, water 530PM: None reported.  Beverages: water  Individualized Plan for Diabetes Self-Management Training:  Learning Objective:  Patient will have a greater understanding of diabetes self-management. Patient education plan is to attend individual and/or group sessions per assessed needs and concerns.   Intervention: Praised pt's blood sugar readings and positive changes. Discussed working to have consistent eating pattern and avoiding going more than 4-5 hours without eating any type of carbohydrate to help prevent lows. Discussed working to have protein, carbohydrates, and vegetables at meals. Recommended avoiding eating eggs which pt feels have been causing GI problems. Encouraged pt to consult an allergy regarding possible egg allergy. Discussed that sometimes with egg allergy individuals may tolerate eggs when baked in product but not if cooked other ways due to lower cooking temperatures and cooking times. Recommended working to increase water intake. Pt appeared agreeable to information/goals discussed.   Instructions/Goals:  -Continue working to eat every 3-5 hours while awake. Try to avoid going more than 4-5 hours without having some carbohydrates paired with protein to help prevent lows.   -Recommend 2-3 carbohydrates choices per meal (30-45 g).   -  Want to ensure each meal includes lean protein, carbohydrate (30-45 g), and non-starchy vegetables.   Try to include more water: ultimate goal at least 64 oz per day.   -Continue checking blood sugar as recommended by your doctor.   Make physical activity a part of your week. Try to include at least 30 minutes of physical activity 5 days each  week or at least 150 minutes per week. Regular physical activity promotes overall health-including helping to reduce risk for heart disease and diabetes, promoting mental health, and helping [place] sleep better.  -Goal: Add 10 minutes of walking to your days and gradually work to 30 minutes most days. This can include indoors or outdoors.  Recommend consulting an allergist regarding egg concern. Recommend avoiding eating eggs unless baked in items (if still tolerated).    Patient Instructions  Instructions/Goals:  -Continue working to eat every 3-5 hours while awake. Try to avoid going more than 4-5 hours without having some carbohydrates paired with protein to help prevent lows.   -Recommend 2-3 carbohydrates choices per meal (30-45 g).   -Want to ensure each meal includes lean protein, carbohydrate (30-45 g), and non-starchy vegetables.   Try to include more water: ultimate goal at least 64 oz per day.   -Continue checking blood sugar as recommended by your doctor.   Make physical activity a part of your week. Try to include at least 30 minutes of physical activity 5 days each week or at least 150 minutes per week. Regular physical activity promotes overall health-including helping to reduce risk for heart disease and diabetes, promoting mental health, and helping [place] sleep better.  -Goal: Add 10 minutes of walking to your days and gradually work to 30 minutes most days. This can include indoors or outdoors.  Recommend consulting an allergist regarding egg concern. Recommend avoiding eating eggs unless baked in items (if still tolerated).    If problems or questions, patient to contact team via:  Phone and Email  Future DSME appointment:  1 month

## 2019-10-08 ENCOUNTER — Ambulatory Visit: Payer: BC Managed Care – PPO | Attending: Nurse Practitioner

## 2019-10-08 DIAGNOSIS — I10 Essential (primary) hypertension: Secondary | ICD-10-CM

## 2019-10-08 NOTE — Progress Notes (Signed)
Patient arrived at clinic for orthostatic vitals. Vital were relayed to PCP.

## 2019-10-12 ENCOUNTER — Encounter: Payer: Self-pay | Admitting: Nurse Practitioner

## 2019-10-16 DIAGNOSIS — L600 Ingrowing nail: Secondary | ICD-10-CM | POA: Insufficient documentation

## 2019-10-16 NOTE — Progress Notes (Signed)
Subjective: 43 year old female presents the office today for concerns of her right big toenail growing back becoming ingrown, thickened discolored.  She has had several issues with this toenail.  Denies any drainage or pus coming from.  Denies any swelling or redness at this time. Denies any systemic complaints such as fevers, chills, nausea, vomiting. No acute changes since last appointment, and no other complaints at this time.   Objective: AAO x3, NAD DP/PT pulses palpable bilaterally, CRT less than 3 seconds Protective sensation intact with Simms Weinstein monofilament, vibratory  The right hallux toenail significantly hypertrophic, dystrophic with brown discoloration and incurvation of both nail borders.  There is no edema, erythema, drainage or pus. No pain with calf compression, swelling, warmth, erythema  Assessment: Right hallux onychodystrophy, ingrown toenail  Plan: -All treatment options discussed with the patient including all alternatives, risks, complications.  -At this time we discussed treatment options.  After discussion she elected proceed with total nail avulsion with chemical matricectomy.  Risks and complications were discussed with the patient for which they understand and written consent was obtained. Under sterile conditions a total of 3 mL of a mixture of 2% lidocaine plain and 0.5% Marcaine plain was infiltrated in a hallux block fashion. Once anesthetized, the skin was prepped in sterile fashion. A tourniquet was then applied. Next the right hallux nail was then excised making sure to remove the entire offending nail border. Once the nails were ensured to be removed area was debrided and the underlying skin was intact. There is no purulence identified in the procedure. Next phenol was then applied under standard conditions and copiously irrigated. Silvadene was applied. A dry sterile dressing was applied. After application of the dressing the tourniquet was removed and there  is found to be an immediate capillary refill time to the digit. The patient tolerated the procedure well any complications. Post procedure instructions were discussed the patient for which he verbally understood. Follow-up in one week for nail check or sooner if any problems are to arise. Discussed signs/symptoms of infection and directed to call the office immediately should any occur or go directly to the emergency room. In the meantime, encouraged to call the office with any questions, concerns, changes symptoms. -Patient encouraged to call the office with any questions, concerns, change in symptoms.    Trula Slade DPM

## 2019-10-18 ENCOUNTER — Ambulatory Visit: Payer: BC Managed Care – PPO | Admitting: Podiatry

## 2019-10-19 ENCOUNTER — Ambulatory Visit (INDEPENDENT_AMBULATORY_CARE_PROVIDER_SITE_OTHER): Payer: BC Managed Care – PPO | Admitting: Podiatry

## 2019-10-19 ENCOUNTER — Other Ambulatory Visit: Payer: Self-pay

## 2019-10-19 DIAGNOSIS — L600 Ingrowing nail: Secondary | ICD-10-CM

## 2019-10-19 NOTE — Patient Instructions (Signed)

## 2019-10-20 NOTE — Progress Notes (Signed)
Subjective: Angela Castillo is a 43 y.o.  female returns to office today for follow up evaluation after having right Hallux total permanent nail avulsion performed. Patient has been soaking using Epsom salts and applying topical antibiotic covered with bandaid daily.  She says that healing nicely she denies any issues.  Patient denies fevers, chills, nausea, vomiting. Denies any calf pain, chest pain, SOB.   Objective:  Vitals: Reviewed  General: Well developed, nourished, in no acute distress, alert and oriented x3   Dermatology: Skin is warm, dry and supple bilateral. Right hallux nail bedappears to be clean, dry, with mild granular tissue and surrounding scab. There is no surrounding erythema, edema, drainage/purulence. The remaining nails appear unremarkable at this time. There are no other lesions or other signs of infection present.  Neurovascular status: Intact. No lower extremity swelling; No pain with calf compression bilateral.  Musculoskeletal: Decreased tenderness to palpation of the right hallux nail bed. Muscular strength within normal limits bilateral.   Assesement and Plan: S/p partial nail avulsion, doing well.   -Continue soaking in epsom salts twice a day followed by antibiotic ointment and a band-aid. Can leave uncovered at night. Continue this until completely healed.  -If the area has not healed in 2 weeks, call the office for follow-up appointment, or sooner if any problems arise.  -Monitor for any signs/symptoms of infection. Call the office immediately if any occur or go directly to the emergency room. Call with any questions/concerns.  Celesta Gentile, DPM

## 2019-11-10 ENCOUNTER — Ambulatory Visit: Payer: BC Managed Care – PPO | Admitting: Registered"

## 2019-11-17 ENCOUNTER — Other Ambulatory Visit: Payer: Self-pay | Admitting: Nurse Practitioner

## 2019-11-17 DIAGNOSIS — K219 Gastro-esophageal reflux disease without esophagitis: Secondary | ICD-10-CM

## 2019-11-29 ENCOUNTER — Other Ambulatory Visit: Payer: Self-pay

## 2019-11-29 ENCOUNTER — Encounter: Payer: Self-pay | Admitting: Registered"

## 2019-11-29 ENCOUNTER — Encounter: Payer: BC Managed Care – PPO | Attending: Nurse Practitioner | Admitting: Registered"

## 2019-11-29 DIAGNOSIS — E1169 Type 2 diabetes mellitus with other specified complication: Secondary | ICD-10-CM | POA: Insufficient documentation

## 2019-11-29 DIAGNOSIS — E669 Obesity, unspecified: Secondary | ICD-10-CM | POA: Insufficient documentation

## 2019-11-29 NOTE — Patient Instructions (Signed)
Instructions/Goals:  -Continue working to eat every 3-5 hours while awake.   -Try to avoid going more than 4-5 hours without having some carbohydrates paired with protein to help prevent lows.   -Recommend 2-3 carbohydrates choices per meal (30-45 g).   -Want to ensure each meal includes lean protein, carbohydrate (30-45 g), and non-starchy vegetables.   -Try to include more water: ultimate goal at least 64 oz per day. Recommend trying an insulated water bottle  -Recommend setting reminders to check blood sugar fasting in morning and once 1-2 hours after a meal   Make physical activity a part of your week. Try to include at least 30 minutes of physical activity 5 days each week or at least 150 minutes per week. Regular physical activity promotes overall health-including helping to reduce risk for heart disease and diabetes, promoting mental health, and helping [place] sleep better.  -Goal: Add 10 minutes of walking to your days and gradually work to 30 minutes most days. This can include indoors or outdoors.  Recommend consulting an allergist regarding egg concern. Recommend avoiding eating eggs unless baked in items (if still tolerated).

## 2019-11-29 NOTE — Progress Notes (Signed)
Diabetes Self-Management Education  Visit Type:    Appt. Start Time: 1705 Appt. End Time: 2947  11/29/2019  [name], identified by name and date of birth, is a 44 y.o. female with a diagnosis of Diabetes:  .   ASSESSMENT  Weight 219 lb 8 oz (99.6 kg). Body mass index is 37.1 kg/m.   Pt reports she has somewhat fallen off her usual path. Pt reports having some more sugar in diet than before. Reports she is still avoiding having sodas, has occasional sweet tea. Reports not getting the sweetest of sweet teas. Reports [employer] has best unsweet tea. Reports taking her water bottle to work. Reports occasionally gets a coffee with flavored creamer. Reports she has been forgetting to check sugar as often as she was before. Reports she hasn't had any extreme lows or highs. Lowest has been right under 70 (1-2 times). Reports having more sweets than she probably should per pt. Reports she does not want to go back to where she was before she started working on lifestyle changes. Pt does not want to gain weight. Reports she wants to get back to where she was when she was doing well with nutrition. Reports trying to continue to watch portions. Highest reported blood sugar around 130-140s. Denies any readings out of range. Reports no fastings over 110. Reports she hasn't been taking her snacks as usual to work. May get peanut butter crackers from machine if she gets hungry or baked chips but not regularly. Reports some concerns she may not be getting enough water (darker urine, possible pains around kidney area).   Reports she hasn't yet gotten to check with dr regarding possible egg allergy concern. Reports she has been avoiding eggs.   Reports laziness as reason for getting off routine. When asked, pt reports increased stress at work resulting in feeling more tired as well which may be contributing.   Blood Sugar Readings: Average fasting: reports none above 110 Average postprandial: reports  most around 130-140, none near 180 Reports having 1-2 under 70 (~68)  24-Hour Recall: Work Day 330 AM: [employer] Cereal with 1% milk 8 AM: None reported. Reports if she gets hungry she will get peanut butter crackers 1000 AM: single pepperoni pizza (may do this 1-2 times per week), water  1200: None reported.  1 PM: None reported.  4-415 PM: pasta in bread bowl (half of plate portion), (Chicken [employer] at [employer]), 1% milk  530PM: None reported.  Beverages: 24-32 oz water, sometimes more water, 1 cup milk   Individualized Plan for Diabetes Self-Management Training:  Learning Objective:  Patient will have a greater understanding of diabetes self-management. Patient education plan is to attend individual and/or group sessions per assessed needs and concerns.   Intervention: Discussed working to get back in routine of checking blood sugar and packing food and snacks for work. Discussed with pt that it is good she has realized her slips and wants to work to get back on track as that is a great first step to continuing to work to maintain healthy lifestyle changes. Discussed setting reminders to check blood sugar on phone. Pt appeared agreeable to information/goals discussed.   Instructions/Goals:  -Continue working to eat every 3-5 hours while awake.   -Try to avoid going more than 4-5 hours without having some carbohydrates paired with protein to help prevent lows.   -Recommend 2-3 carbohydrates choices per meal (30-45 g).   -Want to ensure each meal includes lean protein, carbohydrate (30-45 g),  and non-starchy vegetables.   -Try to include more water: ultimate goal at least 64 oz per day. Recommend trying an insulated water bottle  -Recommend setting reminders to check blood sugar fasting in morning and once 1-2 hours after a meal   Make physical activity a part of your week. Try to include at least 30 minutes of physical activity 5 days each week or at least 150 minutes per  week. Regular physical activity promotes overall health-including helping to reduce risk for heart disease and diabetes, promoting mental health, and helping [place] sleep better.  -Goal: Add 10 minutes of walking to your days and gradually work to 30 minutes most days. This can include indoors or outdoors.  Recommend consulting an allergist regarding egg concern. Recommend avoiding eating eggs unless baked in items (if still tolerated).    Patient Instructions  Instructions/Goals:  -Continue working to eat every 3-5 hours while awake.   -Try to avoid going more than 4-5 hours without having some carbohydrates paired with protein to help prevent lows.   -Recommend 2-3 carbohydrates choices per meal (30-45 g).   -Want to ensure each meal includes lean protein, carbohydrate (30-45 g), and non-starchy vegetables.   -Try to include more water: ultimate goal at least 64 oz per day. Recommend trying an insulated water bottle  -Recommend setting reminders to check blood sugar fasting in morning and once 1-2 hours after a meal   Make physical activity a part of your week. Try to include at least 30 minutes of physical activity 5 days each week or at least 150 minutes per week. Regular physical activity promotes overall health-including helping to reduce risk for heart disease and diabetes, promoting mental health, and helping [place] sleep better.  -Goal: Add 10 minutes of walking to your days and gradually work to 30 minutes most days. This can include indoors or outdoors.  Recommend consulting an allergist regarding egg concern. Recommend avoiding eating eggs unless baked in items (if still tolerated).   If problems or questions, patient to contact team via:  Phone and Email  Future DSME appointment:  2 month Pt requested trying 2 months this time.

## 2019-12-07 ENCOUNTER — Other Ambulatory Visit: Payer: Self-pay | Admitting: Gastroenterology

## 2019-12-08 ENCOUNTER — Other Ambulatory Visit: Payer: Self-pay | Admitting: Nurse Practitioner

## 2019-12-08 DIAGNOSIS — K219 Gastro-esophageal reflux disease without esophagitis: Secondary | ICD-10-CM

## 2019-12-14 ENCOUNTER — Encounter (HOSPITAL_COMMUNITY): Payer: Self-pay | Admitting: Gastroenterology

## 2019-12-14 DIAGNOSIS — K7460 Unspecified cirrhosis of liver: Secondary | ICD-10-CM | POA: Diagnosis not present

## 2019-12-15 ENCOUNTER — Encounter (HOSPITAL_COMMUNITY): Payer: Self-pay | Admitting: Gastroenterology

## 2019-12-15 NOTE — Progress Notes (Signed)
Attempted to obtain medical history via telephone, unable to reach at this time. I left a voicemail to return pre surgical testing department's phone call.  

## 2019-12-17 ENCOUNTER — Other Ambulatory Visit (HOSPITAL_COMMUNITY)
Admission: RE | Admit: 2019-12-17 | Discharge: 2019-12-17 | Disposition: A | Discharge status: Home / Self Care | Payer: BC Managed Care – PPO | Source: Ambulatory Visit | Attending: Gastroenterology | Admitting: Gastroenterology

## 2019-12-17 ENCOUNTER — Other Ambulatory Visit: Payer: Self-pay | Admitting: Gastroenterology

## 2019-12-17 DIAGNOSIS — Z20822 Contact with and (suspected) exposure to covid-19: Secondary | ICD-10-CM | POA: Insufficient documentation

## 2019-12-17 DIAGNOSIS — Z01812 Encounter for preprocedural laboratory examination: Secondary | ICD-10-CM | POA: Diagnosis not present

## 2019-12-17 LAB — SARS CORONAVIRUS 2 (TAT 6-24 HRS): SARS Coronavirus 2: NEGATIVE

## 2019-12-19 ENCOUNTER — Encounter: Payer: Self-pay | Admitting: Nurse Practitioner

## 2019-12-19 ENCOUNTER — Other Ambulatory Visit: Payer: Self-pay | Admitting: Nurse Practitioner

## 2019-12-19 MED ORDER — NORGESTIM-ETH ESTRAD TRIPHASIC 0.18/0.215/0.25 MG-35 MCG PO TABS: 1.0000 | ORAL_TABLET | Freq: Every day | ORAL | 11 refills | Status: DC

## 2019-12-21 ENCOUNTER — Ambulatory Visit (HOSPITAL_COMMUNITY)
Admission: RE | Admit: 2019-12-21 | Discharge: 2019-12-21 | Disposition: A | Discharge status: Home / Self Care | Payer: BC Managed Care – PPO | Attending: Gastroenterology | Admitting: Gastroenterology

## 2019-12-21 ENCOUNTER — Encounter (HOSPITAL_COMMUNITY): Payer: Self-pay | Admitting: Gastroenterology

## 2019-12-21 ENCOUNTER — Ambulatory Visit (HOSPITAL_COMMUNITY): Payer: BC Managed Care – PPO | Admitting: Registered Nurse

## 2019-12-21 ENCOUNTER — Other Ambulatory Visit: Payer: Self-pay

## 2019-12-21 ENCOUNTER — Encounter (HOSPITAL_COMMUNITY)
Admission: RE | Disposition: A | Discharge status: Home / Self Care | Payer: Self-pay | Source: Home / Self Care | Attending: Gastroenterology

## 2019-12-21 DIAGNOSIS — Z7984 Long term (current) use of oral hypoglycemic drugs: Secondary | ICD-10-CM | POA: Insufficient documentation

## 2019-12-21 DIAGNOSIS — K7460 Unspecified cirrhosis of liver: Secondary | ICD-10-CM | POA: Insufficient documentation

## 2019-12-21 DIAGNOSIS — K219 Gastro-esophageal reflux disease without esophagitis: Secondary | ICD-10-CM | POA: Insufficient documentation

## 2019-12-21 DIAGNOSIS — Z79899 Other long term (current) drug therapy: Secondary | ICD-10-CM | POA: Diagnosis not present

## 2019-12-21 DIAGNOSIS — K7581 Nonalcoholic steatohepatitis (NASH): Secondary | ICD-10-CM | POA: Diagnosis not present

## 2019-12-21 DIAGNOSIS — I10 Essential (primary) hypertension: Secondary | ICD-10-CM | POA: Diagnosis not present

## 2019-12-21 DIAGNOSIS — E119 Type 2 diabetes mellitus without complications: Secondary | ICD-10-CM | POA: Insufficient documentation

## 2019-12-21 DIAGNOSIS — Z886 Allergy status to analgesic agent status: Secondary | ICD-10-CM | POA: Insufficient documentation

## 2019-12-21 DIAGNOSIS — I8500 Esophageal varices without bleeding: Secondary | ICD-10-CM | POA: Insufficient documentation

## 2019-12-21 HISTORY — DX: Thrombocytopenia, unspecified: D69.6

## 2019-12-21 HISTORY — PX: ESOPHAGOGASTRODUODENOSCOPY (EGD) WITH PROPOFOL: SHX5813

## 2019-12-21 HISTORY — DX: Fatty (change of) liver, not elsewhere classified: K76.0

## 2019-12-21 HISTORY — PX: ESOPHAGEAL BANDING: SHX5518

## 2019-12-21 LAB — PREGNANCY, URINE: Preg Test, Ur: NEGATIVE

## 2019-12-21 LAB — GLUCOSE, CAPILLARY: Glucose-Capillary: 83 mg/dL (ref 70–99)

## 2019-12-21 SURGERY — ESOPHAGOGASTRODUODENOSCOPY (EGD) WITH PROPOFOL
Anesthesia: Monitor Anesthesia Care

## 2019-12-21 MED ORDER — SODIUM CHLORIDE 0.9 % IV SOLN: INTRAVENOUS | Status: DC

## 2019-12-21 MED ORDER — PROPOFOL 10 MG/ML IV BOLUS
INTRAVENOUS | Status: DC | PRN
  Administered 2019-12-21: 20 mg via INTRAVENOUS
  Administered 2019-12-21: 30 mg via INTRAVENOUS

## 2019-12-21 MED ORDER — PROPOFOL 10 MG/ML IV BOLUS
INTRAVENOUS | Status: AC
  Filled 2019-12-21: qty 20

## 2019-12-21 MED ORDER — LABETALOL HCL 5 MG/ML IV SOLN
INTRAVENOUS | Status: AC
  Filled 2019-12-21: qty 4

## 2019-12-21 MED ORDER — LACTATED RINGERS IV SOLN
INTRAVENOUS | Status: DC
  Administered 2019-12-21: 1000 mL via INTRAVENOUS

## 2019-12-21 MED ORDER — PROPOFOL 500 MG/50ML IV EMUL
INTRAVENOUS | Status: DC | PRN
  Administered 2019-12-21: 150 ug/kg/min via INTRAVENOUS

## 2019-12-21 MED ORDER — LABETALOL HCL 5 MG/ML IV SOLN: 5.0000 mg | INTRAVENOUS | Status: DC | PRN

## 2019-12-21 MED ORDER — PROPOFOL 500 MG/50ML IV EMUL
INTRAVENOUS | Status: AC
  Filled 2019-12-21: qty 50

## 2019-12-21 SURGICAL SUPPLY — 14 items

## 2019-12-21 NOTE — Interval H&P Note (Signed)
History and Physical Interval Note:  12/21/2019 9:07 AM  Angela Castillo  has presented today for surgery, with the diagnosis of Hepatic cirrhosis.  The various methods of treatment have been discussed with the patient and family. After consideration of risks, benefits and other options for treatment, the patient has consented to  Procedure(s): ESOPHAGOGASTRODUODENOSCOPY (EGD) WITH PROPOFOL with possible banding (N/A) POSSIBLE ESOPHAGEAL BANDING (N/A) as a surgical intervention.  The patient's history has been reviewed, patient examined, no change in status, stable for surgery.  I have reviewed the patient's chart and labs.  Questions were answered to the patient's satisfaction.     [name]

## 2019-12-21 NOTE — Addendum Note (Signed)
Addendum  created 12/21/19 1014 by Annye Asa, MD   Order list changed

## 2019-12-21 NOTE — Op Note (Signed)
[facility] Patient Name: Angela Castillo Procedure Date: 12/21/2019 MRN: [id number] Attending MD: [name] , MD Date of Birth: Mar 31, 1976 CSN: [id number] Age: 44 Admit Type: Outpatient Procedure:                Upper GI endoscopy Indications:              2nd degree variceal eradication (following bleed) Providers:                [name], MD, Kary Kos, RN, Cleda Daub, RN, Theodora Blow, Technician Referring MD:             Rosita Kea Medicines:                Propofol per Anesthesia, Monitored Anesthesia Care Complications:            No immediate complications. Estimated Blood Loss:     Estimated blood loss: none. Procedure:                Pre-Anesthesia Assessment:                           - Prior to the procedure, a History and Physical                            was performed, and patient medications and                            allergies were reviewed. The patient's tolerance of                            previous anesthesia was also reviewed. The risks                            and benefits of the procedure and the sedation                            options and risks were discussed with the patient.                            All questions were answered, and informed consent                            was obtained. Prior Anticoagulants: The patient has                            taken no previous anticoagulant or antiplatelet                            agents. ASA Grade Assessment: III - A patient with                            severe systemic disease. After reviewing the risks  and benefits, the patient was deemed in                            satisfactory condition to undergo the procedure.                           After obtaining informed consent, the endoscope was                            passed under direct vision. Throughout the   procedure, the patient's blood pressure, pulse, and                            oxygen saturations were monitored continuously. The                            [id number]-H190 ([id number] Olympus gastroscope was                            introduced through the mouth, and advanced to the                            second part of duodenum. The upper GI endoscopy was                            accomplished without difficulty. The patient                            tolerated the procedure well. Scope In: Scope Out: Findings:      Grade II varices were found in the distal esophagus. Three bands were       successfully placed with complete eradication, resulting in deflation of       varices. There was no bleeding during and at the end of the procedure.      The Z-line was regular and was found 40 cm from the incisors.      The entire examined stomach was normal.      The cardia and gastric fundus were normal on retroflexion.      The examined duodenum was normal. Impression:               - Grade II esophageal varices. Completely                            eradicated. Banded.                           - Z-line regular, 40 cm from the incisors.                           - Normal stomach.                           - Normal examined duodenum.                           - No specimens collected. Moderate Sedation:  Not Applicable - Patient had care per Anesthesia. Recommendation:           - Patient has a contact number available for                            emergencies. The signs and symptoms of potential                            delayed complications were discussed with the                            patient. Return to normal activities tomorrow.                            Written discharge instructions were provided to the                            patient.                           - Low sodium diet.                           - Return to my office as previously scheduled. Procedure Code(s):         --- Professional ---                           [phone], Esophagogastroduodenoscopy, flexible,                            transoral; with band ligation of esophageal/gastric                            varices Diagnosis Code(s):        --- Professional ---                           I85.00, Esophageal varices without bleeding CPT copyright 2019 [employer]. All rights reserved. The codes documented in this report are preliminary and upon coder review may  be revised to meet current compliance requirements. [name], MD 12/21/2019 9:44:10 AM This report has been signed electronically. Number of Addenda: 0

## 2019-12-21 NOTE — Discharge Instructions (Signed)
YOU HAD AN ENDOSCOPIC PROCEDURE TODAY: Refer to the procedure report and other information in the discharge instructions given to you for any specific questions about what was found during the examination. If this information does not answer your questions, please call [facility] office at [phone] to clarify.   YOU SHOULD EXPECT: Some feelings of bloating in the abdomen. Passage of more gas than usual. Walking can help get rid of the air that was put into your GI tract during the procedure and reduce the bloating. If you had a lower endoscopy (such as a colonoscopy or flexible sigmoidoscopy) you may notice spotting of blood in your stool or on the toilet paper. Some abdominal soreness may be present for a day or two, also.  DIET: Your first meal following the procedure should be a light meal and then it is ok to progress to your normal diet. A half-sandwich or bowl of soup is an example of a good first meal. Heavy or fried foods are harder to digest and may make you feel nauseous or bloated. Drink plenty of fluids but you should avoid alcoholic beverages for 24 hours. If you had a esophageal dilation, please see attached instructions for diet.    ACTIVITY: Your care partner should take you home directly after the procedure. You should plan to take it easy, moving slowly for the rest of the day. You can resume normal activity the day after the procedure however YOU SHOULD NOT DRIVE, use power tools, machinery or perform tasks that involve climbing or major physical exertion for 24 hours (because of the sedation medicines used during the test).   SYMPTOMS TO REPORT IMMEDIATELY: A gastroenterologist can be reached at any hour. Please call [phone]  for any of the following symptoms:  . Following upper endoscopy (EGD, EUS, ERCP, esophageal dilation) Vomiting of blood or coffee ground material  New, significant abdominal pain  New, significant chest pain or pain under the shoulder blades  Painful or  persistently difficult swallowing  New shortness of breath  Black, tarry-looking or red, bloody stools  FOLLOW UP:  If any biopsies were taken you will be contacted by phone or by letter within the next 1-3 weeks. Call [phone]  if you have not heard about the biopsies in 3 weeks.  Please also call with any specific questions about appointments or follow up tests.   Esophageal Variceal Ligation, Care After This sheet gives you information about how to care for yourself after your procedure. Your doctor may also give you more specific instructions. If you have problems or questions, contact your doctor. What can I expect after the procedure? After the procedure, it is common to have:  Bleeding.  Pain and soreness in your chest area.  Trouble swallowing. Follow these instructions at home:  Eating and drinking Follow instructions from your doctor about what you can eat or drink.  You will have limits on what you can eat for the first 1-2 days after your procedure.  You will start with a liquid diet. Later, you will start to eat soft foods.  Do not drink alcohol.  Activity  Return to your normal activities as told by your doctor. Ask your doctor what activities are safe for you.  Do not lift anything that is heavier than 10 lb (4.5 kg), or the limit that you are told, until your doctor says that it is safe.  Do not drive or use heavy machinery while taking prescription pain medicine. General instructions  Take over-the-counter  and prescription medicines only as told by your doctor.  Do not use any products that contain nicotine or tobacco, such as cigarettes and e-cigarettes. If you need help quitting, ask your doctor.  Keep all follow-up visits as told by your doctor. This is important. Contact a doctor if:  You have chest pain that lasts for more than 3 days after you go home.  You have trouble swallowing that lasts for more than 3 days after you go home.  You have  a fever or chills. Get help right away if:  You have bleeding from your throat.  You have bleeding from your bottom (rectum).  You throw up (vomit) bright red blood.  You are unable to swallow.  You are short of breath.  You have very bad chest pain or back pain. Summary  After the procedure, it is common to have pain, bleeding, and trouble swallowing.  Follow all your home care instructions.  Stay on a liquid or soft diet until your doctor says that you can go back to your normal diet.  Contact a doctor if you have chills, fever, chest pain, or trouble swallowing.  Get help right away if you have bleeding, are unable to swallow, or have very bad chest or back pain. This information is not intended to replace advice given to you by your health care provider. Make sure you discuss any questions you have with your health care provider. Document Revised: 01/27/2018 Document Reviewed: 01/27/2018 Elsevier Patient Education  2020 [employer].  Please excuse Natalynn Pedone  from work on 10/19/2020 due to them having an outpatient procedure. They can return to work on 12/22/2019.   Thanks,    Dr. Wilford Corner  ____________________________

## 2019-12-21 NOTE — Anesthesia Preprocedure Evaluation (Signed)
Anesthesia Evaluation  Patient identified by MRN, date of birth, ID band Patient awake    Reviewed: Allergy & Precautions, NPO status , Patient's Chart, lab work & pertinent test results, reviewed documented beta blocker date and time   History of Anesthesia Complications Negative for: history of anesthetic complications  Airway Mallampati: II  TM Distance: >3 FB Neck ROM: Full    Dental  (+) Dental Advisory Given   Pulmonary neg pulmonary ROS,  12/17/2019 SARS coronavirus NEG   breath sounds clear to auscultation       Cardiovascular hypertension, Pt. on medications and Pt. on home beta blockers (-) angina Rhythm:Regular Rate:Normal     Neuro/Psych negative neurological ROS     GI/Hepatic GERD  Controlled and Medicated,(+) Cirrhosis  ([facility])  Esophageal Varices and ascites    , Hepatitis -  Endo/Other  diabetes (glu 83), Oral Hypoglycemic AgentsMorbid obesity  Renal/GU      Musculoskeletal   Abdominal (+) + obese,   Peds  Hematology   Anesthesia Other Findings   Reproductive/Obstetrics S/p BTL                             Anesthesia Physical Anesthesia Plan  ASA: III  Anesthesia Plan: MAC   Post-op Pain Management:    Induction:   PONV Risk Score and Plan: 2 and Treatment may vary due to age or medical condition and Ondansetron  Airway Management Planned: Natural Airway and Nasal Cannula  Additional Equipment:   Intra-op Plan:   Post-operative Plan:   Informed Consent: I have reviewed the patients History and Physical, chart, labs and discussed the procedure including the risks, benefits and alternatives for the proposed anesthesia with the patient or authorized representative who has indicated his/her understanding and acceptance.     Dental advisory given  Plan Discussed with: CRNA and Surgeon  Anesthesia Plan Comments:         Anesthesia Quick Evaluation

## 2019-12-21 NOTE — Anesthesia Postprocedure Evaluation (Signed)
Anesthesia Post Note  Patient: MADELON WELSCH  Procedure(s) Performed: ESOPHAGOGASTRODUODENOSCOPY (EGD) WITH PROPOFOL with possible banding (N/A ) POSSIBLE ESOPHAGEAL BANDING (N/A )     Patient location during evaluation: Endoscopy Anesthesia Type: MAC Level of consciousness: awake and alert, patient cooperative and oriented Pain management: pain level controlled Vital Signs Assessment: post-procedure vital signs reviewed and stable Respiratory status: spontaneous breathing, respiratory function stable and nonlabored ventilation Cardiovascular status: stable and blood pressure returned to baseline Postop Assessment: no apparent nausea or vomiting Anesthetic complications: no    Last Vitals:  Vitals:   12/21/19 0741 12/21/19 0940  BP: (!) 159/105   Pulse:    Resp:    Temp:  36.4 C  SpO2:      Last Pain:  Vitals:   12/21/19 0940  TempSrc: Oral  PainSc: 0-No pain                 Alieyah Spader,E. Akili Cuda

## 2019-12-21 NOTE — Transfer of Care (Signed)
Immediate Anesthesia Transfer of Care Note  Patient: KATERYNA GRANTHAM  Procedure(s) Performed: ESOPHAGOGASTRODUODENOSCOPY (EGD) WITH PROPOFOL with possible banding (N/A ) POSSIBLE ESOPHAGEAL BANDING (N/A )  Patient Location: PACU  Anesthesia Type:MAC  Level of Consciousness: awake, alert  and oriented  Airway & Oxygen Therapy: Patient Spontanous Breathing and Patient connected to face mask oxygen  Post-op Assessment: Report given to RN, Post -op Vital signs reviewed and stable and Patient moving all extremities X 4  Post vital signs: Reviewed and stable  Last Vitals:  Vitals Value Taken Time  BP    Temp    Pulse 80 12/21/19 0937  Resp 23 12/21/19 0937  SpO2 100 % 12/21/19 0937  Vitals shown include unvalidated device data.  Last Pain:  Vitals:   12/21/19 0738  TempSrc: Oral  PainSc: 0-No pain         Complications: No apparent anesthesia complications

## 2019-12-21 NOTE — H&P (Signed)
Date of Initial H&P: 12/17/19  History reviewed, patient examined, no change in status, stable for surgery.

## 2019-12-21 NOTE — Anesthesia Procedure Notes (Signed)
Procedure Name: MAC Date/Time: 12/21/2019 9:11 AM Performed by: Niel Hummer, CRNA Pre-anesthesia Checklist: Patient identified, Emergency Drugs available, Suction available and Patient being monitored Patient Re-evaluated:Patient Re-evaluated prior to induction Oxygen Delivery Method: Simple face mask

## 2019-12-22 ENCOUNTER — Encounter: Payer: Self-pay | Admitting: *Deleted

## 2019-12-22 ENCOUNTER — Encounter: Payer: Self-pay | Admitting: Nurse Practitioner

## 2019-12-24 ENCOUNTER — Other Ambulatory Visit: Payer: Self-pay | Admitting: Nurse Practitioner

## 2019-12-24 ENCOUNTER — Encounter: Payer: Self-pay | Admitting: Nurse Practitioner

## 2019-12-24 DIAGNOSIS — K219 Gastro-esophageal reflux disease without esophagitis: Secondary | ICD-10-CM

## 2019-12-24 MED ORDER — PROPRANOLOL HCL 20 MG PO TABS: 20.0000 mg | ORAL_TABLET | Freq: Two times a day (BID) | ORAL | 3 refills | Status: DC

## 2020-01-24 ENCOUNTER — Other Ambulatory Visit: Payer: Self-pay

## 2020-01-24 ENCOUNTER — Encounter: Payer: Self-pay | Admitting: Registered"

## 2020-01-24 ENCOUNTER — Encounter: Payer: BC Managed Care – PPO | Attending: Nurse Practitioner | Admitting: Registered"

## 2020-01-24 DIAGNOSIS — E1169 Type 2 diabetes mellitus with other specified complication: Secondary | ICD-10-CM | POA: Insufficient documentation

## 2020-01-24 DIAGNOSIS — E669 Obesity, unspecified: Secondary | ICD-10-CM

## 2020-01-24 NOTE — Progress Notes (Signed)
Diabetes Self-Management Education  Visit Type:    Appt. Start Time: 1605 Appt. End Time: 1640  01/24/2020  Angela Castillo, identified by name and date of birth, is a 44 y.o. female with a diagnosis of Diabetes:  .   ASSESSMENT  Weight 223 lb 8 oz (101.4 kg). Body mass index is 38.36 kg/m.   Pt reports having irregular periods and started birth control to regulate periods. Pt unsure of the name of the new medication.   Pt reports things have still been busy at work with no changes in sight. Reports pt has only checked blood sugar once this week and it was 117. Reports prior to this week nothing was out of range per pt. Reports she cannot remember the numbers. Denies having any lows, but does report feeling a little off once but blood sugar was normal when she checked (117).   Reports she can tell she has gained weight because her back has been having spasms. Also reports pants getting tighter. Reports eating things she had stopped eating before-sweet teas (not daily but more than before), and other foods she had stopped eating. Reports she wants to either stop tea altogether or do unsweetened tea. Reports after today she wants to stop getting [employer] double shot cans while at work because they will cause her to go to have a bowel movement.   Reports last time she ate was early this morning. At midnight she had a crispy chicken sandwich and large sweet tea (didn't drink all of it), 4 AM: 2% milk peanut butter crackers.  Reports she needs to start drinking more water. Reports having stabbing pains like pain needles in upper abdomen. Pt feels it may be due to inadequate water intake. Reports it gets some better after drinking water. Reports feeling nauseous sometimes after drinking water.   Pt works Sundays-Thursday and typically has Fridays and Saturdays off.   Pt reports she needs to get back on her routine with packing her lunches and snacks for work. Reports needing motivation  to put aside that time to prep.   24-Hour Recall: Work Day 325 AM: [employer] cereal with 1% milk, [employer] frozen sugar free mocha (reports no longer with whipped cream) 930 AM: broccoli and cheese Progresso soup, oyster crackers  Snk (7 AM-end of shift): [employer]  (all day long) 330 PM: 7 oz hamburger steak with bourbon glaze, rice pilaf, broccoli  PM: None reported.  Beverages: 16 oz water most days   Physical Activity: Walking at work-none regularly otherwise.   Individualized Plan for Diabetes Self-Management Training:  Learning Objective:  Patient will have a greater understanding of diabetes self-management. Patient education plan is to attend individual and/or group sessions per assessed needs and concerns.   Intervention: Discussed getting back to routine of prepping and packing foods for work. Discussed that pt leftovers will keep for 3 days so pt can prep multiple days at once rather than packing before each work day. Each additional day packed if progress, even if pt starts with packing part of the week that is still progress and beneficial. Discussed increasing water intake. Pt wanted to set a goal of saving half of meals purchased out for later. Discussed gradually adding in more water each day-can be in or outdoors. Discussed taking a lap around stores before shopping. Advised pt to consult doctor about pain in abdomen. Pt appeared agreeable to information/goals discussed.   Instructions/Goals:  -Continue working to eat every 3-5 hours while awake.   -  Try to avoid going more than 4-5 hours without having some carbohydrates paired with protein to help prevent lows.   -Recommend 2-3 carbohydrates choices per meal (30-45 g).   -Prep for 3 days at a time.   Want to ensure each meal includes lean protein, carbohydrate (30-45 g), and non-starchy vegetables.   -Try to include more water: ultimate goal at least 64 oz per day.   Starting goal: 3 bottles per  day.   -When getting take out-put part of the meal away for another meal.   Make physical activity a part of your week. Try to include at least 30 minutes of physical activity 5 days each week or at least 150 minutes per week. Regular physical activity promotes overall health-including helping to reduce risk for heart disease and diabetes, promoting mental health, and helping [place] sleep better.  -Goal: Add 10 minutes of walking to your days and gradually work to 30 minutes most days. This can include indoors or outdoors. May take a lap around store before you start shopping.  Let your doctor know about stabbing pains in upper. abdomen.   Patient Instructions  Instructions/Goals:  -Continue working to eat every 3-5 hours while awake.   -Try to avoid going more than 4-5 hours without having some carbohydrates paired with protein to help prevent lows.   -Recommend 2-3 carbohydrates choices per meal (30-45 g).   -Prep for 3 days at a time.   Want to ensure each meal includes lean protein, carbohydrate (30-45 g), and non-starchy vegetables.   -Try to include more water: ultimate goal at least 64 oz per day.   Starting goal: 3 bottles per day.   -When getting take out-put part of the meal away for another meal.   Make physical activity a part of your week. Try to include at least 30 minutes of physical activity 5 days each week or at least 150 minutes per week. Regular physical activity promotes overall health-including helping to reduce risk for heart disease and diabetes, promoting mental health, and helping [place] sleep better.  -Goal: Add 10 minutes of walking to your days and gradually work to 30 minutes most days. This can include indoors or outdoors. May take a lap around store before you start shopping.  Let your doctor know about stabbing pains in upper. abdomen.   If problems or questions, patient to contact team via:  Phone and Email  Future DSME appointment:  2 months.

## 2020-01-24 NOTE — Patient Instructions (Addendum)
Instructions/Goals:  -Continue working to eat every 3-5 hours while awake.   -Try to avoid going more than 4-5 hours without having some carbohydrates paired with protein to help prevent lows.   -Recommend 2-3 carbohydrates choices per meal (30-45 g).   -Prep for 3 days at a time.   Want to ensure each meal includes lean protein, carbohydrate (30-45 g), and non-starchy vegetables.   -Try to include more water: ultimate goal at least 64 oz per day.   Starting goal: 3 bottles per day.   -When getting take out-put part of the meal away for another meal.   Make physical activity a part of your week. Try to include at least 30 minutes of physical activity 5 days each week or at least 150 minutes per week. Regular physical activity promotes overall health-including helping to reduce risk for heart disease and diabetes, promoting mental health, and helping [place] sleep better.  -Goal: Add 10 minutes of walking to your days and gradually work to 30 minutes most days. This can include indoors or outdoors. May take a lap around store before you start shopping.  Let your doctor know about stabbing pains in upper. abdomen.

## 2020-01-29 ENCOUNTER — Other Ambulatory Visit: Payer: Self-pay | Admitting: Family Medicine

## 2020-02-03 ENCOUNTER — Other Ambulatory Visit: Payer: Self-pay | Admitting: Nurse Practitioner

## 2020-02-03 ENCOUNTER — Other Ambulatory Visit: Payer: Self-pay | Admitting: Family Medicine

## 2020-02-03 DIAGNOSIS — E1165 Type 2 diabetes mellitus with hyperglycemia: Secondary | ICD-10-CM

## 2020-02-03 DIAGNOSIS — IMO0002 Reserved for concepts with insufficient information to code with codable children: Secondary | ICD-10-CM

## 2020-02-04 ENCOUNTER — Ambulatory Visit: Payer: BC Managed Care – PPO | Attending: Internal Medicine

## 2020-02-04 DIAGNOSIS — Z23 Encounter for immunization: Secondary | ICD-10-CM

## 2020-02-04 NOTE — Progress Notes (Signed)
   Covid-19 Vaccination Clinic  Name:  Angela Castillo    MRN: [id number] DOB: [birth year]  02/04/2020  [name] was observed post Covid-19 immunization for 15 minutes without incident. She was provided with Vaccine Information Sheet and instruction to access the V-Safe system.   [name] was instructed to call 911 with any severe reactions post vaccine: Marland Kitchen Difficulty breathing  . Swelling of face and throat  . A fast heartbeat  . A bad rash all over body  . Dizziness and weakness   Immunizations Administered    Name Date Dose VIS Date Route   Pfizer COVID-19 Vaccine 02/04/2020 10:46 AM 0.3 mL 10/15/2019 Intramuscular   Manufacturer: [employer]   Lot: [id number]   [place]: [id number]

## 2020-02-06 ENCOUNTER — Other Ambulatory Visit: Payer: Self-pay | Admitting: Nurse Practitioner

## 2020-02-06 DIAGNOSIS — I10 Essential (primary) hypertension: Secondary | ICD-10-CM

## 2020-02-07 ENCOUNTER — Other Ambulatory Visit: Payer: Self-pay | Admitting: Family Medicine

## 2020-02-07 DIAGNOSIS — I10 Essential (primary) hypertension: Secondary | ICD-10-CM

## 2020-02-28 ENCOUNTER — Ambulatory Visit: Payer: BC Managed Care – PPO | Attending: Internal Medicine

## 2020-02-28 DIAGNOSIS — Z23 Encounter for immunization: Secondary | ICD-10-CM

## 2020-02-28 NOTE — Progress Notes (Signed)
   Covid-19 Vaccination Clinic  Name:  Angela Castillo    MRN: [id number] DOB: June 30, 1976  02/28/2020  [name] was observed post Covid-19 immunization for 15 minutes without incident. She was provided with Vaccine Information Sheet and instruction to access the V-Safe system.   [name] was instructed to call 911 with any severe reactions post vaccine: Marland Kitchen Difficulty breathing  . Swelling of face and throat  . A fast heartbeat  . A bad rash all over body  . Dizziness and weakness   Immunizations Administered    Name Date Dose VIS Date Route   Pfizer COVID-19 Vaccine 02/28/2020  3:17 PM 0.3 mL 12/29/2018 Intramuscular   Manufacturer: [place]   Lot: [id number]   [place]: [id number]

## 2020-03-01 ENCOUNTER — Other Ambulatory Visit: Payer: Self-pay | Admitting: Family Medicine

## 2020-03-01 ENCOUNTER — Ambulatory Visit: Payer: BC Managed Care – PPO | Attending: Nurse Practitioner | Admitting: Nurse Practitioner

## 2020-03-01 ENCOUNTER — Other Ambulatory Visit: Payer: Self-pay

## 2020-03-01 DIAGNOSIS — E1165 Type 2 diabetes mellitus with hyperglycemia: Secondary | ICD-10-CM

## 2020-03-01 DIAGNOSIS — IMO0002 Reserved for concepts with insufficient information to code with codable children: Secondary | ICD-10-CM

## 2020-03-07 ENCOUNTER — Other Ambulatory Visit: Payer: Self-pay

## 2020-03-07 ENCOUNTER — Encounter: Payer: Self-pay | Admitting: Nurse Practitioner

## 2020-03-07 ENCOUNTER — Ambulatory Visit: Payer: BC Managed Care – PPO | Attending: Nurse Practitioner | Admitting: Nurse Practitioner

## 2020-03-07 ENCOUNTER — Other Ambulatory Visit: Payer: Self-pay | Admitting: Family Medicine

## 2020-03-07 VITALS — BP 154/96 | HR 69 | Temp 97.7°F | Ht 64.5 in | Wt 224.0 lb

## 2020-03-07 DIAGNOSIS — I10 Essential (primary) hypertension: Secondary | ICD-10-CM

## 2020-03-07 DIAGNOSIS — E118 Type 2 diabetes mellitus with unspecified complications: Secondary | ICD-10-CM | POA: Diagnosis not present

## 2020-03-07 DIAGNOSIS — H6121 Impacted cerumen, right ear: Secondary | ICD-10-CM

## 2020-03-07 DIAGNOSIS — IMO0002 Reserved for concepts with insufficient information to code with codable children: Secondary | ICD-10-CM

## 2020-03-07 DIAGNOSIS — R519 Headache, unspecified: Secondary | ICD-10-CM

## 2020-03-07 DIAGNOSIS — E1165 Type 2 diabetes mellitus with hyperglycemia: Secondary | ICD-10-CM | POA: Diagnosis not present

## 2020-03-07 LAB — POCT GLYCOSYLATED HEMOGLOBIN (HGB A1C): Hemoglobin A1C: 5.2 % (ref 4.0–5.6)

## 2020-03-07 LAB — GLUCOSE, POCT (MANUAL RESULT ENTRY): POC Glucose: 128 mg/dl — AB (ref 70–99)

## 2020-03-07 MED ORDER — METFORMIN HCL 500 MG PO TABS: 500.0000 mg | ORAL_TABLET | Freq: Two times a day (BID) | ORAL | 1 refills | Status: DC

## 2020-03-07 MED ORDER — AMITRIPTYLINE HCL 25 MG PO TABS: 25.0000 mg | ORAL_TABLET | Freq: Every day | ORAL | 0 refills | Status: DC

## 2020-03-07 MED ORDER — LOSARTAN POTASSIUM-HCTZ 100-25 MG PO TABS: 1.0000 | ORAL_TABLET | Freq: Every day | ORAL | 3 refills | Status: DC

## 2020-03-07 NOTE — Progress Notes (Signed)
Assessment & Plan:  Angela Castillo was seen today for medication refill.  Diagnoses and all orders for this visit:  Essential hypertension -     losartan-hydrochlorothiazide (HYZAAR) 100-25 MG tablet; Take 1 tablet by mouth daily. -     CMP14+EGFR Continue all antihypertensives as prescribed.  Remember to bring in your blood pressure log with you for your follow up appointment.  DASH/Mediterranean Diets are healthier choices for HTN.    Diabetes mellitus type 2, uncontrolled, with complications (HCC) -     Glucose (CBG) -     HgB A1c -     metFORMIN (GLUCOPHAGE) 500 MG tablet; Take 1 tablet (500 mg total) by mouth 2 (two) times daily with a meal. Continue blood sugar control as discussed in office today, low carbohydrate diet, and regular physical exercise as tolerated, 150 minutes per week (30 min each day, 5 days per week, or 50 min 3 days per week). Keep blood sugar logs with fasting goal of 90-130 mg/dl, post prandial (after you eat) less than 180.  For Hypoglycemia: BS <60 and Hyperglycemia BS >400; contact the clinic ASAP. Annual eye exams and foot exams are recommended.   Impacted cerumen of right ear -     fluticasone (FLONASE) 50 MCG/ACT nasal spray; Place 1 spray into both nostrils daily as needed for allergies (ear pressure).  Frequent headaches -     amitriptyline (ELAVIL) 25 MG tablet; Take 1 tablet (25 mg total) by mouth at bedtime. -     fluticasone (FLONASE) 50 MCG/ACT nasal spray; Place 1 spray into both nostrils daily as needed for allergies (ear pressure).    Patient has been counseled on age-appropriate routine health concerns for screening and prevention. These are reviewed and up-to-date. Referrals have been placed accordingly. Immunizations are up-to-date or declined.    Subjective:   Chief Complaint  Patient presents with  . Medication Refill    Pt is here for medication refill. Pt. is having presure on her right ear.    HPI Angela Castillo 44  y.o. female presents to office today for follow up.     Still having menstrual cramps despite taking OCP. Flow is lighter however.   Headaches Increased headaches. Had recent vision exam which did not show any retinopathy. Headaches occuring Frontal, occipital and temporal. Taking mutliple doses of continued release tylenol. Orson Eva are occurring every day. She has never taken imitrex or any prescription migraine medications. Had an episode of nausea with a headache before.    DM TYPE 2 Well controlled. Will decrease metformin from 1000 mg BID to 500 mg BID. She is taking ARB and STATIN.  Lab Results  Component Value Date   HGBA1C 5.2 03/07/2020   Essential Hypertension Not well controlled. She has not been able to check her blood pressure at home. Taking Losartan 100 mg. Will add HCTZ 25 mg daily as combination. Denies chest pain, shortness of breath, palpitations, lightheadedness, dizziness, headaches or BLE edema.  BP Readings from Last 3 Encounters:  03/07/20 (!) 154/96  12/21/19 (!) 170/85  09/20/19 (!) 167/87     Review of Systems  Constitutional: Negative for fever, malaise/fatigue and weight loss.  HENT: Negative for nosebleeds.        Cerumen impaction  Eyes: Negative.  Negative for blurred vision, double vision and photophobia.  Respiratory: Negative.  Negative for cough and shortness of breath.   Cardiovascular: Negative.  Negative for chest pain, palpitations and leg swelling.  Gastrointestinal: Negative.  Negative for  heartburn, nausea and vomiting.  Musculoskeletal: Negative.  Negative for myalgias.  Neurological: Positive for headaches. Negative for dizziness, focal weakness and seizures.  Psychiatric/Behavioral: Negative.  Negative for suicidal ideas.    Past Medical History:  Diagnosis Date  . Anemia Dx January 03 2015  . Diabetes mellitus without complication ([place])   . Elevated liver enzymes   . Essential hypertension Dx Apr 2016  . Fatty liver   . H/O: upper  GI bleed 03/2019    variceal bleed   . History of blood transfusion 03/2019  . Liver cirrhosis secondary to NASH ([place])   . Pneumonia 03/2019  . Reflux   . Thrombocytopenia ([place])     Past Surgical History:  Procedure Laterality Date  . ESOPHAGEAL BANDING  03/22/2019   Procedure: ESOPHAGEAL BANDING;  Surgeon: Wilford Corner, MD;  Location: [facility];  Service: Endoscopy;;  . ESOPHAGEAL BANDING  03/23/2019   Procedure: ESOPHAGEAL BANDING;  Surgeon: Wilford Corner, MD;  Location: [facility];  Service: Endoscopy;;  . ESOPHAGEAL BANDING  09/20/2019   Procedure: ESOPHAGEAL BANDING;  Surgeon: Wilford Corner, MD;  Location: [facility];  Service: Endoscopy;;  . ESOPHAGEAL BANDING N/A 12/21/2019   Procedure: ESOPHAGEAL BANDING;  Surgeon: Wilford Corner, MD;  Location: [facility];  Service: Endoscopy;  Laterality: N/A;  . ESOPHAGOGASTRODUODENOSCOPY N/A 03/22/2019   Procedure: ESOPHAGOGASTRODUODENOSCOPY (EGD);  Surgeon: Wilford Corner, MD;  Location: [facility];  Service: Endoscopy;  Laterality: N/A;  . ESOPHAGOGASTRODUODENOSCOPY N/A 03/23/2019   Procedure: ESOPHAGOGASTRODUODENOSCOPY (EGD);  Surgeon: Wilford Corner, MD;  Location: [facility];  Service: Endoscopy;  Laterality: N/A;  . ESOPHAGOGASTRODUODENOSCOPY (EGD) WITH PROPOFOL N/A 07/23/2019   Procedure: ESOPHAGOGASTRODUODENOSCOPY (EGD) WITH PROPOFOL with possible banding;  Surgeon: Wilford Corner, MD;  Location: [facility];  Service: Endoscopy;  Laterality: N/A;  . ESOPHAGOGASTRODUODENOSCOPY (EGD) WITH PROPOFOL N/A 09/20/2019   Procedure: ESOPHAGOGASTRODUODENOSCOPY (EGD) WITH PROPOFOL;  Surgeon: Wilford Corner, MD;  Location: [facility];  Service: Endoscopy;  Laterality: N/A;  . ESOPHAGOGASTRODUODENOSCOPY (EGD) WITH PROPOFOL N/A 12/21/2019   Procedure: ESOPHAGOGASTRODUODENOSCOPY (EGD) WITH PROPOFOL with possible banding;  Surgeon: Wilford Corner, MD;  Location: [facility];  Service: Endoscopy;  Laterality:  N/A;  . TUBAL LIGATION  Jul 25, 2000    Family History  Problem Relation Age of Onset  . COPD Mother   . Esophageal cancer Mother   . Diabetes Mother   . Cancer Mother   . Hypertension Mother   . Diabetes Father   . Breast cancer Neg Hx     Social History Reviewed with no changes to be made today.   Outpatient Medications Prior to Visit  Medication Sig Dispense Refill  . blood glucose meter kit and supplies KIT Dispense based on patient and insurance preference. Use up to four times daily as directed. (FOR ICD-9 250.00, 250.01). Dispense Relion 1 each 0  . ferrous sulfate 325 (65 FE) MG tablet Take 1 tablet (325 mg total) by mouth 2 (two) times daily with a meal. 60 tablet 0  . glucose blood (CONTOUR NEXT TEST) test strip USE UP TO FOUR TIMES DAILY AS DIRECTED 100 strip 6  . ketoconazole (NIZORAL) 2 % cream Apply 1 application topically daily. (Patient taking differently: Apply 1 application topically daily as needed for irritation. ) 30 g 3  . Microlet Lancets MISC USE UP TO FOUR TIMES DAILY AS DIRECTED 100 each 11  . Norgestimate-Ethinyl Estradiol Triphasic (ORTHO TRI-CYCLEN, 28,) 0.18/0.215/0.25 MG-35 MCG tablet Take 1 tablet by mouth daily. 1 Package 11  . pantoprazole (PROTONIX) 40  MG tablet TAKE 1 TABLET(40 MG) BY MOUTH DAILY (Patient taking differently: Take 40 mg by mouth daily. ) 90 tablet 1  . propranolol (INDERAL) 20 MG tablet Take 1 tablet (20 mg total) by mouth 2 (two) times daily. 60 tablet 3  . losartan (COZAAR) 100 MG tablet Take 1 tablet (100 mg total) by mouth daily. Must have office visit for refills 30 tablet 0  . metFORMIN (GLUCOPHAGE) 1000 MG tablet Take 1 tablet (1,000 mg total) by mouth 2 (two) times daily with a meal. Must have office visit for refills 60 tablet 0  . acetaminophen (TYLENOL) 650 MG CR tablet Take 1,300 mg by mouth every 8 (eight) hours as needed for pain.     Marland Kitchen amLODipine (NORVASC) 5 MG tablet Take 1 tablet (5 mg total) by mouth daily. (Patient  not taking: Reported on 10/07/2019) 90 tablet 3  . fluticasone (FLONASE) 50 MCG/ACT nasal spray Place 1 spray into both nostrils daily as needed for allergies.      No facility-administered medications prior to visit.    Allergies  Allergen Reactions  . Nsaids     GI BLEED       Objective:    BP (!) 154/96 (BP Location: Left Arm, Patient Position: Sitting, Cuff Size: Normal)   Pulse 69   Temp 97.7 F (36.5 C) (Temporal)   Ht 5' 4.5" (1.638 m)   Wt 224 lb (101.6 kg)   LMP 03/05/2020   SpO2 98%   BMI 37.86 kg/m  Wt Readings from Last 3 Encounters:  03/07/20 224 lb (101.6 kg)  01/24/20 223 lb 8 oz (101.4 kg)  12/21/19 217 lb (98.4 kg)    Physical Exam Vitals and nursing note reviewed.  Constitutional:      Appearance: She is well-developed.  HENT:     Head: Normocephalic and atraumatic.     Right Ear: There is impacted cerumen.     Left Ear: Hearing, tympanic membrane, ear canal and external ear normal.     Ears:     Comments: Right ear irrigated. TM visualized with no signs of infection.  Cardiovascular:     Rate and Rhythm: Normal rate and regular rhythm.     Heart sounds: Normal heart sounds. No murmur. No friction rub. No gallop.   Pulmonary:     Effort: Pulmonary effort is normal. No tachypnea or respiratory distress.     Breath sounds: Normal breath sounds. No decreased breath sounds, wheezing, rhonchi or rales.  Chest:     Chest wall: No tenderness.  Abdominal:     General: Bowel sounds are normal.     Palpations: Abdomen is soft.  Musculoskeletal:        General: Normal range of motion.     Cervical back: Normal range of motion.  Skin:    General: Skin is warm and dry.  Neurological:     Mental Status: She is alert and oriented to person, place, and time.     Coordination: Coordination normal.  Psychiatric:        Behavior: Behavior normal. Behavior is cooperative.        Thought Content: Thought content normal.        Judgment: Judgment normal.           Patient has been counseled extensively about nutrition and exercise as well as the importance of adherence with medications and regular follow-up. The patient was given clear instructions to go to ER or return to medical center if symptoms don't  improve, worsen or new problems develop. The patient verbalized understanding.   Follow-up: Return in about 2 weeks (around 03/21/2020) for BP recheck with LUKE then see me in 3 months may contact me via mychart regarding headaches.   Gildardo Pounds, FNP-BC [facility], [place]   03/08/2020, 10:35 PM

## 2020-03-08 ENCOUNTER — Encounter: Payer: Self-pay | Admitting: Nurse Practitioner

## 2020-03-08 LAB — CMP14+EGFR
ALT: 20 IU/L (ref 0–32)
AST: 21 IU/L (ref 0–40)
Albumin/Globulin Ratio: 1.2 (ref 1.2–2.2)
Albumin: 4.1 g/dL (ref 3.8–4.8)
Alkaline Phosphatase: 81 IU/L (ref 39–117)
BUN/Creatinine Ratio: 22 (ref 9–23)
BUN: 13 mg/dL (ref 6–24)
Bilirubin Total: 0.3 mg/dL (ref 0.0–1.2)
CO2: 27 mmol/L (ref 20–29)
Calcium: 9.3 mg/dL (ref 8.7–10.2)
Chloride: 103 mmol/L (ref 96–106)
Creatinine, Ser: 0.59 mg/dL (ref 0.57–1.00)
GFR calc Af Amer: 130 mL/min/{1.73_m2} (ref >59)
GFR calc non Af Amer: 113 mL/min/{1.73_m2} (ref >59)
Globulin, Total: 3.5 g/dL (ref 1.5–4.5)
Glucose: 98 mg/dL (ref 65–99)
Potassium: 3.8 mmol/L (ref 3.5–5.2)
Sodium: 142 mmol/L (ref 134–144)
Total Protein: 7.6 g/dL (ref 6.0–8.5)

## 2020-03-08 MED ORDER — FLUTICASONE PROPIONATE 50 MCG/ACT NA SUSP: 1.0000 | Freq: Every day | NASAL | 1 refills | Status: DC | PRN

## 2020-03-11 ENCOUNTER — Encounter: Payer: Self-pay | Admitting: Nurse Practitioner

## 2020-03-13 ENCOUNTER — Other Ambulatory Visit: Payer: Self-pay | Admitting: Nurse Practitioner

## 2020-03-13 DIAGNOSIS — H938X1 Other specified disorders of right ear: Secondary | ICD-10-CM

## 2020-03-13 NOTE — Progress Notes (Signed)
n

## 2020-03-21 ENCOUNTER — Ambulatory Visit (HOSPITAL_COMMUNITY)
Admission: RE | Admit: 2020-03-21 | Discharge: 2020-03-21 | Disposition: A | Discharge status: Home / Self Care | Payer: BC Managed Care – PPO | Source: Ambulatory Visit | Attending: Nurse Practitioner | Admitting: Nurse Practitioner

## 2020-03-21 ENCOUNTER — Other Ambulatory Visit: Payer: Self-pay

## 2020-03-21 ENCOUNTER — Telehealth: Payer: Self-pay | Admitting: Nurse Practitioner

## 2020-03-21 DIAGNOSIS — R1110 Vomiting, unspecified: Secondary | ICD-10-CM | POA: Diagnosis not present

## 2020-03-21 DIAGNOSIS — R112 Nausea with vomiting, unspecified: Secondary | ICD-10-CM | POA: Diagnosis not present

## 2020-03-21 DIAGNOSIS — R1900 Intra-abdominal and pelvic swelling, mass and lump, unspecified site: Secondary | ICD-10-CM | POA: Insufficient documentation

## 2020-03-21 IMAGING — CT CT ABD-PELV W/ CM
2 of 8 series · 12 of 46 positions shown, 18 images · IV contrast (APPLIED)
Comparison: [DATE]

CLINICAL DATA: Palpable left lower quadrant soft tissue mass.
Nausea and vomiting.

EXAM:
CT ABDOMEN AND PELVIS WITH CONTRAST
TECHNIQUE: Multidetector CT imaging of the abdomen and pelvis was performed
using the standard protocol following bolus administration of
intravenous contrast.
CONTRAST:  100mL OMNIPAQUE IOHEXOL 300 MG/ML  SOLN

[Series 2: axial st · axial · 0.97mm/px · z∈[+841,+1221]mm · 9 of 94 slices shown, 15 images]
[im 9/94  soft-tissue]
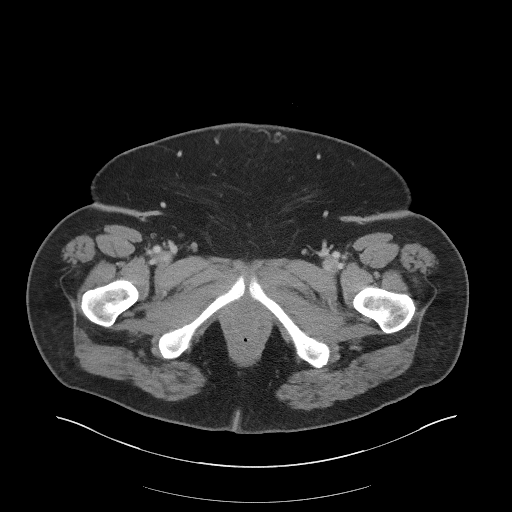
[im 9/94  bone]
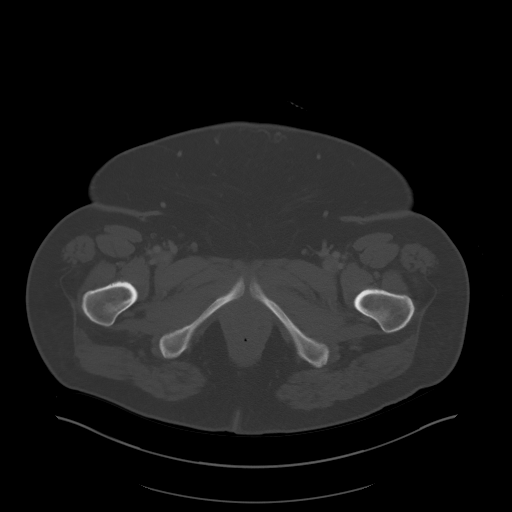
[im 17/94  soft-tissue]
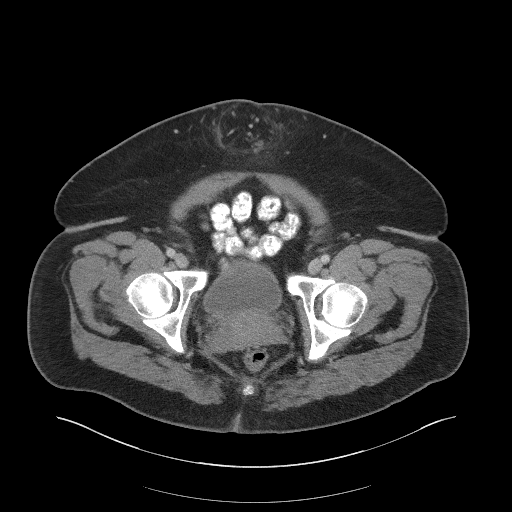
[im 26/94  soft-tissue]
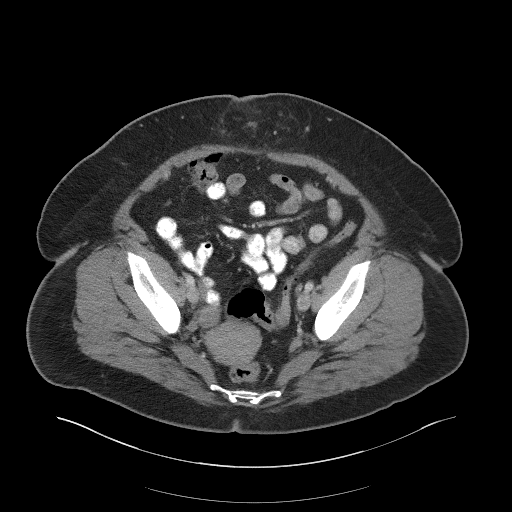
[im 34/94  soft-tissue]
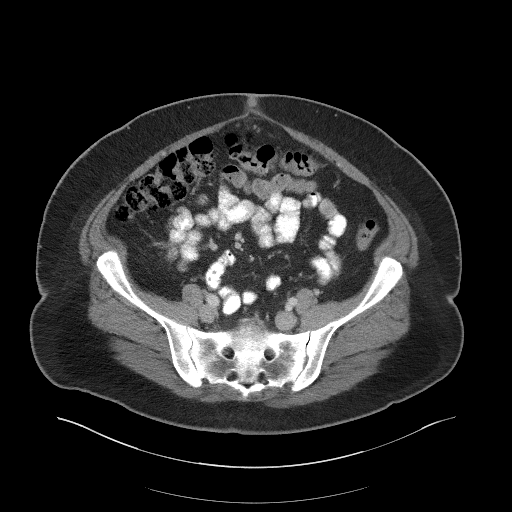
[im 51/94  soft-tissue]
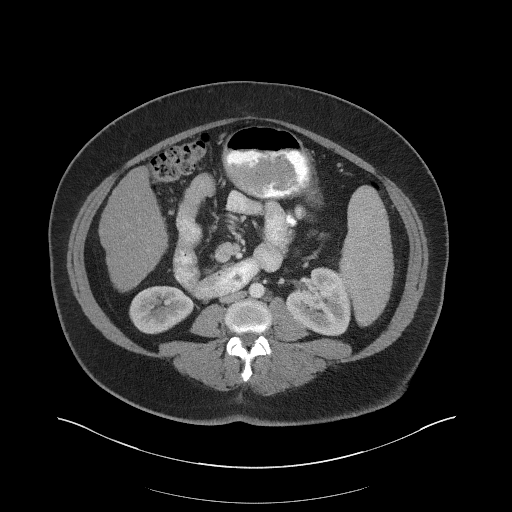
[im 60/94  soft-tissue]
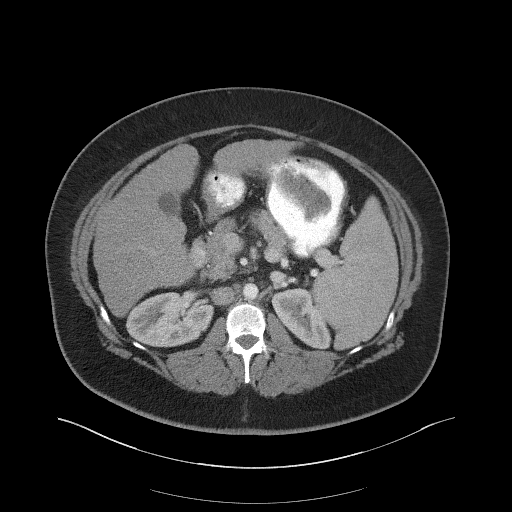
[im 60/94  lung]
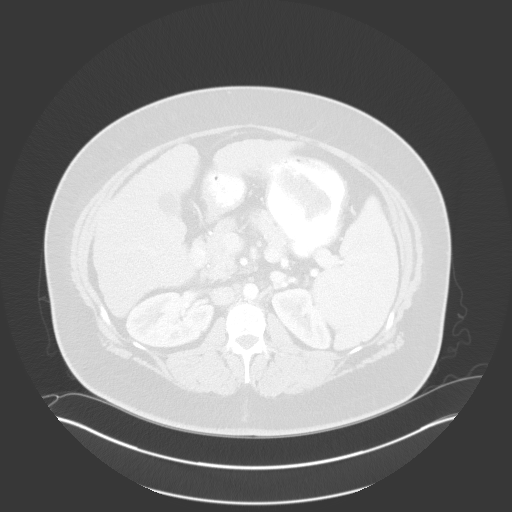
[im 68/94  soft-tissue]
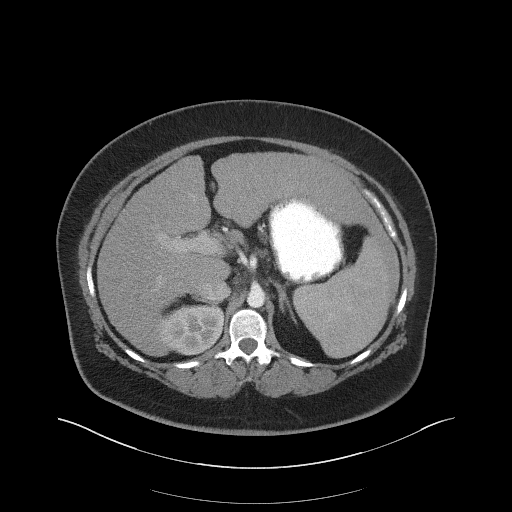
[im 68/94  lung]
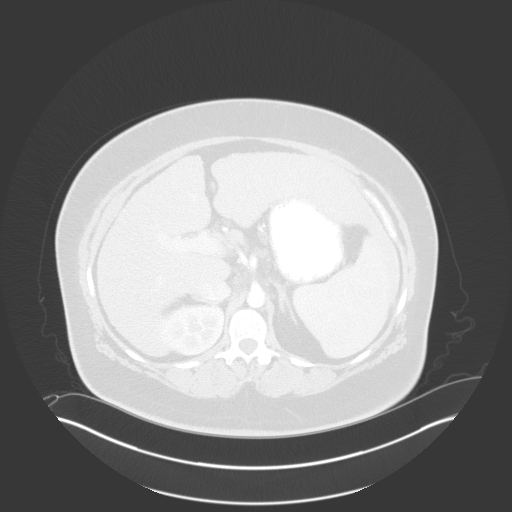
[im 77/94  soft-tissue]
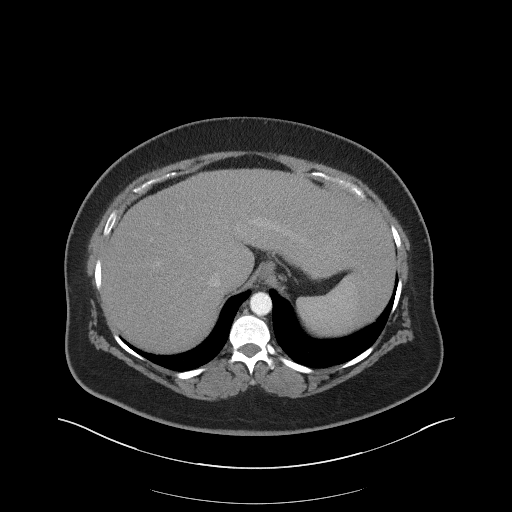
[im 77/94  lung]
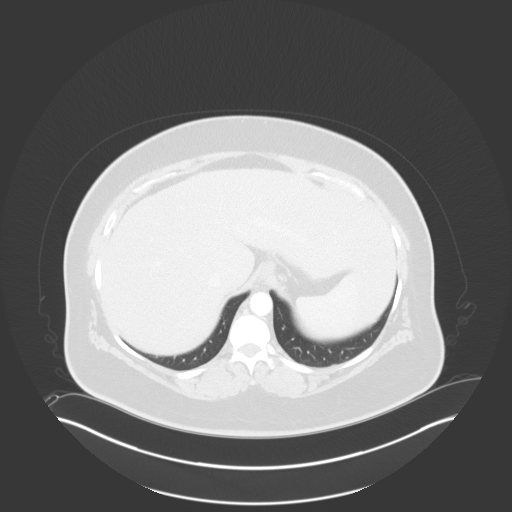
[im 85/94  soft-tissue]
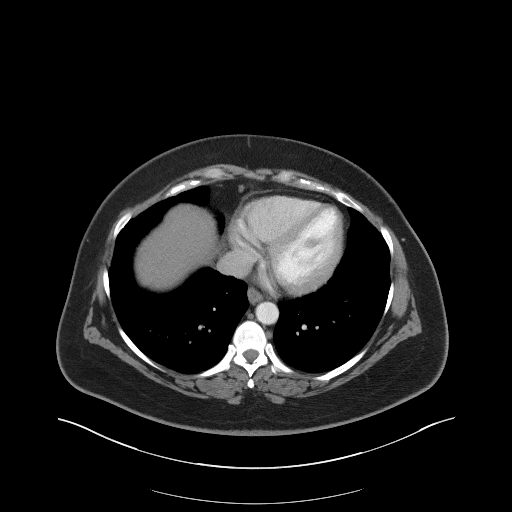
[im 85/94  lung]
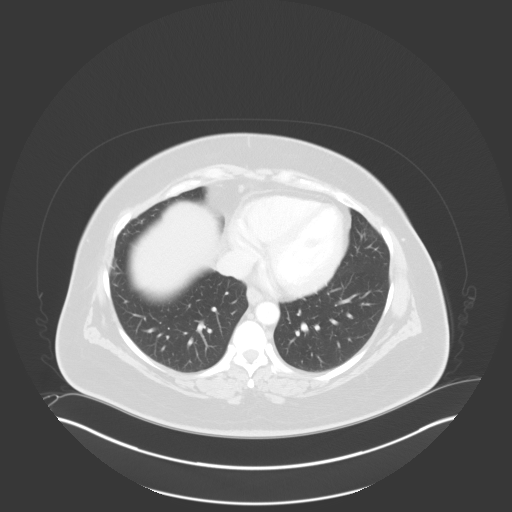
[im 85/94  bone]
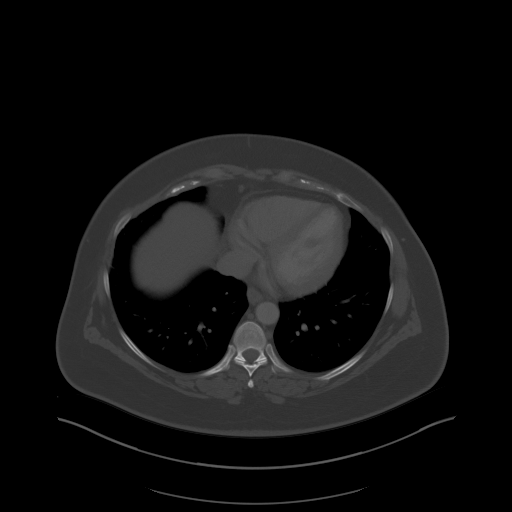

[Series 6: coronal st · coronal · 0.84mm/px · 3 of 123 slices shown]
[im 31/123  soft-tissue]
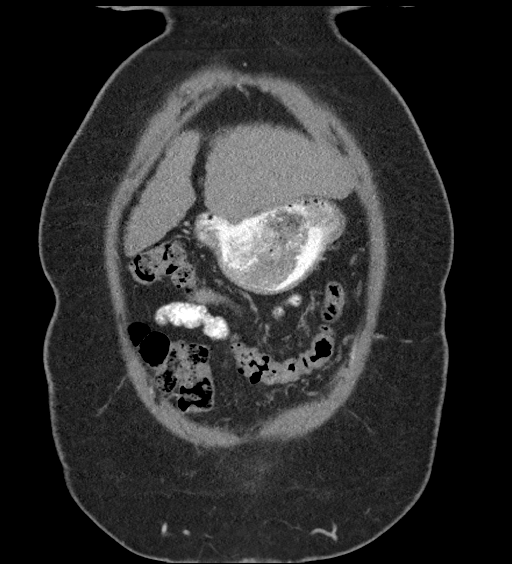
[im 62/123  soft-tissue]
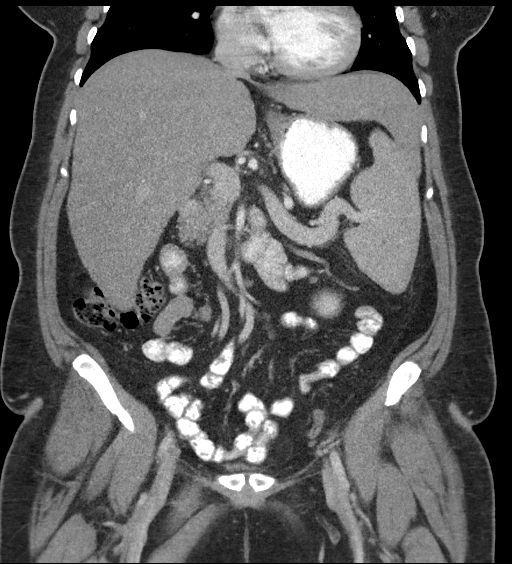
[im 92/123  soft-tissue]
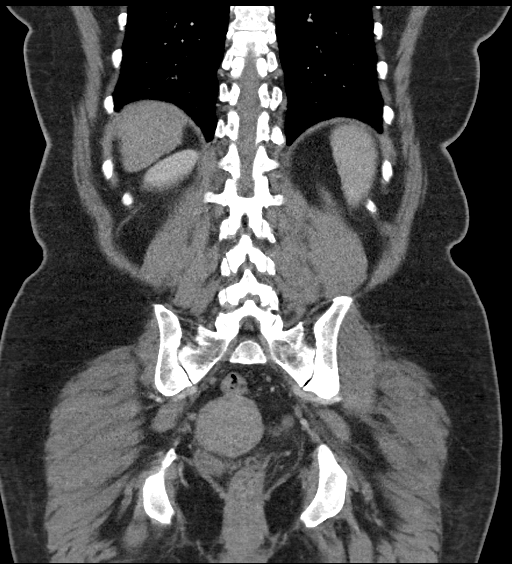

[12 of 46 positions shown; findings below may reference images not displayed]

FINDINGS: Lower Chest: No acute findings.

Hepatobiliary: No hepatic masses identified. Diffuse hepatic
steatosis is again noted, and there have been changes of progressive
cirrhosis since prior exam. Recanalization of paraumbilical veins is
consistent with portal venous hypertension. Gallbladder is
unremarkable. No evidence of biliary ductal dilatation.

Pancreas:  No mass or inflammatory changes.

Spleen: Mild-to-moderate splenomegaly, consistent with portal venous
hypertension. No splenic masses identified.

Adrenals/Urinary Tract: No masses identified. No evidence of
ureteral calculi or hydronephrosis.

Stomach/Bowel: No evidence of obstruction, inflammatory process or
abnormal fluid collections.

Vascular/Lymphatic: No pathologically enlarged lymph nodes. No
abdominal aortic aneurysm.

Reproductive:  No mass or other significant abnormality.

Other: A moderate paraumbilical ventral hernia is seen which
contains only omental fat. This is new since previous study. No
evidence of herniated bowel loops.

Musculoskeletal:  No suspicious bone lesions identified.
IMPRESSION: 1. Moderate paraumbilical ventral abdominal wall hernia, which
contains only omental fat.
2. Hepatic cirrhosis and findings of portal venous hypertension. No
evidence of hepatic neoplasm or ascites.

## 2020-03-21 MED ORDER — IOHEXOL 300 MG/ML  SOLN
100.0000 mL | Freq: Once | INTRAMUSCULAR | Status: AC | PRN
  Administered 2020-03-21: 100 mL via INTRAVENOUS

## 2020-03-21 MED ORDER — SODIUM CHLORIDE (PF) 0.9 % IJ SOLN
INTRAMUSCULAR | Status: AC
  Filled 2020-03-21: qty 50

## 2020-03-21 NOTE — Telephone Encounter (Signed)
Called in and requested in for a new Nutrition & Diabetics referral to be sent. The patient has an upcoming appointment but they need a new referral. Please follow up at your earliest convenience.

## 2020-03-21 NOTE — Telephone Encounter (Signed)
Will route to PCP 

## 2020-03-22 ENCOUNTER — Ambulatory Visit: Payer: BC Managed Care – PPO | Attending: Nurse Practitioner | Admitting: Pharmacist

## 2020-03-22 ENCOUNTER — Encounter: Payer: Self-pay | Admitting: Nurse Practitioner

## 2020-03-22 ENCOUNTER — Other Ambulatory Visit: Payer: Self-pay | Admitting: Nurse Practitioner

## 2020-03-22 VITALS — BP 146/98 | HR 72

## 2020-03-22 DIAGNOSIS — I10 Essential (primary) hypertension: Secondary | ICD-10-CM | POA: Diagnosis not present

## 2020-03-22 DIAGNOSIS — K760 Fatty (change of) liver, not elsewhere classified: Secondary | ICD-10-CM

## 2020-03-22 NOTE — Progress Notes (Signed)
   S:    PCP: Zelda   Patient arrives in good spirits. Presents to the clinic for hypertension evaluation, counseling, and management.  Patient was referred and last seen by Primary Care Provider on 03/07/2020. BP was above goal so Zelda started combination losartan-HCTZ 100-25 mg daily.   Patient reports adherence with medications. Took today.   Current BP Medications include:  Losartan-HCTZ 100-25 mg daily, propranolol 20 mg BID  Dietary habits include: tries to limit/cut back on salt; consumes coffee daily Exercise habits include: none outside of work Family / Social history:  -  DM (mother, father), HTN, (mother) - Tobacco: never smoker - Alcohol: rare   O:  Vitals:   03/22/20 1610  BP: (!) 146/98  Pulse: 72    Home BP readings: not able to check  Last 3 Office BP readings: BP Readings from Last 3 Encounters:  03/22/20 (!) 146/98  03/07/20 (!) 154/96  12/21/19 (!) 170/85   BMET    Component Value Date/Time   NA 142 03/07/2020 1651   K 3.8 03/07/2020 1651   CL 103 03/07/2020 1651   CO2 27 03/07/2020 1651   GLUCOSE 98 03/07/2020 1651   GLUCOSE 192 (H) 03/29/2019 0450   BUN 13 03/07/2020 1651   CREATININE 0.59 03/07/2020 1651   CREATININE 0.68 06/21/2016 1713   CALCIUM 9.3 03/07/2020 1651   GFRNONAA 113 03/07/2020 1651   GFRNONAA >89 06/21/2016 1713   GFRAA 130 03/07/2020 1651   GFRAA >89 06/21/2016 1713    Renal function: CrCl cannot be calculated (Unknown ideal weight.).  Clinical ASCVD: No  The 10-year ASCVD risk score Mikey Bussing DC Jr., et al., 2013) is: 3.8%   Values used to calculate the score:     Age: 44 years     Sex: Female     Is Non-Hispanic African American: No     Diabetic: Yes     Tobacco smoker: No     Systolic Blood Pressure: 622 mmHg     Is BP treated: Yes     HDL Cholesterol: 33 mg/dL     Total Cholesterol: 159 mg/dL  A/P: Hypertension longstanding. BP remains above goal on current medications but SBP improved from last visit. BP Goal  = <130/80 mmHg. Patient is adherent with current medications. Will hold off on changes today and have her return in 3 weeks for BP recheck.  -Continued current regimen.  -Counseled on lifestyle modifications for blood pressure control including reduced dietary sodium, increased exercise, adequate sleep  Results reviewed and written information provided. Total time in face-to-face counseling 15 minutes.   F/U Clinic Visit with me in 3 weeks.   Benard Halsted, PharmD, [place] [phone]

## 2020-03-25 ENCOUNTER — Other Ambulatory Visit: Payer: Self-pay | Admitting: Nurse Practitioner

## 2020-03-25 DIAGNOSIS — E1165 Type 2 diabetes mellitus with hyperglycemia: Secondary | ICD-10-CM

## 2020-03-25 DIAGNOSIS — IMO0002 Reserved for concepts with insufficient information to code with codable children: Secondary | ICD-10-CM

## 2020-03-29 ENCOUNTER — Ambulatory Visit: Payer: BC Managed Care – PPO | Admitting: Registered"

## 2020-04-11 ENCOUNTER — Other Ambulatory Visit: Payer: Self-pay | Admitting: Nurse Practitioner

## 2020-04-11 DIAGNOSIS — K219 Gastro-esophageal reflux disease without esophagitis: Secondary | ICD-10-CM

## 2020-04-11 MED ORDER — PROPRANOLOL HCL 20 MG PO TABS: 20.0000 mg | ORAL_TABLET | Freq: Two times a day (BID) | ORAL | 3 refills | Status: DC

## 2020-04-11 MED ORDER — PANTOPRAZOLE SODIUM 40 MG PO TBEC: DELAYED_RELEASE_TABLET | ORAL | 1 refills | Status: DC

## 2020-04-12 ENCOUNTER — Ambulatory Visit: Payer: 59 | Attending: Nurse Practitioner | Admitting: Pharmacist

## 2020-04-12 ENCOUNTER — Encounter: Payer: Self-pay | Admitting: Pharmacist

## 2020-04-12 ENCOUNTER — Other Ambulatory Visit: Payer: Self-pay

## 2020-04-12 VITALS — BP 145/82

## 2020-04-12 DIAGNOSIS — I10 Essential (primary) hypertension: Secondary | ICD-10-CM

## 2020-04-12 MED ORDER — AMLODIPINE BESYLATE 5 MG PO TABS
5.0000 mg | ORAL_TABLET | Freq: Every day | ORAL | 1 refills | Status: DC
Start: 2020-04-12 — End: 2020-05-02

## 2020-04-12 NOTE — Progress Notes (Signed)
   S:    PCP: Zelda   Patient arrives in good spirits. Presents to the clinic for hypertension evaluation, counseling, and management.  Patient was referred and last seen by Primary Care Provider on 03/07/2020. BP was above goal so Zelda started combination losartan-HCTZ 100-25 mg daily. I saw her on 03/22/2020 and made no changes.   Patient reports adherence with medications. Took today.   Current BP Medications include:  Losartan-HCTZ 100-25 mg daily, propranolol 20 mg BID  Dietary habits include: tries to limit/cut back on salt; consumes coffee daily Exercise habits include: none outside of work Family / Social history:  -  DM (mother, father), HTN, (mother) - Tobacco: never smoker - Alcohol: rare   O:  Vitals:   04/12/20 1559  BP: (!) 145/82    Home BP readings: not able to check  Last 3 Office BP readings: BP Readings from Last 3 Encounters:  04/12/20 (!) 145/82  03/22/20 (!) 146/98  03/07/20 (!) 154/96   BMET    Component Value Date/Time   NA 142 03/07/2020 1651   K 3.8 03/07/2020 1651   CL 103 03/07/2020 1651   CO2 27 03/07/2020 1651   GLUCOSE 98 03/07/2020 1651   GLUCOSE 192 (H) 03/29/2019 0450   BUN 13 03/07/2020 1651   CREATININE 0.59 03/07/2020 1651   CREATININE 0.68 06/21/2016 1713   CALCIUM 9.3 03/07/2020 1651   GFRNONAA 113 03/07/2020 1651   GFRNONAA >89 06/21/2016 1713   GFRAA 130 03/07/2020 1651   GFRAA >89 06/21/2016 1713    Renal function: CrCl cannot be calculated (Patient's most recent lab result is older than the maximum 21 days allowed.).  Clinical ASCVD: No  The 10-year ASCVD risk score Mikey Bussing DC Jr., et al., 2013) is: 3.7%   Values used to calculate the score:     Age: 44 years     Sex: Female     Is Non-Hispanic African American: No     Diabetic: Yes     Tobacco smoker: No     Systolic Blood Pressure: 161 mmHg     Is BP treated: Yes     HDL Cholesterol: 33 mg/dL     Total Cholesterol: 159 mg/dL  A/P: Hypertension longstanding.  BP remains above goal on current medications. BP Goal = <130/80 mmHg. Patient is adherent with current medications. She reports that she's not sure if her amlodipine was the cause of dizzy spells in the past. Will restart 5 mg amlodipine and I have instructed her to take this at bedtime.  -Start amlodipine 5 mg daily. -Continue Hyzaar 100-25 mg daily.  -Continued current regimen.  -Counseled on lifestyle modifications for blood pressure control including reduced dietary sodium, increased exercise, adequate sleep  Results reviewed and written information provided. Total time in face-to-face counseling 15 minutes.   F/U Clinic Visit with me in 3 weeks.   Benard Halsted, PharmD, [place] [phone]

## 2020-04-26 ENCOUNTER — Encounter: Payer: Self-pay | Admitting: Nurse Practitioner

## 2020-05-02 ENCOUNTER — Other Ambulatory Visit: Payer: Self-pay

## 2020-05-02 ENCOUNTER — Encounter: Payer: Self-pay | Admitting: Pharmacist

## 2020-05-02 ENCOUNTER — Ambulatory Visit: Payer: 59 | Attending: Nurse Practitioner | Admitting: Pharmacist

## 2020-05-02 VITALS — BP 120/82 | HR 64

## 2020-05-02 DIAGNOSIS — I10 Essential (primary) hypertension: Secondary | ICD-10-CM | POA: Diagnosis not present

## 2020-05-02 MED ORDER — AMLODIPINE BESYLATE 5 MG PO TABS
2.5000 mg | ORAL_TABLET | Freq: Every day | ORAL | 1 refills | Status: DC
Start: 2020-05-02 — End: 2020-05-25

## 2020-05-02 NOTE — Progress Notes (Signed)
   S:    PCP: Zelda   Patient arrives in good spirits. Presents to the clinic for hypertension evaluation, counseling, and management.  Patient was referred and last seen by Primary Care Provider on 03/07/2020. I've seen her a couple of times since and started amlodipine to aid with BP control.   Patient reports adherence with medications. Took today.   Current BP Medications include: amlodipine 5 mg (takes before bedtime - cut tablet in half) Losartan-HCTZ 100-25 mg daily, propranolol 20 mg BID  Dietary habits include: tries to limit/cut back on salt; consumes coffee daily Exercise habits include: none outside of work Family / Social history:  -  DM (mother, father), HTN, (mother) - Tobacco: never smoker - Alcohol: rare   O:  Vitals:   05/02/20 1629  BP: 120/82  Pulse: 64   Home BP readings:  - Reports SBP in the 110s  Last 3 Office BP readings: BP Readings from Last 3 Encounters:  05/02/20 120/82  04/12/20 (!) 145/82  03/22/20 (!) 146/98   BMET    Component Value Date/Time   NA 142 03/07/2020 1651   K 3.8 03/07/2020 1651   CL 103 03/07/2020 1651   CO2 27 03/07/2020 1651   GLUCOSE 98 03/07/2020 1651   GLUCOSE 192 (H) 03/29/2019 0450   BUN 13 03/07/2020 1651   CREATININE 0.59 03/07/2020 1651   CREATININE 0.68 06/21/2016 1713   CALCIUM 9.3 03/07/2020 1651   GFRNONAA 113 03/07/2020 1651   GFRNONAA >89 06/21/2016 1713   GFRAA 130 03/07/2020 1651   GFRAA >89 06/21/2016 1713    Renal function: CrCl cannot be calculated (Patient's most recent lab result is older than the maximum 21 days allowed.).  Clinical ASCVD: No  The 10-year ASCVD risk score Mikey Bussing DC Jr., et al., 2013) is: 2.5%   Values used to calculate the score:     Age: 44 years     Sex: Female     Is Non-Hispanic African American: No     Diabetic: Yes     Tobacco smoker: No     Systolic Blood Pressure: 193 mmHg     Is BP treated: Yes     HDL Cholesterol: 33 mg/dL     Total Cholesterol: 159  mg/dL  A/P: Hypertension longstanding. BP at goal on current medications. BP Goal = <130/80 mmHg. Patient is adherent with current medications. However, she had to break amlodipine tablet in half. Will change her med list to reflect this. -Continue amlodipine 2.5 mg daily. -Continue Hyzaar 100-25 mg daily.  -Continued current regimen.  -Counseled on lifestyle modifications for blood pressure control including reduced dietary sodium, increased exercise, adequate sleep  Results reviewed and written information provided. Total time in face-to-face counseling 15 minutes.   F/U Clinic Visit with [facility] Melia in August.  Benard Halsted, PharmD, [place] [phone]

## 2020-05-09 ENCOUNTER — Other Ambulatory Visit: Payer: Self-pay

## 2020-05-09 ENCOUNTER — Encounter (INDEPENDENT_AMBULATORY_CARE_PROVIDER_SITE_OTHER): Payer: Self-pay | Admitting: Otolaryngology

## 2020-05-09 ENCOUNTER — Ambulatory Visit (INDEPENDENT_AMBULATORY_CARE_PROVIDER_SITE_OTHER): Payer: 59 | Admitting: Otolaryngology

## 2020-05-09 VITALS — Temp 97.3°F

## 2020-05-09 DIAGNOSIS — M26629 Arthralgia of temporomandibular joint, unspecified side: Secondary | ICD-10-CM | POA: Diagnosis not present

## 2020-05-09 DIAGNOSIS — H9201 Otalgia, right ear: Secondary | ICD-10-CM

## 2020-05-09 NOTE — Progress Notes (Signed)
HPI: Angela Castillo is a 44 y.o. female who presents is referred by her PCP for evaluation of right ear pain as well as sensation of clogged right ear.  She has had no drainage from the ear symptoms are right-sided only and she feels like she has hearing loss in the right ear.  The pain is been intermittent but has been going on for over a year ever since she was discharged from the hospital last May because of a bad upper GI bleed..  Past Medical History:  Diagnosis Date  . Anemia Dx January 03 2015  . Diabetes mellitus without complication ([place])   . Elevated liver enzymes   . Essential hypertension Dx Apr 2016  . Fatty liver   . H/O: upper GI bleed 03/2019    variceal bleed   . History of blood transfusion 03/2019  . Liver cirrhosis secondary to NASH ([place])   . Pneumonia 03/2019  . Reflux   . Thrombocytopenia ([place])    Past Surgical History:  Procedure Laterality Date  . ESOPHAGEAL BANDING  03/22/2019   Procedure: ESOPHAGEAL BANDING;  Surgeon: Wilford Corner, MD;  [facility];  Service: Endoscopy;;  . ESOPHAGEAL BANDING  03/23/2019   Procedure: ESOPHAGEAL BANDING;  Surgeon: Wilford Corner, MD;  [facility];  Service: Endoscopy;;  . ESOPHAGEAL BANDING  09/20/2019   Procedure: ESOPHAGEAL BANDING;  Surgeon: Wilford Corner, MD;  [facility];  Service: Endoscopy;;  . ESOPHAGEAL BANDING N/A 12/21/2019   Procedure: ESOPHAGEAL BANDING;  Surgeon: Wilford Corner, MD;  [facility];  Service: Endoscopy;  Laterality: N/A;  . ESOPHAGOGASTRODUODENOSCOPY N/A 03/22/2019   Procedure: ESOPHAGOGASTRODUODENOSCOPY (EGD);  Surgeon: Wilford Corner, MD;  [facility];  Service: Endoscopy;  Laterality: N/A;  . ESOPHAGOGASTRODUODENOSCOPY N/A 03/23/2019   Procedure: ESOPHAGOGASTRODUODENOSCOPY (EGD);  Surgeon: Wilford Corner, MD;  Location[facility];  Service: Endoscopy;  Laterality: N/A;  . ESOPHAGOGASTRODUODENOSCOPY (EGD) WITH PROPOFOL  N/A 07/23/2019   Procedure: ESOPHAGOGASTRODUODENOSCOPY (EGD) WITH PROPOFOL with possible banding;  Surgeon: Wilford Corner, MD;  [facility];  Service: Endoscopy;  Laterality: N/A;  . ESOPHAGOGASTRODUODENOSCOPY (EGD) WITH PROPOFOL N/A 09/20/2019   Procedure: ESOPHAGOGASTRODUODENOSCOPY (EGD) WITH PROPOFOL;  Surgeon: Wilford Corner, MD;  [facility];  Service: Endoscopy;  Laterality: N/A;  . ESOPHAGOGASTRODUODENOSCOPY (EGD) WITH PROPOFOL N/A 12/21/2019   Procedure: ESOPHAGOGASTRODUODENOSCOPY (EGD) WITH PROPOFOL with possible banding;  Surgeon: Wilford Corner, MD;  [facility];  Service: Endoscopy;  Laterality: N/A;  . TUBAL LIGATION  Jul 25, 2000   Social History   Socioeconomic History  . Marital status: Married    Spouse name: Not on file  . Number of children: Not on file  . Years of education: Not on file  . Highest education level: Not on file  Occupational History  . Not on file  Tobacco Use  . Smoking status: Never Smoker  . Smokeless tobacco: Never Used  Vaping Use  . Vaping Use: Never used  Substance and Sexual Activity  . Alcohol use: Yes    Comment: rarely-last alcohol drank in 08/2018  . Drug use: No  . Sexual activity: Yes  Other Topics Concern  . Not on file  Social History Narrative  . Not on file   Social Determinants of Health   Financial Resource Strain:   . Difficulty of Paying Living Expenses:   Food Insecurity:   . Worried About Charity fundraiser in the Last Year:   . [place] in the Last Year:  Transportation Needs:   . Film/video editor (Medical):   Marland Kitchen Lack of Transportation (Non-Medical):   Physical Activity:   . Days of Exercise per Week:   . Minutes of Exercise per Session:   Stress:   . Feeling of Stress :   Social Connections:   . Frequency of Communication with Friends and Family:   . Frequency of Social Gatherings with Friends and Family:   . Attends Religious Services:   . Active  Member of Clubs or Organizations:   . Attends Archivist Meetings:   Marland Kitchen Marital Status:    Family History  Problem Relation Age of Onset  . COPD Mother   . Esophageal cancer Mother   . Diabetes Mother   . Cancer Mother   . Hypertension Mother   . Diabetes Father   . Breast cancer Neg Hx    Allergies  Allergen Reactions  . Nsaids     GI BLEED   Prior to Admission medications   Medication Sig Start Date End Date Taking? Authorizing Provider  acetaminophen (TYLENOL) 650 MG CR tablet Take 1,300 mg by mouth every 8 (eight) hours as needed for pain.    Yes [provider]  amitriptyline (ELAVIL) 25 MG tablet Take 1 tablet (25 mg total) by mouth at bedtime. 03/07/20 06/05/20 Yes Gildardo Pounds, NP  amLODipine (NORVASC) 5 MG tablet Take 0.5 tablets (2.5 mg total) by mouth daily. 05/02/20  Yes Charlott Rakes, MD  blood glucose meter kit and supplies KIT Dispense based on patient and insurance preference. Use up to four times daily as directed. (FOR ICD-9 250.00, 250.01). Dispense [place] 03/29/19  Yes Sheikh, Omair Latif, DO  ferrous sulfate 325 (65 FE) MG tablet Take 1 tablet (325 mg total) by mouth 2 (two) times daily with a meal. 03/29/19  Yes Sheikh, Omair Latif, DO  fluticasone (FLONASE) 50 MCG/ACT nasal spray Place 1 spray into both nostrils daily as needed for allergies (ear pressure). 03/08/20  Yes Gildardo Pounds, NP  glucose blood (CONTOUR NEXT TEST) test strip USE UP TO FOUR TIMES DAILY AS DIRECTED 01/31/20  Yes Newlin, Enobong, MD  ketoconazole (NIZORAL) 2 % cream Apply 1 application topically daily. Patient taking differently: Apply 1 application topically daily as needed for irritation.  08/13/19  Yes Gildardo Pounds, NP  losartan-hydrochlorothiazide (HYZAAR) 100-25 MG tablet Take 1 tablet by mouth daily. 03/07/20  Yes Gildardo Pounds, NP  metFORMIN (GLUCOPHAGE) 500 MG tablet Take 1 tablet (500 mg total) by mouth 2 (two) times daily with a meal. 03/07/20 06/05/20 Yes  Gildardo Pounds, NP  Microlet Lancets MISC USE UP TO FOUR TIMES DAILY AS DIRECTED 01/31/20  Yes Charlott Rakes, MD  Norgestimate-Ethinyl Estradiol Triphasic (ORTHO TRI-CYCLEN, 28,) 0.18/0.215/0.25 MG-35 MCG tablet Take 1 tablet by mouth daily. 12/19/19  Yes Gildardo Pounds, NP  pantoprazole (PROTONIX) 40 MG tablet TAKE 1 TABLET(40 MG) BY MOUTH DAILY 04/11/20  Yes Gildardo Pounds, NP  propranolol (INDERAL) 20 MG tablet Take 1 tablet (20 mg total) by mouth 2 (two) times daily. 04/11/20  Yes Gildardo Pounds, NP     Positive ROS: Otherwise negative  All other systems have been reviewed and were otherwise negative with the exception of those mentioned in the HPI and as above.  Physical Exam: Constitutional: Alert, well-appearing, no acute distress Ears: External ears without lesions or tenderness. Ear canals are clear bilaterally.  Right ear canal particularly is clear.  Right TM is clear with good mobility  on pneumatic otoscopy.  On hearing screening with the 512 1024 tuning fork she heard well in both ears with symmetric hearing.  She heard the 1024 tuning fork well in the right ear with AC > BC bilaterally. Nasal: External nose without lesions.. Clear nasal passages Oral: Lips and gums without lesions. Tongue and palate mucosa without lesions. Posterior oropharynx clear. Neck: No palpable adenopathy or masses.  On palpation of the TMJ joints she has pain in the right TMJ joint on opening her mouth with small clicking.  She has no pain on the left side. Respiratory: Breathing comfortably  Skin: No facial/neck lesions or rash noted.  Procedures  Assessment: Chronic right ear pain I suspect is secondary to right TMJ dysfunction. On screening hearing test with tuning fork she had good hearing in both ears.  Her right ear canal and right TM are normal.  Plan: Recommended a soft diet for the next couple of weeks. Normally I would treat TMJ dysfunction with NSAIDs however she states that she is  allergic to NSAIDs and that they contributed to her bad GI bleed last year. Instead I prescribed prednisone 6-day taper starting with 60 mg.  Cautioned her that the prednisone will elevate her blood sugar as she is a diabetic and that she should monitor her blood sugars which she already does. Would recommend further evaluation and treatment with her dentist concerning TMJ dysfunction. Not sure audiogram is necessary although if she continues to complain of decreased hearing would recommend scheduling audiologic testing but was unable to schedule hearing test today.   Radene Journey, MD   CC:

## 2020-05-14 ENCOUNTER — Other Ambulatory Visit: Payer: Self-pay | Admitting: Family Medicine

## 2020-05-14 DIAGNOSIS — K219 Gastro-esophageal reflux disease without esophagitis: Secondary | ICD-10-CM

## 2020-05-15 ENCOUNTER — Ambulatory Visit (INDEPENDENT_AMBULATORY_CARE_PROVIDER_SITE_OTHER): Payer: 59 | Admitting: Physician Assistant

## 2020-05-15 ENCOUNTER — Other Ambulatory Visit: Payer: Self-pay

## 2020-05-15 ENCOUNTER — Ambulatory Visit: Payer: Self-pay

## 2020-05-15 ENCOUNTER — Ambulatory Visit (INDEPENDENT_AMBULATORY_CARE_PROVIDER_SITE_OTHER): Payer: 59

## 2020-05-15 ENCOUNTER — Encounter: Payer: Self-pay | Admitting: Physician Assistant

## 2020-05-15 DIAGNOSIS — M25562 Pain in left knee: Secondary | ICD-10-CM

## 2020-05-15 DIAGNOSIS — G8929 Other chronic pain: Secondary | ICD-10-CM

## 2020-05-15 DIAGNOSIS — M25561 Pain in right knee: Secondary | ICD-10-CM | POA: Diagnosis not present

## 2020-05-15 MED ORDER — LIDOCAINE HCL 1 % IJ SOLN
3.0000 mL | INTRAMUSCULAR | Status: AC | PRN
  Administered 2020-05-15: 3 mL

## 2020-05-15 MED ORDER — METHYLPREDNISOLONE ACETATE 40 MG/ML IJ SUSP
40.0000 mg | INTRAMUSCULAR | Status: AC | PRN
  Administered 2020-05-15: 40 mg via INTRA_ARTICULAR

## 2020-05-15 NOTE — Progress Notes (Signed)
Office Visit Note   Patient: Angela Castillo           Date of Birth: 12-Feb-1976           MRN: [id number] Visit Date: 05/15/2020              Requested by: Gildardo Pounds, NP [place],  [place] [id number] PCP: Gildardo Pounds, NP   Assessment & Plan: Visit Diagnoses:  1. Left knee pain, unspecified chronicity   2. Right knee pain, unspecified chronicity     Plan: She will monitor her glucose levels over the next couple of days.  She will follow-up with [place] in 2 weeks if she still having pain in the left knee at that time consider steroid injection.  She will work on quad strengthening both knees the exercises shown today.  Questions were encouraged and answered.  Follow-Up Instructions: Return in about 2 weeks (around 05/29/2020).   Orders:  Orders Placed This Encounter  Procedures  . Large Joint Inj  . XR Knee 1-2 Views Right  . XR Knee 1-2 Views Left   No orders of the defined types were placed in this encounter.     Procedures: Large Joint Inj: R knee on 05/15/2020 4:44 PM Indications: pain Details: 22 G 1.5 in needle, anterolateral approach  Arthrogram: No  Medications: 3 mL lidocaine 1 %; 40 mg methylPREDNISolone acetate 40 MG/ML Outcome: tolerated well, no immediate complications Procedure, treatment alternatives, risks and benefits explained, specific risks discussed. Consent was given by the patient. Immediately prior to procedure a time out was called to verify the correct patient, procedure, equipment, support staff and site/side marked as required. Patient was prepped and draped in the usual sterile fashion.       Clinical Data: No additional findings.   Subjective: Chief Complaint  Patient presents with  . Right Knee - Pain  . Left Knee - Pain    HPI Angela Castillo comes in today for bilateral knee pain.  She states right knee pain is worse than the left.  Pain has been ongoing for the past 2 to 3 months.  Pains been  constant over the last month in both knees.  No known injury to either knee.  Painful.  On the stairs.  She has no mechanical symptoms in either knee.  She had no fevers chills shortness of breath chest pain.  Reports her diabetes is under good control.  Her last hemoglobin A1c 2 months ago was 5.2.  Review of Systems Please see HPI otherwise negative territory.  Objective: Vital Signs: There were no vitals taken for this visit.  Physical Exam Constitutional:      Appearance: She is not ill-appearing or diaphoretic.  Pulmonary:     Effort: Pulmonary effort is normal.  Neurological:     Mental Status: She is alert and oriented to person, place, and time.  Psychiatric:        Mood and Affect: Mood normal.     Ortho Exam Bilateral knees full range of motion no abnormal warmth erythema or effusion of either knee.  Mild knees with slight patellofemoral crepitus.  Tenderness along medial joint line right knee only.  No instability with valgus varus stressing of either knee.  McMurray's is negative bilaterally. Specialty Comments:  No specialty comments available.  Imaging: XR Knee 1-2 Views Left  Result Date: 05/15/2020 Left knee 2 views: No acute fractures.  Mild patellofemoral changes and medial compartmental narrowing.  Lateral compartment  is well-preserved.  Knee is well located.  XR Knee 1-2 Views Right  Result Date: 05/15/2020 Right knee: 2 views showed mild to moderate narrowing medial joint line and mild patellofemoral changes.  Lateral compartment is well-preserved.  No acute fractures or bony abnormalities otherwise.  Knee is well located.    PMFS History: Patient Active Problem List   Diagnosis Date Noted  . Ingrown toenail 10/16/2019  . Cirrhosis ([place]) 07/23/2019  . Esophageal varices ([place]) 07/23/2019  . Diabetes mellitus ([place]) 04/20/2019  . Acute respiratory insufficiency   . Respiratory failure ([place])   . Shock circulatory ([place])   . H/O: upper GI bleed 03/21/2019    . Impingement syndrome of left shoulder 04/21/2017  . Low back pain 03/18/2016  . Left shoulder pain 03/18/2016  . Esophageal reflux 03/18/2016  . Lateral pain of left hip 10/25/2015  . Heel pain, bilateral 05/03/2015  . Obesity (BMI 30-39.9) 05/03/2015  . Hearing loss in right ear 03/01/2015  . Family history of diabetes mellitus in mother 03/01/2015  . Essential hypertension 01/18/2015  . Anemia, iron deficiency 01/04/2015  . Musculoskeletal chest pain 01/04/2015  . Thrombocytopenia ([place]) 01/04/2015  . Transaminitis 01/03/2015   Past Medical History:  Diagnosis Date  . Anemia Dx January 03 2015  . Diabetes mellitus without complication ([place])   . Elevated liver enzymes   . Essential hypertension Dx Apr 2016  . Fatty liver   . H/O: upper GI bleed 03/2019    variceal bleed   . History of blood transfusion 03/2019  . Liver cirrhosis secondary to NASH ([place])   . Pneumonia 03/2019  . Reflux   . Thrombocytopenia ([place])     Family History  Problem Relation Age of Onset  . COPD Mother   . Esophageal cancer Mother   . Diabetes Mother   . Cancer Mother   . Hypertension Mother   . Diabetes Father   . Breast cancer Neg Hx     Past Surgical History:  Procedure Laterality Date  . ESOPHAGEAL BANDING  03/22/2019   Procedure: ESOPHAGEAL BANDING;  Surgeon: Wilford Corner, MD;  [facility];  Service: Endoscopy;;  . ESOPHAGEAL BANDING  03/23/2019   Procedure: ESOPHAGEAL BANDING;  Surgeon: Wilford Corner, MD;  [facility];  Service: Endoscopy;;  . ESOPHAGEAL BANDING  09/20/2019   Procedure: ESOPHAGEAL BANDING;  Surgeon: Wilford Corner, MD;  [facility];  Service: Endoscopy;;  . ESOPHAGEAL BANDING N/A 12/21/2019   Procedure: ESOPHAGEAL BANDING;  Surgeon: Wilford Corner, MD;  [facility];  Service: Endoscopy;  Laterality: N/A;  . ESOPHAGOGASTRODUODENOSCOPY N/A 03/22/2019   Procedure: ESOPHAGOGASTRODUODENOSCOPY (EGD);  Surgeon: Wilford Corner, MD;  [facility];  Service: Endoscopy;  Laterality: N/A;  . ESOPHAGOGASTRODUODENOSCOPY N/A 03/23/2019   Procedure: ESOPHAGOGASTRODUODENOSCOPY (EGD);  Surgeon: Wilford Corner, MD;  [facility];  Service: Endoscopy;  Laterality: N/A;  . ESOPHAGOGASTRODUODENOSCOPY (EGD) WITH PROPOFOL N/A 07/23/2019   Procedure: ESOPHAGOGASTRODUODENOSCOPY (EGD) WITH PROPOFOL with possible banding;  Surgeon: Wilford Corner, MD;  [facility];  Service: Endoscopy;  Laterality: N/A;  . ESOPHAGOGASTRODUODENOSCOPY (EGD) WITH PROPOFOL N/A 09/20/2019   Procedure: ESOPHAGOGASTRODUODENOSCOPY (EGD) WITH PROPOFOL;  Surgeon: Wilford Corner, MD;  [facility];  Service: Endoscopy;  Laterality: N/A;  . ESOPHAGOGASTRODUODENOSCOPY (EGD) WITH PROPOFOL N/A 12/21/2019   Procedure: ESOPHAGOGASTRODUODENOSCOPY (EGD) WITH PROPOFOL with possible banding;  Surgeon: Wilford Corner, MD;  [facility];  Service: Endoscopy;  Laterality: N/A;  . TUBAL LIGATION  Jul 25, 2000   Social History  Occupational History  . Not on file  Tobacco Use  . Smoking status: Never Smoker  . Smokeless tobacco: Never Used  Vaping Use  . Vaping Use: Never used  Substance and Sexual Activity  . Alcohol use: Yes    Comment: rarely-last alcohol drank in 08/2018  . Drug use: No  . Sexual activity: Yes

## 2020-05-24 ENCOUNTER — Encounter: Payer: Self-pay | Admitting: Nurse Practitioner

## 2020-05-25 ENCOUNTER — Other Ambulatory Visit: Payer: Self-pay | Admitting: Family Medicine

## 2020-05-25 DIAGNOSIS — D508 Other iron deficiency anemias: Secondary | ICD-10-CM

## 2020-05-25 DIAGNOSIS — IMO0002 Reserved for concepts with insufficient information to code with codable children: Secondary | ICD-10-CM

## 2020-05-25 DIAGNOSIS — E1165 Type 2 diabetes mellitus with hyperglycemia: Secondary | ICD-10-CM

## 2020-05-25 DIAGNOSIS — R519 Headache, unspecified: Secondary | ICD-10-CM

## 2020-05-25 DIAGNOSIS — K219 Gastro-esophageal reflux disease without esophagitis: Secondary | ICD-10-CM

## 2020-05-25 DIAGNOSIS — I10 Essential (primary) hypertension: Secondary | ICD-10-CM

## 2020-05-25 MED ORDER — AMLODIPINE BESYLATE 5 MG PO TABS: 2.5000 mg | ORAL_TABLET | Freq: Every day | ORAL | 1 refills | Status: DC

## 2020-05-25 MED ORDER — PROPRANOLOL HCL 20 MG PO TABS: 20.0000 mg | ORAL_TABLET | Freq: Two times a day (BID) | ORAL | 1 refills | Status: DC

## 2020-05-25 MED ORDER — PANTOPRAZOLE SODIUM 40 MG PO TBEC: DELAYED_RELEASE_TABLET | ORAL | 1 refills | Status: DC

## 2020-05-25 MED ORDER — AMITRIPTYLINE HCL 25 MG PO TABS: 25.0000 mg | ORAL_TABLET | Freq: Every day | ORAL | 1 refills | Status: DC

## 2020-05-25 MED ORDER — LOSARTAN POTASSIUM-HCTZ 100-25 MG PO TABS: 1.0000 | ORAL_TABLET | Freq: Every day | ORAL | 1 refills | Status: DC

## 2020-05-25 MED ORDER — METFORMIN HCL 500 MG PO TABS: 500.0000 mg | ORAL_TABLET | Freq: Two times a day (BID) | ORAL | 1 refills | Status: DC

## 2020-05-25 MED ORDER — FERROUS SULFATE 325 (65 FE) MG PO TABS: 325.0000 mg | ORAL_TABLET | Freq: Two times a day (BID) | ORAL | 1 refills | Status: DC

## 2020-06-02 ENCOUNTER — Other Ambulatory Visit: Payer: Self-pay | Admitting: Nurse Practitioner

## 2020-06-02 DIAGNOSIS — H6121 Impacted cerumen, right ear: Secondary | ICD-10-CM

## 2020-06-02 DIAGNOSIS — R519 Headache, unspecified: Secondary | ICD-10-CM

## 2020-06-05 ENCOUNTER — Ambulatory Visit (INDEPENDENT_AMBULATORY_CARE_PROVIDER_SITE_OTHER): Payer: 59 | Admitting: Physician Assistant

## 2020-06-05 ENCOUNTER — Encounter: Payer: Self-pay | Admitting: Physician Assistant

## 2020-06-05 DIAGNOSIS — M25561 Pain in right knee: Secondary | ICD-10-CM

## 2020-06-05 DIAGNOSIS — M25562 Pain in left knee: Secondary | ICD-10-CM | POA: Diagnosis not present

## 2020-06-05 NOTE — Progress Notes (Signed)
Office Visit Note   Patient: Angela Castillo           Date of Birth: 05/10/1976           MRN: [id number] Visit Date: 06/05/2020              Requested by: Gildardo Pounds, NP [place],  [place] [id number] PCP: Gildardo Pounds, NP   Assessment & Plan: Visit Diagnoses:  1. Left knee pain, unspecified chronicity   2. Right knee pain, unspecified chronicity     Plan: Due to the fact she has no mechanical symptoms of either knee mostly have his pain with going up stairs and given her clinical exam which is consistent with patellofemoral syndrome recommend physical therapy.  She is unable to take NSAIDs due to history of GI bleed.  We will see how she does with formal physical therapy for quad strengthening home exercise program.  Have her follow-up in 6 weeks see what type of response she had.  Due to the fact that she had no real prolonged relief with the right knee injection we held off on injecting the left knee today.  Follow-Up Instructions: Return in about 6 weeks (around 07/17/2020).   Orders:  No orders of the defined types were placed in this encounter.  No orders of the defined types were placed in this encounter.     Procedures: No procedures performed   Clinical Data: No additional findings.   Subjective: Chief Complaint  Patient presents with   Right Knee - Follow-up   Left Knee - Follow-up    HPI Angela Castillo returns today 2 weeks status post right knee injection.  She states she had good relief with the injection for 3 days.  Still having anterior medial knee pain.  Right knee pain is worse than the left.  She has had no mechanical symptoms of either knee.  No swelling.  She denies her pain is worse with going up stairs.  She is taking Tylenol due to the pain.  Unable to take NSAIDs due to history of GI bleed.  Review of Systems See HPI  Objective: Vital Signs: There were no vitals taken for this visit.  Physical Exam Pulmonary:      Effort: Pulmonary effort is normal.  Neurological:     Mental Status: She is alert and oriented to person, place, and time.  Psychiatric:        Mood and Affect: Mood normal.     Ortho Exam Bilateral knees full range of motion.  No instability valgus varus stressing of either knee.  No abnormal warmth erythema or effusion of either knee.  [place] Pheonix Clinkscale positive bilateral knees.  Slight tenderness bilateral knees medial joint line. Specialty Comments:  No specialty comments available.  Imaging: No results found.   PMFS History: Patient Active Problem List   Diagnosis Date Noted   Ingrown toenail 10/16/2019   Cirrhosis ([place]) 07/23/2019   Esophageal varices ([place]) 07/23/2019   Diabetes mellitus ([place]) 04/20/2019   Acute respiratory insufficiency    Respiratory failure ([place])    Shock circulatory ([facility])    H/O: upper GI bleed 03/21/2019   Impingement syndrome of left shoulder 04/21/2017   Low back pain 03/18/2016   Left shoulder pain 03/18/2016   Esophageal reflux 03/18/2016   Lateral pain of left hip 10/25/2015   Heel pain, bilateral 05/03/2015   Obesity (BMI 30-39.9) 05/03/2015   Hearing loss in right ear 03/01/2015   Family history  of diabetes mellitus in mother 03/01/2015   Essential hypertension 01/18/2015   Anemia, iron deficiency 01/04/2015   Musculoskeletal chest pain 01/04/2015   Thrombocytopenia ([place]) 01/04/2015   Transaminitis 01/03/2015   Past Medical History:  Diagnosis Date   Anemia Dx January 03 2015   Diabetes mellitus without complication ([facility])    Elevated liver enzymes    Essential hypertension Dx Apr 2016   Fatty liver    H/O: upper GI bleed 03/2019    variceal bleed    History of blood transfusion 03/2019   Liver cirrhosis secondary to NASH ([place])    Pneumonia 03/2019   Reflux    Thrombocytopenia ([facility])     Family History  Problem Relation Age of Onset   COPD Mother    Esophageal cancer Mother    Diabetes Mother      Cancer Mother    Hypertension Mother    Diabetes Father    Breast cancer Neg Hx     Past Surgical History:  Procedure Laterality Date   ESOPHAGEAL BANDING  03/22/2019   Procedure: ESOPHAGEAL BANDING;  Surgeon: Wilford Corner, MD;  [facility];  Service: Endoscopy;;   ESOPHAGEAL BANDING  03/23/2019   Procedure: ESOPHAGEAL BANDING;  Surgeon: Wilford Corner, MD;  [facility];  Service: Endoscopy;;   ESOPHAGEAL BANDING  09/20/2019   Procedure: ESOPHAGEAL BANDING;  Surgeon: Wilford Corner, MD;  [facility];  Service: Endoscopy;;   ESOPHAGEAL BANDING N/A 12/21/2019   Procedure: ESOPHAGEAL BANDING;  Surgeon: Wilford Corner, MD;  [facility];  Service: Endoscopy;  Laterality: N/A;   ESOPHAGOGASTRODUODENOSCOPY N/A 03/22/2019   Procedure: ESOPHAGOGASTRODUODENOSCOPY (EGD);  Surgeon: Wilford Corner, MD;  [facility];  Service: Endoscopy;  Laterality: N/A;   ESOPHAGOGASTRODUODENOSCOPY N/A 03/23/2019   Procedure: ESOPHAGOGASTRODUODENOSCOPY (EGD);  Surgeon: Wilford Corner, MD;  [facility];  Service: Endoscopy;  Laterality: N/A;   ESOPHAGOGASTRODUODENOSCOPY (EGD) WITH PROPOFOL N/A 07/23/2019   Procedure: ESOPHAGOGASTRODUODENOSCOPY (EGD) WITH PROPOFOL with possible banding;  Surgeon: Wilford Corner, MD;  [facility];  Service: Endoscopy;  Laterality: N/A;   ESOPHAGOGASTRODUODENOSCOPY (EGD) WITH PROPOFOL N/A 09/20/2019   Procedure: ESOPHAGOGASTRODUODENOSCOPY (EGD) WITH PROPOFOL;  Surgeon: Wilford Corner, MD;  [facility];  Service: Endoscopy;  Laterality: N/A;   ESOPHAGOGASTRODUODENOSCOPY (EGD) WITH PROPOFOL N/A 12/21/2019   Procedure: ESOPHAGOGASTRODUODENOSCOPY (EGD) WITH PROPOFOL with possible banding;  Surgeon: Wilford Corner, MD;  [facility];  Service: Endoscopy;  Laterality: N/A;   TUBAL LIGATION  Jul 25, 2000   Social History   Occupational History   Not on file   Tobacco Use   Smoking status: Never Smoker   Smokeless tobacco: Never Used  Vaping Use   Vaping Use: Never used  Substance and Sexual Activity   Alcohol use: Yes    Comment: rarely-last alcohol drank in 08/2018   Drug use: No   Sexual activity: Yes

## 2020-06-07 ENCOUNTER — Encounter: Payer: Self-pay | Admitting: Nurse Practitioner

## 2020-06-07 ENCOUNTER — Ambulatory Visit: Payer: 59 | Attending: Nurse Practitioner | Admitting: Nurse Practitioner

## 2020-06-07 ENCOUNTER — Other Ambulatory Visit: Payer: Self-pay

## 2020-06-07 DIAGNOSIS — I10 Essential (primary) hypertension: Secondary | ICD-10-CM | POA: Diagnosis not present

## 2020-06-07 DIAGNOSIS — K219 Gastro-esophageal reflux disease without esophagitis: Secondary | ICD-10-CM

## 2020-06-07 MED ORDER — PANTOPRAZOLE SODIUM 40 MG PO TBEC: 40.0000 mg | DELAYED_RELEASE_TABLET | Freq: Every day | ORAL | 1 refills | Status: DC

## 2020-06-07 MED ORDER — PROPRANOLOL HCL 20 MG PO TABS: 20.0000 mg | ORAL_TABLET | Freq: Two times a day (BID) | ORAL | 1 refills | Status: DC

## 2020-06-07 MED ORDER — AMLODIPINE BESYLATE 5 MG PO TABS: 2.5000 mg | ORAL_TABLET | Freq: Every day | ORAL | 1 refills | Status: DC

## 2020-06-07 NOTE — Progress Notes (Signed)
Virtual Visit via Telephone Note Due to national recommendations of social distancing due to [place] 19, telehealth visit is felt to be most appropriate for this patient at this time.  I discussed the limitations, risks, security and privacy concerns of performing an evaluation and management service by telephone and the availability of in person appointments. I also discussed with the patient that there may be a patient responsible charge related to this service. The patient expressed understanding and agreed to proceed.    I connected with Angela Castillo on 06/07/20  at   4:10 PM EDT  EDT by telephone and verified that I am speaking with the correct person using two identifiers.   Consent I discussed the limitations, risks, security and privacy concerns of performing an evaluation and management service by telephone and the availability of in person appointments. I also discussed with the patient that there may be a patient responsible charge related to this service. The patient expressed understanding and agreed to proceed.   Location of Patient: Private Residence   Location of Provider: [place] participating in Telemedicine visit: [name] FNP-BC [place]    History of Present Illness: Telemedicine visit for: F/U  Feels like she has a pinched nerve in her neck due to numbness in the left arm and radiating down in pinky and ring finger.   DM TYPE 2 Well controlled. Has not been monitoring her blood glucose levels over the past several days. She can not recall any recent readings. States highest reading she can call was 130 something. Endorses adherence taking metformin 500 mg BID.  Lab Results  Component Value Date   HGBA1C 5.2 03/07/2020   Lab Results  Component Value Date   LDLCALC 105 (H) 06/25/2019    Essential Hypertension Well controlled. Currently endorses medication adherence taking  amlodipine 5 mg daily and hyzaar 100-25 mg daily. Denies chest pain, shortness of breath, palpitations, lightheadedness, dizziness, headaches or BLE edema.  BP Readings from Last 3 Encounters:  05/02/20 120/82  04/12/20 (!) 145/82  03/22/20 (!) 146/98        Past Medical History:  Diagnosis Date   Anemia Dx January 03 2015   Diabetes mellitus without complication ([place])    Elevated liver enzymes    Essential hypertension Dx Apr 2016   Fatty liver    H/O: upper GI bleed 03/2019    variceal bleed    History of blood transfusion 03/2019   Liver cirrhosis secondary to NASH ([place])    Pneumonia 03/2019   Reflux    Thrombocytopenia ([place])     Past Surgical History:  Procedure Laterality Date   ESOPHAGEAL BANDING  03/22/2019   Procedure: ESOPHAGEAL BANDING;  Surgeon: Wilford Corner, MD;  Location: [facility];  Service: Endoscopy;;   ESOPHAGEAL BANDING  03/23/2019   Procedure: ESOPHAGEAL BANDING;  Surgeon: Wilford Corner, MD;  Location: [facility];  Service: Endoscopy;;   ESOPHAGEAL BANDING  09/20/2019   Procedure: ESOPHAGEAL BANDING;  Surgeon: Wilford Corner, MD;  Location: [facility];  Service: Endoscopy;;   ESOPHAGEAL BANDING N/A 12/21/2019   Procedure: ESOPHAGEAL BANDING;  Surgeon: Wilford Corner, MD;  Location: [facility];  Service: Endoscopy;  Laterality: N/A;   ESOPHAGOGASTRODUODENOSCOPY N/A 03/22/2019   Procedure: ESOPHAGOGASTRODUODENOSCOPY (EGD);  Surgeon: Wilford Corner, MD;  Location: [facility];  Service: Endoscopy;  Laterality: N/A;   ESOPHAGOGASTRODUODENOSCOPY N/A 03/23/2019   Procedure: ESOPHAGOGASTRODUODENOSCOPY (EGD);  Surgeon: Wilford Corner, MD;  Location: [facility];  Service: Endoscopy;  Laterality: N/A;   ESOPHAGOGASTRODUODENOSCOPY (EGD) WITH PROPOFOL N/A 07/23/2019   Procedure: ESOPHAGOGASTRODUODENOSCOPY (EGD) WITH PROPOFOL with possible banding;  Surgeon: Wilford Corner, MD;  Location: [facility];  Service: Endoscopy;   Laterality: N/A;   ESOPHAGOGASTRODUODENOSCOPY (EGD) WITH PROPOFOL N/A 09/20/2019   Procedure: ESOPHAGOGASTRODUODENOSCOPY (EGD) WITH PROPOFOL;  Surgeon: Wilford Corner, MD;  Location: [facility];  Service: Endoscopy;  Laterality: N/A;   ESOPHAGOGASTRODUODENOSCOPY (EGD) WITH PROPOFOL N/A 12/21/2019   Procedure: ESOPHAGOGASTRODUODENOSCOPY (EGD) WITH PROPOFOL with possible banding;  Surgeon: Wilford Corner, MD;  Location: [facility];  Service: Endoscopy;  Laterality: N/A;   TUBAL LIGATION  Jul 25, 2000    Family History  Problem Relation Age of Onset   COPD Mother    Esophageal cancer Mother    Diabetes Mother    Cancer Mother    Hypertension Mother    Diabetes Father    Breast cancer Neg Hx     Social History   Socioeconomic History   Marital status: Married    Spouse name: Not on file   Number of children: Not on file   Years of education: Not on file   Highest education level: Not on file  Occupational History   Not on file  Tobacco Use   Smoking status: Never Smoker   Smokeless tobacco: Never Used  Vaping Use   Vaping Use: Never used  Substance and Sexual Activity   Alcohol use: Yes    Comment: rarely-last alcohol drank in 08/2018   Drug use: No   Sexual activity: Yes  Other Topics Concern   Not on file  Social History Narrative   Not on file   Social Determinants of Health   Financial Resource Strain:    Difficulty of Paying Living Expenses:   Food Insecurity:    Worried About Charity fundraiser in the Last Year:    Arboriculturist in the Last Year:   Transportation Needs:    Film/video editor (Medical):    Lack of Transportation (Non-Medical):   Physical Activity:    Days of Exercise per Week:    Minutes of Exercise per Session:   Stress:    Feeling of Stress :   Social Connections:    Frequency of Communication with Friends and Family:    Frequency of Social Gatherings with Friends and Family:    Attends  Religious Services:    Active Member of Clubs or Organizations:    Attends Music therapist:    Marital Status:      Observations/Objective: Awake, alert and oriented x 3   Review of Systems  Constitutional: Negative for fever, malaise/fatigue and weight loss.  HENT: Negative.  Negative for nosebleeds.   Eyes: Negative.  Negative for blurred vision, double vision and photophobia.  Respiratory: Negative.  Negative for cough and shortness of breath.   Cardiovascular: Negative.  Negative for chest pain, palpitations and leg swelling.  Gastrointestinal: Negative.  Negative for heartburn, nausea and vomiting.  Musculoskeletal: Negative.  Negative for myalgias.  Neurological: Positive for headaches (improving). Negative for dizziness, focal weakness and seizures.  Psychiatric/Behavioral: Negative.  Negative for suicidal ideas.    Assessment and Plan: Adeli was seen today for follow-up.  Diagnoses and all orders for this visit:  Essential hypertension -     amLODipine (NORVASC) 5 MG tablet; Take 0.5 tablets (2.5 mg total) by mouth daily. Continue all antihypertensives as prescribed.  Remember to bring in your blood pressure log with  you for your follow up appointment.  DASH/Mediterranean Diets are healthier choices for HTN.    GERD without esophagitis -     pantoprazole (PROTONIX) 40 MG tablet; Take 1 tablet (40 mg total) by mouth daily. -     propranolol (INDERAL) 20 MG tablet; Take 1 tablet (20 mg total) by mouth 2 (two) times daily. INSTRUCTIONS: Avoid GERD Triggers: acidic, spicy or fried foods, caffeine, coffee, sodas,  alcohol and chocolate.     Follow Up Instructions Return in about 3 months (around 09/07/2020).     I discussed the assessment and treatment plan with the patient. The patient was provided an opportunity to ask questions and all were answered. The patient agreed with the plan and demonstrated an understanding of the instructions.   The  patient was advised to call back or seek an in-person evaluation if the symptoms worsen or if the condition fails to improve as anticipated.  I provided 19 minutes of non-face-to-face time during this encounter including median intraservice time, reviewing previous notes, labs, imaging, medications and explaining diagnosis and management.  Gildardo Pounds, FNP-BC

## 2020-06-08 ENCOUNTER — Encounter: Payer: Self-pay | Admitting: Nurse Practitioner

## 2020-06-13 ENCOUNTER — Other Ambulatory Visit: Payer: Self-pay | Admitting: Nurse Practitioner

## 2020-06-13 ENCOUNTER — Other Ambulatory Visit: Payer: Self-pay

## 2020-06-13 ENCOUNTER — Ambulatory Visit: Payer: 59 | Attending: Nurse Practitioner

## 2020-06-13 DIAGNOSIS — IMO0002 Reserved for concepts with insufficient information to code with codable children: Secondary | ICD-10-CM

## 2020-06-13 DIAGNOSIS — D508 Other iron deficiency anemias: Secondary | ICD-10-CM

## 2020-06-13 DIAGNOSIS — E1165 Type 2 diabetes mellitus with hyperglycemia: Secondary | ICD-10-CM

## 2020-06-13 DIAGNOSIS — I10 Essential (primary) hypertension: Secondary | ICD-10-CM

## 2020-06-14 LAB — BASIC METABOLIC PANEL
BUN/Creatinine Ratio: 15 (ref 9–23)
BUN: 10 mg/dL (ref 6–24)
CO2: 25 mmol/L (ref 20–29)
Calcium: 9.5 mg/dL (ref 8.7–10.2)
Chloride: 101 mmol/L (ref 96–106)
Creatinine, Ser: 0.65 mg/dL (ref 0.57–1.00)
GFR calc Af Amer: 126 mL/min/{1.73_m2} (ref >59)
GFR calc non Af Amer: 109 mL/min/{1.73_m2} (ref >59)
Glucose: 93 mg/dL (ref 65–99)
Potassium: 4 mmol/L (ref 3.5–5.2)
Sodium: 140 mmol/L (ref 134–144)

## 2020-06-14 LAB — LIPID PANEL
Chol/HDL Ratio: 4.6 ratio — ABNORMAL HIGH (ref 0.0–4.4)
Cholesterol, Total: 190 mg/dL (ref 100–199)
HDL: 41 mg/dL (ref >39)
LDL Chol Calc (NIH): 124 mg/dL — ABNORMAL HIGH (ref 0–99)
Triglycerides: 142 mg/dL (ref 0–149)
VLDL Cholesterol Cal: 25 mg/dL (ref 5–40)

## 2020-06-14 LAB — CBC
Hematocrit: 37.2 % (ref 34.0–46.6)
Hemoglobin: 12.5 g/dL (ref 11.1–15.9)
MCH: 28.3 pg (ref 26.6–33.0)
MCHC: 33.6 g/dL (ref 31.5–35.7)
MCV: 84 fL (ref 79–97)
Platelets: 107 10*3/uL — ABNORMAL LOW (ref 150–450)
RBC: 4.42 x10E6/uL (ref 3.77–5.28)
RDW: 13.4 % (ref 11.7–15.4)
WBC: 7.1 10*3/uL (ref 3.4–10.8)

## 2020-06-14 LAB — HEMOGLOBIN A1C
Est. average glucose Bld gHb Est-mCnc: 111 mg/dL
Hgb A1c MFr Bld: 5.5 % (ref 4.8–5.6)

## 2020-06-15 ENCOUNTER — Other Ambulatory Visit: Payer: Self-pay | Admitting: Nurse Practitioner

## 2020-06-15 DIAGNOSIS — D696 Thrombocytopenia, unspecified: Secondary | ICD-10-CM

## 2020-06-16 ENCOUNTER — Other Ambulatory Visit: Payer: Self-pay | Admitting: Nurse Practitioner

## 2020-06-16 DIAGNOSIS — Z1231 Encounter for screening mammogram for malignant neoplasm of breast: Secondary | ICD-10-CM

## 2020-06-19 ENCOUNTER — Telehealth: Payer: Self-pay | Admitting: Hematology and Oncology

## 2020-06-19 NOTE — Telephone Encounter (Signed)
Scheduled per 8/12 staff msg. Called and spoke with pt, confirmed added appts

## 2020-06-20 ENCOUNTER — Other Ambulatory Visit: Payer: Self-pay | Admitting: Surgery

## 2020-06-26 NOTE — Progress Notes (Signed)
Patient Care Team: Gildardo Pounds, NP as PCP - General (Nurse Practitioner)  DIAGNOSIS:    ICD-10-CM   1. Thrombocytopenia ([place])  D69.6     CHIEF COMPLIANT: Follow-up of thrombocytopenia  INTERVAL HISTORY: Angela Castillo is a 43 y.o. with above-mentioned history of thrombocytopenia. Labs on 06/13/20 showed Hg 12.5, HCT 37.2, platelets 107. She presents to the clinic today for follow-up.   ALLERGIES:  is allergic to nsaids.  MEDICATIONS:  Current Outpatient Medications  Medication Sig Dispense Refill  . acetaminophen (TYLENOL) 650 MG CR tablet Take 1,300 mg by mouth every 8 (eight) hours as needed for pain.     Marland Kitchen amitriptyline (ELAVIL) 25 MG tablet Take 1 tablet (25 mg total) by mouth at bedtime. 90 tablet 1  . amLODipine (NORVASC) 5 MG tablet Take 0.5 tablets (2.5 mg total) by mouth daily. 45 tablet 1  . blood glucose meter kit and supplies KIT Dispense based on patient and insurance preference. Use up to four times daily as directed. (FOR ICD-9 250.00, 250.01). Dispense Relion 1 each 0  . ferrous sulfate 325 (65 FE) MG tablet Take 1 tablet (325 mg total) by mouth 2 (two) times daily with a meal. 180 tablet 1  . fluticasone (FLONASE) 50 MCG/ACT nasal spray SHAKE LIQUID AND USE 1 SPRAY IN EACH NOSTRIL DAILY AS NEEDED FOR ALLERGIES OR EAR PRESSURE 16 g 1  . glucose blood (CONTOUR NEXT TEST) test strip USE UP TO FOUR TIMES DAILY AS DIRECTED 100 strip 6  . losartan-hydrochlorothiazide (HYZAAR) 100-25 MG tablet Take 1 tablet by mouth daily. 90 tablet 1  . metFORMIN (GLUCOPHAGE) 500 MG tablet Take 1 tablet (500 mg total) by mouth 2 (two) times daily with a meal. 180 tablet 1  . Microlet Lancets MISC USE UP TO FOUR TIMES DAILY AS DIRECTED 100 each 11  . Norgestimate-Ethinyl Estradiol Triphasic (ORTHO TRI-CYCLEN, 28,) 0.18/0.215/0.25 MG-35 MCG tablet Take 1 tablet by mouth daily. 1 Package 11  . pantoprazole (PROTONIX) 40 MG tablet Take 1 tablet (40 mg total) by mouth daily. 90 tablet  1  . propranolol (INDERAL) 20 MG tablet Take 1 tablet (20 mg total) by mouth 2 (two) times daily. 180 tablet 1   No current facility-administered medications for this visit.    PHYSICAL EXAMINATION: ECOG PERFORMANCE STATUS: 1 - Symptomatic but completely ambulatory  Vitals:   06/27/20 1514  BP: 124/76  Pulse: 74  Resp: 19  Temp: 98.2 F (36.8 C)  SpO2: 98%   Filed Weights   06/27/20 1514  Weight: 223 lb 14.4 oz (101.6 kg)    LABORATORY DATA:  I have reviewed the data as listed CMP Latest Ref Rng & Units 06/13/2020 03/07/2020 07/13/2019  Glucose 65 - 99 mg/dL 93 98 96  BUN 6 - 24 mg/dL _0 Creatinine 0.57 - 1.00 mg/dL 0.65 0.59 0.53(L)  Sodium 134 - 144 mmol/L 140 142 141  Potassium 3.5 - 5.2 mmol/L 4.0 3.8 3.9  Chloride 96 - 106 mmol/L 101 103 102  CO2 20 - 29 mmol/L _1 Calcium 8.7 - 10.2 mg/dL 9.5 9.3 9.6  Total Protein 6.0 - 8.5 g/dL - 7.6 8.2  Total Bilirubin 0.0 - 1.2 mg/dL - 0.3 0.4  Alkaline Phos 39 - 117 IU/L - 81 133(H)  AST 0 - 40 IU/L - 21 31  ALT 0 - 32 IU/L - 20 38(H)    Lab Results  Component Value Date   WBC 7.1 06/13/2020  HGB 12.5 06/13/2020   HCT 37.2 06/13/2020   MCV 84 06/13/2020   PLT 107 (L) 06/13/2020   NEUTROABS 4.7 07/20/2019    ASSESSMENT & PLAN:  Thrombocytopenia ([place]) Originally started August 2015: Platelet count 130 01/03/2015: Hemoglobin 6.4: Required blood transfusion and was found to be iron deficient 05/01/2017: Platelet count relatively stable 147 September 2019: Platelet count 110 03/21/2019: Platelet count 94 during hospitalization for cirrhosis and gastric variceal bleeding status post banding complicated by aspiration pneumonia 03/29/2019 at the time of discharge platelet count 104 06/25/2019: Platelet count 74 06/13/2020: Platelets 107  Differential diagnosis: Cirrhosis and splenomegaly related thrombocytopenia versus ITP Previous work-up has been negative hepatitis B, hepatitis C, HIV  Patient has chronic mild  thrombocytopenia around 100-130 range probably due to cirrhosis.  Platelet counts are relatively stable. Continue watchful monitoring with a follow-up in 1 year.    No orders of the defined types were placed in this encounter.  The patient has a good understanding of the overall plan. she agrees with it. she will call with any problems that may develop before the next visit here.  Total time spent: 20 mins including face to face time and time spent for planning, charting and coordination of care  Nicholas Lose, MD 06/27/2020  I, Cloyde Reams Dorshimer, am acting as scribe for Dr. Nicholas Lose.  I have reviewed the above documentation for accuracy and completeness, and I agree with the above.

## 2020-06-27 ENCOUNTER — Other Ambulatory Visit: Payer: Self-pay

## 2020-06-27 ENCOUNTER — Ambulatory Visit: Payer: 59 | Admitting: Hematology and Oncology

## 2020-06-27 ENCOUNTER — Inpatient Hospital Stay: Payer: 59 | Attending: Hematology and Oncology | Admitting: Hematology and Oncology

## 2020-06-27 DIAGNOSIS — D696 Thrombocytopenia, unspecified: Secondary | ICD-10-CM | POA: Insufficient documentation

## 2020-06-27 DIAGNOSIS — Z886 Allergy status to analgesic agent status: Secondary | ICD-10-CM | POA: Insufficient documentation

## 2020-06-27 DIAGNOSIS — Z79899 Other long term (current) drug therapy: Secondary | ICD-10-CM | POA: Diagnosis not present

## 2020-06-27 DIAGNOSIS — R161 Splenomegaly, not elsewhere classified: Secondary | ICD-10-CM | POA: Diagnosis not present

## 2020-06-27 DIAGNOSIS — K7460 Unspecified cirrhosis of liver: Secondary | ICD-10-CM | POA: Insufficient documentation

## 2020-06-27 NOTE — Assessment & Plan Note (Signed)
Originally started August 2015: Platelet count 130 01/03/2015: Hemoglobin 6.4: Required blood transfusion and was found to be iron deficient 05/01/2017: Platelet count relatively stable 147 September 2019: Platelet count 110 03/21/2019: Platelet count 94 during hospitalization for cirrhosis and gastric variceal bleeding status post banding complicated by aspiration pneumonia 03/29/2019 at the time of discharge platelet count 104 06/25/2019: Platelet count 74 06/13/2020: Platelets 107  Differential diagnosis: Cirrhosis and splenomegaly related thrombocytopenia versus ITP Previous work-up has been negative hepatitis B, hepatitis C, HIV  Patient has chronic mild thrombocytopenia around 100-130 range probably due to cirrhosis.  Platelet counts are relatively stable. Continue watchful monitoring with a follow-up in 1 year.

## 2020-07-03 ENCOUNTER — Other Ambulatory Visit (HOSPITAL_COMMUNITY)
Admission: RE | Admit: 2020-07-03 | Discharge: 2020-07-03 | Disposition: A | Discharge status: Home / Self Care | Payer: 59 | Source: Ambulatory Visit | Attending: Surgery | Admitting: Surgery

## 2020-07-03 DIAGNOSIS — Z01812 Encounter for preprocedural laboratory examination: Secondary | ICD-10-CM | POA: Diagnosis not present

## 2020-07-03 DIAGNOSIS — Z20822 Contact with and (suspected) exposure to covid-19: Secondary | ICD-10-CM | POA: Insufficient documentation

## 2020-07-03 LAB — SARS CORONAVIRUS 2 (TAT 6-24 HRS): SARS Coronavirus 2: NEGATIVE

## 2020-07-04 ENCOUNTER — Encounter (HOSPITAL_COMMUNITY): Payer: Self-pay | Admitting: Surgery

## 2020-07-05 ENCOUNTER — Encounter (HOSPITAL_COMMUNITY): Payer: Self-pay | Admitting: Surgery

## 2020-07-05 NOTE — H&P (Signed)
Angela Castillo Appointment: 06/20/2020 4:20 PM Location: [facility] Patient #: [id number] DOB: 11/20/1975 Married / Language: [name] / Race: White Female   History of Present Illness (Angela Hernandez A. Ninfa Linden MD; 06/20/2020 4:45 PM) The patient is a 44 year old female who presents with an abdominal wall hernia.  Chief complaint: Ventral hernia  This is a 44 year old female with cirrhosis from a fatty liver who is here for evaluation of a ventral hernia. She is referred by Dr. Michail Sermon who is her gastroenterologist. Currently, normal liver standpoint, she is in a stable state with a meld score of 6. She did have banding of esophageal varices in February. She reports no ascites. She has no issues of bleeding currently. She is otherwise without complaints.   Past Surgical History (Angela Castillo; 06/20/2020 4:22 PM) No pertinent past surgical history   Diagnostic Studies History (Angela Castillo, Angela Castillo; 06/20/2020 4:22 PM) Colonoscopy  never Mammogram  within last year Pap Smear  1-5 years ago  Allergies Angela Castillo, Angela Castillo; 06/20/2020 4:23 PM) NSAIDs  Allergies Reconciled   Medication History (Angela Castillo, Angela Castillo; 06/20/2020 4:24 PM) amLODIPine Besylate (5MG Tablet, Oral) Active. Pantoprazole Sodium (40MG Tablet DR, Oral) Active. Propranolol HCl (20MG Tablet, Oral) Active. Amitriptyline HCl (25MG Tablet, Oral) Active. FeroSul (325 (65 Fe)MG Tablet, Oral) Active. Losartan Potassium-HCTZ (100-25MG Tablet, Oral) Active. metFORMIN HCl (500MG Tablet, Oral) Active. Medications Reconciled  Pregnancy / Birth History Angela Castillo, Angela Castillo; 06/20/2020 4:22 PM) Age at menarche  19 years. Gravida  2 Maternal age  2-20 Para  2 Regular periods   Other Problems (Angela Castillo, Angela Castillo; 06/20/2020 4:22 PM) Arthritis  Cirrhosis Of Liver  Diabetes Mellitus  Gastroesophageal Reflux Disease  High blood pressure  Other disease, cancer, significant illness   Transfusion history     Review of Systems (Angela Castillo; 06/20/2020 4:22 PM) General Present- Weight Gain. Not Present- Appetite Loss, Chills, Fatigue, Fever, Night Sweats and Weight Loss. Skin Not Present- Change in Wart/Mole, Dryness, Hives, Jaundice, New Lesions, Non-Healing Wounds, Rash and Ulcer. HEENT Present- Hearing Loss and Wears glasses/contact lenses. Not Present- Earache, Hoarseness, Nose Bleed, Oral Ulcers, Ringing in the Ears, Seasonal Allergies, Sinus Pain, Sore Throat, Visual Disturbances and Yellow Eyes. Breast Not Present- Breast Mass, Breast Pain, Nipple Discharge and Skin Changes. Cardiovascular Not Present- Chest Pain, Difficulty Breathing Lying Down, Leg Cramps, Palpitations, Rapid Heart Rate, Shortness of Breath and Swelling of Extremities. Gastrointestinal Not Present- Abdominal Pain, Bloating, Bloody Stool, Change in Bowel Habits, Chronic diarrhea, Constipation, Difficulty Swallowing, Excessive gas, Gets full quickly at meals, Hemorrhoids, Indigestion, Nausea, Rectal Pain and Vomiting. Female Genitourinary Present- Frequency. Not Present- Nocturia, Painful Urination, Pelvic Pain and Urgency. Neurological Present- Tingling. Not Present- Decreased Memory, Fainting, Headaches, Numbness, Seizures, Tremor, Trouble walking and Weakness. Psychiatric Not Present- Anxiety, Bipolar, Change in Sleep Pattern, Depression, Fearful and Frequent crying. Endocrine Present- Hot flashes. Not Present- Cold Intolerance, Excessive Hunger, Hair Changes, Heat Intolerance and New Diabetes. Hematology Not Present- Blood Thinners, Easy Bruising, Excessive bleeding, Gland problems, HIV and Persistent Infections.  Vitals (Angela Castillo; 06/20/2020 4:24 PM) 06/20/2020 4:24 PM Weight: 223.25 lb Height: 65in Body Surface Area: 2.07 m Body Mass Index: 37.15 kg/m  Temp.: 97.14F  Pulse: 74 (Regular)  BP: 124/76(Sitting, Left Arm, Standard)       Physical Exam (Angela Debose A.  Ninfa Linden MD; 06/20/2020 4:47 PM) The physical exam findings are as follows: Note: She appears comfortable on exam.  Her abdomen is soft and obese. There is a chronically  incarcerated hernia below the umbilicus which is nontender. There is no obvious ascites or varices  I reviewed the CAT scan of her abdomen and pelvis from May of this year. There was no ascites at that time. She has a hernia containing omentum. The fascial defect is moderate in size.  Her most recent platelet count was about 100,000. Her albumin was 1.8. Her liver function tests were normal    Assessment & Plan (Angela Logsdon A. Ninfa Linden MD; 06/20/2020 4:51 PM) VENTRAL HERNIA (K43.9) Impression: This patient with a ventral hernia containing incarcerated omentum and a history of cirrhosis. I reviewed all her notes from her gastroenterologist as well as nodes in the electronic medical records and Epic. I reviewed her CT scan. We discussed the diagnosis in detail. We discussed abdominal wall anatomy and hernias. We discussed the reasons for repairing hernias. Her issue remains or cirrhosis. Currently, she is calm and sedated and is a surgical candidate for repair with mesh. This may be able to be a conscious an outpatient. I discussed hernia repair with mesh. We discussed the risks which includes but is not limited to bleeding, infection, decompensation of her liver, use of mesh, hernia recurrence, postoperative recovery, cardiopulmonary issues, etc. She understands and wishes to proceed with surgery. As she is compensated currently, believe she is a reasonable candidate at this time.

## 2020-07-05 NOTE — Anesthesia Preprocedure Evaluation (Addendum)
Anesthesia Evaluation  Patient identified by MRN, date of birth, ID band Patient awake    Reviewed: Allergy & Precautions, NPO status , Patient's Chart, lab work & pertinent test results, reviewed documented beta blocker date and time   History of Anesthesia Complications Negative for: history of anesthetic complications  Airway Mallampati: III  TM Distance: >3 FB Neck ROM: Full    Dental  (+) Dental Advisory Given   Pulmonary neg pulmonary ROS,  12/17/2019 SARS coronavirus NEG   breath sounds clear to auscultation       Cardiovascular hypertension, Pt. on medications and Pt. on home beta blockers (-) angina Rhythm:Regular Rate:Normal     Neuro/Psych negative neurological ROS  negative psych ROS   GI/Hepatic GERD  Controlled and Medicated,(+) Cirrhosis  ([facility])  Esophageal Varices and ascites    , Hepatitis -  Endo/Other  diabetes, Oral Hypoglycemic AgentsMorbid obesity  Renal/GU      Musculoskeletal   Abdominal (+) + obese,   Peds  Hematology  (+) anemia , thrombocytopenia   Anesthesia Other Findings   Reproductive/Obstetrics S/p BTL                            Anesthesia Physical  Anesthesia Plan  ASA: III  Anesthesia Plan: General   Post-op Pain Management:    Induction:   PONV Risk Score and Plan: 3 and Treatment may vary due to age or medical condition, Ondansetron, Scopolamine patch - Pre-op, Midazolam and Dexamethasone  Airway Management Planned: Oral ETT  Additional Equipment:   Intra-op Plan:   Post-operative Plan:   Informed Consent: I have reviewed the patients History and Physical, chart, labs and discussed the procedure including the risks, benefits and alternatives for the proposed anesthesia with the patient or authorized representative who has indicated his/her understanding and acceptance.       Plan Discussed with: CRNA, Surgeon and  Anesthesiologist  Anesthesia Plan Comments:         Anesthesia Quick Evaluation

## 2020-07-06 ENCOUNTER — Ambulatory Visit (HOSPITAL_COMMUNITY): Payer: 59 | Admitting: Anesthesiology

## 2020-07-06 ENCOUNTER — Encounter (HOSPITAL_COMMUNITY)
Admission: RE | Disposition: A | Discharge status: Home / Self Care | Payer: Self-pay | Source: Home / Self Care | Attending: Surgery

## 2020-07-06 ENCOUNTER — Ambulatory Visit (HOSPITAL_COMMUNITY)
Admission: RE | Admit: 2020-07-06 | Discharge: 2020-07-06 | Disposition: A | Discharge status: Home / Self Care | Payer: 59 | Attending: Surgery | Admitting: Surgery

## 2020-07-06 ENCOUNTER — Other Ambulatory Visit: Payer: Self-pay

## 2020-07-06 ENCOUNTER — Encounter (HOSPITAL_COMMUNITY): Payer: Self-pay | Admitting: Surgery

## 2020-07-06 DIAGNOSIS — E119 Type 2 diabetes mellitus without complications: Secondary | ICD-10-CM | POA: Diagnosis not present

## 2020-07-06 DIAGNOSIS — K760 Fatty (change of) liver, not elsewhere classified: Secondary | ICD-10-CM | POA: Diagnosis not present

## 2020-07-06 DIAGNOSIS — K219 Gastro-esophageal reflux disease without esophagitis: Secondary | ICD-10-CM | POA: Insufficient documentation

## 2020-07-06 DIAGNOSIS — Z886 Allergy status to analgesic agent status: Secondary | ICD-10-CM | POA: Diagnosis not present

## 2020-07-06 DIAGNOSIS — K436 Other and unspecified ventral hernia with obstruction, without gangrene: Secondary | ICD-10-CM | POA: Insufficient documentation

## 2020-07-06 DIAGNOSIS — R030 Elevated blood-pressure reading, without diagnosis of hypertension: Secondary | ICD-10-CM | POA: Diagnosis not present

## 2020-07-06 DIAGNOSIS — I8510 Secondary esophageal varices without bleeding: Secondary | ICD-10-CM | POA: Diagnosis not present

## 2020-07-06 DIAGNOSIS — M1990 Unspecified osteoarthritis, unspecified site: Secondary | ICD-10-CM | POA: Diagnosis not present

## 2020-07-06 DIAGNOSIS — Z859 Personal history of malignant neoplasm, unspecified: Secondary | ICD-10-CM | POA: Insufficient documentation

## 2020-07-06 DIAGNOSIS — K7460 Unspecified cirrhosis of liver: Secondary | ICD-10-CM | POA: Diagnosis not present

## 2020-07-06 HISTORY — PX: VENTRAL HERNIA REPAIR: SHX424

## 2020-07-06 LAB — CBC WITH DIFFERENTIAL/PLATELET
Abs Immature Granulocytes: 0.03 10*3/uL (ref 0.00–0.07)
Basophils Absolute: 0 10*3/uL (ref 0.0–0.1)
Basophils Relative: 0 %
Eosinophils Absolute: 0.1 10*3/uL (ref 0.0–0.5)
Eosinophils Relative: 2 %
HCT: 38.7 % (ref 36.0–46.0)
Hemoglobin: 12 g/dL (ref 12.0–15.0)
Immature Granulocytes: 0 %
Lymphocytes Relative: 25 %
Lymphs Abs: 2 10*3/uL (ref 0.7–4.0)
MCH: 27.5 pg (ref 26.0–34.0)
MCHC: 31 g/dL (ref 30.0–36.0)
MCV: 88.6 fL (ref 80.0–100.0)
Monocytes Absolute: 0.4 10*3/uL (ref 0.1–1.0)
Monocytes Relative: 5 %
Neutro Abs: 5.3 10*3/uL (ref 1.7–7.7)
Neutrophils Relative %: 68 %
Platelets: 153 10*3/uL (ref 150–400)
RBC: 4.37 MIL/uL (ref 3.87–5.11)
RDW: 14.2 % (ref 11.5–15.5)
WBC: 7.8 10*3/uL (ref 4.0–10.5)
nRBC: 0 % (ref 0.0–0.2)

## 2020-07-06 LAB — COMPREHENSIVE METABOLIC PANEL
ALT: 25 U/L (ref 0–44)
AST: 21 U/L (ref 15–41)
Albumin: 3.5 g/dL (ref 3.5–5.0)
Alkaline Phosphatase: 72 U/L (ref 38–126)
Anion gap: 11 (ref 5–15)
BUN: 10 mg/dL (ref 6–20)
CO2: 25 mmol/L (ref 22–32)
Calcium: 9.1 mg/dL (ref 8.9–10.3)
Chloride: 101 mmol/L (ref 98–111)
Creatinine, Ser: 0.62 mg/dL (ref 0.44–1.00)
GFR calc Af Amer: >60 mL/min (ref >60)
GFR calc non Af Amer: >60 mL/min (ref >60)
Glucose, Bld: 94 mg/dL (ref 70–99)
Potassium: 3.6 mmol/L (ref 3.5–5.1)
Sodium: 137 mmol/L (ref 135–145)
Total Bilirubin: 0.6 mg/dL (ref 0.3–1.2)
Total Protein: 8.2 g/dL — ABNORMAL HIGH (ref 6.5–8.1)

## 2020-07-06 LAB — POCT PREGNANCY, URINE: Preg Test, Ur: NEGATIVE

## 2020-07-06 LAB — APTT: aPTT: 29 seconds (ref 24–36)

## 2020-07-06 LAB — PROTIME-INR
INR: 1.1 (ref 0.8–1.2)
Prothrombin Time: 13.8 seconds (ref 11.4–15.2)

## 2020-07-06 LAB — GLUCOSE, CAPILLARY: Glucose-Capillary: 100 mg/dL — ABNORMAL HIGH (ref 70–99)

## 2020-07-06 SURGERY — REPAIR, HERNIA, VENTRAL
Anesthesia: General

## 2020-07-06 MED ORDER — SCOPOLAMINE 1 MG/3DAYS TD PT72: 1.0000 | MEDICATED_PATCH | TRANSDERMAL | Status: DC

## 2020-07-06 MED ORDER — MIDAZOLAM HCL 2 MG/2ML IJ SOLN
INTRAMUSCULAR | Status: AC
  Filled 2020-07-06: qty 2

## 2020-07-06 MED ORDER — DIPHENHYDRAMINE HCL 50 MG/ML IJ SOLN
INTRAMUSCULAR | Status: DC | PRN
  Administered 2020-07-06: 12.5 mg via INTRAVENOUS

## 2020-07-06 MED ORDER — LIDOCAINE 2% (20 MG/ML) 5 ML SYRINGE
INTRAMUSCULAR | Status: DC | PRN
  Administered 2020-07-06: 100 mg via INTRAVENOUS

## 2020-07-06 MED ORDER — CHLORHEXIDINE GLUCONATE CLOTH 2 % EX PADS: 6.0000 | MEDICATED_PAD | Freq: Once | CUTANEOUS | Status: DC

## 2020-07-06 MED ORDER — BUPIVACAINE HCL (PF) 0.25 % IJ SOLN
INTRAMUSCULAR | Status: DC | PRN
  Administered 2020-07-06: 30 mL

## 2020-07-06 MED ORDER — SCOPOLAMINE 1 MG/3DAYS TD PT72
MEDICATED_PATCH | TRANSDERMAL | Status: AC
  Administered 2020-07-06: 1.5 mg via TRANSDERMAL
  Filled 2020-07-06: qty 1

## 2020-07-06 MED ORDER — FENTANYL CITRATE (PF) 250 MCG/5ML IJ SOLN
INTRAMUSCULAR | Status: AC
Start: 2020-07-06 — End: ?
  Filled 2020-07-06: qty 5

## 2020-07-06 MED ORDER — PROPOFOL 10 MG/ML IV BOLUS
INTRAVENOUS | Status: DC | PRN
  Administered 2020-07-06: 160 mg via INTRAVENOUS

## 2020-07-06 MED ORDER — PROMETHAZINE HCL 25 MG/ML IJ SOLN: 6.2500 mg | INTRAMUSCULAR | Status: DC | PRN

## 2020-07-06 MED ORDER — CHLORHEXIDINE GLUCONATE 0.12 % MT SOLN: 15.0000 mL | Freq: Once | OROMUCOSAL | Status: AC

## 2020-07-06 MED ORDER — ONDANSETRON HCL 4 MG/2ML IJ SOLN
INTRAMUSCULAR | Status: DC | PRN
  Administered 2020-07-06: 4 mg via INTRAVENOUS

## 2020-07-06 MED ORDER — MIDAZOLAM HCL 5 MG/5ML IJ SOLN
INTRAMUSCULAR | Status: DC | PRN
  Administered 2020-07-06: 2 mg via INTRAVENOUS

## 2020-07-06 MED ORDER — HYDROMORPHONE HCL 1 MG/ML IJ SOLN
0.2500 mg | INTRAMUSCULAR | Status: DC | PRN
  Administered 2020-07-06: 0.5 mg via INTRAVENOUS

## 2020-07-06 MED ORDER — OXYCODONE HCL 5 MG PO TABS: 5.0000 mg | ORAL_TABLET | Freq: Four times a day (QID) | ORAL | 0 refills | Status: DC | PRN

## 2020-07-06 MED ORDER — DIPHENHYDRAMINE HCL 50 MG/ML IJ SOLN
INTRAMUSCULAR | Status: AC
  Filled 2020-07-06: qty 1

## 2020-07-06 MED ORDER — STERILE WATER FOR IRRIGATION IR SOLN
Status: DC | PRN
  Administered 2020-07-06: 1000 mL

## 2020-07-06 MED ORDER — OXYCODONE HCL 5 MG PO TABS
5.0000 mg | ORAL_TABLET | Freq: Once | ORAL | Status: AC
  Administered 2020-07-06: 5 mg via ORAL

## 2020-07-06 MED ORDER — ROCURONIUM BROMIDE 10 MG/ML (PF) SYRINGE
PREFILLED_SYRINGE | INTRAVENOUS | Status: DC | PRN
  Administered 2020-07-06: 80 mg via INTRAVENOUS

## 2020-07-06 MED ORDER — ENSURE PRE-SURGERY PO LIQD: 296.0000 mL | Freq: Once | ORAL | Status: DC

## 2020-07-06 MED ORDER — HYDROMORPHONE HCL 1 MG/ML IJ SOLN
INTRAMUSCULAR | Status: AC
  Filled 2020-07-06: qty 1

## 2020-07-06 MED ORDER — ROCURONIUM BROMIDE 10 MG/ML (PF) SYRINGE
PREFILLED_SYRINGE | INTRAVENOUS | Status: AC
  Filled 2020-07-06: qty 10

## 2020-07-06 MED ORDER — BUPIVACAINE HCL (PF) 0.25 % IJ SOLN
INTRAMUSCULAR | Status: AC
  Filled 2020-07-06: qty 30

## 2020-07-06 MED ORDER — PROPOFOL 10 MG/ML IV BOLUS
INTRAVENOUS | Status: AC
  Filled 2020-07-06: qty 40

## 2020-07-06 MED ORDER — PHENYLEPHRINE 40 MCG/ML (10ML) SYRINGE FOR IV PUSH (FOR BLOOD PRESSURE SUPPORT)
PREFILLED_SYRINGE | INTRAVENOUS | Status: DC | PRN
  Administered 2020-07-06: 120 ug via INTRAVENOUS
  Administered 2020-07-06 (×2): 80 ug via INTRAVENOUS

## 2020-07-06 MED ORDER — OXYCODONE HCL 5 MG PO TABS
ORAL_TABLET | ORAL | Status: AC
  Filled 2020-07-06: qty 1

## 2020-07-06 MED ORDER — FENTANYL CITRATE (PF) 250 MCG/5ML IJ SOLN
INTRAMUSCULAR | Status: DC | PRN
  Administered 2020-07-06: 100 ug via INTRAVENOUS
  Administered 2020-07-06: 50 ug via INTRAVENOUS
  Administered 2020-07-06: 100 ug via INTRAVENOUS

## 2020-07-06 MED ORDER — ORAL CARE MOUTH RINSE: 15.0000 mL | Freq: Once | OROMUCOSAL | Status: AC

## 2020-07-06 MED ORDER — LACTATED RINGERS IV SOLN: INTRAVENOUS | Status: DC

## 2020-07-06 MED ORDER — CEFAZOLIN SODIUM-DEXTROSE 2-4 GM/100ML-% IV SOLN
INTRAVENOUS | Status: AC
  Filled 2020-07-06: qty 100

## 2020-07-06 MED ORDER — DEXAMETHASONE SODIUM PHOSPHATE 10 MG/ML IJ SOLN
INTRAMUSCULAR | Status: DC | PRN
  Administered 2020-07-06: 4 mg via INTRAVENOUS

## 2020-07-06 MED ORDER — AMISULPRIDE (ANTIEMETIC) 5 MG/2ML IV SOLN: 10.0000 mg | Freq: Once | INTRAVENOUS | Status: DC | PRN

## 2020-07-06 MED ORDER — SUGAMMADEX SODIUM 200 MG/2ML IV SOLN
INTRAVENOUS | Status: DC | PRN
  Administered 2020-07-06: 210 mg via INTRAVENOUS

## 2020-07-06 MED ORDER — 0.9 % SODIUM CHLORIDE (POUR BTL) OPTIME
TOPICAL | Status: DC | PRN
  Administered 2020-07-06: 1000 mL

## 2020-07-06 MED ORDER — CEFAZOLIN SODIUM-DEXTROSE 2-4 GM/100ML-% IV SOLN
2.0000 g | INTRAVENOUS | Status: AC
  Administered 2020-07-06: 2 g via INTRAVENOUS

## 2020-07-06 MED ORDER — CHLORHEXIDINE GLUCONATE 0.12 % MT SOLN
OROMUCOSAL | Status: AC
  Administered 2020-07-06: 15 mL via OROMUCOSAL
  Filled 2020-07-06: qty 15

## 2020-07-06 MED ORDER — HYDROMORPHONE HCL 1 MG/ML IJ SOLN
INTRAMUSCULAR | Status: DC | PRN
  Administered 2020-07-06: .5 mg via INTRAVENOUS

## 2020-07-06 MED ORDER — DEXAMETHASONE SODIUM PHOSPHATE 10 MG/ML IJ SOLN
INTRAMUSCULAR | Status: AC
  Filled 2020-07-06: qty 1

## 2020-07-06 MED ORDER — LIDOCAINE 2% (20 MG/ML) 5 ML SYRINGE
INTRAMUSCULAR | Status: AC
  Filled 2020-07-06: qty 5

## 2020-07-06 MED ORDER — HYDROMORPHONE HCL 1 MG/ML IJ SOLN
INTRAMUSCULAR | Status: AC
  Filled 2020-07-06: qty 0.5

## 2020-07-06 MED ORDER — ONDANSETRON HCL 4 MG/2ML IJ SOLN
INTRAMUSCULAR | Status: AC
  Filled 2020-07-06: qty 2

## 2020-07-06 MED ORDER — PHENYLEPHRINE 40 MCG/ML (10ML) SYRINGE FOR IV PUSH (FOR BLOOD PRESSURE SUPPORT)
PREFILLED_SYRINGE | INTRAVENOUS | Status: AC
  Filled 2020-07-06: qty 10

## 2020-07-06 SURGICAL SUPPLY — 34 items
CANISTER SUCT 3000ML PPV (MISCELLANEOUS) ×2 IMPLANT
CHLORAPREP W/TINT 26 (MISCELLANEOUS) ×2 IMPLANT
COVER SURGICAL LIGHT HANDLE (MISCELLANEOUS) ×2 IMPLANT
COVER WAND RF STERILE (DRAPES) ×2 IMPLANT
DERMABOND ADHESIVE PROPEN (GAUZE/BANDAGES/DRESSINGS) ×1
DERMABOND ADVANCED .7 DNX6 (GAUZE/BANDAGES/DRESSINGS) ×1 IMPLANT
DRAPE LAPAROSCOPIC ABDOMINAL (DRAPES) ×2 IMPLANT
DRSG TEGADERM 4X4.75 (GAUZE/BANDAGES/DRESSINGS) ×2 IMPLANT
ELECT BLADE 4.0 EZ CLEAN MEGAD (MISCELLANEOUS) ×2
ELECT REM PT RETURN 9FT ADLT (ELECTROSURGICAL) ×2
ELECTRODE BLDE 4.0 EZ CLN MEGD (MISCELLANEOUS) ×1 IMPLANT
ELECTRODE REM PT RTRN 9FT ADLT (ELECTROSURGICAL) ×1 IMPLANT
GLOVE SURG SIGNA 7.5 PF LTX (GLOVE) ×12 IMPLANT
GOWN STRL REUS W/ TWL LRG LVL3 (GOWN DISPOSABLE) ×2 IMPLANT
GOWN STRL REUS W/ TWL XL LVL3 (GOWN DISPOSABLE) ×1 IMPLANT
GOWN STRL REUS W/TWL LRG LVL3 (GOWN DISPOSABLE) ×4
GOWN STRL REUS W/TWL XL LVL3 (GOWN DISPOSABLE) ×2
KIT BASIN OR (CUSTOM PROCEDURE TRAY) ×2 IMPLANT
KIT TURNOVER KIT B (KITS) ×2 IMPLANT
MESH VENTRALEX ST 2.5 CRC MED (Mesh General) ×2 IMPLANT
NEEDLE HYPO 25GX1X1/2 BEV (NEEDLE) ×2 IMPLANT
NS IRRIG 1000ML POUR BTL (IV SOLUTION) ×2 IMPLANT
PACK GENERAL/GYN (CUSTOM PROCEDURE TRAY) ×2 IMPLANT
PAD ARMBOARD 7.5X6 YLW CONV (MISCELLANEOUS) ×2 IMPLANT
PENCIL SMOKE EVACUATOR (MISCELLANEOUS) ×2 IMPLANT
SUT MNCRL AB 4-0 PS2 18 (SUTURE) ×2 IMPLANT
SUT NOVA NAB DX-16 0-1 5-0 T12 (SUTURE) ×4 IMPLANT
SUT SILK 2 0 (SUTURE) ×2
SUT SILK 2-0 18XBRD TIE 12 (SUTURE) ×1 IMPLANT
SUT VIC AB 2-0 UR6 27 (SUTURE) ×2 IMPLANT
SUT VICRYL AB 2 0 TIES (SUTURE) ×2 IMPLANT
SYR CONTROL 10ML LL (SYRINGE) ×2 IMPLANT
TOWEL GREEN STERILE (TOWEL DISPOSABLE) ×2 IMPLANT
TOWEL GREEN STERILE FF (TOWEL DISPOSABLE) ×2 IMPLANT

## 2020-07-06 NOTE — Interval H&P Note (Signed)
History and Physical Interval Note:no change in H and P  07/06/2020 7:05 AM  Angela Castillo  has presented today for surgery, with the diagnosis of VENTRAL HERNIA.  The various methods of treatment have been discussed with the patient and family. After consideration of risks, benefits and other options for treatment, the patient has consented to  Procedure(s): VENTRAL HERNIA REPAIR WITH MESH (N/A) as a surgical intervention.  The patient's history has been reviewed, patient examined, no change in status, stable for surgery.  I have reviewed the patient's chart and labs.  Questions were answered to the patient's satisfaction.     Coralie Keens

## 2020-07-06 NOTE — Op Note (Signed)
VENTRAL HERNIA REPAIR WITH MESH  Procedure Note  Angela Castillo 07/06/2020   Pre-op Diagnosis: VENTRAL HERNIA     Post-op Diagnosis: same  Procedure(s): VENTRAL HERNIA REPAIR WITH MESH (6.4 cm round ventralex)  Surgeon(s): Coralie Keens, MD  Daiva Huge, MD, Resident  Anesthesia: General  Staff:  Circulator: Sadie Haber, RN; Martinique, Karrie S, RN Scrub Person: Dollene Cleveland T  Estimated Blood Loss: Minimal               Findings: The patient had an incarcerated hernia below the umbilicus containing omentum.  The fascial defect was approximately 1.5 cm and was repaired with a 6.4 cm round ventral patch from Bard.  Procedure: The patient was brought to the operating room and identifies correct patient.  She is placed upon the operating table general anesthesia was induced.  Her abdomen was then prepped and draped in usual sterile fashion.  I made a midline incision just below the umbilicus over the palpable hernia.  I then dissected down to the subcutaneous tissue with the electrocautery.  The patient had a large amount of subcutaneous fat surrounding the hernia sac which I was able to excise and followed down to the fascia.  I then opened the hernia sac circumferentially on the fascial opening.  Several areas of omentum had to be tied off with 2-0 silk ties to control bleeding.  There was no bowel involved in the hernia and no evidence of ascites.  I excised all the redundant sac and overlying fatty tissue.  The fascial defect was approximate 1.5 cm in size.  A 6.4 cm round ventral Prolene patch from Bard was brought to the field.  It was placed through the fascia opening and then pulled up against the peritoneum with stay ties.  The mesh was then sutured in place with interrupted #1 Novafil suture circumferentially.  The ties were then cut and the fascia was closed over the top of the mesh with figure-of-eight #1 Novafil sutures.  Wide coverage of the fascial defect  and closure appeared to be achieved.  I anesthetized the fascia circumferentially with Marcaine.  The subcutaneous tissue was closed interrupted 3-0 Vicryl sutures and the skin was closed with running 4-0 Monocryl.  Dermabond was then applied.  The patient tolerated procedure well.  All the counts were correct at the end of the procedure.  The patient was then extubated in the operating room and taken in stable condition to the recovery room.            Coralie Keens   Date: 07/06/2020  Time: 8:27 AM

## 2020-07-06 NOTE — Anesthesia Procedure Notes (Signed)
Procedure Name: Intubation Date/Time: 07/06/2020 7:35 AM Performed by: Renato Shin, CRNA Pre-anesthesia Checklist: Patient identified, Emergency Drugs available, Suction available and Patient being monitored Patient Re-evaluated:Patient Re-evaluated prior to induction Oxygen Delivery Method: Circle system utilized Preoxygenation: Pre-oxygenation with 100% oxygen Induction Type: IV induction Ventilation: Mask ventilation without difficulty Laryngoscope Size: Miller and 2 Grade View: Grade I Tube type: Oral Tube size: 7.5 mm Number of attempts: 1 Airway Equipment and Method: Stylet and Oral airway Placement Confirmation: ETT inserted through vocal cords under direct vision,  positive ETCO2 and breath sounds checked- equal and bilateral Secured at: 21 cm Tube secured with: Tape Dental Injury: Teeth and Oropharynx as per pre-operative assessment

## 2020-07-06 NOTE — Transfer of Care (Signed)
Immediate Anesthesia Transfer of Care Note  Patient: Angela Castillo  Procedure(s) Performed: VENTRAL HERNIA REPAIR WITH MESH (N/A )  Patient Location: PACU  Anesthesia Type:General  Level of Consciousness: drowsy and patient cooperative  Airway & Oxygen Therapy: Patient Spontanous Breathing and Patient connected to nasal cannula oxygen  Post-op Assessment: Report given to RN and Post -op Vital signs reviewed and stable  Post vital signs: Reviewed and stable  Last Vitals:  Vitals Value Taken Time  BP 122/94 07/06/20 0836  Temp    Pulse 79 07/06/20 0838  Resp 13 07/06/20 0838  SpO2 100 % 07/06/20 0838  Vitals shown include unvalidated device data.  Last Pain:  Vitals:   07/06/20 0628  TempSrc:   PainSc: 0-No pain      Patients Stated Pain Goal: 3 (68/93/40 6840)  Complications: No complications documented.

## 2020-07-06 NOTE — Anesthesia Postprocedure Evaluation (Signed)
Anesthesia Post Note  Patient: Angela Castillo  Procedure(s) Performed: VENTRAL HERNIA REPAIR WITH MESH (N/A )     Patient location during evaluation: PACU Anesthesia Type: General Level of consciousness: awake Pain management: pain level controlled Vital Signs Assessment: post-procedure vital signs reviewed and stable Respiratory status: spontaneous breathing and respiratory function stable Cardiovascular status: stable Postop Assessment: no apparent nausea or vomiting Anesthetic complications: no   No complications documented.  Last Vitals:  Vitals:   07/06/20 0850 07/06/20 0900  BP: 126/84 127/85  Pulse: 86 72  Resp: 17 11  Temp:  36.4 C  SpO2: 98% 100%    Last Pain:  Vitals:   07/06/20 0854  TempSrc:   PainSc: 0-No pain                 Merlinda Frederick

## 2020-07-06 NOTE — Discharge Instructions (Signed)
[facility]  UMBILICAL OR INGUINAL HERNIA REPAIR: POST OP INSTRUCTIONS  Always review your discharge instruction sheet given to you by the facility where your surgery was performed. IF YOU HAVE DISABILITY OR FAMILY LEAVE FORMS, YOU MUST BRING THEM TO THE OFFICE FOR PROCESSING.   DO NOT GIVE THEM TO YOUR DOCTOR.  1. A  prescription for pain medication may be given to you upon discharge.  Take your pain medication as prescribed, if needed.  If narcotic pain medicine is not needed, then you may take acetaminophen (Tylenol) or ibuprofen (Advil) as needed. 2. Take your usually prescribed medications unless otherwise directed. If you need a refill on your pain medication, please contact your pharmacy.  They will contact our office to request authorization. Prescriptions will not be filled after 5 pm or on week-ends. 3. You should follow a light diet the first 24 hours after arrival home, such as soup and crackers, etc.  Be sure to include lots of fluids daily.  Resume your normal diet the day after surgery. 4.Most patients will experience some swelling and bruising around the umbilicus or in the groin and scrotum.  Ice packs and reclining will help.  Swelling and bruising can take several days to resolve.  6. It is common to experience some constipation if taking pain medication after surgery.  Increasing fluid intake and taking a stool softener (such as Colace) will usually help or prevent this problem from occurring.  A mild laxative (Milk of Magnesia or Miralax) should be taken according to package directions if there are no bowel movements after 48 hours. 7. Unless discharge instructions indicate otherwise, you may remove your bandages 24-48 hours after surgery, and you may shower at that time.  You may have steri-strips (small skin tapes) in place directly over the incision.  These strips should be left on the skin for 7-10 days.  If your surgeon used skin glue on the  incision, you may shower in 24 hours.  The glue will flake off over the next 2-3 weeks.  Any sutures or staples will be removed at the office during your follow-up visit. 8. ACTIVITIES:  You may resume regular (light) daily activities beginning the next day--such as daily self-care, walking, climbing stairs--gradually increasing activities as tolerated.  You may have sexual intercourse when it is comfortable.  Refrain from any heavy lifting or straining until approved by your doctor.  a.You may drive when you are no longer taking prescription pain medication, you can comfortably wear a seatbelt, and you can safely maneuver your car and apply brakes. b.RETURN TO WORK:   _____________________________________________  9.You should see your doctor in the office for a follow-up appointment approximately 2-3 weeks after your surgery.  Make sure that you call for this appointment within a day or two after you arrive home to insure a convenient appointment time. 10.OTHER INSTRUCTIONS: _OK TO SHOWER STARTING TOMORROW ICE PACK, TYLENOL ALSO FOR PAIN NO LIFTING MORE THAN 15 POUNDS FOR 4 WEEKS________________________    _____________________________________  WHEN TO CALL YOUR DOCTOR: 1. Fever over 101.0 2. Inability to urinate 3. Nausea and/or vomiting 4. Extreme swelling or bruising 5. Continued bleeding from incision. 6. Increased pain, redness, or drainage from the incision  The clinic staff is available to answer your questions during regular business hours.  Please don't hesitate to call and ask to speak to one of the nurses for clinical concerns.  If you have a medical emergency, go to the nearest emergency room  or call 911.  A surgeon from [facility] is always on call at the hospital   [address] [place], [place], [place]  [address] ?  [address], [place], [place]   [phone] ? [phone] ? FAX [phone] Web site: [email or web address]

## 2020-07-07 ENCOUNTER — Encounter (HOSPITAL_COMMUNITY): Payer: Self-pay | Admitting: Surgery

## 2020-07-17 ENCOUNTER — Ambulatory Visit: Payer: 59 | Admitting: Orthopaedic Surgery

## 2020-07-31 ENCOUNTER — Other Ambulatory Visit: Payer: Self-pay | Admitting: Nurse Practitioner

## 2020-07-31 DIAGNOSIS — H6121 Impacted cerumen, right ear: Secondary | ICD-10-CM

## 2020-07-31 DIAGNOSIS — R519 Headache, unspecified: Secondary | ICD-10-CM

## 2020-08-03 ENCOUNTER — Other Ambulatory Visit: Payer: Self-pay

## 2020-08-03 ENCOUNTER — Ambulatory Visit
Admission: RE | Admit: 2020-08-03 | Discharge: 2020-08-03 | Disposition: A | Discharge status: Home / Self Care | Payer: 59 | Source: Ambulatory Visit | Attending: Nurse Practitioner | Admitting: Nurse Practitioner

## 2020-08-03 DIAGNOSIS — Z1231 Encounter for screening mammogram for malignant neoplasm of breast: Secondary | ICD-10-CM

## 2020-08-03 IMAGING — MG DIGITAL SCREENING BILAT W/ TOMO W/ CAD
8 series · 8 of 24 positions shown · non-contrast
Comparison: Previous exam(s).

CLINICAL DATA: Screening.

EXAM:
DIGITAL SCREENING BILATERAL MAMMOGRAM WITH TOMO AND CAD

[L MLO synth-2D]
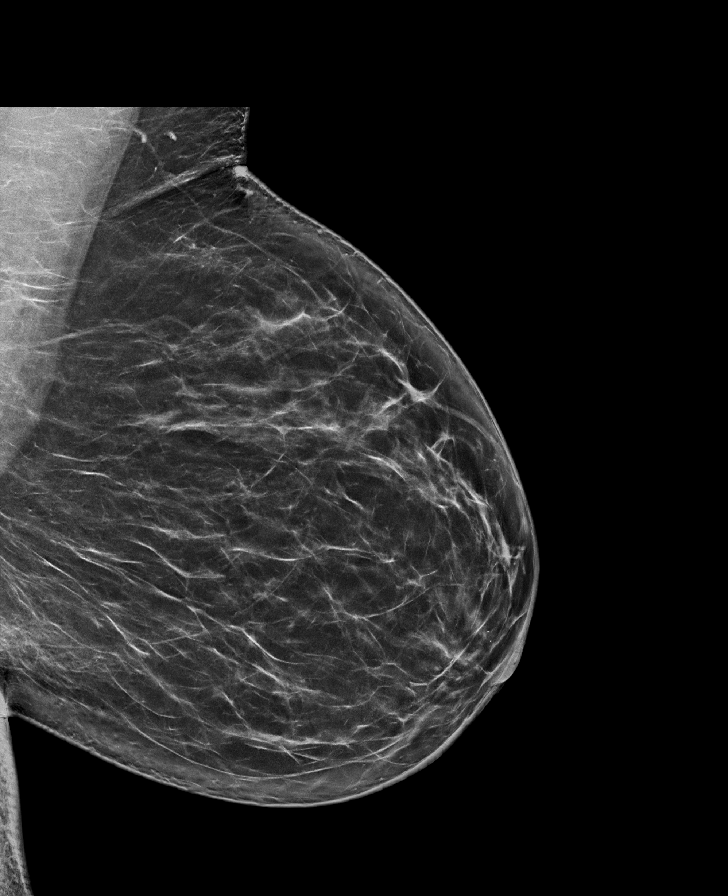

[R MLO synth-2D]
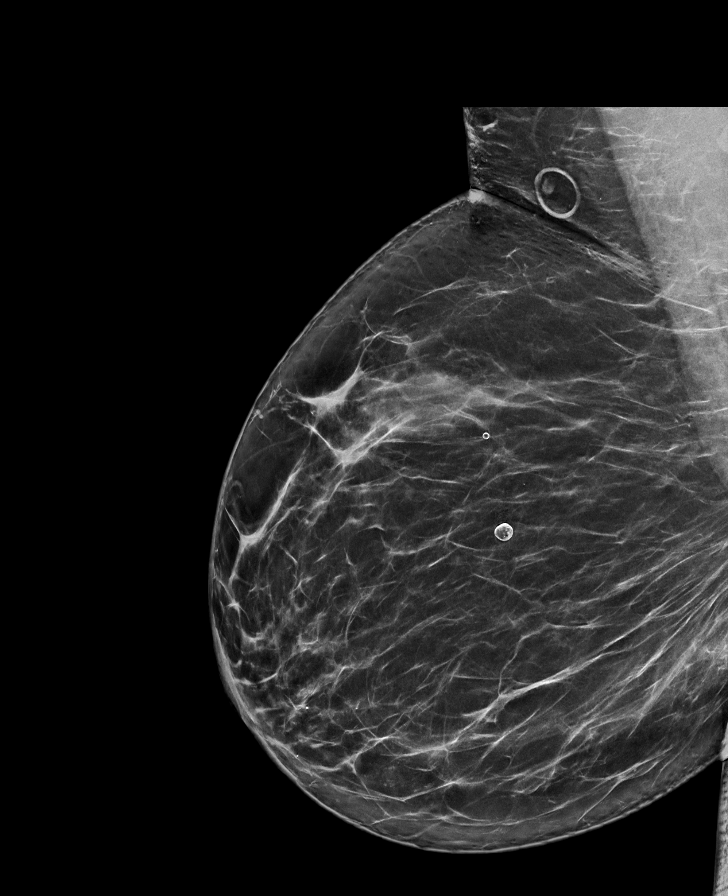

[L CC synth-2D]
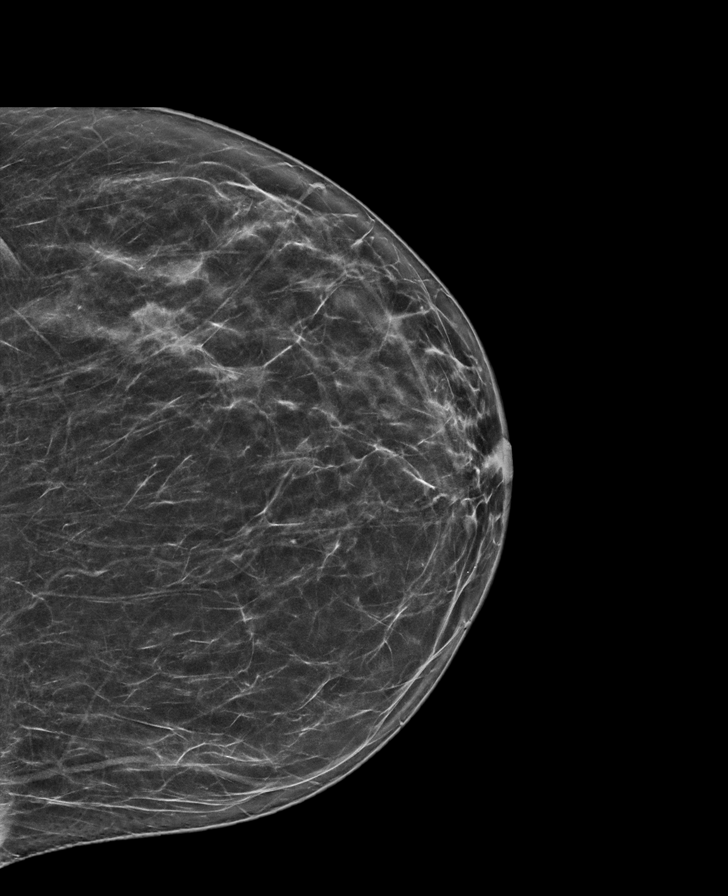

[R CC synth-2D]
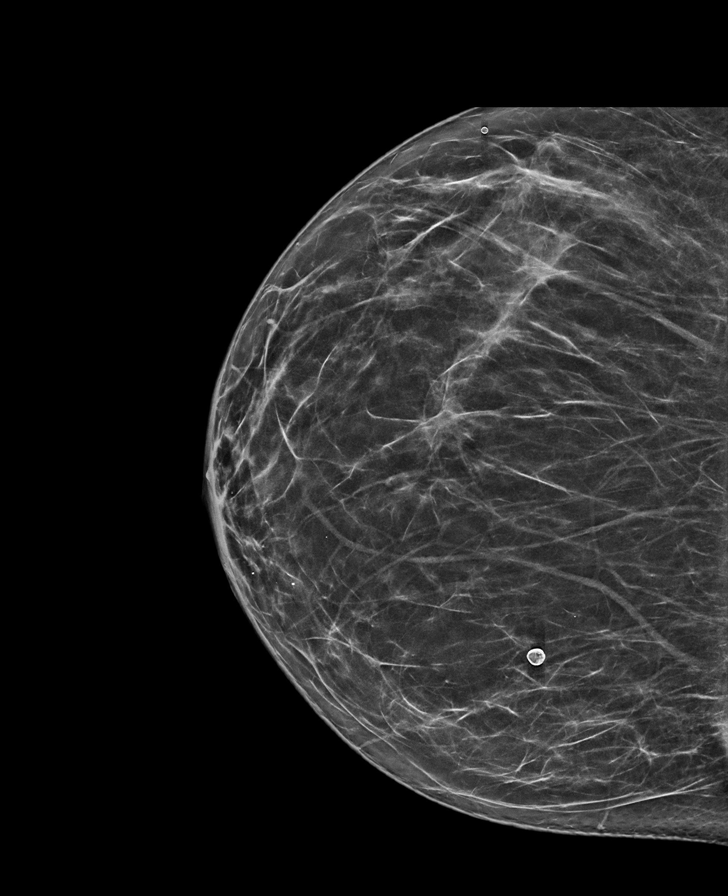

[R CC tomo · tomo slice 35/70.0]
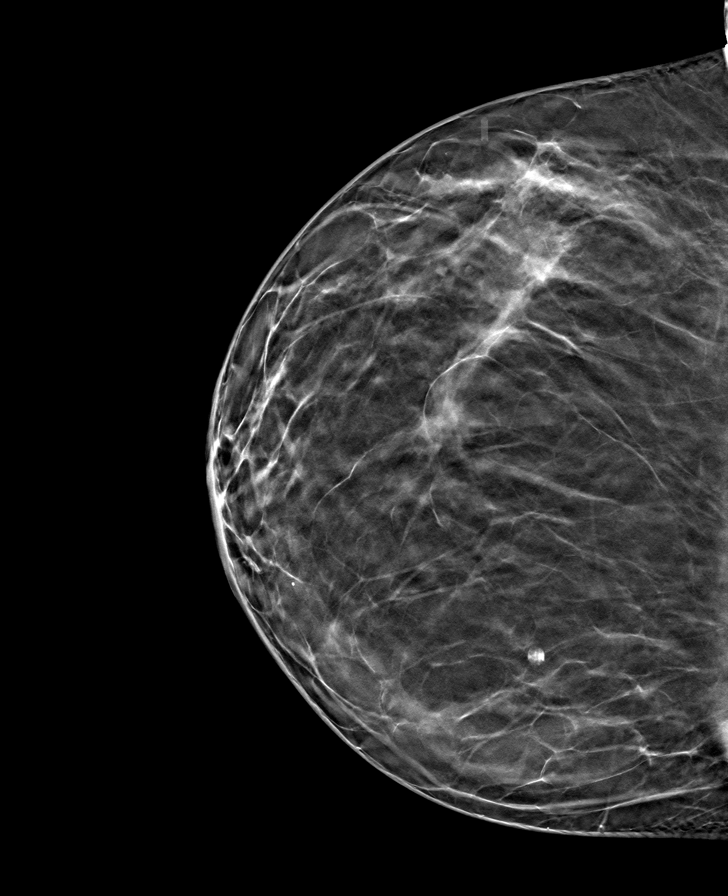

[R MLO tomo · tomo slice 45/89.0]
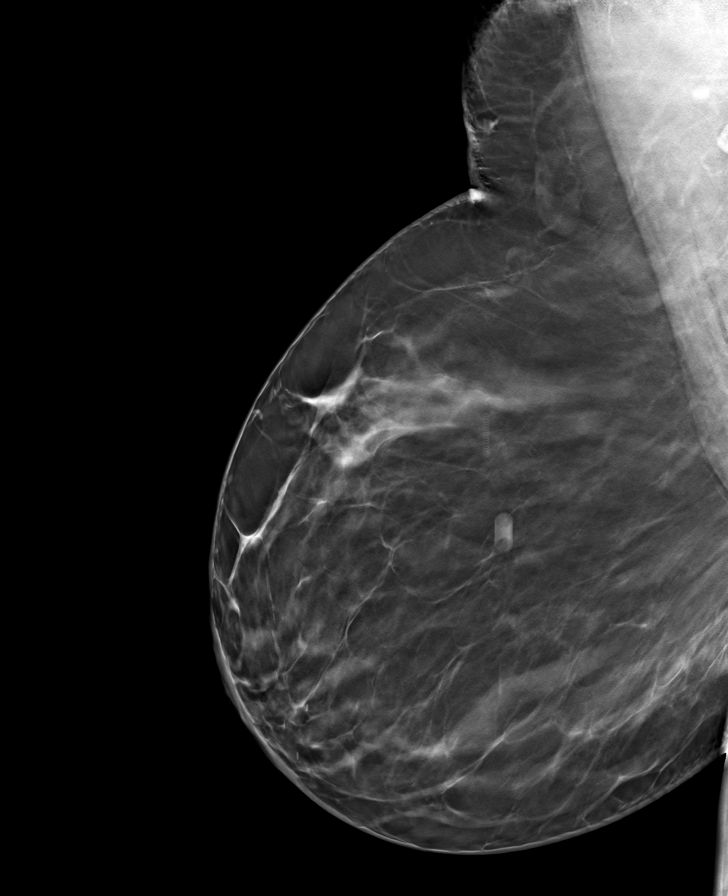

[L CC tomo · tomo slice 33/66.0]
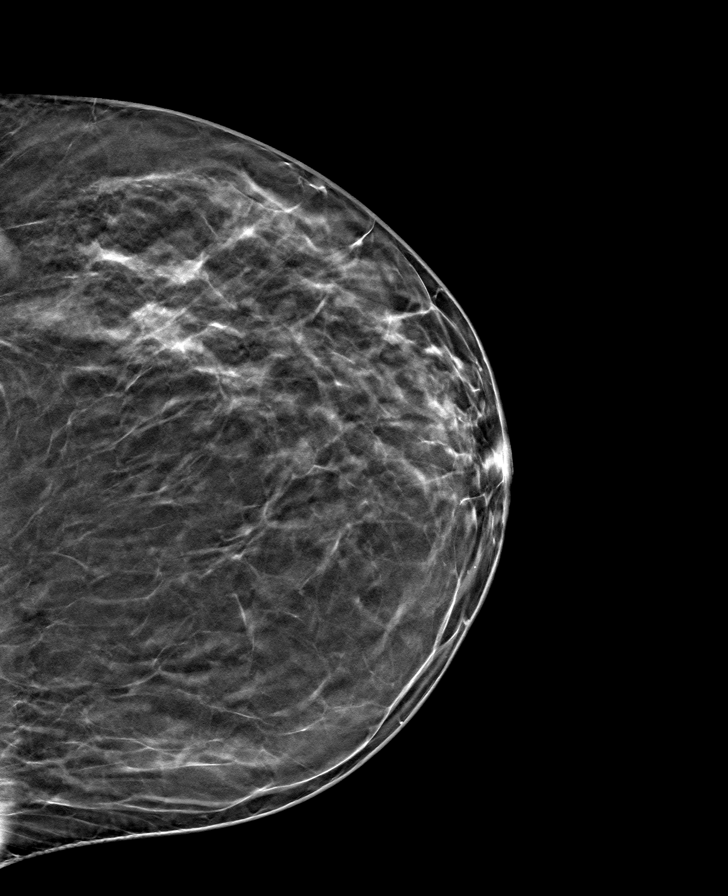

[L MLO tomo · tomo slice 42/83.0]
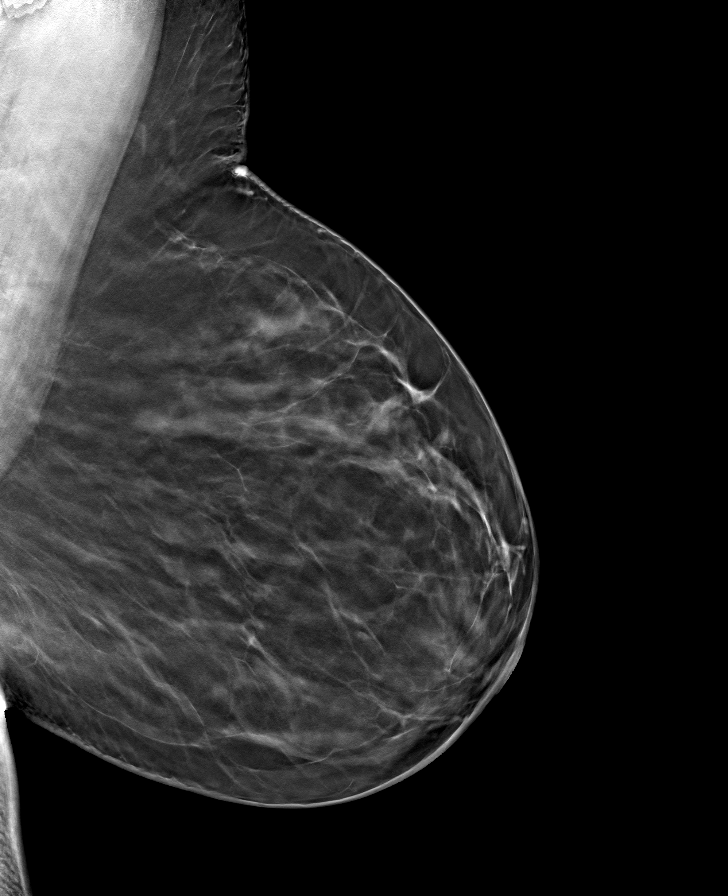

[8 of 24 positions shown; findings below may reference images not displayed]

ACR Breast Density Category b: There are scattered areas of
fibroglandular density.
FINDINGS: There are no findings suspicious for malignancy. Images were
processed with CAD.
IMPRESSION: No mammographic evidence of malignancy. A result letter of this
screening mammogram will be mailed directly to the patient.

RECOMMENDATION:
Screening mammogram in one year. (Code:[TQ])

BI-RADS CATEGORY  1: Negative.

## 2020-09-14 ENCOUNTER — Encounter: Payer: Self-pay | Admitting: Nurse Practitioner

## 2020-09-15 ENCOUNTER — Other Ambulatory Visit: Payer: Self-pay | Admitting: Nurse Practitioner

## 2020-09-15 ENCOUNTER — Ambulatory Visit: Payer: 59 | Admitting: Nurse Practitioner

## 2020-09-15 MED ORDER — LOSARTAN POTASSIUM-HCTZ 50-12.5 MG PO TABS: 1.0000 | ORAL_TABLET | Freq: Every day | ORAL | 3 refills | Status: DC

## 2020-10-05 ENCOUNTER — Other Ambulatory Visit: Payer: Self-pay

## 2020-10-05 ENCOUNTER — Ambulatory Visit (INDEPENDENT_AMBULATORY_CARE_PROVIDER_SITE_OTHER): Payer: 59 | Admitting: Podiatry

## 2020-10-05 DIAGNOSIS — L600 Ingrowing nail: Secondary | ICD-10-CM

## 2020-10-05 DIAGNOSIS — B351 Tinea unguium: Secondary | ICD-10-CM

## 2020-10-05 NOTE — Patient Instructions (Signed)

## 2020-10-06 ENCOUNTER — Other Ambulatory Visit: Payer: Self-pay | Admitting: Nurse Practitioner

## 2020-10-07 ENCOUNTER — Other Ambulatory Visit: Payer: Self-pay | Admitting: Nurse Practitioner

## 2020-10-10 ENCOUNTER — Other Ambulatory Visit: Payer: Self-pay | Admitting: Nurse Practitioner

## 2020-10-10 NOTE — Progress Notes (Signed)
Subjective: 44 year old female presents the office for evaluation of her right big toenail becoming thickened discolored.  She has become ingrown along the borders as well causing discomfort.  The nails have grown and thickened discolored it rubs causing irritation inside shoes.  Denies any drainage or pus or any swelling or redness otherwise. Denies any systemic complaints such as fevers, chills, nausea, vomiting. No acute changes since last appointment, and no other complaints at this time.   Objective: AAO x3, NAD DP/PT pulses palpable bilaterally, CRT less than 3 seconds Right hallux toenail significantly hypertrophic, dystrophic with yellow-brown discoloration.  There is tenderness in the nail borders as well as the dorsal toenail.  There is no edema, erythema or any signs of infection noted today otherwise.  No open lesions. No pain with calf compression, swelling, warmth, erythema  Assessment: Right hallux onychodystrophy with ingrown toenail  Plan: -All treatment options discussed with the patient including all alternatives, risks, complications.  -After discussion looks to proceed with total nail avulsion.  We will do this temporarily as the nail will hopefully grow back and we discussed toenail avulsion with chemical matricectomy.  If the nail comes back in the same likely do this next time. -At this time, recommended total nail removal without chemical matricectomy to the right hallux toenail given ingrown toenail, thickening of the nail.  Risks and complications were discussed with the patient for which they understand and  verbally consent to the procedure. Under sterile conditions a total of 3 mL of a mixture of 2% lidocaine plain and 0.5% Marcaine plain was infiltrated in a hallux block fashion. Once anesthetized, the skin was prepped in sterile fashion. A tourniquet was then applied. Next the right hallux nail excised making sure to remove the entire offending nail border. Once the  nail was removed, the area was debrided and the underlying skin was intact. The area was irrigated and hemostasis was obtained.  No purulence or sign of infection was noted.  A dry sterile dressing was applied. After application of the dressing the tourniquet was removed and there is found to be an immediate capillary refill time to the digit. The patient tolerated the procedure well any complications. Post procedure instructions were discussed the patient for which he verbally understood. Follow-up in one week for nail check or sooner if any problems are to arise. Discussed signs/symptoms of worsening infection and directed to call the office immediately should any occur or go directly to the emergency room. In the meantime, encouraged to call the office with any questions, concerns, changes symptoms.  -Patient encouraged to call the office with any questions, concerns, change in symptoms.

## 2020-10-12 ENCOUNTER — Other Ambulatory Visit: Payer: Self-pay | Admitting: Nurse Practitioner

## 2020-10-18 ENCOUNTER — Ambulatory Visit: Payer: 59 | Admitting: Physician Assistant

## 2020-10-19 ENCOUNTER — Ambulatory Visit (INDEPENDENT_AMBULATORY_CARE_PROVIDER_SITE_OTHER): Payer: 59 | Admitting: Podiatry

## 2020-10-19 ENCOUNTER — Other Ambulatory Visit: Payer: Self-pay

## 2020-10-19 DIAGNOSIS — B351 Tinea unguium: Secondary | ICD-10-CM | POA: Diagnosis not present

## 2020-10-19 DIAGNOSIS — L600 Ingrowing nail: Secondary | ICD-10-CM

## 2020-10-19 NOTE — Patient Instructions (Signed)

## 2020-10-21 NOTE — Progress Notes (Signed)
Subjective: Anahla Bevis is a 44 y.o.  female returns to office today for follow up evaluation after having right hallux totalnail avulsion performed. Patient has been soaking using Epsom salts and applying topical antibiotic covered with bandaid daily.  She states that it is doing well and not see any drainage or pus or any significant pain.  Patient denies fevers, chills, nausea, vomiting. Denies any calf pain, chest pain, SOB.   Objective:  General: Well developed, nourished, in no acute distress, alert and oriented x3   Dermatology: Skin is warm, dry and supple bilateral. Right hallux nail border appears to be clean, dry, with minimal granular tissue and surrounding scab. There is no surrounding erythema, edema, drainage/purulence. The remaining nails appear unremarkable at this time. There are no other lesions or other signs of infection present.  Neurovascular status: Intact. No lower extremity swelling; No pain with calf compression bilateral.  Musculoskeletal: No tenderness to palpation of the right hallux nail fold. Muscular strength within normal limits bilateral.   Assesement and Plan: S/p partial nail avulsion, doing well.   -Continue soaking in epsom salts twice a day followed by antibiotic ointment and a band-aid. Can leave uncovered at night. Continue this until completely healed.  -If the area has not healed in 2 weeks, call the office for follow-up appointment, or sooner if any problems arise.  -Also the also prescribed can know the next couple months inserts are going thicker discolored she is going to let me know so he can start treatment to hopefully help with this. -Monitor for any signs/symptoms of infection. Call the office immediately if any occur or go directly to the emergency room. Call with any questions/concerns.  Celesta Gentile, DPM

## 2020-10-23 ENCOUNTER — Other Ambulatory Visit: Payer: Self-pay | Admitting: Family Medicine

## 2020-10-23 DIAGNOSIS — E1165 Type 2 diabetes mellitus with hyperglycemia: Secondary | ICD-10-CM

## 2020-10-23 DIAGNOSIS — IMO0002 Reserved for concepts with insufficient information to code with codable children: Secondary | ICD-10-CM

## 2020-10-26 ENCOUNTER — Ambulatory Visit: Payer: 59 | Attending: Physician Assistant | Admitting: Pharmacist

## 2020-10-26 ENCOUNTER — Other Ambulatory Visit: Payer: Self-pay

## 2020-10-26 ENCOUNTER — Encounter: Payer: Self-pay | Admitting: Pharmacist

## 2020-10-26 DIAGNOSIS — IMO0002 Reserved for concepts with insufficient information to code with codable children: Secondary | ICD-10-CM

## 2020-10-26 DIAGNOSIS — K219 Gastro-esophageal reflux disease without esophagitis: Secondary | ICD-10-CM

## 2020-10-26 DIAGNOSIS — E118 Type 2 diabetes mellitus with unspecified complications: Secondary | ICD-10-CM

## 2020-10-26 DIAGNOSIS — E1165 Type 2 diabetes mellitus with hyperglycemia: Secondary | ICD-10-CM

## 2020-10-26 DIAGNOSIS — I10 Essential (primary) hypertension: Secondary | ICD-10-CM | POA: Diagnosis not present

## 2020-10-26 DIAGNOSIS — D508 Other iron deficiency anemias: Secondary | ICD-10-CM | POA: Diagnosis not present

## 2020-10-26 MED ORDER — AMLODIPINE BESYLATE 5 MG PO TABS: 2.5000 mg | ORAL_TABLET | Freq: Every day | ORAL | 1 refills | Status: DC

## 2020-10-26 MED ORDER — PANTOPRAZOLE SODIUM 40 MG PO TBEC: 40.0000 mg | DELAYED_RELEASE_TABLET | Freq: Every day | ORAL | 1 refills | Status: DC

## 2020-10-26 MED ORDER — PROPRANOLOL HCL 20 MG PO TABS: 20.0000 mg | ORAL_TABLET | Freq: Two times a day (BID) | ORAL | 1 refills | Status: DC

## 2020-10-26 MED ORDER — FERROUS SULFATE 325 (65 FE) MG PO TABS: 325.0000 mg | ORAL_TABLET | Freq: Two times a day (BID) | ORAL | 1 refills | Status: DC

## 2020-10-26 MED ORDER — NORGESTIM-ETH ESTRAD TRIPHASIC 0.18/0.215/0.25 MG-35 MCG PO TABS: 1.0000 | ORAL_TABLET | Freq: Every day | ORAL | 1 refills | Status: DC

## 2020-10-26 MED ORDER — METFORMIN HCL 500 MG PO TABS: 500.0000 mg | ORAL_TABLET | Freq: Two times a day (BID) | ORAL | 1 refills | Status: DC

## 2020-10-26 NOTE — Progress Notes (Signed)
   S:    PCP: Zelda   Patient arrives in good spirits. Presents to the clinic for hypertension evaluation, counseling, and management. Patient was referred and last seen by Primary Care Provider on 06/07/2020.  Patient reports adherence with medications. Took today.   Current BP Medications include: amlodipine 2.5 mg daily, Losartan-HCTZ 50-12.5 mg daily, propranolol 20 mg BID  Dietary habits include: tries to limit/cut back on salt; consumes coffee daily Exercise habits include: none outside of work Family / Social history:  -  DM (mother, father), HTN, (mother) - Tobacco: never smoker - Alcohol: rare   O:  Vitals:   10/26/20 1528  BP: 132/80    Last 3 Office BP readings: BP Readings from Last 3 Encounters:  10/26/20 132/80  07/06/20 127/85  06/27/20 124/76   BMET    Component Value Date/Time   NA 137 07/06/2020 0653   NA 140 06/13/2020 1647   K 3.6 07/06/2020 0653   CL 101 07/06/2020 0653   CO2 25 07/06/2020 0653   GLUCOSE 94 07/06/2020 0653   BUN 10 07/06/2020 0653   BUN 10 06/13/2020 1647   CREATININE 0.62 07/06/2020 0653   CREATININE 0.68 06/21/2016 1713   CALCIUM 9.1 07/06/2020 0653   GFRNONAA >60 07/06/2020 0653   GFRNONAA >89 06/21/2016 1713   GFRAA >60 07/06/2020 0653   GFRAA >89 06/21/2016 1713    Renal function: CrCl cannot be calculated (Patient's most recent lab result is older than the maximum 21 days allowed.).  Clinical ASCVD: No  The 10-year ASCVD risk score Mikey Bussing DC Jr., et al., 2013) is: 3.1%   Values used to calculate the score:     Age: 44 years     Sex: Female     Is Non-Hispanic African American: No     Diabetic: Yes     Tobacco smoker: No     Systolic Blood Pressure: 501 mmHg     Is BP treated: Yes     HDL Cholesterol: 41 mg/dL     Total Cholesterol: 190 mg/dL  A/P: Hypertension longstanding close to goal today. BP Goal = <130/80 mmHg. Patient is adherent with current medications.  -Continue amlodipine 2.5 mg daily. -Continue  Hyzaar 50-12.5 mg daily.  -Continued current regimen.  -Counseled on lifestyle modifications for blood pressure control including reduced dietary sodium, increased exercise, adequate sleep  Results reviewed and written information provided. Total time in face-to-face counseling 15 minutes.   F/U Clinic Visit with Zelda 11/07/2020.  Benard Halsted, PharmD, [facility] [place], [place] [phone]

## 2020-10-31 ENCOUNTER — Emergency Department (HOSPITAL_COMMUNITY)
Admission: EM | Admit: 2020-10-31 | Discharge: 2020-10-31 | Disposition: A | Discharge status: Home / Self Care | Payer: 59 | Attending: Emergency Medicine | Admitting: Emergency Medicine

## 2020-10-31 ENCOUNTER — Other Ambulatory Visit: Payer: Self-pay | Admitting: Nurse Practitioner

## 2020-10-31 ENCOUNTER — Encounter (HOSPITAL_COMMUNITY): Payer: Self-pay

## 2020-10-31 ENCOUNTER — Other Ambulatory Visit: Payer: Self-pay

## 2020-10-31 DIAGNOSIS — I10 Essential (primary) hypertension: Secondary | ICD-10-CM | POA: Diagnosis not present

## 2020-10-31 DIAGNOSIS — B349 Viral infection, unspecified: Secondary | ICD-10-CM | POA: Insufficient documentation

## 2020-10-31 DIAGNOSIS — R059 Cough, unspecified: Secondary | ICD-10-CM | POA: Diagnosis present

## 2020-10-31 DIAGNOSIS — E119 Type 2 diabetes mellitus without complications: Secondary | ICD-10-CM | POA: Insufficient documentation

## 2020-10-31 DIAGNOSIS — Z20822 Contact with and (suspected) exposure to covid-19: Secondary | ICD-10-CM | POA: Diagnosis not present

## 2020-10-31 DIAGNOSIS — Z7984 Long term (current) use of oral hypoglycemic drugs: Secondary | ICD-10-CM | POA: Diagnosis not present

## 2020-10-31 DIAGNOSIS — Z79899 Other long term (current) drug therapy: Secondary | ICD-10-CM | POA: Diagnosis not present

## 2020-10-31 LAB — RESP PANEL BY RT-PCR (FLU A&B, COVID) ARPGX2
Influenza A by PCR: NEGATIVE
Influenza B by PCR: NEGATIVE
SARS Coronavirus 2 by RT PCR: NEGATIVE

## 2020-10-31 MED ORDER — DOXYCYCLINE HYCLATE 100 MG PO TABS
100.0000 mg | ORAL_TABLET | Freq: Once | ORAL | Status: AC
  Administered 2020-10-31: 100 mg via ORAL
  Filled 2020-10-31: qty 1

## 2020-10-31 MED ORDER — DOXYCYCLINE HYCLATE 100 MG PO CAPS: 100.0000 mg | ORAL_CAPSULE | Freq: Two times a day (BID) | ORAL | 0 refills | Status: AC

## 2020-10-31 MED ORDER — BENZONATATE 200 MG PO CAPS: 200.0000 mg | ORAL_CAPSULE | Freq: Two times a day (BID) | ORAL | 0 refills | Status: DC | PRN

## 2020-10-31 NOTE — ED Provider Notes (Signed)
[facility] DEPT Provider Note   CSN: [id number] Arrival date & time: 10/31/20  1534     History Chief Complaint  Patient presents with  . Covid Exposure  . chest congestion    Angela Castillo is a 44 y.o. female with a history of type 2 diabetes present emerge department concern for Covid exposure.  Patient reports that she began having cough and congestion 3 days ago on December 21.  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  The patient has received a dose of the vaccine but no booster.  She does report a productive cough and says she has had pneumonia in the past.  She denies severe shortness of breath, lightheadedness.  She denies any lung problems or medical issues with her lungs prior to this.  HPI     Past Medical History:  Diagnosis Date  . Anemia Dx January 03 2015  . Diabetes mellitus without complication ([place])   . Elevated liver enzymes   . Essential hypertension Dx Apr 2016  . Fatty liver   . H/O: upper GI bleed 03/2019    variceal bleed   . History of blood transfusion 03/2019  . Liver cirrhosis secondary to NASH ([place])   . Pneumonia 03/2019  . Reflux   . Thrombocytopenia [facility])     Patient Active Problem List   Diagnosis Date Noted  . Ingrown toenail 10/16/2019  . Cirrhosis ([place]) 07/23/2019  . Esophageal varices ([place]) 07/23/2019  . Diabetes mellitus ([place]) 04/20/2019  . Acute respiratory insufficiency   . Respiratory failure ([place])   . Shock circulatory ([place])   . H/O: upper GI bleed 03/21/2019  . Impingement syndrome of left shoulder 04/21/2017  . Low back pain 03/18/2016  . Left shoulder pain 03/18/2016  . Esophageal reflux 03/18/2016  . Lateral pain of left hip 10/25/2015  . Heel pain, bilateral 05/03/2015  . Obesity (BMI 30-39.9) 05/03/2015  . Hearing loss in right ear 03/01/2015  . Family history of diabetes mellitus in mother 03/01/2015  . Essential hypertension 01/18/2015  . Anemia, iron  deficiency 01/04/2015  . Musculoskeletal chest pain 01/04/2015  . Thrombocytopenia ([place]) 01/04/2015  . Transaminitis 01/03/2015    Past Surgical History:  Procedure Laterality Date  . ESOPHAGEAL BANDING  03/22/2019   Procedure: ESOPHAGEAL BANDING;  Surgeon: Wilford Corner, MD;  [facility]: [facility];  Service: Endoscopy;;  . ESOPHAGEAL BANDING  03/23/2019   Procedure: ESOPHAGEAL BANDING;  Surgeon: Wilford Corner, MD;  [facility];  Service: Endoscopy;;  . ESOPHAGEAL BANDING  09/20/2019   Procedure: ESOPHAGEAL BANDING;  Surgeon: Wilford Corner, MD;  [facility];  Service: Endoscopy;;  . ESOPHAGEAL BANDING N/A 12/21/2019   Procedure: ESOPHAGEAL BANDING;  Surgeon: Wilford Corner, MD;  [facility];  Service: Endoscopy;  Laterality: N/A;  . ESOPHAGOGASTRODUODENOSCOPY N/A 03/22/2019   Procedure: ESOPHAGOGASTRODUODENOSCOPY (EGD);  Surgeon: Wilford Corner, MD;  [facility];  Service: Endoscopy;  Laterality: N/A;  . ESOPHAGOGASTRODUODENOSCOPY N/A 03/23/2019   Procedure: ESOPHAGOGASTRODUODENOSCOPY (EGD);  Surgeon: Wilford Corner, MD;  Location: [facility];  Service: Endoscopy;  Laterality: N/A;  . ESOPHAGOGASTRODUODENOSCOPY (EGD) WITH PROPOFOL N/A 07/23/2019   Procedure: ESOPHAGOGASTRODUODENOSCOPY (EGD) WITH PROPOFOL with possible banding;  Surgeon: Wilford Corner, MD;  [facility];  Service: Endoscopy;  Laterality: N/A;  . ESOPHAGOGASTRODUODENOSCOPY (EGD) WITH PROPOFOL N/A 09/20/2019   Procedure: ESOPHAGOGASTRODUODENOSCOPY (EGD) WITH PROPOFOL;  Surgeon: Wilford Corner, MD;  [facility];  Service: Endoscopy;  Laterality: N/A;  . ESOPHAGOGASTRODUODENOSCOPY (EGD) WITH PROPOFOL N/A  12/21/2019   Procedure: ESOPHAGOGASTRODUODENOSCOPY (EGD) WITH PROPOFOL with possible banding;  Surgeon: Wilford Corner, MD;  [facility];  Service: Endoscopy;  Laterality: N/A;  . TUBAL LIGATION  Jul 25, 2000  . VENTRAL HERNIA  REPAIR N/A 07/06/2020   Procedure: VENTRAL HERNIA REPAIR WITH MESH;  Surgeon: Coralie Keens, MD;  Location: [facility];  Service: General;  Laterality: N/A;     OB History   No obstetric history on file.     Family History  Problem Relation Age of Onset  . COPD Mother   . Esophageal cancer Mother   . Diabetes Mother   . Cancer Mother   . Hypertension Mother   . Diabetes Father   . Breast cancer Maternal Aunt     Social History   Tobacco Use  . Smoking status: Never Smoker  . Smokeless tobacco: Never Used  Vaping Use  . Vaping Use: Never used  Substance Use Topics  . Alcohol use: Yes    Comment: rarely-last alcohol drank in 08/2018  . Drug use: No    Home Medications Prior to Admission medications   Medication Sig Start Date End Date Taking? Authorizing Provider  doxycycline (VIBRAMYCIN) 100 MG capsule Take 1 capsule (100 mg total) by mouth 2 (two) times daily for 7 days. 10/31/20 11/07/20 Yes Mihailo Sage, Carola Rhine, MD  acetaminophen (TYLENOL) 500 MG tablet Take 500-1,000 mg by mouth every 6 (six) hours as needed for mild pain.    [provider]  amitriptyline (ELAVIL) 25 MG tablet Take 1 tablet (25 mg total) by mouth at bedtime. Patient taking differently: Take 25 mg by mouth at bedtime as needed for sleep.  05/25/20 08/23/20  Charlott Rakes, MD  amLODipine (NORVASC) 5 MG tablet Take 0.5 tablets (2.5 mg total) by mouth daily. 10/26/20 01/24/21  Charlott Rakes, MD  benzonatate (TESSALON) 200 MG capsule Take 1 capsule (200 mg total) by mouth 2 (two) times daily as needed for cough. 10/31/20   Gildardo Pounds, NP  blood glucose meter kit and supplies KIT Dispense based on patient and insurance preference. Use up to four times daily as directed. (FOR ICD-9 250.00, 250.01). Dispense Relion 03/29/19   Raiford Noble Latif, DO  ferrous sulfate 325 (65 FE) MG tablet Take 1 tablet (325 mg total) by mouth 2 (two) times daily with a meal. 10/26/20   Newlin, Enobong, MD  fluticasone  (FLONASE) 50 MCG/ACT nasal spray SHAKE LIQUID AND USE 1 SPRAY IN EACH NOSTRIL DAILY AS NEEDED FOR ALLERGIES OR EAR PRESSURE 07/31/20   Gildardo Pounds, NP  glucose blood (CONTOUR NEXT TEST) test strip USE UP TO FOUR TIMES DAILY AS DIRECTED 01/31/20   Charlott Rakes, MD  losartan-hydrochlorothiazide (HYZAAR) 50-12.5 MG tablet Take 1 tablet by mouth daily. 09/15/20   Gildardo Pounds, NP  metFORMIN (GLUCOPHAGE) 500 MG tablet Take 1 tablet (500 mg total) by mouth 2 (two) times daily with a meal. 10/26/20   Charlott Rakes, MD  Microlet Lancets MISC USE UP TO FOUR TIMES DAILY AS DIRECTED 01/31/20   Charlott Rakes, MD  Norgestimate-Ethinyl Estradiol Triphasic (TRI-ESTARYLLA) 0.18/0.215/0.25 MG-35 MCG tablet Take 1 tablet by mouth daily. 10/26/20   Charlott Rakes, MD  oxyCODONE (OXY IR/ROXICODONE) 5 MG immediate release tablet Take 1 tablet (5 mg total) by mouth every 6 (six) hours as needed for moderate pain or severe pain. 07/06/20   Coralie Keens, MD  pantoprazole (PROTONIX) 40 MG tablet Take 1 tablet (40 mg total) by mouth daily. 10/26/20 01/24/21  Newlin,  Enobong, MD  propranolol (INDERAL) 20 MG tablet Take 1 tablet (20 mg total) by mouth 2 (two) times daily. 10/26/20 04/24/21  Charlott Rakes, MD    Allergies    Nsaids  Review of Systems   Review of Systems  Constitutional: Positive for chills, fatigue and fever.  HENT: Negative for ear pain and sore throat.   Eyes: Negative for pain and visual disturbance.  Respiratory: Positive for cough and shortness of breath.   Cardiovascular: Negative for chest pain and palpitations.  Gastrointestinal: Negative for abdominal pain and vomiting.  Genitourinary: Negative for dysuria and hematuria.  Musculoskeletal: Positive for arthralgias and myalgias.  Skin: Negative for color change and rash.  Neurological: Positive for headaches. Negative for syncope.  Psychiatric/Behavioral: Negative for agitation and confusion.  All other systems reviewed and are  negative.   Physical Exam Updated Vital Signs BP (!) 159/110 (BP Location: Right Arm) Comment: Patiet states she has not taken her BP meds.  Pulse 89   Temp 98.3 F (36.8 C) (Oral)   Resp 15   Ht 5' 3.5" (1.613 m)   Wt 104.8 kg   LMP 10/17/2020   SpO2 98%   BMI 40.28 kg/m   Physical Exam Vitals and nursing note reviewed.  Constitutional:      Appearance: She is well-developed and well-nourished. She is obese.  HENT:     Head: Normocephalic and atraumatic.  Eyes:     Conjunctiva/sclera: Conjunctivae normal.  Cardiovascular:     Rate and Rhythm: Normal rate and regular rhythm.     Pulses: Normal pulses.  Pulmonary:     Effort: Pulmonary effort is normal. No respiratory distress.     Comments: Crackles in right lower lobe Abdominal:     General: There is no distension.     Palpations: Abdomen is soft.     Tenderness: There is no abdominal tenderness.  Musculoskeletal:        General: No edema.     Cervical back: Neck supple.  Skin:    General: Skin is warm and dry.  Neurological:     Mental Status: She is alert.  Psychiatric:        Mood and Affect: Mood and affect normal.     ED Results / Procedures / Treatments   Labs (all labs ordered are listed, but only abnormal results are displayed) Labs Reviewed  RESP PANEL BY RT-PCR (FLU A&B, COVID) [id number]    EKG None  Radiology No results found.  Procedures Procedures (including critical care time)  Medications Ordered in ED Medications  doxycycline (VIBRA-TABS) tablet 100 mg (100 mg Oral Given 10/31/20 2142)    ED Course  I have reviewed the triage vital signs and the nursing notes.  Pertinent labs & imaging results that were available during my care of the patient were reviewed by me and considered in my medical decision making (see chart for details).  44 yo female here with cough, congestion x 3 days Concern for covid exposure No hypoxia on arrival, vitals wnl.  Will test for covid/flu Discussed  watch and wait doxycycline script given her obesity, hx of PNA, hypoventilation   Angela Castillo was evaluated in Emergency Department on 10/31/2020 for the symptoms described in the history of present illness. She was evaluated in the context of the global COVID-19 pandemic, which necessitated consideration that the patient might be at risk for infection with the SARS-CoV-2 virus that causes COVID-19. Institutional protocols and algorithms that pertain to the evaluation of patients  at risk for COVID-19 are in a state of rapid change based on information released by regulatory bodies including the CDC and federal and state organizations. These policies and algorithms were followed during the patient's care in the ED.    Final Clinical Impression(s) / ED Diagnoses Final diagnoses:  Viral illness    Rx / DC Orders ED Discharge Orders         Ordered    doxycycline (VIBRAMYCIN) 100 MG capsule  2 times daily        10/31/20 2054           Wyvonnia Dusky, MD 10/31/20 [phone]

## 2020-10-31 NOTE — Discharge Instructions (Addendum)
If you are covid positive, it is recommended you quarantine for 10 days from the onset of your symptoms.  You can call to schedule a follow up appointment at the [facility] at the number above.

## 2020-10-31 NOTE — ED Triage Notes (Signed)
Patient states she was exposed to Covid 5 days ago. Patient c/o chest congestion

## 2020-11-07 ENCOUNTER — Encounter: Payer: Self-pay | Admitting: Nurse Practitioner

## 2020-11-07 ENCOUNTER — Ambulatory Visit: Payer: 59 | Attending: Nurse Practitioner | Admitting: Nurse Practitioner

## 2020-11-07 ENCOUNTER — Other Ambulatory Visit: Payer: Self-pay

## 2020-11-07 VITALS — BP 143/87 | HR 82 | Temp 98.8°F | Ht 64.5 in | Wt 236.0 lb

## 2020-11-07 DIAGNOSIS — E1165 Type 2 diabetes mellitus with hyperglycemia: Secondary | ICD-10-CM | POA: Diagnosis not present

## 2020-11-07 DIAGNOSIS — E118 Type 2 diabetes mellitus with unspecified complications: Secondary | ICD-10-CM

## 2020-11-07 DIAGNOSIS — D508 Other iron deficiency anemias: Secondary | ICD-10-CM | POA: Diagnosis not present

## 2020-11-07 DIAGNOSIS — I10 Essential (primary) hypertension: Secondary | ICD-10-CM | POA: Diagnosis not present

## 2020-11-07 DIAGNOSIS — IMO0002 Reserved for concepts with insufficient information to code with codable children: Secondary | ICD-10-CM

## 2020-11-07 LAB — GLUCOSE, POCT (MANUAL RESULT ENTRY): POC Glucose: 110 mg/dl — AB (ref 70–99)

## 2020-11-07 NOTE — Progress Notes (Signed)
Assessment & Plan:  Angela Castillo was seen today for diabetes.  Diagnoses and all orders for this visit:  Diabetes mellitus type 2, uncontrolled, with complications (HCC) -     Glucose (CBG) -     Microalbumin/Creatinine Ratio, Urine -     CMP14+EGFR Continue blood sugar control as discussed in office today, low carbohydrate diet, and regular physical exercise as tolerated, 150 minutes per week (30 min each day, 5 days per week, or 50 min 3 days per week). Keep blood sugar logs with fasting goal of 90-130 mg/dl, post prandial (after you eat) less than 180.  For Hypoglycemia: BS <60 and Hyperglycemia BS >400; contact the clinic ASAP. Annual eye exams and foot exams are recommended.   Essential Hypertension Continue all antihypertensives as prescribed.  Remember to bring in your blood pressure log with you for your follow up appointment.  DASH/Mediterranean Diets are healthier choices for HTN.    Other iron deficiency anemia -     CBC    Patient has been counseled on age-appropriate routine health concerns for screening and prevention. These are reviewed and up-to-date. Referrals have been placed accordingly. Immunizations are up-to-date or declined.    Subjective:   Chief Complaint  Patient presents with   Diabetes    Pt. Is here for diabetes follow up.    HPI Angela Castillo 45 y.o. female presents to office today for follow up. She has a past medical history of Anemia (Dx January 03 2015), Diabetes mellitus without complication, Elevated liver enzymes, Essential hypertension (Dx Apr 2016), Fatty liver, H/O: upper GI bleed (03/2019), Liver cirrhosis secondary to NASH, Pneumonia (03/2019), Reflux, and Thrombocytopenia ([place]).   DM2 Average home readings range 70-170s. Well controlled with metformin 500 mg BID. She denies any hypo or hyperglycemic symptoms. LDL not at goal. She has a history of NASH and cirrhosis. Currently not on any statin medications.  Lab Results   Component Value Date   HGBA1C 5.5 06/13/2020   Lab Results  Component Value Date   LDLCALC 124 (H) 06/13/2020   Essential Hypertension Slightly elevated. She is taking amlodipine 2.5 mg daily and  losartan 50-12.5 mg daily as prescribed. Unable to take more than 2.5 mg due to symptoms. Needs to work on weight loss and dietary modifications. Will need to increase losartan-hctz if blood pressure remains elevated. Denies chest pain, shortness of breath, palpitations, lightheadedness, dizziness, headaches or BLE edema.  BP Readings from Last 3 Encounters:  11/07/20 (!) 143/87  10/31/20 (!) 159/110  10/26/20 132/80   Review of Systems  Constitutional: Negative for fever, malaise/fatigue and weight loss.  HENT: Negative.  Negative for nosebleeds.   Eyes: Negative.  Negative for blurred vision, double vision and photophobia.  Respiratory: Negative.  Negative for cough and shortness of breath.   Cardiovascular: Negative.  Negative for chest pain, palpitations and leg swelling.  Gastrointestinal: Negative.  Negative for heartburn, nausea and vomiting.  Musculoskeletal: Negative.  Negative for myalgias.  Neurological: Negative.  Negative for dizziness, focal weakness, seizures and headaches.  Psychiatric/Behavioral: Negative.  Negative for suicidal ideas.    Past Medical History:  Diagnosis Date   Anemia Dx January 03 2015   Diabetes mellitus without complication ([place])    Elevated liver enzymes    Essential hypertension Dx Apr 2016   Fatty liver    H/O: upper GI bleed 03/2019    variceal bleed    History of blood transfusion 03/2019   Liver cirrhosis secondary to NASH ([place])  Pneumonia 03/2019   Reflux    Thrombocytopenia (HCC)     Past Surgical History:  Procedure Laterality Date   ESOPHAGEAL BANDING  03/22/2019   Procedure: ESOPHAGEAL BANDING;  Surgeon: Wilford Corner, MD;  [facility];  Service: Endoscopy;;   ESOPHAGEAL BANDING  03/23/2019   Procedure:  ESOPHAGEAL BANDING;  Surgeon: Wilford Corner, MD;  [facility];  Service: Endoscopy;;   ESOPHAGEAL BANDING  09/20/2019   Procedure: ESOPHAGEAL BANDING;  Surgeon: Wilford Corner, MD;  [facility];  Service: Endoscopy;;   ESOPHAGEAL BANDING N/A 12/21/2019   Procedure: ESOPHAGEAL BANDING;  Surgeon: Wilford Corner, MD;  [facility];  Service: Endoscopy;  Laterality: N/A;   ESOPHAGOGASTRODUODENOSCOPY N/A 03/22/2019   Procedure: ESOPHAGOGASTRODUODENOSCOPY (EGD);  Surgeon: Wilford Corner, MD;  [facility];  Service: Endoscopy;  Laterality: N/A;   ESOPHAGOGASTRODUODENOSCOPY N/A 03/23/2019   Procedure: ESOPHAGOGASTRODUODENOSCOPY (EGD);  Surgeon: Wilford Corner, MD;  [facility];  Service: Endoscopy;  Laterality: N/A;   ESOPHAGOGASTRODUODENOSCOPY (EGD) WITH PROPOFOL N/A 07/23/2019   Procedure: ESOPHAGOGASTRODUODENOSCOPY (EGD) WITH PROPOFOL with possible banding;  Surgeon: Wilford Corner, MD;  [facility];  Service: Endoscopy;  Laterality: N/A;   ESOPHAGOGASTRODUODENOSCOPY (EGD) WITH PROPOFOL N/A 09/20/2019   Procedure: ESOPHAGOGASTRODUODENOSCOPY (EGD) WITH PROPOFOL;  Surgeon: Wilford Corner, MD;  [facility];  Service: Endoscopy;  Laterality: N/A;   ESOPHAGOGASTRODUODENOSCOPY (EGD) WITH PROPOFOL N/A 12/21/2019   Procedure: ESOPHAGOGASTRODUODENOSCOPY (EGD) WITH PROPOFOL with possible banding;  Surgeon: Wilford Corner, MD;  [facility];  Service: Endoscopy;  Laterality: N/A;   TUBAL LIGATION  Jul 25, 2000   VENTRAL HERNIA REPAIR N/A 07/06/2020   Procedure: VENTRAL HERNIA REPAIR WITH MESH;  Surgeon: Coralie Keens, MD;  Location: [facility];  Service: General;  Laterality: N/A;    Family History  Problem Relation Age of Onset   COPD Mother    Esophageal cancer Mother    Diabetes Mother    Cancer Mother    Hypertension Mother    Diabetes Father    Breast cancer Maternal Aunt      Social History Reviewed with no changes to be made today.   Outpatient Medications Prior to Visit  Medication Sig Dispense Refill   acetaminophen (TYLENOL) 500 MG tablet Take 500-1,000 mg by mouth every 6 (six) hours as needed for mild pain.     amLODipine (NORVASC) 5 MG tablet Take 0.5 tablets (2.5 mg total) by mouth daily. 45 tablet 1   benzonatate (TESSALON) 200 MG capsule Take 1 capsule (200 mg total) by mouth 2 (two) times daily as needed for cough. 20 capsule 0   blood glucose meter kit and supplies KIT Dispense based on patient and insurance preference. Use up to four times daily as directed. (FOR ICD-9 250.00, 250.01). Dispense Relion 1 each 0   doxycycline (VIBRAMYCIN) 100 MG capsule Take 1 capsule (100 mg total) by mouth 2 (two) times daily for 7 days. 14 capsule 0   fluticasone (FLONASE) 50 MCG/ACT nasal spray SHAKE LIQUID AND USE 1 SPRAY IN EACH NOSTRIL DAILY AS NEEDED FOR ALLERGIES OR EAR PRESSURE 16 g 1   glucose blood (CONTOUR NEXT TEST) test strip USE UP TO FOUR TIMES DAILY AS DIRECTED 100 strip 6   losartan-hydrochlorothiazide (HYZAAR) 50-12.5 MG tablet Take 1 tablet by mouth daily. 90 tablet 3   metFORMIN (GLUCOPHAGE) 500 MG tablet Take 1 tablet (500 mg total) by mouth 2 (two) times daily with a meal. 180 tablet 1   Microlet Lancets MISC USE UP TO FOUR  TIMES DAILY AS DIRECTED 100 each 11   Norgestimate-Ethinyl Estradiol Triphasic (TRI-ESTARYLLA) 0.18/0.215/0.25 MG-35 MCG tablet Take 1 tablet by mouth daily. 84 tablet 1   pantoprazole (PROTONIX) 40 MG tablet Take 1 tablet (40 mg total) by mouth daily. 90 tablet 1   propranolol (INDERAL) 20 MG tablet Take 1 tablet (20 mg total) by mouth 2 (two) times daily. 180 tablet 1   amitriptyline (ELAVIL) 25 MG tablet Take 1 tablet (25 mg total) by mouth at bedtime. (Patient taking differently: Take 25 mg by mouth at bedtime as needed for sleep. ) 90 tablet 1   ferrous sulfate 325 (65 FE) MG tablet Take 1 tablet (325 mg  total) by mouth 2 (two) times daily with a meal. (Patient not taking: Reported on 11/07/2020) 180 tablet 1   oxyCODONE (OXY IR/ROXICODONE) 5 MG immediate release tablet Take 1 tablet (5 mg total) by mouth every 6 (six) hours as needed for moderate pain or severe pain. 30 tablet 0   No facility-administered medications prior to visit.    Allergies  Allergen Reactions   Nsaids     GI BLEED       Objective:    BP (!) 143/87 (BP Location: Left Arm, Patient Position: Sitting, Cuff Size: Large)    Pulse 82    Temp 98.8 F (37.1 C) (Oral)    Ht 5' 4.5" (1.638 m)    Wt 236 lb (107 kg)    LMP 10/17/2020    SpO2 97%    BMI 39.88 kg/m  Wt Readings from Last 3 Encounters:  11/07/20 236 lb (107 kg)  10/31/20 231 lb (104.8 kg)  07/06/20 223 lb (101.2 kg)    Physical Exam Vitals and nursing note reviewed.  Constitutional:      Appearance: She is well-developed and well-nourished.  HENT:     Head: Normocephalic and atraumatic.  Eyes:     Extraocular Movements: EOM normal.  Cardiovascular:     Rate and Rhythm: Normal rate and regular rhythm.     Pulses: Intact distal pulses.     Heart sounds: Normal heart sounds. No murmur heard. No friction rub. No gallop.   Pulmonary:     Effort: Pulmonary effort is normal. No tachypnea or respiratory distress.     Breath sounds: Normal breath sounds. No decreased breath sounds, wheezing, rhonchi or rales.  Chest:     Chest wall: No tenderness.  Abdominal:     General: Bowel sounds are normal.     Palpations: Abdomen is soft.  Musculoskeletal:        General: No edema. Normal range of motion.     Cervical back: Normal range of motion.  Skin:    General: Skin is warm and dry.  Neurological:     Mental Status: She is alert and oriented to person, place, and time.     Coordination: Coordination normal.  Psychiatric:        Mood and Affect: Mood and affect normal.        Behavior: Behavior normal. Behavior is cooperative.        Thought Content:  Thought content normal.        Judgment: Judgment normal.          Patient has been counseled extensively about nutrition and exercise as well as the importance of adherence with medications and regular follow-up. The patient was given clear instructions to go to ER or return to medical center if symptoms don't improve, worsen or new problems develop.  The patient verbalized understanding.   Follow-up: Return in about 3 months (around 02/05/2021).   Gildardo Pounds, FNP-BC [facility] [place], [place]   11/12/2020, 3:19 AM

## 2020-11-08 LAB — CMP14+EGFR
ALT: 21 IU/L (ref 0–32)
AST: 24 IU/L (ref 0–40)
Albumin/Globulin Ratio: 1 — ABNORMAL LOW (ref 1.2–2.2)
Albumin: 4.1 g/dL (ref 3.8–4.8)
Alkaline Phosphatase: 87 IU/L (ref 44–121)
BUN/Creatinine Ratio: 15 (ref 9–23)
BUN: 10 mg/dL (ref 6–24)
Bilirubin Total: 0.5 mg/dL (ref 0.0–1.2)
CO2: 27 mmol/L (ref 20–29)
Calcium: 9.4 mg/dL (ref 8.7–10.2)
Chloride: 99 mmol/L (ref 96–106)
Creatinine, Ser: 0.65 mg/dL (ref 0.57–1.00)
GFR calc Af Amer: 125 mL/min/{1.73_m2} (ref >59)
GFR calc non Af Amer: 108 mL/min/{1.73_m2} (ref >59)
Globulin, Total: 4 g/dL (ref 1.5–4.5)
Glucose: 75 mg/dL (ref 65–99)
Potassium: 3.8 mmol/L (ref 3.5–5.2)
Sodium: 139 mmol/L (ref 134–144)
Total Protein: 8.1 g/dL (ref 6.0–8.5)

## 2020-11-08 LAB — CBC
Hematocrit: 38.2 % (ref 34.0–46.6)
Hemoglobin: 12.1 g/dL (ref 11.1–15.9)
MCH: 26.2 pg — ABNORMAL LOW (ref 26.6–33.0)
MCHC: 31.7 g/dL (ref 31.5–35.7)
MCV: 83 fL (ref 79–97)
Platelets: 120 10*3/uL — ABNORMAL LOW (ref 150–450)
RBC: 4.62 x10E6/uL (ref 3.77–5.28)
RDW: 14.3 % (ref 11.7–15.4)
WBC: 6.5 10*3/uL (ref 3.4–10.8)

## 2020-11-08 LAB — MICROALBUMIN / CREATININE URINE RATIO
Creatinine, Urine: 61.4 mg/dL
Microalb/Creat Ratio: 33 mg/g creat — ABNORMAL HIGH (ref 0–29)
Microalbumin, Urine: 20 ug/mL

## 2020-11-10 ENCOUNTER — Encounter: Payer: Self-pay | Admitting: Nurse Practitioner

## 2020-11-12 ENCOUNTER — Encounter: Payer: Self-pay | Admitting: Nurse Practitioner

## 2020-11-22 ENCOUNTER — Other Ambulatory Visit: Payer: Self-pay | Admitting: Family Medicine

## 2020-11-22 ENCOUNTER — Other Ambulatory Visit: Payer: Self-pay | Admitting: Nurse Practitioner

## 2020-11-22 DIAGNOSIS — R519 Headache, unspecified: Secondary | ICD-10-CM

## 2020-12-04 ENCOUNTER — Encounter: Payer: Self-pay | Admitting: Nurse Practitioner

## 2020-12-04 ENCOUNTER — Other Ambulatory Visit: Payer: Self-pay | Admitting: Nurse Practitioner

## 2020-12-04 ENCOUNTER — Other Ambulatory Visit: Payer: Self-pay

## 2020-12-04 ENCOUNTER — Ambulatory Visit (HOSPITAL_COMMUNITY)
Admission: RE | Admit: 2020-12-04 | Discharge: 2020-12-04 | Disposition: A | Discharge status: Home / Self Care | Payer: 59 | Source: Ambulatory Visit | Attending: Nurse Practitioner | Admitting: Nurse Practitioner

## 2020-12-04 DIAGNOSIS — R053 Chronic cough: Secondary | ICD-10-CM

## 2020-12-04 IMAGING — DX DG CHEST 2V
2 series · 2 of 2 positions shown · non-contrast
Comparison: [DATE]

CLINICAL DATA: Chronic cough

EXAM:
CHEST - 2 VIEW

[chest pa]
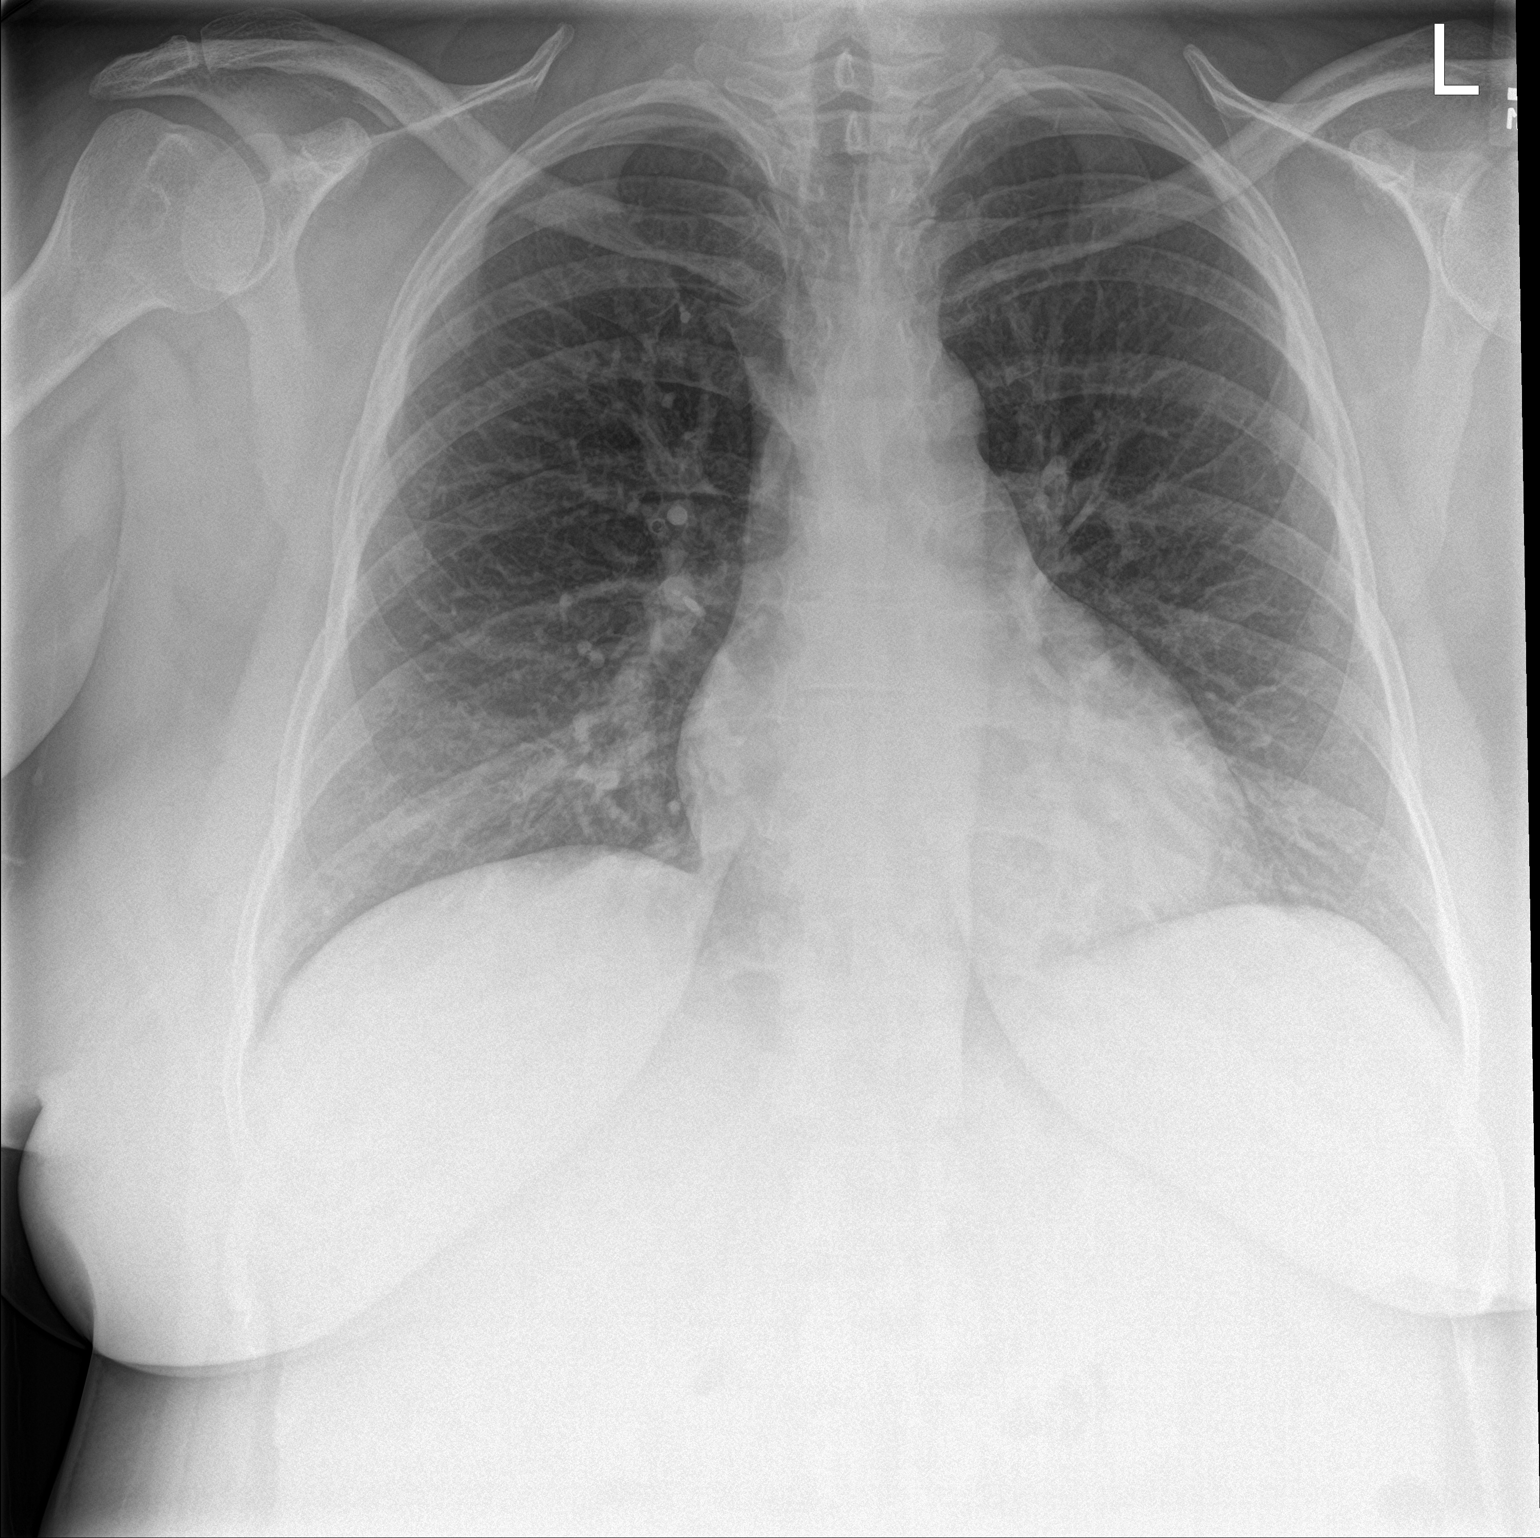

[chest lat]
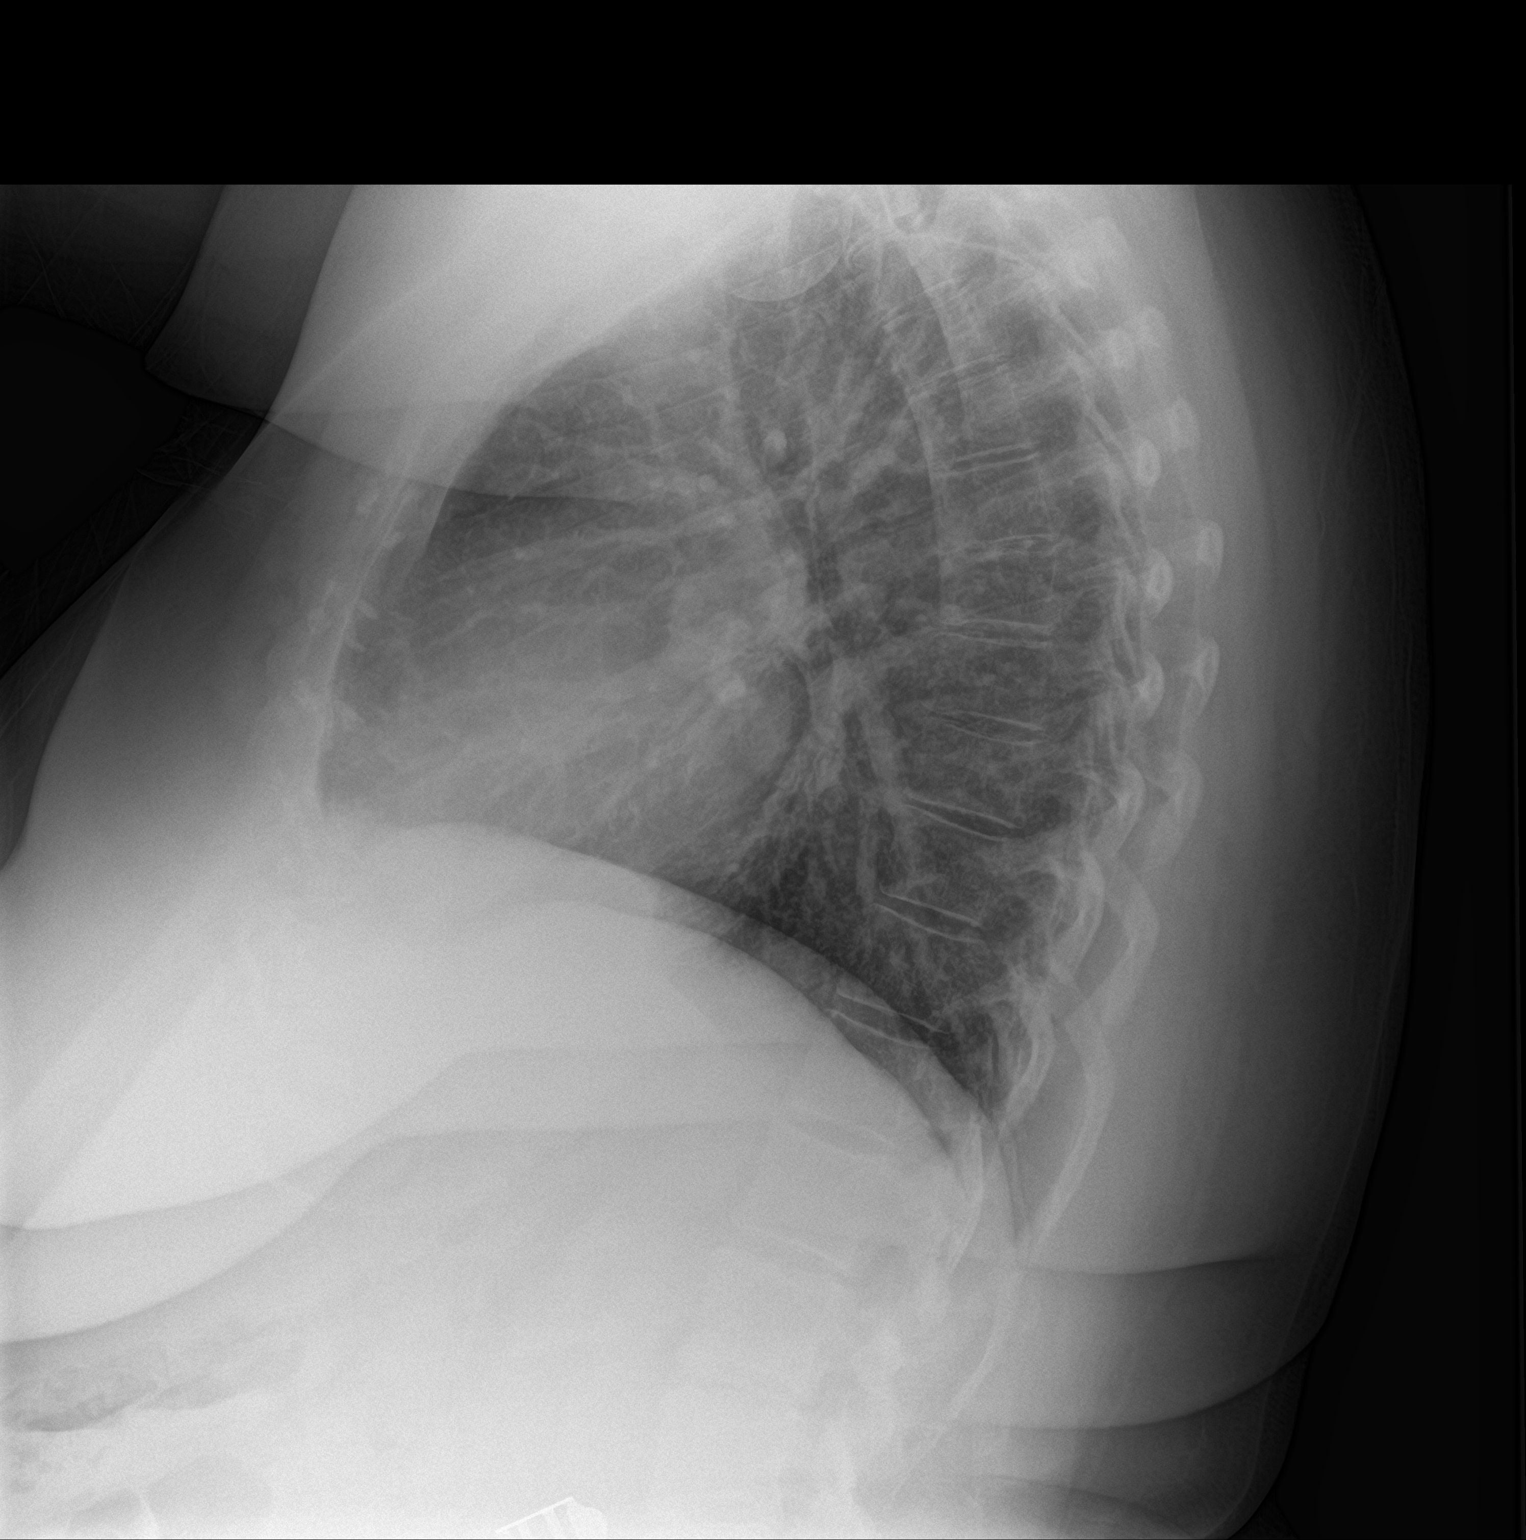

[2 of 2 positions shown; findings below may reference images not displayed]

FINDINGS: The heart size and mediastinal contours are within normal limits.
Both lungs are clear. The visualized skeletal structures are
unremarkable.
IMPRESSION: No active cardiopulmonary disease.

## 2020-12-06 ENCOUNTER — Other Ambulatory Visit: Payer: Self-pay | Admitting: Nurse Practitioner

## 2020-12-06 ENCOUNTER — Other Ambulatory Visit: Payer: Self-pay | Admitting: Family Medicine

## 2020-12-06 MED ORDER — ALBUTEROL SULFATE HFA 108 (90 BASE) MCG/ACT IN AERS: 2.0000 | INHALATION_SPRAY | Freq: Four times a day (QID) | RESPIRATORY_TRACT | 0 refills | Status: DC | PRN

## 2020-12-07 ENCOUNTER — Telehealth: Payer: Self-pay | Admitting: Nurse Practitioner

## 2020-12-07 NOTE — Telephone Encounter (Signed)
Copied from [place] [phone]. Topic: General - Other >> Nov 30, 2020  8:41 AM Celene Kras wrote: Reason for CRM: Misty, from [employer], calling stating that they faxed over a medical records request on 11/29/20. She is requesting to know if this has been received. Please advise.

## 2020-12-21 ENCOUNTER — Other Ambulatory Visit: Payer: Self-pay

## 2020-12-21 ENCOUNTER — Ambulatory Visit (INDEPENDENT_AMBULATORY_CARE_PROVIDER_SITE_OTHER): Payer: 59 | Admitting: Podiatry

## 2020-12-21 DIAGNOSIS — L603 Nail dystrophy: Secondary | ICD-10-CM | POA: Diagnosis not present

## 2020-12-21 DIAGNOSIS — B351 Tinea unguium: Secondary | ICD-10-CM

## 2020-12-21 NOTE — Patient Instructions (Signed)
Start vitamin E oil on the toenails You can also start a biotin supplement (one for hair, skin and nails)

## 2020-12-24 ENCOUNTER — Other Ambulatory Visit: Payer: Self-pay | Admitting: Nurse Practitioner

## 2020-12-26 NOTE — Progress Notes (Signed)
Subjective: Jinna Weinman is a 45 y.o.  female returns to office today for follow up evaluation after having right hallux tonail avulsion performed.  The nail started to grow back in and not causing any issues at this time.  Denies any drainage or pus or any discomfort.  Patient denies fevers, chills, nausea, vomiting. Denies any calf pain, chest pain, SOB.   Objective:  General: Well developed, nourished, in no acute distress, alert and oriented x3   Dermatology: Skin is warm, dry and supple bilateral. Right hallux nail is starting to grow and appears to be clear at this time with mild hypertrophy.  There is no pain in the nail there is no edema, erythema, drainage or pus or any signs of infection currently.    Neurovascular status: Intact. No lower extremity swelling; No pain with calf compression bilateral.  Musculoskeletal: No tenderness to palpation of the right hallux nail. Muscular strength within normal limits bilateral.   Assesement and Plan: Onychodystrophy right hallux nail  -No started come back in.  Patient did clear but is very early stages.  We will continue to monitor the nails grows back and.  Should become discolored or thicker causing issues for me now we will start likely topical medication to help with the toenail -Discussed starting a biotin supplement as well as she can use vitamin E oil on the toenail.  Return if symptoms worsen or fail to improve.  Trula Slade DPM

## 2021-01-08 ENCOUNTER — Encounter: Payer: Self-pay | Admitting: Nurse Practitioner

## 2021-01-09 ENCOUNTER — Other Ambulatory Visit: Payer: Self-pay | Admitting: Nurse Practitioner

## 2021-01-09 DIAGNOSIS — E1165 Type 2 diabetes mellitus with hyperglycemia: Secondary | ICD-10-CM

## 2021-01-09 DIAGNOSIS — D508 Other iron deficiency anemias: Secondary | ICD-10-CM

## 2021-01-09 DIAGNOSIS — IMO0002 Reserved for concepts with insufficient information to code with codable children: Secondary | ICD-10-CM

## 2021-01-09 MED ORDER — METFORMIN HCL 500 MG PO TABS: 500.0000 mg | ORAL_TABLET | Freq: Two times a day (BID) | ORAL | 1 refills | Status: DC

## 2021-01-09 MED ORDER — FERROUS SULFATE 325 (65 FE) MG PO TABS: 325.0000 mg | ORAL_TABLET | Freq: Two times a day (BID) | ORAL | 3 refills | Status: DC

## 2021-01-09 MED ORDER — NORGESTIM-ETH ESTRAD TRIPHASIC 0.18/0.215/0.25 MG-35 MCG PO TABS: 1.0000 | ORAL_TABLET | Freq: Every day | ORAL | 3 refills | Status: DC

## 2021-01-26 ENCOUNTER — Encounter: Payer: Self-pay | Admitting: Nurse Practitioner

## 2021-01-29 ENCOUNTER — Ambulatory Visit: Payer: Self-pay

## 2021-01-29 ENCOUNTER — Ambulatory Visit (INDEPENDENT_AMBULATORY_CARE_PROVIDER_SITE_OTHER): Payer: 59 | Admitting: Physician Assistant

## 2021-01-29 ENCOUNTER — Ambulatory Visit (INDEPENDENT_AMBULATORY_CARE_PROVIDER_SITE_OTHER): Payer: 59

## 2021-01-29 ENCOUNTER — Encounter: Payer: Self-pay | Admitting: Physician Assistant

## 2021-01-29 VITALS — Ht 63.5 in | Wt 247.2 lb

## 2021-01-29 DIAGNOSIS — M25561 Pain in right knee: Secondary | ICD-10-CM

## 2021-01-29 DIAGNOSIS — M25562 Pain in left knee: Secondary | ICD-10-CM

## 2021-01-29 MED ORDER — METHYLPREDNISOLONE ACETATE 40 MG/ML IJ SUSP
40.0000 mg | INTRAMUSCULAR | Status: AC | PRN
  Administered 2021-01-29: 40 mg via INTRA_ARTICULAR

## 2021-01-29 MED ORDER — LIDOCAINE HCL 1 % IJ SOLN
0.5000 mL | INTRAMUSCULAR | Status: AC | PRN
  Administered 2021-01-29: .5 mL

## 2021-01-29 NOTE — Progress Notes (Signed)
Office Visit Note   Patient: Angela Castillo           Date of Birth: [birth year]           MRN: [id number] Visit Date: 01/29/2021              Requested by: Angela Pounds, NP [place],  [place] [id number] PCP: Angela Pounds, NP   Assessment & Plan: Visit Diagnoses:  1. Right knee pain, unspecified chronicity   2. Left knee pain, unspecified chronicity     Plan: She will work on quad strengthening exercises as shown by therapy discussed knee friendly exercises with her.  Have her follow-up in 2 weeks to see what type of response she had to the cortisone injections.  Questions encouraged and answered at length  Follow-Up Instructions: Return in about 2 weeks (around 02/12/2021).   Orders:  Orders Placed This Encounter  Procedures  . Large Joint Inj  . XR Knee 1-2 Views Left  . XR Knee 1-2 Views Right   No orders of the defined types were placed in this encounter.     Procedures: Large Joint Inj: bilateral knee on 01/29/2021 4:38 PM Indications: pain Details: 22 G 1.5 in needle, anterolateral approach  Arthrogram: No  Medications (Right): 0.5 mL lidocaine 1 %; 40 mg methylPREDNISolone acetate 40 MG/ML Medications (Left): 0.5 mL lidocaine 1 %; 40 mg methylPREDNISolone acetate 40 MG/ML Outcome: tolerated well, no immediate complications Procedure, treatment alternatives, risks and benefits explained, specific risks discussed. Consent was given by the patient. Immediately prior to procedure a time out was called to verify the correct patient, procedure, equipment, support staff and site/side marked as required. Patient was prepped and draped in the usual sterile fashion.       Clinical Data: No additional findings.   Subjective: Chief Complaint  Patient presents with  . Right Knee - Pain  . Left Knee - Pain    HPI  Angela Castillo comes in today with bilateral knee pain.  She was last seen in August of last year and was sent to physical  therapy.  She does states she gave the home exercise program from physical therapy.  She has continued to do the exercises.  States she did well until a couple of months ago and developed right knee pain.  Then yesterday her left knee pain returned.  She is having pain posterior aspect of the right knee and the anterior medial aspect of the left knee.  She has had no known injury to either knee.  She notes occasional giving way of both knees no other mechanical symptoms.  She takes Tylenol for pain.  She is also tried Biofreeze and heat.  Last night the left knee kept away.  She is unable to take NSAIDs due to history of GI bleed.  Review of Systems  Constitutional: Negative for chills and fever.  Musculoskeletal: Positive for arthralgias.     Objective: Vital Signs: Ht 5' 3.5" (1.613 m)   Wt 247 lb 3.2 oz (112.1 kg)   BMI 43.10 kg/m   Physical Exam Constitutional:      Appearance: She is not ill-appearing or diaphoretic.  Pulmonary:     Effort: Pulmonary effort is normal.  Neurological:     Mental Status: She is alert and oriented to person, place, and time.  Psychiatric:        Mood and Affect: Mood normal.     Ortho Exam Bilateral knees good range of  motion both knees.  No significant crepitus with passive range of motion both knees.  Tenderness along medial joint line both knees.  No instability valgus varus stressing of either knee.  No abnormal warmth erythema or effusion of either knee.  McMurray's is negative bilaterally. Specialty Comments:  No specialty comments available.  Imaging: XR Knee 1-2 Views Left  Result Date: 01/29/2021 Left knee AP lateral views: No acute fracture.  Mild patellofemoral changes.  Mild medial compartmental narrowing.  Knee is well located.  XR Knee 1-2 Views Right  Result Date: 01/29/2021 Right knee 2 views: No acute fracture.  Knee is well located.  Mild to moderate narrowing medial joint line.  Mild patellofemoral changes.    PMFS  History: Patient Active Problem List   Diagnosis Date Noted  . Ingrown toenail 10/16/2019  . Cirrhosis ([place]) 07/23/2019  . Esophageal varices ([place]) 07/23/2019  . Diabetes mellitus ([place]) 04/20/2019  . Acute respiratory insufficiency   . Respiratory failure ([place])   . Shock circulatory ([place])   . H/O: upper GI bleed 03/21/2019  . Impingement syndrome of left shoulder 04/21/2017  . Low back pain 03/18/2016  . Left shoulder pain 03/18/2016  . Esophageal reflux 03/18/2016  . Lateral pain of left hip 10/25/2015  . Heel pain, bilateral 05/03/2015  . Obesity (BMI 30-39.9) 05/03/2015  . Hearing loss in right ear 03/01/2015  . Family history of diabetes mellitus in mother 03/01/2015  . Essential hypertension 01/18/2015  . Anemia, iron deficiency 01/04/2015  . Musculoskeletal chest pain 01/04/2015  . Thrombocytopenia ([place]) 01/04/2015  . Transaminitis 01/03/2015   Past Medical History:  Diagnosis Date  . Anemia Dx January 03 2015  . Diabetes mellitus without complication ([place])   . Elevated liver enzymes   . Essential hypertension Dx Apr 2016  . Fatty liver   . H/O: upper GI bleed 03/2019    variceal bleed   . History of blood transfusion 03/2019  . Liver cirrhosis secondary to NASH ([place])   . Pneumonia 03/2019  . Reflux   . Thrombocytopenia ([place])     Family History  Problem Relation Age of Onset  . COPD Mother   . Esophageal cancer Mother   . Diabetes Mother   . Cancer Mother   . Hypertension Mother   . Diabetes Father   . Breast cancer Maternal Aunt     Past Surgical History:  Procedure Laterality Date  . ESOPHAGEAL BANDING  03/22/2019   Procedure: ESOPHAGEAL BANDING;  Surgeon: Wilford Corner, MD;  Location: [facility];  Service: Endoscopy;;  . ESOPHAGEAL BANDING  03/23/2019   Procedure: ESOPHAGEAL BANDING;  Surgeon: Wilford Corner, MD;  [facility]: [facility];  Service: Endoscopy;;  . ESOPHAGEAL BANDING  09/20/2019   Procedure: ESOPHAGEAL BANDING;  Surgeon: Wilford Corner, MD;  [facility];  Service: Endoscopy;;  . ESOPHAGEAL BANDING N/A 12/21/2019   Procedure: ESOPHAGEAL BANDING;  Surgeon: Wilford Corner, MD;  [facility];  Service: Endoscopy;  Laterality: N/A;  . ESOPHAGOGASTRODUODENOSCOPY N/A 03/22/2019   Procedure: ESOPHAGOGASTRODUODENOSCOPY (EGD);  Surgeon: Wilford Corner, MD;  Location: [facility];  Service: Endoscopy;  Laterality: N/A;  . ESOPHAGOGASTRODUODENOSCOPY N/A 03/23/2019   Procedure: ESOPHAGOGASTRODUODENOSCOPY (EGD);  Surgeon: Wilford Corner, MD;  Location: [facility];  Service: Endoscopy;  Laterality: N/A;  . ESOPHAGOGASTRODUODENOSCOPY (EGD) WITH PROPOFOL N/A 07/23/2019   Procedure: ESOPHAGOGASTRODUODENOSCOPY (EGD) WITH PROPOFOL with possible banding;  Surgeon: Wilford Corner, MD;  [facility];  Service: Endoscopy;  Laterality: N/A;  . ESOPHAGOGASTRODUODENOSCOPY (EGD) WITH PROPOFOL N/A 09/20/2019  Procedure: ESOPHAGOGASTRODUODENOSCOPY (EGD) WITH PROPOFOL;  Surgeon: Wilford Corner, MD;  [facility];  Service: Endoscopy;  Laterality: N/A;  . ESOPHAGOGASTRODUODENOSCOPY (EGD) WITH PROPOFOL N/A 12/21/2019   Procedure: ESOPHAGOGASTRODUODENOSCOPY (EGD) WITH PROPOFOL with possible banding;  Surgeon: Wilford Corner, MD;  [facility];  Service: Endoscopy;  Laterality: N/A;  . TUBAL LIGATION  Jul 25, 2000  . VENTRAL HERNIA REPAIR N/A 07/06/2020   Procedure: VENTRAL HERNIA REPAIR WITH MESH;  Surgeon: Coralie Keens, MD;  Location: [facility];  Service: General;  Laterality: N/A;   Social History   Occupational History  . Not on file  Tobacco Use  . Smoking status: Never Smoker  . Smokeless tobacco: Never Used  Vaping Use  . Vaping Use: Never used  Substance and Sexual Activity  . Alcohol use: Yes    Comment: rarely-last alcohol drank in 08/2018  . Drug use: No  . Sexual activity: Yes

## 2021-02-06 ENCOUNTER — Other Ambulatory Visit: Payer: Self-pay

## 2021-02-06 ENCOUNTER — Ambulatory Visit: Payer: 59 | Attending: Nurse Practitioner | Admitting: Nurse Practitioner

## 2021-02-06 DIAGNOSIS — K219 Gastro-esophageal reflux disease without esophagitis: Secondary | ICD-10-CM

## 2021-02-06 DIAGNOSIS — E119 Type 2 diabetes mellitus without complications: Secondary | ICD-10-CM

## 2021-02-06 DIAGNOSIS — R413 Other amnesia: Secondary | ICD-10-CM

## 2021-02-06 DIAGNOSIS — I10 Essential (primary) hypertension: Secondary | ICD-10-CM | POA: Diagnosis not present

## 2021-02-06 MED ORDER — PROPRANOLOL HCL 20 MG PO TABS: 20.0000 mg | ORAL_TABLET | Freq: Two times a day (BID) | ORAL | 1 refills | Status: DC

## 2021-02-06 MED ORDER — PANTOPRAZOLE SODIUM 40 MG PO TBEC: 40.0000 mg | DELAYED_RELEASE_TABLET | Freq: Every day | ORAL | 1 refills | Status: DC

## 2021-02-06 NOTE — Progress Notes (Signed)
Virtual Visit via Telephone Note Due to national recommendations of social distancing due to [place] 19, telehealth visit is felt to be most appropriate for this patient at this time.  I discussed the limitations, risks, security and privacy concerns of performing an evaluation and management service by telephone and the availability of in person appointments. I also discussed with the patient that there may be a patient responsible charge related to this service. The patient expressed understanding and agreed to proceed.    I connected with Angela Castillo on 02/06/21  at   4:10 PM EDT  EDT by telephone and verified that I am speaking with the correct person using two identifiers.   Consent I discussed the limitations, risks, security and privacy concerns of performing an evaluation and management service by telephone and the availability of in person appointments. I also discussed with the patient that there may be a patient responsible charge related to this service. The patient expressed understanding and agreed to proceed.   Location of Patient: Private Residence   Location of Provider: [place] and [employer] Office    Persons participating in Telemedicine visit: [name] FNP-BC [place]    History of Present Illness: Telemedicine visit for: Follow Up She has a past medical history of Anemia, DM2,  Elevated liver enzymes, HTN,  Fatty liver, H/O: upper GI bleed (03/2019), Liver cirrhosis secondary to NASH, Pneumonia (03/2019), Reflux, and Thrombocytopenia   Started with a "sour stomach" yesterday. Also endorses increased loose bowel movements. I have recommended she only take one metformin instead of 2. She can take 500 mg in the evening. At this time as A1c is well controlled Lab Results  Component Value Date   HGBA1C 5.5 06/13/2020   Stopped amlodipine 2.5 mg due to BLE edema. Will send readings of home BP readings via mychart.  She will continue on losartan-hctz 50-12.5 mg daily.  BP Readings from Last 3 Encounters:  11/07/20 (!) 143/87  10/31/20 (!) 159/110  10/26/20 132/80    She endorses intermittent leg cramps and pain which eventually subsided. I recommended she stop her birth control due to risk factors for DVT and leg cramps: weight and age.   Endorses Forgetfulness. Her children have Attention deficit disorders. She is also very tangential over the phone today. Recommended she be tested for ADD or ADHD.   Having right flank pain and pain in "my right kidney" which comes and goes. Also associated with odorous urine.   She also endorses break through bleeding with her birth control pills however it does not appear she is taking her birth control pills exactly as prescribed. Missing a dose here and there it seems based on how she tells me she is filling her weekly pill box. Instructed her that there will be a risk of breakthrough bleeding if pills are not taken as prescribed daily.    Past Medical History:  Diagnosis Date  . Anemia Dx January 03 2015  . Diabetes mellitus without complication ([place])   . Elevated liver enzymes   . Essential hypertension Dx Apr 2016  . Fatty liver   . H/O: upper GI bleed 03/2019    variceal bleed   . History of blood transfusion 03/2019  . Liver cirrhosis secondary to NASH ([place])   . Pneumonia 03/2019  . Reflux   . Thrombocytopenia ([place])     Past Surgical History:  Procedure Laterality Date  . ESOPHAGEAL BANDING  03/22/2019   Procedure: ESOPHAGEAL BANDING;  Surgeon: Wilford Corner, MD;  Location: [facility];  Service: Endoscopy;;  . ESOPHAGEAL BANDING  03/23/2019   Procedure: ESOPHAGEAL BANDING;  Surgeon: Wilford Corner, MD;  [facility];  Service: Endoscopy;;  . ESOPHAGEAL BANDING  09/20/2019   Procedure: ESOPHAGEAL BANDING;  Surgeon: Wilford Corner, MD;  [facility];  Service: Endoscopy;;  . ESOPHAGEAL BANDING N/A 12/21/2019   Procedure:  ESOPHAGEAL BANDING;  Surgeon: Wilford Corner, MD;  [facility];  Service: Endoscopy;  Laterality: N/A;  . ESOPHAGOGASTRODUODENOSCOPY N/A 03/22/2019   Procedure: ESOPHAGOGASTRODUODENOSCOPY (EGD);  Surgeon: Wilford Corner, MD;  Location: [facility];  Service: Endoscopy;  Laterality: N/A;  . ESOPHAGOGASTRODUODENOSCOPY N/A 03/23/2019   Procedure: ESOPHAGOGASTRODUODENOSCOPY (EGD);  Surgeon: Wilford Corner, MD;  Location: [facility];  Service: Endoscopy;  Laterality: N/A;  . ESOPHAGOGASTRODUODENOSCOPY (EGD) WITH PROPOFOL N/A 07/23/2019   Procedure: ESOPHAGOGASTRODUODENOSCOPY (EGD) WITH PROPOFOL with possible banding;  Surgeon: Wilford Corner, MD;  [facility];  Service: Endoscopy;  Laterality: N/A;  . ESOPHAGOGASTRODUODENOSCOPY (EGD) WITH PROPOFOL N/A 09/20/2019   Procedure: ESOPHAGOGASTRODUODENOSCOPY (EGD) WITH PROPOFOL;  Surgeon: Wilford Corner, MD;  [facility];  Service: Endoscopy;  Laterality: N/A;  . ESOPHAGOGASTRODUODENOSCOPY (EGD) WITH PROPOFOL N/A 12/21/2019   Procedure: ESOPHAGOGASTRODUODENOSCOPY (EGD) WITH PROPOFOL with possible banding;  Surgeon: Wilford Corner, MD;  [facility];  Service: Endoscopy;  Laterality: N/A;  . TUBAL LIGATION  Jul 25, 2000  . VENTRAL HERNIA REPAIR N/A 07/06/2020   Procedure: VENTRAL HERNIA REPAIR WITH MESH;  Surgeon: Coralie Keens, MD;  [facility];  Service: General;  Laterality: N/A;    Family History  Problem Relation Age of Onset  . COPD Mother   . Esophageal cancer Mother   . Diabetes Mother   . Cancer Mother   . Hypertension Mother   . Diabetes Father   . Breast cancer Maternal Aunt     Social History   Socioeconomic History  . Marital status: Married    Spouse name: Not on file  . Number of children: Not on file  . Years of education: Not on file  . Highest education level: Not on file  Occupational History  . Not on file  Tobacco Use  . Smoking status: Never Smoker  .  Smokeless tobacco: Never Used  Vaping Use  . Vaping Use: Never used  Substance and Sexual Activity  . Alcohol use: Yes    Comment: rarely-last alcohol drank in 08/2018  . Drug use: No  . Sexual activity: Yes  Other Topics Concern  . Not on file  Social History Narrative  . Not on file   Social Determinants of Health   Financial Resource Strain: Not on file  Food Insecurity: Not on file  Transportation Needs: Not on file  Physical Activity: Not on file  Stress: Not on file  Social Connections: Not on file     Observations/Objective: Awake, alert and oriented x 3   Review of Systems  Constitutional: Negative for fever, malaise/fatigue and weight loss.  HENT: Negative.  Negative for nosebleeds.   Eyes: Negative.  Negative for blurred vision, double vision and photophobia.  Respiratory: Negative.  Negative for cough and shortness of breath.   Cardiovascular: Positive for leg swelling. Negative for chest pain and palpitations.  Gastrointestinal: Positive for heartburn. Negative for nausea and vomiting.  Genitourinary: Positive for flank pain.       SEE HPI  Musculoskeletal: Negative for myalgias.  Neurological: Negative.  Negative for dizziness, focal weakness, seizures and headaches.  Psychiatric/Behavioral: Positive for  memory loss. Negative for suicidal ideas.       SEE HPI    Assessment and Plan: Diagnoses and all orders for this visit:  Type 2 diabetes mellitus without complication, without long-term current use of insulin ([place]) Continue blood sugar control as discussed in office today, low carbohydrate diet, and regular physical exercise as tolerated, 150 minutes per week (30 min each day, 5 days per week, or 50 min 3 days per week). Keep blood sugar logs with fasting goal of 90-130 mg/dl, post prandial (after you eat) less than 180.  For Hypoglycemia: BS <60 and Hyperglycemia BS >400; contact the clinic ASAP. Annual eye exams and foot exams are recommended.   GERD  without esophagitis -     pantoprazole (PROTONIX) 40 MG tablet; Take 1 tablet (40 mg total) by mouth daily. -     propranolol (INDERAL) 20 MG tablet; Take 1 tablet (20 mg total) by mouth 2 (two) times daily. INSTRUCTIONS: Avoid GERD Triggers: acidic, spicy or fried foods, caffeine, coffee, sodas,  alcohol and chocolate.   Short-term memory loss -     Ambulatory referral to Psychiatry  Essential hypertension Continue all antihypertensives as prescribed.  Remember to bring in your blood pressure log with you for your follow up appointment.  DASH/Mediterranean Diets are healthier choices for HTN.     Follow Up Instructions Return in about 3 months (around 05/08/2021).     I discussed the assessment and treatment plan with the patient. The patient was provided an opportunity to ask questions and all were answered. The patient agreed with the plan and demonstrated an understanding of the instructions.   The patient was advised to call back or seek an in-person evaluation if the symptoms worsen or if the condition fails to improve as anticipated.  I provided 28 minutes of non-face-to-face time during this encounter including median intraservice time, reviewing previous notes, labs, imaging, medications and explaining diagnosis and management.  Gildardo Pounds, FNP-BC

## 2021-02-07 ENCOUNTER — Encounter: Payer: Self-pay | Admitting: Nurse Practitioner

## 2021-02-08 ENCOUNTER — Encounter: Payer: Self-pay | Admitting: Nurse Practitioner

## 2021-02-08 ENCOUNTER — Other Ambulatory Visit: Payer: Self-pay | Admitting: Nurse Practitioner

## 2021-02-08 DIAGNOSIS — I10 Essential (primary) hypertension: Secondary | ICD-10-CM

## 2021-02-08 DIAGNOSIS — IMO0002 Reserved for concepts with insufficient information to code with codable children: Secondary | ICD-10-CM

## 2021-02-08 DIAGNOSIS — D696 Thrombocytopenia, unspecified: Secondary | ICD-10-CM

## 2021-02-08 DIAGNOSIS — R8290 Unspecified abnormal findings in urine: Secondary | ICD-10-CM

## 2021-02-08 DIAGNOSIS — E1165 Type 2 diabetes mellitus with hyperglycemia: Secondary | ICD-10-CM

## 2021-02-09 ENCOUNTER — Other Ambulatory Visit: Payer: Self-pay

## 2021-02-09 ENCOUNTER — Ambulatory Visit: Payer: 59 | Attending: Nurse Practitioner

## 2021-02-09 DIAGNOSIS — R8290 Unspecified abnormal findings in urine: Secondary | ICD-10-CM

## 2021-02-09 DIAGNOSIS — E1165 Type 2 diabetes mellitus with hyperglycemia: Secondary | ICD-10-CM

## 2021-02-09 DIAGNOSIS — IMO0002 Reserved for concepts with insufficient information to code with codable children: Secondary | ICD-10-CM

## 2021-02-09 DIAGNOSIS — D696 Thrombocytopenia, unspecified: Secondary | ICD-10-CM

## 2021-02-10 LAB — CBC
Hematocrit: 35.5 % (ref 34.0–46.6)
Hemoglobin: 11 g/dL — ABNORMAL LOW (ref 11.1–15.9)
MCH: 26.2 pg — ABNORMAL LOW (ref 26.6–33.0)
MCHC: 31 g/dL — ABNORMAL LOW (ref 31.5–35.7)
MCV: 85 fL (ref 79–97)
Platelets: 86 10*3/uL — CL (ref 150–450)
RBC: 4.2 x10E6/uL (ref 3.77–5.28)
RDW: 14.5 % (ref 11.7–15.4)
WBC: 5 10*3/uL (ref 3.4–10.8)

## 2021-02-10 LAB — URINALYSIS, COMPLETE
Bilirubin, UA: NEGATIVE
Ketones, UA: NEGATIVE
Nitrite, UA: POSITIVE — AB
Specific Gravity, UA: >=1.03 — AB (ref 1.005–1.030)
Urobilinogen, Ur: 0.2 mg/dL (ref 0.2–1.0)
pH, UA: 6 (ref 5.0–7.5)

## 2021-02-10 LAB — MICROSCOPIC EXAMINATION
Casts: NONE SEEN /lpf
RBC, Urine: >30 /hpf — AB (ref 0–2)

## 2021-02-10 LAB — HEMOGLOBIN A1C
Est. average glucose Bld gHb Est-mCnc: 134 mg/dL
Hgb A1c MFr Bld: 6.3 % — ABNORMAL HIGH (ref 4.8–5.6)

## 2021-02-10 LAB — LIPID PANEL
Chol/HDL Ratio: 6 ratio — ABNORMAL HIGH (ref 0.0–4.4)
Cholesterol, Total: 174 mg/dL (ref 100–199)
HDL: 29 mg/dL — ABNORMAL LOW (ref >39)
LDL Chol Calc (NIH): 81 mg/dL (ref 0–99)
Triglycerides: 393 mg/dL — ABNORMAL HIGH (ref 0–149)
VLDL Cholesterol Cal: 64 mg/dL — ABNORMAL HIGH (ref 5–40)

## 2021-02-11 ENCOUNTER — Encounter: Payer: Self-pay | Admitting: Nurse Practitioner

## 2021-02-12 ENCOUNTER — Other Ambulatory Visit: Payer: Self-pay | Admitting: Nurse Practitioner

## 2021-02-12 ENCOUNTER — Other Ambulatory Visit (HOSPITAL_COMMUNITY)
Admission: RE | Admit: 2021-02-12 | Discharge: 2021-02-12 | Disposition: A | Discharge status: Home / Self Care | Payer: 59 | Source: Ambulatory Visit | Attending: Nurse Practitioner | Admitting: Nurse Practitioner

## 2021-02-12 ENCOUNTER — Encounter: Payer: Self-pay | Admitting: Nurse Practitioner

## 2021-02-12 ENCOUNTER — Encounter: Payer: Self-pay | Admitting: Orthopaedic Surgery

## 2021-02-12 ENCOUNTER — Ambulatory Visit (INDEPENDENT_AMBULATORY_CARE_PROVIDER_SITE_OTHER): Payer: 59 | Admitting: Orthopaedic Surgery

## 2021-02-12 ENCOUNTER — Other Ambulatory Visit: Payer: Self-pay | Admitting: Family Medicine

## 2021-02-12 VITALS — Ht 63.5 in | Wt 247.0 lb

## 2021-02-12 DIAGNOSIS — M25561 Pain in right knee: Secondary | ICD-10-CM | POA: Diagnosis not present

## 2021-02-12 DIAGNOSIS — Z114 Encounter for screening for human immunodeficiency virus [HIV]: Secondary | ICD-10-CM

## 2021-02-12 DIAGNOSIS — M25562 Pain in left knee: Secondary | ICD-10-CM

## 2021-02-12 DIAGNOSIS — Z6841 Body Mass Index (BMI) 40.0 and over, adult: Secondary | ICD-10-CM | POA: Diagnosis not present

## 2021-02-12 DIAGNOSIS — G8929 Other chronic pain: Secondary | ICD-10-CM

## 2021-02-12 DIAGNOSIS — R8290 Unspecified abnormal findings in urine: Secondary | ICD-10-CM

## 2021-02-12 DIAGNOSIS — Z113 Encounter for screening for infections with a predominantly sexual mode of transmission: Secondary | ICD-10-CM | POA: Diagnosis not present

## 2021-02-12 MED ORDER — NITROFURANTOIN MONOHYD MACRO 100 MG PO CAPS: 100.0000 mg | ORAL_CAPSULE | Freq: Two times a day (BID) | ORAL | 0 refills | Status: AC

## 2021-02-12 NOTE — Progress Notes (Signed)
The patient is well-known to [place].  She is a 45 year old female who we just saw a few weeks ago with acute on chronic bilateral knee pain.  She had steroid injections in both knees.  She said they were greatly but then her right knee started to hurt a little bit yesterday and her left knee started to hurt a little bit today.  She says the pain is improved significantly compared to what she was a few weeks ago.  She has not been through any formal physical therapy on her knee.  She has been to 1 visit before but then had to stop this for hernia surgery.  She does have a BMI of 43.  She is never had surgery on her knees.  Examination of both knees today show that the have no effusion.  She has good range of motion of both knees and they hurt in the posterior aspect of them.  At this point I would like to try formal physical therapy to strengthen the muscles around the knee and any other modalities the therapist feel may help with her knees.  We will then see her back in 4 weeks.  If she is not having any improvement and still having posterior knee pain or develops locking catching then at that point MRIs will be obtained.  Her plain films were reviewed again today of both knees showing well-maintained joint space.  She agrees with this treatment plan.

## 2021-02-12 NOTE — Addendum Note (Signed)
Addended by: Lendon Collar on: 02/12/2021 04:17 PM   Modules accepted: Orders

## 2021-02-14 ENCOUNTER — Encounter: Payer: Self-pay | Admitting: Nurse Practitioner

## 2021-02-15 ENCOUNTER — Other Ambulatory Visit: Payer: Self-pay

## 2021-02-15 ENCOUNTER — Ambulatory Visit: Payer: 59 | Attending: Nurse Practitioner

## 2021-02-15 ENCOUNTER — Other Ambulatory Visit: Payer: Self-pay | Admitting: Nurse Practitioner

## 2021-02-15 ENCOUNTER — Encounter: Payer: Self-pay | Admitting: Nurse Practitioner

## 2021-02-15 DIAGNOSIS — Z114 Encounter for screening for human immunodeficiency virus [HIV]: Secondary | ICD-10-CM

## 2021-02-16 LAB — MOLECULAR ANCILLARY ONLY
Bacterial Vaginitis (gardnerella): NEGATIVE
Candida Glabrata: NEGATIVE
Candida Vaginitis: NEGATIVE
Chlamydia: NEGATIVE
Comment: NEGATIVE
Comment: NEGATIVE
Comment: NEGATIVE
Comment: NEGATIVE
Comment: NEGATIVE
Comment: NORMAL
Neisseria Gonorrhea: NEGATIVE
Trichomonas: NEGATIVE

## 2021-02-16 LAB — HIV ANTIBODY (ROUTINE TESTING W REFLEX): HIV Screen 4th Generation wRfx: NONREACTIVE

## 2021-03-02 ENCOUNTER — Other Ambulatory Visit: Payer: Self-pay | Admitting: Gastroenterology

## 2021-03-02 DIAGNOSIS — K7030 Alcoholic cirrhosis of liver without ascites: Secondary | ICD-10-CM

## 2021-03-06 ENCOUNTER — Ambulatory Visit (INDEPENDENT_AMBULATORY_CARE_PROVIDER_SITE_OTHER): Payer: 59 | Admitting: Physical Therapy

## 2021-03-06 ENCOUNTER — Encounter: Payer: Self-pay | Admitting: Physical Therapy

## 2021-03-06 ENCOUNTER — Other Ambulatory Visit: Payer: Self-pay

## 2021-03-06 DIAGNOSIS — R262 Difficulty in walking, not elsewhere classified: Secondary | ICD-10-CM

## 2021-03-06 DIAGNOSIS — M25562 Pain in left knee: Secondary | ICD-10-CM

## 2021-03-06 DIAGNOSIS — G8929 Other chronic pain: Secondary | ICD-10-CM

## 2021-03-06 DIAGNOSIS — M6281 Muscle weakness (generalized): Secondary | ICD-10-CM

## 2021-03-06 DIAGNOSIS — M25661 Stiffness of right knee, not elsewhere classified: Secondary | ICD-10-CM

## 2021-03-06 DIAGNOSIS — M25662 Stiffness of left knee, not elsewhere classified: Secondary | ICD-10-CM | POA: Diagnosis not present

## 2021-03-06 DIAGNOSIS — M25561 Pain in right knee: Secondary | ICD-10-CM | POA: Diagnosis not present

## 2021-03-06 NOTE — Therapy (Signed)
[facility] [address] [place], [place], [address] Phone: [phone]   Fax:  [phone]  Physical Therapy Evaluation  Patient Details  Name: Angela Castillo MRN: [id number] Date of Birth: 07-21-1976 Referring Provider (PT): Jean Rosenthal   Encounter Date: 03/06/2021   PT End of Session - 03/06/21 1524    Visit Number 1    Number of Visits 13    Date for PT Re-Evaluation 04/20/21    Progress Note Due on Visit 10    PT Start Time 5277    PT Stop Time 1555    PT Time Calculation (min) 40 min    Activity Tolerance Patient tolerated treatment well    Behavior During Therapy [facility] for tasks assessed/performed           Past Medical History:  Diagnosis Date  . Anemia Dx January 03 2015  . Diabetes mellitus without complication ([place])   . Elevated liver enzymes   . Essential hypertension Dx Apr 2016  . Fatty liver   . H/O: upper GI bleed 03/2019    variceal bleed   . History of blood transfusion 03/2019  . Liver cirrhosis secondary to NASH ([place])   . Pneumonia 03/2019  . Reflux   . Thrombocytopenia ([place])     Past Surgical History:  Procedure Laterality Date  . ESOPHAGEAL BANDING  03/22/2019   Procedure: ESOPHAGEAL BANDING;  Surgeon: Wilford Corner, MD;  Location: [facility];  Service: Endoscopy;;  . ESOPHAGEAL BANDING  03/23/2019   Procedure: ESOPHAGEAL BANDING;  Surgeon: Wilford Corner, MD;  [facility];  Service: Endoscopy;;  . ESOPHAGEAL BANDING  09/20/2019   Procedure: ESOPHAGEAL BANDING;  Surgeon: Wilford Corner, MD;  [facility];  Service: Endoscopy;;  . ESOPHAGEAL BANDING N/A 12/21/2019   Procedure: ESOPHAGEAL BANDING;  Surgeon: Wilford Corner, MD;  [facility];  Service: Endoscopy;  Laterality: N/A;  . ESOPHAGOGASTRODUODENOSCOPY N/A 03/22/2019   Procedure: ESOPHAGOGASTRODUODENOSCOPY (EGD);  Surgeon: Wilford Corner, MD;  [facility];  Service: Endoscopy;   Laterality: N/A;  . ESOPHAGOGASTRODUODENOSCOPY N/A 03/23/2019   Procedure: ESOPHAGOGASTRODUODENOSCOPY (EGD);  Surgeon: Wilford Corner, MD;  [facility];  Service: Endoscopy;  Laterality: N/A;  . ESOPHAGOGASTRODUODENOSCOPY (EGD) WITH PROPOFOL N/A 07/23/2019   Procedure: ESOPHAGOGASTRODUODENOSCOPY (EGD) WITH PROPOFOL with possible banding;  Surgeon: Wilford Corner, MD;  [facility];  Service: Endoscopy;  Laterality: N/A;  . ESOPHAGOGASTRODUODENOSCOPY (EGD) WITH PROPOFOL N/A 09/20/2019   Procedure: ESOPHAGOGASTRODUODENOSCOPY (EGD) WITH PROPOFOL;  Surgeon: Wilford Corner, MD;  [facility];  Service: Endoscopy;  Laterality: N/A;  . ESOPHAGOGASTRODUODENOSCOPY (EGD) WITH PROPOFOL N/A 12/21/2019   Procedure: ESOPHAGOGASTRODUODENOSCOPY (EGD) WITH PROPOFOL with possible banding;  Surgeon: Wilford Corner, MD;  [facility];  Service: Endoscopy;  Laterality: N/A;  . TUBAL LIGATION  Jul 25, 2000  . VENTRAL HERNIA REPAIR N/A 07/06/2020   Procedure: VENTRAL HERNIA REPAIR WITH MESH;  Surgeon: Coralie Keens, MD;  Location: [facility];  Service: General;  Laterality: N/A;    There were no vitals filed for this visit.    Subjective Assessment - 03/06/21 1513    Subjective Pt arriving to therapy reporting bilateral knee pain of 2/10. Pt reporting pain varies depending on activity levels. Pt reporting catching sensation in bilatera knees especially when bending her knees mainly on the right side. Pt s/p injections on 01/29/2021. Pt reporting relief following injections but now her pain is returning.    Pertinent History bilateral knee injections: 01/29/2021    Limitations Walking    How  long can you walk comfortably? 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 Yes    Pain Score 2     Pain Location Knee    Pain Orientation Left;Right     Pain Descriptors / Indicators Aching;Sore    Pain Type Chronic pain    Pain Onset More than a month ago    Pain Frequency Intermittent    Aggravating Factors  walking, standing long periods, bending    Pain Relieving Factors over the counter pain meds as needed, ice    Effect of Pain on Daily Activities house hold chores              [facility] PT Assessment - 03/06/21 0001      Assessment   Medical Diagnosis bilateral knee pain    Referring Provider (PT) Jean Rosenthal    Hand Dominance Right    Next MD Visit f/u in 4 weeks    Prior Therapy yes for hips years ago      Precautions   Precautions None      Restrictions   Weight Bearing Restrictions No      Balance Screen   Has the patient fallen in the past 6 months No    Is the patient reluctant to leave their home because of a fear of falling?  No      Home Environment   Living Environment Private residence    Living Arrangements Spouse/significant other;Children    Type of [place] Access Level entry      Prior Function   Level of Independence Independent    Vocation Full time employment    Vocation Requirements security guard    Leisure crochet      Cognition   Overall Cognitive Status Within Functional Limits for tasks assessed      Observation/Other Assessments   Focus on Therapeutic Outcomes (FOTO)  64 (predicted 71)      ROM / Strength   AROM / PROM / Strength AROM;Strength;PROM      AROM   AROM Assessment Site Knee    Right/Left Knee Right;Left    Right Knee Extension 0    Right Knee Flexion 115    Left Knee Extension 0    Left Knee Flexion 115      PROM   PROM Assessment Site Knee    Right/Left Knee Right;Left    Right Knee Extension 0    Right Knee Flexion 122    Left Knee Extension 0    Left Knee Flexion 118      Strength   Overall Strength Comments bilateral hips grossly 4+/5    Strength Assessment Site Knee    Right/Left Knee Right;Left    Right Knee Flexion 4/5     Right Knee Extension 5/5    Left Knee Flexion 4/5    Left Knee Extension 5/5      Palpation   Patella mobility WNL    Palpation comment TTP: right joint line, left distal quads      Special Tests    Special Tests Knee Special Tests    Other special tests no ligamental instibility      Transfers   Five time sit to stand comments  18 seconds no UE support  Objective measurements completed on examination: See above findings.       [facility] Adult PT Treatment/Exercise - 03/06/21 0001      Exercises   Exercises Knee/Hip      Knee/Hip Exercises: Seated   Sit to Sand 10 reps;without UE support      Knee/Hip Exercises: Supine   Bridges Strengthening;Both;10 reps    Bridges Limitations holding 3 seconds    Straight Leg Raises Strengthening;Both;5 reps      Knee/Hip Exercises: Sidelying   Hip ABduction Strengthening;Both;5 reps                  PT Education - 03/06/21 1521    Education Details PT POC, HEP    Person(s) Educated Patient    Methods Explanation;Demonstration;Handout;Tactile cues    Comprehension Returned demonstration;Verbalized understanding            PT Short Term Goals - 03/06/21 1546      PT SHORT TERM GOAL #1   Title pt will be independent in independent in initial HEP    Time 3    Period Weeks    Status New    Target Date 03/27/21      PT SHORT TERM GOAL #2   Title Pt will report pain of </= 5/10 during work shift.    Baseline currently pain varies    Time 3    Status New    Target Date 03/27/21      PT SHORT TERM GOAL #3   Title -      PT SHORT TERM GOAL #4   Title -             PT Long Term Goals - 03/06/21 1548      PT LONG TERM GOAL #1   Title Pt will be independent in advanced HEP    Time 6    Period Weeks    Status New    Target Date 04/20/21      PT LONG TERM GOAL #2   Title Pt will improve her FOTO to 71% function.    Time 6    Period Weeks    Status New    Target Date  04/20/21      PT LONG TERM GOAL #3   Title Pt will improve bilateral knee flexion to 5/5 for increaed functional mobility.    Time 6    Period Weeks    Status New    Target Date 04/20/21      PT LONG TERM GOAL #4   Title Pt will be able to perform sit to stand in </= 14 seconds with no UE support with no pain reported.    Baseline 18 seocnds with bilateral knee pain reported.    Time 6    Period Weeks    Status New    Target Date 04/20/21                  Plan - 03/06/21 1552    Clinical Impression Statement Pt is a 45 year old female arriving to threapy complaining of chronic bilateral knee pain. Pt s/p bilateral knee injections on 01/29/2021. Pt reporting improvements but her pain is returning. Pt presenting with mild limitations in knee flexion strength and AROM limited to 115 degrees bilaterally. Pt reporting pain with walking prolonged and when working. Pt taking over the counter pain meds as needed. Skilled PT needed to address pt's impairments with the below interventions.    Personal Factors and Comorbidities  Comorbidity 3+    Comorbidities DM, HTN, obesity, h/p pneumonia, fatty liver, GI bleed, thrombocytopenia, h/o hip pain    Examination-Activity Limitations Lift;Stairs;Squat;Stand;Other    Examination-Participation Restrictions Community Activity;Other;Occupation    Stability/Clinical Decision Making Stable/Uncomplicated    Clinical Decision Making Low    Rehab Potential Good    PT Frequency 2x / week    PT Duration 6 weeks    PT Treatment/Interventions ADLs/Self Care Home Management;Electrical Stimulation;Cryotherapy;Iontophoresis 40m/ml Dexamethasone;Moist Heat;Ultrasound;Gait training;Stair training;Functional mobility training;Therapeutic activities;Therapeutic exercise;Balance training;Neuromuscular re-education;Patient/family education;Manual techniques;Passive range of motion;Dry needling;Taping    PT Next Visit Plan bike vs Nustep, hamstring stretching,  strengtheing, quad control, dynamic balance    PT Home Exercise Plan Access Code: [email or web address]    Consulted and Agree with Plan of Care Patient           Patient will benefit from skilled therapeutic intervention in order to improve the following deficits and impairments:  Pain,Postural dysfunction,Obesity,Decreased balance,Increased edema,Decreased activity tolerance,Decreased endurance,Decreased strength,Decreased range of motion,Difficulty walking  Visit Diagnosis: Chronic pain of right knee  Acute pain of left knee  Stiffness of right knee, not elsewhere classified  Stiffness of left knee, not elsewhere classified  Muscle weakness (generalized)  Difficulty in walking, not elsewhere classified     Problem List Patient Active Problem List   Diagnosis Date Noted  . Ingrown toenail 10/16/2019  . Cirrhosis ([place] 07/23/2019  . Esophageal varices ([place] 07/23/2019  . Diabetes mellitus ([place] 04/20/2019  . Acute respiratory insufficiency   . Respiratory failure ([place]   . Shock circulatory ([place]   . H/O: upper GI bleed 03/21/2019  . Impingement syndrome of left shoulder 04/21/2017  . Low back pain 03/18/2016  . Left shoulder pain 03/18/2016  . Esophageal reflux 03/18/2016  . Lateral pain of left hip 10/25/2015  . Heel pain, bilateral 05/03/2015  . Obesity (BMI 30-39.9) 05/03/2015  . Hearing loss in right ear 03/01/2015  . Family history of diabetes mellitus in mother 03/01/2015  . Essential hypertension 01/18/2015  . Anemia, iron deficiency 01/04/2015  . Musculoskeletal chest pain 01/04/2015  . Thrombocytopenia ([place] 01/04/2015  . Transaminitis 01/03/2015    JOretha Caprice PT, MPT 03/06/2021, 4:04 PM  [facility]Physical Therapy [address] [address]Phone: [phone]  Fax:  [phone] Name: MTameah MihalkoMRN: [id number] of Birth: [birth year]

## 2021-03-06 NOTE — Patient Instructions (Signed)
Access Code: [email or web address] URL: [email or web address] Date: 03/06/2021 Prepared by: Kearney Hard  Exercises Supine Bridge - 2 x daily - 7 x weekly - 2 sets - 10 reps - 3-5 seconds hold Sidelying Hip Abduction - 2 x daily - 7 x weekly - 2 sets - 10 reps Sit to Stand Without Arm Support - 2 x daily - 7 x weekly - 2 sets - 10 reps Supine Active Straight Leg Raise - 2 x daily - 7 x weekly - 2 sets - 10 reps

## 2021-03-11 ENCOUNTER — Encounter: Payer: Self-pay | Admitting: Nurse Practitioner

## 2021-03-12 ENCOUNTER — Other Ambulatory Visit: Payer: Self-pay | Admitting: Nurse Practitioner

## 2021-03-12 ENCOUNTER — Ambulatory Visit (INDEPENDENT_AMBULATORY_CARE_PROVIDER_SITE_OTHER): Payer: 59 | Admitting: Orthopaedic Surgery

## 2021-03-12 ENCOUNTER — Encounter: Payer: Self-pay | Admitting: Orthopaedic Surgery

## 2021-03-12 ENCOUNTER — Other Ambulatory Visit: Payer: Self-pay

## 2021-03-12 DIAGNOSIS — G8929 Other chronic pain: Secondary | ICD-10-CM | POA: Diagnosis not present

## 2021-03-12 DIAGNOSIS — M25562 Pain in left knee: Secondary | ICD-10-CM | POA: Diagnosis not present

## 2021-03-12 DIAGNOSIS — M25561 Pain in right knee: Secondary | ICD-10-CM

## 2021-03-12 DIAGNOSIS — K219 Gastro-esophageal reflux disease without esophagitis: Secondary | ICD-10-CM

## 2021-03-12 MED ORDER — PROPRANOLOL HCL 20 MG PO TABS: 20.0000 mg | ORAL_TABLET | Freq: Two times a day (BID) | ORAL | 1 refills | Status: DC

## 2021-03-12 NOTE — Progress Notes (Signed)
The patient is a 44 year old female that we have been seeing for over 6 months now for her knees.  Both knees swell on occasion have locking and catching.  She has had several steroid injections in both knees.  She has been to physical therapy to work on quad strengthening exercises.  She has a BMI of 43 and is worked on weight loss.  Both knees still swell on occasion and do have locking catching still in spite of conservative treatment.  She has had a history of GI bleed so she cannot take anti-inflammatories.  On exam neither knee has an effusion today but both knees have posterior medial and some posterior lateral tenderness exhibited with palpation as well as flexion extension.  Both knees have medial joint line tenderness and slight varus malalignment.  The plain films again reviewed of both knees and still show well-maintained medial lateral compartments.  At this point given the failure of all forms conservative treatment, I MRI of both knees is warranted to rule out a meniscal tear.  I still advocated weight loss and continued therapy with quad training exercises but this will be a neck step for her.  She agrees with this treatment plan.  We will see her in follow-up after the MRIs of both knees are obtained.  This is to rule out meniscal tears and to assess the cartilage.

## 2021-03-19 ENCOUNTER — Other Ambulatory Visit: Payer: Self-pay

## 2021-03-19 ENCOUNTER — Ambulatory Visit (INDEPENDENT_AMBULATORY_CARE_PROVIDER_SITE_OTHER): Payer: 59 | Admitting: Physical Therapy

## 2021-03-19 DIAGNOSIS — M25662 Stiffness of left knee, not elsewhere classified: Secondary | ICD-10-CM

## 2021-03-19 DIAGNOSIS — M25661 Stiffness of right knee, not elsewhere classified: Secondary | ICD-10-CM | POA: Diagnosis not present

## 2021-03-19 DIAGNOSIS — R262 Difficulty in walking, not elsewhere classified: Secondary | ICD-10-CM

## 2021-03-19 DIAGNOSIS — M25562 Pain in left knee: Secondary | ICD-10-CM

## 2021-03-19 DIAGNOSIS — M25561 Pain in right knee: Secondary | ICD-10-CM | POA: Diagnosis not present

## 2021-03-19 DIAGNOSIS — M6281 Muscle weakness (generalized): Secondary | ICD-10-CM

## 2021-03-19 DIAGNOSIS — G8929 Other chronic pain: Secondary | ICD-10-CM

## 2021-03-19 NOTE — Therapy (Signed)
[facility] Physical Therapy [address] [place], [place], [address] Phone: [phone]   Fax:  [phone]  Physical Therapy Treatment  Patient Details  Name: Angela Castillo MRN: [id number] Date of Birth: Dec 12, 1975 Referring Provider (PT): Jean Rosenthal   Encounter Date: 03/19/2021   PT End of Session - 03/19/21 1611    Visit Number 2    Number of Visits 13    Date for PT Re-Evaluation 04/20/21    Progress Note Due on Visit 10    PT Start Time 6945    PT Stop Time 1602    PT Time Calculation (min) 47 min    Activity Tolerance Patient tolerated treatment well    Behavior During Therapy [facility] for tasks assessed/performed           Past Medical History:  Diagnosis Date  . Anemia Dx January 03 2015  . Diabetes mellitus without complication ([place])   . Elevated liver enzymes   . Essential hypertension Dx Apr 2016  . Fatty liver   . H/O: upper GI bleed 03/2019    variceal bleed   . History of blood transfusion 03/2019  . Liver cirrhosis secondary to NASH ([place])   . Pneumonia 03/2019  . Reflux   . Thrombocytopenia ([place])     Past Surgical History:  Procedure Laterality Date  . ESOPHAGEAL BANDING  03/22/2019   Procedure: ESOPHAGEAL BANDING;  Surgeon: Wilford Corner, MD;  [facility];  Service: Endoscopy;;  . ESOPHAGEAL BANDING  03/23/2019   Procedure: ESOPHAGEAL BANDING;  Surgeon: Wilford Corner, MD;  [facility];  Service: Endoscopy;;  . ESOPHAGEAL BANDING  09/20/2019   Procedure: ESOPHAGEAL BANDING;  Surgeon: Wilford Corner, MD;  [facility];  Service: Endoscopy;;  . ESOPHAGEAL BANDING N/A 12/21/2019   Procedure: ESOPHAGEAL BANDING;  Surgeon: Wilford Corner, MD;  [facility];  Service: Endoscopy;  Laterality: N/A;  . ESOPHAGOGASTRODUODENOSCOPY N/A 03/22/2019   Procedure: ESOPHAGOGASTRODUODENOSCOPY (EGD);  Surgeon: Wilford Corner, MD;  [facility];  Service: Endoscopy;   Laterality: N/A;  . ESOPHAGOGASTRODUODENOSCOPY N/A 03/23/2019   Procedure: ESOPHAGOGASTRODUODENOSCOPY (EGD);  Surgeon: Wilford Corner, MD;  [facility];  Service: Endoscopy;  Laterality: N/A;  . ESOPHAGOGASTRODUODENOSCOPY (EGD) WITH PROPOFOL N/A 07/23/2019   Procedure: ESOPHAGOGASTRODUODENOSCOPY (EGD) WITH PROPOFOL with possible banding;  Surgeon: Wilford Corner, MD;  [facility];  Service: Endoscopy;  Laterality: N/A;  . ESOPHAGOGASTRODUODENOSCOPY (EGD) WITH PROPOFOL N/A 09/20/2019   Procedure: ESOPHAGOGASTRODUODENOSCOPY (EGD) WITH PROPOFOL;  Surgeon: Wilford Corner, MD;  [facility];  Service: Endoscopy;  Laterality: N/A;  . ESOPHAGOGASTRODUODENOSCOPY (EGD) WITH PROPOFOL N/A 12/21/2019   Procedure: ESOPHAGOGASTRODUODENOSCOPY (EGD) WITH PROPOFOL with possible banding;  Surgeon: Wilford Corner, MD;  [facility];  Service: Endoscopy;  Laterality: N/A;  . TUBAL LIGATION  Jul 25, 2000  . VENTRAL HERNIA REPAIR N/A 07/06/2020   Procedure: VENTRAL HERNIA REPAIR WITH MESH;  Surgeon: Coralie Keens, MD;  Location: [facility];  Service: General;  Laterality: N/A;    There were no vitals filed for this visit.   Subjective Assessment - 03/19/21 1520    Subjective Pt arriving to therapy reporting bilateral knee pain of 2/10 overall so the pain is no better but not worse since she was last in for PT. Also the MD has ordered MRI of her knees to investigate meniscal pathology.    Pertinent History bilateral knee injections: 01/29/2021    Limitations Walking    How long can you walk comfortably? 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    Hamstring Curl Both;20 reps    Hamstring Limitations green      Knee/Hip Exercises: Supine   Bridges Strengthening;Both;15 reps    Bridges Limitations holding 3 seconds    Straight Leg Raises Strengthening;Both;10 reps      Knee/Hip Exercises: Sidelying   Hip ABduction Both;10 reps      Manual Therapy   Manual Therapy Taping    Manual therapy comments KT tape for anterior knee support 2 vertical I strips ribbon pattern, then one horizontal I strip at joint line                    PT Short Term Goals - 03/06/21 1546      PT SHORT TERM GOAL #1   Title pt will be independent in independent in initial HEP    Time 3    Period Weeks    Status New    Target Date 03/27/21      PT SHORT TERM GOAL #2   Title Pt will report pain of </= 5/10 during work shift.    Baseline currently pain varies    Time 3    Status New    Target Date 03/27/21      PT SHORT TERM GOAL #3   Title -      PT SHORT TERM GOAL #4   Title -             PT Long Term Goals - 03/06/21 1548      PT LONG TERM GOAL #1   Title Pt will be independent in advanced HEP    Time 6    Period Weeks    Status New    Target Date 04/20/21      PT LONG TERM GOAL #2   Title Pt will improve her FOTO to 71% function.    Time 6    Period Weeks    Status New    Target Date 04/20/21      PT LONG TERM GOAL #3   Title Pt will improve bilateral knee flexion to 5/5 for increaed functional mobility.    Time 6    Period Weeks    Status New    Target Date 04/20/21      PT LONG TERM GOAL #4   Title Pt will be able to perform sit to stand in </= 14 seconds with no UE support with no pain reported.    Baseline 18  seocnds with bilateral knee pain reported.    Time 6    Period Weeks    Status New    Target Date 04/20/21  Plan - 03/19/21 1613    Clinical Impression Statement Worked to improve overall hip/knee ROM and strength to her tolerance, She does fatigue easily. Trialed KT tape to see if this reduces pain with activity.    Personal Factors and Comorbidities Comorbidity 3+    Comorbidities DM, HTN, obesity, h/p pneumonia, fatty liver, GI bleed, thrombocytopenia, h/o hip pain    Examination-Activity Limitations Lift;Stairs;Squat;Stand;Other    Examination-Participation Restrictions Community Activity;Other;Occupation    Stability/Clinical Decision Making Stable/Uncomplicated    Rehab Potential Good    PT Frequency 2x / week    PT Duration 6 weeks    PT Treatment/Interventions ADLs/Self Care Home Management;Electrical Stimulation;Cryotherapy;Iontophoresis 64m/ml Dexamethasone;Moist Heat;Ultrasound;Gait training;Stair training;Functional mobility training;Therapeutic activities;Therapeutic exercise;Balance training;Neuromuscular re-education;Patient/family education;Manual techniques;Passive range of motion;Dry needling;Taping    PT Next Visit Plan how was KT tape? bike vs Nustep, hamstring stretching, strengtheing, quad control, dynamic balance    PT Home Exercise Plan Access Code: [email or web address]    Consulted and Agree with Plan of Care Patient           Patient will benefit from skilled therapeutic intervention in order to improve the following deficits and impairments:  Pain,Postural dysfunction,Obesity,Decreased balance,Increased edema,Decreased activity tolerance,Decreased endurance,Decreased strength,Decreased range of motion,Difficulty walking  Visit Diagnosis: Chronic pain of right knee  Acute pain of left knee  Stiffness of right knee, not elsewhere classified  Stiffness of left knee, not elsewhere classified  Muscle weakness (generalized)  Difficulty in  walking, not elsewhere classified     Problem List Patient Active Problem List   Diagnosis Date Noted  . Ingrown toenail 10/16/2019  . Cirrhosis ([place] 07/23/2019  . Esophageal varices ([place] 07/23/2019  . Diabetes mellitus ([place] 04/20/2019  . Acute respiratory insufficiency   . Respiratory failure ([place]   . Shock circulatory ([place]   . H/O: upper GI bleed 03/21/2019  . Impingement syndrome of left shoulder 04/21/2017  . Low back pain 03/18/2016  . Left shoulder pain 03/18/2016  . Esophageal reflux 03/18/2016  . Lateral pain of left hip 10/25/2015  . Heel pain, bilateral 05/03/2015  . Obesity (BMI 30-39.9) 05/03/2015  . Hearing loss in right ear 03/01/2015  . Family history of diabetes mellitus in mother 03/01/2015  . Essential hypertension 01/18/2015  . Anemia, iron deficiency 01/04/2015  . Musculoskeletal chest pain 01/04/2015  . Thrombocytopenia ([place] 01/04/2015  . Transaminitis 01/03/2015    BSilvestre Mesi5/16/2022, 4:17 PM  [facility] [address] [address]Phone: [phone]  Fax:  [phone] Name: Angela BojarskiMRN: [id number] of Birth: [birth year]

## 2021-03-23 ENCOUNTER — Ambulatory Visit (INDEPENDENT_AMBULATORY_CARE_PROVIDER_SITE_OTHER): Payer: 59 | Admitting: Rehabilitative and Restorative Service Providers"

## 2021-03-23 ENCOUNTER — Encounter: Payer: Self-pay | Admitting: Rehabilitative and Restorative Service Providers"

## 2021-03-23 ENCOUNTER — Other Ambulatory Visit: Payer: Self-pay

## 2021-03-23 DIAGNOSIS — M25662 Stiffness of left knee, not elsewhere classified: Secondary | ICD-10-CM | POA: Diagnosis not present

## 2021-03-23 DIAGNOSIS — M25562 Pain in left knee: Secondary | ICD-10-CM

## 2021-03-23 DIAGNOSIS — M25661 Stiffness of right knee, not elsewhere classified: Secondary | ICD-10-CM | POA: Diagnosis not present

## 2021-03-23 DIAGNOSIS — R262 Difficulty in walking, not elsewhere classified: Secondary | ICD-10-CM

## 2021-03-23 DIAGNOSIS — M6281 Muscle weakness (generalized): Secondary | ICD-10-CM | POA: Diagnosis not present

## 2021-03-23 DIAGNOSIS — G8929 Other chronic pain: Secondary | ICD-10-CM

## 2021-03-23 DIAGNOSIS — M25561 Pain in right knee: Secondary | ICD-10-CM

## 2021-03-23 NOTE — Therapy (Signed)
[facility] Physical Therapy [address] [place], [place], [address] Phone: [phone]   Fax:  [phone]  Physical Therapy Treatment  Patient Details  Name: Angela Castillo MRN: [id number] Date of Birth: 1976/06/23 Referring Provider (PT): Jean Rosenthal   Encounter Date: 03/23/2021   PT End of Session - 03/23/21 1556    Visit Number 3    Number of Visits 13    Date for PT Re-Evaluation 04/20/21    Progress Note Due on Visit 10    PT Start Time 8676    PT Stop Time 1552    PT Time Calculation (min) 40 min    Activity Tolerance Patient tolerated treatment well;No increased pain;Patient limited by fatigue    Behavior During Therapy [facility] for tasks assessed/performed           Past Medical History:  Diagnosis Date  . Anemia Dx January 03 2015  . Diabetes mellitus without complication ([place])   . Elevated liver enzymes   . Essential hypertension Dx Apr 2016  . Fatty liver   . H/O: upper GI bleed 03/2019    variceal bleed   . History of blood transfusion 03/2019  . Liver cirrhosis secondary to NASH ([place])   . Pneumonia 03/2019  . Reflux   . Thrombocytopenia ([place])     Past Surgical History:  Procedure Laterality Date  . ESOPHAGEAL BANDING  03/22/2019   Procedure: ESOPHAGEAL BANDING;  Surgeon: Wilford Corner, MD;  [facility];  Service: Endoscopy;;  . ESOPHAGEAL BANDING  03/23/2019   Procedure: ESOPHAGEAL BANDING;  Surgeon: Wilford Corner, MD;  [facility];  Service: Endoscopy;;  . ESOPHAGEAL BANDING  09/20/2019   Procedure: ESOPHAGEAL BANDING;  Surgeon: Wilford Corner, MD;  [facility];  Service: Endoscopy;;  . ESOPHAGEAL BANDING N/A 12/21/2019   Procedure: ESOPHAGEAL BANDING;  Surgeon: Wilford Corner, MD;  [facility];  Service: Endoscopy;  Laterality: N/A;  . ESOPHAGOGASTRODUODENOSCOPY N/A 03/22/2019   Procedure: ESOPHAGOGASTRODUODENOSCOPY (EGD);  Surgeon: Wilford Corner, MD;   [facility];  Service: Endoscopy;  Laterality: N/A;  . ESOPHAGOGASTRODUODENOSCOPY N/A 03/23/2019   Procedure: ESOPHAGOGASTRODUODENOSCOPY (EGD);  Surgeon: Wilford Corner, MD;  [facility];  Service: Endoscopy;  Laterality: N/A;  . ESOPHAGOGASTRODUODENOSCOPY (EGD) WITH PROPOFOL N/A 07/23/2019   Procedure: ESOPHAGOGASTRODUODENOSCOPY (EGD) WITH PROPOFOL with possible banding;  Surgeon: Wilford Corner, MD;  [facility];  Service: Endoscopy;  Laterality: N/A;  . ESOPHAGOGASTRODUODENOSCOPY (EGD) WITH PROPOFOL N/A 09/20/2019   Procedure: ESOPHAGOGASTRODUODENOSCOPY (EGD) WITH PROPOFOL;  Surgeon: Wilford Corner, MD;  [facility];  Service: Endoscopy;  Laterality: N/A;  . ESOPHAGOGASTRODUODENOSCOPY (EGD) WITH PROPOFOL N/A 12/21/2019   Procedure: ESOPHAGOGASTRODUODENOSCOPY (EGD) WITH PROPOFOL with possible banding;  Surgeon: Wilford Corner, MD;  [facility];  Service: Endoscopy;  Laterality: N/A;  . TUBAL LIGATION  Jul 25, 2000  . VENTRAL HERNIA REPAIR N/A 07/06/2020   Procedure: VENTRAL HERNIA REPAIR WITH MESH;  Surgeon: Coralie Keens, MD;  Location: [place];  Service: General;  Laterality: N/A;    There were no vitals filed for this visit.   Subjective Assessment - 03/23/21 1520    Subjective Angela Castillo reports inconsistent HEP compliance.    Pertinent History bilateral knee injections: 01/29/2021    Limitations Walking    How long can you walk comfortably? 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 Yes  Pain Score 3     Pain Location Knee    Pain Orientation Right;Left    Pain Descriptors / Indicators Aching;Sore;Sharp    Pain Type Chronic pain    Pain Onset More than a month ago    Pain Frequency Constant    Aggravating Factors  Prolonged WB or prolonged sitting    Pain Relieving Factors Tylenol    Effect  of Pain on Daily Activities Limits endurance with all WB function    Multiple Pain Sites No                             [facility] Adult PT Treatment/Exercise - 03/23/21 0001      Exercises   Exercises Knee/Hip      Knee/Hip Exercises: Stretches   Knee: Self-Stretch Limitations Tailgate knee flexion AROM 3 minutes      Knee/Hip Exercises: Aerobic   Recumbent Bike Seat 5 Level 3 for 8 minutes      Knee/Hip Exercises: Machines for Strengthening   Total Gym Leg Press 100# full extension (avoid hyperextension to slow full flexion) 15X 2 sets      Knee/Hip Exercises: Seated   Long Arc Quad Strengthening;3 sets;5 reps;Limitations    Long Arc Quad Limitations Seated straight leg raises    Sit to [employer] 2 sets;5 reps;without UE support      Knee/Hip Exercises: Sidelying   Hip ABduction Strengthening;Both;2 sets;10 reps                  PT Education - 03/23/21 1524    Education Details Reviewed HEP with emphasis on seated SLR, sit to stand and sidelie hip abduction.    Person(s) Educated Patient    Methods Explanation;Demonstration;Verbal cues    Comprehension Verbalized understanding;Returned demonstration;Need further instruction            PT Short Term Goals - 03/23/21 1555      PT SHORT TERM GOAL #1   Title pt will be independent in independent in initial HEP    Time 3    Period Weeks    Status On-going    Target Date 03/27/21      PT SHORT TERM GOAL #2   Title Pt will report pain of </= 5/10 during work shift.    Baseline currently pain varies    Time 3    Status On-going    Target Date 03/27/21      PT SHORT TERM GOAL #3   Title -      PT SHORT TERM GOAL #4   Title -             PT Long Term Goals - 03/23/21 1556      PT LONG TERM GOAL #1   Title Pt will be independent in advanced HEP    Time 6    Period Weeks    Status On-going      PT LONG TERM GOAL #2   Title Pt will improve her FOTO to 71% function.    Time 6    Period  Weeks    Status On-going      PT LONG TERM GOAL #3   Title Pt will improve bilateral knee flexion to 5/5 for increaed functional mobility.    Time 6    Period Weeks    Status On-going      PT LONG TERM GOAL #4   Title Pt will be able to perform sit  to stand in </= 14 seconds with no UE support with no pain reported.    Baseline 18 seocnds with bilateral knee pain reported.    Time 6    Period Weeks    Status On-going                 Plan - 03/23/21 1557    Clinical Impression Statement Angela Castillo has poor quadriceps strength and tends to hyper-extend both knees when fatigued in standing.  Emphasized quadriceps strengthening in the clinic and at home with special awareness on avoiding knee hyperextension.    Personal Factors and Comorbidities Comorbidity 3+    Comorbidities DM, HTN, obesity, h/p pneumonia, fatty liver, GI bleed, thrombocytopenia, h/o hip pain    Examination-Activity Limitations Lift;Stairs;Squat;Stand;Other    Examination-Participation Restrictions Community Activity;Other;Occupation    Stability/Clinical Decision Making Stable/Uncomplicated    Rehab Potential Good    PT Frequency 2x / week    PT Duration 6 weeks    PT Treatment/Interventions ADLs/Self Care Home Management;Electrical Stimulation;Cryotherapy;Iontophoresis 19m/ml Dexamethasone;Moist Heat;Ultrasound;Gait training;Stair training;Functional mobility training;Therapeutic activities;Therapeutic exercise;Balance training;Neuromuscular re-education;Patient/family education;Manual techniques;Passive range of motion;Dry needling;Taping    PT Next Visit Plan how was KT tape? bike vs Nustep, hamstring stretching, strengtheing, quad control, dynamic balance    PT Home Exercise Plan Access Code: [email or web address]    Consulted and Agree with Plan of Care Patient           Patient will benefit from skilled therapeutic intervention in order to improve the following deficits and impairments:  Pain,Postural  dysfunction,Obesity,Decreased balance,Increased edema,Decreased activity tolerance,Decreased endurance,Decreased strength,Decreased range of motion,Difficulty walking  Visit Diagnosis: Difficulty in walking, not elsewhere classified  Muscle weakness (generalized)  Stiffness of left knee, not elsewhere classified  Stiffness of right knee, not elsewhere classified  Acute pain of left knee  Chronic pain of right knee     Problem List Patient Active Problem List   Diagnosis Date Noted  . Ingrown toenail 10/16/2019  . Cirrhosis ([place] 07/23/2019  . Esophageal varices ([place] 07/23/2019  . Diabetes mellitus ([place] 04/20/2019  . Acute respiratory insufficiency   . Respiratory failure ([place]   . Shock circulatory ([place]   . H/O: upper GI bleed 03/21/2019  . Impingement syndrome of left shoulder 04/21/2017  . Low back pain 03/18/2016  . Left shoulder pain 03/18/2016  . Esophageal reflux 03/18/2016  . Lateral pain of left hip 10/25/2015  . Heel pain, bilateral 05/03/2015  . Obesity (BMI 30-39.9) 05/03/2015  . Hearing loss in right ear 03/01/2015  . Family history of diabetes mellitus in mother 03/01/2015  . Essential hypertension 01/18/2015  . Anemia, iron deficiency 01/04/2015  . Musculoskeletal chest pain 01/04/2015  . [place]rombocytopenia ([place] 01/04/2015  . Transaminitis 01/03/2015    RFarley LyPT, MPT 03/23/2021, 3:58 PM  [facility] [address] [address]Phone: [phone]  Fax:  [phone] Name: MDarenda FikeMRN: [id number] of Birth: 107/30/1977

## 2021-03-25 ENCOUNTER — Other Ambulatory Visit: Payer: Self-pay | Admitting: Family Medicine

## 2021-03-25 DIAGNOSIS — R519 Headache, unspecified: Secondary | ICD-10-CM

## 2021-03-25 NOTE — Telephone Encounter (Signed)
Requested Prescriptions  Pending Prescriptions Disp Refills  . amitriptyline (ELAVIL) 25 MG tablet [Pharmacy Med Name: AMITRIPTYLINE 25MG TABLETS] 90 tablet 0    Sig: TAKE 1 TABLET(25 MG) BY MOUTH AT BEDTIME     Psychiatry:  Antidepressants - Heterocyclics (TCAs) Passed - 03/25/2021  3:12 PM      Passed - Valid encounter within last 6 months    Recent Outpatient Visits          1 month ago Type 2 diabetes mellitus without complication, without long-term current use of insulin ([place])   [place], [place] W, NP   4 months ago Diabetes mellitus type 2, uncontrolled, with complications [facility])   [place], Vernia Buff, NP   5 months ago Essential hypertension   [place], Jarome Matin, RPH-CPP   9 months ago Essential hypertension   [place], Vernia Buff, NP   10 months ago Essential hypertension   [place], Jarome Matin, RPH-CPP      Future Appointments            In 1 week Ninfa Linden Lind Guest, MD [facility]   In 1 month Gildardo Pounds, NP [place]

## 2021-03-26 ENCOUNTER — Ambulatory Visit (INDEPENDENT_AMBULATORY_CARE_PROVIDER_SITE_OTHER): Payer: 59 | Admitting: Physical Therapy

## 2021-03-26 ENCOUNTER — Other Ambulatory Visit: Payer: Self-pay

## 2021-03-26 DIAGNOSIS — R262 Difficulty in walking, not elsewhere classified: Secondary | ICD-10-CM

## 2021-03-26 DIAGNOSIS — M6281 Muscle weakness (generalized): Secondary | ICD-10-CM

## 2021-03-26 DIAGNOSIS — M25662 Stiffness of left knee, not elsewhere classified: Secondary | ICD-10-CM | POA: Diagnosis not present

## 2021-03-26 DIAGNOSIS — M25661 Stiffness of right knee, not elsewhere classified: Secondary | ICD-10-CM

## 2021-03-26 DIAGNOSIS — M25562 Pain in left knee: Secondary | ICD-10-CM

## 2021-03-26 DIAGNOSIS — G8929 Other chronic pain: Secondary | ICD-10-CM

## 2021-03-26 DIAGNOSIS — M25561 Pain in right knee: Secondary | ICD-10-CM

## 2021-03-26 NOTE — Therapy (Signed)
[facility] Physical Therapy [address] [place], [place], [address] Phone: [phone]   Fax:  [phone]  Physical Therapy Treatment  Patient Details  Name: Angela Castillo MRN: [id number] Date of Birth: 28-Mar-1976 Referring Provider (PT): Jean Rosenthal   Encounter Date: 03/26/2021   PT End of Session - 03/26/21 1552    Visit Number 4    Number of Visits 13    Date for PT Re-Evaluation 04/20/21    Progress Note Due on Visit 10    PT Start Time 8916    PT Stop Time 1553    PT Time Calculation (min) 39 min    Activity Tolerance Patient tolerated treatment well;No increased pain;Patient limited by fatigue    Behavior During Therapy [facility] for tasks assessed/performed           Past Medical History:  Diagnosis Date  . Anemia Dx January 03 2015  . Diabetes mellitus without complication ([place])   . Elevated liver enzymes   . Essential hypertension Dx Apr 2016  . Fatty liver   . H/O: upper GI bleed 03/2019    variceal bleed   . History of blood transfusion 03/2019  . Liver cirrhosis secondary to NASH ([place])   . Pneumonia 03/2019  . Reflux   . Thrombocytopenia ([place])     Past Surgical History:  Procedure Laterality Date  . ESOPHAGEAL BANDING  03/22/2019   Procedure: ESOPHAGEAL BANDING;  Surgeon: Wilford Corner, MD;  [facility];  Service: Endoscopy;;  . ESOPHAGEAL BANDING  03/23/2019   Procedure: ESOPHAGEAL BANDING;  Surgeon: Wilford Corner, MD;  [facility];  Service: Endoscopy;;  . ESOPHAGEAL BANDING  09/20/2019   Procedure: ESOPHAGEAL BANDING;  Surgeon: Wilford Corner, MD;  [facility];  Service: Endoscopy;;  . ESOPHAGEAL BANDING N/A 12/21/2019   Procedure: ESOPHAGEAL BANDING;  Surgeon: Wilford Corner, MD;  [facility];  Service: Endoscopy;  Laterality: N/A;  . ESOPHAGOGASTRODUODENOSCOPY N/A 03/22/2019   Procedure: ESOPHAGOGASTRODUODENOSCOPY (EGD);  Surgeon: Wilford Corner, MD;   [facility];  Service: Endoscopy;  Laterality: N/A;  . ESOPHAGOGASTRODUODENOSCOPY N/A 03/23/2019   Procedure: ESOPHAGOGASTRODUODENOSCOPY (EGD);  Surgeon: Wilford Corner, MD;  [facility];  Service: Endoscopy;  Laterality: N/A;  . ESOPHAGOGASTRODUODENOSCOPY (EGD) WITH PROPOFOL N/A 07/23/2019   Procedure: ESOPHAGOGASTRODUODENOSCOPY (EGD) WITH PROPOFOL with possible banding;  Surgeon: Wilford Corner, MD;  [facility];  Service: Endoscopy;  Laterality: N/A;  . ESOPHAGOGASTRODUODENOSCOPY (EGD) WITH PROPOFOL N/A 09/20/2019   Procedure: ESOPHAGOGASTRODUODENOSCOPY (EGD) WITH PROPOFOL;  Surgeon: Wilford Corner, MD;  [facility];  Service: Endoscopy;  Laterality: N/A;  . ESOPHAGOGASTRODUODENOSCOPY (EGD) WITH PROPOFOL N/A 12/21/2019   Procedure: ESOPHAGOGASTRODUODENOSCOPY (EGD) WITH PROPOFOL with possible banding;  Surgeon: Wilford Corner, MD;  [facility];  Service: Endoscopy;  Laterality: N/A;  . TUBAL LIGATION  Jul 25, 2000  . VENTRAL HERNIA REPAIR N/A 07/06/2020   Procedure: VENTRAL HERNIA REPAIR WITH MESH;  Surgeon: Coralie Keens, MD;  Location: [place];  Service: General;  Laterality: N/A;    There were no vitals filed for this visit.   Subjective Assessment - 03/26/21 1523    Subjective Ikia relays about 2.5 to 3.5 pain her knees.    Pertinent History bilateral knee injections: 01/29/2021    Limitations Walking    How long can you walk comfortably? 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    Pain Onset  More than a month ago           [facility] Adult PT Treatment/Exercise - 03/26/21 0001      Knee/Hip Exercises: Stretches   Knee: Self-Stretch Limitations Tailgate knee flexion AROM 3 minutes    Gastroc Stretch Both;2 reps;30 seconds      Knee/Hip Exercises: Aerobic   Recumbent Bike Seat 5 Level 3 for 8 minutes       Knee/Hip Exercises: Machines for Strengthening   Cybex Knee Extension 10#bilat 3X10    Cybex Knee Flexion 25#bilat 3X10    Total Gym Leg Press 100# full extension (avoid hyperextension to slow full flexion) 15X 2 sets      Manual Therapy   Manual therapy comments KT tape for anterior knee support 2 vertical I strips ribbon pattern, then one horizontal I strip at joint line                    PT Short Term Goals - 03/23/21 1555      PT SHORT TERM GOAL #1   Title pt will be independent in independent in initial HEP    Time 3    Period Weeks    Status On-going    Target Date 03/27/21      PT SHORT TERM GOAL #2   Title Pt will report pain of </= 5/10 during work shift.    Baseline currently pain varies    Time 3    Status On-going    Target Date 03/27/21      PT SHORT TERM GOAL #3   Title -      PT SHORT TERM GOAL #4   Title -             PT Long Term Goals - 03/23/21 1556      PT LONG TERM GOAL #1   Title Pt will be independent in advanced HEP    Time 6    Period Weeks    Status On-going      PT LONG TERM GOAL #2   Title Pt will improve her FOTO to 71% function.    Time 6    Period Weeks    Status On-going      PT LONG TERM GOAL #3   Title Pt will improve bilateral knee flexion to 5/5 for increaed functional mobility.    Time 6    Period Weeks    Status On-going      PT LONG TERM GOAL #4   Title Pt will be able to perform sit to stand in </= 14 seconds with no UE support with no pain reported.    Baseline 18 seocnds with bilateral knee pain reported.    Time 6    Period Weeks    Status On-going                 Plan - 03/26/21 1552    Clinical Impression Statement Continued to focus on knee strengthening to her tolerance, she did relay some benefit from the KT tape so this was applied again today. Continue POC.    Personal Factors and Comorbidities Comorbidity 3+    Comorbidities DM, HTN, obesity, h/p pneumonia, fatty liver, GI bleed,  thrombocytopenia, h/o hip pain    Examination-Activity Limitations Lift;Stairs;Squat;Stand;Other    Examination-Participation Restrictions Community Activity;Other;Occupation    Stability/Clinical Decision Making Stable/Uncomplicated    Rehab Potential Good    PT Frequency 2x / week    PT Duration 6 weeks  PT Treatment/Interventions ADLs/Self Care Home Management;Electrical Stimulation;Cryotherapy;Iontophoresis 38m/ml Dexamethasone;Moist Heat;Ultrasound;Gait training;Stair training;Functional mobility training;Therapeutic activities;Therapeutic exercise;Balance training;Neuromuscular re-education;Patient/family education;Manual techniques;Passive range of motion;Dry needling;Taping    PT Next Visit Plan bike vs Nustep, hamstring stretching, strengtheing, quad control, dynamic balance    PT Home Exercise Plan Access Code: [email or web address]    Consulted and Agree with Plan of Care Patient           Patient will benefit from skilled therapeutic intervention in order to improve the following deficits and impairments:  Pain,Postural dysfunction,Obesity,Decreased balance,Increased edema,Decreased activity tolerance,Decreased endurance,Decreased strength,Decreased range of motion,Difficulty walking  Visit Diagnosis: Difficulty in walking, not elsewhere classified  Muscle weakness (generalized)  Stiffness of left knee, not elsewhere classified  Stiffness of right knee, not elsewhere classified  Acute pain of left knee  Chronic pain of right knee     Problem List Patient Active Problem List   Diagnosis Date Noted  . Ingrown toenail 10/16/2019  . Cirrhosis ([place] 07/23/2019  . Esophageal varices ([place] 07/23/2019  . Diabetes mellitus ([place] 04/20/2019  . Acute respiratory insufficiency   . Respiratory failure ([place]   . Shock circulatory ([place]   . H/O: upper GI bleed 03/21/2019  . Impingement syndrome of left shoulder 04/21/2017  . Low back pain 03/18/2016  . Left shoulder pain 03/18/2016   . Esophageal reflux 03/18/2016  . Lateral pain of left hip 10/25/2015  . Heel pain, bilateral 05/03/2015  . Obesity (BMI 30-39.9) 05/03/2015  . Hearing loss in right ear 03/01/2015  . Family history of diabetes mellitus in mother 03/01/2015  . Essential hypertension 01/18/2015  . Anemia, iron deficiency 01/04/2015  . Musculoskeletal chest pain 01/04/2015  . Thrombocytopenia (HE[place] 01/04/2015  . Transaminitis 01/03/2015    BSilvestre Mesi5/23/2022, 3:54 PM  [facility] [address] [address]Phone: [phone]  Fax:  [phone] Name: MTaronda ComachoMRN: [id number] of Birth: 1Jan 14, 1977

## 2021-03-28 ENCOUNTER — Other Ambulatory Visit: Payer: Self-pay

## 2021-03-28 ENCOUNTER — Encounter: Payer: Self-pay | Admitting: Rehabilitative and Restorative Service Providers"

## 2021-03-28 ENCOUNTER — Ambulatory Visit (INDEPENDENT_AMBULATORY_CARE_PROVIDER_SITE_OTHER): Payer: 59 | Admitting: Rehabilitative and Restorative Service Providers"

## 2021-03-28 ENCOUNTER — Other Ambulatory Visit: Payer: Self-pay | Admitting: Nurse Practitioner

## 2021-03-28 DIAGNOSIS — M6281 Muscle weakness (generalized): Secondary | ICD-10-CM

## 2021-03-28 DIAGNOSIS — M25561 Pain in right knee: Secondary | ICD-10-CM

## 2021-03-28 DIAGNOSIS — M25661 Stiffness of right knee, not elsewhere classified: Secondary | ICD-10-CM | POA: Diagnosis not present

## 2021-03-28 DIAGNOSIS — M25662 Stiffness of left knee, not elsewhere classified: Secondary | ICD-10-CM

## 2021-03-28 DIAGNOSIS — M25562 Pain in left knee: Secondary | ICD-10-CM

## 2021-03-28 DIAGNOSIS — R262 Difficulty in walking, not elsewhere classified: Secondary | ICD-10-CM | POA: Diagnosis not present

## 2021-03-28 DIAGNOSIS — G8929 Other chronic pain: Secondary | ICD-10-CM

## 2021-03-28 NOTE — Therapy (Signed)
[facility] [address] [place], [place], [address] Phone: [phone]   Fax:  [phone]  Physical Therapy Treatment  Patient Details  Name: Angela Castillo MRN: [id number] Date of Birth: 1975-11-20 Referring Provider (PT): Jean Rosenthal   Encounter Date: 03/28/2021   PT End of Session - 03/28/21 1545    Visit Number 5    Number of Visits 13    Date for PT Re-Evaluation 04/20/21    Progress Note Due on Visit 10    PT Start Time 6381    PT Stop Time 1556    PT Time Calculation (min) 41 min    Activity Tolerance Patient tolerated treatment well;No increased pain;Patient limited by fatigue    Behavior During Therapy [facility] for tasks assessed/performed           Past Medical History:  Diagnosis Date  . Anemia Dx January 03 2015  . Diabetes mellitus without complication ([place])   . Elevated liver enzymes   . Essential hypertension Dx Apr 2016  . Fatty liver   . H/O: upper GI bleed 03/2019    variceal bleed   . History of blood transfusion 03/2019  . Liver cirrhosis secondary to NASH ([place])   . Pneumonia 03/2019  . Reflux   . Thrombocytopenia ([place])     Past Surgical History:  Procedure Laterality Date  . ESOPHAGEAL BANDING  03/22/2019   Procedure: ESOPHAGEAL BANDING;  Surgeon: Wilford Corner, MD;  [facility];  Service: Endoscopy;;  . ESOPHAGEAL BANDING  03/23/2019   Procedure: ESOPHAGEAL BANDING;  Surgeon: Wilford Corner, MD;  [facility];  Service: Endoscopy;;  . ESOPHAGEAL BANDING  09/20/2019   Procedure: ESOPHAGEAL BANDING;  Surgeon: Wilford Corner, MD;  [facility];  Service: Endoscopy;;  . ESOPHAGEAL BANDING N/A 12/21/2019   Procedure: ESOPHAGEAL BANDING;  Surgeon: Wilford Corner, MD;  [facility];  Service: Endoscopy;  Laterality: N/A;  . ESOPHAGOGASTRODUODENOSCOPY N/A 03/22/2019   Procedure: ESOPHAGOGASTRODUODENOSCOPY (EGD);  Surgeon: Wilford Corner, MD;   [facility];  Service: Endoscopy;  Laterality: N/A;  . ESOPHAGOGASTRODUODENOSCOPY N/A 03/23/2019   Procedure: ESOPHAGOGASTRODUODENOSCOPY (EGD);  Surgeon: Wilford Corner, MD;  [facility];  Service: Endoscopy;  Laterality: N/A;  . ESOPHAGOGASTRODUODENOSCOPY (EGD) WITH PROPOFOL N/A 07/23/2019   Procedure: ESOPHAGOGASTRODUODENOSCOPY (EGD) WITH PROPOFOL with possible banding;  Surgeon: Wilford Corner, MD;  [facility];  Service: Endoscopy;  Laterality: N/A;  . ESOPHAGOGASTRODUODENOSCOPY (EGD) WITH PROPOFOL N/A 09/20/2019   Procedure: ESOPHAGOGASTRODUODENOSCOPY (EGD) WITH PROPOFOL;  Surgeon: Wilford Corner, MD;  [facility];  Service: Endoscopy;  Laterality: N/A;  . ESOPHAGOGASTRODUODENOSCOPY (EGD) WITH PROPOFOL N/A 12/21/2019   Procedure: ESOPHAGOGASTRODUODENOSCOPY (EGD) WITH PROPOFOL with possible banding;  Surgeon: Wilford Corner, MD;  [facility];  Service: Endoscopy;  Laterality: N/A;  . TUBAL LIGATION  Jul 25, 2000  . VENTRAL HERNIA REPAIR N/A 07/06/2020   Procedure: VENTRAL HERNIA REPAIR WITH MESH;  Surgeon: Coralie Keens, MD;  Location: [facility];  Service: General;  Laterality: N/A;    There were no vitals filed for this visit.   Subjective Assessment - 03/28/21 1522    Subjective Angela Castillo relays about 2.5 to 3/10 pain.  She reports "fair" HEP compliance.    Pertinent History Bilateral knee injections: 01/29/2021    Limitations Walking    How long can you walk comfortably? 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 Yes    Pain Score 3     Pain Location Knee    Pain Orientation Left;Right    Pain Descriptors / Indicators Aching;Sore;Sharp    Pain Type Chronic pain    Pain Onset More than a month ago    Pain Frequency Constant    Aggravating Factors  Prolonged WB or prolonged sitting    Pain  Relieving Factors Tylenol    Effect of Pain on Daily Activities Limits endurance with ADLs    Multiple Pain Sites No                             [facility] Adult PT Treatment/Exercise - 03/28/21 0001      Exercises   Exercises Knee/Hip      Knee/Hip Exercises: Aerobic   Recumbent Bike Seat 5 Level 3 for 8 minutes      Knee/Hip Exercises: Machines for Strengthening   Total Gym Leg Press 112# full extension (avoid hyperextension to slow full flexion) 15X 2 sets      Knee/Hip Exercises: Seated   Long Arc Quad Strengthening;3 sets;5 reps;Limitations    Long Arc Quad Limitations Seated straight leg raises    Sit to [employer] 2 sets;5 reps;without UE support      Knee/Hip Exercises: Sidelying   Hip ABduction Strengthening;Both;2 sets;10 reps                  PT Education - 03/28/21 1540    Education Details Reviewed HEP with cues PRN    Person(s) Educated Patient    Methods Explanation;Demonstration;Verbal cues;Tactile cues    Comprehension Verbal cues required;Tactile cues required;Returned demonstration;Verbalized understanding;Need further instruction            PT Short Term Goals - 03/28/21 1541      PT SHORT TERM GOAL #1   Title pt will be independent in independent in initial HEP    Time 3    Period Weeks    Status Achieved    Target Date 03/27/21      PT SHORT TERM GOAL #2   Title Pt will report pain of </= 5/10 during work shift.    Baseline currently pain varies    Time 3    Status Achieved    Target Date 03/27/21      PT SHORT TERM GOAL #3   Title -      PT SHORT TERM GOAL #4   Title -             PT Long Term Goals - 03/28/21 1542      PT LONG TERM GOAL #1   Title Pt will be independent in advanced HEP    Time 6    Period Weeks    Status On-going      PT LONG TERM GOAL #2   Title Pt will improve her FOTO to 71% function.    Baseline 57 (was 50)    Time 6    Period Weeks    Status On-going      PT LONG TERM GOAL #3    Title Pt will improve bilateral knee flexion to 5/5 for increaed functional mobility.    Time 6    Period Weeks    Status On-going      PT LONG TERM GOAL #4   Title Pt will be able to perform sit to stand in </= 14 seconds with no UE support with  no pain reported.    Time 6    Period Weeks    Status Achieved                 Plan - 03/28/21 1556    Clinical Impression Statement Emphasis remains quadriceps strengthening.  Angela Castillo naturally hyper-extends in standing and I tried to make her aware of this and cued her to try to keep her knees straight and not hyper-extended.  Reviewed HEP.    Personal Factors and Comorbidities Comorbidity 3+    Comorbidities DM, HTN, obesity, h/p pneumonia, fatty liver, GI bleed, thrombocytopenia, h/o hip pain    Examination-Activity Limitations Lift;Stairs;Squat;Stand;Other    Examination-Participation Restrictions Community Activity;Other;Occupation    Stability/Clinical Decision Making Stable/Uncomplicated    Rehab Potential Good    PT Frequency 2x / week    PT Duration 6 weeks    PT Treatment/Interventions ADLs/Self Care Home Management;Electrical Stimulation;Cryotherapy;Iontophoresis 65m/ml Dexamethasone;Moist Heat;Ultrasound;Gait training;Stair training;Functional mobility training;Therapeutic activities;Therapeutic exercise;Balance training;Neuromuscular re-education;Patient/family education;Manual techniques;Passive range of motion;Dry needling;Taping    PT Next Visit Plan Heavy emphasis on quadriceps strength and avoiding knee hyper-extension    PT Home Exercise Plan Access Code: [email or web address]    Consulted and Agree with Plan of Care Patient           Patient will benefit from skilled therapeutic intervention in order to improve the following deficits and impairments:  Pain,Postural dysfunction,Obesity,Decreased balance,Increased edema,Decreased activity tolerance,Decreased endurance,Decreased strength,Decreased range of motion,Difficulty  walking  Visit Diagnosis: Difficulty in walking, not elsewhere classified  Muscle weakness (generalized)  Stiffness of left knee, not elsewhere classified  Stiffness of right knee, not elsewhere classified  Acute pain of left knee  Chronic pain of right knee     Problem List Patient Active Problem List   Diagnosis Date Noted  . Ingrown toenail 10/16/2019  . Cirrhosis ([place] 07/23/2019  . Esophageal varices ([place] 07/23/2019  . Diabetes mellitus ([place] 04/20/2019  . Acute respiratory insufficiency   . Respiratory failure ([place]   . Shock circulatory ([place]   . H/O: upper GI bleed 03/21/2019  . Impingement syndrome of left shoulder 04/21/2017  . Low back pain 03/18/2016  . Left shoulder pain 03/18/2016  . Esophageal reflux 03/18/2016  . Lateral pain of left hip 10/25/2015  . Heel pain, bilateral 05/03/2015  . Obesity (BMI 30-39.9) 05/03/2015  . Hearing loss in right ear 03/01/2015  . Family history of diabetes mellitus in mother 03/01/2015  . Essential hypertension 01/18/2015  . Anemia, iron deficiency 01/04/2015  . Musculoskeletal chest pain 01/04/2015  . Thrombocytopenia ([place] 01/04/2015  . Transaminitis 01/03/2015    RFarley LyPT, MPT 03/28/2021, 3:59 PM  [facility] [address] [address]Phone: [phone]  Fax:  [phone] Name: MTyneka ScafidiMRN: [id number] of Birth: 111-03-1976

## 2021-03-28 NOTE — Telephone Encounter (Signed)
Requested medication (s) are due for refill today unsure  Requested medication (s) are on the active medication list -yes  Future visit scheduled -yes  Last refill: 02/12/21  Notes to clinic: Not sure Rx for chronic use- sent for review of request  Requested Prescriptions  Pending Prescriptions Disp Refills   benzonatate (TESSALON) 200 MG capsule [Pharmacy Med Name: BENZONATATE 200 MG CAPSULE] 20 capsule 0    Sig: TAKE 1 CAPSULE BY MOUTH 2 TIMES DAILY AS NEEDED FOR COUGH      Ear, Nose, and Throat:  Antitussives/Expectorants Passed - 03/28/2021 10:52 AM      Passed - Valid encounter within last 12 months    Recent Outpatient Visits           1 month ago Type 2 diabetes mellitus without complication, without long-term current use of insulin ([place])   [place], [place] W, NP   4 months ago Diabetes mellitus type 2, uncontrolled, with complications ([place])   [place], Vernia Buff, NP   5 months ago Essential hypertension   [place], RPH-CPP   9 months ago Essential hypertension   [place], Vernia Buff, NP   11 months ago Essential hypertension   [place], RPH-CPP       Future Appointments             In 1 week Ninfa Linden Lind Guest, MD [place]   In 1 month Gildardo Pounds, NP [place]                 Requested Prescriptions  Pending Prescriptions Disp Refills   benzonatate (TESSALON) 200 MG capsule [Pharmacy Med Name: BENZONATATE 200 MG CAPSULE] 20 capsule 0    Sig: TAKE 1 CAPSULE BY MOUTH 2 TIMES DAILY AS NEEDED FOR COUGH      Ear, Nose, and Throat:  Antitussives/Expectorants Passed - 03/28/2021 10:52 AM      Passed - Valid encounter within last 12 months    Recent Outpatient Visits            1 month ago Type 2 diabetes mellitus without complication, without long-term current use of insulin ([place])   [place], [place] W, NP   4 months ago Diabetes mellitus type 2, uncontrolled, with complications [facility])   [place], Vernia Buff, NP   5 months ago Essential hypertension   [place], Jarome [place], RPH-CPP   9 months ago Essential hypertension   [place], Vernia Buff, NP   11 months ago Essential hypertension   [place], RPH-CPP       Future Appointments             In 1 week Ninfa Linden Lind Guest, MD [facility]   In 1 month Gildardo Pounds, NP [place]

## 2021-03-30 ENCOUNTER — Ambulatory Visit
Admission: RE | Admit: 2021-03-30 | Discharge: 2021-03-30 | Disposition: A | Discharge status: Home / Self Care | Payer: 59 | Source: Ambulatory Visit | Attending: Orthopaedic Surgery | Admitting: Orthopaedic Surgery

## 2021-03-30 ENCOUNTER — Other Ambulatory Visit: Payer: Self-pay

## 2021-03-30 ENCOUNTER — Ambulatory Visit
Admission: RE | Admit: 2021-03-30 | Discharge: 2021-03-30 | Disposition: A | Discharge status: Home / Self Care | Payer: 59 | Source: Ambulatory Visit | Attending: Gastroenterology | Admitting: Gastroenterology

## 2021-03-30 DIAGNOSIS — K7030 Alcoholic cirrhosis of liver without ascites: Secondary | ICD-10-CM

## 2021-03-30 DIAGNOSIS — G8929 Other chronic pain: Secondary | ICD-10-CM

## 2021-03-30 IMAGING — US US ABDOMEN COMPLETE
1 series · 13 of 25 positions shown · non-contrast
Comparison: CT [DATE] and ultrasound [DATE].

CLINICAL DATA: Cirrhosis

EXAM:
ABDOMEN ULTRASOUND COMPLETE

[Series 1: us abdomen complete · 0.21mm/px · 13 of 76 slices shown]
[im 1/76]
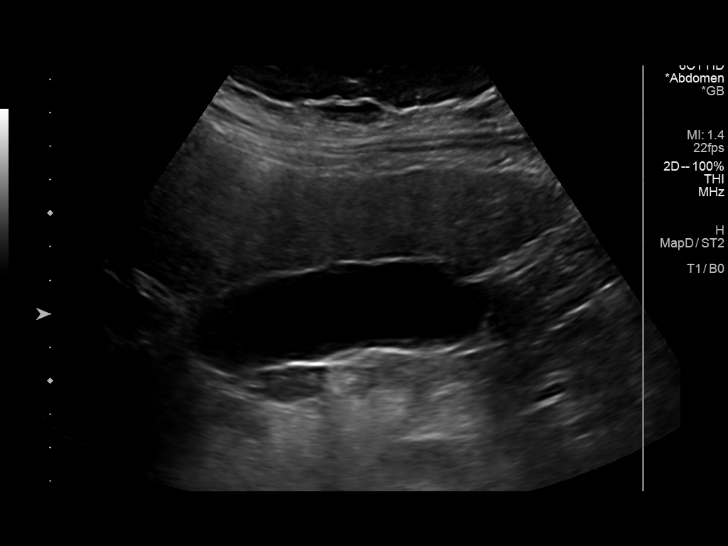
[im 7/76]
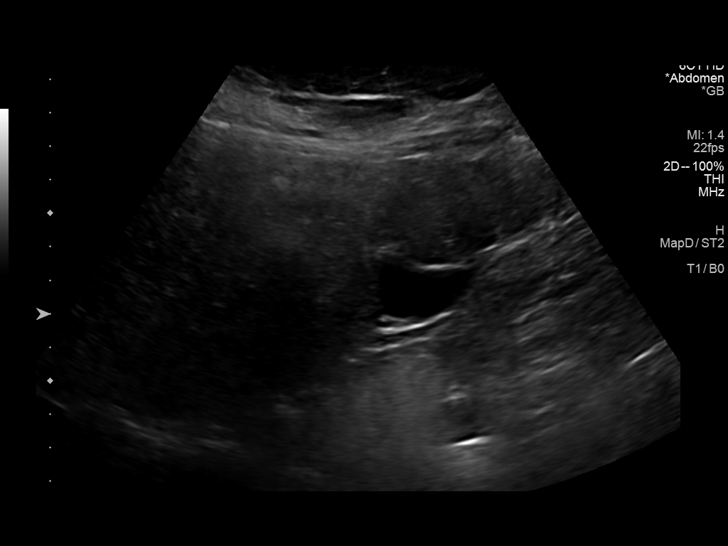
[im 13/76]
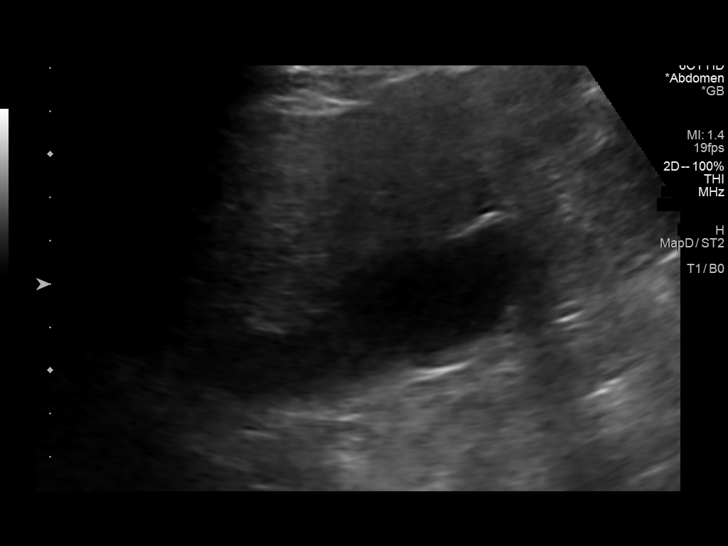
[im 19/76]
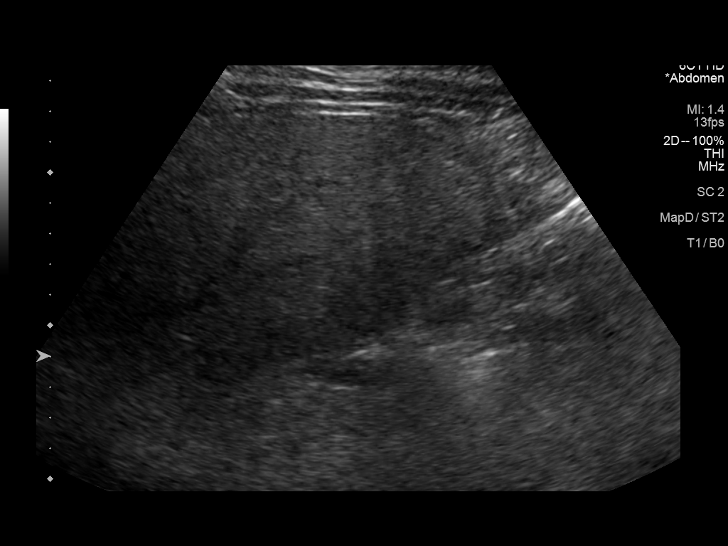
[im 26/76]
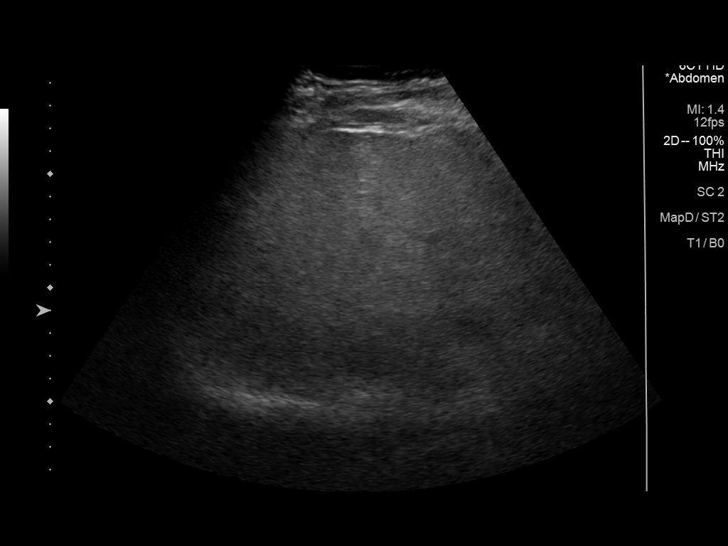
[im 32/76]
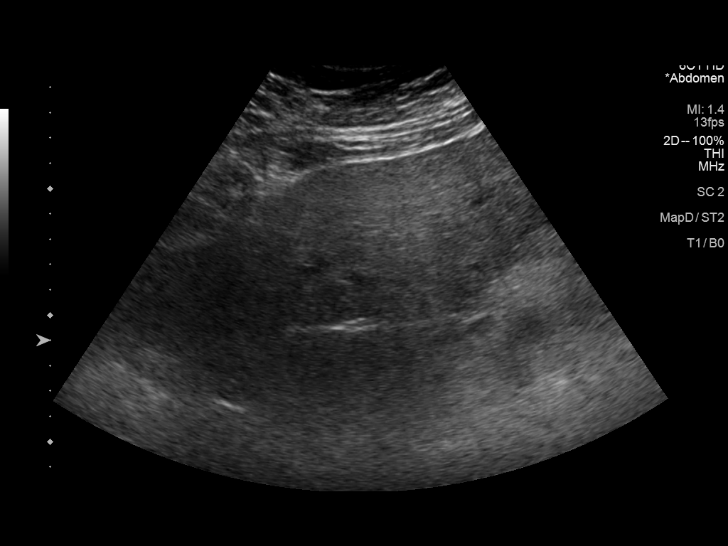
[im 38/76]
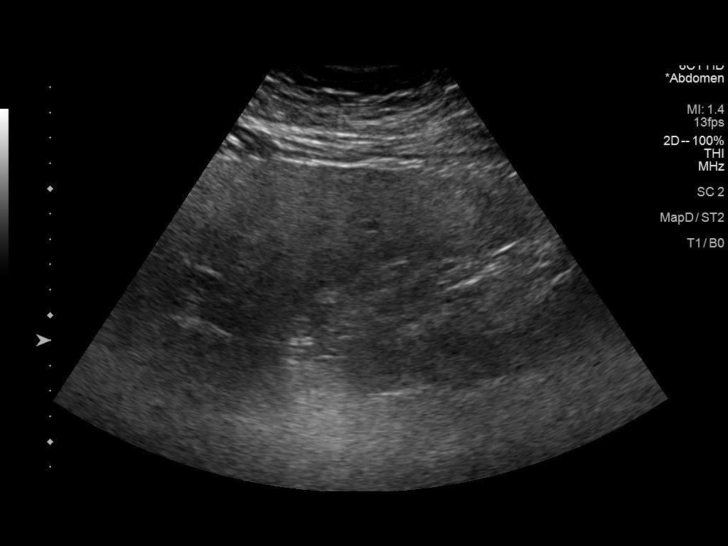
[im 44/76]
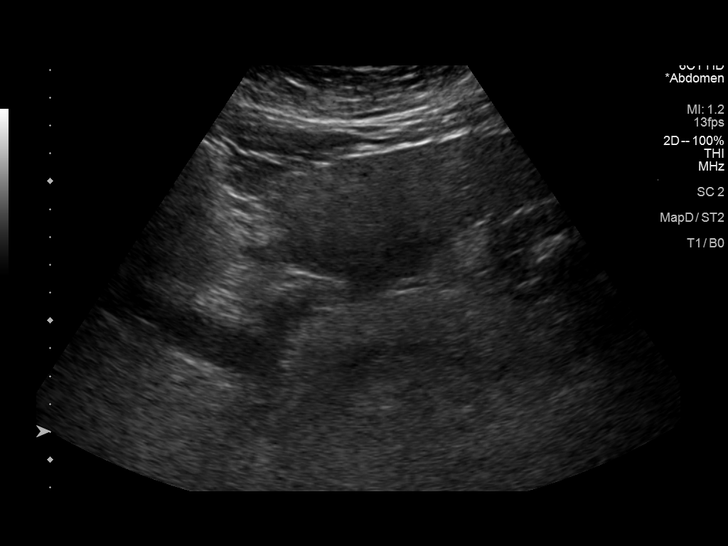
[im 51/76]
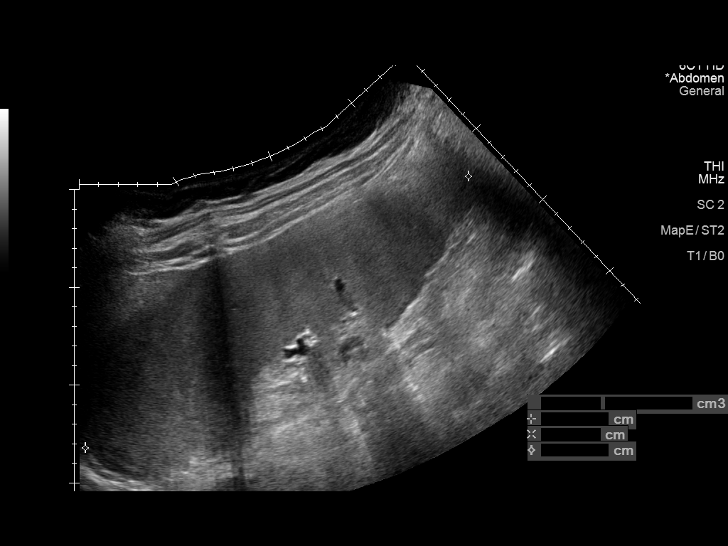
[im 57/76]
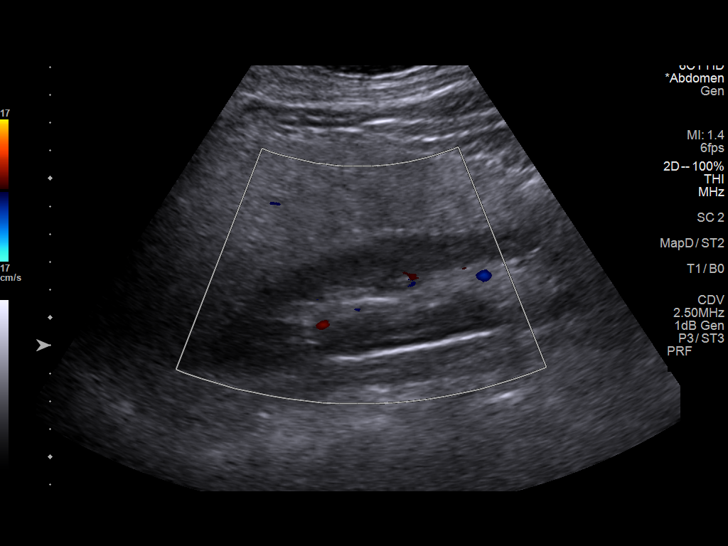
[im 63/76]
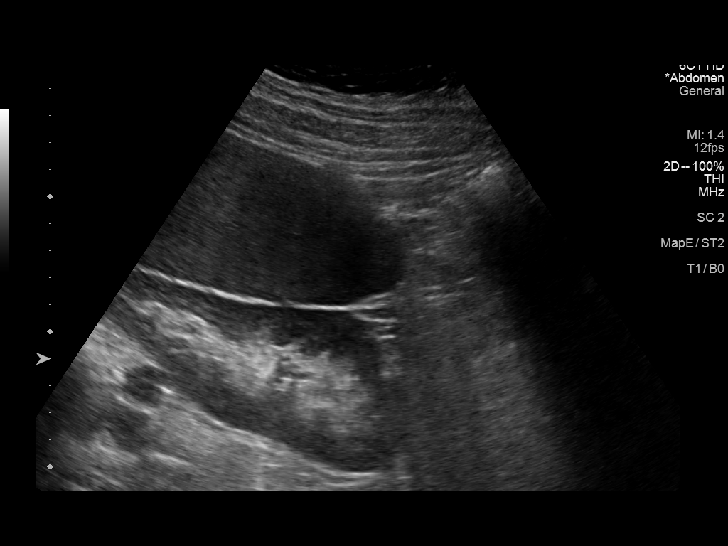
[im 69/76]
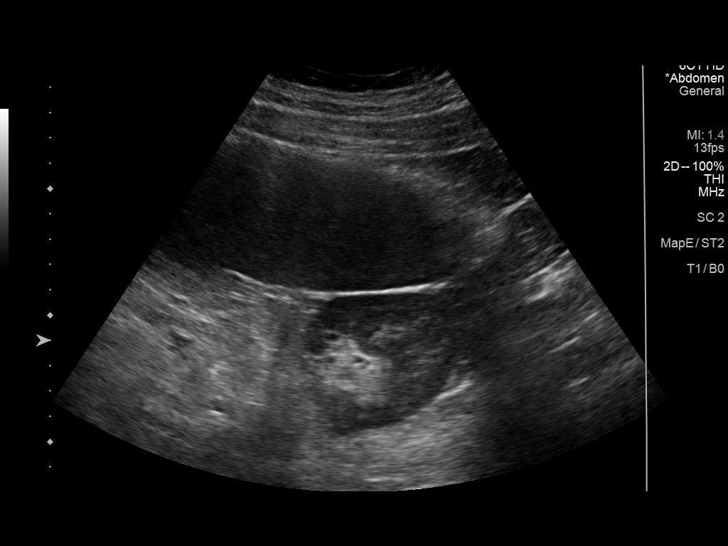
[im 76/76]
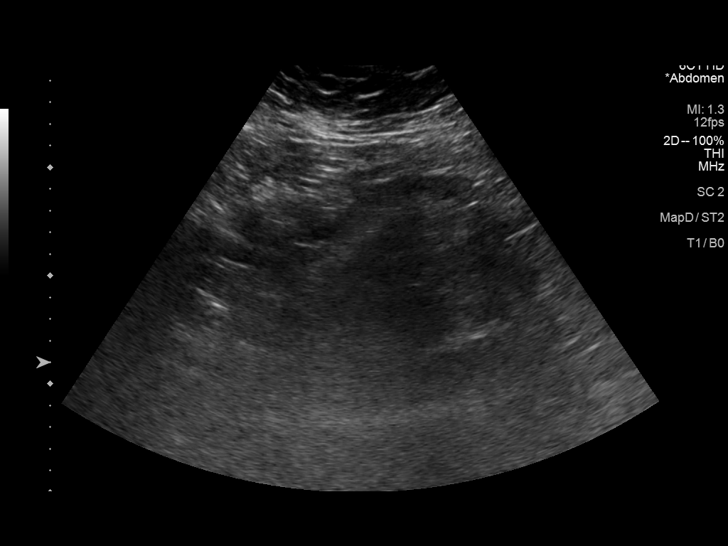

[13 of 25 positions shown; findings below may reference images not displayed]

FINDINGS: Gallbladder: Cholelithiasis measuring up to 9 mm. No pericholecystic
fluid or wall thickening visualized. No sonographic Murphy sign
noted by sonographer.

Common bile duct: Diameter: 3 mm

Liver: No focal lesion identified. Coarsened hepatic echotexture
with nodular hepatic contour consistent with given diagnosis of
cirrhosis. Portal vein is patent on color Doppler imaging with
normal direction of blood flow towards the liver.

IVC: No abnormality visualized.

Pancreas: Visualized portion unremarkable.

Spleen: Splenomegaly measuring 23 x 20 x 9 cm (volume = [3P]
cm^3).

Right Kidney: Length: 13.7 cm. Echogenicity within normal limits. No
mass or hydronephrosis visualized.

Left Kidney: Length: 13.8 cm. Echogenicity within normal limits. No
mass or hydronephrosis visualized.

Abdominal aorta: No aneurysm visualized.

Other findings: None.
IMPRESSION: 1. Cirrhotic morphology with findings of portal hypertension
including splenomegaly. No ascites or overt focal hepatic lesion
visualized.
2. Cholelithiasis without sonographic evidence of acute
cholecystitis.

## 2021-03-30 IMAGING — MR MR KNEE*R* W/O CM
6 series · 40 of 40 positions shown · non-contrast
Comparison: None.

CLINICAL DATA: Chronic right knee pain

EXAM:
MRI OF THE RIGHT KNEE WITHOUT CONTRAST
TECHNIQUE: Multiplanar, multisequence MR imaging of the knee was performed. No
intravenous contrast was administered.

[Series 6: T2 fat-sat · axial · right · 4.0mm · 0.50mm/px · z∈[-115,+37]mm · 9 of 36 slices shown (1 of 3)]
[im 1/36]
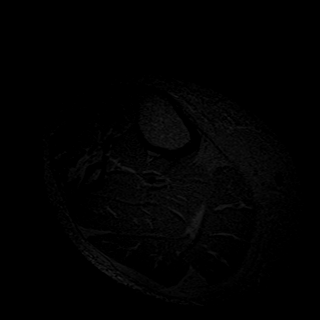
[im 5/36]
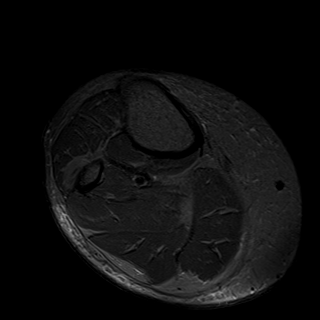
[im 9/36]
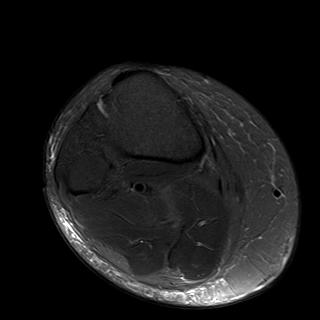
[im 14/36]
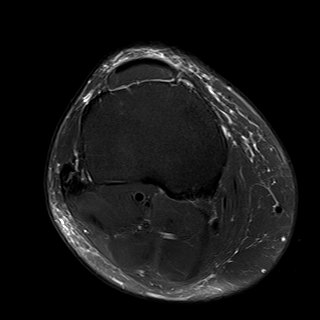
[im 18/36]
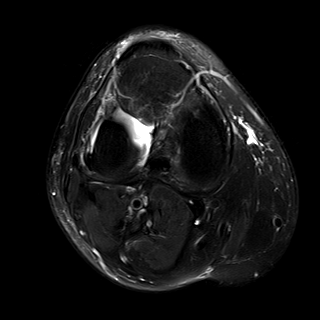
[im 22/36]
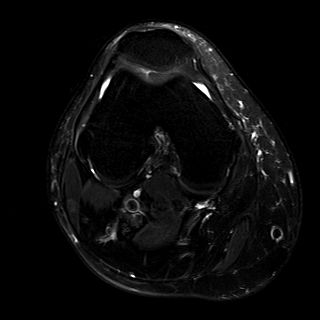
[im 27/36]
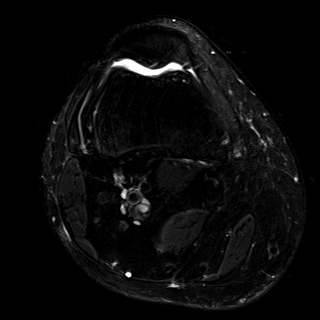
[im 31/36]
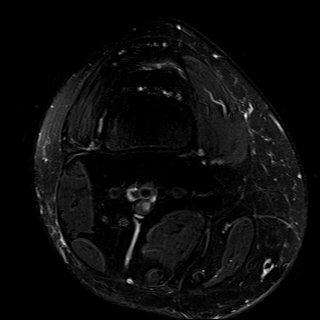
[im 36/36]
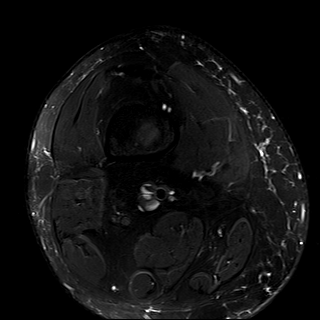

[Series 7: T2 fat-sat · coronal · right · 4.0mm · 0.47mm/px · 7 of 28 slices shown (2 of 3)]
[im 1/28]
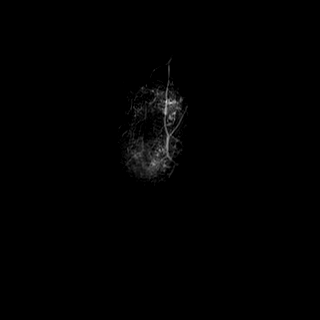
[im 5/28]
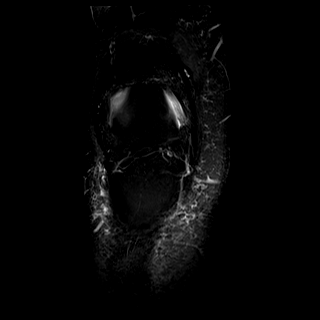
[im 10/28]
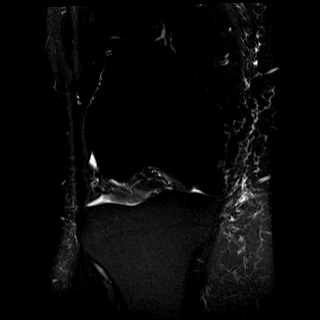
[im 14/28]
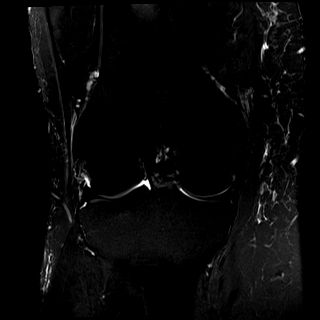
[im 19/28]
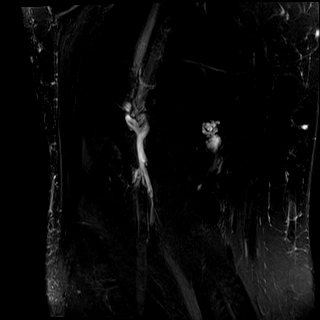
[im 23/28]
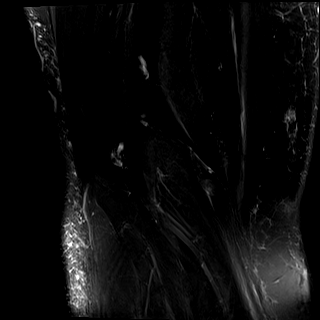
[im 28/28]
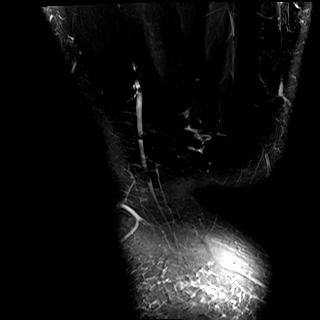

[Series 8: T1 · coronal · right · 4.0mm · 0.47mm/px · 6 of 28 slices shown]
[im 1/28]
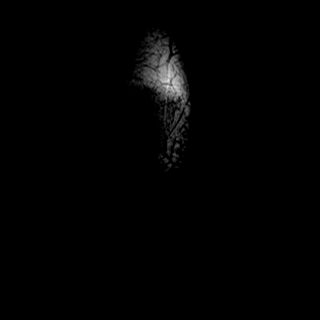
[im 6/28]
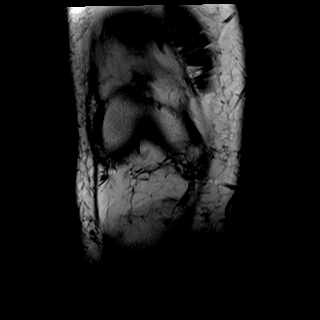
[im 11/28]
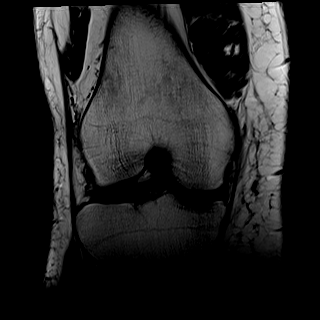
[im 17/28]
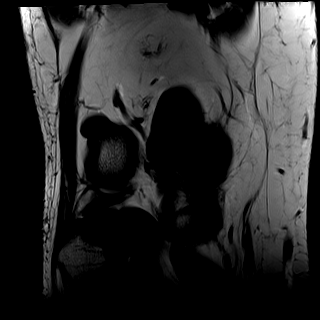
[im 22/28]
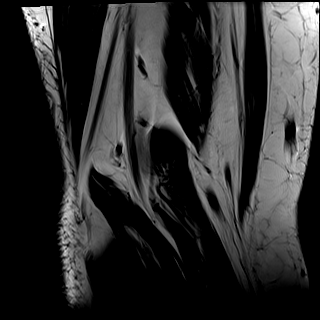
[im 28/28]
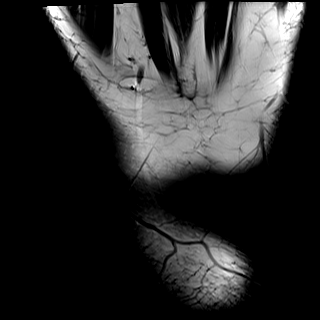

[Series 9: PD fat-sat · coronal · right · 3.0mm · 0.47mm/px · 6 of 28 slices shown (1 of 2)]
[im 1/28]
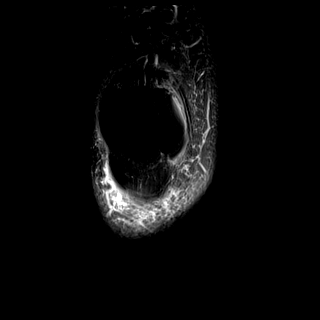
[im 6/28]
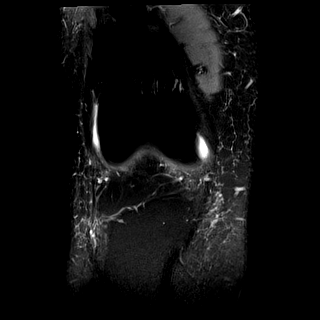
[im 11/28]
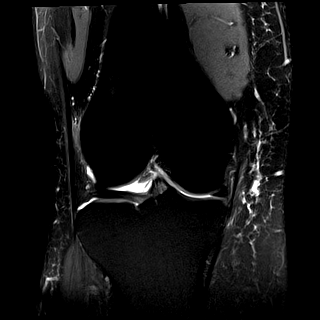
[im 17/28]
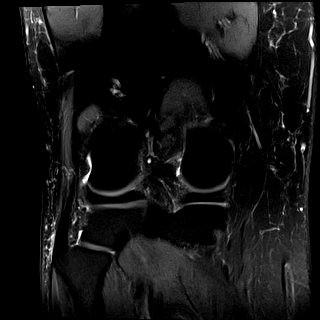
[im 22/28]
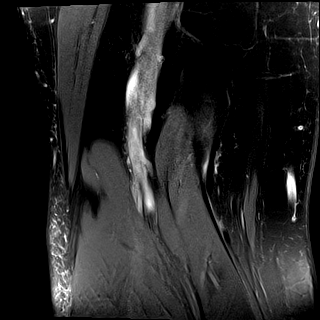
[im 28/28]
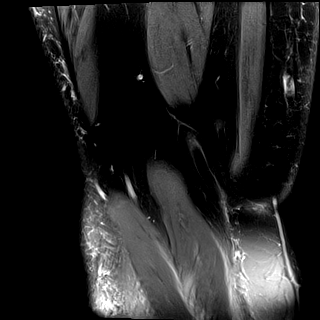

[Series 10: PD fat-sat · sagittal · right · 3.0mm · 0.39mm/px · 6 of 27 slices shown (2 of 2)]
[im 1/27]
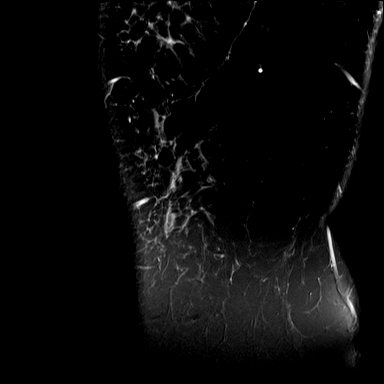
[im 6/27]
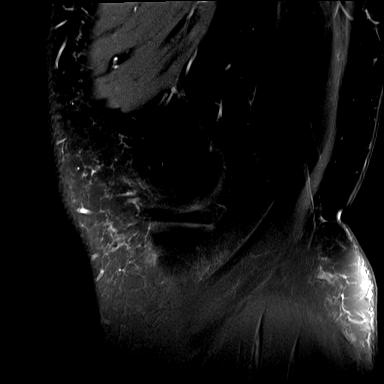
[im 11/27]
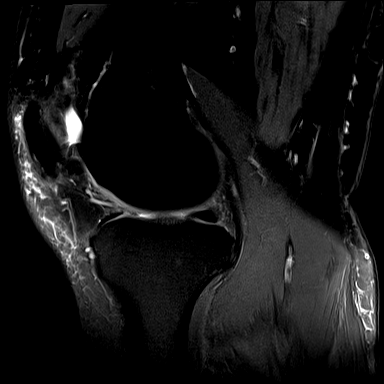
[im 16/27]
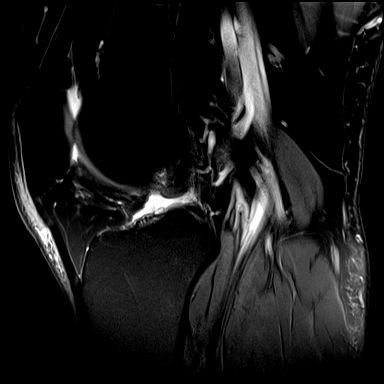
[im 21/27]
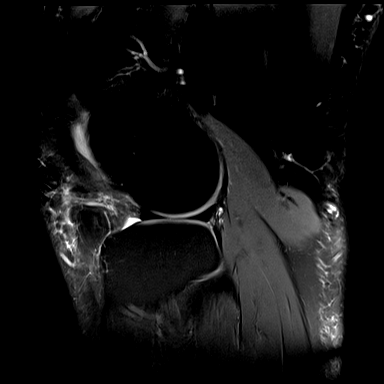
[im 27/27]
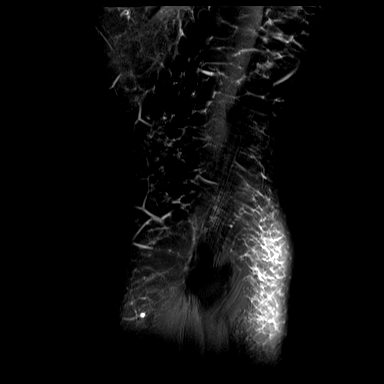

[Series 11: T2 fat-sat · sagittal · right · 3.0mm · 0.39mm/px · 6 of 27 slices shown (3 of 3)]
[im 1/27]
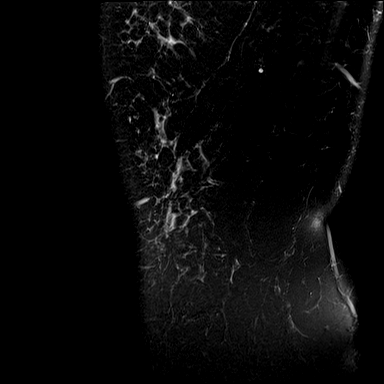
[im 6/27]
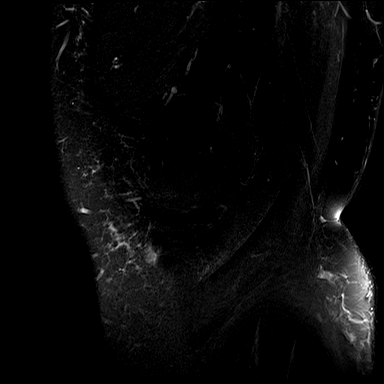
[im 11/27]
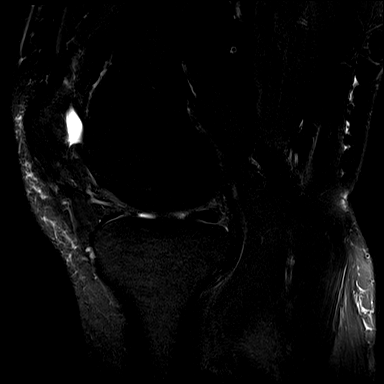
[im 16/27]
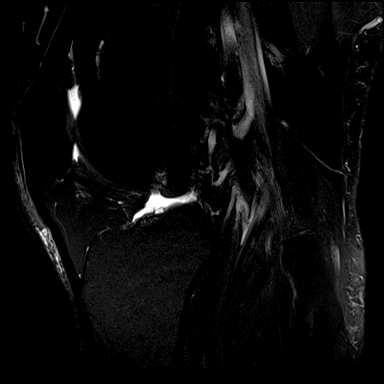
[im 21/27]
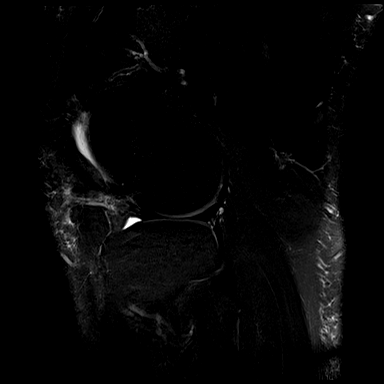
[im 27/27]
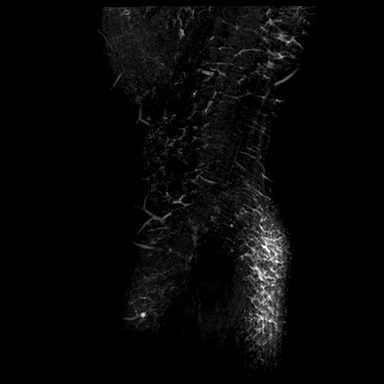

[40 of 40 positions shown; findings below may reference images not displayed]

FINDINGS: MENISCI

Medial: Intact.

Lateral: Intact.

LIGAMENTS

Cruciates: ACL and PCL are intact.

Collaterals: Medial collateral ligament is intact. Lateral
collateral ligament complex is intact.

CARTILAGE

Patellofemoral:  No chondral defect.

Medial:  No chondral defect.

Lateral:  No chondral defect.

JOINT: No joint effusion. Normal KLEVOR. No plical
thickening.

POPLITEAL FOSSA: Popliteus tendon is intact. No Baker's cyst.

EXTENSOR MECHANISM: Intact quadriceps tendon. Intact patellar
tendon. Intact lateral patellar retinaculum. Intact medial patellar
retinaculum. Intact MPFL.

BONES: No aggressive osseous lesion. No fracture or dislocation.

Other: No fluid collection or hematoma. Muscles are normal.
IMPRESSION: 1. No internal derangement of right knee.
2.  No acute osseous injury of the right knee.

## 2021-03-30 IMAGING — MR MR KNEE*L* W/O CM
6 series · 40 of 40 positions shown · non-contrast
Comparison: None.

CLINICAL DATA: Chronic left knee pain.  Medial pain.

EXAM:
MRI OF THE LEFT KNEE WITHOUT CONTRAST
TECHNIQUE: Multiplanar, multisequence MR imaging of the knee was performed. No
intravenous contrast was administered.

[Series 6: T2 fat-sat · axial · left · 4.0mm · 0.50mm/px · z∈[-153,+1]mm · 7 of 36 slices shown (1 of 3)]
[im 1/36]
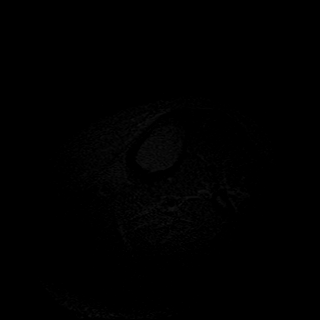
[im 6/36]
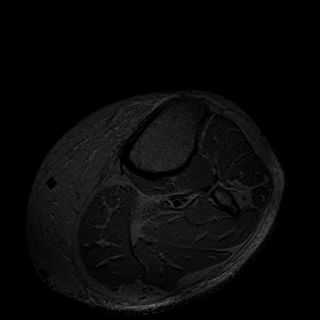
[im 12/36]
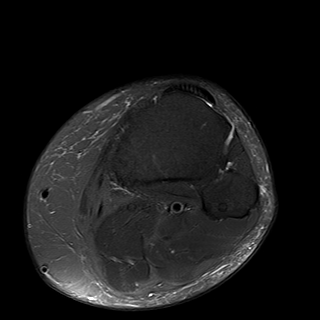
[im 18/36]
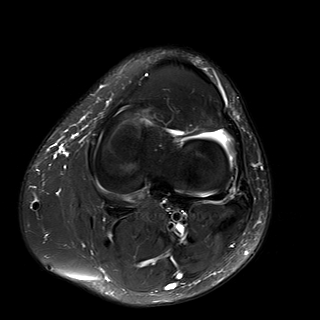
[im 24/36]
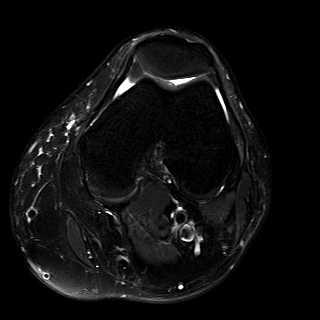
[im 30/36]
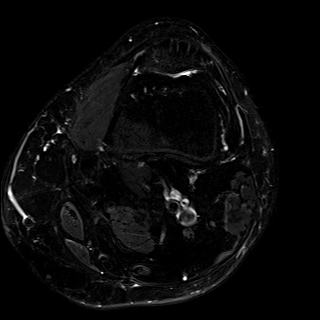
[im 36/36]
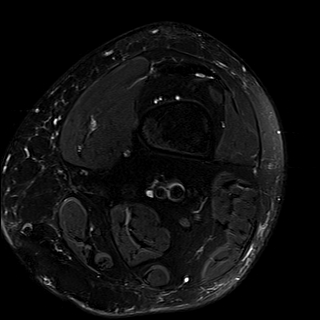

[Series 7: T2 fat-sat · coronal · left · 4.0mm · 0.47mm/px · 7 of 28 slices shown (2 of 3)]
[im 1/28]
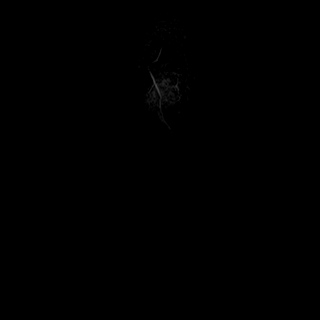
[im 5/28]
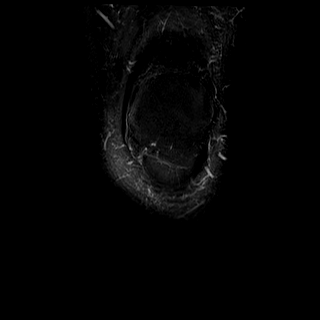
[im 10/28]
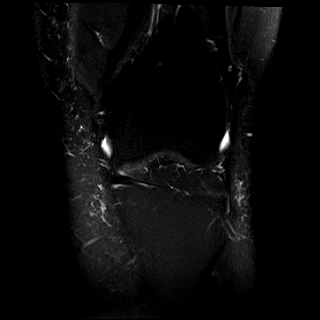
[im 14/28]
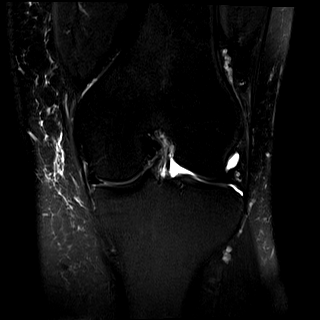
[im 19/28]
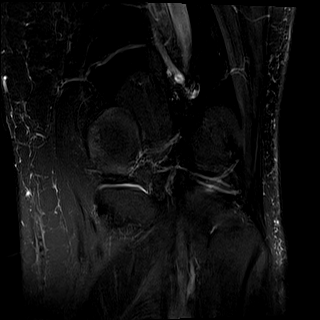
[im 23/28]
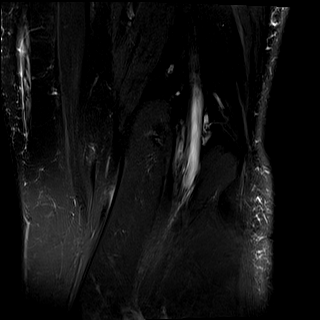
[im 28/28]
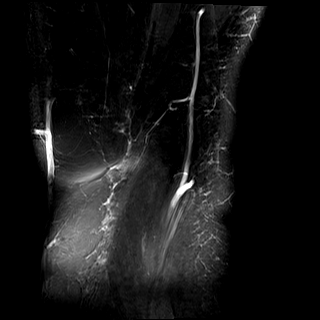

[Series 8: T1 · coronal · left · 4.0mm · 0.47mm/px · 7 of 28 slices shown]
[im 1/28]
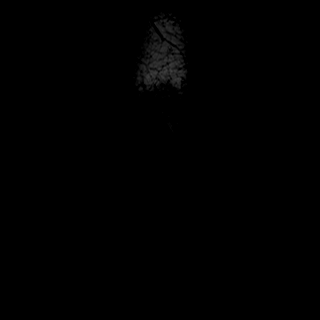
[im 5/28]
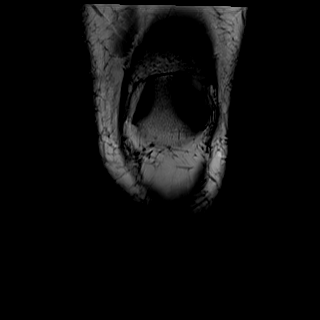
[im 10/28]
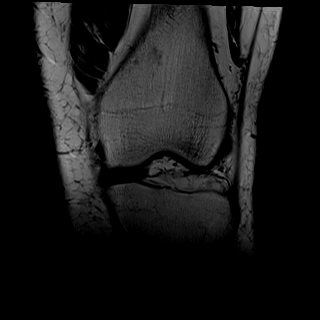
[im 14/28]
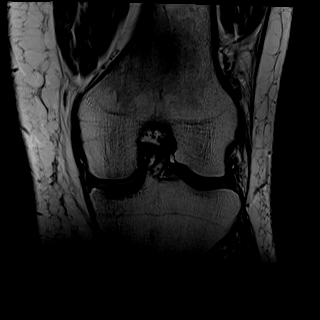
[im 19/28]
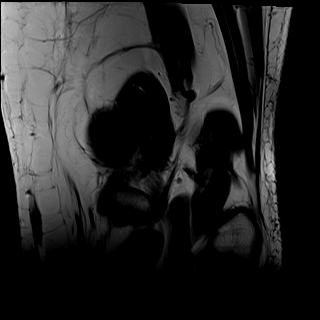
[im 23/28]
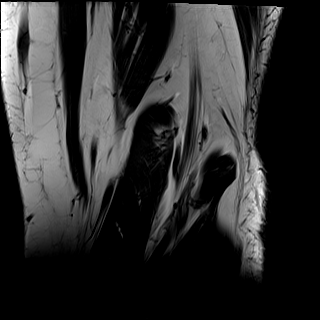
[im 28/28]
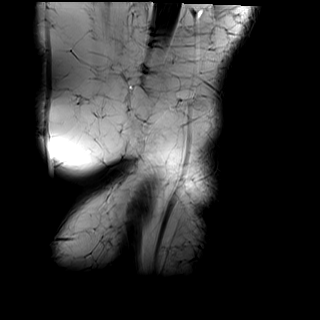

[Series 9: PD fat-sat · coronal · left · 3.0mm · 0.47mm/px · 7 of 28 slices shown (1 of 2)]
[im 1/28]
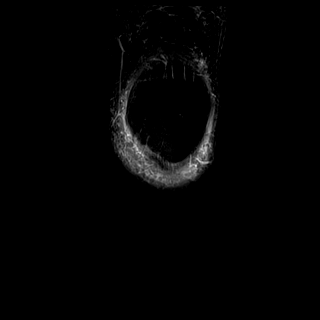
[im 5/28]
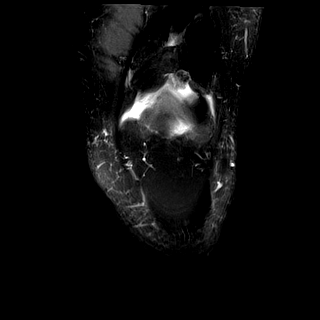
[im 10/28]
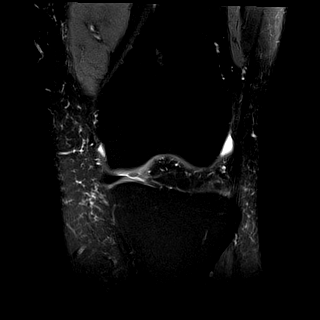
[im 14/28]
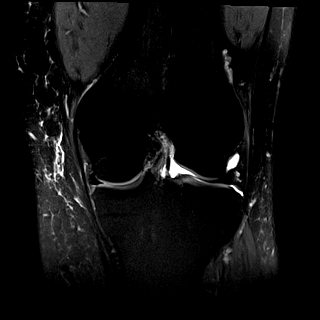
[im 19/28]
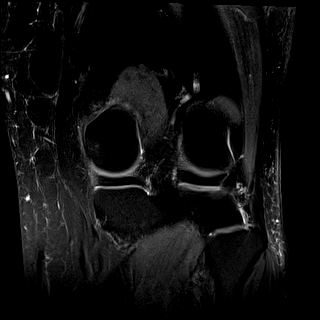
[im 23/28]
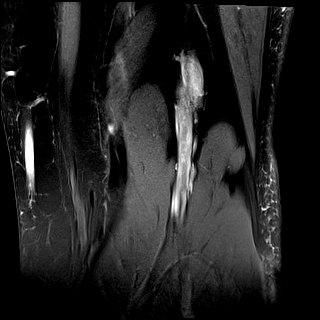
[im 28/28]
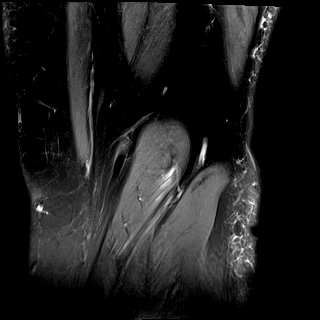

[Series 10: PD fat-sat · sagittal · left · 3.0mm · 0.39mm/px · 6 of 26 slices shown (2 of 2)]
[im 1/26]
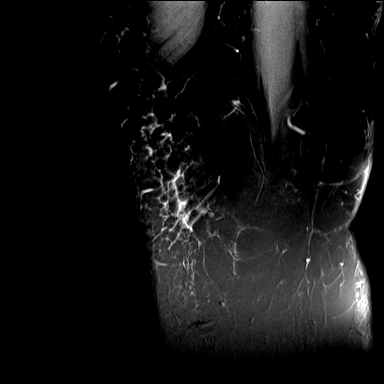
[im 6/26]
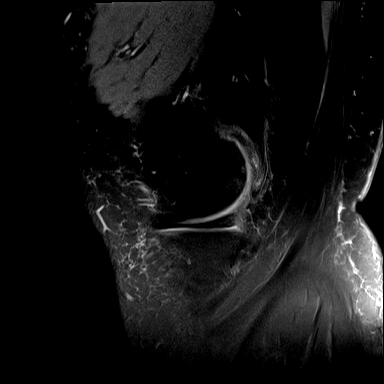
[im 11/26]
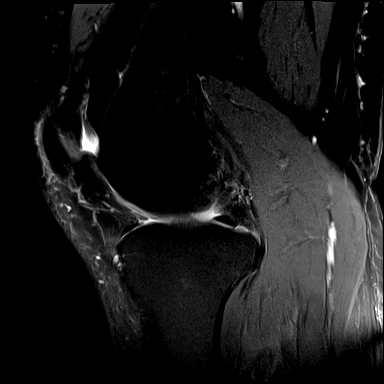
[im 16/26]
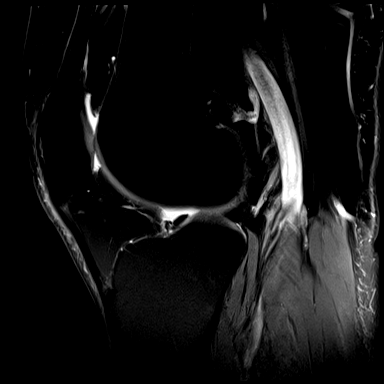
[im 21/26]
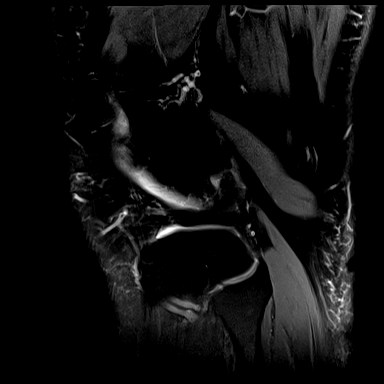
[im 26/26]
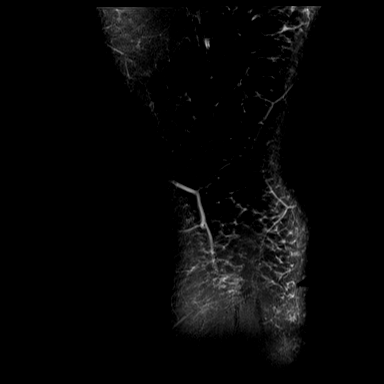

[Series 11: T2 fat-sat · sagittal · left · 3.0mm · 0.39mm/px · 6 of 26 slices shown (3 of 3)]
[im 1/26]
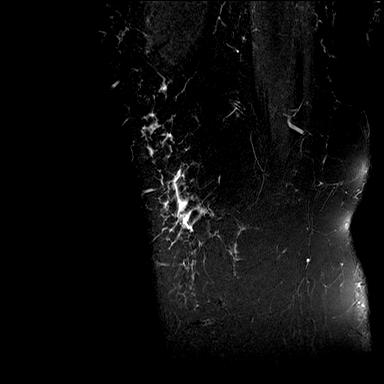
[im 6/26]
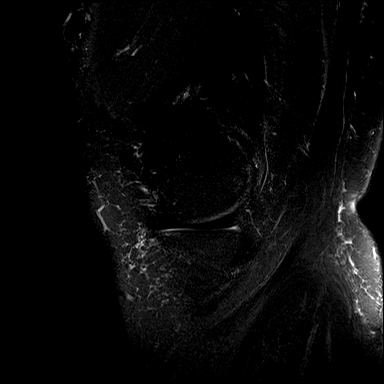
[im 11/26]
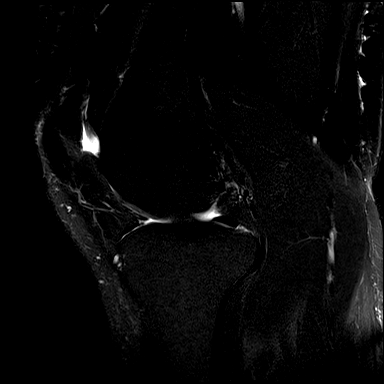
[im 16/26]
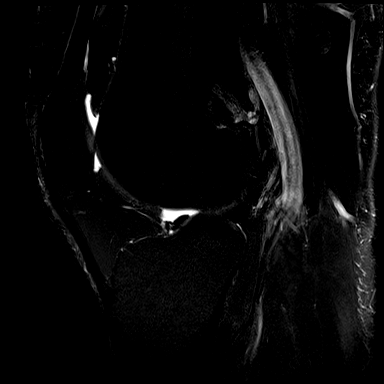
[im 21/26]
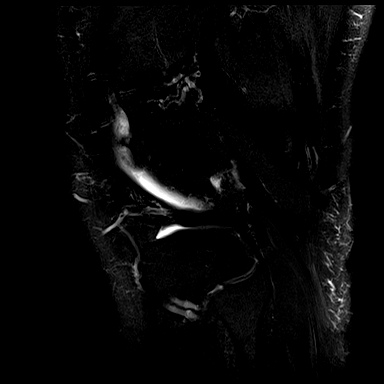
[im 26/26]
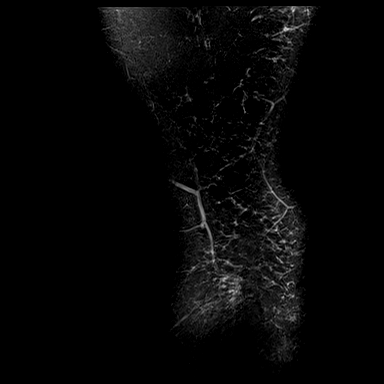

[40 of 40 positions shown; findings below may reference images not displayed]

FINDINGS: MENISCI

Medial: Intact.

Lateral: Intact.

LIGAMENTS

Cruciates: ACL and PCL are intact.

Collaterals: Medial collateral ligament is intact. Lateral
collateral ligament complex is intact.

CARTILAGE

Patellofemoral:  No chondral defect.

Medial:  No chondral defect.

Lateral:  No chondral defect.

JOINT: No joint effusion. Normal HULK. No plical
thickening.

POPLITEAL FOSSA: Popliteus tendon is intact. No Baker's cyst.

EXTENSOR MECHANISM: Intact quadriceps tendon. Intact patellar
tendon. Intact lateral patellar retinaculum. Intact medial patellar
retinaculum. Intact MPFL.

BONES: No aggressive osseous lesion. No fracture or dislocation.

Other: No fluid collection or hematoma. Muscles are normal.
IMPRESSION: 1. No internal derangement of the left knee.
2.  No acute osseous injury of the left knee.

## 2021-04-04 ENCOUNTER — Ambulatory Visit (INDEPENDENT_AMBULATORY_CARE_PROVIDER_SITE_OTHER): Payer: 59 | Admitting: Physical Therapy

## 2021-04-04 ENCOUNTER — Other Ambulatory Visit: Payer: Self-pay | Admitting: Nurse Practitioner

## 2021-04-04 ENCOUNTER — Encounter: Payer: Self-pay | Admitting: Orthopaedic Surgery

## 2021-04-04 ENCOUNTER — Ambulatory Visit (INDEPENDENT_AMBULATORY_CARE_PROVIDER_SITE_OTHER): Payer: 59 | Admitting: Orthopaedic Surgery

## 2021-04-04 ENCOUNTER — Other Ambulatory Visit: Payer: Self-pay

## 2021-04-04 DIAGNOSIS — M6281 Muscle weakness (generalized): Secondary | ICD-10-CM

## 2021-04-04 DIAGNOSIS — M25662 Stiffness of left knee, not elsewhere classified: Secondary | ICD-10-CM

## 2021-04-04 DIAGNOSIS — D508 Other iron deficiency anemias: Secondary | ICD-10-CM

## 2021-04-04 DIAGNOSIS — M25562 Pain in left knee: Secondary | ICD-10-CM

## 2021-04-04 DIAGNOSIS — G8929 Other chronic pain: Secondary | ICD-10-CM | POA: Diagnosis not present

## 2021-04-04 DIAGNOSIS — R262 Difficulty in walking, not elsewhere classified: Secondary | ICD-10-CM | POA: Diagnosis not present

## 2021-04-04 DIAGNOSIS — M25561 Pain in right knee: Secondary | ICD-10-CM

## 2021-04-04 DIAGNOSIS — M25661 Stiffness of right knee, not elsewhere classified: Secondary | ICD-10-CM

## 2021-04-04 NOTE — Therapy (Addendum)
[facility] [address] [place], [place], [address] Phone: [phone]   Fax:  [phone]  Physical Therapy Treatment  Patient Details  Name: Angela Castillo MRN: [id number] Date of Birth: [birth year] Referring Provider (PT): Jean Rosenthal   Encounter Date: 04/04/2021  PHYSICAL THERAPY DISCHARGE SUMMARY  Visits from Start of Care: 6  Current functional level related to goals / functional outcomes: See note   Remaining deficits: See note   Education / Equipment: HEP   Patient agrees to discharge. Patient goals were partially met. Patient is being discharged due to not returning since the last visit.    Past Medical History:  Diagnosis Date   Anemia Dx January 03 2015   Diabetes mellitus without complication ([place])    Elevated liver enzymes    Essential hypertension Dx Apr 2016   Fatty liver    H/O: upper GI bleed 03/2019    variceal bleed    History of blood transfusion 03/2019   Liver cirrhosis secondary to NASH ([place])    Pneumonia 03/2019   Reflux    Thrombocytopenia ([place])     Past Surgical History:  Procedure Laterality Date   ESOPHAGEAL BANDING  03/22/2019   Procedure: ESOPHAGEAL BANDING;  Surgeon: Wilford Corner, MD;  [facility];  Service: Endoscopy;;   ESOPHAGEAL BANDING  03/23/2019   Procedure: ESOPHAGEAL BANDING;  Surgeon: Wilford Corner, MD;  [facility];  Service: Endoscopy;;   ESOPHAGEAL BANDING  09/20/2019   Procedure: ESOPHAGEAL BANDING;  Surgeon: Wilford Corner, MD;  [facility];  Service: Endoscopy;;   ESOPHAGEAL BANDING N/A 12/21/2019   Procedure: ESOPHAGEAL BANDING;  Surgeon: Wilford Corner, MD;  [facility];  Service: Endoscopy;  Laterality: N/A;   ESOPHAGOGASTRODUODENOSCOPY N/A 03/22/2019   Procedure: ESOPHAGOGASTRODUODENOSCOPY (EGD);  Surgeon: Wilford Corner, MD;  [facility];  Service: Endoscopy;  Laterality: N/A;    ESOPHAGOGASTRODUODENOSCOPY N/A 03/23/2019   Procedure: ESOPHAGOGASTRODUODENOSCOPY (EGD);  Surgeon: Wilford Corner, MD;  [facility];  Service: Endoscopy;  Laterality: N/A;   ESOPHAGOGASTRODUODENOSCOPY (EGD) WITH PROPOFOL N/A 07/23/2019   Procedure: ESOPHAGOGASTRODUODENOSCOPY (EGD) WITH PROPOFOL with possible banding;  Surgeon: Wilford Corner, MD;  [facility];  Service: Endoscopy;  Laterality: N/A;   ESOPHAGOGASTRODUODENOSCOPY (EGD) WITH PROPOFOL N/A 09/20/2019   Procedure: ESOPHAGOGASTRODUODENOSCOPY (EGD) WITH PROPOFOL;  Surgeon: Wilford Corner, MD;  [facility];  Service: Endoscopy;  Laterality: N/A;   ESOPHAGOGASTRODUODENOSCOPY (EGD) WITH PROPOFOL N/A 12/21/2019   Procedure: ESOPHAGOGASTRODUODENOSCOPY (EGD) WITH PROPOFOL with possible banding;  Surgeon: Wilford Corner, MD;  [facility];  Service: Endoscopy;  Laterality: N/A;   TUBAL LIGATION  Jul 25, 2000   VENTRAL HERNIA REPAIR N/A 07/06/2020   Procedure: VENTRAL HERNIA REPAIR WITH MESH;  Surgeon: Coralie Keens, MD;  Location: [facility];  Service: General;  Laterality: N/A;    There were no vitals filed for this visit.                 PT Short Term Goals - 03/28/21 1541       PT SHORT TERM GOAL #1   Title pt will be independent in independent in initial HEP    Time 3    Period Weeks    Status Achieved    Target Date 03/27/21      PT SHORT TERM GOAL #2   Title Pt will report pain of </= 5/10 during work shift.    Baseline currently pain varies    Time 3    Status Achieved  Target Date 03/27/21      PT SHORT TERM GOAL #3   Title -      PT SHORT TERM GOAL #4   Title -               PT Long Term Goals - 03/28/21 1542       PT LONG TERM GOAL #1   Title Pt will be independent in advanced HEP    Time 6    Period Weeks    Status On-going      PT LONG TERM GOAL #2   Title Pt will improve her FOTO to 71% function.    Baseline 57 (was 50)    Time 6     Period Weeks    Status On-going      PT LONG TERM GOAL #3   Title Pt will improve bilateral knee flexion to 5/5 for increaed functional mobility.    Time 6    Period Weeks    Status On-going      PT LONG TERM GOAL #4   Title Pt will be able to perform sit to stand in </= 14 seconds with no UE support with no pain reported.    Time 6    Period Weeks    Status Achieved                     Patient will benefit from skilled therapeutic intervention in order to improve the following deficits and impairments:  Pain, Postural dysfunction, Obesity, Decreased balance, Increased edema, Decreased activity tolerance, Decreased endurance, Decreased strength, Decreased range of motion, Difficulty walking  Visit Diagnosis: Difficulty in walking, not elsewhere classified  Muscle weakness (generalized)  Stiffness of left knee, not elsewhere classified  Stiffness of right knee, not elsewhere classified  Acute pain of left knee  Chronic pain of right knee     Problem List Patient Active Problem List   Diagnosis Date Noted   Ingrown toenail 10/16/2019   Cirrhosis ([place]) 07/23/2019   Esophageal varices ([place]) 07/23/2019   Diabetes mellitus ([place]) 04/20/2019   Acute respiratory insufficiency    Respiratory failure ([place])    Shock circulatory ([place])    H/O: upper GI bleed 03/21/2019   Impingement syndrome of left shoulder 04/21/2017   Low back pain 03/18/2016   Left shoulder pain 03/18/2016   Esophageal reflux 03/18/2016   Lateral pain of left hip 10/25/2015   Heel pain, bilateral 05/03/2015   Obesity (BMI 30-39.9) 05/03/2015   Hearing loss in right ear 03/01/2015   Family history of diabetes mellitus in mother 03/01/2015   Essential hypertension 01/18/2015   Anemia, iron deficiency 01/04/2015   Musculoskeletal chest pain 01/04/2015   Thrombocytopenia ([place]) 01/04/2015   Transaminitis 01/03/2015    Precious Gilding 06/13/2021, 9:58 AM  [facility]  Physical Therapy [address] [place], [place], [address] Phone: [phone]   Fax:  [phone]  Name: Angela Castillo MRN: [id number] Date of Birth: 10-22-1976

## 2021-04-04 NOTE — Progress Notes (Signed)
The patient is a 45 year old comes in for follow-up with chronic bilateral knee pain.  She has failed conservative treatment and at this point MRIs were obtained of both knees.  She had tried anti-inflammatories and injections as well as weight loss and physical therapy.  Both knees examined today show no effusion.  Both knees have pain throughout the arc of motion but no instability on exam.  I did share with her the MRI report of both knees.  Both knees showed no internal derangement at all.  There is no evidence of meniscal or ligamentous tear.  There is no effusion.  The bone quality appears normal and there is no cartilage defects in either knee at all.  I gave her reassurance that her knees are clinically sound and mechanically sound.  There is nothing else that I can recommend for her knees from a pain standpoint.  All question concerns were answered addressed.  Follow-up is as needed.

## 2021-04-06 ENCOUNTER — Other Ambulatory Visit: Payer: Self-pay | Admitting: Nurse Practitioner

## 2021-04-06 ENCOUNTER — Encounter: Payer: 59 | Admitting: Rehabilitative and Restorative Service Providers"

## 2021-04-06 NOTE — Telephone Encounter (Signed)
Future appt 1 month

## 2021-04-11 ENCOUNTER — Encounter: Payer: 59 | Admitting: Rehabilitative and Restorative Service Providers"

## 2021-04-11 MED ORDER — FERROUS SULFATE 325 (65 FE) MG PO TABS: 325.0000 mg | ORAL_TABLET | Freq: Two times a day (BID) | ORAL | 3 refills | Status: DC

## 2021-04-11 MED ORDER — CONTOUR NEXT TEST VI STRP: ORAL_STRIP | 6 refills | Status: DC

## 2021-04-11 MED ORDER — MICROLET LANCETS MISC: 11 refills | Status: AC

## 2021-04-11 MED ORDER — LOSARTAN POTASSIUM-HCTZ 50-12.5 MG PO TABS: 1.0000 | ORAL_TABLET | Freq: Every day | ORAL | 1 refills | Status: DC

## 2021-04-11 MED ORDER — BENZONATATE 200 MG PO CAPS: ORAL_CAPSULE | ORAL | 0 refills | Status: DC

## 2021-04-13 ENCOUNTER — Encounter: Payer: 59 | Admitting: Rehabilitative and Restorative Service Providers"

## 2021-04-19 ENCOUNTER — Encounter: Payer: Self-pay | Admitting: Nurse Practitioner

## 2021-05-04 ENCOUNTER — Encounter: Payer: Self-pay | Admitting: Nurse Practitioner

## 2021-05-04 ENCOUNTER — Other Ambulatory Visit: Payer: Self-pay | Admitting: Nurse Practitioner

## 2021-05-04 DIAGNOSIS — IMO0002 Reserved for concepts with insufficient information to code with codable children: Secondary | ICD-10-CM

## 2021-05-08 ENCOUNTER — Ambulatory Visit: Payer: 59 | Admitting: Nurse Practitioner

## 2021-05-11 ENCOUNTER — Ambulatory Visit: Payer: 59 | Attending: Nurse Practitioner

## 2021-05-11 ENCOUNTER — Other Ambulatory Visit: Payer: Self-pay | Admitting: Nurse Practitioner

## 2021-05-11 ENCOUNTER — Other Ambulatory Visit: Payer: Self-pay

## 2021-05-11 DIAGNOSIS — E1165 Type 2 diabetes mellitus with hyperglycemia: Secondary | ICD-10-CM

## 2021-05-11 DIAGNOSIS — IMO0002 Reserved for concepts with insufficient information to code with codable children: Secondary | ICD-10-CM

## 2021-05-11 NOTE — Telephone Encounter (Signed)
   Notes to clinic:  Review for continued use  Short term supply given    Requested Prescriptions  Pending Prescriptions Disp Refills   benzonatate (TESSALON) 200 MG capsule [Pharmacy Med Name: BENZONATATE 200 MG CAPSULE] 20 capsule 0    Sig: TAKE 1 CAPSULE BY MOUTH 2 TIMES DAILY AS NEEDED FOR COUGH      Ear, Nose, and Throat:  Antitussives/Expectorants Passed - 05/11/2021  9:03 AM      Passed - Valid encounter within last 12 months    Recent Outpatient Visits           3 months ago Type 2 diabetes mellitus without complication, without long-term current use of insulin ([place])   [place], [place] W, NP   6 months ago Diabetes mellitus type 2, uncontrolled, with complications [facility])   [place], Vernia Buff, NP   6 months ago Essential hypertension   [place], Jarome Matin, RPH-CPP   11 months ago Essential hypertension   [place], Vernia Buff, NP   1 year ago Essential hypertension   [place], [place], RPH-CPP       Future Appointments             In 1 month Gildardo Pounds, NP [place]

## 2021-05-12 ENCOUNTER — Other Ambulatory Visit: Payer: Self-pay | Admitting: Nurse Practitioner

## 2021-05-12 ENCOUNTER — Encounter: Payer: Self-pay | Admitting: Nurse Practitioner

## 2021-05-12 LAB — HEMOGLOBIN A1C
Est. average glucose Bld gHb Est-mCnc: 174 mg/dL
Hgb A1c MFr Bld: 7.7 % — ABNORMAL HIGH (ref 4.8–5.6)

## 2021-05-12 MED ORDER — DAPAGLIFLOZIN PROPANEDIOL 5 MG PO TABS
5.0000 mg | ORAL_TABLET | Freq: Every day | ORAL | 0 refills | Status: DC
  Filled 2021-05-12: qty 30, 30d supply, fill #0
  Filled 2021-06-06 – 2021-06-07 (×2): qty 30, 30d supply, fill #1
  Filled 2021-07-10: qty 30, 30d supply, fill #2

## 2021-05-14 ENCOUNTER — Other Ambulatory Visit: Payer: Self-pay

## 2021-05-15 ENCOUNTER — Other Ambulatory Visit: Payer: Self-pay

## 2021-05-15 ENCOUNTER — Other Ambulatory Visit: Payer: Self-pay | Admitting: Nurse Practitioner

## 2021-05-18 ENCOUNTER — Encounter: Payer: Self-pay | Admitting: Nurse Practitioner

## 2021-05-18 ENCOUNTER — Other Ambulatory Visit: Payer: Self-pay | Admitting: Nurse Practitioner

## 2021-05-18 MED ORDER — MUPIROCIN CALCIUM 2 % EX CREA: 1.0000 "application " | TOPICAL_CREAM | Freq: Two times a day (BID) | CUTANEOUS | 1 refills | Status: DC

## 2021-05-18 NOTE — Telephone Encounter (Signed)
  Notes to clinic: Alternative Requested:NOT COVERED BY INSURANCE. OVER $700   Requested Prescriptions  Pending Prescriptions Disp Refills   mupirocin ointment (BACTROBAN) 2 % [Pharmacy Med Name: MUPIROCIN 2% OINTMENT]  0      Off-Protocol Failed - 05/18/2021  1:35 PM      Failed - Medication not assigned to a protocol, review manually.      Passed - Valid encounter within last 12 months    Recent Outpatient Visits           3 months ago Type 2 diabetes mellitus without complication, without long-term current use of insulin ([place])   [place], [place] W, NP   6 months ago Diabetes mellitus type 2, uncontrolled, with complications [facility])   [place], Vernia Buff, NP   6 months ago Essential hypertension   [place], RPH-CPP   11 months ago Essential hypertension   [place], Vernia Buff, NP   1 year ago Essential hypertension   [place], RPH-CPP       Future Appointments             In 1 month Gildardo Pounds, NP [place]

## 2021-05-22 ENCOUNTER — Encounter: Payer: Self-pay | Admitting: Nurse Practitioner

## 2021-05-24 ENCOUNTER — Other Ambulatory Visit: Payer: Self-pay | Admitting: Nurse Practitioner

## 2021-05-24 DIAGNOSIS — R519 Headache, unspecified: Secondary | ICD-10-CM

## 2021-05-24 NOTE — Telephone Encounter (Signed)
Requested medication (s) are due for refill today:   Yes  Requested medication (s) are on the active medication list:   Yes  Future visit scheduled:   Yes   Last ordered: 03/25/2021 #90, 0 refill  Returned because no more refills remain on this Rx.   Needs new Rx.   Requested Prescriptions  Pending Prescriptions Disp Refills   amitriptyline (ELAVIL) 25 MG tablet [Pharmacy Med Name: AMITRIPTYLINE 25MG TABLETS] 90 tablet 0    Sig: TAKE 1 TABLET(25 MG) BY MOUTH AT BEDTIME      Psychiatry:  Antidepressants - Heterocyclics (TCAs) Passed - 05/24/2021  8:04 AM      Passed - Valid encounter within last 6 months    Recent Outpatient Visits           3 months ago Type 2 diabetes mellitus without complication, without long-term current use of insulin [facility])   [place], [place] W, NP   6 months ago Diabetes mellitus type 2, uncontrolled, with complications [facility])   [place], Vernia Buff, NP   7 months ago Essential hypertension   [place], Jarome Matin, RPH-CPP   11 months ago Essential hypertension   [place], Vernia Buff, NP   1 year ago Essential hypertension   [place], [place], RPH-CPP       Future Appointments             In 1 month Gildardo Pounds, NP [place]

## 2021-06-06 ENCOUNTER — Other Ambulatory Visit: Payer: Self-pay

## 2021-06-07 ENCOUNTER — Other Ambulatory Visit: Payer: Self-pay

## 2021-06-08 ENCOUNTER — Other Ambulatory Visit: Payer: Self-pay | Admitting: Nurse Practitioner

## 2021-06-08 ENCOUNTER — Other Ambulatory Visit: Payer: Self-pay

## 2021-06-12 ENCOUNTER — Other Ambulatory Visit: Payer: Self-pay | Admitting: Family Medicine

## 2021-06-12 NOTE — Telephone Encounter (Signed)
Requested medications are due for refill today.  yes  Requested medications are on the active medications list.  yes  Last refill. 05/18/2021  Future visit scheduled.   yes  Notes to clinic.  No protocol for this medication.

## 2021-06-20 ENCOUNTER — Other Ambulatory Visit: Payer: Self-pay | Admitting: Nurse Practitioner

## 2021-06-20 DIAGNOSIS — Z1231 Encounter for screening mammogram for malignant neoplasm of breast: Secondary | ICD-10-CM

## 2021-06-26 ENCOUNTER — Ambulatory Visit: Payer: 59 | Admitting: Nurse Practitioner

## 2021-06-26 NOTE — Assessment & Plan Note (Signed)
Originally started August 2015: Platelet count 130 01/03/2015: Hemoglobin 6.4: Required blood transfusion and was found to be iron deficient 05/01/2017: Platelet count relatively stable 147 September 2019: Platelet count 110 03/21/2019: Platelet count 94during hospitalization for cirrhosis and gastric variceal bleeding status post banding complicated by aspiration pneumonia 03/29/2019 at the time of discharge platelet count 104 06/25/2019: Platelet count 74 06/13/2020: Platelets 107 02/09/2021: Platelets 86  Differential diagnosis: Cirrhosis and splenomegaly related thrombocytopenia versus ITP Previous work-up has been negative hepatitis B, hepatitis C, HIV  Patient has chronic mild thrombocytopenia around 100-130 range probably due to cirrhosis.  Platelet counts are relatively stable. Continue watchful monitoring with a follow-up in 1 year.

## 2021-06-26 NOTE — Progress Notes (Signed)
Patient Care Team: Gildardo Pounds, NP as PCP - General (Nurse Practitioner)  DIAGNOSIS:    ICD-10-CM   1. Thrombocytopenia ([place])  D69.6       CHIEF COMPLIANT: Follow-up of thrombocytopenia  INTERVAL HISTORY: Angela Castillo is a 45 y.o. with above-mentioned history of thrombocytopenia. Labs on 06/13/20 showed Hg 11.0, HCT 35.5, platelets 86. She presents to the clinic today for follow-up.  She denies any problems with bruising or bleeding.  She has a dog and a cat which seem to scratch her a lot.  ALLERGIES:  is allergic to nsaids.  MEDICATIONS:  Current Outpatient Medications  Medication Sig Dispense Refill   acetaminophen (TYLENOL) 500 MG tablet Take 500-1,000 mg by mouth every 6 (six) hours as needed for mild pain.     albuterol (VENTOLIN HFA) 108 (90 Base) MCG/ACT inhaler INHALE 2 PUFFS INTO THE LUNGS EVERY 6 HOURS AS NEEDED FOR COUGH 18 each 0   amitriptyline (ELAVIL) 25 MG tablet TAKE 1 TABLET(25 MG) BY MOUTH AT BEDTIME 90 tablet 0   benzonatate (TESSALON) 200 MG capsule TAKE 1 CAPSULE BY MOUTH 2 TIMES DAILY AS NEEDED FOR COUGH 20 capsule 0   blood glucose meter kit and supplies KIT Dispense based on patient and insurance preference. Use up to four times daily as directed. (FOR ICD-9 250.00, 250.01). Dispense [place]ion 1 each 0   dapagliflozin propanediol (FARXIGA) 5 MG TABS tablet Take 1 tablet (5 mg total) by mouth daily before breakfast. 90 tablet 0   ferrous sulfate 325 (65 FE) MG tablet Take 1 tablet (325 mg total) by mouth 2 (two) times daily with a meal. 180 tablet 3   fluticasone (FLONASE) 50 MCG/ACT nasal spray SHAKE LIQUID AND USE 1 SPRAY IN EACH NOSTRIL DAILY AS NEEDED FOR ALLERGIES OR EAR PRESSURE 16 g 1   glucose blood (CONTOUR NEXT TEST) test strip USE UP TO FOUR TIMES DAILY AS DIRECTED 100 strip 6   losartan-hydrochlorothiazide (HYZAAR) 50-12.5 MG tablet Take 1 tablet by mouth daily. 90 tablet 1   losartan-hydrochlorothiazide (HYZAAR) 50-12.5 MG tablet TAKE  1 TABLET BY MOUTH EVERY DAY 90 tablet 1   metFORMIN (GLUCOPHAGE) 500 MG tablet Take 1 tablet (500 mg total) by mouth 2 (two) times daily with a meal. (Patient not taking: Reported on 03/06/2021) 180 tablet 1   Microlet Lancets MISC USE UP TO FOUR TIMES DAILY AS DIRECTED 100 each 11   mupirocin ointment (BACTROBAN) 2 % APPLY TOPICALLY TWICE A DAY 22 g 0   Norgestimate-Ethinyl Estradiol Triphasic (TRI-ESTARYLLA) 0.18/0.215/0.25 MG-35 MCG tablet Take 1 tablet by mouth daily. (Patient not taking: Reported on 03/06/2021) 84 tablet 3   pantoprazole (PROTONIX) 40 MG tablet Take 1 tablet (40 mg total) by mouth daily. 90 tablet 1   propranolol (INDERAL) 20 MG tablet Take 1 tablet (20 mg total) by mouth 2 (two) times daily. 180 tablet 1   No current facility-administered medications for this visit.    PHYSICAL EXAMINATION: ECOG PERFORMANCE STATUS: 1 - Symptomatic but completely ambulatory  Vitals:   06/27/21 1539  BP: 117/77  Pulse: 74  Resp: 18  Temp: 97.7 F (36.5 C)  SpO2: 98%   Filed Weights   06/27/21 1539  Weight: 240 lb 9.6 oz (109.1 kg)     LABORATORY DATA:  I have reviewed the data as listed CMP Latest Ref Rng & Units 11/07/2020 07/06/2020 06/13/2020  Glucose 65 - 99 mg/dL 75 94 93  BUN 6 - 24 mg/dL 10 10 10  Creatinine 0.57 - 1.00 mg/dL 0.65 0.62 0.65  Sodium 134 - 144 mmol/L 139 137 140  Potassium 3.5 - 5.2 mmol/L 3.8 3.6 4.0  Chloride 96 - 106 mmol/L 99 101 101  CO2 20 - 29 mmol/L 27 25 25   Calcium 8.7 - 10.2 mg/dL 9.4 9.1 9.5  Total Protein 6.0 - 8.5 g/dL 8.1 8.2(H) -  Total Bilirubin 0.0 - 1.2 mg/dL 0.5 0.6 -  Alkaline Phos 44 - 121 IU/L 87 72 -  AST 0 - 40 IU/L 24 21 -  ALT 0 - 32 IU/L 21 25 -    Lab Results  Component Value Date   WBC 5.9 06/27/2021   HGB 12.7 06/27/2021   HCT 39.7 06/27/2021   MCV 86.1 06/27/2021   PLT 100 (L) 06/27/2021   NEUTROABS 3.6 06/27/2021    ASSESSMENT & PLAN:  Thrombocytopenia ([place]) Originally started August 2015: Platelet count  130 01/03/2015: Hemoglobin 6.4: Required blood transfusion and was found to be iron deficient 05/01/2017: Platelet count relatively stable 147 September 2019: Platelet count 110 03/21/2019: Platelet count 94 during hospitalization for cirrhosis and gastric variceal bleeding status post banding complicated by aspiration pneumonia 03/29/2019 at the time of discharge platelet count 104 06/25/2019: Platelet count 74 06/13/2020: Platelets 107 06/27/2021: Platelets 100   Differential diagnosis: Cirrhosis and splenomegaly related thrombocytopenia versus ITP Previous work-up has been negative hepatitis B, hepatitis C, HIV   Patient has chronic mild thrombocytopenia around 100-130 range probably due to cirrhosis.  Platelet counts are relatively stable. Continue watchful monitoring with a follow-up in 1 year.    No orders of the defined types were placed in this encounter.  The patient has a good understanding of the overall plan. she agrees with it. she will call with any problems that may develop before the next visit here.  Total time spent: 20 mins including face to face time and time spent for planning, charting and coordination of care  Rulon Eisenmenger, MD, MPH 06/27/2021  I, Thana Ates, am acting as scribe for Dr. Nicholas Lose.  I have reviewed the above documentation for accuracy and completeness, and I agree with the above.

## 2021-06-27 ENCOUNTER — Other Ambulatory Visit: Payer: Self-pay

## 2021-06-27 ENCOUNTER — Inpatient Hospital Stay: Payer: 59 | Attending: Hematology and Oncology | Admitting: Hematology and Oncology

## 2021-06-27 ENCOUNTER — Inpatient Hospital Stay: Payer: 59

## 2021-06-27 DIAGNOSIS — D696 Thrombocytopenia, unspecified: Secondary | ICD-10-CM

## 2021-06-27 DIAGNOSIS — R161 Splenomegaly, not elsewhere classified: Secondary | ICD-10-CM | POA: Diagnosis not present

## 2021-06-27 DIAGNOSIS — Z886 Allergy status to analgesic agent status: Secondary | ICD-10-CM | POA: Diagnosis not present

## 2021-06-27 DIAGNOSIS — Z79899 Other long term (current) drug therapy: Secondary | ICD-10-CM | POA: Diagnosis not present

## 2021-06-27 DIAGNOSIS — K7460 Unspecified cirrhosis of liver: Secondary | ICD-10-CM | POA: Insufficient documentation

## 2021-06-27 LAB — CBC WITH DIFFERENTIAL (CANCER CENTER ONLY)
Abs Immature Granulocytes: 0.02 10*3/uL (ref 0.00–0.07)
Basophils Absolute: 0 10*3/uL (ref 0.0–0.1)
Basophils Relative: 0 %
Eosinophils Absolute: 0.1 10*3/uL (ref 0.0–0.5)
Eosinophils Relative: 2 %
HCT: 39.7 % (ref 36.0–46.0)
Hemoglobin: 12.7 g/dL (ref 12.0–15.0)
Immature Granulocytes: 0 %
Lymphocytes Relative: 30 %
Lymphs Abs: 1.8 10*3/uL (ref 0.7–4.0)
MCH: 27.5 pg (ref 26.0–34.0)
MCHC: 32 g/dL (ref 30.0–36.0)
MCV: 86.1 fL (ref 80.0–100.0)
Monocytes Absolute: 0.4 10*3/uL (ref 0.1–1.0)
Monocytes Relative: 6 %
Neutro Abs: 3.6 10*3/uL (ref 1.7–7.7)
Neutrophils Relative %: 62 %
Platelet Count: 100 10*3/uL — ABNORMAL LOW (ref 150–400)
RBC: 4.61 MIL/uL (ref 3.87–5.11)
RDW: 15.2 % (ref 11.5–15.5)
WBC Count: 5.9 10*3/uL (ref 4.0–10.5)
nRBC: 0 % (ref 0.0–0.2)

## 2021-07-03 ENCOUNTER — Other Ambulatory Visit: Payer: Self-pay

## 2021-07-03 ENCOUNTER — Encounter: Payer: Self-pay | Admitting: Podiatry

## 2021-07-03 ENCOUNTER — Ambulatory Visit (INDEPENDENT_AMBULATORY_CARE_PROVIDER_SITE_OTHER): Payer: 59 | Admitting: Podiatry

## 2021-07-03 DIAGNOSIS — L600 Ingrowing nail: Secondary | ICD-10-CM

## 2021-07-03 DIAGNOSIS — B351 Tinea unguium: Secondary | ICD-10-CM

## 2021-07-03 NOTE — Progress Notes (Signed)
Subjective: 45 year old female presents the office today requesting her right big toenail be removed.  She states going back and thickened discolored again.  No swelling redness or any drainage.  No significant pain today.  Objective: AAO x3, NAD DP/PT pulses palpable bilaterally, CRT less than 3 seconds Right hallux toenails hypertrophic, dystrophic with yellow-brown discoloration.  Incurvation of the medial nail nail borders.  No significant pain.  No edema, erythema or signs of infection. No pain with calf compression, swelling, warmth, erythema  Assessment: Right hallux ingrown toenail, onychodystrophy  Plan: -All treatment options discussed with the patient including all alternatives, risks, complications.  -I discussed with her surgical removal versus trying treat the nail.  After discussion she wants to have the nail removed.  Discussed pros and cons of doing this on a repeated basis and she understands this. -At this time, patient is requesting total nail removal without chemical matricectomy to the right due to ingrown nail/dystrophy. Risks and complications were discussed with the patient for which they understand and  verbally consent to the procedure. Under sterile conditions a total of 3 mL of a mixture of 2% lidocaine plain and 0.5% Marcaine plain was infiltrated in a hallux block fashion. Once anesthetized, the skin was prepped in sterile fashion. A tourniquet was then applied. Next the right hallux toenail was removed in total making sure to remove the entire offending nail border. Once the nail was  Removed, the area was debrided and the underlying skin was intact. The area was irrigated and hemostasis was obtained.  A dry sterile dressing was applied. After application of the dressing the tourniquet was removed and there is found to be an immediate capillary refill time to the digit. The patient tolerated the procedure well any complications. Post procedure instructions were  discussed the patient for which he verbally understood. Follow-up in one week for nail check or sooner if any problems are to arise. Discussed signs/symptoms of worsening infection and directed to call the office immediately should any occur or go directly to the emergency room. In the meantime, encouraged to call the office with any questions, concerns, changes symptoms. -Patient encouraged to call the office with any questions, concerns, change in symptoms.   Trula Slade DPM

## 2021-07-11 ENCOUNTER — Other Ambulatory Visit: Payer: Self-pay

## 2021-07-16 ENCOUNTER — Encounter: Payer: Self-pay | Admitting: Podiatry

## 2021-07-16 ENCOUNTER — Other Ambulatory Visit: Payer: Self-pay

## 2021-07-16 ENCOUNTER — Ambulatory Visit (INDEPENDENT_AMBULATORY_CARE_PROVIDER_SITE_OTHER): Payer: 59 | Admitting: Podiatry

## 2021-07-16 DIAGNOSIS — L600 Ingrowing nail: Secondary | ICD-10-CM

## 2021-07-18 ENCOUNTER — Other Ambulatory Visit: Payer: Self-pay

## 2021-07-18 ENCOUNTER — Ambulatory Visit: Payer: 59 | Attending: Nurse Practitioner | Admitting: Nurse Practitioner

## 2021-07-18 DIAGNOSIS — N939 Abnormal uterine and vaginal bleeding, unspecified: Secondary | ICD-10-CM

## 2021-07-18 DIAGNOSIS — K219 Gastro-esophageal reflux disease without esophagitis: Secondary | ICD-10-CM

## 2021-07-18 DIAGNOSIS — E119 Type 2 diabetes mellitus without complications: Secondary | ICD-10-CM

## 2021-07-18 DIAGNOSIS — E785 Hyperlipidemia, unspecified: Secondary | ICD-10-CM | POA: Diagnosis not present

## 2021-07-18 DIAGNOSIS — R102 Pelvic and perineal pain: Secondary | ICD-10-CM

## 2021-07-18 MED ORDER — DAPAGLIFLOZIN PROPANEDIOL 5 MG PO TABS
5.0000 mg | ORAL_TABLET | Freq: Every day | ORAL | 1 refills | Status: DC
  Filled 2021-07-18: qty 90, 90d supply, fill #0
  Filled 2021-08-09: qty 30, 30d supply, fill #0
  Filled 2021-09-13: qty 30, 30d supply, fill #1
  Filled 2021-10-16 – 2021-10-18 (×2): qty 30, 30d supply, fill #2
  Filled 2021-11-05: qty 30, 30d supply, fill #3
  Filled 2021-11-14 – 2021-11-16 (×2): qty 30, 30d supply, fill #0
  Filled 2021-12-13: qty 30, 30d supply, fill #1

## 2021-07-18 MED ORDER — PANTOPRAZOLE SODIUM 40 MG PO TBEC
40.0000 mg | DELAYED_RELEASE_TABLET | Freq: Every day | ORAL | 1 refills | Status: DC
Start: 2021-07-18 — End: 2021-11-09
  Filled 2021-07-18: qty 30, 30d supply, fill #0
  Filled 2021-10-16: qty 90, 90d supply, fill #0

## 2021-07-19 ENCOUNTER — Encounter: Payer: Self-pay | Admitting: Nurse Practitioner

## 2021-07-19 NOTE — Progress Notes (Signed)
Scheduled and called pt to inform her of the date and time.

## 2021-07-19 NOTE — Progress Notes (Signed)
Virtual Visit Note Due to national recommendations of social distancing due to [place] 19, virtual visit is felt to be most appropriate for this patient at this time.  I discussed the limitations, risks, security and privacy concerns of performing an evaluation and management service by video and the availability of in person appointments. I also discussed with the patient that there may be a patient responsible charge related to this service. The patient expressed understanding and agreed to proceed.    I connected with Angela Castillo on 07/19/21  at   4:10 PM EDT  EDT by VIDEO and verified that I am speaking with the correct person using two identifiers.   Location of Patient: Private Residence   Location of Provider: [place] and [employer] Office    Persons participating in VIRTUAL visit: [name] FNP-BC Shakeia Krus    History of Present Illness: VIRTUAL visit for: DM She has a past medical history of Anemia (Dx January 03 2015), DM2, Elevated liver enzymes, Essential hypertension (Dx Apr 2016), Fatty liver, H/O: upper GI bleed (03/2019),  Liver cirrhosis secondary to NASH, Pneumonia (03/2019), Reflux, and Thrombocytopenia.   DM 2 Currently taking farxiga 5 mg daily, metformin 500 mg BID. Denies any symptoms of hypo or hyperglycemia. LDL not quite at goal.  Lab Results  Component Value Date   HGBA1C 7.7 (H) 05/11/2021    Lab Results  Component Value Date   LDLCALC 81 02/09/2021   The 10-year ASCVD risk score (Arnett DK, et al., 2019) is: 3.7%   Values used to calculate the score:     Age: 24 years     Sex: Female     Is Non-Hispanic African American: No     Diabetic: Yes     Tobacco smoker: No     Systolic Blood Pressure: 259 mmHg     Is BP treated: Yes     HDL Cholesterol: 29 mg/dL     Total Cholesterol: 174 mg/dL    BACK PAIN She endorses lumbar and sacral pain. Working on losing weight and increasing activity. Denies any injury or  trauma. Pain is worse with certain positions and prolonged sitting. Taking OTC analgesics for pain.    Past Medical History:  Diagnosis Date   Anemia Dx January 03 2015   Diabetes mellitus without complication ([place])    Elevated liver enzymes    Essential hypertension Dx Apr 2016   Fatty liver    H/O: upper GI bleed 03/2019    variceal bleed    History of blood transfusion 03/2019   Liver cirrhosis secondary to NASH ([place])    Pneumonia 03/2019   Reflux    Thrombocytopenia ([place])     Past Surgical History:  Procedure Laterality Date   ESOPHAGEAL BANDING  03/22/2019   Procedure: ESOPHAGEAL BANDING;  Surgeon: Wilford Corner, MD;  Location: [facility];  Service: Endoscopy;;   ESOPHAGEAL BANDING  03/23/2019   Procedure: ESOPHAGEAL BANDING;  Surgeon: Wilford Corner, MD;  Location: [facility];  Service: Endoscopy;;   ESOPHAGEAL BANDING  09/20/2019   Procedure: ESOPHAGEAL BANDING;  Surgeon: Wilford Corner, MD;  Location: [facility];  Service: Endoscopy;;   ESOPHAGEAL BANDING N/A 12/21/2019   Procedure: ESOPHAGEAL BANDING;  Surgeon: Wilford Corner, MD;  Location: [facility];  Service: Endoscopy;  Laterality: N/A;   ESOPHAGOGASTRODUODENOSCOPY N/A 03/22/2019   Procedure: ESOPHAGOGASTRODUODENOSCOPY (EGD);  Surgeon: Wilford Corner, MD;  Location: [facility];  Service: Endoscopy;  Laterality: N/A;   ESOPHAGOGASTRODUODENOSCOPY N/A 03/23/2019   Procedure: ESOPHAGOGASTRODUODENOSCOPY (  EGD);  Surgeon: Wilford Corner, MD;  Location: [facility];  Service: Endoscopy;  Laterality: N/A;   ESOPHAGOGASTRODUODENOSCOPY (EGD) WITH PROPOFOL N/A 07/23/2019   Procedure: ESOPHAGOGASTRODUODENOSCOPY (EGD) WITH PROPOFOL with possible banding;  Surgeon: Wilford Corner, MD;  Location: [facility];  Service: Endoscopy;  Laterality: N/A;   ESOPHAGOGASTRODUODENOSCOPY (EGD) WITH PROPOFOL N/A 09/20/2019   Procedure: ESOPHAGOGASTRODUODENOSCOPY (EGD) WITH PROPOFOL;  Surgeon: Wilford Corner, MD;   Location: [facility];  Service: Endoscopy;  Laterality: N/A;   ESOPHAGOGASTRODUODENOSCOPY (EGD) WITH PROPOFOL N/A 12/21/2019   Procedure: ESOPHAGOGASTRODUODENOSCOPY (EGD) WITH PROPOFOL with possible banding;  Surgeon: Wilford Corner, MD;  Location: [facility];  Service: Endoscopy;  Laterality: N/A;   TUBAL LIGATION  Jul 25, 2000   VENTRAL HERNIA REPAIR N/A 07/06/2020   Procedure: VENTRAL HERNIA REPAIR WITH MESH;  Surgeon: Coralie Keens, MD;  Location: [place];  Service: General;  Laterality: N/A;    Family History  Problem Relation Age of Onset   COPD Mother    Esophageal cancer Mother    Diabetes Mother    Cancer Mother    Hypertension Mother    Diabetes Father    Breast cancer Maternal Aunt     Social History   Socioeconomic History   Marital status: Married    Spouse name: Not on file   Number of children: Not on file   Years of education: Not on file   Highest education level: Not on file  Occupational History   Not on file  Tobacco Use   Smoking status: Never   Smokeless tobacco: Never  Vaping Use   Vaping Use: Never used  Substance and Sexual Activity   Alcohol use: Yes    Comment: rarely-last alcohol drank in 08/2018   Drug use: No   Sexual activity: Yes  Other Topics Concern   Not on file  Social History Narrative   Not on file   Social Determinants of Health   Financial Resource Strain: Not on file  Food Insecurity: Not on file  Transportation Needs: Not on file  Physical Activity: Not on file  Stress: Not on file  Social Connections: Not on file     Observations/Objective: Awake, alert and oriented x 3   Review of Systems  Constitutional:  Negative for fever, malaise/fatigue and weight loss.  HENT: Negative.  Negative for nosebleeds.   Eyes: Negative.  Negative for blurred vision, double vision and photophobia.  Respiratory: Negative.  Negative for cough and shortness of breath.   Cardiovascular: Negative.  Negative for chest pain,  palpitations and leg swelling.  Gastrointestinal: Negative.  Negative for heartburn, nausea and vomiting.  Musculoskeletal:  Positive for back pain. Negative for myalgias.  Neurological: Negative.  Negative for dizziness, focal weakness, seizures and headaches.  Psychiatric/Behavioral: Negative.  Negative for suicidal ideas.    Assessment and Plan: Diagnoses and all orders for this visit:  Type 2 diabetes mellitus without complication, without long-term current use of insulin (HCC) -     dapagliflozin propanediol (FARXIGA) 5 MG TABS tablet; Take 1 tablet (5 mg total) by mouth daily before breakfast. Continue blood sugar control as discussed in office today, low carbohydrate diet, and regular physical exercise as tolerated, 150 minutes per week (30 min each day, 5 days per week, or 50 min 3 days per week). Keep blood sugar logs with fasting goal of 90-130 mg/dl, post prandial (after you eat) less than 180.  For Hypoglycemia: BS <60 and Hyperglycemia BS >400; contact the clinic ASAP. Annual eye exams and foot  exams are recommended.   GERD without esophagitis -     pantoprazole (PROTONIX) 40 MG tablet; Take 1 tablet (40 mg total) by mouth daily. INSTRUCTIONS: Avoid GERD Triggers: acidic, spicy or fried foods, caffeine, coffee, sodas,  alcohol and chocolate.    Dyslipidemia, goal LDL below 70 INSTRUCTIONS: Work on a low fat, heart healthy diet and participate in regular aerobic exercise program by working out at least 150 minutes per week; 5 days a week-30 minutes per day. Avoid red meat/beef/steak,  fried foods. junk foods, sodas, sugary drinks, unhealthy snacking, alcohol and smoking.  Drink at least 80 oz of water per day and monitor your carbohydrate intake daily.    Abnormal uterine bleeding (AUB) -     US PELVIC COMPLETE WITH TRANSVAGINAL; Future  Pelvic pain -     US PELVIC COMPLETE WITH TRANSVAGINAL; Future    Follow Up Instructions Return in about 2 months (around 09/17/2021).      I discussed the assessment and treatment plan with the patient. The patient was provided an opportunity to ask questions and all were answered. The patient agreed with the plan and demonstrated an understanding of the instructions.   The patient was advised to call back or seek an in-person evaluation if the symptoms worsen or if the condition fails to improve as anticipated.  I provided 30 minutes of face-to-face time during this encounter including median intraservice time, reviewing previous notes, labs, imaging, medications and explaining diagnosis and management.  Gildardo Pounds, FNP-BC

## 2021-07-20 NOTE — Progress Notes (Signed)
Subjective: 45 year old female presents the office today for follow-up evaluation after having right hallux total nail avulsion.  She states that the nail itself has been doing well how she noticed a bruise on the base of the nail.  She denies any drainage or pus.  There is not had any recent injury.  No purulence.  Objective: AAO x3, NAD DP/PT pulses palpable bilaterally, CRT less than 3 seconds Status post total nail avulsion the nail bed itself appears to be healed.  Small bruises on the proximal nail fold.  No edema, erythema, drainage or pus or any obvious signs of infection. No pain with calf compression, swelling, warmth, erythema  Assessment: 45 year old female status post total nail avulsion, healing well; small bruise  Plan: -All treatment options discussed with the patient including all alternatives, risks, complications.  -I recommend continue soaking Epson salts current antibiotic ointment and a Band-Aid on the day but leave the area open at nighttime.  I think the bruises come from the trauma of the nail removed but will monitor.  No signs of infection today we will continue to monitor. -Patient encouraged to call the office with any questions, concerns, change in symptoms.   Trula Slade DPM

## 2021-07-31 ENCOUNTER — Other Ambulatory Visit: Payer: Self-pay

## 2021-07-31 ENCOUNTER — Ambulatory Visit (HOSPITAL_COMMUNITY)
Admission: RE | Admit: 2021-07-31 | Discharge: 2021-07-31 | Disposition: A | Discharge status: Home / Self Care | Payer: 59 | Source: Ambulatory Visit | Attending: Nurse Practitioner | Admitting: Nurse Practitioner

## 2021-07-31 DIAGNOSIS — N939 Abnormal uterine and vaginal bleeding, unspecified: Secondary | ICD-10-CM | POA: Diagnosis present

## 2021-07-31 DIAGNOSIS — R102 Pelvic and perineal pain: Secondary | ICD-10-CM | POA: Diagnosis present

## 2021-07-31 IMAGING — US US PELVIS COMPLETE WITH TRANSVAGINAL
1 series · 14 of 25 positions shown · non-contrast
Comparison: None

CLINICAL DATA: Abnormal uterine bleeding, lower pelvic pain, LMP
estimated [AGE] ago



[Series 1: us pelvic complete with transvaginal · 14 of 108 slices shown]
[im 1/108]
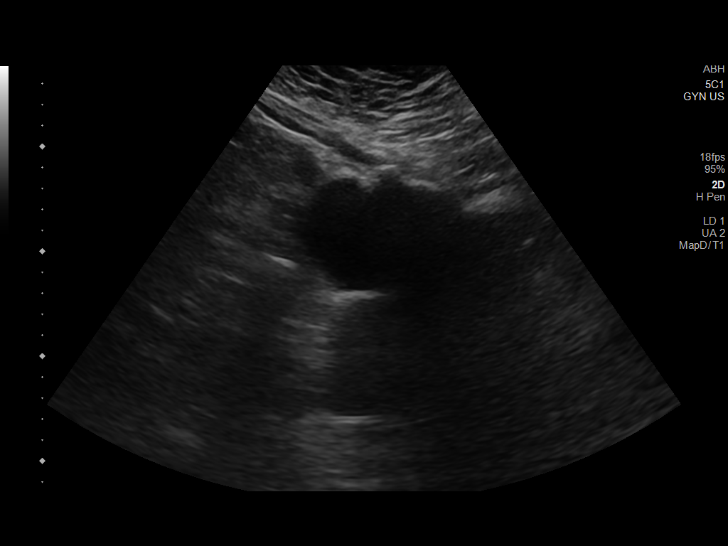
[im 9/108]
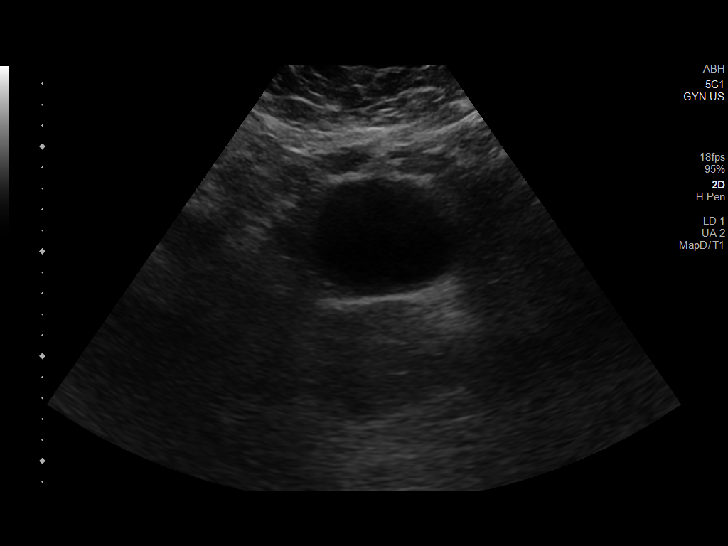
[im 18/108]
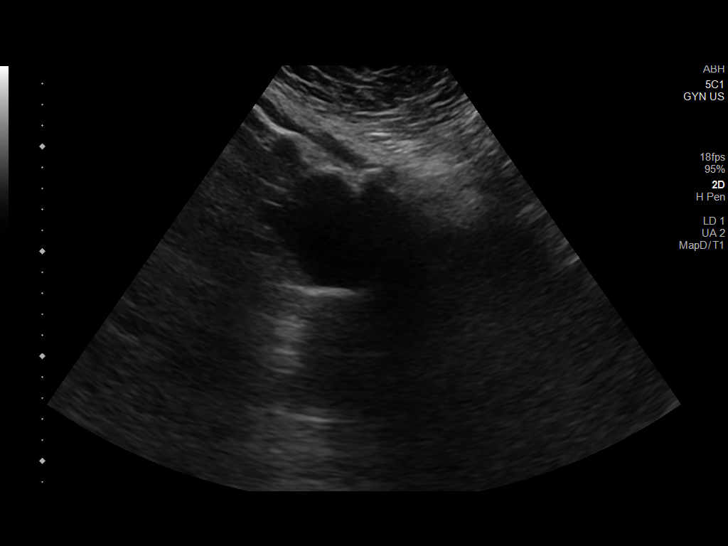
[im 27/108]
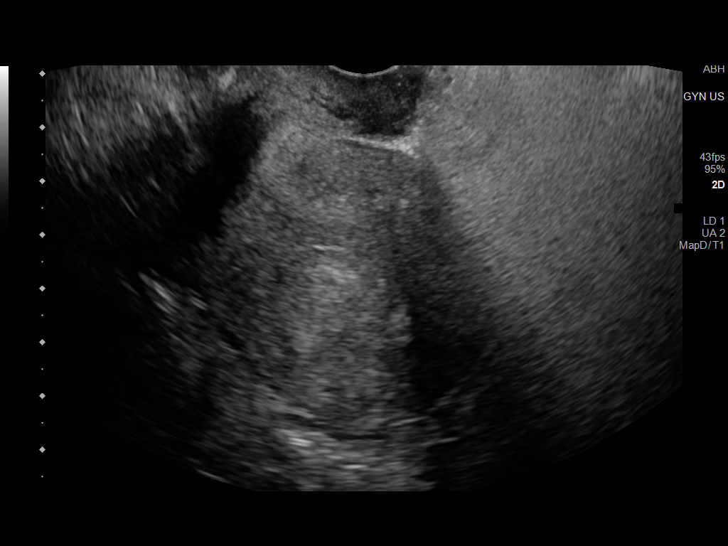
[im 36/108]
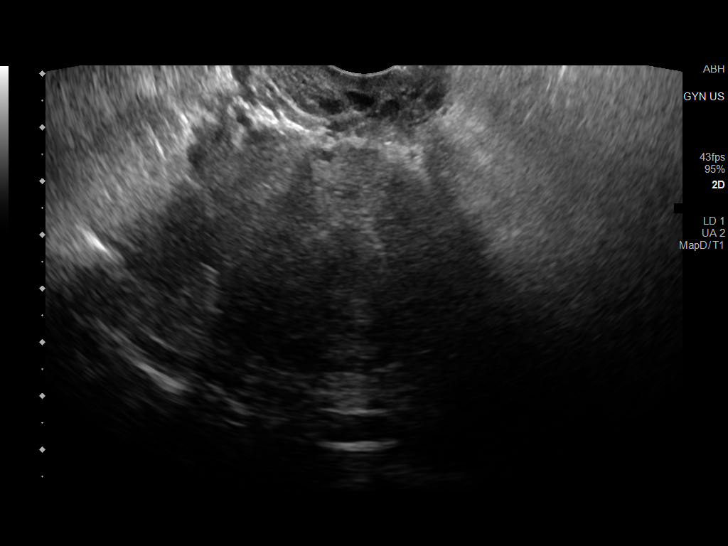
[im 41/108]
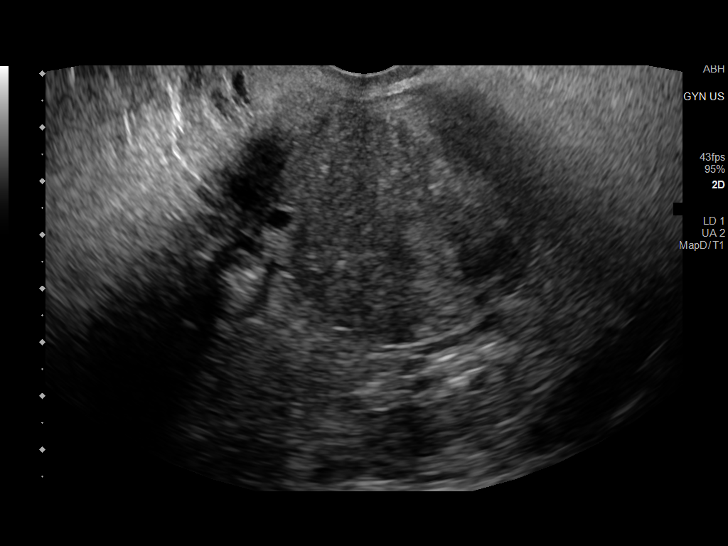
[im 50/108]
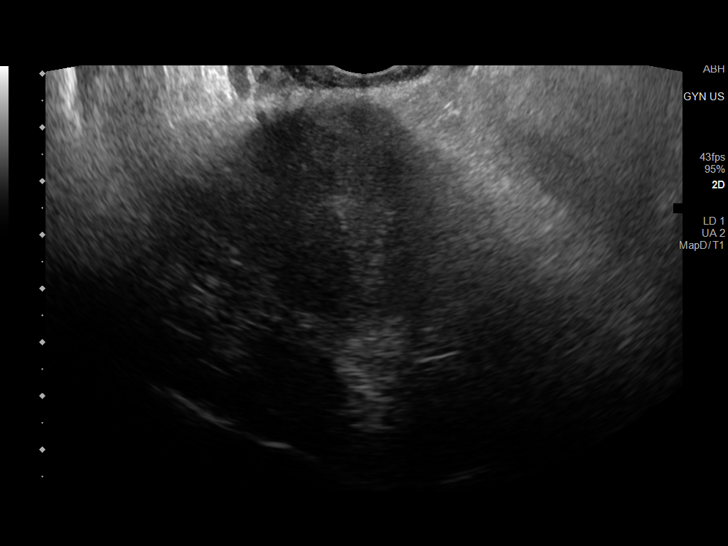
[im 58/108]
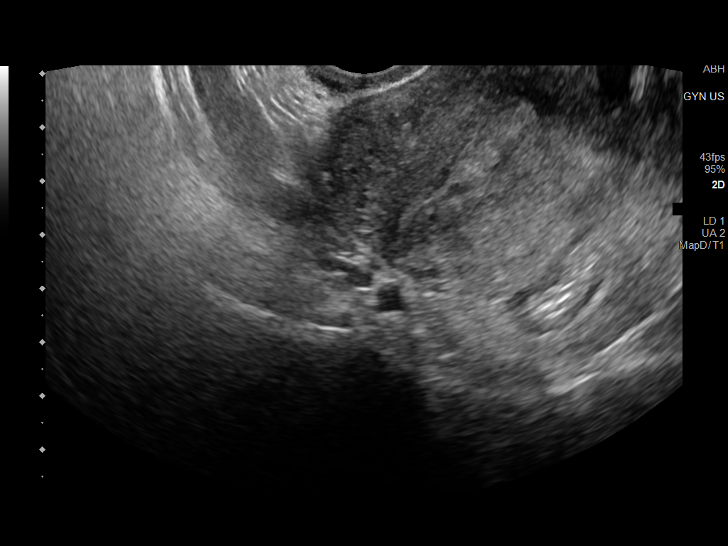
[im 67/108]
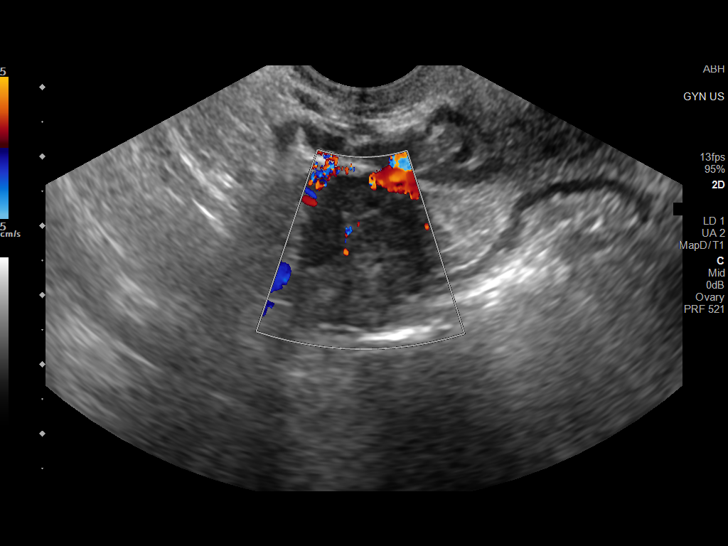
[im 72/108]
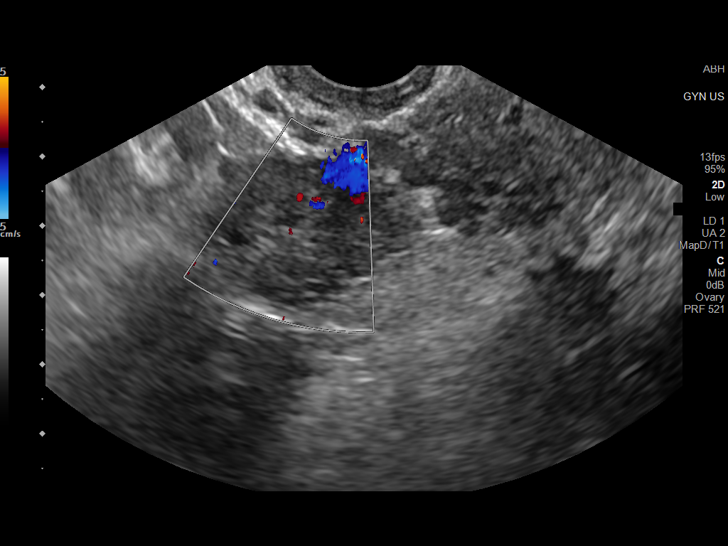
[im 81/108]
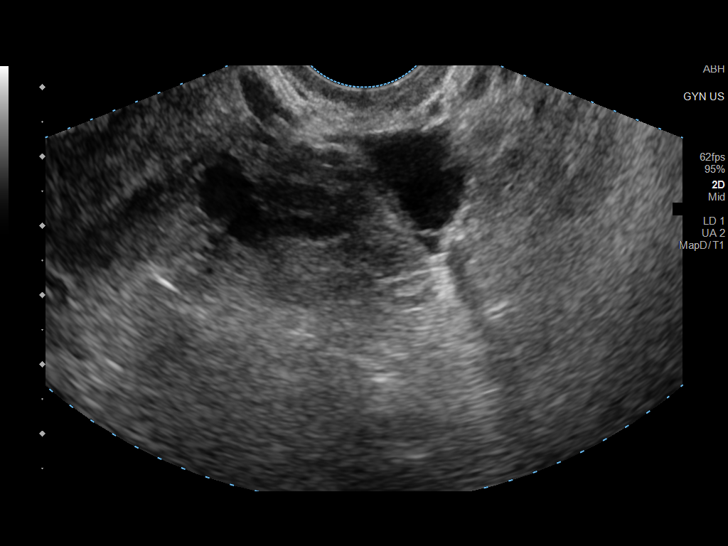
[im 90/108]
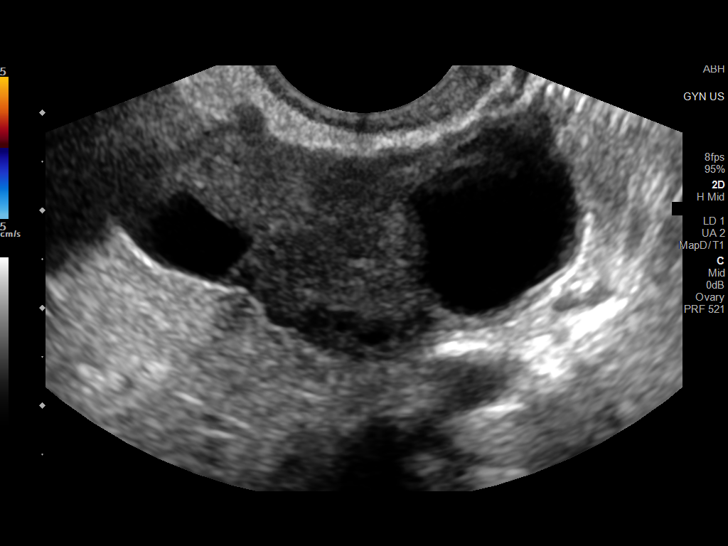
[im 99/108]
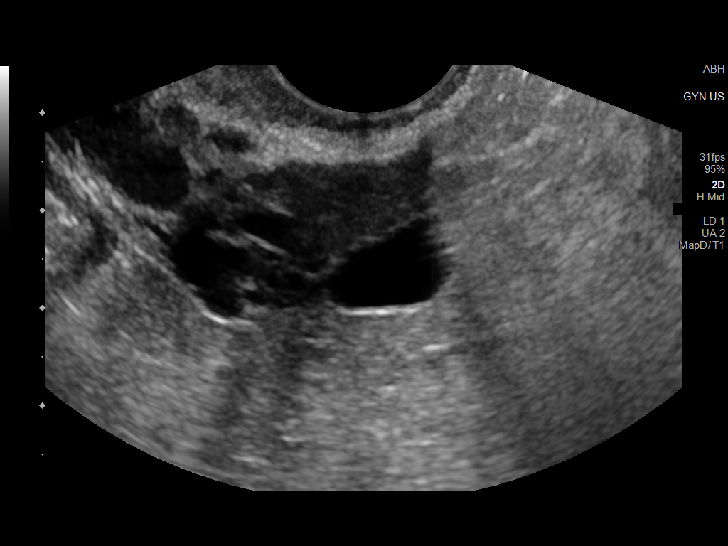
[im 108/108]
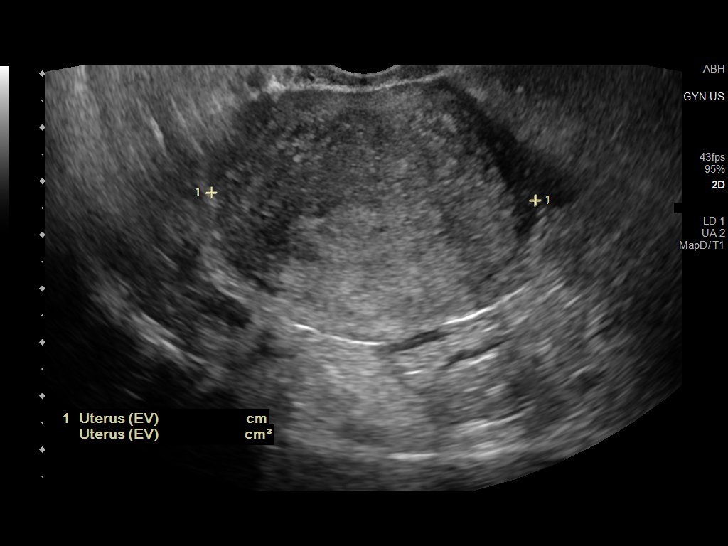

[14 of 25 positions shown; findings below may reference images not displayed]

FINDINGS: Uterus

Measurements: 7.4 x 4.9 x 6.0 cm = volume: 114 mL. Retroverted.
Slightly heterogeneous myometrium. No focal mass.

Endometrium

Thickness: 9 mm.  No endometrial fluid or mass

Right ovary

Measurements: 3.3 x 2.3 x 2.1 cm = volume: 8.2 mL. Normal morphology
without mass

Left ovary

Measurements: 3.1 x 2.0 x 4.7 cm = volume: 15.2 mL. Dominant
physiologic follicle 2.0 cm diameter, normal finding; no follow-up
imaging recommended. No ovarian mass.

Other findings

No free pelvic fluid.  No adnexal masses.
IMPRESSION: Normal exam.

## 2021-08-06 ENCOUNTER — Other Ambulatory Visit: Payer: Self-pay

## 2021-08-06 ENCOUNTER — Ambulatory Visit
Admission: RE | Admit: 2021-08-06 | Discharge: 2021-08-06 | Disposition: A | Discharge status: Home / Self Care | Payer: 59 | Source: Ambulatory Visit | Attending: Nurse Practitioner | Admitting: Nurse Practitioner

## 2021-08-06 DIAGNOSIS — Z1231 Encounter for screening mammogram for malignant neoplasm of breast: Secondary | ICD-10-CM

## 2021-08-09 ENCOUNTER — Other Ambulatory Visit: Payer: Self-pay | Admitting: Nurse Practitioner

## 2021-08-09 ENCOUNTER — Other Ambulatory Visit: Payer: Self-pay

## 2021-08-09 NOTE — Telephone Encounter (Signed)
Requested Prescriptions  Pending Prescriptions Disp Refills  . benzonatate (TESSALON) 200 MG capsule [Pharmacy Med Name: BENZONATATE 200 MG CAPSULE] 20 capsule 0    Sig: TAKE 1 CAPSULE BY MOUTH 2 TIMES DAILY AS NEEDED FOR COUGH     Ear, Nose, and Throat:  Antitussives/Expectorants Passed - 08/09/2021  8:48 AM      Passed - Valid encounter within last 12 months    Recent Outpatient Visits          3 weeks ago Type 2 diabetes mellitus without complication, without long-term current use of insulin ([place])   [place], [place] W, NP   6 months ago Type 2 diabetes mellitus without complication, without long-term current use of insulin [facility])   [place], [place] W, NP   9 months ago Diabetes mellitus type 2, uncontrolled, with complications [facility])   [place], Vernia Buff, NP   9 months ago Essential hypertension   [place], Stephen L, RPH-CPP   1 year ago Essential hypertension   [place], Zelda W, NP      Future Appointments            In 3 weeks Gildardo Pounds, NP [place]

## 2021-08-10 ENCOUNTER — Other Ambulatory Visit: Payer: Self-pay

## 2021-09-05 ENCOUNTER — Ambulatory Visit: Payer: 59 | Attending: Nurse Practitioner | Admitting: Nurse Practitioner

## 2021-09-05 ENCOUNTER — Encounter: Payer: Self-pay | Admitting: Nurse Practitioner

## 2021-09-05 ENCOUNTER — Other Ambulatory Visit: Payer: Self-pay

## 2021-09-05 VITALS — BP 117/74 | HR 75 | Ht 63.5 in | Wt 240.5 lb

## 2021-09-05 DIAGNOSIS — Z23 Encounter for immunization: Secondary | ICD-10-CM | POA: Diagnosis not present

## 2021-09-05 DIAGNOSIS — M545 Low back pain, unspecified: Secondary | ICD-10-CM | POA: Diagnosis not present

## 2021-09-05 DIAGNOSIS — I10 Essential (primary) hypertension: Secondary | ICD-10-CM | POA: Diagnosis not present

## 2021-09-05 DIAGNOSIS — E119 Type 2 diabetes mellitus without complications: Secondary | ICD-10-CM

## 2021-09-05 DIAGNOSIS — E785 Hyperlipidemia, unspecified: Secondary | ICD-10-CM

## 2021-09-05 DIAGNOSIS — G8929 Other chronic pain: Secondary | ICD-10-CM

## 2021-09-05 LAB — GLUCOSE, POCT (MANUAL RESULT ENTRY): POC Glucose: 153 mg/dl — AB (ref 70–99)

## 2021-09-05 LAB — POCT GLYCOSYLATED HEMOGLOBIN (HGB A1C): Hemoglobin A1C: 6.2 % — AB (ref 4.0–5.6)

## 2021-09-05 NOTE — Progress Notes (Signed)
Assessment & Plan:  Amarionna was seen today for diabetes.  Diagnoses and all orders for this visit:  Type 2 diabetes mellitus without complication, without long-term current use of insulin (HCC) -     POCT glucose (manual entry) -     POCT glycosylated hemoglobin (Hb A1C) -     CMP14+EGFR  Dyslipidemia, goal LDL below 70 -     Lipid panel  Essential hypertension Continue all antihypertensives as prescribed.  Remember to bring in your blood pressure log with you for your follow up appointment.  DASH/Mediterranean Diets are healthier choices for HTN.    Need for influenza vaccination -     Flu Vaccine QUAD 68moIM (Fluarix, Fluzone & Alfiuria Quad PF)  Need for pneumococcal vaccination -     Pneumococcal conjugate vaccine 20-valent  Chronic bilateral low back pain without sciatica -     DG Lumbar Spine Complete; Future Work on losing weight to help reduce back pain. May alternate with heat and ice application for pain relief. May also alternate with acetaminophen and Ibuprofen as prescribed for back pain. Other alternatives include massage, acupuncture and water aerobics.  You must stay active and avoid a sedentary lifestyle.    Patient has been counseled on age-appropriate routine health concerns for screening and prevention. These are reviewed and up-to-date. Referrals have been placed accordingly. Immunizations are up-to-date or declined.    Subjective:   Chief Complaint  Patient presents with   Diabetes     MDamali Broadfoot89y.o. female presents to office today for follow up to DM and HTN She has a past medical history of Anemia (Dx January 03 2015), DM2, Elevated liver enzymes, HTN (Dx Apr 2016), Fatty liver, H/O: upper GI bleed (03/2019), History of blood transfusion (03/2019), Liver cirrhosis secondary to NASH ([place], Pneumonia (03/2019), Reflux, and Thrombocytopenia ([place].    DM2 Current medications include Farxiga 5 mg daily and metformin 500 mg twice daily.  Diabetes is well controlled. Weight is stable. She has made significant dietary changes. Needs to work on exercise  Lab Results  Component Value Date   HGBA1C 6.2 (A) 09/05/2021    Lab Results  Component Value Date   HGBA1C 7.7 (H) 05/11/2021    HTN Blood pressure Currently taking losartan-hydrochlorothiazide 50-12.5 mg daily, Inderal 20 mg twice daily BP Readings from Last 3 Encounters:  09/05/21 117/74  06/27/21 117/77  11/07/20 (!) 143/87    Back Pain: Patient presents for presents evaluation of low back problems.  Symptoms have been present for several months and include  pain and stiffness in the thoracic and lumbar spine . Initial inciting event: none. Alleviating factors identifiable by patient are none. Exacerbating factors identifiable by patient are bending backwards, bending forwards, bending sideways, recumbency, sitting, standing, and walking. Treatments so far initiated by patient: none Previous lower back problems:  yes . Previous workup: Sclerotic osteophyte left SI joint as well as OA of the SI joints on hip XRAY.   Review of Systems  Constitutional:  Negative for fever, malaise/fatigue and weight loss.  HENT: Negative.  Negative for nosebleeds.   Eyes: Negative.  Negative for blurred vision, double vision and photophobia.  Respiratory: Negative.  Negative for cough and shortness of breath.   Cardiovascular: Negative.  Negative for chest pain, palpitations and leg swelling.  Gastrointestinal: Negative.  Negative for heartburn, nausea and vomiting.  Musculoskeletal:  Positive for back pain, joint pain and myalgias.  Neurological: Negative.  Negative for dizziness, focal weakness, seizures and  headaches.  Psychiatric/Behavioral: Negative.  Negative for suicidal ideas.    Past Medical History:  Diagnosis Date   Anemia Dx January 03 2015   Diabetes mellitus without complication ([place])    Elevated liver enzymes    Essential hypertension Dx Apr 2016   Fatty liver    H/O:  upper GI bleed 03/2019    variceal bleed    History of blood transfusion 03/2019   Liver cirrhosis secondary to NASH ([place])    Pneumonia 03/2019   Reflux    Thrombocytopenia ([place])     Past Surgical History:  Procedure Laterality Date   ESOPHAGEAL BANDING  03/22/2019   Procedure: ESOPHAGEAL BANDING;  Surgeon: Wilford Corner, MD;  [facility];  Service: Endoscopy;;   ESOPHAGEAL BANDING  03/23/2019   Procedure: ESOPHAGEAL BANDING;  Surgeon: Wilford Corner, MD;  [facility];  Service: Endoscopy;;   ESOPHAGEAL BANDING  09/20/2019   Procedure: ESOPHAGEAL BANDING;  Surgeon: Wilford Corner, MD;  [facility];  Service: Endoscopy;;   ESOPHAGEAL BANDING N/A 12/21/2019   Procedure: ESOPHAGEAL BANDING;  Surgeon: Wilford Corner, MD;  [facility];  Service: Endoscopy;  Laterality: N/A;   ESOPHAGOGASTRODUODENOSCOPY N/A 03/22/2019   Procedure: ESOPHAGOGASTRODUODENOSCOPY (EGD);  Surgeon: Wilford Corner, MD;  [facility];  Service: Endoscopy;  Laterality: N/A;   ESOPHAGOGASTRODUODENOSCOPY N/A 03/23/2019   Procedure: ESOPHAGOGASTRODUODENOSCOPY (EGD);  Surgeon: Wilford Corner, MD;  [facility];  Service: Endoscopy;  Laterality: N/A;   ESOPHAGOGASTRODUODENOSCOPY (EGD) WITH PROPOFOL N/A 07/23/2019   Procedure: ESOPHAGOGASTRODUODENOSCOPY (EGD) WITH PROPOFOL with possible banding;  Surgeon: Wilford Corner, MD;  [facility];  Service: Endoscopy;  Laterality: N/A;   ESOPHAGOGASTRODUODENOSCOPY (EGD) WITH PROPOFOL N/A 09/20/2019   Procedure: ESOPHAGOGASTRODUODENOSCOPY (EGD) WITH PROPOFOL;  Surgeon: Wilford Corner, MD;  [facility];  Service: Endoscopy;  Laterality: N/A;   ESOPHAGOGASTRODUODENOSCOPY (EGD) WITH PROPOFOL N/A 12/21/2019   Procedure: ESOPHAGOGASTRODUODENOSCOPY (EGD) WITH PROPOFOL with possible banding;  Surgeon: Wilford Corner, MD;  [facility];  Service: Endoscopy;  Laterality: N/A;    TUBAL LIGATION  Jul 25, 2000   VENTRAL HERNIA REPAIR N/A 07/06/2020   Procedure: VENTRAL HERNIA REPAIR WITH MESH;  Surgeon: Coralie Keens, MD;  Location: [facility];  Service: General;  Laterality: N/A;    Family History  Problem Relation Age of Onset   COPD Mother    Esophageal cancer Mother    Diabetes Mother    Cancer Mother    Hypertension Mother    Diabetes Father    Breast cancer Maternal Aunt     Social History Reviewed with no changes to be made today.   Outpatient Medications Prior to Visit  Medication Sig Dispense Refill   acetaminophen (TYLENOL) 500 MG tablet Take 500-1,000 mg by mouth every 6 (six) hours as needed for mild pain.     albuterol (VENTOLIN HFA) 108 (90 Base) MCG/ACT inhaler INHALE 2 PUFFS INTO THE LUNGS EVERY 6 HOURS AS NEEDED FOR COUGH 18 each 0   amitriptyline (ELAVIL) 25 MG tablet TAKE 1 TABLET(25 MG) BY MOUTH AT BEDTIME 90 tablet 0   benzonatate (TESSALON) 200 MG capsule TAKE 1 CAPSULE BY MOUTH 2 TIMES DAILY AS NEEDED FOR COUGH 20 capsule 0   BISACODYL 5 MG EC tablet Take 5 mg by mouth daily.     blood glucose meter kit and supplies KIT Dispense based on patient and insurance preference. Use up to four times daily as directed. (FOR ICD-9 250.00, 250.01). Dispense [place]ion 1 each 0   dapagliflozin propanediol (FARXIGA)  5 MG TABS tablet Take 1 tablet (5 mg total) by mouth daily before breakfast. 90 tablet 1   fluticasone (FLONASE) 50 MCG/ACT nasal spray SHAKE LIQUID AND USE 1 SPRAY IN EACH NOSTRIL DAILY AS NEEDED FOR ALLERGIES OR EAR PRESSURE 16 g 1   glucose blood (CONTOUR NEXT TEST) test strip USE UP TO FOUR TIMES DAILY AS DIRECTED 100 strip 6   losartan-hydrochlorothiazide (HYZAAR) 50-12.5 MG tablet Take 1 tablet by mouth daily. 90 tablet 1   metFORMIN (GLUCOPHAGE) 500 MG tablet Take 1 tablet (500 mg total) by mouth 2 (two) times daily with a meal. 180 tablet 1   Microlet Lancets MISC USE UP TO FOUR TIMES DAILY AS DIRECTED 100 each 11   mupirocin ointment  (BACTROBAN) 2 % APPLY TOPICALLY TWICE A DAY 22 g 0   ondansetron (ZOFRAN-ODT) 4 MG disintegrating tablet Take 4 mg by mouth 3 (three) times daily.     pantoprazole (PROTONIX) 40 MG tablet Take 1 tablet (40 mg total) by mouth daily. 90 tablet 1   polyethylene glycol-electrolytes (NULYTELY) 420 g solution Take by mouth.     propranolol (INDERAL) 20 MG tablet Take 1 tablet (20 mg total) by mouth 2 (two) times daily. 180 tablet 1   BINAXNOW COVID-19 AG HOME TEST KIT      ferrous sulfate 325 (65 FE) MG tablet Take 1 tablet (325 mg total) by mouth 2 (two) times daily with a meal. 180 tablet 3   Norgestimate-Ethinyl Estradiol Triphasic (TRI-ESTARYLLA) 0.18/0.215/0.25 MG-35 MCG tablet Take 1 tablet by mouth daily. (Patient not taking: No sig reported) 84 tablet 3   No facility-administered medications prior to visit.    Allergies  Allergen Reactions   Nsaids     GI BLEED       Objective:    BP 117/74   Pulse 75   Ht 5' 3.5" (1.613 m)   Wt 240 lb 8 oz (109.1 kg)   LMP 08/12/2021 (Exact Date)   SpO2 98%   BMI 41.93 kg/m  Wt Readings from Last 3 Encounters:  09/05/21 240 lb 8 oz (109.1 kg)  06/27/21 240 lb 9.6 oz (109.1 kg)  02/12/21 247 lb (112 kg)    Physical Exam Vitals and nursing note reviewed.  Constitutional:      Appearance: She is well-developed.  HENT:     Head: Normocephalic and atraumatic.  Cardiovascular:     Rate and Rhythm: Normal rate and regular rhythm.     Heart sounds: Normal heart sounds. No murmur heard.   No friction rub. No gallop.  Pulmonary:     Effort: Pulmonary effort is normal. No tachypnea or respiratory distress.     Breath sounds: Normal breath sounds. No decreased breath sounds, wheezing, rhonchi or rales.  Chest:     Chest wall: No tenderness.  Abdominal:     General: Bowel sounds are normal.     Palpations: Abdomen is soft.  Musculoskeletal:        General: Normal range of motion.     Cervical back: Normal range of motion.  Skin:     General: Skin is warm and dry.  Neurological:     Mental Status: She is alert and oriented to person, place, and time.     Coordination: Coordination normal.  Psychiatric:        Behavior: Behavior normal. Behavior is cooperative.        Thought Content: Thought content normal.        Judgment: Judgment normal.  Patient has been counseled extensively about nutrition and exercise as well as the importance of adherence with medications and regular follow-up. The patient was given clear instructions to go to ER or return to medical center if symptoms don't improve, worsen or new problems develop. The patient verbalized understanding.   Follow-up: Return in about 3 months (around 12/06/2021).   Gildardo Pounds, FNP-BC [facility] [place], [place]   09/05/2021, 3:59 PM

## 2021-09-10 ENCOUNTER — Other Ambulatory Visit: Payer: Self-pay | Admitting: Nurse Practitioner

## 2021-09-10 DIAGNOSIS — E118 Type 2 diabetes mellitus with unspecified complications: Secondary | ICD-10-CM

## 2021-09-10 NOTE — Telephone Encounter (Signed)
Requested Prescriptions  Pending Prescriptions Disp Refills  . metFORMIN (GLUCOPHAGE) 500 MG tablet [Pharmacy Med Name: METFORMIN HCL 500 MG TABLET] 180 tablet 1    Sig: TAKE 1 TABLET BY MOUTH 2 TIMES DAILY WITH A MEAL.     Endocrinology:  Diabetes - Biguanides Passed - 09/10/2021 10:26 AM      Passed - Cr in normal range and within 360 days    Creat  Date Value Ref Range Status  06/21/2016 0.68 0.50 - 1.10 mg/dL Final   Creatinine, Ser  Date Value Ref Range Status  11/07/2020 0.65 0.57 - 1.00 mg/dL Final         Passed - HBA1C is between 0 and 7.9 and within 180 days    Hemoglobin A1C  Date Value Ref Range Status  09/05/2021 6.2 (A) 4.0 - 5.6 % Final   Hgb A1c MFr Bld  Date Value Ref Range Status  05/11/2021 7.7 (H) 4.8 - 5.6 % Final    Comment:             Prediabetes: 5.7 - 6.4          Diabetes: >6.4          Glycemic control for adults with diabetes: <7.0          Passed - eGFR in normal range and within 360 days    GFR, Est African American  Date Value Ref Range Status  06/21/2016 >89 >=60 mL/min Final   GFR calc Af Amer  Date Value Ref Range Status  11/07/2020 125 >59 mL/min/1.73 Final    Comment:    **In accordance with recommendations from the NKF-ASN Task force,**   [facility] is in the process of updating its eGFR calculation to the   2021 CKD-EPI creatinine equation that estimates kidney function   without a race variable.    GFR, Est Non African American  Date Value Ref Range Status  06/21/2016 >89 >=60 mL/min Final   GFR calc non Af Amer  Date Value Ref Range Status  11/07/2020 108 >59 mL/min/1.73 Final         Passed - Valid encounter within last 6 months    Recent Outpatient Visits          5 days ago Type 2 diabetes mellitus without complication, without long-term current use of insulin ([place])   [place], [place] W, NP   1 month ago Type 2 diabetes mellitus without complication, without long-term current  use of insulin ([place])   [place], [place] W, NP   7 months ago Type 2 diabetes mellitus without complication, without long-term current use of insulin [facility])   [place], [place] W, NP   10 months ago Diabetes mellitus type 2, uncontrolled, with complications [facility])   [place], Vernia Buff, NP   10 months ago Essential hypertension   [place], RPH-CPP

## 2021-09-13 ENCOUNTER — Other Ambulatory Visit: Payer: Self-pay

## 2021-09-17 ENCOUNTER — Encounter: Payer: Self-pay | Admitting: Nurse Practitioner

## 2021-09-18 ENCOUNTER — Other Ambulatory Visit: Payer: Self-pay

## 2021-09-19 ENCOUNTER — Other Ambulatory Visit: Payer: Self-pay

## 2021-10-01 ENCOUNTER — Other Ambulatory Visit: Payer: Self-pay | Admitting: Nurse Practitioner

## 2021-10-01 ENCOUNTER — Encounter: Payer: Self-pay | Admitting: Nurse Practitioner

## 2021-10-01 DIAGNOSIS — N939 Abnormal uterine and vaginal bleeding, unspecified: Secondary | ICD-10-CM

## 2021-10-03 ENCOUNTER — Other Ambulatory Visit: Payer: Self-pay | Admitting: Nurse Practitioner

## 2021-10-04 NOTE — Telephone Encounter (Signed)
Requested Prescriptions  Pending Prescriptions Disp Refills  . albuterol (VENTOLIN HFA) 108 (90 Base) MCG/ACT inhaler [Pharmacy Med Name: ALBUTEROL HFA (PROAIR) INHALER] 8.5 each 1    Sig: INHALE 2 PUFFS INTO THE LUNGS EVERY 6 HOURS AS NEEDED FOR COUGH     Pulmonology:  Beta Agonists Failed - 10/03/2021  9:45 AM      Failed - One inhaler should last at least one month. If the patient is requesting refills earlier, contact the patient to check for uncontrolled symptoms.      Passed - Valid encounter within last 12 months    Recent Outpatient Visits          4 weeks ago Type 2 diabetes mellitus without complication, without long-term current use of insulin [facility])   [place], [place] W, NP   2 months ago Type 2 diabetes mellitus without complication, without long-term current use of insulin [facility])   [place], [place] W, NP   8 months ago Type 2 diabetes mellitus without complication, without long-term current use of insulin [facility])   [place], [place] W, NP   11 months ago Diabetes mellitus type 2, uncontrolled, with complications [facility])   [place], Vernia Buff, NP   11 months ago Essential hypertension   [place], RPH-CPP

## 2021-10-17 ENCOUNTER — Other Ambulatory Visit: Payer: Self-pay

## 2021-10-18 ENCOUNTER — Other Ambulatory Visit: Payer: Self-pay

## 2021-10-30 ENCOUNTER — Encounter: Payer: Self-pay | Admitting: Nurse Practitioner

## 2021-11-01 ENCOUNTER — Encounter: Payer: Self-pay | Admitting: Family Medicine

## 2021-11-01 ENCOUNTER — Other Ambulatory Visit: Payer: Self-pay

## 2021-11-01 ENCOUNTER — Ambulatory Visit (INDEPENDENT_AMBULATORY_CARE_PROVIDER_SITE_OTHER): Payer: 59 | Admitting: Family Medicine

## 2021-11-01 DIAGNOSIS — N920 Excessive and frequent menstruation with regular cycle: Secondary | ICD-10-CM

## 2021-11-01 MED ORDER — NORETHINDRONE ACETATE 5 MG PO TABS: 5.0000 mg | ORAL_TABLET | Freq: Every day | ORAL | 1 refills | Status: DC

## 2021-11-01 NOTE — Assessment & Plan Note (Signed)
Given chronicity and need for endometrial sampling, will book for HTA. Will need progesterone for this prior to surgery. Risks include but are not limited to bleeding, infection, injury to surrounding structures, including bowel, bladder and ureters, blood clots, and death.

## 2021-11-01 NOTE — Progress Notes (Signed)
° °  Subjective:    Patient ID: Angela Castillo is a 45 y.o. female presenting with Referral (Heavy periods)  on 11/01/2021  HPI: Patient is a [id number] with 2 prior SVDs and BTL. Has had a ventral hernia repair. Had initially had regular cycles. Now have gotten a little bit off. Sometimes it occurs at the beginning and then at the end. They have been heavier lately and having to wear 2 pads and changing them more frequently. Cycles have been 7-8 days and are usually 5 days and last one was only 3 days. Having hot flashes which have increased in last 1.5 years. Mom went through menopause in her 28s.  Review of Systems  Constitutional:  Negative for chills and fever.  Respiratory:  Negative for shortness of breath.   Cardiovascular:  Negative for chest pain.  Gastrointestinal:  Negative for abdominal pain, nausea and vomiting.  Genitourinary:  Positive for vaginal bleeding. Negative for dysuria.  Skin:  Negative for rash.     Objective:    BP 126/76    Pulse 72    Ht 5' 3"  (1.6 m)    Wt 244 lb (110.7 kg)    LMP 10/24/2021    BMI 43.22 kg/m  Physical Exam Vitals reviewed. Exam conducted with a chaperone present.  Constitutional:      General: She is not in acute distress.    Appearance: She is well-developed.  HENT:     Head: Normocephalic and atraumatic.  Eyes:     General: No scleral icterus. Cardiovascular:     Rate and Rhythm: Normal rate.  Pulmonary:     Effort: Pulmonary effort is normal.  Abdominal:     Palpations: Abdomen is soft.  Musculoskeletal:     Cervical back: Neck supple.  Skin:    General: Skin is warm and dry.  Neurological:     Mental Status: She is alert and oriented to person, place, and time.    Pelvic sonogram: Endometrium 9 mm, Uterus is small, ovaries are normal.     Assessment & Plan:   Problem List Items Addressed This Visit       Unprioritized   Menorrhagia    Given chronicity and need for endometrial sampling, will book for  HTA. Will need progesterone for this prior to surgery. Risks include but are not limited to bleeding, infection, injury to surrounding structures, including bowel, bladder and ureters, blood clots, and death.         Relevant Medications   norethindrone (AYGESTIN) 5 MG tablet     Return in about 3 months (around 01/30/2022) for postop check.  [name] 11/01/2021 3:57 PM

## 2021-11-05 ENCOUNTER — Other Ambulatory Visit: Payer: Self-pay

## 2021-11-11 ENCOUNTER — Other Ambulatory Visit: Payer: Self-pay | Admitting: Family Medicine

## 2021-11-11 DIAGNOSIS — R519 Headache, unspecified: Secondary | ICD-10-CM

## 2021-11-11 NOTE — Telephone Encounter (Signed)
Requested Prescriptions  Pending Prescriptions Disp Refills   amitriptyline (ELAVIL) 25 MG tablet [Pharmacy Med Name: AMITRIPTYLINE 25MG  TABLETS] 90 tablet 0    Sig: TAKE 1 TABLET(25 MG) BY MOUTH AT BEDTIME     Psychiatry:  Antidepressants - Heterocyclics (TCAs) Passed - 11/11/2021  3:19 AM      Passed - Valid encounter within last 6 months    Recent Outpatient Visits          2 months ago Type 2 diabetes mellitus without complication, without long-term current use of insulin (HCC)   [facility] [facility] [place], [place] W, NP   3 months ago Type 2 diabetes mellitus without complication, without long-term current use of insulin [facility])   [place] [facility] [place], [place] W, NP   9 months ago Type 2 diabetes mellitus without complication, without long-term current use of insulin (HCC)   [employer] [facility] [place], [place], NP   1 year ago Diabetes mellitus type 2, uncontrolled, with complications [facility])   [facility] [place], [place], NP   1 year ago Essential hypertension   [facility], Lois [place], RPH-CPP      Future Appointments            In 3 weeks Cornelius Moras, NP [facility]

## 2021-11-11 NOTE — H&P (Signed)
Angela Castillo is an 46 y.o. [phone] female.   Chief Complaint: abnormal bleeding HPI: Patient has h/o heavier cycles lasting longer and longer. Also having hot flashes. She is concerned about her bleeding.  Past Medical History:  Diagnosis Date   Anemia Dx January 03 2015   Diabetes mellitus without complication ([place])    Elevated liver enzymes    Essential hypertension Dx Apr 2016   Fatty liver    H/O: upper GI bleed 03/2019    variceal bleed    History of blood transfusion 03/2019   Liver cirrhosis secondary to NASH ([place])    Pneumonia 03/2019   Reflux    Thrombocytopenia ([place])     Past Surgical History:  Procedure Laterality Date   ESOPHAGEAL BANDING  03/22/2019   Procedure: ESOPHAGEAL BANDING;  Surgeon: Wilford Corner, MD;  Location: [facility];  Service: Endoscopy;;   ESOPHAGEAL BANDING  03/23/2019   Procedure: ESOPHAGEAL BANDING;  Surgeon: Wilford Corner, MD;  Location: [facility];  Service: Endoscopy;;   ESOPHAGEAL BANDING  09/20/2019   Procedure: ESOPHAGEAL BANDING;  Surgeon: Wilford Corner, MD;  Location: [facility];  Service: Endoscopy;;   ESOPHAGEAL BANDING N/A 12/21/2019   Procedure: ESOPHAGEAL BANDING;  Surgeon: Wilford Corner, MD;  Location: [facility];  Service: Endoscopy;  Laterality: N/A;   ESOPHAGOGASTRODUODENOSCOPY N/A 03/22/2019   Procedure: ESOPHAGOGASTRODUODENOSCOPY (EGD);  Surgeon: Wilford Corner, MD;  Location: [facility];  Service: Endoscopy;  Laterality: N/A;   ESOPHAGOGASTRODUODENOSCOPY N/A 03/23/2019   Procedure: ESOPHAGOGASTRODUODENOSCOPY (EGD);  Surgeon: Wilford Corner, MD;  Location: [facility];  Service: Endoscopy;  Laterality: N/A;   ESOPHAGOGASTRODUODENOSCOPY (EGD) WITH PROPOFOL N/A 07/23/2019   Procedure: ESOPHAGOGASTRODUODENOSCOPY (EGD) WITH PROPOFOL with possible banding;  Surgeon: Wilford Corner, MD;  Location: [facility];  Service: Endoscopy;  Laterality: N/A;   ESOPHAGOGASTRODUODENOSCOPY (EGD) WITH PROPOFOL  N/A 09/20/2019   Procedure: ESOPHAGOGASTRODUODENOSCOPY (EGD) WITH PROPOFOL;  Surgeon: Wilford Corner, MD;  [facility]: [facility];  Service: Endoscopy;  Laterality: N/A;   ESOPHAGOGASTRODUODENOSCOPY (EGD) WITH PROPOFOL N/A 12/21/2019   Procedure: ESOPHAGOGASTRODUODENOSCOPY (EGD) WITH PROPOFOL with possible banding;  Surgeon: Wilford Corner, MD;  Location: [facility];  Service: Endoscopy;  Laterality: N/A;   TUBAL LIGATION  Jul 25, 2000   VENTRAL HERNIA REPAIR N/A 07/06/2020   Procedure: VENTRAL HERNIA REPAIR WITH MESH;  Surgeon: Coralie Keens, MD;  Location: [facility];  Service: General;  Laterality: N/A;    Family History  Problem Relation Age of Onset   Diabetes Father    COPD Mother    Esophageal cancer Mother    Diabetes Mother    Cancer Mother        esophageal   Hypertension Mother    Breast cancer Maternal Aunt    Social History:  reports that she has never smoked. She has never used smokeless tobacco. She reports current alcohol use. She reports that she does not use drugs.  Allergies:  Allergies  Allergen Reactions   Nsaids     GI BLEED    No medications prior to admission.    A comprehensive review of systems was negative.  Last menstrual period 10/24/2021. General appearance: alert, cooperative, and appears stated age Head: Normocephalic, without obvious abnormality, atraumatic Neck: supple, symmetrical, trachea midline Lungs:  normal effort Heart: regular rate and rhythm Abdomen: soft, non-tender; bowel sounds normal; no masses,  no organomegaly Extremities: extremities normal, atraumatic, no cyanosis or edema Skin: Skin color, texture, turgor normal. No rashes or lesions Neurologic: Grossly normal   Lab Results  Component Value Date  WBC 5.9 06/27/2021   HGB 12.7 06/27/2021   HCT 39.7 06/27/2021   MCV 86.1 06/27/2021   PLT 100 (L) 06/27/2021   Lab Results  Component Value Date   PREGTESTUR NEGATIVE 07/06/2020   HCG <5.0 03/21/2019      Assessment/Plan Principal Problem:   Menorrhagia  For Hysteroscopy, D & C and HTA Risks include but are not limited to bleeding, infection, injury to surrounding structures, including bowel, bladder and ureters, blood clots, and death.  Likelihood of success is high.    Angela Castillo 11/11/2021, 1:34 PM

## 2021-11-14 ENCOUNTER — Encounter: Payer: Self-pay | Admitting: Family Medicine

## 2021-11-15 ENCOUNTER — Emergency Department (HOSPITAL_COMMUNITY): Payer: 59

## 2021-11-15 ENCOUNTER — Encounter (HOSPITAL_COMMUNITY): Payer: Self-pay

## 2021-11-15 ENCOUNTER — Other Ambulatory Visit: Payer: Self-pay | Admitting: Nurse Practitioner

## 2021-11-15 ENCOUNTER — Other Ambulatory Visit: Payer: Self-pay

## 2021-11-15 ENCOUNTER — Emergency Department (HOSPITAL_COMMUNITY)
Admission: EM | Admit: 2021-11-15 | Discharge: 2021-11-16 | Disposition: A | Discharge status: Home / Self Care | Payer: 59 | Attending: Emergency Medicine | Admitting: Emergency Medicine

## 2021-11-15 DIAGNOSIS — S6991XA Unspecified injury of right wrist, hand and finger(s), initial encounter: Secondary | ICD-10-CM

## 2021-11-15 DIAGNOSIS — Z23 Encounter for immunization: Secondary | ICD-10-CM | POA: Insufficient documentation

## 2021-11-15 DIAGNOSIS — S60011A Contusion of right thumb without damage to nail, initial encounter: Secondary | ICD-10-CM | POA: Diagnosis not present

## 2021-11-15 DIAGNOSIS — S6010XA Contusion of unspecified finger with damage to nail, initial encounter: Secondary | ICD-10-CM

## 2021-11-15 DIAGNOSIS — W548XXA Other contact with dog, initial encounter: Secondary | ICD-10-CM | POA: Diagnosis not present

## 2021-11-15 DIAGNOSIS — K219 Gastro-esophageal reflux disease without esophagitis: Secondary | ICD-10-CM

## 2021-11-15 IMAGING — CR DG HAND COMPLETE 3+V*R*
3 series · 3 of 3 positions shown · non-contrast
Comparison: None.

CLINICAL DATA: right thumb lac and pain

EXAM:
RIGHT HAND - COMPLETE 3+ VIEW

[hand pa]
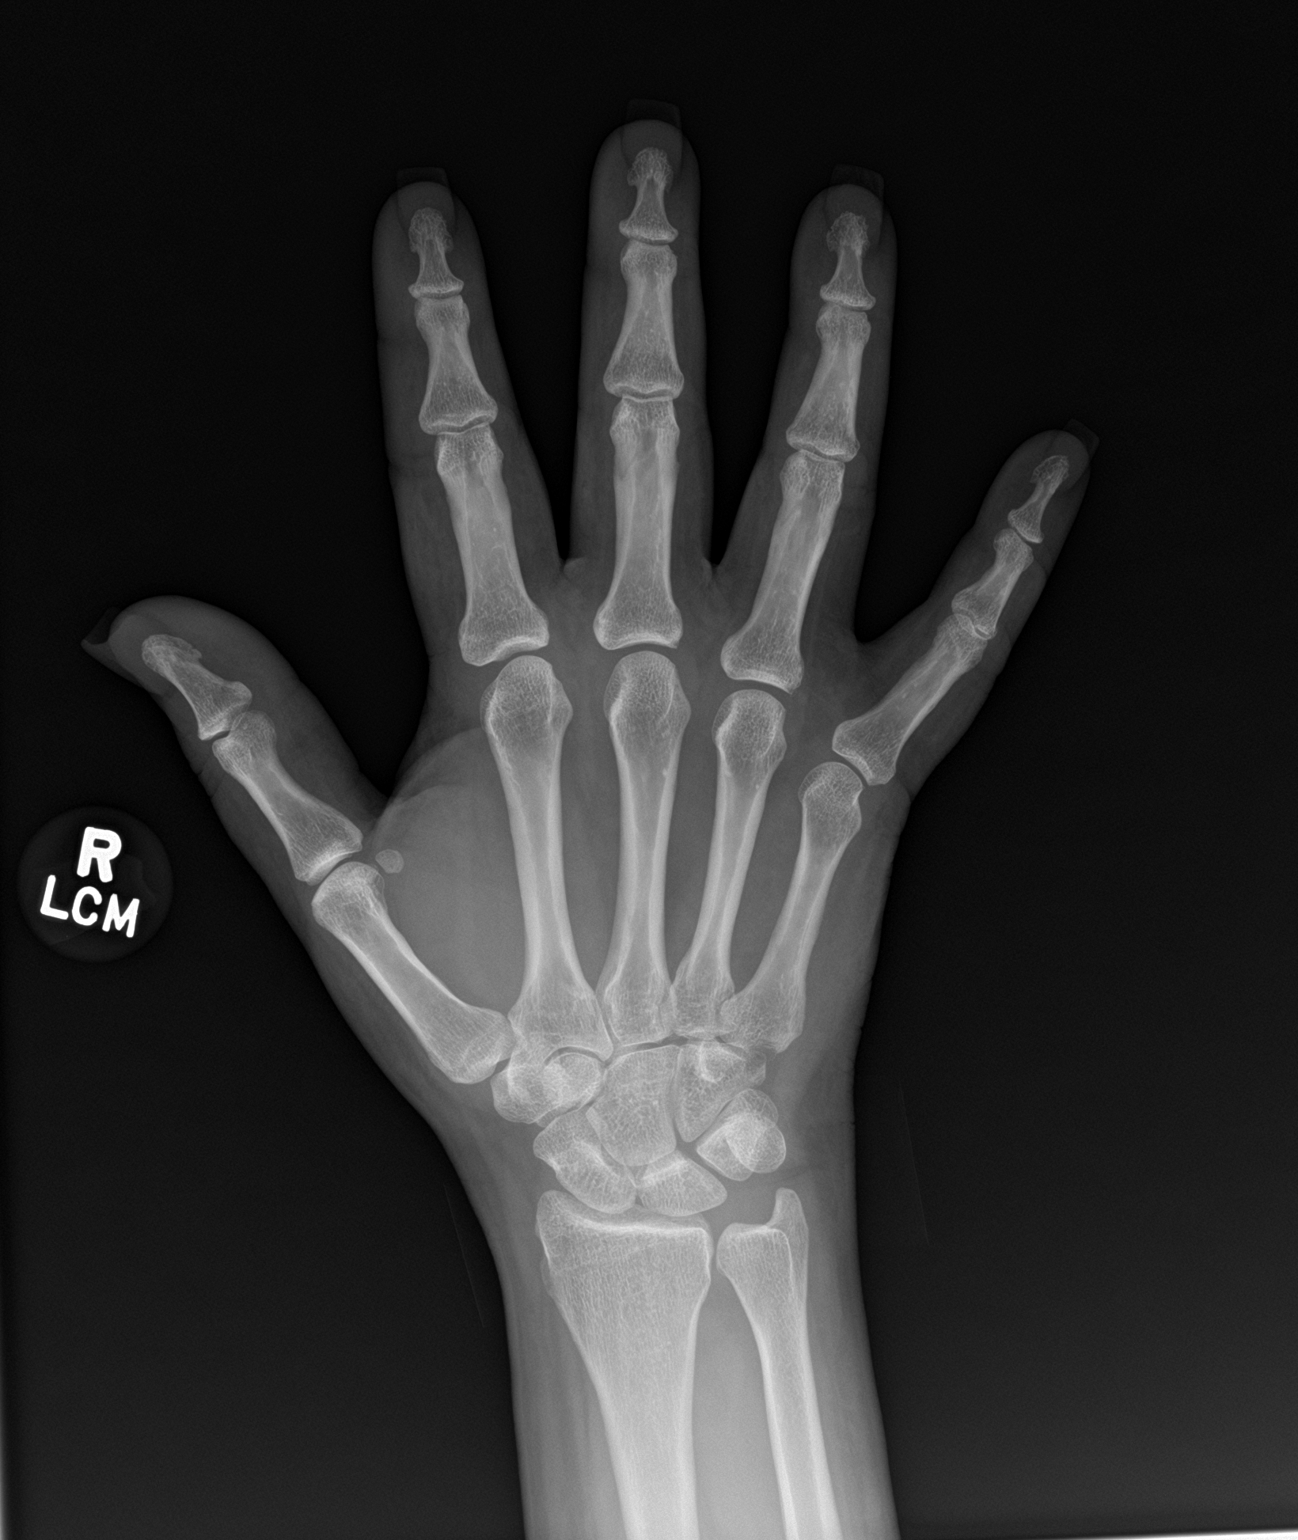

[hand obl]
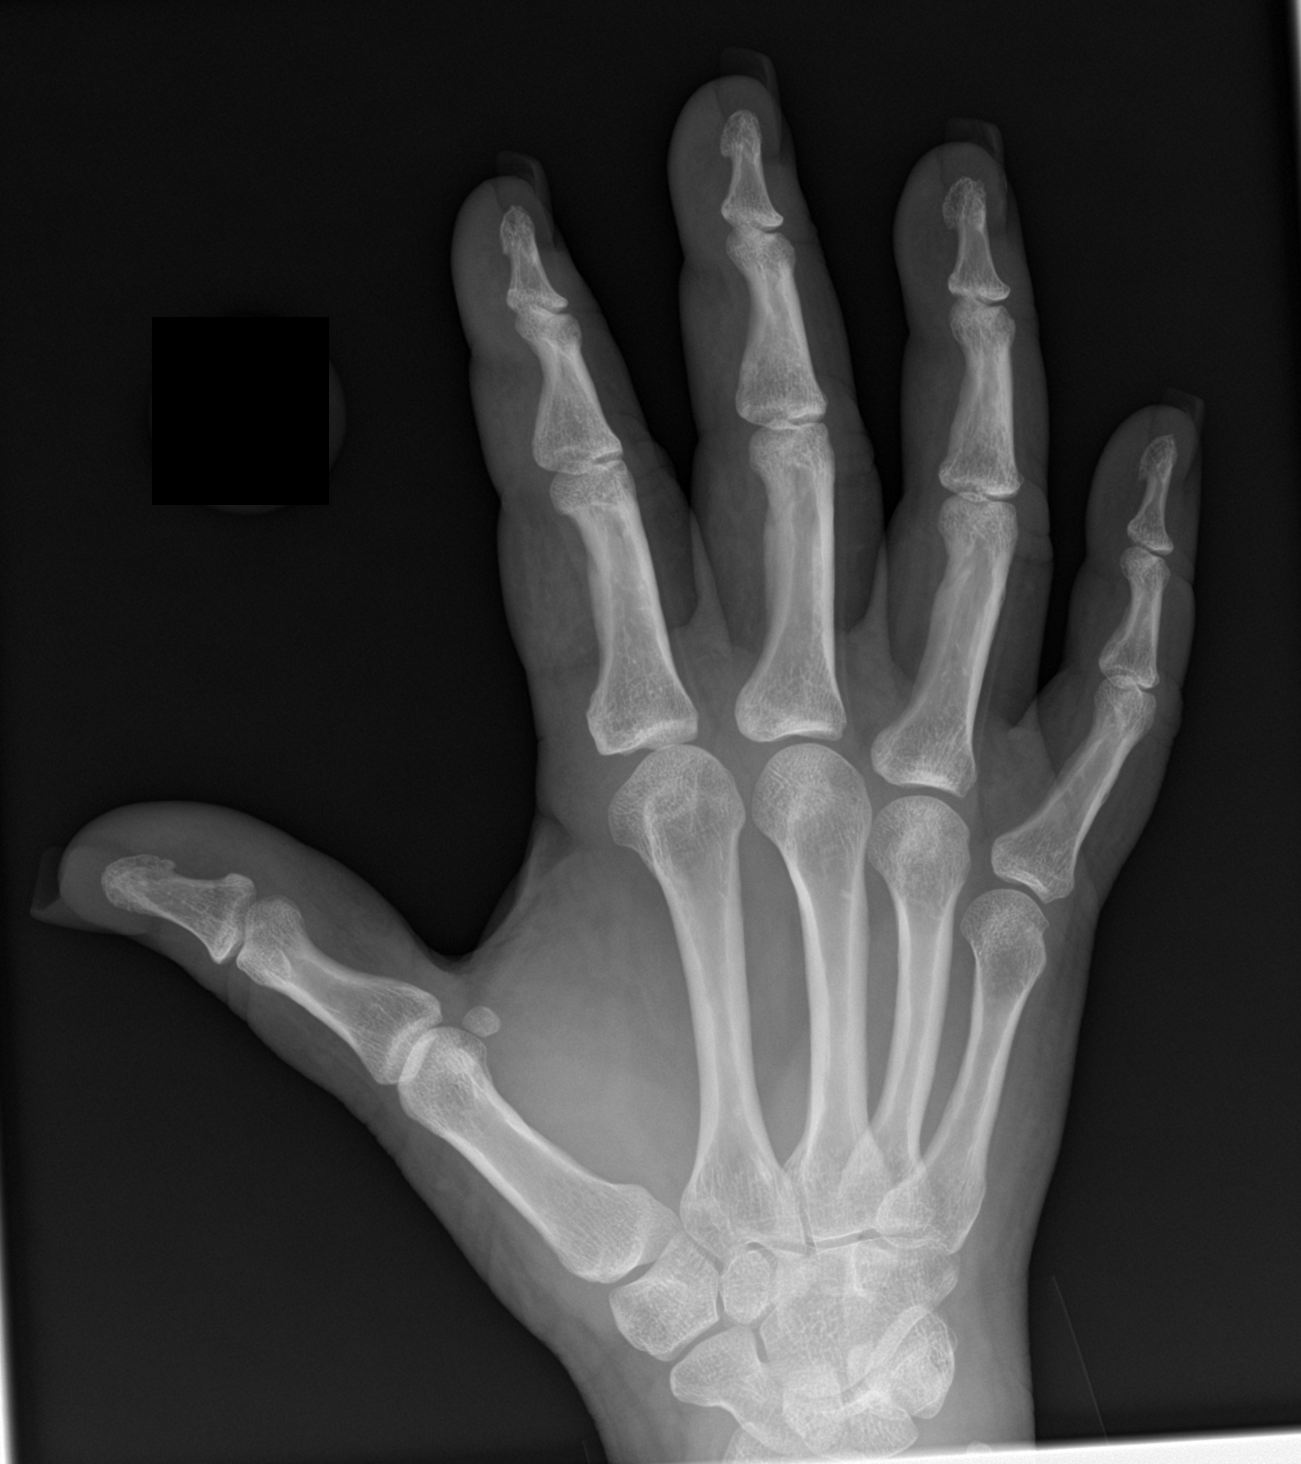

[hand lat]
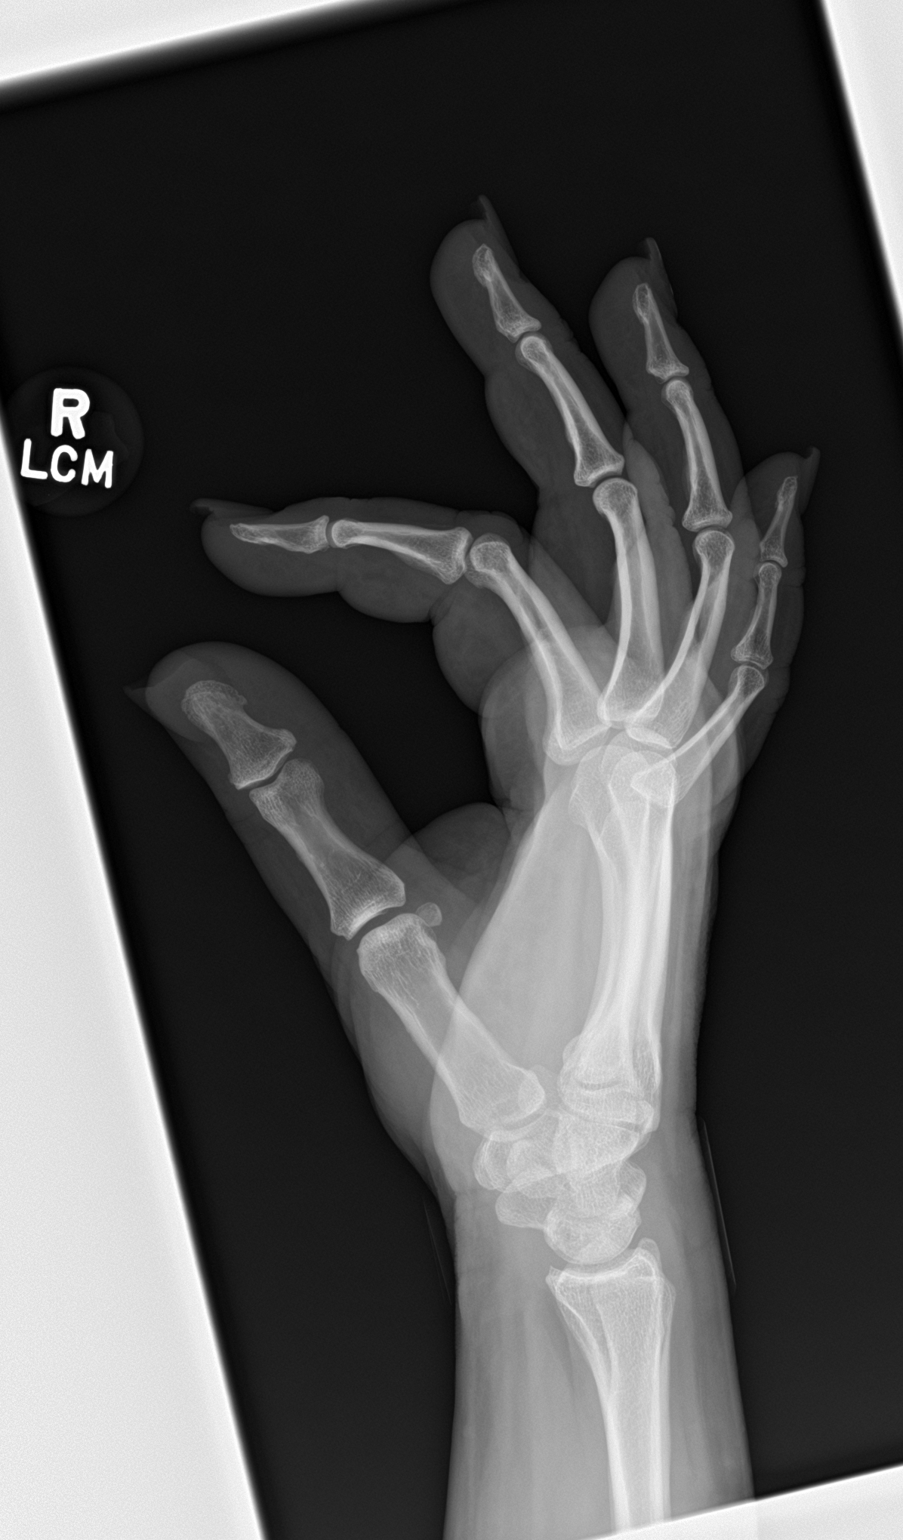

[3 of 3 positions shown; findings below may reference images not displayed]

FINDINGS: There is no evidence of acute fracture or dislocation. No bony
erosions. No significant arthropathy. There is no evidence of
radiopaque foreign body.
IMPRESSION: No acute osseous abnormality or evidence of radiopaque foreign body.

## 2021-11-15 NOTE — ED Provider Triage Note (Signed)
Emergency Medicine Provider Triage Evaluation Note  Angela Castillo , a 46 y.o. female  was evaluated in triage.  Pt complains of right thumb pain.  Earlier this afternoon she was bit by her dog on her right thumb.  She is presenting to get pain at the right fingertip underneath the nail.  There is no visible laceration or puncture wound.  She is up-to-date on her tetanus and the dog is vaccinated.  Review of Systems  Positive:  Negative:   Physical Exam  LMP 10/24/2021  Gen:   Awake, no distress   Resp:  Normal effort  MSK:   Moves extremities without difficulty  Other:  Right thumb with skin intact.  There is some dried blood underneath nailbed.   Medical Decision Making  Medically screening exam initiated at 5:52 PM.  Appropriate orders placed.  Devlynn Knoff was informed that the remainder of the evaluation will be completed by another provider, this initial triage assessment does not replace that evaluation, and the importance of remaining in the ED until their evaluation is complete.  X-ray to rule out fracture.  Likely will not need laceration repair.   Claudie Leach, PA-C 11/15/21 1753

## 2021-11-15 NOTE — ED Triage Notes (Signed)
Pt arrived POV from home c/o a right thumb injury. Pt states she was bending down and the dog was running playing and the dog collided with her finger and she heard a pop. Pt's thumb nail is partially detached.

## 2021-11-16 ENCOUNTER — Other Ambulatory Visit: Payer: Self-pay

## 2021-11-16 ENCOUNTER — Other Ambulatory Visit (HOSPITAL_COMMUNITY): Payer: Self-pay

## 2021-11-16 MED ORDER — CEPHALEXIN 500 MG PO CAPS: 500.0000 mg | ORAL_CAPSULE | Freq: Two times a day (BID) | ORAL | 0 refills | Status: DC

## 2021-11-16 MED ORDER — TETANUS-DIPHTH-ACELL PERTUSSIS 5-2.5-18.5 LF-MCG/0.5 IM SUSY
0.5000 mL | PREFILLED_SYRINGE | Freq: Once | INTRAMUSCULAR | Status: AC
  Administered 2021-11-16: 0.5 mL via INTRAMUSCULAR
  Filled 2021-11-16: qty 0.5

## 2021-11-16 MED ORDER — ACETAMINOPHEN 325 MG PO TABS
650.0000 mg | ORAL_TABLET | Freq: Once | ORAL | Status: AC
  Administered 2021-11-16: 650 mg via ORAL
  Filled 2021-11-16: qty 2

## 2021-11-16 NOTE — ED Provider Notes (Signed)
[facility] EMERGENCY DEPARTMENT Provider Note   CSN: [id number] Arrival date & time: 11/15/21  1655     History  Chief Complaint  Patient presents with   Finger Injury    Angela Castillo is a 46 y.o. female.  The history is provided by the patient.  Hand Pain This is a new problem. The current episode started 6 to 12 hours ago. The problem occurs constantly. The problem has not changed since onset.Exacerbated by: Movement. The symptoms are relieved by rest.  Patient reports that her family members dog was running and collided into her thumb.  She reports pain and bleeding around the nailbed.  She reports she heard a pop in her finger    Home Medications Prior to Admission medications   Medication Sig Start Date End Date Taking? Authorizing Provider  cephALEXin (KEFLEX) 500 MG capsule Take 1 capsule (500 mg total) by mouth 2 (two) times daily. 11/16/21  Yes Ripley Fraise, MD  acetaminophen (TYLENOL) 500 MG tablet Take 500-1,000 mg by mouth every 6 (six) hours as needed for mild pain.    [provider]  albuterol (VENTOLIN HFA) 108 (90 Base) MCG/ACT inhaler INHALE 2 PUFFS INTO THE LUNGS EVERY 6 HOURS AS NEEDED FOR COUGH 10/04/21   Gildardo Pounds, NP  amitriptyline (ELAVIL) 25 MG tablet TAKE 1 TABLET(25 MG) BY MOUTH AT BEDTIME 11/11/21   Gildardo Pounds, NP  benzonatate (TESSALON) 200 MG capsule TAKE 1 CAPSULE BY MOUTH 2 TIMES DAILY AS NEEDED FOR COUGH 08/09/21   Gildardo Pounds, NP  Biotin 10000 MCG TABS Take 10,000 mcg by mouth daily.    [provider]  blood glucose meter kit and supplies KIT Dispense based on patient and insurance preference. Use up to four times daily as directed. (FOR ICD-9 250.00, 250.01). Dispense Relion 03/29/19   Sheikh, Georgina Quint Latif, DO  dapagliflozin propanediol (FARXIGA) 5 MG TABS tablet Take 1 tablet (5 mg total) by mouth daily before breakfast. 07/18/21 12/17/21  Gildardo Pounds, NP  ferrous sulfate 325 (65  FE) MG tablet Take 325 mg by mouth in the morning and at bedtime.    [provider]  fluticasone (FLONASE) 50 MCG/ACT nasal spray SHAKE LIQUID AND USE 1 SPRAY IN EACH NOSTRIL DAILY AS NEEDED FOR ALLERGIES OR EAR PRESSURE 07/31/20   Gildardo Pounds, NP  glucose blood (CONTOUR NEXT TEST) test strip USE UP TO FOUR TIMES DAILY AS DIRECTED 04/11/21   Gildardo Pounds, NP  ketoconazole (NIZORAL) 2 % cream Apply 1 application topically daily as needed for irritation.    [provider]  losartan-hydrochlorothiazide (HYZAAR) 50-12.5 MG tablet Take 1 tablet by mouth daily. 04/11/21   Gildardo Pounds, NP  Menthol, Topical Analgesic, (ICY HOT MEDICATED SPRAY) 16 % LIQD Apply 1 spray topically daily as needed (pain).    [provider]  metFORMIN (GLUCOPHAGE) 500 MG tablet TAKE 1 TABLET BY MOUTH 2 TIMES DAILY WITH A MEAL. 09/10/21   Gildardo Pounds, NP  Microlet Lancets MISC USE UP TO FOUR TIMES DAILY AS DIRECTED 04/11/21   Gildardo Pounds, NP  mupirocin ointment (BACTROBAN) 2 % APPLY TOPICALLY TWICE A DAY 06/13/21   Charlott Rakes, MD  norethindrone (AYGESTIN) 5 MG tablet Take 1 tablet (5 mg total) by mouth daily. 11/01/21   [name], MD  ondansetron (ZOFRAN-ODT) 4 MG disintegrating tablet Take 4 mg by mouth every 8 (eight) hours as needed for vomiting or nausea. 07/03/21  [provider]  pantoprazole (PROTONIX) 40 MG tablet Take 40 mg by mouth daily.    [provider]  propranolol (INDERAL) 20 MG tablet Take 20 mg by mouth 2 (two) times daily.    [provider]  vitamin C (ASCORBIC ACID) 500 MG tablet Take 500 mg by mouth 2 (two) times daily.    [provider]      Allergies    Nsaids    Review of Systems   Review of Systems  Musculoskeletal:  Positive for arthralgias.  Neurological:  Negative for weakness.   Physical Exam Updated Vital Signs BP (!) 144/97 (BP Location: Left Arm)    Pulse 85    Temp 98.7 F (37.1 C)    Resp 20     Ht 1.626 m ('5\' 4"' )    Wt 110.7 kg    LMP 10/24/2021    SpO2 97%    BMI 41.88 kg/m  Physical Exam CONSTITUTIONAL: Well developed/well nourished HEAD: Normocephalic/atraumatic EYES: EOMI ENMT: Mucous membranes moist LUNGS:  no apparent distress ABDOMEN: soft, obese NEURO: Pt is awake/alert/appropriate, moves all extremitiesx4.  No facial droop.   EXTREMITIES: pulses normal/equal, full ROM, diffuse tenderness to right thumb but no deformities.  Full range of motion right thumb.  Small amount of blood noted under the nail.  Nail appears intact SKIN: warm, color normal PSYCH: no abnormalities of mood noted, alert and oriented to situation    ED Results / Procedures / Treatments   Labs (all labs ordered are listed, but only abnormal results are displayed) Labs Reviewed - No data to display  EKG None  Radiology DG Hand Complete Right  Result Date: 11/15/2021 CLINICAL DATA:  right thumb lac and pain EXAM: RIGHT HAND - COMPLETE 3+ VIEW COMPARISON:  None. FINDINGS: There is no evidence of acute fracture or dislocation. No bony erosions. No significant arthropathy. There is no evidence of radiopaque foreign body. IMPRESSION: No acute osseous abnormality or evidence of radiopaque foreign body. Electronically Signed   By: Maurine Simmering M.D.   On: 11/15/2021 18:20    Procedures Procedures    Medications Ordered in ED Medications  Tdap (BOOSTRIX) injection 0.5 mL (has no administration in time range)  acetaminophen (TYLENOL) tablet 650 mg (has no administration in time range)    ED Course/ Medical Decision Making/ A&P                           Medical Decision Making  Patient presents with injury to her right thumb.  I have personally reviewed the x-rays and there is no acute fracture.  Advise rest, pain control, will start antibiotics and  will prevent any infection.  Patient likely has subungual hematoma Will refer to hand surgery for her acute injury.  Patient agreeable with  plan        Final Clinical Impression(s) / ED Diagnoses Final diagnoses:  Subungual hematoma of digit of hand, initial encounter  Injury of finger of right hand, initial encounter    Rx / DC Orders ED Discharge Orders          Ordered    cephALEXin (KEFLEX) 500 MG capsule  2 times daily        11/16/21 0433              Ripley Fraise, MD 11/16/21 [phone]

## 2021-11-16 NOTE — Telephone Encounter (Signed)
Requested medication (s) are on the active medication list: by historical provider  Last refill 09/04/21  Future visit scheduled: 12/05/21  Notes to clinic:  Historical Provider, please assess.  Requested Prescriptions  Pending Prescriptions Disp Refills   propranolol (INDERAL) 20 MG tablet [Pharmacy Med Name: PROPRANOLOL 20 MG TABLET] 180 tablet 1    Sig: TAKE 1 TABLET BY MOUTH TWICE A DAY     Cardiovascular:  Beta Blockers Failed - 11/15/2021 11:45 PM      Failed - Last BP in normal range    BP Readings from Last 1 Encounters:  11/15/21 (!) 153/103          Passed - Last Heart Rate in normal range    Pulse Readings from Last 1 Encounters:  11/15/21 81          Passed - Valid encounter within last 6 months    Recent Outpatient Visits           2 months ago Type 2 diabetes mellitus without complication, without long-term current use of insulin ([place])   [place], [place] W, NP   4 months ago Type 2 diabetes mellitus without complication, without long-term current use of insulin ([place])   [place], [place] W, NP   9 months ago Type 2 diabetes mellitus without complication, without long-term current use of insulin ([place])   [place], Vernia Buff, NP   1 year ago Diabetes mellitus type 2, uncontrolled, with complications [facility])   [place], Vernia Buff, NP   1 year ago Essential hypertension   [place], Jarome Matin, RPH-CPP       Future Appointments             In 2 weeks Gildardo Pounds, NP [place]

## 2021-11-19 ENCOUNTER — Other Ambulatory Visit: Payer: Self-pay

## 2021-11-19 ENCOUNTER — Encounter (HOSPITAL_COMMUNITY): Payer: Self-pay | Admitting: Family Medicine

## 2021-11-19 NOTE — Progress Notes (Signed)
PCP - Dr. Meredeth Ide Cardiologist - denies EKG - DOS Chest x-ray - n/a ECHO - denies Cardiac Cath - denies CPAP - denies Fasting Blood Sugar:  130-150 Checks Blood Sugar:  once/day when remembers Blood Thinner Instructions: n/a Aspirin Instructions: n/a ERAS Protcol - clears unitl 8:40 COVID TEST- ambulatory  Pt reports recent ED visit for finger injury. Started on Keflex preventively--no areas of drainage, denies fevers.  1 week of ABX prescribed, started on Friday.  Anesthesia review: no  -------------  SDW INSTRUCTIONS:  Your procedure is scheduled on 11:42am. Please report to [facility] Main Entrance "A" at 9:10 A.M., and check in at the Admitting office. Call this number if you have problems the morning of surgery: [phone]   Remember: Do not eat after midnight the night before your surgery  You may drink clear liquids until 8:40am the morning of your surgery.   Clear liquids allowed are: Water, Non-Citrus Juices (without pulp), Carbonated Beverages, Clear Tea, Black Coffee Only, and Gatorade   Medications to take morning of surgery with a sip of water include: Keflex, Protonix, Propranolol If needed: Zofran, Tylenol, Flonase, Tessalon, Albuterol inhaler  Please bring all inhalers with you the day of surgery.    WHAT DO I DO ABOUT MY DIABETES MEDICATION?   Do not take oral diabetes medicines (pills) the morning of surgery. No Farxiga or Metformin the day of surgery. No Marcelline Deist the day before surgery Jan 16 (Pt reports on phone call already had today)    HOW TO MANAGE YOUR DIABETES BEFORE AND AFTER SURGERY  Why is it important to control my blood sugar before and after surgery? Improving blood sugar levels before and after surgery helps healing and can limit problems. A way of improving blood sugar control is eating a healthy diet by:  Eating less sugar and carbohydrates  Increasing activity/exercise  Talking with your doctor about reaching your blood sugar  goals High blood sugars (greater than 180 mg/dL) can raise your risk of infections and slow your recovery, so you will need to focus on controlling your diabetes during the weeks before surgery. Make sure that the doctor who takes care of your diabetes knows about your planned surgery including the date and location.  How do I manage my blood sugar before surgery? Check your blood sugar at least 4 times a day, starting 2 days before surgery, to make sure that the level is not too high or low.  Check your blood sugar the morning of your surgery when you wake up and every 2 hours until you get to the Short Stay unit.  If your blood sugar is less than 70 mg/dL, you will need to treat for low blood sugar: Do not take insulin. Treat a low blood sugar (less than 70 mg/dL) with  cup of clear juice (cranberry or apple), 4 glucose tablets, OR glucose gel. Recheck blood sugar in 15 minutes after treatment (to make sure it is greater than 70 mg/dL). If your blood sugar is not greater than 70 mg/dL on recheck, call [phone] for further instructions. Report your blood sugar to the short stay nurse when you get to Short Stay.  If you are admitted to the hospital after surgery: Your blood sugar will be checked by the staff and you will probably be given insulin after surgery (instead of oral diabetes medicines) to make sure you have good blood sugar levels. The goal for blood sugar control after surgery is 80-180 mg/dL.   As of today, STOP  taking any Aspirin (unless otherwise instructed by your surgeon), Aleve, Naproxen, Ibuprofen, Motrin, Advil, Goody's, BC's, all herbal medications, fish oil, and all vitamins.    The Morning of Surgery Do not wear jewelry, make-up or nail polish. Do not wear lotions, powders, or perfumes/colognes, or deodorant Do not bring valuables to the hospital. [facility] is not responsible for any belongings or valuables.  If you are a smoker, DO NOT Smoke 24 hours prior  to surgery  If you wear a CPAP at night please bring your mask the morning of surgery   Remember that you must have someone to transport you home after your surgery, and remain with you for 24 hours if you are discharged the same day.  Please bring cases for contacts, glasses, hearing aids, dentures or bridgework because it cannot be worn into surgery.   Patients discharged the day of surgery will not be allowed to drive home.   Please shower the NIGHT BEFORE/MORNING OF SURGERY (use antibacterial soap like DIAL soap if possible). Wear comfortable clothes the morning of surgery. Oral Hygiene is also important to reduce your risk of infection.  Remember - BRUSH YOUR TEETH THE MORNING OF SURGERY WITH YOUR REGULAR TOOTHPASTE  Patient denies shortness of breath, fever, cough and chest pain.

## 2021-11-20 ENCOUNTER — Ambulatory Visit (HOSPITAL_COMMUNITY): Payer: 59 | Admitting: Certified Registered Nurse Anesthetist

## 2021-11-20 ENCOUNTER — Encounter (HOSPITAL_COMMUNITY)
Admission: RE | Disposition: A | Discharge status: Home / Self Care | Payer: Self-pay | Source: Home / Self Care | Attending: Family Medicine

## 2021-11-20 ENCOUNTER — Encounter (HOSPITAL_COMMUNITY): Payer: Self-pay | Admitting: Family Medicine

## 2021-11-20 ENCOUNTER — Observation Stay (HOSPITAL_COMMUNITY)
Admission: RE | Admit: 2021-11-20 | Discharge: 2021-11-21 | Disposition: A | Discharge status: Home / Self Care | Payer: 59 | Attending: Family Medicine | Admitting: Family Medicine

## 2021-11-20 ENCOUNTER — Other Ambulatory Visit: Payer: Self-pay

## 2021-11-20 DIAGNOSIS — N920 Excessive and frequent menstruation with regular cycle: Secondary | ICD-10-CM | POA: Diagnosis not present

## 2021-11-20 DIAGNOSIS — N921 Excessive and frequent menstruation with irregular cycle: Secondary | ICD-10-CM

## 2021-11-20 DIAGNOSIS — I10 Essential (primary) hypertension: Secondary | ICD-10-CM | POA: Diagnosis not present

## 2021-11-20 DIAGNOSIS — E119 Type 2 diabetes mellitus without complications: Secondary | ICD-10-CM | POA: Diagnosis not present

## 2021-11-20 DIAGNOSIS — N939 Abnormal uterine and vaginal bleeding, unspecified: Secondary | ICD-10-CM | POA: Diagnosis present

## 2021-11-20 HISTORY — DX: Headache, unspecified: R51.9

## 2021-11-20 HISTORY — PX: DILITATION & CURRETTAGE/HYSTROSCOPY WITH HYDROTHERMAL ABLATION: SHX5570

## 2021-11-20 LAB — CBC
HCT: 41.9 % (ref 36.0–46.0)
Hemoglobin: 13.2 g/dL (ref 12.0–15.0)
MCH: 28 pg (ref 26.0–34.0)
MCHC: 31.5 g/dL (ref 30.0–36.0)
MCV: 88.8 fL (ref 80.0–100.0)
Platelets: 96 10*3/uL — ABNORMAL LOW (ref 150–400)
RBC: 4.72 MIL/uL (ref 3.87–5.11)
RDW: 14.4 % (ref 11.5–15.5)
WBC: 5.6 10*3/uL (ref 4.0–10.5)
nRBC: 0 % (ref 0.0–0.2)

## 2021-11-20 LAB — BASIC METABOLIC PANEL
Anion gap: 12 (ref 5–15)
BUN: 12 mg/dL (ref 6–20)
CO2: 21 mmol/L — ABNORMAL LOW (ref 22–32)
Calcium: 9 mg/dL (ref 8.9–10.3)
Chloride: 105 mmol/L (ref 98–111)
Creatinine, Ser: 0.57 mg/dL (ref 0.44–1.00)
GFR, Estimated: >60 mL/min (ref >60)
Glucose, Bld: 128 mg/dL — ABNORMAL HIGH (ref 70–99)
Potassium: 3.9 mmol/L (ref 3.5–5.1)
Sodium: 138 mmol/L (ref 135–145)

## 2021-11-20 LAB — POCT PREGNANCY, URINE: Preg Test, Ur: NEGATIVE

## 2021-11-20 LAB — GLUCOSE, CAPILLARY
Glucose-Capillary: 129 mg/dL — ABNORMAL HIGH (ref 70–99)
Glucose-Capillary: 114 mg/dL — ABNORMAL HIGH (ref 70–99)

## 2021-11-20 SURGERY — DILATATION & CURETTAGE/HYSTEROSCOPY WITH HYDROTHERMAL ABLATION
Anesthesia: General | Site: Uterus

## 2021-11-20 MED ORDER — LIDOCAINE 2% (20 MG/ML) 5 ML SYRINGE
INTRAMUSCULAR | Status: AC
  Filled 2021-11-20: qty 5

## 2021-11-20 MED ORDER — ONDANSETRON HCL 4 MG PO TABS
4.0000 mg | ORAL_TABLET | Freq: Four times a day (QID) | ORAL | Status: DC | PRN
Start: 2021-11-20 — End: 2021-11-21

## 2021-11-20 MED ORDER — MIDAZOLAM HCL 5 MG/5ML IJ SOLN
INTRAMUSCULAR | Status: DC | PRN
  Administered 2021-11-20: 2 mg via INTRAVENOUS

## 2021-11-20 MED ORDER — LACTATED RINGERS IV SOLN: INTRAVENOUS | Status: DC

## 2021-11-20 MED ORDER — DEXAMETHASONE SODIUM PHOSPHATE 10 MG/ML IJ SOLN
INTRAMUSCULAR | Status: DC | PRN
Start: 2021-11-20 — End: 2021-11-20
  Administered 2021-11-20: 4 mg via INTRAVENOUS

## 2021-11-20 MED ORDER — ACETAMINOPHEN 10 MG/ML IV SOLN: 1000.0000 mg | Freq: Once | INTRAVENOUS | Status: DC | PRN

## 2021-11-20 MED ORDER — ACETAMINOPHEN 500 MG PO TABS
1000.0000 mg | ORAL_TABLET | ORAL | Status: AC
  Administered 2021-11-20: 1000 mg via ORAL
  Filled 2021-11-20: qty 2

## 2021-11-20 MED ORDER — FENTANYL CITRATE (PF) 100 MCG/2ML IJ SOLN
INTRAMUSCULAR | Status: DC | PRN
Start: 2021-11-20 — End: 2021-11-20
  Administered 2021-11-20 (×5): 50 ug via INTRAVENOUS

## 2021-11-20 MED ORDER — LIDOCAINE HCL 1 % IJ SOLN
INTRAMUSCULAR | Status: DC | PRN
  Administered 2021-11-20: 10 mL

## 2021-11-20 MED ORDER — LIDOCAINE 2% (20 MG/ML) 5 ML SYRINGE
INTRAMUSCULAR | Status: DC | PRN
  Administered 2021-11-20: 100 mg via INTRAVENOUS

## 2021-11-20 MED ORDER — METHYLERGONOVINE MALEATE 0.2 MG/ML IJ SOLN
0.2000 mg | Freq: Once | INTRAMUSCULAR | Status: AC
Start: 2021-11-20 — End: 2021-11-20
  Administered 2021-11-20: 0.2 mg via INTRAMUSCULAR
  Filled 2021-11-20: qty 1

## 2021-11-20 MED ORDER — ACETAMINOPHEN 325 MG PO TABS
650.0000 mg | ORAL_TABLET | Freq: Four times a day (QID) | ORAL | Status: DC | PRN
  Administered 2021-11-20 – 2021-11-21 (×2): 650 mg via ORAL
  Filled 2021-11-20: qty 2

## 2021-11-20 MED ORDER — ONDANSETRON HCL 4 MG/2ML IJ SOLN
INTRAMUSCULAR | Status: DC | PRN
  Administered 2021-11-20: 4 mg via INTRAVENOUS

## 2021-11-20 MED ORDER — FENTANYL CITRATE (PF) 250 MCG/5ML IJ SOLN
INTRAMUSCULAR | Status: AC
  Filled 2021-11-20: qty 5

## 2021-11-20 MED ORDER — ONDANSETRON HCL 4 MG/2ML IJ SOLN: 4.0000 mg | Freq: Four times a day (QID) | INTRAMUSCULAR | Status: DC | PRN

## 2021-11-20 MED ORDER — OXYCODONE HCL 5 MG PO TABS: 5.0000 mg | ORAL_TABLET | Freq: Four times a day (QID) | ORAL | 0 refills | Status: DC | PRN

## 2021-11-20 MED ORDER — FENTANYL CITRATE (PF) 100 MCG/2ML IJ SOLN
INTRAMUSCULAR | Status: AC
  Filled 2021-11-20: qty 2

## 2021-11-20 MED ORDER — POVIDONE-IODINE 10 % EX SWAB
2.0000 "application " | Freq: Once | CUTANEOUS | Status: AC
  Administered 2021-11-20: 2 via TOPICAL

## 2021-11-20 MED ORDER — PROPOFOL 10 MG/ML IV BOLUS
INTRAVENOUS | Status: AC
  Filled 2021-11-20: qty 20

## 2021-11-20 MED ORDER — PROMETHAZINE HCL 25 MG/ML IJ SOLN
6.2500 mg | INTRAMUSCULAR | Status: DC | PRN
  Administered 2021-11-20: 6.25 mg via INTRAVENOUS

## 2021-11-20 MED ORDER — CHLORHEXIDINE GLUCONATE 0.12 % MT SOLN
15.0000 mL | Freq: Once | OROMUCOSAL | Status: AC
  Administered 2021-11-20: 15 mL via OROMUCOSAL
  Filled 2021-11-20: qty 15

## 2021-11-20 MED ORDER — DIPHENHYDRAMINE HCL 50 MG/ML IJ SOLN
INTRAMUSCULAR | Status: DC | PRN
Start: 2021-11-20 — End: 2021-11-20
  Administered 2021-11-20: 12.5 mg via INTRAVENOUS

## 2021-11-20 MED ORDER — ONDANSETRON HCL 4 MG/2ML IJ SOLN
INTRAMUSCULAR | Status: AC
  Filled 2021-11-20: qty 2

## 2021-11-20 MED ORDER — KETOROLAC TROMETHAMINE 30 MG/ML IJ SOLN
INTRAMUSCULAR | Status: DC | PRN
  Administered 2021-11-20: 30 mg via INTRAVENOUS

## 2021-11-20 MED ORDER — SODIUM CHLORIDE 0.9 % IR SOLN
Status: DC | PRN
  Administered 2021-11-20: 3000 mL

## 2021-11-20 MED ORDER — LIDOCAINE HCL (PF) 1 % IJ SOLN
INTRAMUSCULAR | Status: AC
  Filled 2021-11-20: qty 30

## 2021-11-20 MED ORDER — TRANEXAMIC ACID-NACL 1000-0.7 MG/100ML-% IV SOLN
1000.0000 mg | INTRAVENOUS | Status: AC
  Administered 2021-11-20: 1000 mg via INTRAVENOUS
  Filled 2021-11-20: qty 100

## 2021-11-20 MED ORDER — MIDAZOLAM HCL 2 MG/2ML IJ SOLN
INTRAMUSCULAR | Status: AC
  Filled 2021-11-20: qty 2

## 2021-11-20 MED ORDER — LIDOCAINE-EPINEPHRINE 1 %-1:100000 IJ SOLN
INTRAMUSCULAR | Status: AC
  Filled 2021-11-20: qty 1

## 2021-11-20 MED ORDER — FENTANYL CITRATE (PF) 100 MCG/2ML IJ SOLN
25.0000 ug | INTRAMUSCULAR | Status: DC | PRN
  Administered 2021-11-20 (×2): 50 ug via INTRAVENOUS

## 2021-11-20 MED ORDER — PROMETHAZINE HCL 25 MG/ML IJ SOLN
INTRAMUSCULAR | Status: AC
  Filled 2021-11-20: qty 1

## 2021-11-20 MED ORDER — PROPOFOL 10 MG/ML IV BOLUS
INTRAVENOUS | Status: DC | PRN
Start: 2021-11-20 — End: 2021-11-20
  Administered 2021-11-20: 200 mg via INTRAVENOUS

## 2021-11-20 MED ORDER — ORAL CARE MOUTH RINSE: 15.0000 mL | Freq: Once | OROMUCOSAL | Status: AC

## 2021-11-20 MED ORDER — KETOROLAC TROMETHAMINE 30 MG/ML IJ SOLN
INTRAMUSCULAR | Status: AC
  Filled 2021-11-20: qty 2

## 2021-11-20 MED ORDER — DEXAMETHASONE SODIUM PHOSPHATE 10 MG/ML IJ SOLN
INTRAMUSCULAR | Status: AC
  Filled 2021-11-20: qty 1

## 2021-11-20 MED ORDER — GLYCOPYRROLATE PF 0.2 MG/ML IJ SOSY
PREFILLED_SYRINGE | INTRAMUSCULAR | Status: AC
  Filled 2021-11-20: qty 1

## 2021-11-20 MED ORDER — DIPHENHYDRAMINE HCL 50 MG/ML IJ SOLN
INTRAMUSCULAR | Status: AC
  Filled 2021-11-20: qty 2

## 2021-11-20 SURGICAL SUPPLY — 15 items
CANISTER SUCT 3000ML PPV (MISCELLANEOUS) ×2 IMPLANT
CATH ROBINSON RED A/P 16FR (CATHETERS) ×2 IMPLANT
DILATOR CANAL MILEX (MISCELLANEOUS) ×1 IMPLANT
GAUZE 4X4 16PLY ~~LOC~~+RFID DBL (SPONGE) ×2 IMPLANT
GLOVE SURG LTX SZ7 (GLOVE) ×2 IMPLANT
GLOVE SURG UNDER POLY LF SZ7 (GLOVE) ×4 IMPLANT
GOWN STRL REUS W/ TWL LRG LVL3 (GOWN DISPOSABLE) ×2 IMPLANT
GOWN STRL REUS W/TWL LRG LVL3 (GOWN DISPOSABLE) ×4
PACK VAGINAL MINOR WOMEN LF (CUSTOM PROCEDURE TRAY) ×2 IMPLANT
PAD OB MATERNITY 4.3X12.25 (PERSONAL CARE ITEMS) ×2 IMPLANT
SET GENESYS HTA PROCERVA (MISCELLANEOUS) ×2 IMPLANT
SUT VIC AB 2-0 SH 27 (SUTURE) ×4
SUT VIC AB 2-0 SH 27XBRD (SUTURE) IMPLANT
TOWEL GREEN STERILE FF (TOWEL DISPOSABLE) ×4 IMPLANT
UNDERPAD 30X36 HEAVY ABSORB (UNDERPADS AND DIAPERS) ×2 IMPLANT

## 2021-11-20 NOTE — Interval H&P Note (Signed)
History and Physical Interval Note:  11/20/2021 11:42 AM  Angela Castillo  has presented today for surgery, with the diagnosis of AUB.  The various methods of treatment have been discussed with the patient and family. After consideration of risks, benefits and other options for treatment, the patient has consented to  Procedure(s) with comments: DILATATION & CURETTAGE/HYSTEROSCOPY WITH HYDROTHERMAL ABLATION (N/A) - rep will be here confirmed on 11/12/21 CS as a surgical intervention.  The patient's history has been reviewed, patient examined, no change in status, stable for surgery.  I have reviewed the patient's chart and labs.  Questions were answered to the patient's satisfaction.     [name]

## 2021-11-20 NOTE — Progress Notes (Addendum)
1449 patient had ~400cc of blood with clot and saturated two peri-pads. VSS, patient alert and oriented. Shawnie Pons, MD and Charlotta Newton, MD notified. Charlotta Newton, MD at bedside to assess patient. No new orders.  1515 patient continued to have bleeding and clots. VSS and patient alert and oriented. Charlotta Newton, MD called to bedside again to re-assess patient. Ozan, MD performed vaginal packing at bedside. Charlotta Newton, MD updated Shawnie Pons, MD. See new orders in [facility].   1530 Charlotta Newton, MD called to inform me that she and Shawnie Pons, MD decided to admit the patient for observation overnight.   1535 Patient's spouse, Casimiro Needle updated and aware of situation. Michael at bedside to visit patient.

## 2021-11-20 NOTE — Progress Notes (Addendum)
Called to bedside due to vaginal bleeding.  Pt noted to have ~ 300cc of blood with clot and saturated pad.  Vaginal sweep completed ~ 100cc of blood and clots noted.  No active vaginal bleeding noted.  Vital signs remain stable. Pt alert and oriented.  Abdomen- obese and soft.  Plan to closely monitor at this time.     Returned to bedside around 1500 due to increased bleeding.  Speculum exam completed- cervix visualized- small clot noted at cervical os, no active bleeding from os noted.  Stitching on left side wall/cervix noted with bleeding.  Packing placed without difficulty.  Reviewed with Dr. Kennon Rounds- plan to monitor overnight with packing.  Janyth Pupa, DO Attending [place], [facility] for [employer], [place]

## 2021-11-20 NOTE — Op Note (Signed)
Preoperative diagnosis: Menorrhagia  Postoperative diagnosis: Menorrhagia  Procedure: HTA endometrial ablation, hysteroscopy, D & C  Surgeon: Standley Dakins. Kennon Rounds, M.D.  Anesthesia: GETT  Findings: Normal appearing cervix and uterine cavity with both ostia seen.  Estimated blood loss: Minimal  Specimens: Endometrial curettings  Disposition of specimen: Pathology  Reason for procedure: Patient is a 46 y.o. [id number] with abnormal bleeding wanting ablation.   Procedure: Patient was taken to the OR where she was placed in dorsal lithotomy in [place]. SCDs were in place. An adequate timeout was obtained. The patient was prepped and draped in the usual sterile fashion. A red rubber catheter is used to drain her bladder. A speculum was placed inside the vagina and the cervix visualized. The cervix was grasped anteriorly with a single-tooth tenaculum. 20 cc of quarter percent Marcaine were injected for paracervical block. The uterus sounded to 10 cm. Sequential dilation  after use of an os finder was done to a #23 dilator, and the HTA with hysteroscope was introduced into the uterine cavity. The cervix was ripped in this process.The cervix and endometrial lining appeared normal both ostia were seen there was no deformity of the cavity. A seal test was done and was passed. The uterine cavity was heated to between 80 and 90C and HTA was performed for 10 minutes. Cooling was then performed and the instrument removed. Sharp curettage was then performed and sample sent to pathology. The cervical laceration was anterior and was repaired with 3-0 vicryl on an SH in a running fashion. All instrument, needle, and lap counts were correct x2. The patient was awakened taken to recovery room in stable condition.  [name] MD 11/20/2021 1:12 PM

## 2021-11-20 NOTE — Progress Notes (Signed)
Called to bedside- packing seems to be falling out.  Packing reapplied; however, bleeding noted around packing with continued oozing.  Packing removed.  Speculum exam completed- no source of active bleeding seen.  As per prior exam- prior cervical stitches noted, but no active bleeding seen.  Surgical-Flo placed with some difficulty due to the nature of the product and vaginal application.  Additional packing (4x4- 2) placed in vaginal vault.    Dr. Elly Modena present at bedside for transition of care and observation of cervical exam.  Vitals remains stable.  Will plan to closely monitor bleeding overnight.  CBC ordered in am  Janyth Pupa, DO Attending [place], [facility] for [facility], [place]

## 2021-11-20 NOTE — Transfer of Care (Signed)
Immediate Anesthesia Transfer of Care Note  Patient: Angela Castillo  Procedure(s) Performed: DILATATION & CURETTAGE/HYSTEROSCOPY WITH HYDROTHERMAL ABLATION (Uterus)  Patient Location: PACU  Anesthesia Type:General  Level of Consciousness: awake, alert  and oriented  Airway & Oxygen Therapy: Patient Spontanous Breathing and Patient connected to face mask oxygen  Post-op Assessment: Report given to RN and Post -op Vital signs reviewed and stable  Post vital signs: Reviewed and stable  Last Vitals:  Vitals Value Taken Time  BP 130/84 11/20/21 1319  Temp    Pulse 70 11/20/21 1323  Resp 11 11/20/21 1323  SpO2 100 % 11/20/21 1323  Vitals shown include unvalidated device data.  Last Pain:  Vitals:   11/20/21 0919  TempSrc:   PainSc: 0-No pain         Complications: No notable events documented.

## 2021-11-20 NOTE — Anesthesia Preprocedure Evaluation (Signed)
Anesthesia Evaluation  Patient identified by MRN, date of birth, ID band Patient awake    Reviewed: Allergy & Precautions, NPO status , Patient's Chart, lab work & pertinent test results  Airway Mallampati: II  TM Distance: >3 FB Neck ROM: Full    Dental no notable dental hx.    Pulmonary neg pulmonary ROS,    Pulmonary exam normal        Cardiovascular hypertension, Pt. on medications and Pt. on home beta blockers  Rhythm:Regular Rate:Normal     Neuro/Psych  Headaches, negative psych ROS   GI/Hepatic GERD  Medicated,(+) Cirrhosis       ,   Endo/Other  diabetes, Type 2  Renal/GU negative Renal ROS   AUB  negative genitourinary   Musculoskeletal  (+) Arthritis , Osteoarthritis,    Abdominal Normal abdominal exam  (+)   Peds  Hematology  (+) anemia ,   Anesthesia Other Findings   Reproductive/Obstetrics                             Anesthesia Physical Anesthesia Plan  ASA: 3  Anesthesia Plan: General   Post-op Pain Management:    Induction: Intravenous  PONV Risk Score and Plan: 3 and Ondansetron, Dexamethasone, Midazolam and Treatment may vary due to age or medical condition  Airway Management Planned: Mask and LMA  Additional Equipment: None  Intra-op Plan:   Post-operative Plan: Extubation in OR  Informed Consent: I have reviewed the patients History and Physical, chart, labs and discussed the procedure including the risks, benefits and alternatives for the proposed anesthesia with the patient or authorized representative who has indicated his/her understanding and acceptance.     Dental advisory given  Plan Discussed with: CRNA  Anesthesia Plan Comments: (  Lab Results      Component                Value               Date                      NA                       139                 11/07/2020                K                        3.8                  11/07/2020                CO2                      27                  11/07/2020                GLUCOSE                  75                  11/07/2020                BUN  10                  11/07/2020                CREATININE               0.65                11/07/2020                CALCIUM                  9.4                 11/07/2020                GFRNONAA                 108                 11/07/2020          )        Anesthesia Quick Evaluation

## 2021-11-20 NOTE — Anesthesia Procedure Notes (Signed)
Procedure Name: LMA Insertion Date/Time: 11/20/2021 12:09 PM Performed by: Pearson Grippe, CRNA Pre-anesthesia Checklist: Patient identified, Emergency Drugs available, Suction available and Patient being monitored Patient Re-evaluated:Patient Re-evaluated prior to induction Oxygen Delivery Method: Circle system utilized Preoxygenation: Pre-oxygenation with 100% oxygen Induction Type: IV induction Ventilation: Mask ventilation without difficulty LMA: LMA inserted LMA Size: 4.0 Number of attempts: 1 Airway Equipment and Method: Bite block Placement Confirmation: positive ETCO2 Tube secured with: Tape Dental Injury: Teeth and Oropharynx as per pre-operative assessment

## 2021-11-21 ENCOUNTER — Other Ambulatory Visit: Payer: Self-pay

## 2021-11-21 ENCOUNTER — Encounter (HOSPITAL_COMMUNITY): Payer: Self-pay | Admitting: Family Medicine

## 2021-11-21 DIAGNOSIS — N920 Excessive and frequent menstruation with regular cycle: Secondary | ICD-10-CM | POA: Diagnosis not present

## 2021-11-21 LAB — CBC
HCT: 34.9 % — ABNORMAL LOW (ref 36.0–46.0)
Hemoglobin: 10.8 g/dL — ABNORMAL LOW (ref 12.0–15.0)
MCH: 27.7 pg (ref 26.0–34.0)
MCHC: 30.9 g/dL (ref 30.0–36.0)
MCV: 89.5 fL (ref 80.0–100.0)
Platelets: 97 10*3/uL — ABNORMAL LOW (ref 150–400)
RBC: 3.9 MIL/uL (ref 3.87–5.11)
RDW: 14.4 % (ref 11.5–15.5)
WBC: 8.6 10*3/uL (ref 4.0–10.5)
nRBC: 0 % (ref 0.0–0.2)

## 2021-11-21 LAB — SURGICAL PATHOLOGY

## 2021-11-21 NOTE — Progress Notes (Signed)
Out in wheelchair with husband 

## 2021-11-21 NOTE — Discharge Summary (Signed)
Physician Discharge Summary  Patient ID: Angela Castillo MRN: [id number] DOB/AGE: 46-Mar-1977 46 y.o.  Admit date: 11/20/2021 Discharge date:   Admission Diagnoses:  Principal Problem:   Menorrhagia Active Problems:   Abnormal uterine bleeding   Discharge Diagnoses:  Same  Past Medical History:  Diagnosis Date   Anemia Dx January 03 2015   Diabetes mellitus without complication ([place])    Elevated liver enzymes    Essential hypertension Dx Apr 2016   Fatty liver    H/O: upper GI bleed 03/2019    variceal bleed    Headache    History of blood transfusion 03/2019   Liver cirrhosis secondary to NASH ([place])    Pneumonia 03/2019   Reflux    Thrombocytopenia ([place])     Surgeries: Procedure(s): DILATATION & CURETTAGE/HYSTEROSCOPY WITH HYDROTHERMAL ABLATION on 11/20/2021   Consultants: None  Discharged Condition: Improved  Hospital Course: Angela Castillo is an 46 y.o. female G2P2002 who was admitted 11/20/2021 with a chief complaint of abnormal bleeding, and found to have a diagnosis of Menorrhagia.  They were brought to the operating room on 11/20/2021 and underwent the above named procedures.   Postooperative bleeding from cervical laceration responded to Surgi-flo. F/u CBC with mild acute blood loss anemia.  She was given sequential compression devices, early ambulation, and chemoprophylaxis for DVT prophylaxis.  She benefited maximally from their hospital stay and there were no complications. She was ambulating, voiding, tolerating po and deemed stable for discharge.  Recent vital signs:  Vitals:   11/20/21 2339 11/21/21 0534  BP: 120/74 123/72  Pulse: 83 80  Resp: 16 16  Temp: 98.3 F (36.8 C) 98.1 F (36.7 C)  SpO2: 99% 98%   Discharge exam: Physical Examination: General appearance - alert, well appearing, and in no distress Chest - normal effort Heart - normal rate and regular rhythm Abdomen - soft, appropriately tender, dressing is clean and  dry Extremities - peripheral pulses normal, no pedal edema, no clubbing or cyanosis, Homan's sign negative bilaterally Recent laboratory studies:  Results for orders placed or performed during the hospital encounter of 26/71/24  Basic metabolic panel per protocol  Result Value Ref Range   Sodium 138 135 - 145 mmol/L   Potassium 3.9 3.5 - 5.1 mmol/L   Chloride 105 98 - 111 mmol/L   CO2 21 (L) 22 - 32 mmol/L   Glucose, Bld 128 (H) 70 - 99 mg/dL   BUN 12 6 - 20 mg/dL   Creatinine, Ser 0.57 0.44 - 1.00 mg/dL   Calcium 9.0 8.9 - 10.3 mg/dL   GFR, Estimated >60 >60 mL/min   Anion gap 12 5 - 15  CBC  Result Value Ref Range   WBC 5.6 4.0 - 10.5 K/uL   RBC 4.72 3.87 - 5.11 MIL/uL   Hemoglobin 13.2 12.0 - 15.0 g/dL   HCT 41.9 36.0 - 46.0 %   MCV 88.8 80.0 - 100.0 fL   MCH 28.0 26.0 - 34.0 pg   MCHC 31.5 30.0 - 36.0 g/dL   RDW 14.4 11.5 - 15.5 %   Platelets 96 (L) 150 - 400 K/uL   nRBC 0.0 0.0 - 0.2 %  Glucose, capillary  Result Value Ref Range   Glucose-Capillary 129 (H) 70 - 99 mg/dL  Glucose, capillary  Result Value Ref Range   Glucose-Capillary 114 (H) 70 - 99 mg/dL  CBC  Result Value Ref Range   WBC 8.6 4.0 - 10.5 K/uL   RBC 3.90 3.87 - 5.11  MIL/uL   Hemoglobin 10.8 (L) 12.0 - 15.0 g/dL   HCT 34.9 (L) 36.0 - 46.0 %   MCV 89.5 80.0 - 100.0 fL   MCH 27.7 26.0 - 34.0 pg   MCHC 30.9 30.0 - 36.0 g/dL   RDW 14.4 11.5 - 15.5 %   Platelets 97 (L) 150 - 400 K/uL   nRBC 0.0 0.0 - 0.2 %  Pregnancy, urine POC  Result Value Ref Range   Preg Test, Ur NEGATIVE NEGATIVE    Discharge Medications:   Allergies as of 11/21/2021       Reactions   Nsaids    GI BLEED        Medication List     STOP taking these medications    norethindrone 5 MG tablet Commonly known as: Aygestin       TAKE these medications    acetaminophen 500 MG tablet Commonly known as: TYLENOL Take 500-1,000 mg by mouth every 6 (six) hours as needed for mild pain.   albuterol 108 (90 Base) MCG/ACT  inhaler Commonly known as: VENTOLIN HFA INHALE 2 PUFFS INTO THE LUNGS EVERY 6 HOURS AS NEEDED FOR COUGH   amitriptyline 25 MG tablet Commonly known as: ELAVIL TAKE 1 TABLET(25 MG) BY MOUTH AT BEDTIME   benzonatate 200 MG capsule Commonly known as: TESSALON TAKE 1 CAPSULE BY MOUTH 2 TIMES DAILY AS NEEDED FOR COUGH   Biotin 10000 MCG Tabs Take 10,000 mcg by mouth daily.   blood glucose meter kit and supplies Kit Dispense based on patient and insurance preference. Use up to four times daily as directed. (FOR ICD-9 250.00, 250.01). Dispense Relion   cephALEXin 500 MG capsule Commonly known as: KEFLEX Take 1 capsule (500 mg total) by mouth 2 (two) times daily.   Contour Next Test test strip Generic drug: glucose blood USE UP TO FOUR TIMES DAILY AS DIRECTED   dapagliflozin propanediol 5 MG Tabs tablet Commonly known as: Farxiga Take 1 tablet (5 mg total) by mouth daily before breakfast.   ferrous sulfate 325 (65 FE) MG tablet Take 325 mg by mouth in the morning and at bedtime.   fluticasone 50 MCG/ACT nasal spray Commonly known as: FLONASE SHAKE LIQUID AND USE 1 SPRAY IN EACH NOSTRIL DAILY AS NEEDED FOR ALLERGIES OR EAR PRESSURE   Icy Hot Medicated Spray 16 % Liqd Generic drug: Menthol (Topical Analgesic) Apply 1 spray topically daily as needed (pain).   ketoconazole 2 % cream Commonly known as: NIZORAL Apply 1 application topically daily as needed for irritation.   losartan-hydrochlorothiazide 50-12.5 MG tablet Commonly known as: HYZAAR Take 1 tablet by mouth daily.   metFORMIN 500 MG tablet Commonly known as: GLUCOPHAGE TAKE 1 TABLET BY MOUTH 2 TIMES DAILY WITH A MEAL.   Microlet Lancets Misc USE UP TO FOUR TIMES DAILY AS DIRECTED   mupirocin ointment 2 % Commonly known as: BACTROBAN APPLY TOPICALLY TWICE A DAY   ondansetron 4 MG disintegrating tablet Commonly known as: ZOFRAN-ODT Take 4 mg by mouth every 8 (eight) hours as needed for vomiting or nausea.    oxyCODONE 5 MG immediate release tablet Commonly known as: Oxy IR/ROXICODONE Take 1 tablet (5 mg total) by mouth every 6 (six) hours as needed for severe pain.   pantoprazole 40 MG tablet Commonly known as: PROTONIX Take 40 mg by mouth daily.   propranolol 20 MG tablet Commonly known as: INDERAL TAKE 1 TABLET BY MOUTH TWICE A DAY   vitamin C 500 MG tablet Commonly known as:  ASCORBIC ACID Take 500 mg by mouth 2 (two) times daily.        Diagnostic Studies: DG Hand Complete Right  Result Date: 11/15/2021 CLINICAL DATA:  right thumb lac and pain EXAM: RIGHT HAND - COMPLETE 3+ VIEW COMPARISON:  None. FINDINGS: There is no evidence of acute fracture or dislocation. No bony erosions. No significant arthropathy. There is no evidence of radiopaque foreign body. IMPRESSION: No acute osseous abnormality or evidence of radiopaque foreign body. Electronically Signed   By: Maurine Simmering M.D.   On: 11/15/2021 18:20    Disposition: Discharge disposition: 01-Home or Self Care       Discharge Instructions      Remove dressing in 72 hours   Complete by: As directed    Call MD for:  persistant nausea and vomiting   Complete by: As directed    Call MD for:  redness, tenderness, or signs of infection (pain, swelling, redness, odor or green/yellow discharge around incision site)   Complete by: As directed    Call MD for:  severe uncontrolled pain   Complete by: As directed    Call MD for:  temperature >100.4   Complete by: As directed    Diet - low sodium heart healthy   Complete by: As directed    Discharge patient   Complete by: As directed    Discharge disposition: 01-Home or Self Care   Discharge patient date: 11/21/2021   Driving Restrictions   Complete by: As directed    None while taking narcotic pain meds   Increase activity slowly   Complete by: As directed    Sexual Activity Restrictions   Complete by: As directed    None x 2-3 weeks        Follow-up [place] for [place] at [facility] Follow up.   Specialty: Obstetrics and Gynecology Why: postop check, they will call you with an appointment Contact information: [place] [address] [phone]                 Signed: [name] 11/21/2021, 7:24 AM

## 2021-11-21 NOTE — Progress Notes (Signed)
Patient ID: Angela Castillo, female   DOB: [birth year], 46 y.o.   MRN: [id number] No further bleeding after surgi-flow Packing removed

## 2021-11-21 NOTE — Anesthesia Postprocedure Evaluation (Signed)
Anesthesia Post Note  Patient: Avangeline Stockburger  Procedure(s) Performed: DILATATION & CURETTAGE/HYSTEROSCOPY WITH HYDROTHERMAL ABLATION (Uterus)     Patient location during evaluation: PACU Anesthesia Type: General Level of consciousness: awake and alert Pain management: pain level controlled Vital Signs Assessment: post-procedure vital signs reviewed and stable Respiratory status: spontaneous breathing, nonlabored ventilation, respiratory function stable and patient connected to nasal cannula oxygen Cardiovascular status: blood pressure returned to baseline and stable Postop Assessment: no apparent nausea or vomiting Anesthetic complications: no   No notable events documented.  Last Vitals:  Vitals:   11/21/21 0534 11/21/21 0753  BP: 123/72 124/69  Pulse: 80 70  Resp: 16 18  Temp: 36.7 C 36.7 C  SpO2: 98% 98%    Last Pain:  Vitals:   11/21/21 0757  TempSrc:   PainSc: 0-No pain                 Kennieth Rad

## 2021-11-22 ENCOUNTER — Telehealth: Payer: Self-pay

## 2021-11-22 NOTE — Telephone Encounter (Signed)
Transition Care Management Follow-up Telephone Call Date of discharge and from where: 11/21/2021, [facility]  How have you been since you were released from the hospital? She said she is feeling pretty good, not much pain from surgical procedure. She does have some hip and neck pain she thinks is from being in bed more than usual and that pain has been managed with tylenol. 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 She said that the work is not strenuous.  Any questions or concerns? No  Items Reviewed: Did the pt receive and understand the discharge instructions provided? Yes  Medications obtained and verified? Yes  - she said she has all of her medications, the only new one is oxycodone.  She also has a glucometer Other? No  Any new allergies since your discharge? No  Dietary orders reviewed? No Do you have support at home?  Not addressed.   Home Care and Equipment/Supplies: Were home health services ordered? no If so, what is the name of the agency? N/a  Has the agency set up a time to come to the patient's home? not applicable Were any new equipment or medical supplies ordered?  No What is the name of the medical supply agency? N/a Were you able to get the supplies/equipment? not applicable Do you have any questions related to the use of the equipment or supplies? No  Functional Questionnaire: (I = Independent and D = Dependent) ADLs: independent  Follow up appointments reviewed:  PCP Hospital f/u appt confirmed? Yes  Scheduled to see Bertram Denver, NP on 12/05/2021.  Specialist Hospital f/u appt confirmed? Yes  Scheduled to see OB/GYN on 2/22023. Are transportation arrangements needed? No  If their condition worsens, is the pt aware to call PCP or go to the Emergency Dept.? Yes Was the patient provided with contact information for the PCP's office or ED? Yes Was to pt encouraged to call back with questions or concerns? Yes

## 2021-11-23 ENCOUNTER — Other Ambulatory Visit: Payer: Self-pay | Admitting: Family Medicine

## 2021-11-23 DIAGNOSIS — N920 Excessive and frequent menstruation with regular cycle: Secondary | ICD-10-CM

## 2021-11-30 ENCOUNTER — Other Ambulatory Visit: Payer: Self-pay | Admitting: Family Medicine

## 2021-11-30 DIAGNOSIS — N920 Excessive and frequent menstruation with regular cycle: Secondary | ICD-10-CM

## 2021-12-05 ENCOUNTER — Encounter: Payer: Self-pay | Admitting: Nurse Practitioner

## 2021-12-05 ENCOUNTER — Other Ambulatory Visit: Payer: Self-pay

## 2021-12-05 ENCOUNTER — Ambulatory Visit: Payer: 59 | Attending: Nurse Practitioner | Admitting: Nurse Practitioner

## 2021-12-05 VITALS — BP 130/85 | HR 81 | Ht 64.0 in | Wt 245.0 lb

## 2021-12-05 DIAGNOSIS — E119 Type 2 diabetes mellitus without complications: Secondary | ICD-10-CM

## 2021-12-05 DIAGNOSIS — R413 Other amnesia: Secondary | ICD-10-CM

## 2021-12-05 DIAGNOSIS — I10 Essential (primary) hypertension: Secondary | ICD-10-CM | POA: Diagnosis not present

## 2021-12-05 DIAGNOSIS — E785 Hyperlipidemia, unspecified: Secondary | ICD-10-CM | POA: Diagnosis not present

## 2021-12-05 NOTE — Progress Notes (Signed)
Assessment & Plan:  Angela Castillo was seen today for diabetes.  Diagnoses and all orders for this visit:  Type 2 diabetes mellitus without complication, without long-term current use of insulin (HCC) -     Hemoglobin A1c Continue blood sugar control as discussed in office today, low carbohydrate diet, and regular physical exercise as tolerated, 150 minutes per week (30 min each day, 5 days per week, or 50 min 3 days per week). Keep blood sugar logs with fasting goal of 90-130 mg/dl, post prandial (after you eat) less than 180.  For Hypoglycemia: BS <60 and Hyperglycemia BS >400; contact the clinic ASAP. Annual eye exams and foot exams are recommended.   Dyslipidemia, goal LDL below 70 -     Lipid panel INSTRUCTIONS: Work on a low fat, heart healthy diet and participate in regular aerobic exercise program by working out at least 150 minutes per week; 5 days a week-30 minutes per day. Avoid red meat/beef/steak,  fried foods. junk foods, sodas, sugary drinks, unhealthy snacking, alcohol and smoking.  Drink at least 80 oz of water per day and monitor your carbohydrate intake daily.    Essential hypertension Continue all antihypertensives as prescribed.  Remember to bring in your blood pressure log with you for your follow up appointment.  DASH/Mediterranean Diets are healthier choices for HTN.    Short-term memory loss Dietary modifications discussed May require neurology referral     Patient has been counseled on age-appropriate routine health concerns for screening and prevention. These are reviewed and up-to-date. Referrals have been placed accordingly. Immunizations are up-to-date or declined.    Subjective:   Chief Complaint  Patient presents with   Diabetes   HPI Angela Castillo 46 y.o. female presents to office for follow up to  DM and HTN.  She has a past medical history of Anemia (Dx January 03 2015), DM2, Elevated liver enzymes, Essential hypertension (Dx Apr 2016), Fatty  liver, H/O: upper GI bleed (03/2019),  Liver cirrhosis secondary to NASH, Pneumonia (03/2019), Reflux, and Thrombocytopenia ([place]).   Short Term Memory Loss NotesForgetful Short term memory loss Hard to get people to cover your shift.   HTN Blood presusre is well controlled today. BP Readings from Last 3 Encounters:  12/05/21 130/85  11/21/21 124/69  11/16/21 (!) 144/97    DM 2 A1c slightly increased from 6.2 to 7.0. Will continue on metformin 500 mg BID and farxiga 5 mg daily. If continues elevated will need to increase farxiga to 10 mg. LDL not at goal. Will add low dose lipid lowering agent.  Lab Results  Component Value Date   HGBA1C 7.0 (H) 12/05/2021    Lab Results  Component Value Date   LDLCALC 104 (H) 12/05/2021   Short Term memory Loss We discussed how diet and stress can affect memory. I would like for her to work on decreasing her carb and cholesterol intake to see if this will improve. She endorses memory loss onset a few years ago. If no improvement will refer to neurology for further testing.    Review of Systems  Constitutional:  Negative for fever, malaise/fatigue and weight loss.  HENT: Negative.  Negative for nosebleeds.   Eyes: Negative.  Negative for blurred vision, double vision and photophobia.  Respiratory: Negative.  Negative for cough and shortness of breath.   Cardiovascular: Negative.  Negative for chest pain, palpitations and leg swelling.  Gastrointestinal: Negative.  Negative for heartburn, nausea and vomiting.  Musculoskeletal: Negative.  Negative for myalgias.  Neurological: Negative.  Negative for dizziness, focal weakness, seizures and headaches.  Psychiatric/Behavioral:  Positive for memory loss. Negative for suicidal ideas.    Past Medical History:  Diagnosis Date   Anemia Dx January 03 2015   Diabetes mellitus without complication ([place])    Elevated liver enzymes    Essential hypertension Dx Apr 2016   Fatty liver    H/O: upper GI bleed  03/2019    variceal bleed    Headache    History of blood transfusion 03/2019   Liver cirrhosis secondary to NASH ([place])    Pneumonia 03/2019   Reflux    Thrombocytopenia (HCC)     Past Surgical History:  Procedure Laterality Date   DILITATION & CURRETTAGE/HYSTROSCOPY WITH HYDROTHERMAL ABLATION N/A 11/20/2021   Procedure: DILATATION & CURETTAGE/HYSTEROSCOPY WITH HYDROTHERMAL ABLATION;  Surgeon: [name], MD;  Location: [facility];  Service: Gynecology;  Laterality: N/A;  rep will be here confirmed on 11/12/21 CS   ESOPHAGEAL BANDING  03/22/2019   Procedure: ESOPHAGEAL BANDING;  Surgeon: Wilford Corner, MD;  [facility];  Service: Endoscopy;;   ESOPHAGEAL BANDING  03/23/2019   Procedure: ESOPHAGEAL BANDING;  Surgeon: Wilford Corner, MD;  [facility];  Service: Endoscopy;;   ESOPHAGEAL BANDING  09/20/2019   Procedure: ESOPHAGEAL BANDING;  Surgeon: Wilford Corner, MD;  [facility];  Service: Endoscopy;;   ESOPHAGEAL BANDING N/A 12/21/2019   Procedure: ESOPHAGEAL BANDING;  Surgeon: Wilford Corner, MD;  [facility];  Service: Endoscopy;  Laterality: N/A;   ESOPHAGOGASTRODUODENOSCOPY N/A 03/22/2019   Procedure: ESOPHAGOGASTRODUODENOSCOPY (EGD);  Surgeon: Wilford Corner, MD;  [facility];  Service: Endoscopy;  Laterality: N/A;   ESOPHAGOGASTRODUODENOSCOPY N/A 03/23/2019   Procedure: ESOPHAGOGASTRODUODENOSCOPY (EGD);  Surgeon: Wilford Corner, MD;  [facility];  Service: Endoscopy;  Laterality: N/A;   ESOPHAGOGASTRODUODENOSCOPY (EGD) WITH PROPOFOL N/A 07/23/2019   Procedure: ESOPHAGOGASTRODUODENOSCOPY (EGD) WITH PROPOFOL with possible banding;  Surgeon: Wilford Corner, MD;  [facility];  Service: Endoscopy;  Laterality: N/A;   ESOPHAGOGASTRODUODENOSCOPY (EGD) WITH PROPOFOL N/A 09/20/2019   Procedure: ESOPHAGOGASTRODUODENOSCOPY (EGD) WITH PROPOFOL;  Surgeon: Wilford Corner, MD;  [facility];  Service: Endoscopy;  Laterality: N/A;   ESOPHAGOGASTRODUODENOSCOPY (EGD) WITH PROPOFOL N/A 12/21/2019   Procedure: ESOPHAGOGASTRODUODENOSCOPY (EGD) WITH PROPOFOL with possible banding;  Surgeon: Wilford Corner, MD;  [facility];  Service: Endoscopy;  Laterality: N/A;   HERNIA REPAIR     TUBAL LIGATION  07/25/2000   VENTRAL HERNIA REPAIR N/A 07/06/2020   Procedure: VENTRAL HERNIA REPAIR WITH MESH;  Surgeon: Coralie Keens, MD;  Location: [facility];  Service: General;  Laterality: N/A;    Family History  Problem Relation Age of Onset   Diabetes Father    COPD Mother    Esophageal cancer Mother    Diabetes Mother    Cancer Mother        esophageal   Hypertension Mother    Breast cancer Maternal Aunt     Social History Reviewed with no changes to be made today.   Outpatient Medications Prior to Visit  Medication Sig Dispense Refill   acetaminophen (TYLENOL) 500 MG tablet Take 500-1,000 mg by mouth every 6 (six) hours as needed for mild pain.     albuterol (VENTOLIN HFA) 108 (90 Base) MCG/ACT inhaler INHALE 2 PUFFS INTO THE LUNGS EVERY 6 HOURS AS NEEDED FOR COUGH 8.5 each 1   amitriptyline (ELAVIL) 25 MG tablet TAKE 1 TABLET(25 MG) BY MOUTH AT BEDTIME 90 tablet 0   benzonatate (  TESSALON) 200 MG capsule TAKE 1 CAPSULE BY MOUTH 2 TIMES DAILY AS NEEDED FOR COUGH 20 capsule 0   Biotin 10000 MCG TABS Take 10,000 mcg by mouth daily.     blood glucose meter kit and supplies KIT Dispense based on patient and insurance preference. Use up to four times daily as directed. (FOR ICD-9 250.00, 250.01). Dispense Relion 1 each 0   dapagliflozin propanediol (FARXIGA) 5 MG TABS tablet Take 1 tablet (5 mg total) by mouth daily before breakfast. 90 tablet 1   ferrous sulfate 325 (65 FE) MG tablet Take 325 mg by mouth in the morning and at bedtime.     fluticasone (FLONASE) 50 MCG/ACT nasal spray SHAKE LIQUID AND USE 1 SPRAY IN EACH NOSTRIL DAILY AS NEEDED FOR ALLERGIES OR EAR  PRESSURE 16 g 1   glucose blood (CONTOUR NEXT TEST) test strip USE UP TO FOUR TIMES DAILY AS DIRECTED 100 strip 6   ketoconazole (NIZORAL) 2 % cream Apply 1 application topically daily as needed for irritation.     losartan-hydrochlorothiazide (HYZAAR) 50-12.5 MG tablet Take 1 tablet by mouth daily. 90 tablet 1   Menthol, Topical Analgesic, (ICY HOT MEDICATED SPRAY) 16 % LIQD Apply 1 spray topically daily as needed (pain).     metFORMIN (GLUCOPHAGE) 500 MG tablet TAKE 1 TABLET BY MOUTH 2 TIMES DAILY WITH A MEAL. 180 tablet 1   Microlet Lancets MISC USE UP TO FOUR TIMES DAILY AS DIRECTED 100 each 11   mupirocin ointment (BACTROBAN) 2 % APPLY TOPICALLY TWICE A DAY 22 g 0   ondansetron (ZOFRAN-ODT) 4 MG disintegrating tablet Take 4 mg by mouth every 8 (eight) hours as needed for vomiting or nausea.     oxyCODONE (OXY IR/ROXICODONE) 5 MG immediate release tablet Take 1 tablet (5 mg total) by mouth every 6 (six) hours as needed for severe pain. 8 tablet 0   pantoprazole (PROTONIX) 40 MG tablet Take 40 mg by mouth daily.     propranolol (INDERAL) 20 MG tablet TAKE 1 TABLET BY MOUTH TWICE A DAY 180 tablet 1   vitamin C (ASCORBIC ACID) 500 MG tablet Take 500 mg by mouth 2 (two) times daily.     cephALEXin (KEFLEX) 500 MG capsule Take 1 capsule (500 mg total) by mouth 2 (two) times daily. 14 capsule 0   norethindrone (AYGESTIN) 5 MG tablet TAKE 1 TABLET (5 MG TOTAL) BY MOUTH DAILY. 90 tablet 1   No facility-administered medications prior to visit.    Allergies  Allergen Reactions   Nsaids     GI BLEED       Objective:    BP 130/85    Pulse 81    Ht '5\' 4"'  (1.626 m)    Wt 245 lb (111.1 kg)    LMP 10/24/2021    SpO2 98%    BMI 42.05 kg/m  Wt Readings from Last 3 Encounters:  12/05/21 245 lb (111.1 kg)  11/20/21 244 lb (110.7 kg)  11/15/21 244 lb (110.7 kg)    Physical Exam Vitals and nursing note reviewed.  Constitutional:      Appearance: She is well-developed. She is obese.  HENT:      Head: Normocephalic and atraumatic.  Cardiovascular:     Rate and Rhythm: Normal rate and regular rhythm.     Heart sounds: Normal heart sounds. No murmur heard.   No friction rub. No gallop.  Pulmonary:     Effort: Pulmonary effort is normal. No tachypnea or respiratory distress.  Breath sounds: Normal breath sounds. No decreased breath sounds, wheezing, rhonchi or rales.  Chest:     Chest wall: No tenderness.  Abdominal:     General: Bowel sounds are normal.     Palpations: Abdomen is soft.  Musculoskeletal:        General: Normal range of motion.     Cervical back: Normal range of motion.  Skin:    General: Skin is warm and dry.  Neurological:     Mental Status: She is alert and oriented to person, place, and time.     Coordination: Coordination normal.  Psychiatric:        Behavior: Behavior normal. Behavior is cooperative.        Thought Content: Thought content normal.        Judgment: Judgment normal.         Patient has been counseled extensively about nutrition and exercise as well as the importance of adherence with medications and regular follow-up. The patient was given clear instructions to go to ER or return to medical center if symptoms don't improve, worsen or new problems develop. The patient verbalized understanding.   Follow-up: Return in about 6 months (around 06/04/2022).   Gildardo Pounds, FNP-BC [facility] [place], [place]   12/06/2021, 11:19 AM

## 2021-12-06 ENCOUNTER — Encounter: Payer: Self-pay | Admitting: Nurse Practitioner

## 2021-12-06 ENCOUNTER — Encounter: Payer: Self-pay | Admitting: Family Medicine

## 2021-12-06 ENCOUNTER — Telehealth (INDEPENDENT_AMBULATORY_CARE_PROVIDER_SITE_OTHER): Payer: 59 | Admitting: Family Medicine

## 2021-12-06 DIAGNOSIS — Z09 Encounter for follow-up examination after completed treatment for conditions other than malignant neoplasm: Secondary | ICD-10-CM | POA: Diagnosis not present

## 2021-12-06 DIAGNOSIS — N921 Excessive and frequent menstruation with irregular cycle: Secondary | ICD-10-CM

## 2021-12-06 LAB — LIPID PANEL
Chol/HDL Ratio: 7.6 ratio — ABNORMAL HIGH (ref 0.0–4.4)
Cholesterol, Total: 183 mg/dL (ref 100–199)
HDL: 24 mg/dL — ABNORMAL LOW (ref >39)
LDL Chol Calc (NIH): 104 mg/dL — ABNORMAL HIGH (ref 0–99)
Triglycerides: 321 mg/dL — ABNORMAL HIGH (ref 0–149)
VLDL Cholesterol Cal: 55 mg/dL — ABNORMAL HIGH (ref 5–40)

## 2021-12-06 LAB — HEMOGLOBIN A1C
Est. average glucose Bld gHb Est-mCnc: 154 mg/dL
Hgb A1c MFr Bld: 7 % — ABNORMAL HIGH (ref 4.8–5.6)

## 2021-12-06 MED ORDER — OMEGA-3-ACID ETHYL ESTERS 1 G PO CAPS: 1.0000 g | ORAL_CAPSULE | Freq: Two times a day (BID) | ORAL | 1 refills | Status: DC

## 2021-12-06 NOTE — Progress Notes (Signed)
GYNECOLOGY VIRTUAL VISIT ENCOUNTER NOTE  Provider location: [facility]   Patient location: Home  I connected with Angela Castillo on 12/06/21 at  2:55 PM EST by MyChart Video Encounter and verified that I am speaking with the correct person using two identifiers.   I discussed the limitations, risks, security and privacy concerns of performing an evaluation and management service virtually and the availability of in person appointments. I also discussed with the patient that there may be a patient responsible charge related to this service. The patient expressed understanding and agreed to proceed.   History:  Angela Castillo is a 46 y.o. [phone] female being evaluated today for post op check. She had some cramping and some spotting and discharge. She denies any abnormal vaginal discharge, pelvic pain or other concerns.       Past Medical History:  Diagnosis Date   Anemia Dx January 03 2015   Diabetes mellitus without complication ([place])    Elevated liver enzymes    Essential hypertension Dx Apr 2016   Fatty liver    H/O: upper GI bleed 03/2019    variceal bleed    Headache    History of blood transfusion 03/2019   Liver cirrhosis secondary to NASH ([place])    Pneumonia 03/2019   Reflux    Thrombocytopenia (HCC)    Past Surgical History:  Procedure Laterality Date   DILITATION & CURRETTAGE/HYSTROSCOPY WITH HYDROTHERMAL ABLATION N/A 11/20/2021   Procedure: DILATATION & CURETTAGE/HYSTEROSCOPY WITH HYDROTHERMAL ABLATION;  Surgeon: [name], MD;  Location: [facility];  Service: Gynecology;  Laterality: N/A;  rep will be here confirmed on 11/12/21 CS   ESOPHAGEAL BANDING  03/22/2019   Procedure: ESOPHAGEAL BANDING;  Surgeon: Wilford Corner, MD;  [facility];  Service: Endoscopy;;   ESOPHAGEAL BANDING  03/23/2019   Procedure: ESOPHAGEAL BANDING;  Surgeon: Wilford Corner, MD;  [facility];  Service:  Endoscopy;;   ESOPHAGEAL BANDING  09/20/2019   Procedure: ESOPHAGEAL BANDING;  Surgeon: Wilford Corner, MD;  [facility];  Service: Endoscopy;;   ESOPHAGEAL BANDING N/A 12/21/2019   Procedure: ESOPHAGEAL BANDING;  Surgeon: Wilford Corner, MD;  [facility];  Service: Endoscopy;  Laterality: N/A;   ESOPHAGOGASTRODUODENOSCOPY N/A 03/22/2019   Procedure: ESOPHAGOGASTRODUODENOSCOPY (EGD);  Surgeon: Wilford Corner, MD;  [facility];  Service: Endoscopy;  Laterality: N/A;   ESOPHAGOGASTRODUODENOSCOPY N/A 03/23/2019   Procedure: ESOPHAGOGASTRODUODENOSCOPY (EGD);  Surgeon: Wilford Corner, MD;  [facility];  Service: Endoscopy;  Laterality: N/A;   ESOPHAGOGASTRODUODENOSCOPY (EGD) WITH PROPOFOL N/A 07/23/2019   Procedure: ESOPHAGOGASTRODUODENOSCOPY (EGD) WITH PROPOFOL with possible banding;  Surgeon: Wilford Corner, MD;  [facility];  Service: Endoscopy;  Laterality: N/A;   ESOPHAGOGASTRODUODENOSCOPY (EGD) WITH PROPOFOL N/A 09/20/2019   Procedure: ESOPHAGOGASTRODUODENOSCOPY (EGD) WITH PROPOFOL;  Surgeon: Wilford Corner, MD;  [facility];  Service: Endoscopy;  Laterality: N/A;   ESOPHAGOGASTRODUODENOSCOPY (EGD) WITH PROPOFOL N/A 12/21/2019   Procedure: ESOPHAGOGASTRODUODENOSCOPY (EGD) WITH PROPOFOL with possible banding;  Surgeon: Wilford Corner, MD;  [facility];  Service: Endoscopy;  Laterality: N/A;   HERNIA REPAIR     TUBAL LIGATION  07/25/2000   VENTRAL HERNIA REPAIR N/A 07/06/2020   Procedure: VENTRAL HERNIA REPAIR WITH MESH;  Surgeon: Coralie Keens, MD;  Location: [place];  Service: General;  Laterality: N/A;   The following portions of the patient's history were reviewed and updated as appropriate: allergies, current medications, past family history, past medical history, past social  history, past surgical history and problem list.   Health Maintenance:  Normal pap and negative HRHPV on 08/2019.   Normal mammogram on 08/10/2021.   Review of Systems:  Pertinent items noted in HPI and remainder of comprehensive ROS otherwise negative.  Physical Exam:   General:  Alert, oriented and cooperative. Patient appears to be in no acute distress.  Mental Status: Normal mood and affect. Normal behavior. Normal judgment and thought content.   Respiratory: Normal respiratory effort, no problems with respiration noted  Rest of physical exam deferred due to type of encounter  Labs and Imaging FINAL MICROSCOPIC DIAGNOSIS:   A. ENDOMETRIUM, CURETTAGE:  -  Weakly proliferative endometrium.  -  Negative for hyperplasia, intraepithelial neoplasia (EIN) and  malignancy.    Assessment and Plan:     1. Postop check Doing well  2. Menorrhagia with irregular cycle Improved--no further f/u.       I discussed the assessment and treatment plan with the patient. The patient was provided an opportunity to ask questions and all were answered. The patient agreed with the plan and demonstrated an understanding of the instructions.   The patient was advised to call back or seek an in-person evaluation/go to the ED if the symptoms worsen or if the condition fails to improve as anticipated.  I provided 11 minutes of face-to-face time during this encounter.   [name], MD [facility], [place]

## 2021-12-13 ENCOUNTER — Other Ambulatory Visit: Payer: Self-pay | Admitting: Nurse Practitioner

## 2021-12-13 ENCOUNTER — Encounter: Payer: Self-pay | Admitting: Nurse Practitioner

## 2021-12-13 DIAGNOSIS — E119 Type 2 diabetes mellitus without complications: Secondary | ICD-10-CM

## 2021-12-13 MED ORDER — DAPAGLIFLOZIN PROPANEDIOL 5 MG PO TABS
5.0000 mg | ORAL_TABLET | Freq: Every day | ORAL | 1 refills | Status: DC
  Filled 2021-12-13: qty 30, 30d supply, fill #0
  Filled 2022-01-07: qty 30, 30d supply, fill #1
  Filled 2022-02-05: qty 30, 30d supply, fill #2
  Filled 2022-04-03: qty 30, 30d supply, fill #3
  Filled 2022-04-03: qty 30, 30d supply, fill #0
  Filled 2022-04-28: qty 30, 30d supply, fill #1
  Filled 2022-05-28: qty 30, 30d supply, fill #2

## 2021-12-13 MED ORDER — PANTOPRAZOLE SODIUM 40 MG PO TBEC
40.0000 mg | DELAYED_RELEASE_TABLET | Freq: Every day | ORAL | 1 refills | Status: DC
  Filled 2021-12-13: qty 30, 30d supply, fill #0

## 2021-12-14 ENCOUNTER — Other Ambulatory Visit: Payer: Self-pay

## 2021-12-21 ENCOUNTER — Other Ambulatory Visit: Payer: Self-pay | Admitting: Nurse Practitioner

## 2021-12-21 DIAGNOSIS — E118 Type 2 diabetes mellitus with unspecified complications: Secondary | ICD-10-CM

## 2021-12-21 DIAGNOSIS — K219 Gastro-esophageal reflux disease without esophagitis: Secondary | ICD-10-CM

## 2022-01-07 ENCOUNTER — Other Ambulatory Visit (HOSPITAL_COMMUNITY): Payer: Self-pay

## 2022-01-09 ENCOUNTER — Encounter: Payer: Self-pay | Admitting: Nurse Practitioner

## 2022-01-12 ENCOUNTER — Other Ambulatory Visit: Payer: Self-pay | Admitting: Nurse Practitioner

## 2022-01-14 NOTE — Telephone Encounter (Signed)
Requested Prescriptions  ?Pending Prescriptions Disp Refills  ?? albuterol (VENTOLIN HFA) 108 (90 Base) MCG/ACT inhaler [Pharmacy Med Name: ALBUTEROL HFA (PROAIR) INHALER] 8.5 each 1  ?  Sig: INHALE 2 PUFFS INTO THE LUNGS EVERY 6 HOURS AS NEEDED FOR COUGH  ?  ? Pulmonology:  Beta Agonists 2 Passed - 01/12/2022 11:37 AM  ?  ?  Passed - Last BP in normal range  ?  BP Readings from Last 1 Encounters:  ?12/05/21 130/85  ?   ?  ?  Passed - Last Heart Rate in normal range  ?  Pulse Readings from Last 1 Encounters:  ?12/05/21 81  ?   ?  ?  Passed - Valid encounter within last 12 months  ?  Recent Outpatient Visits   ?      ? 1 month ago Type 2 diabetes mellitus without complication, without long-term current use of insulin ([place])  ? [place], [place] W, NP  ? 4 months ago Type 2 diabetes mellitus without complication, without long-term current use of insulin ([place])  ? [place], [place] W, NP  ? 6 months ago Type 2 diabetes mellitus without complication, without long-term current use of insulin ([place])  ? [place], [place] W, NP  ? 11 months ago Type 2 diabetes mellitus without complication, without long-term current use of insulin ([place])  ? [place], [place] W, NP  ? 1 year ago Diabetes mellitus type 2, uncontrolled, with complications ([place])  ? [place] Gildardo Pounds, NP  ?  ?  ?Future Appointments   ?        ? In 4 months Gildardo Pounds, NP [place]  ?  ? ?  ?  ?  ? ? ?

## 2022-01-17 ENCOUNTER — Encounter: Payer: Self-pay | Admitting: Nurse Practitioner

## 2022-01-18 ENCOUNTER — Other Ambulatory Visit: Payer: Self-pay | Admitting: Nurse Practitioner

## 2022-01-18 ENCOUNTER — Other Ambulatory Visit: Payer: Self-pay

## 2022-01-18 MED ORDER — SULFAMETHOXAZOLE-TRIMETHOPRIM 800-160 MG PO TABS
1.0000 | ORAL_TABLET | Freq: Two times a day (BID) | ORAL | 0 refills | Status: AC
  Filled 2022-01-18: qty 14, 7d supply, fill #0

## 2022-01-29 ENCOUNTER — Ambulatory Visit (HOSPITAL_BASED_OUTPATIENT_CLINIC_OR_DEPARTMENT_OTHER): Payer: 59 | Admitting: Nurse Practitioner

## 2022-01-29 ENCOUNTER — Encounter: Payer: Self-pay | Admitting: Nurse Practitioner

## 2022-01-29 DIAGNOSIS — R053 Chronic cough: Secondary | ICD-10-CM

## 2022-01-29 MED ORDER — AZELASTINE-FLUTICASONE 137-50 MCG/ACT NA SUSP: 1.0000 | Freq: Two times a day (BID) | NASAL | 0 refills | Status: DC

## 2022-01-29 MED ORDER — LORATADINE 10 MG PO TABS: 10.0000 mg | ORAL_TABLET | Freq: Every day | ORAL | 3 refills | Status: DC

## 2022-01-29 NOTE — Progress Notes (Signed)
Virtual Visit via Telephone Note ?Due to national recommendations of social distancing due to [place] 19, telehealth visit is felt to be most appropriate for this patient at this time.  I discussed the limitations, risks, security and privacy concerns of performing an evaluation and management service by telephone and the availability of in person appointments. I also discussed with the patient that there may be a patient responsible charge related to this service. The patient expressed understanding and agreed to proceed.  ? ? ?I connected with Lolita Lenz on 01/29/22  at   1:50 PM EDT  EDT by telephone and verified that I am speaking with the correct person using two identifiers. ? ?Location of Patient: ?Private Residence ?  ?Location of Provider: ?Scientist, research (physical sciences) and [employer] Office  ?  ?Persons participating in Telemedicine visit: ?[name] FNP-BC ?Lolita Lenz  ?  ?History of Present Illness: ?Telemedicine visit for: COUGH ? ?Patient complains of nonproductive cough.  Symptoms began several months ago.  The cough is non-productive, without wheezing, dyspnea or hemoptysis, chest is painful during coughing, harsh, waxing and waning over time and is aggravated by  unknown factors  Associated symptoms include: NONE . Patient does not have new pets. Patient does not have a history of asthma. Patient does not have a kownhistory of environmental allergens. Patient has not had recent travel. Patient does not have a history of smoking. Patient  had previous Chest X-ray which was normal. Previous treatments include: Coricidin HBP, alka seltzer, tessalon albuterol inhaler and pantoprazole. ? ? ? ?Past Medical History:  ?Diagnosis Date  ? Anemia Dx January 03 2015  ? Diabetes mellitus without complication ([place])   ? Elevated liver enzymes   ? Essential hypertension Dx Apr 2016  ? Fatty liver   ? H/O: upper GI bleed 03/2019  ?  variceal bleed   ? Headache   ? History of blood transfusion  03/2019  ? Liver cirrhosis secondary to NASH [facility])   ? Pneumonia 03/2019  ? Reflux   ? Thrombocytopenia ([place])   ?  ?Past Surgical History:  ?Procedure Laterality Date  ? DILITATION & CURRETTAGE/HYSTROSCOPY WITH HYDROTHERMAL ABLATION N/A 11/20/2021  ? Procedure: DILATATION & CURETTAGE/HYSTEROSCOPY WITH HYDROTHERMAL ABLATION;  Surgeon: [name], MD;  Location: [place];  Service: Gynecology;  Laterality: N/A;  rep will be here confirmed on 11/12/21 CS  ? ESOPHAGEAL BANDING  03/22/2019  ? Procedure: ESOPHAGEAL BANDING;  Surgeon: Wilford Corner, MD;  [facility];  Service: Endoscopy;;  ? ESOPHAGEAL BANDING  03/23/2019  ? Procedure: ESOPHAGEAL BANDING;  Surgeon: Wilford Corner, MD;  [facility];  Service: Endoscopy;;  ? ESOPHAGEAL BANDING  09/20/2019  ? Procedure: ESOPHAGEAL BANDING;  Surgeon: Wilford Corner, MD;  [facility];  Service: Endoscopy;;  ? ESOPHAGEAL BANDING N/A 12/21/2019  ? Procedure: ESOPHAGEAL BANDING;  Surgeon: Wilford Corner, MD;  [facility];  Service: Endoscopy;  Laterality: N/A;  ? ESOPHAGOGASTRODUODENOSCOPY N/A 03/22/2019  ? Procedure: ESOPHAGOGASTRODUODENOSCOPY (EGD);  Surgeon: Wilford Corner, MD;  [facility];  Service: Endoscopy;  Laterality: N/A;  ? ESOPHAGOGASTRODUODENOSCOPY N/A 03/23/2019  ? Procedure: ESOPHAGOGASTRODUODENOSCOPY (EGD);  Surgeon: Wilford Corner, MD;  Location[facility];  Service: Endoscopy;  Laterality: N/A;  ? ESOPHAGOGASTRODUODENOSCOPY (EGD) WITH PROPOFOL N/A 07/23/2019  ? Procedure: ESOPHAGOGASTRODUODENOSCOPY (EGD) WITH PROPOFOL with possible banding;  Surgeon: Wilford Corner, MD;  [facility];  Service: Endoscopy;  Laterality: N/A;  ? ESOPHAGOGASTRODUODENOSCOPY (EGD) WITH PROPOFOL N/A 09/20/2019  ? Procedure: ESOPHAGOGASTRODUODENOSCOPY (EGD) WITH PROPOFOL;  Surgeon:  Wilford Corner, MD;  Location: [facility];  Service: Endoscopy;  Laterality: N/A;  ? ESOPHAGOGASTRODUODENOSCOPY  (EGD) WITH PROPOFOL N/A 12/21/2019  ? Procedure: ESOPHAGOGASTRODUODENOSCOPY (EGD) WITH PROPOFOL with possible banding;  Surgeon: Wilford Corner, MD;  [facility];  Service: Endoscopy;  Laterality: N/A;  ? HERNIA REPAIR    ? TUBAL LIGATION  07/25/2000  ? VENTRAL HERNIA REPAIR N/A 07/06/2020  ? Procedure: VENTRAL HERNIA REPAIR WITH MESH;  Surgeon: Coralie Keens, MD;  Location: [place];  Service: General;  Laterality: N/A;  ?  ?Family History  ?Problem Relation Age of Onset  ? Diabetes Father   ? COPD Mother   ? Esophageal cancer Mother   ? Diabetes Mother   ? Cancer Mother   ?     esophageal  ? Hypertension Mother   ? Breast cancer Maternal Aunt   ?  ?Social History  ? ?Socioeconomic History  ? Marital status: Married  ?  Spouse name: Not on file  ? Number of children: Not on file  ? Years of education: Not on file  ? Highest education level: Not on file  ?Occupational History  ?  Comment: security guard at [employer]  ?Tobacco Use  ? Smoking status: Never  ? Smokeless tobacco: Never  ?Vaping Use  ? Vaping Use: Never used  ?Substance and Sexual Activity  ? Alcohol use: Yes  ?  Comment: rarely-last alcohol drank in 08/2018  ? Drug use: No  ? Sexual activity: Yes  ?Other Topics Concern  ? Not on file  ?Social History Narrative  ? Not on file  ? ?Social Determinants of Health  ? ?Financial Resource Strain: Not on file  ?Food Insecurity: No Food Insecurity  ? Worried About Charity fundraiser in the Last Year: Never true  ? Ran Out of Food in the Last Year: Never true  ?Transportation Needs: No Transportation Needs  ? Lack of Transportation (Medical): No  ? Lack of Transportation (Non-Medical): No  ?Physical Activity: Not on file  ?Stress: Not on file  ?Social Connections: Not on file  ?  ? ?Observations/Objective: ?Awake, alert and oriented x 3 ? ? ?Review of Systems  ?Constitutional:  Negative for fever, malaise/fatigue and weight loss.  ?HENT: Negative.  Negative for nosebleeds.   ?Eyes:  Negative.  Negative for blurred vision, double vision and photophobia.  ?Respiratory:  Positive for cough. Negative for shortness of breath.   ?Cardiovascular: Negative.  Negative for chest pain, palpitations and leg swelling.  ?Gastrointestinal: Negative.  Negative for heartburn, nausea and vomiting.  ?Musculoskeletal: Negative.  Negative for myalgias.  ?Neurological: Negative.  Negative for dizziness, focal weakness, seizures and headaches.  ?Psychiatric/Behavioral: Negative.  Negative for suicidal ideas.    ?Assessment and Plan: ?Diagnoses and all orders for this visit: ? ?Chronic cough ?-     loratadine (CLARITIN) 10 MG tablet; Take 1 tablet (10 mg total) by mouth daily. ?-     Azelastine-Fluticasone 137-50 MCG/ACT SUSP; Place 1 spray into the nose every 12 (twelve) hours. ?Currently taking PPI. Will treat for allergies at this time. If no improvement in cough will refer to pulmonology ?  ? ?Follow Up Instructions ?Return in about 3 weeks (around 02/19/2022).  ? ?  ?I discussed the assessment and treatment plan with the patient. The patient was provided an opportunity to ask questions and all were answered. The patient agreed with the plan and demonstrated an understanding of the instructions. ?  ?The patient was advised to call back or  seek an in-person evaluation if the symptoms worsen or if the condition fails to improve as anticipated. ? ?I provided 14 minutes of non-face-to-face time during this encounter including median intraservice time, reviewing previous notes, labs, imaging, medications and explaining diagnosis and management. ? ?Gildardo Pounds, FNP-BC  ?

## 2022-01-29 NOTE — Patient Instructions (Signed)
Appointment is in your [place]. Talk to you soon. ?

## 2022-02-05 ENCOUNTER — Other Ambulatory Visit (HOSPITAL_COMMUNITY): Payer: Self-pay

## 2022-02-06 ENCOUNTER — Other Ambulatory Visit: Payer: Self-pay | Admitting: Nurse Practitioner

## 2022-02-06 DIAGNOSIS — R519 Headache, unspecified: Secondary | ICD-10-CM

## 2022-02-19 ENCOUNTER — Ambulatory Visit (HOSPITAL_BASED_OUTPATIENT_CLINIC_OR_DEPARTMENT_OTHER): Payer: 59 | Admitting: Nurse Practitioner

## 2022-02-19 ENCOUNTER — Encounter: Payer: Self-pay | Admitting: Nurse Practitioner

## 2022-02-19 DIAGNOSIS — Z6841 Body Mass Index (BMI) 40.0 and over, adult: Secondary | ICD-10-CM | POA: Diagnosis not present

## 2022-02-19 DIAGNOSIS — R053 Chronic cough: Secondary | ICD-10-CM | POA: Diagnosis not present

## 2022-02-19 NOTE — Progress Notes (Signed)
Virtual Visit via Telephone Note ? I discussed the limitations, risks, security and privacy concerns of performing an evaluation and management service by telephone and the availability of in person appointments. I also discussed with the patient that there may be a patient responsible charge related to this service. The patient expressed understanding and agreed to proceed.  ? ? ?I connected with Lolita Lenz on 02/19/22  at   1:50 PM EDT  EDT by telephone and verified that I am speaking with the correct person using two identifiers. ? ?Location of Patient: ?Private Residence ?  ?Location of Provider: ?Scientist, research (physical sciences) and [employer] Office  ?  ?Persons participating in Telemedicine visit: ?[name] FNP-BC ?Lolita Lenz  ?  ?History of Present Illness: ?Telemedicine visit for: Chronic cough ?  ?Patient complains of chronic nonproductive cough.  Symptoms began several months ago.  The cough is non-productive, without wheezing, dyspnea or hemoptysis, chest is painful during coughing, harsh, waxing and waning over time and is aggravated by  unknown factors  Associated symptoms include: weather changes, change in temperature, exertion . Patient does not have new pets. Patient does not have a history of asthma. Patient does not have a known history of environmental allergens although she is currently taking azelastine nasal spray and loratadine.  Patient has not had recent travel. Patient does not have a history of smoking. Patient  had previous Chest X-ray which was normal. Previous treatments include: Coricidin HBP, alka seltzer, tessalon albuterol inhaler and pantoprazole. ? ?Morbid Obesity ?She is inquiring  about bariatric surgery. Would like to know if she is a possible candidate. Based on BMI >40, DM, HPL and HTN she would be a candidate for surgery. Exercise is limited due to chronic low back pain ? ? ?Past Medical History:  ?Diagnosis Date  ? Anemia Dx January 03 2015  ? Diabetes  mellitus without complication ([place])   ? Elevated liver enzymes   ? Essential hypertension Dx Apr 2016  ? Fatty liver   ? H/O: upper GI bleed 03/2019  ?  variceal bleed   ? Headache   ? History of blood transfusion 03/2019  ? Liver cirrhosis secondary to NASH [facility])   ? Pneumonia 03/2019  ? Reflux   ? Thrombocytopenia ([place])   ?  ?Past Surgical History:  ?Procedure Laterality Date  ? DILITATION & CURRETTAGE/HYSTROSCOPY WITH HYDROTHERMAL ABLATION N/A 11/20/2021  ? Procedure: DILATATION & CURETTAGE/HYSTEROSCOPY WITH HYDROTHERMAL ABLATION;  Surgeon: [name], MD;  Location: [facility] [place];  Service: Gynecology;  Laterality: N/A;  rep will be here confirmed on 11/12/21 CS  ? ESOPHAGEAL BANDING  03/22/2019  ? Procedure: ESOPHAGEAL BANDING;  Surgeon: Wilford Corner, MD;  [facility];  Service: Endoscopy;;  ? ESOPHAGEAL BANDING  03/23/2019  ? Procedure: ESOPHAGEAL BANDING;  Surgeon: Wilford Corner, MD;  [facility];  Service: Endoscopy;;  ? ESOPHAGEAL BANDING  09/20/2019  ? Procedure: ESOPHAGEAL BANDING;  Surgeon: Wilford Corner, MD;  [facility];  Service: Endoscopy;;  ? ESOPHAGEAL BANDING N/A 12/21/2019  ? Procedure: ESOPHAGEAL BANDING;  Surgeon: Wilford Corner, MD;  [facility];  Service: Endoscopy;  Laterality: N/A;  ? ESOPHAGOGASTRODUODENOSCOPY N/A 03/22/2019  ? Procedure: ESOPHAGOGASTRODUODENOSCOPY (EGD);  Surgeon: Wilford Corner, MD;  [facility];  Service: Endoscopy;  Laterality: N/A;  ? ESOPHAGOGASTRODUODENOSCOPY N/A 03/23/2019  ? Procedure: ESOPHAGOGASTRODUODENOSCOPY (EGD);  Surgeon: Wilford Corner, MD;  [facility];  Service: Endoscopy;  Laterality: N/A;  ? ESOPHAGOGASTRODUODENOSCOPY (EGD) WITH PROPOFOL N/A 07/23/2019  ? Procedure: ESOPHAGOGASTRODUODENOSCOPY (  EGD) WITH PROPOFOL with possible banding;  Surgeon: Wilford Corner, MD;  [facility];  Service: Endoscopy;  Laterality: N/A;  ? ESOPHAGOGASTRODUODENOSCOPY (EGD) WITH  PROPOFOL N/A 09/20/2019  ? Procedure: ESOPHAGOGASTRODUODENOSCOPY (EGD) WITH PROPOFOL;  Surgeon: Wilford Corner, MD;  [facility];  Service: Endoscopy;  Laterality: N/A;  ? ESOPHAGOGASTRODUODENOSCOPY (EGD) WITH PROPOFOL N/A 12/21/2019  ? Procedure: ESOPHAGOGASTRODUODENOSCOPY (EGD) WITH PROPOFOL with possible banding;  Surgeon: Wilford Corner, MD;  [facility];  Service: Endoscopy;  Laterality: N/A;  ? HERNIA REPAIR    ? TUBAL LIGATION  07/25/2000  ? VENTRAL HERNIA REPAIR N/A 07/06/2020  ? Procedure: VENTRAL HERNIA REPAIR WITH MESH;  Surgeon: Coralie Keens, MD;  Location: [facility];  Service: General;  Laterality: N/A;  ?  ?Family History  ?Problem Relation Age of Onset  ? Diabetes Father   ? COPD Mother   ? Esophageal cancer Mother   ? Diabetes Mother   ? Cancer Mother   ?     esophageal  ? Hypertension Mother   ? Breast cancer Maternal Aunt   ?  ?Social History  ? ?Socioeconomic History  ? Marital status: Married  ?  Spouse name: Not on file  ? Number of children: Not on file  ? Years of education: Not on file  ? Highest education level: Not on file  ?Occupational History  ?  Comment: security guard at [employer]  ?Tobacco Use  ? Smoking status: Never  ? Smokeless tobacco: Never  ?Vaping Use  ? Vaping Use: Never used  ?Substance and Sexual Activity  ? Alcohol use: Yes  ?  Comment: rarely-last alcohol drank in 08/2018  ? Drug use: No  ? Sexual activity: Yes  ?Other Topics Concern  ? Not on file  ?Social History Narrative  ? Not on file  ? ?Social Determinants of Health  ? ?Financial Resource Strain: Not on file  ?Food Insecurity: No Food Insecurity  ? Worried About Charity fundraiser in the Last Year: Never true  ? Ran Out of Food in the Last Year: Never true  ?Transportation Needs: No Transportation Needs  ? Lack of Transportation (Medical): No  ? Lack of Transportation (Non-Medical): No  ?Physical Activity: Not on file  ?Stress: Not on file  ?Social Connections: Not on file  ?   ? ?Observations/Objective: ?Awake, alert and oriented x 3 ? ? ?Review of Systems  ?Constitutional:  Negative for fever, malaise/fatigue and weight loss.  ?HENT: Negative.  Negative for nosebleeds.   ?Eyes: Negative.  Negative for blurred vision, double vision and photophobia.  ?Respiratory:  Positive for cough. Negative for shortness of breath.   ?Cardiovascular: Negative.  Negative for chest pain, palpitations and leg swelling.  ?Gastrointestinal: Negative.  Negative for heartburn, nausea and vomiting.  ?Musculoskeletal: Negative.  Negative for myalgias.  ?Neurological: Negative.  Negative for dizziness, focal weakness, seizures and headaches.  ?Psychiatric/Behavioral: Negative.  Negative for suicidal ideas.    ?Assessment and Plan: ?Diagnoses and all orders for this visit: ? ?Chronic cough ?-     Ambulatory referral to Pulmonology ? ?Morbid obesity with BMI of 40.0-44.9, adult ([place]) ?-     Amb Referral to Bariatric Surgery ? ?  ? ?Follow Up Instructions ?Return in about 4 months (around 06/21/2022).  ? ?  ?I discussed the assessment and treatment plan with the patient. The patient was provided an opportunity to ask questions and all were answered. The patient agreed with the plan and demonstrated an understanding of the instructions. ?  ?  The patient was advised to call back or seek an in-person evaluation if the symptoms worsen or if the condition fails to improve as anticipated. ? ?I provided 13 minutes of non-face-to-face time during this encounter including median intraservice time, reviewing previous notes, labs, imaging, medications and explaining diagnosis and management. ? ?Gildardo Pounds, FNP-BC  ?

## 2022-02-25 ENCOUNTER — Other Ambulatory Visit: Payer: Self-pay | Admitting: Nurse Practitioner

## 2022-02-25 DIAGNOSIS — R053 Chronic cough: Secondary | ICD-10-CM

## 2022-03-11 ENCOUNTER — Ambulatory Visit (INDEPENDENT_AMBULATORY_CARE_PROVIDER_SITE_OTHER): Payer: 59 | Admitting: Pulmonary Disease

## 2022-03-11 ENCOUNTER — Encounter: Payer: Self-pay | Admitting: Pulmonary Disease

## 2022-03-11 VITALS — BP 122/80 | HR 73 | Ht 64.0 in | Wt 248.0 lb

## 2022-03-11 DIAGNOSIS — R053 Chronic cough: Secondary | ICD-10-CM

## 2022-03-11 MED ORDER — FLUTICASONE FUROATE-VILANTEROL 100-25 MCG/ACT IN AEPB: 1.0000 | INHALATION_SPRAY | Freq: Every day | RESPIRATORY_TRACT | 3 refills | Status: DC

## 2022-03-11 NOTE — Patient Instructions (Addendum)
?  Prescription for an inhaler-Breo ?Use once a day-make sure you rinse your mouth following use ? ?Continue albuterol as needed ? ?Breathing study on the day of next visit ? ?We will see you in about 6 weeks ? ?Call if progressive symptoms or symptoms not continuing to improve ?

## 2022-03-11 NOTE — Progress Notes (Signed)
? ?      ?Angela Castillo    [id number]    1976/10/24 ? ?Primary Care Physician:Fleming, Vernia Buff, NP ? ?Referring Physician: Gildardo Pounds, NP ?[address] ?[address] ?[place],  [place] [address] ? ?Chief complaint:   ?Protracted cough ? ?HPI: ? ?Has had a cough for almost a year, on and off ? ?No underlying diagnosis of asthma or COPD ? ?Never smoker ? ?She does have spasms of coughing ?Denies any underlying history of allergies, coughing gets worse with weather changes, sometimes on exertion ? ?Has used albuterol with some benefit ? ?She does have underlying history of hypertension, diabetes, history of chronic headaches ? ?Symptomatically Coricidin does help ? ?She is not bringing up any significant secretions at present ?Cough seems a little bit better in the last few days ? ?Never smoked ?No significant secondhand smoking history ? ?Worked in [employer] and recently got ? ?No family history of asthma ? ?Outpatient Encounter Medications as of 03/11/2022  ?Medication Sig  ? acetaminophen (TYLENOL) 500 MG tablet Take 500-1,000 mg by mouth every 6 (six) hours as needed for mild pain.  ? albuterol (VENTOLIN HFA) 108 (90 Base) MCG/ACT inhaler INHALE 2 PUFFS INTO THE LUNGS EVERY 6 HOURS AS NEEDED FOR COUGH  ? amitriptyline (ELAVIL) 25 MG tablet TAKE 1 TABLET(25 MG) BY MOUTH AT BEDTIME  ? Azelastine-Fluticasone 137-50 MCG/ACT SUSP PLACE 1 SPRAY IN NOSTRIL(S) EVERY 12 HOURS  ? benzonatate (TESSALON) 200 MG capsule TAKE 1 CAPSULE BY MOUTH 2 TIMES DAILY AS NEEDED FOR COUGH  ? Biotin 10000 MCG TABS Take 10,000 mcg by mouth daily.  ? blood glucose meter kit and supplies KIT Dispense based on patient and insurance preference. Use up to four times daily as directed. (FOR ICD-9 250.00, 250.01). ?Dispense Relion  ? dapagliflozin propanediol (FARXIGA) 5 MG TABS tablet Take 1 tablet (5 mg total) by mouth daily before breakfast.  ? ferrous sulfate 325 (65 FE) MG tablet Take 325 mg by mouth in the morning  and at bedtime.  ? fluticasone furoate-vilanterol (BREO ELLIPTA) 100-25 MCG/ACT AEPB Inhale 1 puff into the lungs daily.  ? glucose blood (CONTOUR NEXT TEST) test strip USE UP TO FOUR TIMES DAILY AS DIRECTED  ? ketoconazole (NIZORAL) 2 % cream Apply 1 application topically daily as needed for irritation.  ? loratadine (CLARITIN) 10 MG tablet Take 1 tablet (10 mg total) by mouth daily.  ? losartan-hydrochlorothiazide (HYZAAR) 50-12.5 MG tablet TAKE 1 TABLET BY MOUTH EVERY DAY  ? Menthol, Topical Analgesic, (ICY HOT MEDICATED SPRAY) 16 % LIQD Apply 1 spray topically daily as needed (pain).  ? metFORMIN (GLUCOPHAGE) 500 MG tablet TAKE 1 TABLET BY MOUTH TWICE A DAY WITH MEALS  ? Microlet Lancets MISC USE UP TO FOUR TIMES DAILY AS DIRECTED  ? mupirocin ointment (BACTROBAN) 2 % APPLY TOPICALLY TWICE A DAY  ? ondansetron (ZOFRAN-ODT) 4 MG disintegrating tablet Take 4 mg by mouth every 8 (eight) hours as needed for vomiting or nausea.  ? oxyCODONE (OXY IR/ROXICODONE) 5 MG immediate release tablet Take 1 tablet (5 mg total) by mouth every 6 (six) hours as needed for severe pain.  ? pantoprazole (PROTONIX) 40 MG tablet TAKE 1 TABLET BY MOUTH EVERY DAY  ? propranolol (INDERAL) 20 MG tablet TAKE 1 TABLET BY MOUTH TWICE A DAY  ? vitamin C (ASCORBIC ACID) 500 MG tablet Take 500 mg by mouth 2 (two) times daily.  ? omega-3 acid ethyl esters (LOVAZA) 1 g capsule Take  1 capsule (1 g total) by mouth 2 (two) times daily.  ? ?No facility-administered encounter medications on file as of 03/11/2022.  ? ? ?Allergies as of 03/11/2022 - Review Complete 03/11/2022  ?Allergen Reaction Noted  ? Nsaids  06/20/2019  ? ? ?Past Medical History:  ?Diagnosis Date  ? Anemia Dx January 03 2015  ? Diabetes mellitus without complication ([place])   ? Elevated liver enzymes   ? Essential hypertension Dx Apr 2016  ? Fatty liver   ? H/O: upper GI bleed 03/2019  ?  variceal bleed   ? Headache   ? History of blood transfusion 03/2019  ? Liver cirrhosis secondary to  NASH [facility])   ? Pneumonia 03/2019  ? Reflux   ? Thrombocytopenia ([place])   ? ? ?Past Surgical History:  ?Procedure Laterality Date  ? DILITATION & CURRETTAGE/HYSTROSCOPY WITH HYDROTHERMAL ABLATION N/A 11/20/2021  ? Procedure: DILATATION & CURETTAGE/HYSTEROSCOPY WITH HYDROTHERMAL ABLATION;  Surgeon: [name], MD;  Location: [place];  Service: Gynecology;  Laterality: N/A;  rep will be here confirmed on 11/12/21 CS  ? ESOPHAGEAL BANDING  03/22/2019  ? Procedure: ESOPHAGEAL BANDING;  Surgeon: Wilford Corner, MD;  [facility];  Service: Endoscopy;;  ? ESOPHAGEAL BANDING  03/23/2019  ? Procedure: ESOPHAGEAL BANDING;  Surgeon: Wilford Corner, MD;  [facility];  Service: Endoscopy;;  ? ESOPHAGEAL BANDING  09/20/2019  ? Procedure: ESOPHAGEAL BANDING;  Surgeon: Wilford Corner, MD;  [facility];  Service: Endoscopy;;  ? ESOPHAGEAL BANDING N/A 12/21/2019  ? Procedure: ESOPHAGEAL BANDING;  Surgeon: Wilford Corner, MD;  [facility];  Service: Endoscopy;  Laterality: N/A;  ? ESOPHAGOGASTRODUODENOSCOPY N/A 03/22/2019  ? Procedure: ESOPHAGOGASTRODUODENOSCOPY (EGD);  Surgeon: Wilford Corner, MD;  [facility];  Service: Endoscopy;  Laterality: N/A;  ? ESOPHAGOGASTRODUODENOSCOPY N/A 03/23/2019  ? Procedure: ESOPHAGOGASTRODUODENOSCOPY (EGD);  Surgeon: Wilford Corner, MD;  [facility];  Service: Endoscopy;  Laterality: N/A;  ? ESOPHAGOGASTRODUODENOSCOPY (EGD) WITH PROPOFOL N/A 07/23/2019  ? Procedure: ESOPHAGOGASTRODUODENOSCOPY (EGD) WITH PROPOFOL with possible banding;  Surgeon: Wilford Corner, MD;  [facility];  Service: Endoscopy;  Laterality: N/A;  ? ESOPHAGOGASTRODUODENOSCOPY (EGD) WITH PROPOFOL N/A 09/20/2019  ? Procedure: ESOPHAGOGASTRODUODENOSCOPY (EGD) WITH PROPOFOL;  Surgeon: Wilford Corner, MD;  [facility];  Service: Endoscopy;  Laterality: N/A;  ? ESOPHAGOGASTRODUODENOSCOPY (EGD) WITH PROPOFOL N/A 12/21/2019  ?  Procedure: ESOPHAGOGASTRODUODENOSCOPY (EGD) WITH PROPOFOL with possible banding;  Surgeon: Wilford Corner, MD;  [facility];  Service: Endoscopy;  Laterality: N/A;  ? HERNIA REPAIR    ? TUBAL LIGATION  07/25/2000  ? VENTRAL HERNIA REPAIR N/A 07/06/2020  ? Procedure: VENTRAL HERNIA REPAIR WITH MESH;  Surgeon: Coralie Keens, MD;  Location: [place];  Service: General;  Laterality: N/A;  ? ? ?Family History  ?Problem Relation Age of Onset  ? Diabetes Father   ? COPD Mother   ? Esophageal cancer Mother   ? Diabetes Mother   ? Cancer Mother   ?     esophageal  ? Hypertension Mother   ? Breast cancer Maternal Aunt   ? ? ?Social History  ? ?Socioeconomic History  ? Marital status: Married  ?  Spouse name: Not on file  ? Number of children: Not on file  ? Years of education: Not on file  ? Highest education level: Not on file  ?Occupational History  ?  Comment: security guard at [employer]  ?Tobacco Use  ? Smoking status: Never  ? Smokeless tobacco: Never  ?Vaping Use  ?  Vaping Use: Never used  ?Substance and Sexual Activity  ? Alcohol use: Yes  ?  Comment: rarely-last alcohol drank in 08/2018  ? Drug use: No  ? Sexual activity: Yes  ?Other Topics Concern  ? Not on file  ?Social History Narrative  ? Not on file  ? ?Social Determinants of Health  ? ?Financial Resource Strain: Not on file  ?Food Insecurity: No Food Insecurity  ? Worried About Charity fundraiser in the Last Year: Never true  ? Ran Out of Food in the Last Year: Never true  ?Transportation Needs: No Transportation Needs  ? Lack of Transportation (Medical): No  ? Lack of Transportation (Non-Medical): No  ?Physical Activity: Not on file  ?Stress: Not on file  ?Social Connections: Not on file  ?Intimate Partner Violence: Not on file  ? ? ?Review of Systems  ?Respiratory:  Positive for cough and shortness of breath.   ? ?Vitals:  ? 03/11/22 1447  ?BP: 122/80  ?Pulse: 73  ?SpO2: 98%  ? ? ? ?Physical Exam ?Constitutional:   ?   Appearance: She  is obese.  ?HENT:  ?   Head: Normocephalic.  ?   Right Ear: Tympanic membrane normal.  ?   Nose: Nose normal.  ?   Mouth/Throat:  ?   Mouth: Mucous membranes are moist.  ?Cardiovascular:  ?   Rate and Rhyth

## 2022-03-25 ENCOUNTER — Ambulatory Visit (INDEPENDENT_AMBULATORY_CARE_PROVIDER_SITE_OTHER): Payer: 59 | Admitting: Podiatry

## 2022-03-25 ENCOUNTER — Ambulatory Visit (INDEPENDENT_AMBULATORY_CARE_PROVIDER_SITE_OTHER): Payer: 59

## 2022-03-25 DIAGNOSIS — M19079 Primary osteoarthritis, unspecified ankle and foot: Secondary | ICD-10-CM | POA: Diagnosis not present

## 2022-03-25 DIAGNOSIS — M79672 Pain in left foot: Secondary | ICD-10-CM

## 2022-03-25 DIAGNOSIS — L600 Ingrowing nail: Secondary | ICD-10-CM | POA: Diagnosis not present

## 2022-03-25 DIAGNOSIS — B351 Tinea unguium: Secondary | ICD-10-CM

## 2022-03-25 DIAGNOSIS — M19072 Primary osteoarthritis, left ankle and foot: Secondary | ICD-10-CM

## 2022-03-25 NOTE — Patient Instructions (Signed)

## 2022-03-27 NOTE — Progress Notes (Signed)
Subjective: 46 year old female presents the office today for concerns of a right hallux toenail, and thickened discolored causing discomfort.  She is previous had the nail removed before it is coming thick and discolored.  She was having nail removed again.  No swelling or redness or any drainage.  Also has secondary concerns of her left big toe she had some swelling and tenderness and she reports on the IPJ.  No injury currently but she had a previous history of injury.   Objective: AAO x3, NAD DP/PT pulses palpable bilaterally, CRT less than 3 seconds Right hallux nail significantly hypertrophic, dystrophic with brown discoloration.  Tenderness in the entire toenail with incurvation of the nail borders.  No edema, erythema or signs of infection.  No open lesions. On the left hallux there is mild edema present mild tenderness palpation of the IPJ.  There was actually crepitation with IPJ range of motion. No pain with calf compression, swelling, warmth, erythema  Assessment: 46 year old female with ingrown toenail, onychomycosis/onychodystrophy right hallux; left hallux IPJ arthritis  Plan: -All treatment options discussed with the patient including all alternatives, risks, complications.  -X-rays were obtained reviewed of the left foot.  Minimal arthritic changes present the hallux IPJ.  No evidence of acute fracture. -Regards to the left hallux discussed wearing stiffer soled shoes, icing.  She is previously had injuries to the toe but no recent injury. -At this point discussed with her total nail avulsion of the right hallux with chemical hysterectomy to help prevent regrowth and she wants to proceed with this at this time.  Consent was signed.  Skin was cleaned alcohol and mixture of 3 cc of lidocaine, Marcaine plain was infiltrated digital block fashion.  Once anesthetized the skin was prepped with Betadine.  Tourniquet applied.  The right hallux toenail was removed in total making sure remove  all nail borders.  No purulence.  Underlying nailbed intact.  Phenol was applied under sterile conditions and irrigated with alcohol.  Hemostasis achieved.  Silvadene was applied followed by dressing.  Tourniquet released and there is found to be an immediate cap refill time to the digit.  Post procedure instructions discussed.  Monitor for any signs or symptoms of infection.  Tolerated well. -Patient encouraged to call the office with any questions, concerns, change in symptoms.   Trula Slade DPM

## 2022-04-02 ENCOUNTER — Other Ambulatory Visit: Payer: Self-pay | Admitting: Podiatry

## 2022-04-02 ENCOUNTER — Other Ambulatory Visit: Payer: Self-pay | Admitting: Family Medicine

## 2022-04-02 DIAGNOSIS — M19079 Primary osteoarthritis, unspecified ankle and foot: Secondary | ICD-10-CM

## 2022-04-03 ENCOUNTER — Other Ambulatory Visit: Payer: Self-pay

## 2022-04-03 ENCOUNTER — Other Ambulatory Visit (HOSPITAL_COMMUNITY): Payer: Self-pay

## 2022-04-04 ENCOUNTER — Encounter: Payer: Self-pay | Admitting: Nurse Practitioner

## 2022-04-04 ENCOUNTER — Other Ambulatory Visit: Payer: Self-pay | Admitting: Nurse Practitioner

## 2022-04-04 DIAGNOSIS — M533 Sacrococcygeal disorders, not elsewhere classified: Secondary | ICD-10-CM

## 2022-04-05 ENCOUNTER — Ambulatory Visit (HOSPITAL_COMMUNITY)
Admission: RE | Admit: 2022-04-05 | Discharge: 2022-04-05 | Disposition: A | Discharge status: Home / Self Care | Payer: 59 | Source: Ambulatory Visit | Attending: Nurse Practitioner | Admitting: Nurse Practitioner

## 2022-04-05 DIAGNOSIS — M545 Low back pain, unspecified: Secondary | ICD-10-CM | POA: Insufficient documentation

## 2022-04-05 DIAGNOSIS — G8929 Other chronic pain: Secondary | ICD-10-CM | POA: Insufficient documentation

## 2022-04-05 DIAGNOSIS — M533 Sacrococcygeal disorders, not elsewhere classified: Secondary | ICD-10-CM | POA: Insufficient documentation

## 2022-04-05 IMAGING — CR DG LUMBAR SPINE COMPLETE 4+V
5 series · 5 of 5 positions shown · non-contrast
Comparison: X-ray lumbar spine [DATE]

CLINICAL DATA: chronic lumbar back pain

EXAM:
LUMBAR SPINE - COMPLETE 4+ VIEW

[t lumbar spine ap]
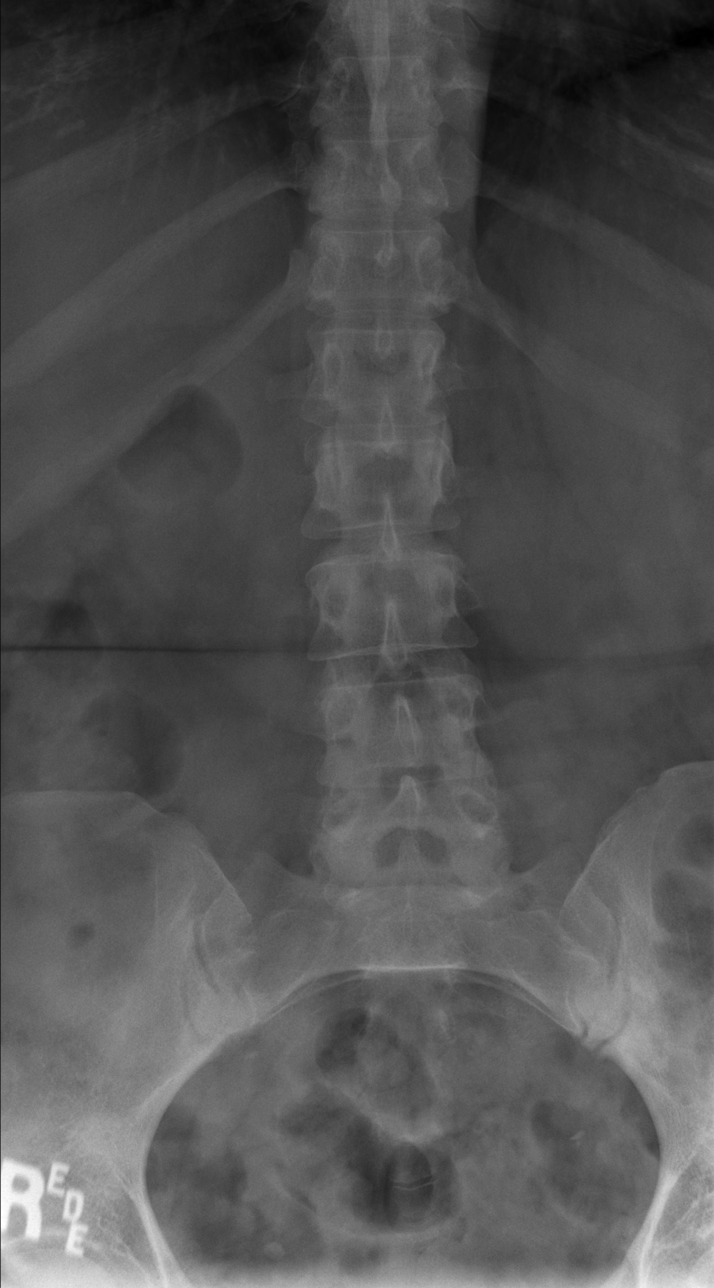

[t lumbar spine obl (1 of 2)]
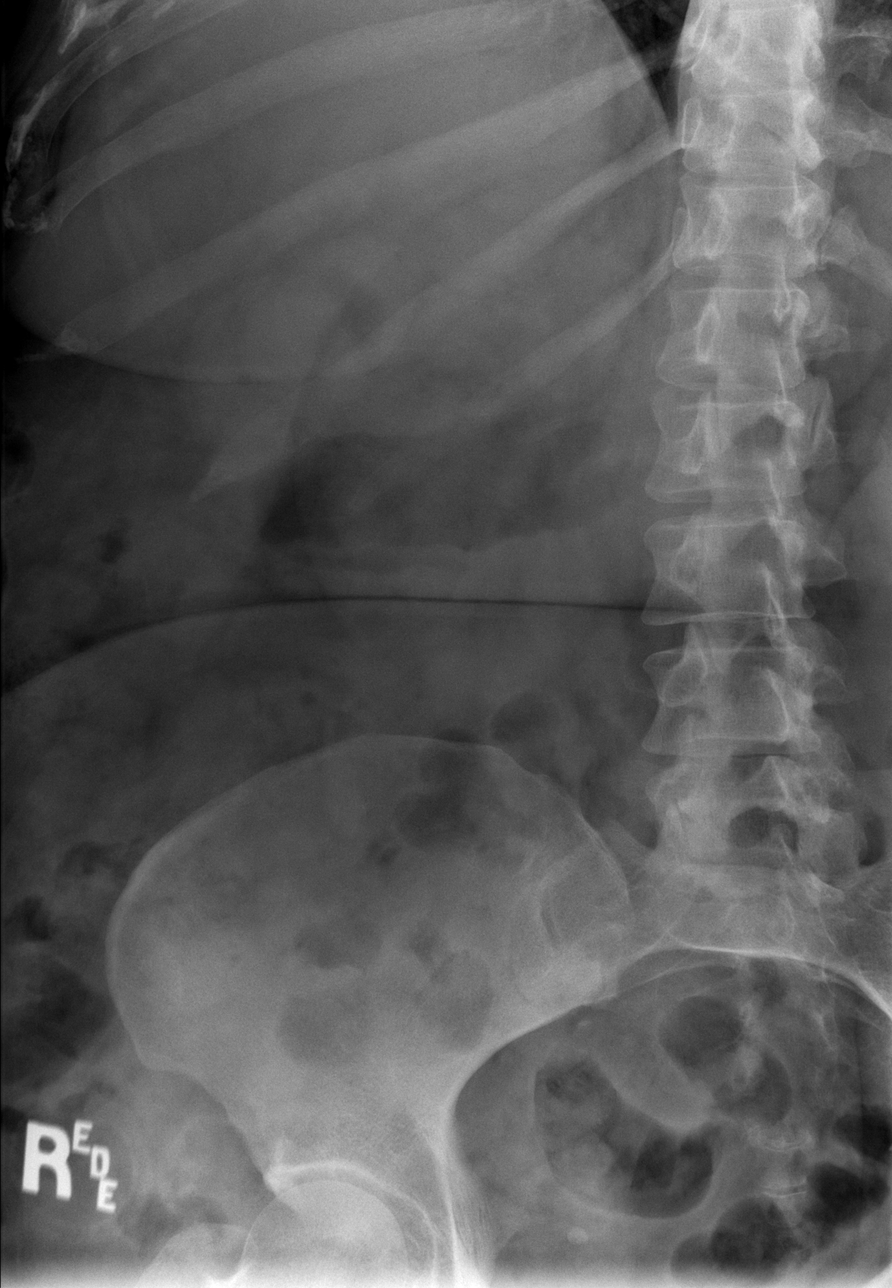

[t lumbar spine obl (2 of 2)]
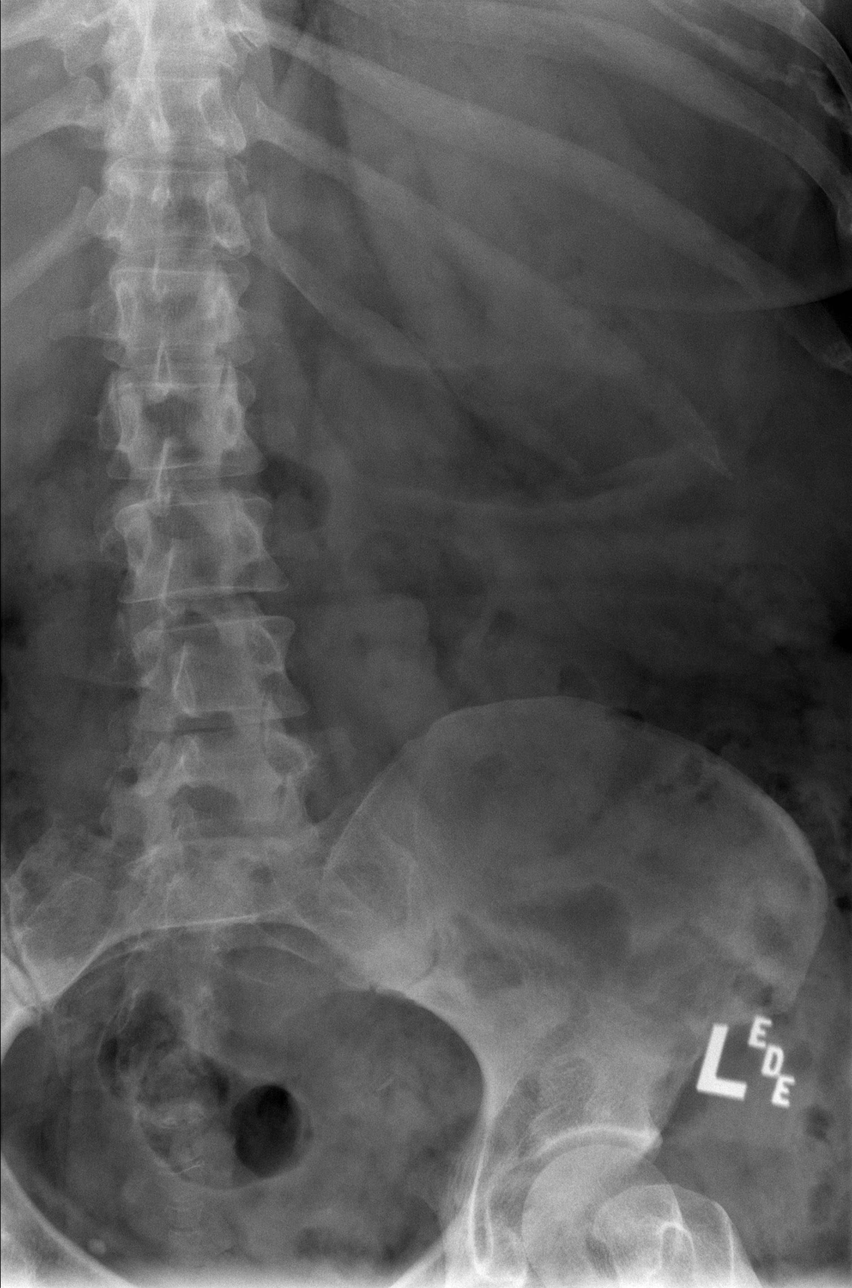

[t lumbar spine lat]
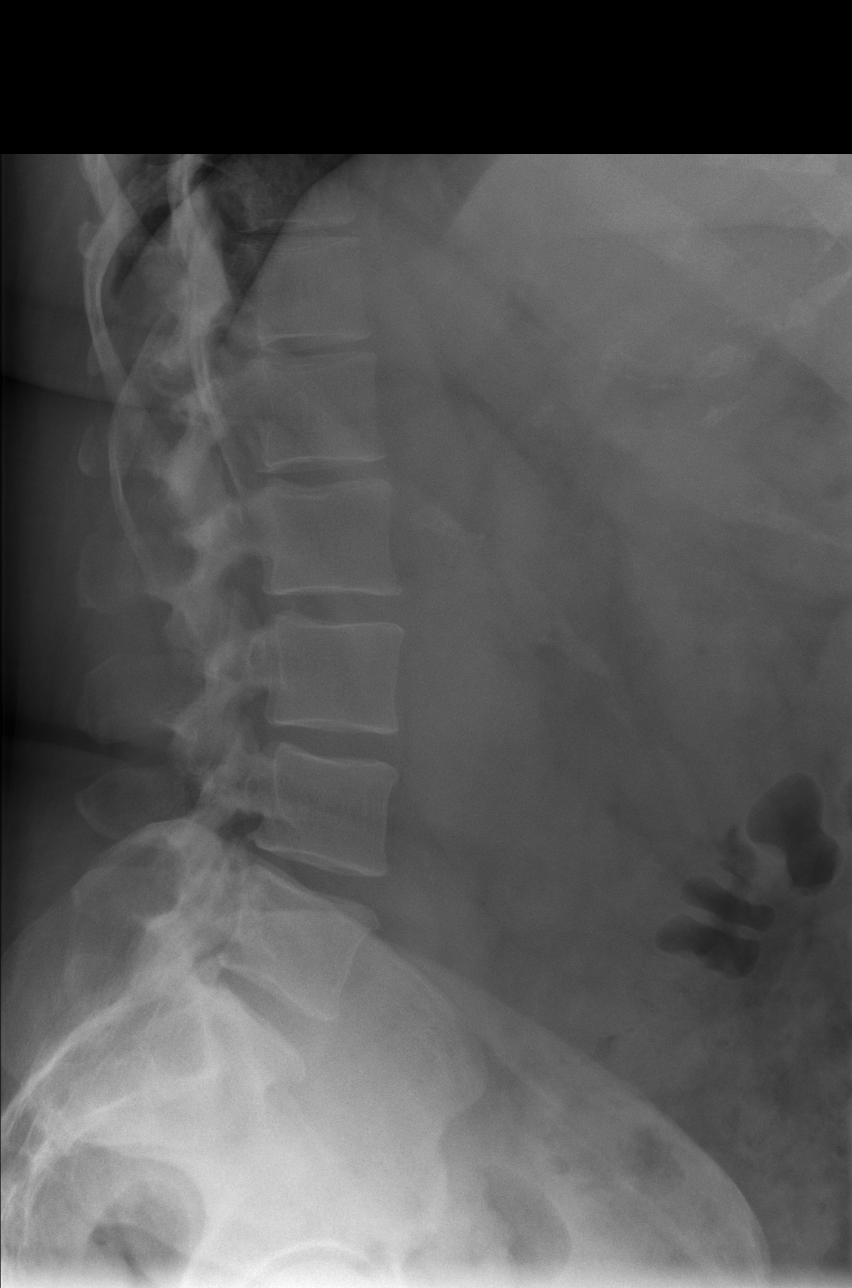

[t lumbar l-5 s-1 spot]
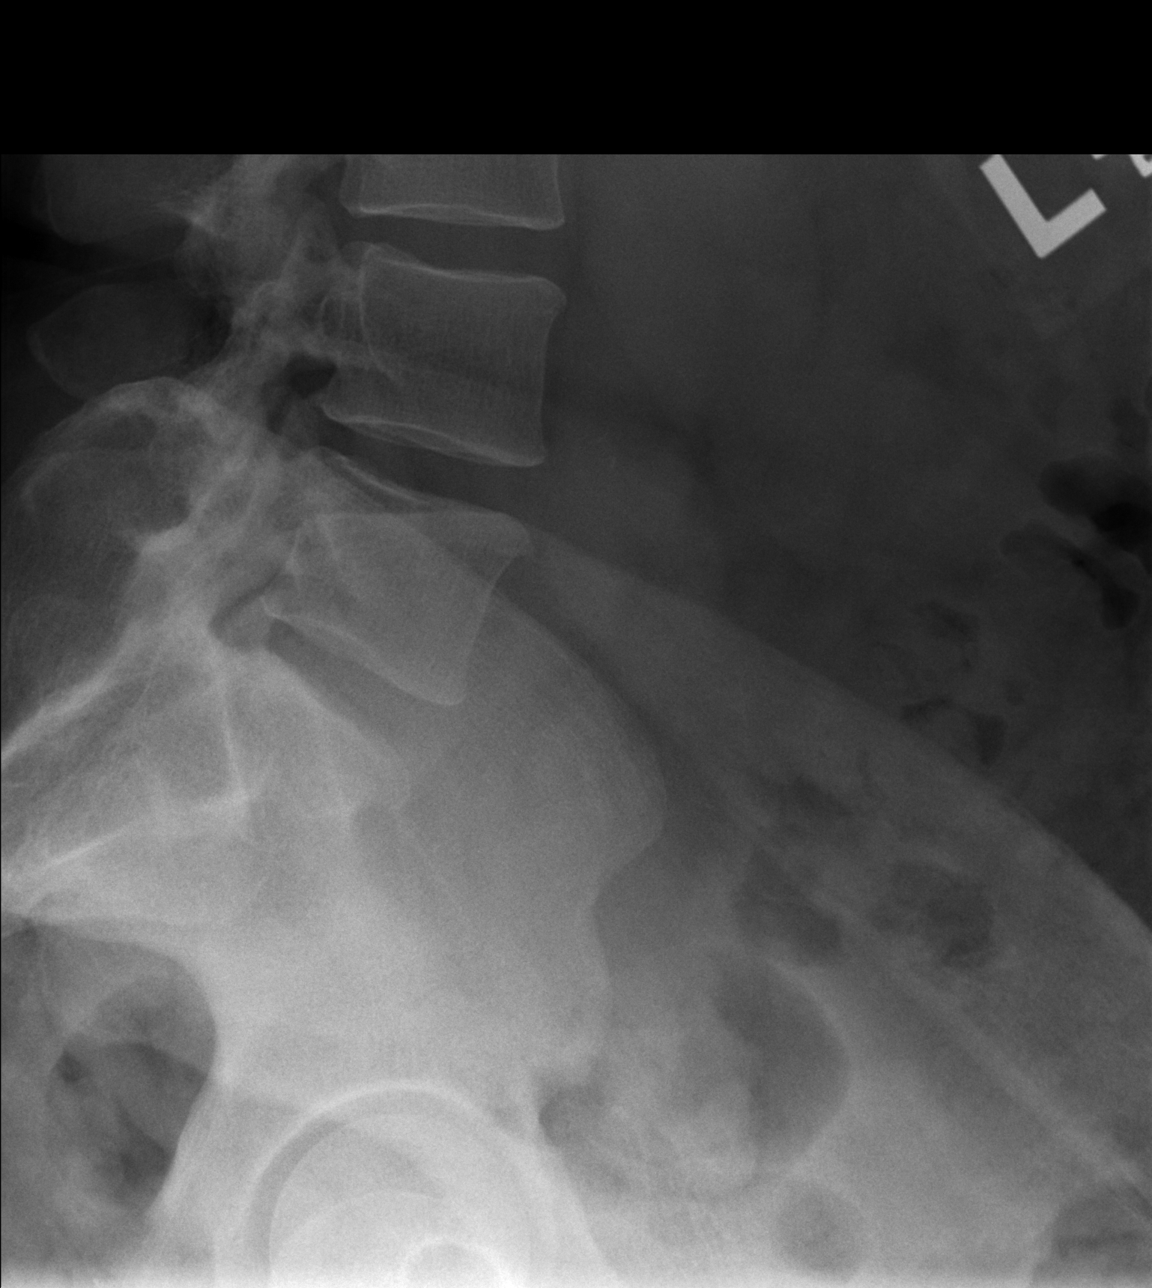

[5 of 5 positions shown; findings below may reference images not displayed]

FINDINGS: Five non-rib-bearing lumbar vertebral bodies. There is no evidence
of lumbar spine fracture. Alignment is normal. Intervertebral disc
spaces are maintained.
IMPRESSION: Negative.

## 2022-04-05 IMAGING — CR DG SACRUM/COCCYX 2+V
3 series · 3 of 3 positions shown · non-contrast
Comparison: None Available.

CLINICAL DATA: low back pain in tailbone

EXAM:
SACRUM AND COCCYX - 2+ VIEW

[t sacrum ap]
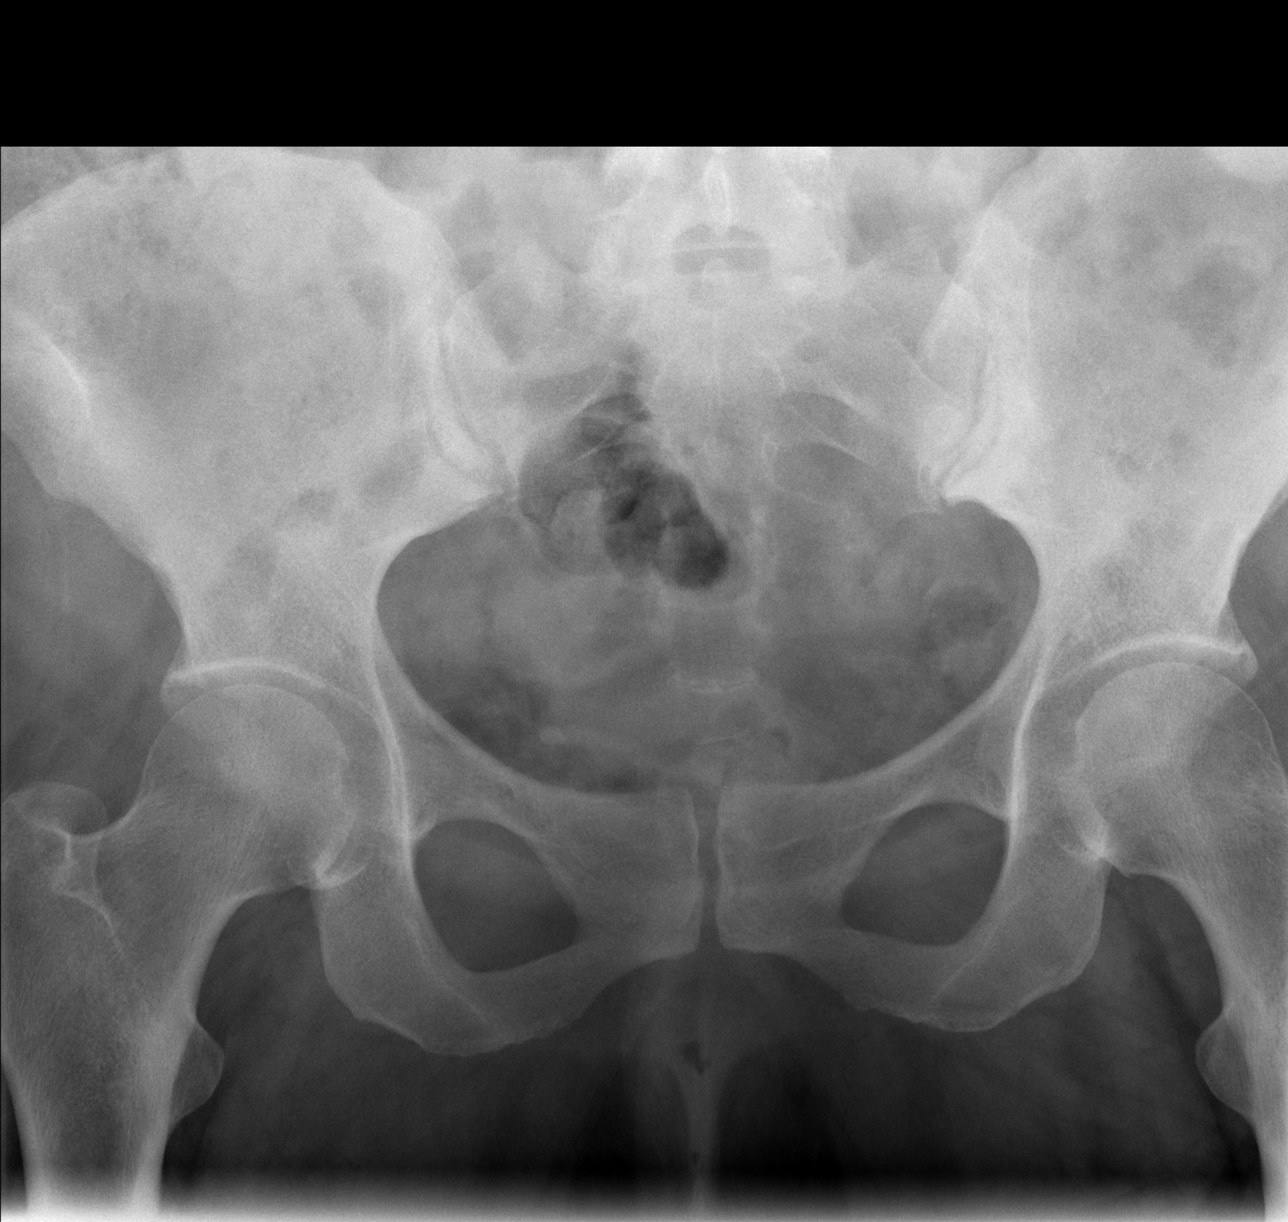

[t coccyx ap]
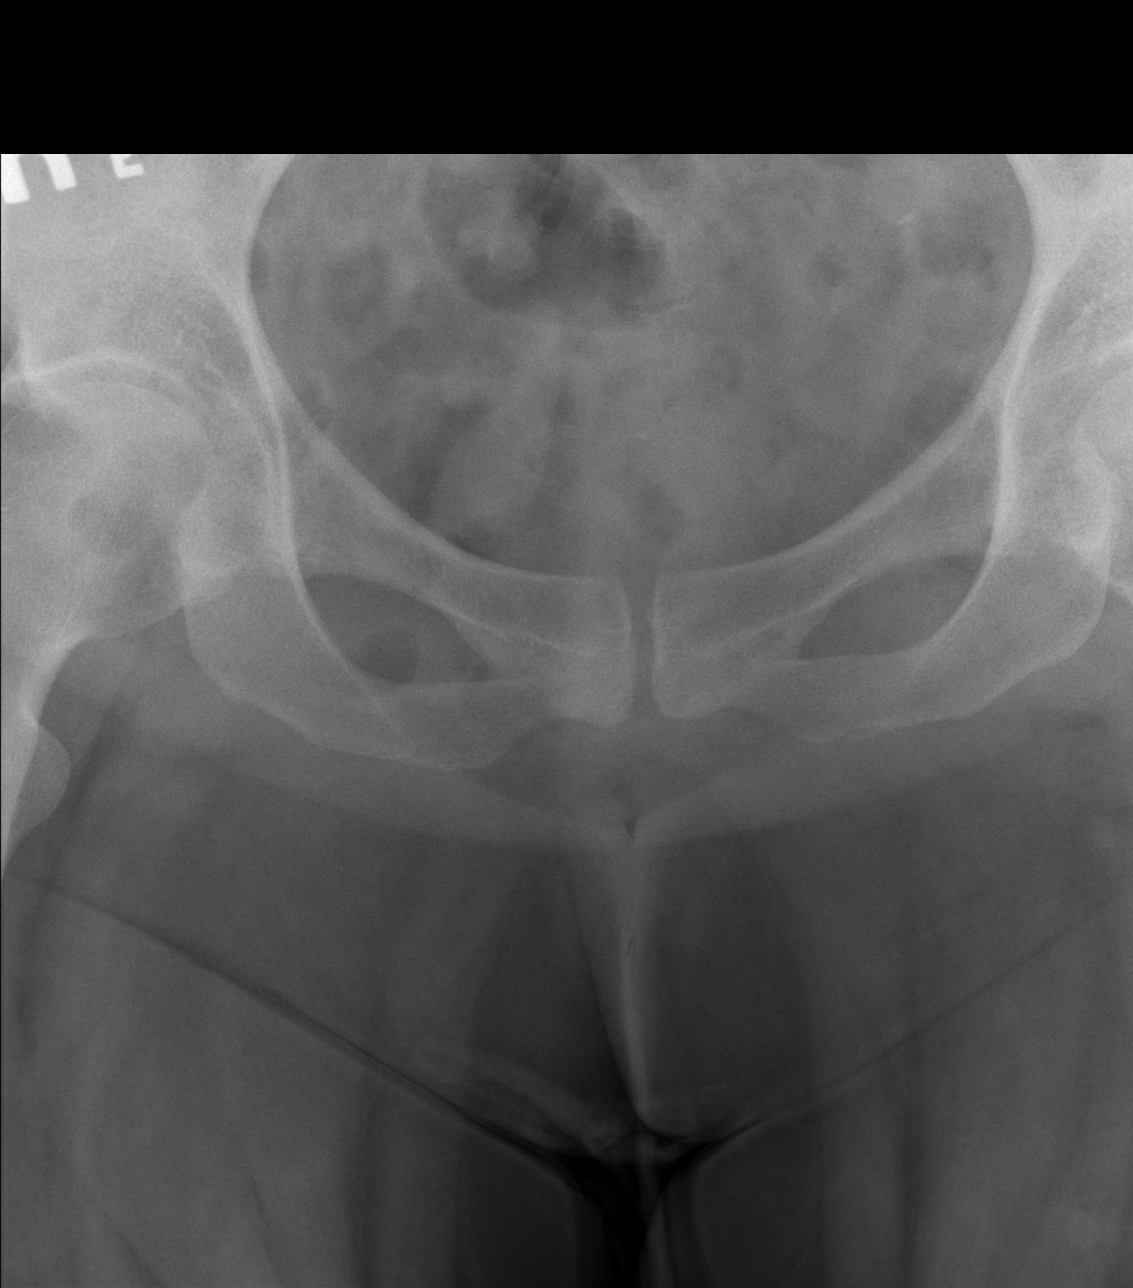

[t sacrum coccyx lat]
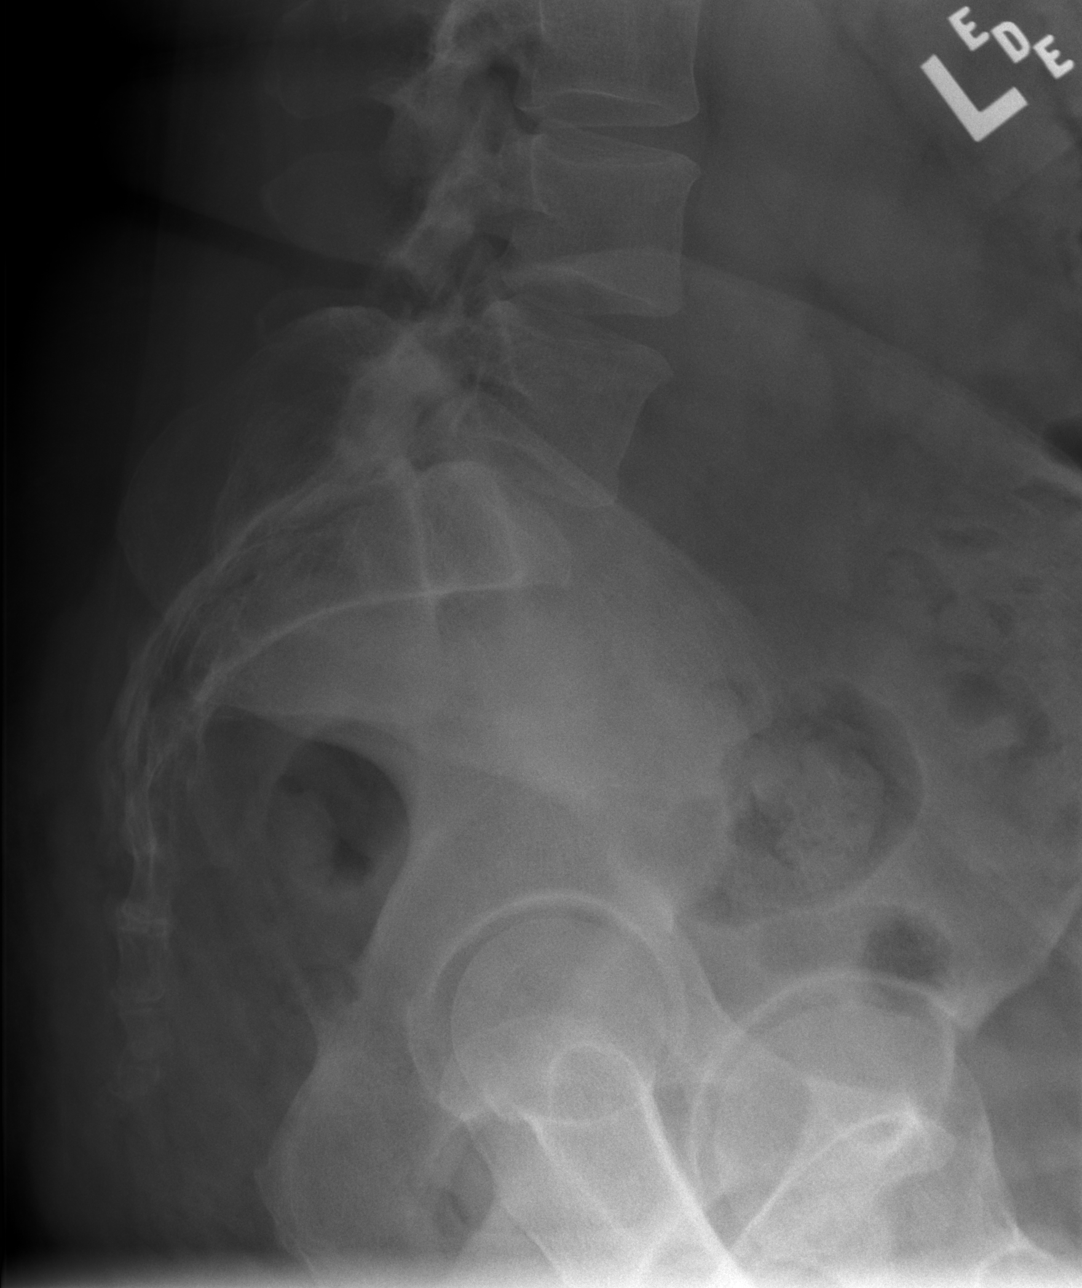

[3 of 3 positions shown; findings below may reference images not displayed]

FINDINGS: Limited evaluation due to overlapping osseous structures and
overlying soft tissues. There is no evidence of fracture or other
focal bone lesions.
IMPRESSION: Negative.

## 2022-04-16 ENCOUNTER — Ambulatory Visit (INDEPENDENT_AMBULATORY_CARE_PROVIDER_SITE_OTHER): Payer: 59 | Admitting: Podiatry

## 2022-04-16 ENCOUNTER — Other Ambulatory Visit: Payer: Self-pay | Admitting: Nurse Practitioner

## 2022-04-16 DIAGNOSIS — L600 Ingrowing nail: Secondary | ICD-10-CM

## 2022-04-16 DIAGNOSIS — L539 Erythematous condition, unspecified: Secondary | ICD-10-CM | POA: Diagnosis not present

## 2022-04-16 MED ORDER — CEPHALEXIN 500 MG PO CAPS: 500.0000 mg | ORAL_CAPSULE | Freq: Three times a day (TID) | ORAL | 0 refills | Status: DC

## 2022-04-24 ENCOUNTER — Other Ambulatory Visit: Payer: Self-pay | Admitting: Pulmonary Disease

## 2022-04-24 DIAGNOSIS — R053 Chronic cough: Secondary | ICD-10-CM

## 2022-04-24 NOTE — Progress Notes (Signed)
errror

## 2022-04-24 NOTE — Progress Notes (Signed)
Subjective: 46 year old female presents the office today for follow-up evaluation of Right hallux total nail avulsion.  She states that she has been doing well but she has some tenderness.  She has noticed some swelling and redness on the base of the nail but still remains.  No drainage or pus.  No red streaks.  Denies any fevers or chills.  No other concerns.  Objective: AAO x3, NAD DP/PT pulses palpable bilaterally, CRT less than 3 seconds Status post total nail avulsion right hallux.  It is similar with edema and erythema with proximal nail fold I think is more inflammatory.  Is no drainage or pus.  No ascending cellulitis.  No fluctuance or crepitation.   No pain with calf compression, swelling, warmth, erythema  Assessment: Status post total nail avulsion with localized erythema  Plan: -All treatment options discussed with the patient including all alternatives, risks, complications.  -Recommend continue Epsom salt soaks daily current antibiotic ointment and a bandage.  Given the localized edema and erythema prescribed Keflex, I think it is still more postinflammatory from the procedure.  If not improved in the next 1 to 2 weeks or any worsening to let me know.  Trula Slade DPM

## 2022-04-25 ENCOUNTER — Encounter: Payer: Self-pay | Admitting: Pulmonary Disease

## 2022-04-25 ENCOUNTER — Ambulatory Visit (INDEPENDENT_AMBULATORY_CARE_PROVIDER_SITE_OTHER): Payer: 59 | Admitting: Pulmonary Disease

## 2022-04-25 VITALS — BP 133/78 | HR 70 | Temp 98.2°F | Ht 64.0 in | Wt 248.6 lb

## 2022-04-25 DIAGNOSIS — R053 Chronic cough: Secondary | ICD-10-CM

## 2022-04-25 NOTE — Patient Instructions (Signed)
Full PFT Performed Today  

## 2022-04-25 NOTE — Patient Instructions (Signed)
I will see you back in 6 months  Continue using Breo -Continue using albuterol as needed  Graded exercise as tolerated  Call us with significant concerns

## 2022-04-25 NOTE — Progress Notes (Signed)
Angela Castillo    [id number]    1975/12/06  Primary Care Physician:Fleming, Vernia Buff, NP  Referring Physician: Gildardo Pounds, NP [place],  [place] [address]  Chief complaint:   Protracted cough  HPI:  Heart cough about a year on and off Cough is a little bit better  Breo seems to be helping  Rarely needs albuterol  Never smoker  No underlying diagnosis of asthma or COPD  The spasms of cough is less at present  She does have underlying history of hypertension, diabetes, history of chronic headaches  Symptomatically Coricidin does help  She is not bringing up any significant secretions at present Cough seems a little bit better in the last few days  Never smoked No significant secondhand smoking history  Worked in [employer] and recently got  No family history of asthma  Outpatient Encounter Medications as of 04/25/2022  Medication Sig   acetaminophen (TYLENOL) 500 MG tablet Take 500-1,000 mg by mouth every 6 (six) hours as needed for mild pain.   albuterol (VENTOLIN HFA) 108 (90 Base) MCG/ACT inhaler INHALE 2 PUFFS INTO THE LUNGS EVERY 6 HOURS AS NEEDED FOR COUGH   amitriptyline (ELAVIL) 25 MG tablet TAKE 1 TABLET(25 MG) BY MOUTH AT BEDTIME   Azelastine-Fluticasone 137-50 MCG/ACT SUSP PLACE 1 SPRAY IN NOSTRIL(S) EVERY 12 HOURS   benzonatate (TESSALON) 200 MG capsule TAKE 1 CAPSULE BY MOUTH 2 TIMES DAILY AS NEEDED FOR COUGH   Biotin 10000 MCG TABS Take 10,000 mcg by mouth daily.   blood glucose meter kit and supplies KIT Dispense based on patient and insurance preference. Use up to four times daily as directed. (FOR ICD-9 250.00, 250.01). Dispense Relion   cephALEXin (KEFLEX) 500 MG capsule Take 1 capsule (500 mg total) by mouth 3 (three) times daily.   dapagliflozin propanediol (FARXIGA) 5 MG TABS tablet Take 1 tablet (5 mg total) by mouth daily before breakfast.   ferrous sulfate 325 (65 FE) MG tablet Take  325 mg by mouth in the morning and at bedtime.   fluticasone furoate-vilanterol (BREO ELLIPTA) 100-25 MCG/ACT AEPB Inhale 1 puff into the lungs daily.   glucose blood (CONTOUR NEXT TEST) test strip USE UP TO FOUR TIMES DAILY AS DIRECTED   ketoconazole (NIZORAL) 2 % cream Apply 1 application topically daily as needed for irritation.   loratadine (CLARITIN) 10 MG tablet Take 1 tablet (10 mg total) by mouth daily.   losartan-hydrochlorothiazide (HYZAAR) 50-12.5 MG tablet TAKE 1 TABLET BY MOUTH EVERY DAY   Menthol, Topical Analgesic, (ICY HOT MEDICATED SPRAY) 16 % LIQD Apply 1 spray topically daily as needed (pain).   metFORMIN (GLUCOPHAGE) 500 MG tablet TAKE 1 TABLET BY MOUTH TWICE A DAY WITH MEALS   Microlet Lancets MISC USE UP TO FOUR TIMES DAILY AS DIRECTED   mupirocin ointment (BACTROBAN) 2 % APPLY TOPICALLY TWICE A DAY   omega-3 acid ethyl esters (LOVAZA) 1 g capsule TAKE 1 CAPSULE BY MOUTH 2 TIMES DAILY.   ondansetron (ZOFRAN-ODT) 4 MG disintegrating tablet Take 4 mg by mouth every 8 (eight) hours as needed for vomiting or nausea.   oxyCODONE (OXY IR/ROXICODONE) 5 MG immediate release tablet Take 1 tablet (5 mg total) by mouth every 6 (six) hours as needed for severe pain.   pantoprazole (PROTONIX) 40 MG tablet TAKE 1 TABLET BY MOUTH EVERY DAY   propranolol (INDERAL) 20 MG tablet TAKE 1 TABLET BY MOUTH TWICE A  DAY   vitamin C (ASCORBIC ACID) 500 MG tablet Take 500 mg by mouth 2 (two) times daily.   No facility-administered encounter medications on file as of 04/25/2022.    Allergies as of 04/25/2022 - Review Complete 03/11/2022  Allergen Reaction Noted   Nsaids  06/20/2019    Past Medical History:  Diagnosis Date   Anemia Dx January 03 2015   Diabetes mellitus without complication ([facility])    Elevated liver enzymes    Essential hypertension Dx Apr 2016   Fatty liver    H/O: upper GI bleed 03/2019    variceal bleed    Headache    History of blood transfusion 03/2019   Liver cirrhosis  secondary to NASH ([place])    Pneumonia 03/2019   Reflux    Thrombocytopenia ([facility])     Past Surgical History:  Procedure Laterality Date   DILITATION & CURRETTAGE/HYSTROSCOPY WITH HYDROTHERMAL ABLATION N/A 11/20/2021   Procedure: DILATATION & CURETTAGE/HYSTEROSCOPY WITH HYDROTHERMAL ABLATION;  Surgeon: [name], MD;  Location: [facility];  Service: Gynecology;  Laterality: N/A;  rep will be here confirmed on 11/12/21 CS   ESOPHAGEAL BANDING  03/22/2019   Procedure: ESOPHAGEAL BANDING;  Surgeon: Wilford Corner, MD;  Location: [facility];  Service: Endoscopy;;   ESOPHAGEAL BANDING  03/23/2019   Procedure: ESOPHAGEAL BANDING;  Surgeon: Wilford Corner, MD;  [facility];  Service: Endoscopy;;   ESOPHAGEAL BANDING  09/20/2019   Procedure: ESOPHAGEAL BANDING;  Surgeon: Wilford Corner, MD;  [facility]: [facility];  Service: Endoscopy;;   ESOPHAGEAL BANDING N/A 12/21/2019   Procedure: ESOPHAGEAL BANDING;  Surgeon: Wilford Corner, MD;  [facility];  Service: Endoscopy;  Laterality: N/A;   ESOPHAGOGASTRODUODENOSCOPY N/A 03/22/2019   Procedure: ESOPHAGOGASTRODUODENOSCOPY (EGD);  Surgeon: Wilford Corner, MD;  [facility];  Service: Endoscopy;  Laterality: N/A;   ESOPHAGOGASTRODUODENOSCOPY N/A 03/23/2019   Procedure: ESOPHAGOGASTRODUODENOSCOPY (EGD);  Surgeon: Wilford Corner, MD;  [facility];  Service: Endoscopy;  Laterality: N/A;   ESOPHAGOGASTRODUODENOSCOPY (EGD) WITH PROPOFOL N/A 07/23/2019   Procedure: ESOPHAGOGASTRODUODENOSCOPY (EGD) WITH PROPOFOL with possible banding;  Surgeon: Wilford Corner, MD;  [facility];  Service: Endoscopy;  Laterality: N/A;   ESOPHAGOGASTRODUODENOSCOPY (EGD) WITH PROPOFOL N/A 09/20/2019   Procedure: ESOPHAGOGASTRODUODENOSCOPY (EGD) WITH PROPOFOL;  Surgeon: Wilford Corner, MD;  [facility];  Service: Endoscopy;  Laterality: N/A;   ESOPHAGOGASTRODUODENOSCOPY (EGD) WITH PROPOFOL N/A  12/21/2019   Procedure: ESOPHAGOGASTRODUODENOSCOPY (EGD) WITH PROPOFOL with possible banding;  Surgeon: Wilford Corner, MD;  [facility];  Service: Endoscopy;  Laterality: N/A;   HERNIA REPAIR     TUBAL LIGATION  07/25/2000   VENTRAL HERNIA REPAIR N/A 07/06/2020   Procedure: VENTRAL HERNIA REPAIR WITH MESH;  Surgeon: Coralie Keens, MD;  Location: [facility];  Service: General;  Laterality: N/A;    Family History  Problem Relation Age of Onset   Diabetes Father    COPD Mother    Esophageal cancer Mother    Diabetes Mother    Cancer Mother        esophageal   Hypertension Mother    Breast cancer Maternal Aunt     Social History   Socioeconomic History   Marital status: Married    Spouse name: Not on file   Number of children: Not on file   Years of education: Not on file   Highest education level: Not on file  Occupational History    Comment: security guard at [employer]  Tobacco Use   Smoking status: Never  Smokeless tobacco: Never  Vaping Use   Vaping Use: Never used  Substance and Sexual Activity   Alcohol use: Yes    Comment: rarely-last alcohol drank in 08/2018   Drug use: No   Sexual activity: Yes  Other Topics Concern   Not on file  Social History Narrative   Not on file   Social Determinants of Health   Financial Resource Strain: Not on file  Food Insecurity: No Food Insecurity (11/01/2021)   Hunger Vital Sign    Worried About Running Out of Food in the Last Year: Never true    Ran Out of Food in the Last Year: Never true  Transportation Needs: No Transportation Needs (11/01/2021)   PRAPARE - Hydrologist (Medical): No    Lack of Transportation (Non-Medical): No  Physical Activity: Not on file  Stress: Not on file  Social Connections: Not on file  Intimate Partner Violence: Not on file    Review of Systems  Respiratory:  Positive for cough and shortness of breath.     There were no vitals filed  for this visit.    Physical Exam Constitutional:      Appearance: She is obese.  HENT:     Head: Normocephalic.     Right Ear: Tympanic membrane normal.     Nose: Nose normal.     Mouth/Throat:     Mouth: Mucous membranes are moist.  Cardiovascular:     Rate and Rhythm: Normal rate and regular rhythm.     Heart sounds: No murmur heard.    No friction rub.  Pulmonary:     Effort: No respiratory distress.     Breath sounds: No stridor. No wheezing or rhonchi.  Musculoskeletal:     Cervical back: No rigidity or tenderness.  Neurological:     Mental Status: She is alert.  Psychiatric:        Mood and Affect: Mood normal.     Data Reviewed: No Recent chest x-ray  PFT with combined mild obstructive disease with mild restriction, normal diffusing capacity  Assessment:  Protracted cough -Has improved slightly  Possible airway hyperactivity  Abnormal PFT showing combined obstruction and restriction    Plan/Recommendations: Continue Breo 100  Continue albuterol as needed  If she improves enough where there is really no cough anymore not having significant symptoms she may elect to stop Breo for a while to watch symptoms  Tentative follow-up in 6 months  Encouraged to call with significant concerns  Sherrilyn Rist MD [facility] 04/25/2022, 4:38 PM  CC: Gildardo Pounds, NP

## 2022-04-26 ENCOUNTER — Other Ambulatory Visit: Payer: Self-pay | Admitting: Family Medicine

## 2022-04-26 ENCOUNTER — Other Ambulatory Visit: Payer: Self-pay | Admitting: Nurse Practitioner

## 2022-04-29 ENCOUNTER — Other Ambulatory Visit (HOSPITAL_COMMUNITY): Payer: Self-pay

## 2022-04-30 ENCOUNTER — Other Ambulatory Visit (HOSPITAL_COMMUNITY): Payer: Self-pay

## 2022-05-01 ENCOUNTER — Other Ambulatory Visit: Payer: Self-pay | Admitting: Pharmacist

## 2022-05-01 MED ORDER — ACCU-CHEK SOFTCLIX LANCETS MISC: 2 refills | Status: DC

## 2022-05-01 MED ORDER — ACCU-CHEK GUIDE W/DEVICE KIT: PACK | 0 refills | Status: AC

## 2022-05-02 LAB — PULMONARY FUNCTION TEST
DL/VA % pred: 110 %
DL/VA: 4.82 ml/min/mmHg/L
DLCO cor % pred: 93 %
DLCO cor: 20.06 ml/min/mmHg
DLCO unc % pred: 93 %
DLCO unc: 20.06 ml/min/mmHg
FEF 25-75 Post: 2.46 L/sec
FEF 25-75 Pre: 1.91 L/sec
FEF2575-%Change-Post: 29 %
FEF2575-%Pred-Post: 83 %
FEF2575-%Pred-Pre: 64 %
FEV1-%Change-Post: 10 %
FEV1-%Pred-Post: 74 %
FEV1-%Pred-Pre: 66 %
FEV1-Post: 2.17 L
FEV1-Pre: 1.95 L
FEV1FVC-%Change-Post: 5 %
FEV1FVC-%Pred-Pre: 98 %
FEV6-%Change-Post: 5 %
FEV6-%Pred-Post: 72 %
FEV6-%Pred-Pre: 69 %
FEV6-Post: 2.59 L
FEV6-Pre: 2.45 L
FEV6FVC-%Pred-Post: 102 %
FEV6FVC-%Pred-Pre: 102 %
FVC-%Change-Post: 5 %
FVC-%Pred-Post: 71 %
FVC-%Pred-Pre: 67 %
FVC-Post: 2.59 L
FVC-Pre: 2.45 L
Post FEV1/FVC ratio: 84 %
Post FEV6/FVC ratio: 100 %
Pre FEV1/FVC ratio: 80 %
Pre FEV6/FVC Ratio: 100 %
RV % pred: 121 %
RV: 2.05 L
TLC % pred: 76 %
TLC: 3.87 L

## 2022-05-11 ENCOUNTER — Other Ambulatory Visit: Payer: Self-pay | Admitting: Nurse Practitioner

## 2022-05-11 DIAGNOSIS — D508 Other iron deficiency anemias: Secondary | ICD-10-CM

## 2022-05-13 ENCOUNTER — Other Ambulatory Visit: Payer: Self-pay | Admitting: Family Medicine

## 2022-05-13 DIAGNOSIS — K219 Gastro-esophageal reflux disease without esophagitis: Secondary | ICD-10-CM

## 2022-05-14 ENCOUNTER — Other Ambulatory Visit (HOSPITAL_COMMUNITY): Payer: Self-pay | Admitting: Gastroenterology

## 2022-05-14 DIAGNOSIS — R1084 Generalized abdominal pain: Secondary | ICD-10-CM

## 2022-05-14 DIAGNOSIS — K7460 Unspecified cirrhosis of liver: Secondary | ICD-10-CM

## 2022-05-14 NOTE — Telephone Encounter (Signed)
Requested Prescriptions  Pending Prescriptions Disp Refills  . propranolol (INDERAL) 20 MG tablet [Pharmacy Med Name: PROPRANOLOL 20 MG TABLET] 60 tablet 0    Sig: TAKE 1 TABLET BY MOUTH TWICE A DAY     Cardiovascular:  Beta Blockers Passed - 05/13/2022  1:48 AM      Passed - Last BP in normal range    BP Readings from Last 1 Encounters:  04/25/22 133/78         Passed - Last Heart Rate in normal range    Pulse Readings from Last 1 Encounters:  04/25/22 70         Passed - Valid encounter within last 6 months    Recent Outpatient Visits          2 months ago Chronic cough   [place], [place] W, NP   3 months ago Chronic cough   [place], [place] W, NP   5 months ago Type 2 diabetes mellitus without complication, without long-term current use of insulin [facility])   [place], [place] W, NP   8 months ago Type 2 diabetes mellitus without complication, without long-term current use of insulin [facility])   [place], [place] W, NP   10 months ago Type 2 diabetes mellitus without complication, without long-term current use of insulin [facility])   [place], Vernia Buff, NP      Future Appointments            In 3 weeks Gildardo Pounds, NP [place]

## 2022-05-20 ENCOUNTER — Ambulatory Visit (HOSPITAL_COMMUNITY)
Admission: RE | Admit: 2022-05-20 | Discharge: 2022-05-20 | Disposition: A | Discharge status: Home / Self Care | Payer: 59 | Source: Ambulatory Visit | Attending: Gastroenterology | Admitting: Gastroenterology

## 2022-05-20 DIAGNOSIS — R1084 Generalized abdominal pain: Secondary | ICD-10-CM | POA: Insufficient documentation

## 2022-05-20 DIAGNOSIS — K7460 Unspecified cirrhosis of liver: Secondary | ICD-10-CM | POA: Insufficient documentation

## 2022-05-20 DIAGNOSIS — E119 Type 2 diabetes mellitus without complications: Secondary | ICD-10-CM | POA: Diagnosis present

## 2022-05-20 LAB — POCT I-STAT CREATININE: Creatinine, Ser: 0.4 mg/dL — ABNORMAL LOW (ref 0.44–1.00)

## 2022-05-20 MED ORDER — IOHEXOL 300 MG/ML  SOLN
100.0000 mL | Freq: Once | INTRAMUSCULAR | Status: AC | PRN
  Administered 2022-05-20: 100 mL via INTRAVENOUS

## 2022-05-26 ENCOUNTER — Other Ambulatory Visit: Payer: Self-pay | Admitting: Nurse Practitioner

## 2022-05-26 DIAGNOSIS — R053 Chronic cough: Secondary | ICD-10-CM

## 2022-05-28 ENCOUNTER — Other Ambulatory Visit (HOSPITAL_COMMUNITY): Payer: Self-pay

## 2022-05-28 NOTE — Telephone Encounter (Signed)
Requested Prescriptions  Pending Prescriptions Disp Refills  . loratadine (CLARITIN) 10 MG tablet [Pharmacy Med Name: LORATADINE 10 MG TABLET] 90 tablet 1    Sig: TAKE 1 TABLET BY MOUTH EVERY DAY     Ear, Nose, and Throat:  Antihistamines 2 Failed - 05/26/2022  9:19 AM      Failed - Cr in normal range and within 360 days    Creat  Date Value Ref Range Status  06/21/2016 0.68 0.50 - 1.10 mg/dL Final   Creatinine, Ser  Date Value Ref Range Status  05/20/2022 0.40 (L) 0.44 - 1.00 mg/dL Final         Passed - Valid encounter within last 12 months    Recent Outpatient Visits          3 months ago Chronic cough   [place], Vernia Buff, NP   3 months ago Chronic cough   [place], [place] W, NP   5 months ago Type 2 diabetes mellitus without complication, without long-term current use of insulin [facility])   [place], [place] W, NP   8 months ago Type 2 diabetes mellitus without complication, without long-term current use of insulin [facility])   [place], [place] W, NP   10 months ago Type 2 diabetes mellitus without complication, without long-term current use of insulin [facility])   [place], Vernia Buff, NP      Future Appointments            In 1 week Gildardo Pounds, NP [place]

## 2022-06-05 ENCOUNTER — Encounter: Payer: Self-pay | Admitting: Nurse Practitioner

## 2022-06-05 ENCOUNTER — Other Ambulatory Visit: Payer: Self-pay

## 2022-06-05 ENCOUNTER — Other Ambulatory Visit: Payer: Self-pay | Admitting: Nurse Practitioner

## 2022-06-05 ENCOUNTER — Ambulatory Visit: Payer: 59 | Attending: Nurse Practitioner | Admitting: Nurse Practitioner

## 2022-06-05 VITALS — BP 113/76 | HR 73 | Temp 98.3°F | Ht 64.0 in | Wt 249.2 lb

## 2022-06-05 DIAGNOSIS — Z1231 Encounter for screening mammogram for malignant neoplasm of breast: Secondary | ICD-10-CM

## 2022-06-05 DIAGNOSIS — E785 Hyperlipidemia, unspecified: Secondary | ICD-10-CM

## 2022-06-05 DIAGNOSIS — L2082 Flexural eczema: Secondary | ICD-10-CM | POA: Diagnosis not present

## 2022-06-05 DIAGNOSIS — E119 Type 2 diabetes mellitus without complications: Secondary | ICD-10-CM

## 2022-06-05 DIAGNOSIS — L570 Actinic keratosis: Secondary | ICD-10-CM

## 2022-06-05 DIAGNOSIS — D696 Thrombocytopenia, unspecified: Secondary | ICD-10-CM

## 2022-06-05 DIAGNOSIS — R109 Unspecified abdominal pain: Secondary | ICD-10-CM

## 2022-06-05 LAB — GLUCOSE, POCT (MANUAL RESULT ENTRY): POC Glucose: 214 mg/dl — AB (ref 70–99)

## 2022-06-05 LAB — POCT GLYCOSYLATED HEMOGLOBIN (HGB A1C): HbA1c, POC (controlled diabetic range): 8.2 % — AB (ref 0.0–7.0)

## 2022-06-05 MED ORDER — TRIAMCINOLONE ACETONIDE 0.025 % EX OINT: 1.0000 | TOPICAL_OINTMENT | Freq: Two times a day (BID) | CUTANEOUS | 1 refills | Status: DC

## 2022-06-05 MED ORDER — OZEMPIC (1 MG/DOSE) 4 MG/3ML ~~LOC~~ SOPN: 0.5000 mg | PEN_INJECTOR | SUBCUTANEOUS | 0 refills | Status: DC

## 2022-06-05 MED ORDER — DAPAGLIFLOZIN PROPANEDIOL 5 MG PO TABS
5.0000 mg | ORAL_TABLET | Freq: Every day | ORAL | 1 refills | Status: DC
  Filled 2022-06-05: qty 90, 90d supply, fill #0

## 2022-06-05 MED ORDER — OZEMPIC (0.25 OR 0.5 MG/DOSE) 2 MG/3ML ~~LOC~~ SOPN: 0.5000 mg | PEN_INJECTOR | SUBCUTANEOUS | 1 refills | Status: DC

## 2022-06-05 MED ORDER — DAPAGLIFLOZIN PROPANEDIOL 5 MG PO TABS: 5.0000 mg | ORAL_TABLET | Freq: Every day | ORAL | 1 refills | Status: DC

## 2022-06-05 NOTE — Patient Instructions (Signed)
Fasting readings: 90-130  1-2 hours after meals should be less than 180

## 2022-06-05 NOTE — Progress Notes (Unsigned)
Pt has hard cyst on her right elbow.

## 2022-06-05 NOTE — Progress Notes (Unsigned)
Assessment & Plan:  Angela Castillo was seen today for diabetes.  Diagnoses and all orders for this visit:  Type 2 diabetes mellitus without complication, without long-term current use of insulin (HCC) -     POCT glycosylated hemoglobin (Hb A1C) -     POCT glucose (manual entry) -     Discontinue: dapagliflozin propanediol (FARXIGA) 5 MG TABS tablet; Take 1 tablet (5 mg total) by mouth daily before breakfast. -     dapagliflozin propanediol (FARXIGA) 5 MG TABS tablet; Take 1 tablet (5 mg total) by mouth daily before breakfast. -     CMP14+EGFR  Flexural eczema -     triamcinolone (KENALOG) 0.025 % ointment; Apply 1 Application topically 2 (two) times daily.  Keratosis -     Ambulatory referral to Dermatology  Breast cancer screening by mammogram -     MM 3D SCREEN BREAST BILATERAL; Future  Flank pain -     Urinalysis, Complete -     Microscopic Examination  Thrombocytopenia (HCC) -     CBC with Differential      Patient has been counseled on age-appropriate routine health concerns for screening and prevention. These are reviewed and up-to-date. Referrals have been placed accordingly. Immunizations are up-to-date or declined.    Subjective:   Chief Complaint  Patient presents with   Diabetes   HPI Angela Castillo 46 y.o. female presents to office today for follow up to DM.   She has a past medical history of Anemia (Dx January 03 2015), DM2,  Elevated liver enzymes, Essential hypertension (Dx Apr 2016), Fatty liver, H/O: upper GI bleed (03/2019), Headache,  Liver cirrhosis secondary to NASH, Pneumonia (03/2019), Reflux, and Thrombocytopenia (stable)  DM 2 Not well controlled with metformin 500 mg BID and Farxiga 5 mg daily. Will add ozempic 0.5 mg weekly today. If continues elevated will max out farxiga prior to increasing ozempic.  Lipids not at goal with lovaza 1gm BID. Will add crestor 10 mg.  Lab Results  Component Value Date   HGBA1C 8.2 (A) 06/05/2022   Lab  Results  Component Value Date   LDLCALC 104 (H) 12/05/2021     SKIN She has keratotic appearing lesions on both elbows and a papular lesion that is forming out of a tattoo she had placed a few months ago on her left forearm.  The papular lesion will crust and bleed at times when manipulated.  Notes flank pain that alternates from right to left side. Denies dysuria, frequency, hesitancy or pelvic pain.    Review of Systems  Constitutional:  Negative for fever, malaise/fatigue and weight loss.  HENT: Negative.  Negative for nosebleeds.   Eyes: Negative.  Negative for blurred vision, double vision and photophobia.  Respiratory: Negative.  Negative for cough and shortness of breath.   Cardiovascular: Negative.  Negative for chest pain, palpitations and leg swelling.  Gastrointestinal: Negative.  Negative for heartburn, nausea and vomiting.  Genitourinary:  Positive for flank pain. Negative for dysuria, frequency, hematuria and urgency.  Musculoskeletal:  Negative for myalgias.  Skin:  Positive for itching and rash.       SEE HPI  Neurological: Negative.  Negative for dizziness, focal weakness, seizures and headaches.  Psychiatric/Behavioral: Negative.  Negative for suicidal ideas.     Past Medical History:  Diagnosis Date   Anemia Dx January 03 2015   Diabetes mellitus without complication ([place])    Elevated liver enzymes    Essential hypertension Dx Apr 2016   Fatty  liver    H/O: upper GI bleed 03/2019    variceal bleed    Headache    History of blood transfusion 03/2019   Liver cirrhosis secondary to NASH ([place])    Pneumonia 03/2019   Reflux    Thrombocytopenia (HCC)     Past Surgical History:  Procedure Laterality Date   DILITATION & CURRETTAGE/HYSTROSCOPY WITH HYDROTHERMAL ABLATION N/A 11/20/2021   Procedure: DILATATION & CURETTAGE/HYSTEROSCOPY WITH HYDROTHERMAL ABLATION;  Surgeon: [name], MD;  Location: [facility];  Service: Gynecology;  Laterality: N/A;  rep will be here  confirmed on 11/12/21 CS   ESOPHAGEAL BANDING  03/22/2019   Procedure: ESOPHAGEAL BANDING;  Surgeon: Wilford Corner, MD;  [facility]: [facility];  Service: Endoscopy;;   ESOPHAGEAL BANDING  03/23/2019   Procedure: ESOPHAGEAL BANDING;  Surgeon: Wilford Corner, MD;  [facility];  Service: Endoscopy;;   ESOPHAGEAL BANDING  09/20/2019   Procedure: ESOPHAGEAL BANDING;  Surgeon: Wilford Corner, MD;  [facility];  Service: Endoscopy;;   ESOPHAGEAL BANDING N/A 12/21/2019   Procedure: ESOPHAGEAL BANDING;  Surgeon: Wilford Corner, MD;  [facility];  Service: Endoscopy;  Laterality: N/A;   ESOPHAGOGASTRODUODENOSCOPY N/A 03/22/2019   Procedure: ESOPHAGOGASTRODUODENOSCOPY (EGD);  Surgeon: Wilford Corner, MD;  [facility];  Service: Endoscopy;  Laterality: N/A;   ESOPHAGOGASTRODUODENOSCOPY N/A 03/23/2019   Procedure: ESOPHAGOGASTRODUODENOSCOPY (EGD);  Surgeon: Wilford Corner, MD;  [facility];  Service: Endoscopy;  Laterality: N/A;   ESOPHAGOGASTRODUODENOSCOPY (EGD) WITH PROPOFOL N/A 07/23/2019   Procedure: ESOPHAGOGASTRODUODENOSCOPY (EGD) WITH PROPOFOL with possible banding;  Surgeon: Wilford Corner, MD;  [facility];  Service: Endoscopy;  Laterality: N/A;   ESOPHAGOGASTRODUODENOSCOPY (EGD) WITH PROPOFOL N/A 09/20/2019   Procedure: ESOPHAGOGASTRODUODENOSCOPY (EGD) WITH PROPOFOL;  Surgeon: Wilford Corner, MD;  [facility];  Service: Endoscopy;  Laterality: N/A;   ESOPHAGOGASTRODUODENOSCOPY (EGD) WITH PROPOFOL N/A 12/21/2019   Procedure: ESOPHAGOGASTRODUODENOSCOPY (EGD) WITH PROPOFOL with possible banding;  Surgeon: Wilford Corner, MD;  [facility];  Service: Endoscopy;  Laterality: N/A;   HERNIA REPAIR     TUBAL LIGATION  07/25/2000   VENTRAL HERNIA REPAIR N/A 07/06/2020   Procedure: VENTRAL HERNIA REPAIR WITH MESH;  Surgeon: Coralie Keens, MD;  Location: [facility];  Service: General;   Laterality: N/A;    Family History  Problem Relation Age of Onset   Diabetes Father    COPD Mother    Esophageal cancer Mother    Diabetes Mother    Cancer Mother        esophageal   Hypertension Mother    Breast cancer Maternal Aunt     Social History Reviewed with no changes to be made today.   Outpatient Medications Prior to Visit  Medication Sig Dispense Refill   Accu-Chek Softclix Lancets lancets Use to check blood sugar three times daily. 100 each 2   acetaminophen (TYLENOL) 500 MG tablet Take 500-1,000 mg by mouth every 6 (six) hours as needed for mild pain.     albuterol (VENTOLIN HFA) 108 (90 Base) MCG/ACT inhaler INHALE 2 PUFFS INTO THE LUNGS EVERY 6 HOURS AS NEEDED FOR COUGH 8 each 4   amitriptyline (ELAVIL) 25 MG tablet TAKE 1 TABLET(25 MG) BY MOUTH AT BEDTIME 90 tablet 1   Azelastine-Fluticasone 137-50 MCG/ACT SUSP PLACE 1 SPRAY IN NOSTRIL(S) EVERY 12 HOURS 23 g 0   benzonatate (TESSALON) 200 MG capsule TAKE 1 CAPSULE BY MOUTH 2 TIMES DAILY AS NEEDED FOR COUGH 20 capsule 0   Biotin 10000 MCG TABS Take 10,000 mcg  by mouth daily.     Blood Glucose Monitoring Suppl (ACCU-CHEK GUIDE) w/Device KIT Use to check blood sugar three times daily. 1 kit 0   ferrous sulfate 325 (65 FE) MG tablet TAKE 1 TABLET BY MOUTH 2 TIMES DAILY WITH A MEAL. 180 tablet 0   fluticasone furoate-vilanterol (BREO ELLIPTA) 100-25 MCG/ACT AEPB Inhale 1 puff into the lungs daily. 1 each 3   glucose blood (ACCU-CHEK GUIDE) test strip Use to check blood sugar three times daily. 100 strip 3   ketoconazole (NIZORAL) 2 % cream Apply 1 application topically daily as needed for irritation.     loratadine (CLARITIN) 10 MG tablet TAKE 1 TABLET BY MOUTH EVERY DAY 90 tablet 1   losartan-hydrochlorothiazide (HYZAAR) 50-12.5 MG tablet TAKE 1 TABLET BY MOUTH EVERY DAY 90 tablet 1   Menthol, Topical Analgesic, (ICY HOT MEDICATED SPRAY) 16 % LIQD Apply 1 spray topically daily as needed (pain).     metFORMIN  (GLUCOPHAGE) 500 MG tablet TAKE 1 TABLET BY MOUTH TWICE A DAY WITH MEALS 180 tablet 1   Microlet Lancets MISC USE UP TO FOUR TIMES DAILY AS DIRECTED 100 each 11   mupirocin ointment (BACTROBAN) 2 % APPLY TOPICALLY TWICE A DAY 22 g 0   omega-3 acid ethyl esters (LOVAZA) 1 g capsule TAKE 1 CAPSULE BY MOUTH 2 TIMES DAILY. 180 capsule 1   ondansetron (ZOFRAN-ODT) 4 MG disintegrating tablet Take 4 mg by mouth every 8 (eight) hours as needed for vomiting or nausea.     pantoprazole (PROTONIX) 40 MG tablet TAKE 1 TABLET BY MOUTH EVERY DAY 90 tablet 1   vitamin C (ASCORBIC ACID) 500 MG tablet Take 500 mg by mouth 2 (two) times daily.     dapagliflozin propanediol (FARXIGA) 5 MG TABS tablet Take 1 tablet (5 mg total) by mouth daily before breakfast. 90 tablet 1   propranolol (INDERAL) 20 MG tablet TAKE 1 TABLET BY MOUTH TWICE A DAY 60 tablet 0   blood glucose meter kit and supplies KIT Dispense based on patient and insurance preference. Use up to four times daily as directed. (FOR ICD-9 250.00, 250.01). Dispense [place] (Patient not taking: Reported on 06/05/2022) 1 each 0   cephALEXin (KEFLEX) 500 MG capsule Take 1 capsule (500 mg total) by mouth 3 (three) times daily. (Patient not taking: Reported on 06/05/2022) 21 capsule 0   No facility-administered medications prior to visit.    Allergies  Allergen Reactions   Nsaids     GI BLEED       Objective:    BP 113/76   Pulse 73   Temp 98.3 F (36.8 C) (Oral)   Ht 5' 4"  (1.626 m)   Wt 249 lb 3.2 oz (113 kg)   SpO2 98%   BMI 42.78 kg/m  Wt Readings from Last 3 Encounters:  06/05/22 249 lb 3.2 oz (113 kg)  04/25/22 248 lb 9.6 oz (112.8 kg)  03/11/22 248 lb (112.5 kg)    Physical Exam Vitals and nursing note reviewed.  Constitutional:      Appearance: She is well-developed.  HENT:     Head: Normocephalic and atraumatic.  Cardiovascular:     Rate and Rhythm: Normal rate and regular rhythm.     Heart sounds: Normal heart sounds. No murmur  heard.    No friction rub. No gallop.  Pulmonary:     Effort: Pulmonary effort is normal. No tachypnea or respiratory distress.     Breath sounds: Normal breath sounds. No decreased breath  sounds, wheezing, rhonchi or rales.  Chest:     Chest wall: No tenderness.  Abdominal:     General: Bowel sounds are normal.     Palpations: Abdomen is soft.  Musculoskeletal:        General: Normal range of motion.     Cervical back: Normal range of motion.  Skin:    General: Skin is warm and dry.       Neurological:     Mental Status: She is alert and oriented to person, place, and time.     Coordination: Coordination normal.  Psychiatric:        Behavior: Behavior normal. Behavior is cooperative.        Thought Content: Thought content normal.        Judgment: Judgment normal.          Patient has been counseled extensively about nutrition and exercise as well as the importance of adherence with medications and regular follow-up. The patient was given clear instructions to go to ER or return to medical center if symptoms don't improve, worsen or new problems develop. The patient verbalized understanding.   Follow-up: Return for 4 weeks virtual on a Tuesday for meter check.  Pap smear in October. Gildardo Pounds, FNP-BC [facility] and [facility] [place], [place]   06/06/2022, 10:29 PM

## 2022-06-06 ENCOUNTER — Encounter: Payer: Self-pay | Admitting: Nurse Practitioner

## 2022-06-06 ENCOUNTER — Other Ambulatory Visit: Payer: Self-pay | Admitting: Family Medicine

## 2022-06-06 DIAGNOSIS — K219 Gastro-esophageal reflux disease without esophagitis: Secondary | ICD-10-CM

## 2022-06-06 LAB — URINALYSIS, COMPLETE
Bilirubin, UA: NEGATIVE
Ketones, UA: NEGATIVE
Leukocytes,UA: NEGATIVE
Nitrite, UA: NEGATIVE
Protein,UA: NEGATIVE
RBC, UA: NEGATIVE
Specific Gravity, UA: >=1.03 — AB (ref 1.005–1.030)
Urobilinogen, Ur: 1 mg/dL (ref 0.2–1.0)
pH, UA: 6.5 (ref 5.0–7.5)

## 2022-06-06 LAB — CMP14+EGFR
ALT: 65 IU/L — ABNORMAL HIGH (ref 0–32)
AST: 49 IU/L — ABNORMAL HIGH (ref 0–40)
Albumin/Globulin Ratio: 1.1 — ABNORMAL LOW (ref 1.2–2.2)
Albumin: 4.6 g/dL (ref 3.9–4.9)
Alkaline Phosphatase: 179 IU/L — ABNORMAL HIGH (ref 44–121)
BUN/Creatinine Ratio: 12 (ref 9–23)
BUN: 8 mg/dL (ref 6–24)
Bilirubin Total: 0.6 mg/dL (ref 0.0–1.2)
CO2: 29 mmol/L (ref 20–29)
Calcium: 9.9 mg/dL (ref 8.7–10.2)
Chloride: 97 mmol/L (ref 96–106)
Creatinine, Ser: 0.68 mg/dL (ref 0.57–1.00)
Globulin, Total: 4.3 g/dL (ref 1.5–4.5)
Glucose: 166 mg/dL — ABNORMAL HIGH (ref 70–99)
Potassium: 3.9 mmol/L (ref 3.5–5.2)
Sodium: 140 mmol/L (ref 134–144)
Total Protein: 8.9 g/dL — ABNORMAL HIGH (ref 6.0–8.5)
eGFR: 109 mL/min/{1.73_m2} (ref >59)

## 2022-06-06 LAB — CBC WITH DIFFERENTIAL/PLATELET
Basophils Absolute: 0 10*3/uL (ref 0.0–0.2)
Basos: 0 %
EOS (ABSOLUTE): 0.2 10*3/uL (ref 0.0–0.4)
Eos: 3 %
Hematocrit: 42.8 % (ref 34.0–46.6)
Hemoglobin: 14.1 g/dL (ref 11.1–15.9)
Immature Grans (Abs): 0 10*3/uL (ref 0.0–0.1)
Immature Granulocytes: 0 %
Lymphocytes Absolute: 1.5 10*3/uL (ref 0.7–3.1)
Lymphs: 30 %
MCH: 28 pg (ref 26.6–33.0)
MCHC: 32.9 g/dL (ref 31.5–35.7)
MCV: 85 fL (ref 79–97)
Monocytes Absolute: 0.3 10*3/uL (ref 0.1–0.9)
Monocytes: 6 %
Neutrophils Absolute: 3.1 10*3/uL (ref 1.4–7.0)
Neutrophils: 61 %
RBC: 5.04 x10E6/uL (ref 3.77–5.28)
RDW: 14.5 % (ref 11.7–15.4)
WBC: 5.2 10*3/uL (ref 3.4–10.8)

## 2022-06-06 LAB — MICROSCOPIC EXAMINATION
Bacteria, UA: NONE SEEN
Casts: NONE SEEN /lpf

## 2022-06-06 MED ORDER — ROSUVASTATIN CALCIUM 10 MG PO TABS
10.0000 mg | ORAL_TABLET | Freq: Every day | ORAL | 3 refills | Status: DC
  Filled 2022-06-06: qty 90, 90d supply, fill #0

## 2022-06-06 MED ORDER — ROSUVASTATIN CALCIUM 10 MG PO TABS: 10.0000 mg | ORAL_TABLET | Freq: Every day | ORAL | 3 refills | Status: DC

## 2022-06-07 ENCOUNTER — Other Ambulatory Visit: Payer: Self-pay

## 2022-06-10 ENCOUNTER — Other Ambulatory Visit: Payer: Self-pay | Admitting: Nurse Practitioner

## 2022-06-10 ENCOUNTER — Other Ambulatory Visit: Payer: Self-pay

## 2022-06-10 ENCOUNTER — Encounter: Payer: Self-pay | Admitting: Nurse Practitioner

## 2022-06-10 MED ORDER — ONDANSETRON 4 MG PO TBDP
4.0000 mg | ORAL_TABLET | Freq: Three times a day (TID) | ORAL | 1 refills | Status: DC | PRN
  Filled 2022-06-10 (×2): qty 30, 10d supply, fill #0

## 2022-06-11 ENCOUNTER — Other Ambulatory Visit: Payer: Self-pay

## 2022-06-13 ENCOUNTER — Encounter: Payer: Self-pay | Admitting: Nurse Practitioner

## 2022-06-14 ENCOUNTER — Other Ambulatory Visit: Payer: Self-pay

## 2022-06-17 ENCOUNTER — Other Ambulatory Visit: Payer: Self-pay

## 2022-06-26 ENCOUNTER — Encounter: Payer: Self-pay | Admitting: Nurse Practitioner

## 2022-07-01 ENCOUNTER — Other Ambulatory Visit: Payer: Self-pay | Admitting: Pulmonary Disease

## 2022-07-01 ENCOUNTER — Other Ambulatory Visit: Payer: Self-pay | Admitting: Family Medicine

## 2022-07-02 NOTE — Telephone Encounter (Signed)
Requested medication (s) are due for refill today:   Yes  prescribed yesterday 8/28  Requested medication (s) are on the active medication list:   Yes  Future visit scheduled:   Yes   Last ordered: 05/01/2022 #100, 2 refills  Returned because pharmacy requesting a 90 day fill and needs a new Rx.   Requested Prescriptions  Pending Prescriptions Disp Refills   Accu-Chek Softclix Lancets lancets [Pharmacy Med Name: ACCU-CHEK SOFTCLIX LANCETS] 100 each 2    Sig: Use to check blood sugar three times daily.     Endocrinology: Diabetes - Testing Supplies Passed - 07/01/2022  9:45 AM      Passed - Valid encounter within last 12 months    Recent Outpatient Visits           3 weeks ago Type 2 diabetes mellitus without complication, without long-term current use of insulin ([place])   [place], Vernia Buff, NP   4 months ago Chronic cough   [place], Vernia Buff, NP   5 months ago Chronic cough   [place], [place] W, NP   6 months ago Type 2 diabetes mellitus without complication, without long-term current use of insulin [facility])   [place], [place] W, NP   10 months ago Type 2 diabetes mellitus without complication, without long-term current use of insulin [facility])   [place], Vernia Buff, NP       Future Appointments             In 2 weeks Gildardo Pounds, NP [place]   In 1 month Gildardo Pounds, NP [place]

## 2022-07-15 ENCOUNTER — Encounter: Payer: Self-pay | Admitting: Nurse Practitioner

## 2022-07-16 ENCOUNTER — Telehealth: Payer: 59 | Admitting: Nurse Practitioner

## 2022-07-16 ENCOUNTER — Encounter: Payer: Self-pay | Admitting: Nurse Practitioner

## 2022-07-25 ENCOUNTER — Other Ambulatory Visit: Payer: Self-pay | Admitting: Family Medicine

## 2022-07-25 DIAGNOSIS — D508 Other iron deficiency anemias: Secondary | ICD-10-CM

## 2022-07-26 ENCOUNTER — Other Ambulatory Visit: Payer: Self-pay | Admitting: Family Medicine

## 2022-07-26 ENCOUNTER — Ambulatory Visit: Payer: 59 | Attending: Nurse Practitioner

## 2022-07-26 ENCOUNTER — Other Ambulatory Visit: Payer: Self-pay | Admitting: Nurse Practitioner

## 2022-07-26 DIAGNOSIS — E785 Hyperlipidemia, unspecified: Secondary | ICD-10-CM

## 2022-07-26 DIAGNOSIS — I10 Essential (primary) hypertension: Secondary | ICD-10-CM

## 2022-07-26 DIAGNOSIS — R519 Headache, unspecified: Secondary | ICD-10-CM

## 2022-07-26 NOTE — Telephone Encounter (Signed)
Requested Prescriptions  Pending Prescriptions Disp Refills  . amitriptyline (ELAVIL) 25 MG tablet [Pharmacy Med Name: AMITRIPTYLINE 25MG TABLETS] 90 tablet 0    Sig: TAKE 1 TABLET(25 MG) BY MOUTH AT BEDTIME     Psychiatry:  Antidepressants - Heterocyclics (TCAs) Passed - 07/26/2022 10:42 AM      Passed - Valid encounter within last 6 months    Recent Outpatient Visits          1 month ago Type 2 diabetes mellitus without complication, without long-term current use of insulin ([place])   [place], Vernia Buff, NP   5 months ago Chronic cough   [place], [place] W, NP   5 months ago Chronic cough   [place], [place] W, NP   7 months ago Type 2 diabetes mellitus without complication, without long-term current use of insulin [facility])   [place], [place] W, NP   10 months ago Type 2 diabetes mellitus without complication, without long-term current use of insulin [facility])   [place], Vernia Buff, NP      Future Appointments            In 3 weeks Gildardo Pounds, NP [employer]

## 2022-07-27 LAB — CMP14+EGFR
ALT: 33 IU/L — ABNORMAL HIGH (ref 0–32)
AST: 31 IU/L (ref 0–40)
Albumin/Globulin Ratio: 1.1 — ABNORMAL LOW (ref 1.2–2.2)
Albumin: 4.3 g/dL (ref 3.9–4.9)
Alkaline Phosphatase: 111 IU/L (ref 44–121)
BUN/Creatinine Ratio: 14 (ref 9–23)
BUN: 9 mg/dL (ref 6–24)
Bilirubin Total: 1 mg/dL (ref 0.0–1.2)
CO2: 26 mmol/L (ref 20–29)
Calcium: 9.9 mg/dL (ref 8.7–10.2)
Chloride: 100 mmol/L (ref 96–106)
Creatinine, Ser: 0.65 mg/dL (ref 0.57–1.00)
Globulin, Total: 3.9 g/dL (ref 1.5–4.5)
Glucose: 78 mg/dL (ref 70–99)
Potassium: 4.1 mmol/L (ref 3.5–5.2)
Sodium: 141 mmol/L (ref 134–144)
Total Protein: 8.2 g/dL (ref 6.0–8.5)
eGFR: 111 mL/min/{1.73_m2} (ref >59)

## 2022-07-27 LAB — LIPID PANEL
Chol/HDL Ratio: 3.7 ratio (ref 0.0–4.4)
Cholesterol, Total: 125 mg/dL (ref 100–199)
HDL: 34 mg/dL — ABNORMAL LOW (ref >39)
LDL Chol Calc (NIH): 73 mg/dL (ref 0–99)
Triglycerides: 95 mg/dL (ref 0–149)
VLDL Cholesterol Cal: 18 mg/dL (ref 5–40)

## 2022-07-29 ENCOUNTER — Other Ambulatory Visit: Payer: Self-pay | Admitting: Family Medicine

## 2022-07-29 MED ORDER — MUPIROCIN 2 % EX OINT
TOPICAL_OINTMENT | Freq: Two times a day (BID) | CUTANEOUS | 0 refills | Status: DC
  Filled 2022-07-29: qty 22, 10d supply, fill #0

## 2022-07-30 ENCOUNTER — Other Ambulatory Visit (HOSPITAL_COMMUNITY): Payer: Self-pay

## 2022-08-07 ENCOUNTER — Ambulatory Visit
Admission: RE | Admit: 2022-08-07 | Discharge: 2022-08-07 | Disposition: A | Discharge status: Home / Self Care | Payer: 59 | Source: Ambulatory Visit | Attending: Nurse Practitioner | Admitting: Nurse Practitioner

## 2022-08-07 DIAGNOSIS — Z1231 Encounter for screening mammogram for malignant neoplasm of breast: Secondary | ICD-10-CM

## 2022-08-09 ENCOUNTER — Encounter: Payer: Self-pay | Admitting: Nurse Practitioner

## 2022-08-09 ENCOUNTER — Other Ambulatory Visit: Payer: Self-pay | Admitting: Family Medicine

## 2022-08-09 NOTE — Telephone Encounter (Signed)
Refilled 04/17/2022 #90 1 rf Requested Prescriptions  Pending Prescriptions Disp Refills  . omega-3 acid ethyl esters (LOVAZA) 1 g capsule [Pharmacy Med Name: OMEGA-3 ETHYL ESTERS 1 GM CAP] 180 capsule 1    Sig: TAKE 1 CAPSULE BY MOUTH TWICE A DAY     Endocrinology:  Nutritional Agents - omega-3 acid ethyl esters Failed - 08/09/2022  2:07 AM      Failed - Lipid Panel in normal range within the last 12 months    Cholesterol, Total  Date Value Ref Range Status  07/26/2022 125 100 - 199 mg/dL Final   LDL Chol Calc (NIH)  Date Value Ref Range Status  07/26/2022 73 0 - 99 mg/dL Final   HDL  Date Value Ref Range Status  07/26/2022 34 (L) >39 mg/dL Final   Triglycerides  Date Value Ref Range Status  07/26/2022 95 0 - 149 mg/dL Final         Passed - Valid encounter within last 12 months    Recent Outpatient Visits          2 months ago Type 2 diabetes mellitus without complication, without long-term current use of insulin ([place])   [place], Vernia Buff, NP   5 months ago Chronic cough   [place], Vernia Buff, NP   6 months ago Chronic cough   [place], [place] W, NP   8 months ago Type 2 diabetes mellitus without complication, without long-term current use of insulin [facility])   [place], [place] W, NP   11 months ago Type 2 diabetes mellitus without complication, without long-term current use of insulin [facility])   [place], Vernia Buff, NP      Future Appointments            In 1 week Gildardo Pounds, NP [place]

## 2022-08-12 ENCOUNTER — Other Ambulatory Visit: Payer: Self-pay | Admitting: Family Medicine

## 2022-08-15 ENCOUNTER — Other Ambulatory Visit: Payer: Self-pay | Admitting: Pharmacist

## 2022-08-15 DIAGNOSIS — E118 Type 2 diabetes mellitus with unspecified complications: Secondary | ICD-10-CM

## 2022-08-15 MED ORDER — LOSARTAN POTASSIUM-HCTZ 50-12.5 MG PO TABS: 1.0000 | ORAL_TABLET | Freq: Every day | ORAL | 0 refills | Status: DC

## 2022-08-15 MED ORDER — METFORMIN HCL 500 MG PO TABS: 500.0000 mg | ORAL_TABLET | Freq: Two times a day (BID) | ORAL | 0 refills | Status: DC

## 2022-08-19 ENCOUNTER — Encounter: Payer: Self-pay | Admitting: Nurse Practitioner

## 2022-08-19 ENCOUNTER — Other Ambulatory Visit (HOSPITAL_COMMUNITY)
Admission: RE | Admit: 2022-08-19 | Discharge: 2022-08-19 | Disposition: A | Discharge status: Home / Self Care | Payer: 59 | Source: Ambulatory Visit

## 2022-08-19 ENCOUNTER — Ambulatory Visit: Payer: 59 | Attending: Nurse Practitioner | Admitting: Nurse Practitioner

## 2022-08-19 ENCOUNTER — Other Ambulatory Visit (HOSPITAL_COMMUNITY)
Admission: RE | Admit: 2022-08-19 | Discharge: 2022-08-19 | Disposition: A | Discharge status: Home / Self Care | Payer: 59 | Source: Ambulatory Visit | Attending: Nurse Practitioner | Admitting: Nurse Practitioner

## 2022-08-19 VITALS — BP 127/81 | HR 72 | Temp 98.0°F | Ht 64.0 in | Wt 243.2 lb

## 2022-08-19 DIAGNOSIS — Z124 Encounter for screening for malignant neoplasm of cervix: Secondary | ICD-10-CM | POA: Insufficient documentation

## 2022-08-19 DIAGNOSIS — Z23 Encounter for immunization: Secondary | ICD-10-CM | POA: Diagnosis not present

## 2022-08-19 NOTE — Progress Notes (Signed)
Assessment & Plan:  Angela Castillo was seen today for gynecologic exam.  Diagnoses and all orders for this visit:  Encounter for Papanicolaou smear for cervical cancer screening -     Cervicovaginal ancillary only -     Cytology - PAP    Patient has been counseled on age-appropriate routine health concerns for screening and prevention. These are reviewed and up-to-date. Referrals have been placed accordingly. Immunizations are up-to-date or declined.    Subjective:   Chief Complaint  Patient presents with   Gynecologic Exam   HPI Angela Castillo 46 y.o. female presents to office today for well woman exam.  MAMMOGRAM: UTD  Review of Systems  Constitutional: Negative.  Negative for chills, fever, malaise/fatigue and weight loss.  Respiratory: Negative.  Negative for cough, shortness of breath and wheezing.   Cardiovascular: Negative.  Negative for chest pain, orthopnea and leg swelling.  Gastrointestinal:  Negative for abdominal pain.  Genitourinary: Negative.  Negative for flank pain.  Skin: Negative.  Negative for rash.  Psychiatric/Behavioral:  Negative for suicidal ideas.     Past Medical History:  Diagnosis Date   Anemia Dx January 03 2015   Diabetes mellitus without complication ([place])    Elevated liver enzymes    Essential hypertension Dx Apr 2016   Fatty liver    H/O: upper GI bleed 03/2019    variceal bleed    Headache    History of blood transfusion 03/2019   Liver cirrhosis secondary to NASH ([place])    Pneumonia 03/2019   Reflux    Thrombocytopenia (HCC)     Past Surgical History:  Procedure Laterality Date   DILITATION & CURRETTAGE/HYSTROSCOPY WITH HYDROTHERMAL ABLATION N/A 11/20/2021   Procedure: DILATATION & CURETTAGE/HYSTEROSCOPY WITH HYDROTHERMAL ABLATION;  Surgeon: [name], MD;  Location: [facility];  Service: Gynecology;  Laterality: N/A;  rep will be here confirmed on 11/12/21 CS   ESOPHAGEAL BANDING  03/22/2019   Procedure: ESOPHAGEAL BANDING;   Surgeon: Wilford Corner, MD;  [facility];  Service: Endoscopy;;   ESOPHAGEAL BANDING  03/23/2019   Procedure: ESOPHAGEAL BANDING;  Surgeon: Wilford Corner, MD;  [facility];  Service: Endoscopy;;   ESOPHAGEAL BANDING  09/20/2019   Procedure: ESOPHAGEAL BANDING;  Surgeon: Wilford Corner, MD;  [facility];  Service: Endoscopy;;   ESOPHAGEAL BANDING N/A 12/21/2019   Procedure: ESOPHAGEAL BANDING;  Surgeon: Wilford Corner, MD;  [facility];  Service: Endoscopy;  Laterality: N/A;   ESOPHAGOGASTRODUODENOSCOPY N/A 03/22/2019   Procedure: ESOPHAGOGASTRODUODENOSCOPY (EGD);  Surgeon: Wilford Corner, MD;  [facility];  Service: Endoscopy;  Laterality: N/A;   ESOPHAGOGASTRODUODENOSCOPY N/A 03/23/2019   Procedure: ESOPHAGOGASTRODUODENOSCOPY (EGD);  Surgeon: Wilford Corner, MD;  [facility];  Service: Endoscopy;  Laterality: N/A;   ESOPHAGOGASTRODUODENOSCOPY (EGD) WITH PROPOFOL N/A 07/23/2019   Procedure: ESOPHAGOGASTRODUODENOSCOPY (EGD) WITH PROPOFOL with possible banding;  Surgeon: Wilford Corner, MD;  [facility];  Service: Endoscopy;  Laterality: N/A;   ESOPHAGOGASTRODUODENOSCOPY (EGD) WITH PROPOFOL N/A 09/20/2019   Procedure: ESOPHAGOGASTRODUODENOSCOPY (EGD) WITH PROPOFOL;  Surgeon: Wilford Corner, MD;  [facility];  Service: Endoscopy;  Laterality: N/A;   ESOPHAGOGASTRODUODENOSCOPY (EGD) WITH PROPOFOL N/A 12/21/2019   Procedure: ESOPHAGOGASTRODUODENOSCOPY (EGD) WITH PROPOFOL with possible banding;  Surgeon: Wilford Corner, MD;  [facility];  Service: Endoscopy;  Laterality: N/A;   HERNIA REPAIR     TUBAL LIGATION  07/25/2000   VENTRAL HERNIA REPAIR N/A 07/06/2020   Procedure: VENTRAL HERNIA REPAIR WITH MESH;  Surgeon: Coralie Keens, MD;  [facility];  Service: General;  Laterality: N/A;    Family History  Problem Relation Age of Onset   Diabetes Father    COPD  Mother    Esophageal cancer Mother    Diabetes Mother    Cancer Mother        esophageal   Hypertension Mother    Breast cancer Maternal Aunt     Social History Reviewed with no changes to be made today.   Outpatient Medications Prior to Visit  Medication Sig Dispense Refill   Accu-Chek Softclix Lancets lancets USE TO CHECK BLOOD SUGAR THREE TIMES DAILY 300 each 1   acetaminophen (TYLENOL) 500 MG tablet Take 500-1,000 mg by mouth every 6 (six) hours as needed for mild pain.     albuterol (VENTOLIN HFA) 108 (90 Base) MCG/ACT inhaler INHALE 2 PUFFS INTO THE LUNGS EVERY 6 HOURS AS NEEDED FOR COUGH 8 each 4   amitriptyline (ELAVIL) 25 MG tablet TAKE 1 TABLET(25 MG) BY MOUTH AT BEDTIME 90 tablet 0   Azelastine-Fluticasone 137-50 MCG/ACT SUSP PLACE 1 SPRAY IN NOSTRIL(S) EVERY 12 HOURS 23 g 0   benzonatate (TESSALON) 200 MG capsule TAKE 1 CAPSULE BY MOUTH 2 TIMES DAILY AS NEEDED FOR COUGH 20 capsule 0   Biotin 10000 MCG TABS Take 10,000 mcg by mouth daily.     Blood Glucose Monitoring Suppl (ACCU-CHEK GUIDE) w/Device KIT Use to check blood sugar three times daily. 1 kit 0   BREO ELLIPTA 100-25 MCG/ACT AEPB TAKE 1 PUFF BY MOUTH EVERY DAY 60 each 3   [employer] IRON 325 (65 Fe) MG tablet TAKE 1 TABLET BY MOUTH TWICE A DAY WITH FOOD 180 tablet 0   dapagliflozin propanediol (FARXIGA) 5 MG TABS tablet Take 1 tablet (5 mg total) by mouth daily before breakfast. 90 tablet 1   glucose blood (ACCU-CHEK GUIDE) test strip Use to check blood sugar three times daily. 100 strip 3   ketoconazole (NIZORAL) 2 % cream Apply 1 application topically daily as needed for irritation.     loratadine (CLARITIN) 10 MG tablet TAKE 1 TABLET BY MOUTH EVERY DAY 90 tablet 1   losartan-hydrochlorothiazide (HYZAAR) 50-12.5 MG tablet Take 1 tablet by mouth daily. 90 tablet 0   Menthol, Topical Analgesic, (ICY HOT MEDICATED SPRAY) 16 % LIQD Apply 1 spray topically daily as needed (pain).     metFORMIN (GLUCOPHAGE) 500 MG tablet Take 1  tablet (500 mg total) by mouth 2 (two) times daily with a meal. 180 tablet 0   Microlet Lancets MISC USE UP TO FOUR TIMES DAILY AS DIRECTED 100 each 11   mupirocin ointment (BACTROBAN) 2 % Apply topically 2 (two) times daily. 22 g 0   omega-3 acid ethyl esters (LOVAZA) 1 g capsule TAKE 1 CAPSULE BY MOUTH TWICE A DAY 180 capsule 0   ondansetron (ZOFRAN-ODT) 4 MG disintegrating tablet Take 1 tablet (4 mg total) by mouth every 8 (eight) hours as needed for vomiting or nausea. 30 tablet 1   pantoprazole (PROTONIX) 40 MG tablet TAKE 1 TABLET BY MOUTH EVERY DAY 90 tablet 1   propranolol (INDERAL) 20 MG tablet TAKE 1 TABLET BY MOUTH TWICE A DAY 180 tablet 0   rosuvastatin (CRESTOR) 10 MG tablet Take 1 tablet (10 mg total) by mouth daily. 90 tablet 3   Semaglutide,0.25 or 0.5MG/DOS, (OZEMPIC, 0.25 OR 0.5 MG/DOSE,) 2 MG/3ML SOPN INJECT 0.5 MG INTO THE SKIN ONCE A WEEK. 3 mL 1   triamcinolone (KENALOG) 0.025 % ointment Apply 1 Application  topically 2 (two) times daily. 30 g 1   vitamin C (ASCORBIC ACID) 500 MG tablet Take 500 mg by mouth 2 (two) times daily.     blood glucose meter kit and supplies KIT Dispense based on patient and insurance preference. Use up to four times daily as directed. (FOR ICD-9 250.00, 250.01). Dispense Relion (Patient not taking: Reported on 08/19/2022) 1 each 0   No facility-administered medications prior to visit.    Allergies  Allergen Reactions   Nsaids     GI BLEED       Objective:    BP 127/81   Pulse 72   Temp 98 F (36.7 C) (Temporal)   Ht 5' 4"  (1.626 m)   Wt 243 lb 3.2 oz (110.3 kg)   SpO2 98%   BMI 41.75 kg/m  Wt Readings from Last 3 Encounters:  08/19/22 243 lb 3.2 oz (110.3 kg)  06/05/22 249 lb 3.2 oz (113 kg)  04/25/22 248 lb 9.6 oz (112.8 kg)    Physical Exam Exam conducted with a chaperone present.  Constitutional:      Appearance: She is well-developed.  HENT:     Head: Normocephalic.  Cardiovascular:     Rate and Rhythm: Normal rate  and regular rhythm.     Heart sounds: Normal heart sounds.  Pulmonary:     Effort: Pulmonary effort is normal.     Breath sounds: Normal breath sounds.  Abdominal:     General: Bowel sounds are normal.     Palpations: Abdomen is soft.     Hernia: There is no hernia in the left inguinal area.  Genitourinary:    Exam position: Lithotomy position.     Labia:        Right: No rash, tenderness, lesion or injury.        Left: No rash, tenderness, lesion or injury.      Vagina: Normal. No signs of injury and foreign body. No vaginal discharge, erythema, tenderness or bleeding.     Cervix: Normal.     Uterus: Not deviated and not enlarged.      Adnexa:        Right: No mass, tenderness or fullness.         Left: No mass, tenderness or fullness.       Rectum: Normal. No external hemorrhoid.  Lymphadenopathy:     Lower Body: No right inguinal adenopathy. No left inguinal adenopathy.  Skin:    General: Skin is warm and dry.  Neurological:     Mental Status: She is alert and oriented to person, place, and time.  Psychiatric:        Behavior: Behavior normal.        Thought Content: Thought content normal.        Judgment: Judgment normal.          Patient has been counseled extensively about nutrition and exercise as well as the importance of adherence with medications and regular follow-up. The patient was given clear instructions to go to ER or return to medical center if symptoms don't improve, worsen or new problems develop. The patient verbalized understanding.   Follow-up: Return for lab appt after november 2nd. See me after february 2nd.   Gildardo Pounds, FNP-BC [facility] and [place], [place]   08/19/2022, 3:30 PM

## 2022-08-20 LAB — CERVICOVAGINAL ANCILLARY ONLY
Bacterial Vaginitis (gardnerella): NEGATIVE
Candida Glabrata: POSITIVE — AB
Candida Vaginitis: NEGATIVE
Chlamydia: NEGATIVE
Comment: NEGATIVE
Comment: NEGATIVE
Comment: NEGATIVE
Comment: NEGATIVE
Comment: NEGATIVE
Comment: NORMAL
Neisseria Gonorrhea: NEGATIVE
Trichomonas: NEGATIVE

## 2022-08-21 ENCOUNTER — Other Ambulatory Visit (HOSPITAL_COMMUNITY): Payer: Self-pay

## 2022-08-21 ENCOUNTER — Other Ambulatory Visit: Payer: Self-pay

## 2022-08-21 ENCOUNTER — Other Ambulatory Visit: Payer: Self-pay | Admitting: Nurse Practitioner

## 2022-08-21 LAB — CYTOLOGY - PAP
Comment: NEGATIVE
Diagnosis: NEGATIVE
High risk HPV: NEGATIVE

## 2022-08-21 MED ORDER — FLUCONAZOLE 150 MG PO TABS
150.0000 mg | ORAL_TABLET | Freq: Once | ORAL | 0 refills | Status: AC
  Filled 2022-08-21: qty 1, 1d supply, fill #0

## 2022-08-28 ENCOUNTER — Other Ambulatory Visit: Payer: Self-pay | Admitting: Family Medicine

## 2022-09-02 ENCOUNTER — Other Ambulatory Visit: Payer: 59

## 2022-09-03 ENCOUNTER — Other Ambulatory Visit: Payer: Self-pay | Admitting: Family Medicine

## 2022-09-03 DIAGNOSIS — K219 Gastro-esophageal reflux disease without esophagitis: Secondary | ICD-10-CM

## 2022-09-06 ENCOUNTER — Other Ambulatory Visit: Payer: 59

## 2022-09-09 ENCOUNTER — Ambulatory Visit: Payer: 59 | Attending: Nurse Practitioner

## 2022-09-09 ENCOUNTER — Other Ambulatory Visit: Payer: Self-pay | Admitting: Nurse Practitioner

## 2022-09-09 DIAGNOSIS — I10 Essential (primary) hypertension: Secondary | ICD-10-CM

## 2022-09-09 DIAGNOSIS — E118 Type 2 diabetes mellitus with unspecified complications: Secondary | ICD-10-CM

## 2022-09-10 LAB — HEMOGLOBIN A1C
Est. average glucose Bld gHb Est-mCnc: 128 mg/dL
Hgb A1c MFr Bld: 6.1 % — ABNORMAL HIGH (ref 4.8–5.6)

## 2022-09-17 ENCOUNTER — Other Ambulatory Visit (HOSPITAL_COMMUNITY): Payer: Self-pay | Admitting: Gastroenterology

## 2022-09-17 DIAGNOSIS — K7460 Unspecified cirrhosis of liver: Secondary | ICD-10-CM

## 2022-09-21 ENCOUNTER — Other Ambulatory Visit: Payer: Self-pay | Admitting: Family Medicine

## 2022-09-24 ENCOUNTER — Encounter (HOSPITAL_COMMUNITY): Payer: Self-pay

## 2022-09-24 ENCOUNTER — Ambulatory Visit (HOSPITAL_COMMUNITY)
Admission: RE | Admit: 2022-09-24 | Discharge: 2022-09-24 | Disposition: A | Discharge status: Home / Self Care | Payer: 59 | Source: Ambulatory Visit | Attending: Gastroenterology | Admitting: Gastroenterology

## 2022-09-24 DIAGNOSIS — K7460 Unspecified cirrhosis of liver: Secondary | ICD-10-CM | POA: Insufficient documentation

## 2022-09-28 ENCOUNTER — Encounter: Payer: Self-pay | Admitting: Nurse Practitioner

## 2022-09-30 ENCOUNTER — Ambulatory Visit (INDEPENDENT_AMBULATORY_CARE_PROVIDER_SITE_OTHER): Payer: 59 | Admitting: Obstetrics and Gynecology

## 2022-09-30 ENCOUNTER — Other Ambulatory Visit (HOSPITAL_COMMUNITY)
Admission: RE | Admit: 2022-09-30 | Discharge: 2022-09-30 | Disposition: A | Discharge status: Home / Self Care | Payer: 59 | Source: Ambulatory Visit | Attending: Obstetrics and Gynecology | Admitting: Obstetrics and Gynecology

## 2022-09-30 ENCOUNTER — Encounter: Payer: Self-pay | Admitting: Obstetrics and Gynecology

## 2022-09-30 VITALS — BP 141/93 | HR 73 | Wt 232.2 lb

## 2022-09-30 DIAGNOSIS — N9089 Other specified noninflammatory disorders of vulva and perineum: Secondary | ICD-10-CM | POA: Insufficient documentation

## 2022-09-30 DIAGNOSIS — B3731 Acute candidiasis of vulva and vagina: Secondary | ICD-10-CM

## 2022-09-30 NOTE — Progress Notes (Signed)
GYNECOLOGY VISIT  Patient name: Angela Castillo MRN [id number]  Date of birth: June 27, 1976 Chief Complaint:   Skin Tag  History:  Angela Castillo is a 46 y.o. [phone] being seen today for concern of vulvar skin tag. Has been there for several months - not sure exactly when it started. Has noticed pain recently, similar to a "pinched hair". Currently sexually active with husband. No discharge from the area that she is aware of. Has had skin tags in other areas of her body. No new discharge that she is aware of.     Past Medical History:  Diagnosis Date   Anemia Dx January 03 2015   Diabetes mellitus without complication ([place])    Elevated liver enzymes    Essential hypertension Dx Apr 2016   Fatty liver    H/O: upper GI bleed 03/2019    variceal bleed    Headache    History of blood transfusion 03/2019   Liver cirrhosis secondary to NASH ([place])    Pneumonia 03/2019   Reflux    Thrombocytopenia (HCC)     Past Surgical History:  Procedure Laterality Date   DILITATION & CURRETTAGE/HYSTROSCOPY WITH HYDROTHERMAL ABLATION N/A 11/20/2021   Procedure: DILATATION & CURETTAGE/HYSTEROSCOPY WITH HYDROTHERMAL ABLATION;  Surgeon: [name], MD;  Location: [facility];  Service: Gynecology;  Laterality: N/A;  rep will be here confirmed on 11/12/21 CS   ESOPHAGEAL BANDING  03/22/2019   Procedure: ESOPHAGEAL BANDING;  Surgeon: Wilford Corner, MD;  [facility];  Service: Endoscopy;;   ESOPHAGEAL BANDING  03/23/2019   Procedure: ESOPHAGEAL BANDING;  Surgeon: Wilford Corner, MD;  [facility];  Service: Endoscopy;;   ESOPHAGEAL BANDING  09/20/2019   Procedure: ESOPHAGEAL BANDING;  Surgeon: Wilford Corner, MD;  [facility];  Service: Endoscopy;;   ESOPHAGEAL BANDING N/A 12/21/2019   Procedure: ESOPHAGEAL BANDING;  Surgeon: Wilford Corner, MD;  [facility];  Service: Endoscopy;  Laterality: N/A;   ESOPHAGOGASTRODUODENOSCOPY N/A  03/22/2019   Procedure: ESOPHAGOGASTRODUODENOSCOPY (EGD);  Surgeon: Wilford Corner, MD;  [facility];  Service: Endoscopy;  Laterality: N/A;   ESOPHAGOGASTRODUODENOSCOPY N/A 03/23/2019   Procedure: ESOPHAGOGASTRODUODENOSCOPY (EGD);  Surgeon: Wilford Corner, MD;  [facility];  Service: Endoscopy;  Laterality: N/A;   ESOPHAGOGASTRODUODENOSCOPY (EGD) WITH PROPOFOL N/A 07/23/2019   Procedure: ESOPHAGOGASTRODUODENOSCOPY (EGD) WITH PROPOFOL with possible banding;  Surgeon: Wilford Corner, MD;  [facility];  Service: Endoscopy;  Laterality: N/A;   ESOPHAGOGASTRODUODENOSCOPY (EGD) WITH PROPOFOL N/A 09/20/2019   Procedure: ESOPHAGOGASTRODUODENOSCOPY (EGD) WITH PROPOFOL;  Surgeon: Wilford Corner, MD;  [facility];  Service: Endoscopy;  Laterality: N/A;   ESOPHAGOGASTRODUODENOSCOPY (EGD) WITH PROPOFOL N/A 12/21/2019   Procedure: ESOPHAGOGASTRODUODENOSCOPY (EGD) WITH PROPOFOL with possible banding;  Surgeon: Wilford Corner, MD;  [facility];  Service: Endoscopy;  Laterality: N/A;   HERNIA REPAIR     TUBAL LIGATION  07/25/2000   VENTRAL HERNIA REPAIR N/A 07/06/2020   Procedure: VENTRAL HERNIA REPAIR WITH MESH;  Surgeon: Coralie Keens, MD;  Location: [facility];  Service: General;  Laterality: N/A;    The following portions of the patient's history were reviewed and updated as appropriate: allergies, current medications, past family history, past medical history, past social history, past surgical history and problem list.   Health Maintenance:   Last pap 10/22023. Results were: NILM w/ HRHPV negative. H/O abnormal pap: no Last mammogram: 08/2022. Results were: normal.   Review of Systems:  Pertinent items are noted in HPI. Comprehensive review of systems  was otherwise negative.   Objective:  Physical Exam BP (!) 141/93   Pulse 73   Wt 232 lb 3.2 oz (105.3 kg)   BMI 39.86 kg/m    Physical Exam Vitals and nursing note reviewed.  Exam conducted with a chaperone present.  Constitutional:      Appearance: Normal appearance.  HENT:     Head: Normocephalic and atraumatic.  Cardiovascular:     Rate and Rhythm: Normal rate and regular rhythm.  Pulmonary:     Effort: Pulmonary effort is normal.     Breath sounds: Normal breath sounds.  Genitourinary:    Exam position: Lithotomy position.       Comments: 1 mm papule, erythematous without surrounding erythema or tenderness Nontender vulva Small dark angioma? (~46m) on left vulva, smooth, even borders nontender and even coloration Skin:    General: Skin is warm and dry.  Neurological:     General: No focal deficit present.     Mental Status: She is alert.  Psychiatric:        Mood and Affect: Mood normal.        Behavior: Behavior normal.        Thought Content: Thought content normal.        Judgment: Judgment normal.    VULVAR BIOPSY NOTE The indications for vulvar biopsy (rule out neoplasia, establish lichen sclerosus diagnosis) were reviewed.   Risks of the biopsy including pain, bleeding, infection, inadequate specimen, scarring and need for additional procedures  were discussed. The patient stated understanding and agreed to undergo procedure today. Consent was signed,  time out performed.  Chaperone was present during entire procedure. The patient's vulva was prepped with alcohol. 1% lidocaine was injected into left lower labia majora deep to the lesion. Betadine was then used to clean the area. A 1-mm punch biopsy was done, biopsy tissue was picked up with sterile forceps and sterile scissors were used to excise the lesion.  Small bleeding was noted and hemostasis was achieved using silver nitrate sticks.  The patient tolerated the procedure well. Post-procedure instructions  (pelvic rest for one week) were given to the patient. The patient is to call with heavy bleeding, fever greater than 100.4, foul smelling vaginal discharge or other concerns. The patient  will be return to clinic in two weeks for discussion of results.     Assessment & Plan:   1. Vulvar lesion Now s/p uncomplicated vulvar biopsy. Given postprocedure instructions and will follow up results - Surgical pathology( [place]  2. Vaginal yeast infection Vaginitis swab collected for vaginal discharge - Cervic[facility]aginal ancillary only( [place]  Routine preventative health maintenance measures emphasized.  CDarliss Cheney MD [facility] [place]

## 2022-10-02 ENCOUNTER — Other Ambulatory Visit: Payer: Self-pay | Admitting: Obstetrics and Gynecology

## 2022-10-02 ENCOUNTER — Encounter: Payer: Self-pay | Admitting: Obstetrics and Gynecology

## 2022-10-02 ENCOUNTER — Other Ambulatory Visit: Payer: Self-pay | Admitting: Gastroenterology

## 2022-10-02 DIAGNOSIS — R1084 Generalized abdominal pain: Secondary | ICD-10-CM

## 2022-10-02 DIAGNOSIS — K7460 Unspecified cirrhosis of liver: Secondary | ICD-10-CM

## 2022-10-02 DIAGNOSIS — B379 Candidiasis, unspecified: Secondary | ICD-10-CM

## 2022-10-02 LAB — CERVICOVAGINAL ANCILLARY ONLY
Bacterial Vaginitis (gardnerella): NEGATIVE
Candida Glabrata: POSITIVE — AB
Candida Vaginitis: NEGATIVE
Comment: NEGATIVE
Comment: NEGATIVE
Comment: NEGATIVE

## 2022-10-02 MED ORDER — BORIC ACID CRYS: 600.0000 mg | CRYSTALS | Freq: Every day | 2 refills | Status: AC

## 2022-10-03 ENCOUNTER — Encounter: Payer: Self-pay | Admitting: Nurse Practitioner

## 2022-10-03 LAB — SURGICAL PATHOLOGY

## 2022-10-08 ENCOUNTER — Ambulatory Visit (HOSPITAL_COMMUNITY)
Admission: RE | Admit: 2022-10-08 | Discharge: 2022-10-08 | Disposition: A | Discharge status: Home / Self Care | Payer: 59 | Source: Ambulatory Visit | Attending: Gastroenterology | Admitting: Gastroenterology

## 2022-10-08 DIAGNOSIS — K7460 Unspecified cirrhosis of liver: Secondary | ICD-10-CM | POA: Insufficient documentation

## 2022-10-08 DIAGNOSIS — R1084 Generalized abdominal pain: Secondary | ICD-10-CM | POA: Diagnosis present

## 2022-10-10 ENCOUNTER — Other Ambulatory Visit: Payer: Self-pay | Admitting: Family Medicine

## 2022-10-10 DIAGNOSIS — D508 Other iron deficiency anemias: Secondary | ICD-10-CM

## 2022-10-10 NOTE — Telephone Encounter (Signed)
Requested medication (s) are due for refill today: Yes  Requested medication (s) are on the active medication list: Yes  Last refill:  07/25/22  Future visit scheduled: Yes  Notes to clinic:  Protocol indicates lab work is needed.    Requested Prescriptions  Pending Prescriptions Disp Refills   ferrous sulfate 325 (65 FE) MG tablet [Pharmacy Med Name: FERROUS SULFATE 325 MG TABLET] 180 tablet 0    Sig: TAKE 1 TABLET BY MOUTH TWICE A DAY WITH FOOD     Endocrinology:  Minerals - Iron Supplementation Failed - 10/10/2022  9:03 AM      Failed - Fe (serum) in normal range and within 360 days    Iron  Date Value Ref Range Status  01/03/2015 11 (L) 42 - 145 ug/dL Final   Saturation Ratios  Date Value Ref Range Status  01/03/2015 2 (L) 20 - 55 % Final         Failed - Ferritin in normal range and within 360 days    Ferritin  Date Value Ref Range Status  01/03/2015 12 10 - 291 ng/mL Final    Comment:    Performed at [facility] - HGB in normal range and within 360 days    Hemoglobin  Date Value Ref Range Status  06/05/2022 14.1 11.1 - 15.9 g/dL Final         Passed - HCT in normal range and within 360 days    Hematocrit  Date Value Ref Range Status  06/05/2022 42.8 34.0 - 46.6 % Final         Passed - RBC in normal range and within 360 days    RBC  Date Value Ref Range Status  06/05/2022 5.04 3.77 - 5.28 x10E6/uL Final  11/21/2021 3.90 3.87 - 5.11 MIL/uL Final         Passed - Valid encounter within last 12 months    Recent Outpatient Visits           1 month ago Encounter for Papanicolaou smear for cervical cancer screening   [place], [place] W, NP   4 months ago Type 2 diabetes mellitus without complication, without long-term current use of insulin ([place])   [place], Vernia Buff, NP   7 months ago Chronic cough   [place],  Vernia Buff, NP   8 months ago Chronic cough   [place], [place] W, NP   10 months ago Type 2 diabetes mellitus without complication, without long-term current use of insulin [facility])   [place], Vernia Buff, NP       Future Appointments             In 1 month Laurin Coder, MD [facility]   In 1 month Gildardo Pounds, NP [place]

## 2022-10-11 ENCOUNTER — Other Ambulatory Visit: Payer: Self-pay | Admitting: Nurse Practitioner

## 2022-10-11 DIAGNOSIS — R519 Headache, unspecified: Secondary | ICD-10-CM

## 2022-10-14 ENCOUNTER — Encounter: Payer: Self-pay | Admitting: *Deleted

## 2022-10-21 ENCOUNTER — Other Ambulatory Visit: Payer: Self-pay | Admitting: Pulmonary Disease

## 2022-10-25 ENCOUNTER — Other Ambulatory Visit: Payer: Self-pay | Admitting: Family Medicine

## 2022-10-25 DIAGNOSIS — E118 Type 2 diabetes mellitus with unspecified complications: Secondary | ICD-10-CM

## 2022-10-25 NOTE — Telephone Encounter (Signed)
Requested Prescriptions  Pending Prescriptions Disp Refills   omega-3 acid ethyl esters (LOVAZA) 1 g capsule [Pharmacy Med Name: OMEGA-3 ETHYL ESTERS 1 GM CAP] 180 capsule 0    Sig: TAKE 1 CAPSULE BY MOUTH TWICE A DAY     Endocrinology:  Nutritional Agents - omega-3 acid ethyl esters Failed - 10/25/2022  2:25 PM      Failed - Lipid Panel in normal range within the last 12 months    Cholesterol, Total  Date Value Ref Range Status  07/26/2022 125 100 - 199 mg/dL Final   LDL Chol Calc (NIH)  Date Value Ref Range Status  07/26/2022 73 0 - 99 mg/dL Final   HDL  Date Value Ref Range Status  07/26/2022 34 (L) >39 mg/dL Final   Triglycerides  Date Value Ref Range Status  07/26/2022 95 0 - 149 mg/dL Final         Passed - Valid encounter within last 12 months    Recent Outpatient Visits           2 months ago Encounter for Papanicolaou smear for cervical cancer screening   [place], Vernia Buff, NP   4 months ago Type 2 diabetes mellitus without complication, without long-term current use of insulin ([place])   [place], Vernia Buff, NP   8 months ago Chronic cough   [place], Vernia Buff, NP   8 months ago Chronic cough   [place], Vernia Buff, NP   10 months ago Type 2 diabetes mellitus without complication, without long-term current use of insulin ([place])   [place], Vernia Buff, NP       Future Appointments             In 2 weeks Laurin Coder, MD [facility]   In 1 month Gildardo Pounds, NP [place]             losartan-hydrochlorothiazide (HYZAAR) 50-12.5 MG tablet [Pharmacy Med Name: LOSARTAN-HCTZ 50-12.5 MG TAB] 90 tablet 0    Sig: TAKE 1 TABLET BY MOUTH EVERY DAY     Cardiovascular: ARB + Diuretic Combos Failed - 10/25/2022  2:25 PM       Failed - Last BP in normal range    BP Readings from Last 1 Encounters:  09/30/22 (!) 141/93         Passed - K in normal range and within 180 days    Potassium  Date Value Ref Range Status  07/26/2022 4.1 3.5 - 5.2 mmol/L Final         Passed - Na in normal range and within 180 days    Sodium  Date Value Ref Range Status  07/26/2022 141 134 - 144 mmol/L Final         Passed - Cr in normal range and within 180 days    Creat  Date Value Ref Range Status  06/21/2016 0.68 0.50 - 1.10 mg/dL Final   Creatinine, Ser  Date Value Ref Range Status  07/26/2022 0.65 0.57 - 1.00 mg/dL Final         Passed - eGFR is 10 or above and within 180 days    GFR, Est African American  Date Value Ref Range Status  06/21/2016 >89 >=60 mL/min Final   GFR calc Af Amer  Date Value Ref  Range Status  11/07/2020 125 >59 mL/min/1.73 Final    Comment:    **In accordance with recommendations from the NKF-ASN Task force,**   [facility] is in the process of updating its eGFR calculation to the   2021 CKD-EPI creatinine equation that estimates kidney function   without a race variable.    GFR, Est Non African American  Date Value Ref Range Status  06/21/2016 >89 >=60 mL/min Final   GFR, Estimated  Date Value Ref Range Status  11/20/2021 >60 >60 mL/min Final    Comment:    (NOTE) Calculated using the CKD-EPI Creatinine Equation (2021)    eGFR  Date Value Ref Range Status  07/26/2022 111 >59 mL/min/1.73 Final         Passed - Patient is not pregnant      Passed - Valid encounter within last 6 months    Recent Outpatient Visits           2 months ago Encounter for Papanicolaou smear for cervical cancer screening   [place], [place] W, NP   4 months ago Type 2 diabetes mellitus without complication, without long-term current use of insulin ([place])   [place], Vernia Buff, NP   8 months ago Chronic cough   [place], Vernia Buff, NP   8 months ago Chronic cough   [place], [place] W, NP   10 months ago Type 2 diabetes mellitus without complication, without long-term current use of insulin ([place])   [place], Vernia Buff, NP       Future Appointments             In 2 weeks Laurin Coder, MD [facility]   In 1 month Gildardo Pounds, NP [place]             metFORMIN (GLUCOPHAGE) 500 MG tablet [Pharmacy Med Name: METFORMIN HCL 500 MG TABLET] 180 tablet 0    Sig: TAKE 1 TABLET BY MOUTH 2 TIMES DAILY WITH A MEAL.     Endocrinology:  Diabetes - Biguanides Failed - 10/25/2022  2:25 PM      Failed - B12 Level in normal range and within 720 days    Vitamin B-12  Date Value Ref Range Status  01/03/2015 427 211 - 911 pg/mL Final    Comment:    Performed at [facility] in normal range and within 360 days    Creat  Date Value Ref Range Status  06/21/2016 0.68 0.50 - 1.10 mg/dL Final   Creatinine, Ser  Date Value Ref Range Status  07/26/2022 0.65 0.57 - 1.00 mg/dL Final         Passed - HBA1C is between 0 and 7.9 and within 180 days    HbA1c, POC (controlled diabetic range)  Date Value Ref Range Status  06/05/2022 8.2 (A) 0.0 - 7.0 % Final   Hgb A1c MFr Bld  Date Value Ref Range Status  09/09/2022 6.1 (H) 4.8 - 5.6 % Final    Comment:             Prediabetes: 5.7 - 6.4          Diabetes: >6.4          Glycemic control for adults with diabetes: <  7.0          Passed - eGFR in normal range and within 360 days    GFR, Est African American  Date Value Ref Range Status  06/21/2016 >89 >=60 mL/min Final   GFR calc Af Amer  Date Value Ref Range Status  11/07/2020 125 >59 mL/min/1.73 Final    Comment:    **In accordance with recommendations from the NKF-ASN Task force,**   [facility] is in the  process of updating its eGFR calculation to the   2021 CKD-EPI creatinine equation that estimates kidney function   without a race variable.    GFR, Est Non African American  Date Value Ref Range Status  06/21/2016 >89 >=60 mL/min Final   GFR, Estimated  Date Value Ref Range Status  11/20/2021 >60 >60 mL/min Final    Comment:    (NOTE) Calculated using the CKD-EPI Creatinine Equation (2021)    eGFR  Date Value Ref Range Status  07/26/2022 111 >59 mL/min/1.73 Final         Passed - Valid encounter within last 6 months    Recent Outpatient Visits           2 months ago Encounter for Papanicolaou smear for cervical cancer screening   [place], [place] W, NP   4 months ago Type 2 diabetes mellitus without complication, without long-term current use of insulin ([place])   [place], Vernia Buff, NP   8 months ago Chronic cough   [place], Vernia Buff, NP   8 months ago Chronic cough   [place], [place] W, NP   10 months ago Type 2 diabetes mellitus without complication, without long-term current use of insulin ([place])   [place], Vernia Buff, NP       Future Appointments             In 2 weeks Laurin Coder, MD [facility]   In 1 month Gildardo Pounds, NP [place] - CBC within normal limits and completed in the last 12 months    WBC  Date Value Ref Range Status  06/05/2022 5.2 3.4 - 10.8 x10E3/uL Final  11/21/2021 8.6 4.0 - 10.5 K/uL Final   RBC  Date Value Ref Range Status  06/05/2022 5.04 3.77 - 5.28 x10E6/uL Final  11/21/2021 3.90 3.87 - 5.11 MIL/uL Final   Hemoglobin  Date Value Ref Range Status  06/05/2022 14.1 11.1 - 15.9 g/dL Final   Hematocrit  Date Value Ref Range Status  06/05/2022 42.8 34.0 - 46.6 %  Final   MCHC  Date Value Ref Range Status  06/05/2022 32.9 31.5 - 35.7 g/dL Final  11/21/2021 30.9 30.0 - 36.0 g/dL Final   [facility]  Date Value Ref Range Status  06/05/2022 28.0 26.6 - 33.0 pg Final  11/21/2021 27.7 26.0 - 34.0 pg Final   MCV  Date Value Ref Range Status  06/05/2022 85 79 - 97 fL Final   No results found for: "PLTCOUNTKUC", "LABPLAT", "POCPLA" RDW  Date Value Ref Range Status  06/05/2022 14.5 11.7 - 15.4 % Final

## 2022-11-06 ENCOUNTER — Encounter: Payer: Self-pay | Admitting: Nurse Practitioner

## 2022-11-11 ENCOUNTER — Other Ambulatory Visit: Payer: Self-pay | Admitting: Nurse Practitioner

## 2022-11-11 DIAGNOSIS — G8929 Other chronic pain: Secondary | ICD-10-CM

## 2022-11-13 ENCOUNTER — Ambulatory Visit (INDEPENDENT_AMBULATORY_CARE_PROVIDER_SITE_OTHER): Payer: 59 | Admitting: Pulmonary Disease

## 2022-11-13 ENCOUNTER — Encounter: Payer: Self-pay | Admitting: Pulmonary Disease

## 2022-11-13 VITALS — BP 118/72 | HR 64 | Ht 64.0 in | Wt 232.0 lb

## 2022-11-13 DIAGNOSIS — R053 Chronic cough: Secondary | ICD-10-CM | POA: Diagnosis not present

## 2022-11-13 NOTE — Progress Notes (Signed)
Angela Castillo    [id number]    [birth year]  Primary Care Physician:[place], Vernia Buff, NP  Referring Physician: Gildardo Pounds, NP [name],  [name] [address]  Chief complaint:   Protracted cough  HPI:  Cough is overall better Memory Dance seems to be helping Rarely needs albuterol  Never smoker No underlying history of asthma or COPD Memory Dance seems to be helping  She does have underlying history of hypertension, diabetes, history of chronic headaches  Never smoked No significant secondhand smoking history  Worked in [employer], works security  No family history of asthma  Outpatient Encounter Medications as of 11/13/2022  Medication Sig   ACCU-CHEK GUIDE test strip USE TO CHECK BLOOD SUGAR THREE TIMES DAILY   Accu-Chek Softclix Lancets lancets USE TO CHECK BLOOD SUGAR THREE TIMES DAILY   acetaminophen (TYLENOL) 500 MG tablet Take 500-1,000 mg by mouth every 6 (six) hours as needed for mild pain.   albuterol (VENTOLIN HFA) 108 (90 Base) MCG/ACT inhaler INHALE 2 PUFFS INTO THE LUNGS EVERY 6 HOURS AS NEEDED FOR COUGH   amitriptyline (ELAVIL) 25 MG tablet TAKE 1 TABLET BY MOUTH AT BEDTIME   Azelastine-Fluticasone 137-50 MCG/ACT SUSP PLACE 1 SPRAY IN NOSTRIL(S) EVERY 12 HOURS   Biotin 10000 MCG TABS Take 10,000 mcg by mouth daily.   blood glucose meter kit and supplies KIT Dispense based on patient and insurance preference. Use up to four times daily as directed. (FOR ICD-9 250.00, 250.01). Dispense Relion   Blood Glucose Monitoring Suppl (ACCU-CHEK GUIDE) w/Device KIT Use to check blood sugar three times daily.   BREO ELLIPTA 100-25 MCG/ACT AEPB INHALE 1 PUFF BY MOUTH EVERY DAY   dapagliflozin propanediol (FARXIGA) 5 MG TABS tablet Take 1 tablet (5 mg total) by mouth daily before breakfast.   ferrous sulfate 325 (65 FE) MG tablet TAKE 1 TABLET BY MOUTH TWICE A DAY WITH FOOD   ketoconazole (NIZORAL) 2 % cream Apply 1 application  topically daily as needed for irritation.   losartan-hydrochlorothiazide (HYZAAR) 50-12.5 MG tablet TAKE 1 TABLET BY MOUTH EVERY DAY   Menthol, Topical Analgesic, (ICY HOT MEDICATED SPRAY) 16 % LIQD Apply 1 spray topically daily as needed (pain).   metFORMIN (GLUCOPHAGE) 500 MG tablet TAKE 1 TABLET BY MOUTH 2 TIMES DAILY WITH A MEAL.   Microlet Lancets MISC USE UP TO FOUR TIMES DAILY AS DIRECTED   mupirocin ointment (BACTROBAN) 2 % Apply topically 2 (two) times daily.   omega-3 acid ethyl esters (LOVAZA) 1 g capsule TAKE 1 CAPSULE BY MOUTH TWICE A DAY   ondansetron (ZOFRAN-ODT) 4 MG disintegrating tablet Take 1 tablet (4 mg total) by mouth every 8 (eight) hours as needed for vomiting or nausea.   pantoprazole (PROTONIX) 40 MG tablet TAKE 1 TABLET BY MOUTH EVERY DAY   propranolol (INDERAL) 20 MG tablet TAKE 1 TABLET BY MOUTH TWICE A DAY   rosuvastatin (CRESTOR) 10 MG tablet Take 1 tablet (10 mg total) by mouth daily.   Semaglutide,0.25 or 0.5MG /DOS, (OZEMPIC, 0.25 OR 0.5 MG/DOSE,) 2 MG/3ML SOPN INJECT 0.5 MG INTO THE SKIN ONCE A WEEK.   triamcinolone (KENALOG) 0.025 % ointment Apply 1 Application topically 2 (two) times daily.   vitamin C (ASCORBIC ACID) 500 MG tablet Take 500 mg by mouth 2 (two) times daily.   benzonatate (TESSALON) 200 MG capsule TAKE 1 CAPSULE BY MOUTH 2 TIMES DAILY AS NEEDED FOR COUGH (Patient not taking: Reported on  11/13/2022)   loratadine (CLARITIN) 10 MG tablet TAKE 1 TABLET BY MOUTH EVERY DAY (Patient not taking: Reported on 11/13/2022)   No facility-administered encounter medications on file as of 11/13/2022.    Allergies as of 11/13/2022 - Review Complete 11/13/2022  Allergen Reaction Noted   Nsaids  06/20/2019    Past Medical History:  Diagnosis Date   Anemia Dx January 03 2015   Diabetes mellitus without complication ([place])    Elevated liver enzymes    Essential hypertension Dx Apr 2016   Fatty liver    H/O: upper GI bleed 03/2019    variceal bleed    Headache     History of blood transfusion 03/2019   Liver cirrhosis secondary to NASH ([place])    Pneumonia 03/2019   Reflux    Thrombocytopenia (H[facility])     Past Surgical History:  Procedure Laterality Date   DILITATION & CURRETTAGE/HYSTROSCOPY WITH HYDROTHERMAL ABLATION N/A 11/20/2021   Procedure: DILATATION & CURETTAGE/HYSTEROSCOPY WITH HYDROTHERMAL ABLATION;  Surgeon: [name], MD;  Location: [facility];  Service: Gynecology;  Laterality: N/A;  rep will be here confirmed on 11/12/21 CS   ESOPHAGEAL BANDING  03/22/2019   Procedure: ESOPHAGEAL BANDING;  Surgeon: Wilford Corner, MD;  [facility];  Service: Endoscopy;;   ESOPHAGEAL BANDING  03/23/2019   Procedure: ESOPHAGEAL BANDING;  Surgeon: Wilford Corner, MD;  [facility];  Service: Endoscopy;;   ESOPHAGEAL BANDING  09/20/2019   Procedure: ESOPHAGEAL BANDING;  Surgeon: Wilford Corner, MD;  [facility];  Service: Endoscopy;;   ESOPHAGEAL BANDING N/A 12/21/2019   Procedure: ESOPHAGEAL BANDING;  Surgeon: Wilford Corner, MD;  [facility];  Service: Endoscopy;  Laterality: N/A;   ESOPHAGOGASTRODUODENOSCOPY N/A 03/22/2019   Procedure: ESOPHAGOGASTRODUODENOSCOPY (EGD);  Surgeon: Wilford Corner, MD;  [facility];  Service: Endoscopy;  Laterality: N/A;   ESOPHAGOGASTRODUODENOSCOPY N/A 03/23/2019   Procedure: ESOPHAGOGASTRODUODENOSCOPY (EGD);  Surgeon: Wilford Corner, MD;  [facility];  Service: Endoscopy;  Laterality: N/A;   ESOPHAGOGASTRODUODENOSCOPY (EGD) WITH PROPOFOL N/A 07/23/2019   Procedure: ESOPHAGOGASTRODUODENOSCOPY (EGD) WITH PROPOFOL with possible banding;  Surgeon: Wilford Corner, MD;  [facility];  Service: Endoscopy;  Laterality: N/A;   ESOPHAGOGASTRODUODENOSCOPY (EGD) WITH PROPOFOL N/A 09/20/2019   Procedure: ESOPHAGOGASTRODUODENOSCOPY (EGD) WITH PROPOFOL;  Surgeon: Wilford Corner, MD;  [facility];  Service: Endoscopy;  Laterality:  N/A;   ESOPHAGOGASTRODUODENOSCOPY (EGD) WITH PROPOFOL N/A 12/21/2019   Procedure: ESOPHAGOGASTRODUODENOSCOPY (EGD) WITH PROPOFOL with possible banding;  Surgeon: Wilford Corner, MD;  [facility];  Service: Endoscopy;  Laterality: N/A;   HERNIA REPAIR     TUBAL LIGATION  07/25/2000   VENTRAL HERNIA REPAIR N/A 07/06/2020   Procedure: VENTRAL HERNIA REPAIR WITH MESH;  Surgeon: Coralie Keens, MD;  Location: [facility];  Service: General;  Laterality: N/A;    Family History  Problem Relation Age of Onset   Diabetes Father    COPD Mother    Esophageal cancer Mother    Diabetes Mother    Cancer Mother        esophageal   Hypertension Mother    Breast cancer Maternal Aunt     Social History   Socioeconomic History   Marital status: Married    Spouse name: Not on file   Number of children: Not on file   Years of education: Not on file   Highest education level: Not on file  Occupational History    Comment: security guard at [employer]  Tobacco Use   Smoking  status: Never   Smokeless tobacco: Never  Vaping Use   Vaping Use: Never used  Substance and Sexual Activity   Alcohol use: Yes    Comment: rarely-last alcohol drank in 08/2018   Drug use: No   Sexual activity: Yes  Other Topics Concern   Not on file  Social History Narrative   Not on file   Social Determinants of Health   Financial Resource Strain: Not on file  Food Insecurity: No Food Insecurity (11/01/2021)   Hunger Vital Sign    Worried About Running Out of Food in the Last Year: Never true    Ran Out of Food in the Last Year: Never true  Transportation Needs: No Transportation Needs (11/01/2021)   PRAPARE - Administrator, Civil Service (Medical): No    Lack of Transportation (Non-Medical): No  Physical Activity: Not on file  Stress: Not on file  Social Connections: Not on file  Intimate Partner Violence: Not on file    Review of Systems  Respiratory:  Positive for cough.  Negative for shortness of breath.     Vitals:   11/13/22 1519  BP: 118/72  Pulse: 64  SpO2: 98%      Physical Exam Constitutional:      Appearance: She is obese.  HENT:     Head: Normocephalic.     Right Ear: Tympanic membrane normal.     Nose: Nose normal.     Mouth/Throat:     Mouth: Mucous membranes are moist.  Eyes:     General: No scleral icterus. Cardiovascular:     Rate and Rhythm: Normal rate and regular rhythm.     Heart sounds: No murmur heard.    No friction rub.  Pulmonary:     Effort: No respiratory distress.     Breath sounds: No stridor. No wheezing or rhonchi.  Musculoskeletal:     Cervical back: No rigidity or tenderness.  Neurological:     Mental Status: She is alert.  Psychiatric:        Mood and Affect: Mood normal.     Data Reviewed: No Recent chest x-ray  PFT with combined mild obstructive disease with mild restriction, normal diffusing capacity  Assessment:  Protracted cough -Has improved significantly -Continues to use Breo -Rarely needs albuterol has been stable  Possible airway hyperactivity -  Abnormal PFT showing combined obstruction and restriction    Plan/Recommendations: Discussed holding off on Breo as she is doing very well at present To still have albuterol available to be used as needed  Follow-up in 6 months  Encouraged to call with significant concerns   Virl Diamond MD [facility] 11/13/2022, 3:34 PM  CC: Claiborne Rigg, NP

## 2022-11-13 NOTE — Patient Instructions (Signed)
I will see you back in 6 months  You can hold off on Breo for few days to see how you feel  Resume it if you feel your breathing was much better with Breo, continue to hold off as long as your breathing remains stable  Call with significant concerns

## 2022-11-14 ENCOUNTER — Ambulatory Visit (HOSPITAL_COMMUNITY)
Admission: RE | Admit: 2022-11-14 | Discharge: 2022-11-14 | Disposition: A | Discharge status: Home / Self Care | Payer: 59 | Source: Ambulatory Visit | Attending: Nurse Practitioner | Admitting: Nurse Practitioner

## 2022-11-14 DIAGNOSIS — M79641 Pain in right hand: Secondary | ICD-10-CM | POA: Insufficient documentation

## 2022-11-14 DIAGNOSIS — G8929 Other chronic pain: Secondary | ICD-10-CM

## 2022-11-16 ENCOUNTER — Other Ambulatory Visit: Payer: Self-pay | Admitting: Family Medicine

## 2022-11-16 DIAGNOSIS — K219 Gastro-esophageal reflux disease without esophagitis: Secondary | ICD-10-CM

## 2022-11-18 NOTE — Telephone Encounter (Signed)
Requested Prescriptions  Pending Prescriptions Disp Refills   OZEMPIC, 0.25 OR 0.5 MG/DOSE, 2 MG/3ML SOPN [Pharmacy Med Name: OZEMPIC 0.25-0.5 MG/DOSE PEN] 9 mL 1    Sig: INJECT 0.5 MG INTO THE SKIN ONCE A WEEK.     Endocrinology:  Diabetes - GLP-1 Receptor Agonists - semaglutide Failed - 11/16/2022  7:32 AM      Failed - HBA1C in normal range and within 180 days    HbA1c, POC (controlled diabetic range)  Date Value Ref Range Status  06/05/2022 8.2 (A) 0.0 - 7.0 % Final   Hgb A1c MFr Bld  Date Value Ref Range Status  09/09/2022 6.1 (H) 4.8 - 5.6 % Final    Comment:             Prediabetes: 5.7 - 6.4          Diabetes: >6.4          Glycemic control for adults with diabetes: <7.0          Passed - Cr in normal range and within 360 days    Creat  Date Value Ref Range Status  06/21/2016 0.68 0.50 - 1.10 mg/dL Final   Creatinine, Ser  Date Value Ref Range Status  07/26/2022 0.65 0.57 - 1.00 mg/dL Final         Passed - Valid encounter within last 6 months    Recent Outpatient Visits           3 months ago Encounter for Papanicolaou smear for cervical cancer screening   [place], [place] W, NP   5 months ago Type 2 diabetes mellitus without complication, without long-term current use of insulin ([place])   [place], Vernia Buff, NP   9 months ago Chronic cough   [place], Vernia Buff, NP   9 months ago Chronic cough   [place], [place] W, NP   11 months ago Type 2 diabetes mellitus without complication, without long-term current use of insulin [facility])   [place], Vernia Buff, NP       Future Appointments             In 2 weeks Gildardo Pounds, NP [place]             propranolol (INDERAL) 20 MG tablet [Pharmacy Med Name: PROPRANOLOL 20 MG TABLET] 180  tablet 0    Sig: TAKE 1 TABLET BY MOUTH TWICE A DAY     Cardiovascular:  Beta Blockers Passed - 11/16/2022  7:32 AM      Passed - Last BP in normal range    BP Readings from Last 1 Encounters:  11/13/22 118/72         Passed - Last Heart Rate in normal range    Pulse Readings from Last 1 Encounters:  11/13/22 64         Passed - Valid encounter within last 6 months    Recent Outpatient Visits           3 months ago Encounter for Papanicolaou smear for cervical cancer screening   [place], [place] W, NP   5 months ago Type 2 diabetes mellitus without complication, without long-term current use of insulin [facility])   [place], Vernia Buff, NP   9  months ago Chronic cough   [place], Vernia Buff, NP   9 months ago Chronic cough   [place], [place] W, NP   11 months ago Type 2 diabetes mellitus without complication, without long-term current use of insulin [facility])   [place], Vernia Buff, NP       Future Appointments             In 2 weeks Gildardo Pounds, NP [place]

## 2022-11-22 ENCOUNTER — Encounter: Payer: Self-pay | Admitting: Nurse Practitioner

## 2022-11-24 ENCOUNTER — Other Ambulatory Visit: Payer: Self-pay | Admitting: Nurse Practitioner

## 2022-11-24 MED ORDER — OMEGA-3-ACID ETHYL ESTERS 1 G PO CAPS
1.0000 | ORAL_CAPSULE | Freq: Two times a day (BID) | ORAL | 1 refills | Status: DC
  Filled 2022-11-24 – 2022-11-25 (×2): qty 60, 30d supply, fill #0
  Filled 2022-12-19: qty 60, 30d supply, fill #1

## 2022-11-25 ENCOUNTER — Other Ambulatory Visit (HOSPITAL_COMMUNITY): Payer: Self-pay

## 2022-11-25 ENCOUNTER — Other Ambulatory Visit: Payer: Self-pay

## 2022-12-02 LAB — HM DIABETES EYE EXAM

## 2022-12-04 ENCOUNTER — Other Ambulatory Visit: Payer: Self-pay | Admitting: Family Medicine

## 2022-12-04 ENCOUNTER — Other Ambulatory Visit: Payer: Self-pay | Admitting: Nurse Practitioner

## 2022-12-04 DIAGNOSIS — E119 Type 2 diabetes mellitus without complications: Secondary | ICD-10-CM

## 2022-12-06 ENCOUNTER — Encounter: Payer: Self-pay | Admitting: Nurse Practitioner

## 2022-12-06 ENCOUNTER — Ambulatory Visit: Payer: 59 | Attending: Nurse Practitioner | Admitting: Nurse Practitioner

## 2022-12-06 VITALS — BP 109/73 | HR 71 | Ht 64.0 in | Wt 230.8 lb

## 2022-12-06 DIAGNOSIS — E118 Type 2 diabetes mellitus with unspecified complications: Secondary | ICD-10-CM

## 2022-12-06 DIAGNOSIS — M533 Sacrococcygeal disorders, not elsewhere classified: Secondary | ICD-10-CM | POA: Diagnosis not present

## 2022-12-06 DIAGNOSIS — M542 Cervicalgia: Secondary | ICD-10-CM | POA: Diagnosis not present

## 2022-12-06 DIAGNOSIS — D696 Thrombocytopenia, unspecified: Secondary | ICD-10-CM | POA: Diagnosis not present

## 2022-12-06 LAB — GLUCOSE, POCT (MANUAL RESULT ENTRY): POC Glucose: 95 mg/dl (ref 70–99)

## 2022-12-06 LAB — POCT GLYCOSYLATED HEMOGLOBIN (HGB A1C): HbA1c, POC (controlled diabetic range): 5.9 % (ref 0.0–7.0)

## 2022-12-06 MED ORDER — DICLOFENAC SODIUM 1 % EX GEL: 2.0000 g | Freq: Four times a day (QID) | CUTANEOUS | 1 refills | Status: AC

## 2022-12-06 MED ORDER — METHOCARBAMOL 500 MG PO TABS: 500.0000 mg | ORAL_TABLET | Freq: Three times a day (TID) | ORAL | 0 refills | Status: DC | PRN

## 2022-12-06 NOTE — Progress Notes (Signed)
Assessment & Plan:  Angela Castillo was seen today for hypertension.  Diagnoses and all orders for this visit:  Type 2 diabetes mellitus with unspecified complications (HCC) -     POCT glycosylated hemoglobin (Hb A1C) -     POCT glucose (manual entry) -     MR Lumbar Spine Wo Contrast; Future -     CMP14+EGFR -     Microalbumin / creatinine urine ratio Continue blood sugar control as discussed in office today, low carbohydrate diet, and regular physical exercise as tolerated, 150 minutes per week (30 min each day, 5 days per week, or 50 min 3 days per week). Keep blood sugar logs with fasting goal of 90-130 mg/dl, post prandial (after you eat) less than 180.  For Hypoglycemia: BS <60 and Hyperglycemia BS >400; contact the clinic ASAP. Annual eye exams and foot exams are recommended.   Sacral back pain -     MR Lumbar Spine Wo Contrast; Future Work on losing weight to help reduce back pain. May alternate with heat and ice application for pain relief. May also alternate with acetaminophen as prescribed for back pain. Other alternatives include massage, acupuncture and water aerobics.  You must stay active and avoid a sedentary lifestyle.    Thrombocytopenia (HCC) -     CBC with Differential  Cervical pain (neck) -     diclofenac Sodium (VOLTAREN) 1 % GEL; Apply 2 g topically 4 (four) times daily. -     methocarbamol (ROBAXIN) 500 MG tablet; Take 1 tablet (500 mg total) by mouth every 8 (eight) hours as needed for muscle spasms.    Patient has been counseled on age-appropriate routine health concerns for screening and prevention. These are reviewed and up-to-date. Referrals have been placed accordingly. Immunizations are up-to-date or declined.    Subjective:   Chief Complaint  Patient presents with   Hypertension   HPI Angela Castillo 47 y.o. female presents to office today for follow up to DM   DM 2 Well controlled with ozempic 0.25 mg weekly, farxiga 5 mg daily and  metformin 500 mg BID.  Lab Results  Component Value Date   HGBA1C 5.9 12/06/2022    Lab Results  Component Value Date   HGBA1C 6.1 (H) 09/09/2022    Back Pain/sacral and lumbar  Symptoms have been present for several years and include pain and stiffness in the lumbar and sacral spine.. Initial inciting event: none. Alleviating factors identifiable by patient are none. Exacerbating factors identifiable by patient are bending backwards, bending forwards, bending sideways, recumbency, sitting, standing, and walking. Treatments so far initiated: NSAIDS, meloxicam, robaxin,  She also endorses cervical neck pain radiating to her shoulders.   Review of Systems  Constitutional:  Negative for fever, malaise/fatigue and weight loss.  HENT: Negative.  Negative for nosebleeds.   Eyes: Negative.  Negative for blurred vision, double vision and photophobia.  Respiratory: Negative.  Negative for cough and shortness of breath.   Cardiovascular: Negative.  Negative for chest pain, palpitations and leg swelling.  Gastrointestinal: Negative.  Negative for heartburn, nausea and vomiting.  Musculoskeletal:  Positive for back pain, joint pain, myalgias and neck pain.  Neurological: Negative.  Negative for dizziness, focal weakness, seizures and headaches.  Psychiatric/Behavioral: Negative.  Negative for suicidal ideas.     Past Medical History:  Diagnosis Date   Anemia Dx January 03 2015   Diabetes mellitus without complication ([place])    Elevated liver enzymes    Essential hypertension Dx Apr 2016  Fatty liver    H/O: upper GI bleed 03/2019    variceal bleed    Headache    History of blood transfusion 03/2019   Liver cirrhosis secondary to NASH ([place])    Pneumonia 03/2019   Reflux    Thrombocytopenia (HCC)     Past Surgical History:  Procedure Laterality Date   DILITATION & CURRETTAGE/HYSTROSCOPY WITH HYDROTHERMAL ABLATION N/A 11/20/2021   Procedure: DILATATION & CURETTAGE/HYSTEROSCOPY WITH  HYDROTHERMAL ABLATION;  Surgeon: [name], MD;  Location: [place];  Service: Gynecology;  Laterality: N/A;  rep will be here confirmed on 11/12/21 CS   ESOPHAGEAL BANDING  03/22/2019   Procedure: ESOPHAGEAL BANDING;  Surgeon: Wilford Corner, MD;  [facility];  Service: Endoscopy;;   ESOPHAGEAL BANDING  03/23/2019   Procedure: ESOPHAGEAL BANDING;  Surgeon: Wilford Corner, MD;  [facility];  Service: Endoscopy;;   ESOPHAGEAL BANDING  09/20/2019   Procedure: ESOPHAGEAL BANDING;  Surgeon: Wilford Corner, MD;  [facility];  Service: Endoscopy;;   ESOPHAGEAL BANDING N/A 12/21/2019   Procedure: ESOPHAGEAL BANDING;  Surgeon: Wilford Corner, MD;  [facility];  Service: Endoscopy;  Laterality: N/A;   ESOPHAGOGASTRODUODENOSCOPY N/A 03/22/2019   Procedure: ESOPHAGOGASTRODUODENOSCOPY (EGD);  Surgeon: Wilford Corner, MD;  [facility];  Service: Endoscopy;  Laterality: N/A;   ESOPHAGOGASTRODUODENOSCOPY N/A 03/23/2019   Procedure: ESOPHAGOGASTRODUODENOSCOPY (EGD);  Surgeon: Wilford Corner, MD;  [facility];  Service: Endoscopy;  Laterality: N/A;   ESOPHAGOGASTRODUODENOSCOPY (EGD) WITH PROPOFOL N/A 07/23/2019   Procedure: ESOPHAGOGASTRODUODENOSCOPY (EGD) WITH PROPOFOL with possible banding;  Surgeon: Wilford Corner, MD;  [facility];  Service: Endoscopy;  Laterality: N/A;   ESOPHAGOGASTRODUODENOSCOPY (EGD) WITH PROPOFOL N/A 09/20/2019   Procedure: ESOPHAGOGASTRODUODENOSCOPY (EGD) WITH PROPOFOL;  Surgeon: Wilford Corner, MD;  [facility];  Service: Endoscopy;  Laterality: N/A;   ESOPHAGOGASTRODUODENOSCOPY (EGD) WITH PROPOFOL N/A 12/21/2019   Procedure: ESOPHAGOGASTRODUODENOSCOPY (EGD) WITH PROPOFOL with possible banding;  Surgeon: Wilford Corner, MD;  [facility];  Service: Endoscopy;  Laterality: N/A;   HERNIA REPAIR     TUBAL LIGATION  07/25/2000   VENTRAL HERNIA REPAIR N/A  07/06/2020   Procedure: VENTRAL HERNIA REPAIR WITH MESH;  Surgeon: Coralie Keens, MD;  Location: [place];  Service: General;  Laterality: N/A;    Family History  Problem Relation Age of Onset   Diabetes Father    COPD Mother    Esophageal cancer Mother    Diabetes Mother    Cancer Mother        esophageal   Hypertension Mother    Breast cancer Maternal Aunt     Social History Reviewed with no changes to be made today.   Outpatient Medications Prior to Visit  Medication Sig Dispense Refill   Accu-Chek Softclix Lancets lancets USE TO CHECK BLOOD SUGAR THREE TIMES DAILY 300 each 0   acetaminophen (TYLENOL) 500 MG tablet Take 500-1,000 mg by mouth every 6 (six) hours as needed for mild pain.     albuterol (VENTOLIN HFA) 108 (90 Base) MCG/ACT inhaler INHALE 2 PUFFS INTO THE LUNGS EVERY 6 HOURS AS NEEDED FOR COUGH 8 each 4   amitriptyline (ELAVIL) 25 MG tablet TAKE 1 TABLET BY MOUTH AT BEDTIME 90 tablet 0   Azelastine-Fluticasone 137-50 MCG/ACT SUSP PLACE 1 SPRAY IN NOSTRIL(S) EVERY 12 HOURS 23 g 0   benzonatate (TESSALON) 200 MG capsule TAKE 1 CAPSULE BY MOUTH 2 TIMES DAILY AS NEEDED FOR COUGH 20 capsule 0   Biotin 10000 MCG TABS Take 10,000 mcg  by mouth daily.     blood glucose meter kit and supplies KIT Dispense based on patient and insurance preference. Use up to four times daily as directed. (FOR ICD-9 250.00, 250.01). Dispense Relion 1 each 0   Blood Glucose Monitoring Suppl (ACCU-CHEK GUIDE) w/Device KIT Use to check blood sugar three times daily. 1 kit 0   BREO ELLIPTA 100-25 MCG/ACT AEPB INHALE 1 PUFF BY MOUTH EVERY DAY 60 each 3   dapagliflozin propanediol (FARXIGA) 5 MG TABS tablet TAKE 1 TABLET BY MOUTH DAILY BEFORE BREAKFAST. 30 tablet 0   ferrous sulfate 325 (65 FE) MG tablet TAKE 1 TABLET BY MOUTH TWICE A DAY WITH FOOD 180 tablet 0   glucose blood (ACCU-CHEK GUIDE) test strip USE TO CHECK BLOOD SUGAR THREE TIMES DAILY 300 strip 0   ketoconazole (NIZORAL) 2 % cream Apply 1  application topically daily as needed for irritation.     loratadine (CLARITIN) 10 MG tablet TAKE 1 TABLET BY MOUTH EVERY DAY 90 tablet 1   losartan-hydrochlorothiazide (HYZAAR) 50-12.5 MG tablet TAKE 1 TABLET BY MOUTH EVERY DAY 90 tablet 1   Menthol, Topical Analgesic, (ICY HOT MEDICATED SPRAY) 16 % LIQD Apply 1 spray topically daily as needed (pain).     metFORMIN (GLUCOPHAGE) 500 MG tablet TAKE 1 TABLET BY MOUTH 2 TIMES DAILY WITH A MEAL. 180 tablet 1   Microlet Lancets MISC USE UP TO FOUR TIMES DAILY AS DIRECTED 100 each 11   mupirocin ointment (BACTROBAN) 2 % Apply topically 2 (two) times daily. 22 g 0   omega-3 acid ethyl esters (LOVAZA) 1 g capsule Take 1 capsule (1 g total) by mouth 2 (two) times daily. 180 capsule 1   ondansetron (ZOFRAN-ODT) 4 MG disintegrating tablet Take 1 tablet (4 mg total) by mouth every 8 (eight) hours as needed for vomiting or nausea. 30 tablet 1   pantoprazole (PROTONIX) 40 MG tablet TAKE 1 TABLET BY MOUTH EVERY DAY 90 tablet 1   propranolol (INDERAL) 20 MG tablet TAKE 1 TABLET BY MOUTH TWICE A DAY 180 tablet 0   rosuvastatin (CRESTOR) 10 MG tablet Take 1 tablet (10 mg total) by mouth daily. 90 tablet 3   Semaglutide,0.25 or 0.5MG /DOS, (OZEMPIC, 0.25 OR 0.5 MG/DOSE,) 2 MG/3ML SOPN INJECT 0.5 MG INTO THE SKIN ONCE A WEEK. 9 mL 1   triamcinolone (KENALOG) 0.025 % ointment Apply 1 Application topically 2 (two) times daily. 30 g 1   vitamin C (ASCORBIC ACID) 500 MG tablet Take 500 mg by mouth 2 (two) times daily.     No facility-administered medications prior to visit.    Allergies  Allergen Reactions   Nsaids     GI BLEED       Objective:    BP 109/73   Pulse 71   Ht 5\' 4"  (1.626 m)   Wt 230 lb 12.8 oz (104.7 kg)   SpO2 97%   BMI 39.62 kg/m  Wt Readings from Last 3 Encounters:  12/06/22 230 lb 12.8 oz (104.7 kg)  11/13/22 232 lb (105.2 kg)  09/30/22 232 lb 3.2 oz (105.3 kg)    Physical Exam Vitals and nursing note reviewed.  Constitutional:       Appearance: She is well-developed.  HENT:     Head: Normocephalic and atraumatic.  Cardiovascular:     Rate and Rhythm: Normal rate and regular rhythm.     Heart sounds: Normal heart sounds. No murmur heard.    No friction rub. No gallop.  Pulmonary:  Effort: Pulmonary effort is normal. No tachypnea or respiratory distress.     Breath sounds: Normal breath sounds. No decreased breath sounds, wheezing, rhonchi or rales.  Chest:     Chest wall: No tenderness.  Abdominal:     General: Bowel sounds are normal.     Palpations: Abdomen is soft.  Musculoskeletal:        General: Normal range of motion.     Cervical back: Normal range of motion.  Skin:    General: Skin is warm and dry.  Neurological:     Mental Status: She is alert and oriented to person, place, and time.     Coordination: Coordination normal.  Psychiatric:        Behavior: Behavior normal. Behavior is cooperative.        Thought Content: Thought content normal.        Judgment: Judgment normal.          Patient has been counseled extensively about nutrition and exercise as well as the importance of adherence with medications and regular follow-up. The patient was given clear instructions to go to ER or return to medical center if symptoms don't improve, worsen or new problems develop. The patient verbalized understanding.   Follow-up: Return in about 3 months (around 03/06/2023).   Gildardo Pounds, FNP-BC [facility] and [place], [place]   12/06/2022, 6:18 PM

## 2022-12-06 NOTE — Progress Notes (Signed)
Right arm discomfort.

## 2022-12-07 LAB — CBC WITH DIFFERENTIAL/PLATELET

## 2022-12-09 LAB — CBC WITH DIFFERENTIAL/PLATELET
Basophils Absolute: 0 10*3/uL (ref 0.0–0.2)
Basos: 0 %
EOS (ABSOLUTE): 0.1 10*3/uL (ref 0.0–0.4)
Eos: 2 %
Hematocrit: 41.4 % (ref 34.0–46.6)
Hemoglobin: 14 g/dL (ref 11.1–15.9)
Immature Grans (Abs): 0 10*3/uL (ref 0.0–0.1)
Immature Granulocytes: 0 %
Lymphocytes Absolute: 1.8 10*3/uL (ref 0.7–3.1)
Lymphs: 27 %
MCH: 30.2 pg (ref 26.6–33.0)
MCHC: 33.8 g/dL (ref 31.5–35.7)
MCV: 89 fL (ref 79–97)
Monocytes Absolute: 0.4 10*3/uL (ref 0.1–0.9)
Monocytes: 6 %
Neutrophils Absolute: 4.5 10*3/uL (ref 1.4–7.0)
Neutrophils: 65 %
Platelets: 66 10*3/uL — CL (ref 150–450)
RBC: 4.63 x10E6/uL (ref 3.77–5.28)
RDW: 13.1 % (ref 11.7–15.4)
WBC: 6.9 10*3/uL (ref 3.4–10.8)

## 2022-12-09 LAB — CMP14+EGFR
ALT: 30 IU/L (ref 0–32)
AST: 29 IU/L (ref 0–40)
Albumin/Globulin Ratio: 1.3 (ref 1.2–2.2)
Albumin: 4.7 g/dL (ref 3.9–4.9)
Alkaline Phosphatase: 108 IU/L (ref 44–121)
BUN/Creatinine Ratio: 19 (ref 9–23)
BUN: 12 mg/dL (ref 6–24)
Bilirubin Total: 0.8 mg/dL (ref 0.0–1.2)
CO2: 23 mmol/L (ref 20–29)
Calcium: 9.7 mg/dL (ref 8.7–10.2)
Chloride: 99 mmol/L (ref 96–106)
Creatinine, Ser: 0.63 mg/dL (ref 0.57–1.00)
Globulin, Total: 3.5 g/dL (ref 1.5–4.5)
Glucose: 90 mg/dL (ref 70–99)
Potassium: 3.8 mmol/L (ref 3.5–5.2)
Sodium: 142 mmol/L (ref 134–144)
Total Protein: 8.2 g/dL (ref 6.0–8.5)
eGFR: 111 mL/min/{1.73_m2} (ref >59)

## 2022-12-15 ENCOUNTER — Other Ambulatory Visit: Payer: Self-pay | Admitting: Nurse Practitioner

## 2022-12-15 DIAGNOSIS — D696 Thrombocytopenia, unspecified: Secondary | ICD-10-CM

## 2022-12-16 ENCOUNTER — Other Ambulatory Visit: Payer: Self-pay | Admitting: Nurse Practitioner

## 2022-12-16 ENCOUNTER — Encounter: Payer: Self-pay | Admitting: Nurse Practitioner

## 2022-12-16 ENCOUNTER — Telehealth: Payer: Self-pay | Admitting: Hematology and Oncology

## 2022-12-16 DIAGNOSIS — G629 Polyneuropathy, unspecified: Secondary | ICD-10-CM

## 2022-12-16 DIAGNOSIS — M542 Cervicalgia: Secondary | ICD-10-CM

## 2022-12-16 NOTE — Telephone Encounter (Signed)
Scheduled appt per 2/11 referral. Called pt, no answer. Left msg with appt date/time. Pt already established with Dr. Lindi Adie.

## 2022-12-22 ENCOUNTER — Other Ambulatory Visit: Payer: Self-pay | Admitting: Nurse Practitioner

## 2022-12-22 DIAGNOSIS — G6289 Other specified polyneuropathies: Secondary | ICD-10-CM

## 2022-12-26 ENCOUNTER — Other Ambulatory Visit: Payer: Self-pay

## 2022-12-26 ENCOUNTER — Other Ambulatory Visit: Payer: Self-pay | Admitting: Nurse Practitioner

## 2022-12-26 ENCOUNTER — Other Ambulatory Visit (HOSPITAL_COMMUNITY): Payer: Self-pay

## 2022-12-26 DIAGNOSIS — M542 Cervicalgia: Secondary | ICD-10-CM

## 2022-12-26 NOTE — Progress Notes (Signed)
Patient Care Team: Gildardo Pounds, NP as PCP - General (Nurse Practitioner)  DIAGNOSIS: No diagnosis found.  SUMMARY OF ONCOLOGIC HISTORY: Oncology History   No history exists.    CHIEF COMPLIANT: Follow-up low platelet   INTERVAL HISTORY: Angela Castillo is a  47 y.o. with above-mentioned history of thrombocytopenia. She presents to the clinic for a follow-up to discuss labs.   ALLERGIES:  is allergic to nsaids.  MEDICATIONS:  Current Outpatient Medications  Medication Sig Dispense Refill   Accu-Chek Softclix Lancets lancets USE TO CHECK BLOOD SUGAR THREE TIMES DAILY 300 each 0   acetaminophen (TYLENOL) 500 MG tablet Take 500-1,000 mg by mouth every 6 (six) hours as needed for mild pain.     albuterol (VENTOLIN HFA) 108 (90 Base) MCG/ACT inhaler INHALE 2 PUFFS INTO THE LUNGS EVERY 6 HOURS AS NEEDED FOR COUGH 8 each 4   amitriptyline (ELAVIL) 25 MG tablet TAKE 1 TABLET BY MOUTH AT BEDTIME 90 tablet 0   Azelastine-Fluticasone 137-50 MCG/ACT SUSP PLACE 1 SPRAY IN NOSTRIL(S) EVERY 12 HOURS 23 g 0   benzonatate (TESSALON) 200 MG capsule TAKE 1 CAPSULE BY MOUTH 2 TIMES DAILY AS NEEDED FOR COUGH 20 capsule 0   Biotin 10000 MCG TABS Take 10,000 mcg by mouth daily.     blood glucose meter kit and supplies KIT Dispense based on patient and insurance preference. Use up to four times daily as directed. (FOR ICD-9 250.00, 250.01). Dispense Relion 1 each 0   Blood Glucose Monitoring Suppl (ACCU-CHEK GUIDE) w/Device KIT Use to check blood sugar three times daily. 1 kit 0   BREO ELLIPTA 100-25 MCG/ACT AEPB INHALE 1 PUFF BY MOUTH EVERY DAY 60 each 3   dapagliflozin propanediol (FARXIGA) 5 MG TABS tablet TAKE 1 TABLET BY MOUTH DAILY BEFORE BREAKFAST. 30 tablet 0   diclofenac Sodium (VOLTAREN) 1 % GEL Apply 2 g topically 4 (four) times daily. 200 g 1   ferrous sulfate 325 (65 FE) MG tablet TAKE 1 TABLET BY MOUTH TWICE A DAY WITH FOOD 180 tablet 0   glucose blood (ACCU-CHEK GUIDE) test  strip USE TO CHECK BLOOD SUGAR THREE TIMES DAILY 300 strip 0   ketoconazole (NIZORAL) 2 % cream Apply 1 application topically daily as needed for irritation.     loratadine (CLARITIN) 10 MG tablet TAKE 1 TABLET BY MOUTH EVERY DAY 90 tablet 1   losartan-hydrochlorothiazide (HYZAAR) 50-12.5 MG tablet TAKE 1 TABLET BY MOUTH EVERY DAY 90 tablet 1   Menthol, Topical Analgesic, (ICY HOT MEDICATED SPRAY) 16 % LIQD Apply 1 spray topically daily as needed (pain).     metFORMIN (GLUCOPHAGE) 500 MG tablet TAKE 1 TABLET BY MOUTH 2 TIMES DAILY WITH A MEAL. 180 tablet 1   methocarbamol (ROBAXIN) 500 MG tablet Take 1 tablet (500 mg total) by mouth every 8 (eight) hours as needed for muscle spasms. 60 tablet 0   Microlet Lancets MISC USE UP TO FOUR TIMES DAILY AS DIRECTED 100 each 11   mupirocin ointment (BACTROBAN) 2 % Apply topically 2 (two) times daily. 22 g 0   omega-3 acid ethyl esters (LOVAZA) 1 g capsule Take 1 capsule (1 g total) by mouth 2 (two) times daily. 180 capsule 1   ondansetron (ZOFRAN-ODT) 4 MG disintegrating tablet Take 1 tablet (4 mg total) by mouth every 8 (eight) hours as needed for vomiting or nausea. 30 tablet 1   pantoprazole (PROTONIX) 40 MG tablet TAKE 1 TABLET BY MOUTH EVERY DAY 90 tablet 1  propranolol (INDERAL) 20 MG tablet TAKE 1 TABLET BY MOUTH TWICE A DAY 180 tablet 0   rosuvastatin (CRESTOR) 10 MG tablet Take 1 tablet (10 mg total) by mouth daily. 90 tablet 3   Semaglutide,0.25 or 0.'5MG'$ /DOS, (OZEMPIC, 0.25 OR 0.5 MG/DOSE,) 2 MG/3ML SOPN INJECT 0.5 MG INTO THE SKIN ONCE A WEEK. 9 mL 1   triamcinolone (KENALOG) 0.025 % ointment Apply 1 Application topically 2 (two) times daily. 30 g 1   vitamin C (ASCORBIC ACID) 500 MG tablet Take 500 mg by mouth 2 (two) times daily.     No current facility-administered medications for this visit.    PHYSICAL EXAMINATION: ECOG PERFORMANCE STATUS: {CHL ONC ECOG [phone]}  There were no vitals filed for this visit. There were no vitals  filed for this visit.  BREAST:*** No palpable masses or nodules in either right or left breasts. No palpable axillary supraclavicular or infraclavicular adenopathy no breast tenderness or nipple discharge. (exam performed in the presence of a chaperone)  LABORATORY DATA:  I have reviewed the data as listed    Latest Ref Rng & Units 12/06/2022    4:00 PM 07/26/2022   11:56 AM 06/05/2022    4:08 PM  CMP  Glucose 70 - 99 mg/dL 90  78  166   BUN 6 - 24 mg/dL '12  9  8   '$ Creatinine 0.57 - 1.00 mg/dL 0.63  0.65  0.68   Sodium 134 - 144 mmol/L 142  141  140   Potassium 3.5 - 5.2 mmol/L 3.8  4.1  3.9   Chloride 96 - 106 mmol/L 99  100  97   CO2 20 - 29 mmol/L '23  26  29   '$ Calcium 8.7 - 10.2 mg/dL 9.7  9.9  9.9   Total Protein 6.0 - 8.5 g/dL 8.2  8.2  8.9   Total Bilirubin 0.0 - 1.2 mg/dL 0.8  1.0  0.6   Alkaline Phos 44 - 121 IU/L 108  111  179   AST 0 - 40 IU/L 29  31  49   ALT 0 - 32 IU/L 30  33  65     Lab Results  Component Value Date   WBC 6.9 12/06/2022   HGB 14.0 12/06/2022   HCT 41.4 12/06/2022   MCV 89 12/06/2022   PLT 66 (LL) 12/06/2022   NEUTROABS 4.5 12/06/2022    ASSESSMENT & PLAN:  No problem-specific Assessment & Plan notes found for this encounter.    No orders of the defined types were placed in this encounter.  The patient has a good understanding of the overall plan. she agrees with it. she will call with any problems that may develop before the next visit here. Total time spent: 30 mins including face to face time and time spent for planning, charting and co-ordination of care   Suzzette Righter, [place] 12/26/22    I Gardiner Coins am acting as a Education administrator for [employer]  ***

## 2022-12-26 NOTE — Telephone Encounter (Signed)
Requested medications are due for refill today.  Provider to determine  Requested medications are on the active medications list.  yes  Last refill. 12/28/2022 #60 0 rf  Future visit scheduled.   yes  Notes to clinic.  Refill not delegated.    Requested Prescriptions  Pending Prescriptions Disp Refills   methocarbamol (ROBAXIN) 500 MG tablet [Pharmacy Med Name: METHOCARBAMOL 500 MG TABLET] 60 tablet 0    Sig: TAKE 1 TABLET BY MOUTH EVERY 8 HOURS AS NEEDED FOR MUSCLE SPASMS.     Not Delegated - Analgesics:  Muscle Relaxants Failed - 12/26/2022  9:36 AM      Failed - This refill cannot be delegated      Passed - Valid encounter within last 6 months    Recent Outpatient Visits           2 weeks ago Type 2 diabetes mellitus with unspecified complications [facility])   [place], Vernia Buff, NP   4 months ago Encounter for Papanicolaou smear for cervical cancer screening   [place], [place] W, NP   6 months ago Type 2 diabetes mellitus without complication, without long-term current use of insulin [facility])   [place], Vernia Buff, NP   10 months ago Chronic cough   [place] Gildardo Pounds, NP   11 months ago Chronic cough   [place] Gildardo Pounds, NP       Future Appointments             In 2 months Gildardo Pounds, NP [place]

## 2022-12-31 ENCOUNTER — Inpatient Hospital Stay: Payer: 59

## 2022-12-31 ENCOUNTER — Inpatient Hospital Stay: Payer: 59 | Attending: Hematology and Oncology | Admitting: Hematology and Oncology

## 2022-12-31 VITALS — BP 130/79 | HR 76 | Temp 98.1°F | Resp 16 | Wt 229.6 lb

## 2022-12-31 DIAGNOSIS — Z886 Allergy status to analgesic agent status: Secondary | ICD-10-CM | POA: Insufficient documentation

## 2022-12-31 DIAGNOSIS — R161 Splenomegaly, not elsewhere classified: Secondary | ICD-10-CM | POA: Diagnosis not present

## 2022-12-31 DIAGNOSIS — D696 Thrombocytopenia, unspecified: Secondary | ICD-10-CM

## 2022-12-31 DIAGNOSIS — Z79899 Other long term (current) drug therapy: Secondary | ICD-10-CM | POA: Diagnosis not present

## 2022-12-31 DIAGNOSIS — K7460 Unspecified cirrhosis of liver: Secondary | ICD-10-CM | POA: Diagnosis not present

## 2022-12-31 LAB — CBC WITH DIFFERENTIAL (CANCER CENTER ONLY)
Abs Immature Granulocytes: 0.01 K/uL (ref 0.00–0.07)
Basophils Absolute: 0 K/uL (ref 0.0–0.1)
Basophils Relative: 0 %
Eosinophils Absolute: 0.1 K/uL (ref 0.0–0.5)
Eosinophils Relative: 2 %
HCT: 41.6 % (ref 36.0–46.0)
Hemoglobin: 13.8 g/dL (ref 12.0–15.0)
Immature Granulocytes: 0 %
Lymphocytes Relative: 30 %
Lymphs Abs: 1.6 K/uL (ref 0.7–4.0)
MCH: 30.3 pg (ref 26.0–34.0)
MCHC: 33.2 g/dL (ref 30.0–36.0)
MCV: 91.2 fL (ref 80.0–100.0)
Monocytes Absolute: 0.4 K/uL (ref 0.1–1.0)
Monocytes Relative: 7 %
Neutro Abs: 3.3 K/uL (ref 1.7–7.7)
Neutrophils Relative %: 61 %
Platelet Count: 81 K/uL — ABNORMAL LOW (ref 150–400)
RBC: 4.56 MIL/uL (ref 3.87–5.11)
RDW: 13.5 % (ref 11.5–15.5)
WBC Count: 5.4 K/uL (ref 4.0–10.5)
nRBC: 0 % (ref 0.0–0.2)

## 2022-12-31 LAB — IMMATURE PLATELET FRACTION: Immature Platelet Fraction: 9.4 % — ABNORMAL HIGH (ref 1.2–8.6)

## 2022-12-31 NOTE — Assessment & Plan Note (Signed)
Originally started August 2015: Platelet count 130 01/03/2015: Hemoglobin 6.4: Required blood transfusion and was found to be iron deficient 05/01/2017: Platelet count relatively stable 147 September 2019: Platelet count 110 03/21/2019: Platelet count 94 during hospitalization for cirrhosis and gastric variceal bleeding status post banding complicated by aspiration pneumonia 03/29/2019 at the time of discharge platelet count 104 06/25/2019: Platelet count 74 06/13/2020: Platelets 107 06/27/2021: Platelets 100 12/06/2022: Platelets 66   Differential diagnosis: Cirrhosis and splenomegaly related thrombocytopenia versus ITP Previous work-up has been negative hepatitis B, hepatitis C, HIV   Continue watchful monitoring with a follow-up in 1 year.

## 2023-01-03 ENCOUNTER — Other Ambulatory Visit: Payer: Self-pay

## 2023-01-04 ENCOUNTER — Other Ambulatory Visit: Payer: Self-pay | Admitting: Family Medicine

## 2023-01-04 DIAGNOSIS — R519 Headache, unspecified: Secondary | ICD-10-CM

## 2023-01-07 ENCOUNTER — Other Ambulatory Visit: Payer: Self-pay | Admitting: Nurse Practitioner

## 2023-01-07 DIAGNOSIS — D508 Other iron deficiency anemias: Secondary | ICD-10-CM

## 2023-01-07 NOTE — Telephone Encounter (Signed)
Requested medication (s) are due for refill today: due 01/12/23  Requested medication (s) are on the active medication list: yes    Last refill: 10/13/22  #180  0 refills  Future visit scheduled yes 03/07/23  Notes to clinic:Failed due to labs, please review. Thank you.  Requested Prescriptions  Pending Prescriptions Disp Refills   ferrous sulfate 325 (65 FE) MG tablet [Pharmacy Med Name: FERROUS SULFATE 325 MG TABLET] 180 tablet 0    Sig: TAKE 1 TABLET BY MOUTH TWICE A DAY WITH FOOD     Endocrinology:  Minerals - Iron Supplementation Failed - 01/07/2023  2:06 PM      Failed - Fe (serum) in normal range and within 360 days    Iron  Date Value Ref Range Status  01/03/2015 11 (L) 42 - 145 ug/dL Final   Saturation Ratios  Date Value Ref Range Status  01/03/2015 2 (L) 20 - 55 % Final         Failed - Ferritin in normal range and within 360 days    Ferritin  Date Value Ref Range Status  01/03/2015 12 10 - 291 ng/mL Final    Comment:    Performed at [place] - HGB in normal range and within 360 days    Hemoglobin  Date Value Ref Range Status  12/31/2022 13.8 12.0 - 15.0 g/dL Final  12/06/2022 14.0 11.1 - 15.9 g/dL Final         Passed - HCT in normal range and within 360 days    HCT  Date Value Ref Range Status  12/31/2022 41.6 36.0 - 46.0 % Final   Hematocrit  Date Value Ref Range Status  12/06/2022 41.4 34.0 - 46.6 % Final         Passed - RBC in normal range and within 360 days    RBC  Date Value Ref Range Status  12/31/2022 4.56 3.87 - 5.11 MIL/uL Final         Passed - Valid encounter within last 12 months    Recent Outpatient Visits           1 month ago Type 2 diabetes mellitus with unspecified complications [facility])   [place], [place], NP   4 months ago Encounter for Papanicolaou smear for cervical cancer screening   [place], [place] W, NP    7 months ago Type 2 diabetes mellitus without complication, without long-term current use of insulin [facility])   [place], Vernia Buff, NP   10 months ago Chronic cough   [place], Vernia Buff, NP   11 months ago Chronic cough   [place] Gildardo Pounds, NP       Future Appointments             In 1 month Gildardo Pounds, NP [place]

## 2023-01-20 ENCOUNTER — Other Ambulatory Visit: Payer: Self-pay | Admitting: Family Medicine

## 2023-01-20 DIAGNOSIS — E119 Type 2 diabetes mellitus without complications: Secondary | ICD-10-CM

## 2023-01-23 ENCOUNTER — Ambulatory Visit (INDEPENDENT_AMBULATORY_CARE_PROVIDER_SITE_OTHER): Payer: BC Managed Care – PPO | Admitting: Podiatry

## 2023-01-23 DIAGNOSIS — L600 Ingrowing nail: Secondary | ICD-10-CM

## 2023-01-23 DIAGNOSIS — R52 Pain, unspecified: Secondary | ICD-10-CM | POA: Diagnosis not present

## 2023-01-23 MED ORDER — CICLOPIROX 8 % EX SOLN: Freq: Every day | CUTANEOUS | 0 refills | Status: DC

## 2023-01-23 NOTE — Patient Instructions (Signed)

## 2023-01-23 NOTE — Progress Notes (Signed)
Subjective: Chief Complaint  Patient presents with   Ingrown Toenail    Right 3-5th digits, problem has occurred sesveral weeks ago   47 year old female presents the office today for concerns of ingrown toenail to right fourth toe, lateral aspect which is causing discomfort.  This issue is new.  Certainly check the other toenails.  The right third toe causes discomfort on the nail border.  No drainage or pus.  No red streaks.  No fever or chills.  No other concerns.  Objective: AAO x3, NAD DP/PT pulses palpable bilaterally, CRT less than 3 seconds Incurvation present to the right lateral third digit nail border with localized edema and mild erythema.  There is no drainage or pus.  There is no ascending cellulitis.  No fluctuance or crepitation.  There is no malodor. No pain with calf compression, swelling, warmth, erythema  Assessment: Ingrown toenail right third nail border  Plan: -All treatment options discussed with the patient including all alternatives, risks, complications.  -Discussed with conservative as well as surgical options. -At this time, the patient is requesting partial nail removal with chemical matricectomy to the symptomatic portion of the nail. Risks and complications were discussed with the patient for which they understand and written consent was obtained. Under sterile conditions a total of 3 mL of a mixture of 2% lidocaine plain and 0.5% Marcaine plain was infiltrated in a digital block fashion. Once anesthetized, the skin was prepped in sterile fashion. A tourniquet was then applied. Next the lateral aspect of third digit nail border was then sharply excised making sure to remove the entire offending nail border. Once the nails were ensured to be removed area was debrided and the underlying skin was intact. There is no purulence identified in the procedure. Next phenol was then applied under standard conditions and copiously irrigated.  Silvadene was applied. A dry  sterile dressing was applied. After application of the dressing the tourniquet was removed and there is found to be an immediate capillary refill time to the digit. The patient tolerated the procedure well any complications. Post procedure instructions were discussed the patient for which he verbally understood. Discussed signs/symptoms of infection and directed to call the office immediately should any occur or go directly to the emergency room. In the meantime, encouraged to call the office with any questions, concerns, changes symptoms. -Monitor toenails.  No signs of infection or significant ingrown toenail noted today. -Patient encouraged to call the office with any questions, concerns, change in symptoms.

## 2023-02-03 ENCOUNTER — Encounter: Payer: Self-pay | Admitting: Nurse Practitioner

## 2023-02-06 ENCOUNTER — Ambulatory Visit (INDEPENDENT_AMBULATORY_CARE_PROVIDER_SITE_OTHER): Payer: BC Managed Care – PPO | Admitting: Podiatry

## 2023-02-06 ENCOUNTER — Ambulatory Visit (INDEPENDENT_AMBULATORY_CARE_PROVIDER_SITE_OTHER): Payer: BC Managed Care – PPO

## 2023-02-06 DIAGNOSIS — M25572 Pain in left ankle and joints of left foot: Secondary | ICD-10-CM

## 2023-02-06 DIAGNOSIS — L600 Ingrowing nail: Secondary | ICD-10-CM | POA: Diagnosis not present

## 2023-02-06 DIAGNOSIS — L03031 Cellulitis of right toe: Secondary | ICD-10-CM | POA: Diagnosis not present

## 2023-02-06 DIAGNOSIS — M7752 Other enthesopathy of left foot: Secondary | ICD-10-CM | POA: Diagnosis not present

## 2023-02-06 MED ORDER — CEPHALEXIN 500 MG PO CAPS: 500.0000 mg | ORAL_CAPSULE | Freq: Three times a day (TID) | ORAL | 0 refills | Status: DC

## 2023-02-09 NOTE — Progress Notes (Signed)
Subjective: Chief Complaint  Patient presents with   Nail Problem    Nail check     47 year old female presents the office today for follow-up evaluation after undergoing partial nail avulsion.  She said that she is in significant pain with that she has noticed some redness and she was not sure if it was an allergic reaction to the antibiotic ointment or not.  She has been 7 but missed a couple of days.  She is having some discomfort in the anterior aspect of the left ankle.  No injuries.  Pain is intermittent and not daily.   Objective: AAO x3, NAD DP/PT pulses palpable bilaterally, CRT less than 3 seconds With partial nail avulsion there is localized edema erythema present.  There is no ascending cellulitis.  Some scabbing present along the procedure site.  There is no drainage or pus today.  Slight tenderness to palpation. On the anterior aspect ankle was localized tenderness on the ankle gutter.  Some edema is present.  There is no erythema or warmth.  There is no area pinpoint tenderness. No pain with calf compression, swelling, warmth, erythema  Assessment: Ingrown toenail right third nail border-erythema; capsulitis  Plan: -All treatment options discussed with the patient including all alternatives, risks, complications.  -Discussed with conservative as well as surgical options. -X-rays obtained reviewed of the left ankle.  No evidence of acute fracture.  Joint space maintained -We discussed steroid injection, hold off on this today given the infection on the right foot.  Continue shoes, good arch support.  Bracing if needed discussed topical medications, creams to apply as well. -Discussed try to soak twice a day and Epsom salts, antibiotic ointment and a bandage.  Not able to soak due to work wash with soap and water well.  Antibiotic ointment dressing changes daily between the area open at night.  Prescribed Keflex. -Monitor for any clinical signs or symptoms of infection and  directed to call the office immediately should any occur or go to the ER.  Return in about 3 weeks (around 02/27/2023) for steroid injection left ankle; right nail check .  Vivi Barrack DPM ons, concerns, change in symptoms.

## 2023-02-13 ENCOUNTER — Other Ambulatory Visit: Payer: Self-pay | Admitting: Nurse Practitioner

## 2023-02-13 ENCOUNTER — Encounter: Payer: Self-pay | Admitting: Nurse Practitioner

## 2023-02-13 DIAGNOSIS — K219 Gastro-esophageal reflux disease without esophagitis: Secondary | ICD-10-CM

## 2023-02-13 NOTE — Telephone Encounter (Signed)
Requested Prescriptions  Pending Prescriptions Disp Refills   propranolol (INDERAL) 20 MG tablet [Pharmacy Med Name: PROPRANOLOL 20 MG TABLET] 180 tablet 0    Sig: TAKE 1 TABLET BY MOUTH TWICE A DAY     Cardiovascular:  Beta Blockers Passed - 02/13/2023  1:25 AM      Passed - Last BP in normal range    BP Readings from Last 1 Encounters:  12/31/22 130/79         Passed - Last Heart Rate in normal range    Pulse Readings from Last 1 Encounters:  12/31/22 76         Passed - Valid encounter within last 6 months    Recent Outpatient Visits           2 months ago Type 2 diabetes mellitus with unspecified complications [facility])   [facility] [place], [place], NP   5 months ago Encounter for Papanicolaou smear for cervical cancer screening   [place] [facility] [place], [place], NP   8 months ago Type 2 diabetes mellitus without complication, without long-term current use of insulin [facility])    [facility] [place], Shea Stakes, NP   11 months ago Chronic cough   [facility] [place], Shea Stakes, NP   1 year ago Chronic cough   [facility] [place], Shea Stakes, NP       Future Appointments             In 3 weeks Claiborne Rigg, NP [facility]

## 2023-02-17 ENCOUNTER — Other Ambulatory Visit: Payer: Self-pay | Admitting: Family Medicine

## 2023-02-17 DIAGNOSIS — R1084 Generalized abdominal pain: Secondary | ICD-10-CM | POA: Diagnosis not present

## 2023-02-17 DIAGNOSIS — M542 Cervicalgia: Secondary | ICD-10-CM

## 2023-02-17 DIAGNOSIS — R12 Heartburn: Secondary | ICD-10-CM | POA: Diagnosis not present

## 2023-02-17 DIAGNOSIS — K219 Gastro-esophageal reflux disease without esophagitis: Secondary | ICD-10-CM | POA: Diagnosis not present

## 2023-02-17 DIAGNOSIS — K7460 Unspecified cirrhosis of liver: Secondary | ICD-10-CM | POA: Diagnosis not present

## 2023-02-18 ENCOUNTER — Other Ambulatory Visit: Payer: Self-pay | Admitting: Gastroenterology

## 2023-02-18 ENCOUNTER — Encounter: Payer: Self-pay | Admitting: Neurology

## 2023-02-18 ENCOUNTER — Ambulatory Visit: Payer: BC Managed Care – PPO | Admitting: Neurology

## 2023-02-18 VITALS — BP 124/83 | HR 67 | Ht 64.5 in | Wt 230.5 lb

## 2023-02-18 DIAGNOSIS — M542 Cervicalgia: Secondary | ICD-10-CM

## 2023-02-18 DIAGNOSIS — R202 Paresthesia of skin: Secondary | ICD-10-CM | POA: Insufficient documentation

## 2023-02-18 DIAGNOSIS — K7460 Unspecified cirrhosis of liver: Secondary | ICD-10-CM

## 2023-02-18 NOTE — Progress Notes (Signed)
Chief Complaint  Patient presents with   New Patient (Initial Visit)    Rm 13, alone Polyneuropathy (pt stated feels like circulation in all extremities is cut off and causes arms and legs to feel numb radiating to feet and hands ongoing over a year       ASSESSMENT AND PLAN  Angela Castillo is a 47 y.o. female   Intermittent upper and lower extremity paresthesia, involving fourth and fifth finger most Chronic neck pain  Brisk reflex,  MRI of cervical spine to rule out cervical spondylitic myelopathy  EMG nerve conduction study to evaluation for potential focal neuropathy   DIAGNOSTIC DATA (LABS, IMAGING, TESTING) - I reviewed patient records, labs, notes, testing and imaging myself where available.   MEDICAL HISTORY:  Angela Castillo, is a 47 year old female seen in request by her primary care nurse practitioner Bertram Denver for for evaluation of bilateral upper and lower extremity paresthesia, initial evaluation was on February 18, 2023,  I reviewed and summarized the referring note.PMHX. DM since Mya 2020 Chronic migraine. HTN HLD Liver cirrhoses due to NASH She was diagnosed with liver cirrhosis due to alcohol steatotic hepatic disease around May 2020, after GI bleeding,  Around 2023, she began to notice intermittent numbness tingling involving both feet and hands, as if circulation was cut off, denies pain  She denies neck pain, no radiating pain along her spine, no bowel or bladder incontinence  PHYSICAL EXAM:   Vitals:   02/18/23 1519  BP: 124/83  Pulse: 67  Weight: 230 lb 8 oz (104.6 kg)  Height: 5' 4.5" (1.638 m)    Body mass index is 38.95 kg/m.  PHYSICAL EXAMNIATION:  Gen: NAD, conversant, well nourised, well groomed                     Cardiovascular: Regular rate rhythm, no peripheral edema, warm, nontender. Eyes: Conjunctivae clear without exudates or hemorrhage Neck: Supple, no carotid bruits. Pulmonary: Clear to auscultation  bilaterally   NEUROLOGICAL EXAM:  MENTAL STATUS: Speech/cognition: Awake, alert, oriented to history taking and casual conversation CRANIAL NERVES: CN II: Visual fields are full to confrontation. Pupils are round equal and briskly reactive to light. CN III, IV, VI: extraocular movement are normal. No ptosis. CN V: Facial sensation is intact to light touch CN VII: Face is symmetric with normal eye closure  CN VIII: Hearing is normal to causal conversation. CN IX, X: Phonation is normal. CN XI: Head turning and shoulder shrug are intact  MOTOR: There is no pronator drift of out-stretched arms. Muscle bulk and tone are normal. Muscle strength is normal.  REFLEXES: Reflexes are 3 and symmetric at the biceps, triceps, knees, and ankles. Plantar responses are flexor.  SENSORY: Intact to light touch, pinprick and vibratory sensation are intact in fingers and toes.  COORDINATION: There is no trunk or limb dysmetria noted.  GAIT/STANCE: Posture is normal. Gait is steady   REVIEW OF SYSTEMS:  Full 14 system review of systems performed and notable only for as above All other review of systems were negative.   ALLERGIES: Allergies  Allergen Reactions   Nsaids     GI BLEED    HOME MEDICATIONS: Current Outpatient Medications  Medication Sig Dispense Refill   Accu-Chek Softclix Lancets lancets USE TO CHECK BLOOD SUGAR THREE TIMES DAILY 300 each 0   acetaminophen (TYLENOL) 500 MG tablet Take 500-1,000 mg by mouth every 6 (six) hours as needed for mild pain.  amitriptyline (ELAVIL) 25 MG tablet TAKE 1 TABLET BY MOUTH AT BEDTIME 90 tablet 1   Azelastine-Fluticasone 137-50 MCG/ACT SUSP PLACE 1 SPRAY IN NOSTRIL(S) EVERY 12 HOURS 23 g 0   Biotin [address] MCG TABS Take 10,000 mcg by mouth daily.     blood glucose meter kit and supplies KIT Dispense based on patient and insurance preference. Use up to four times daily as directed. (FOR ICD-9 250.00, 250.01). Dispense Relion (Patient  taking differently: Dispense based on patient and insurance preference. Use up to four times daily as directed. (FOR ICD-9 250.00, 250.01). Dispense Relion as needed) 1 each 0   Blood Glucose Monitoring Suppl (ACCU-CHEK GUIDE) w/Device KIT Use to check blood sugar three times daily. 1 kit 0   cephALEXin (KEFLEX) 500 MG capsule Take 1 capsule (500 mg total) by mouth 3 (three) times daily. 21 capsule 0   ciclopirox (PENLAC) 8 % solution Apply topically at bedtime. Apply over nail and surrounding skin. Apply daily over previous coat. After seven (7) days, may remove with alcohol and continue cycle. 6.6 mL 0   dapagliflozin propanediol (FARXIGA) 5 MG TABS tablet TAKE 1 TABLET BY MOUTH EVERY DAY BEFORE BREAKFAST 90 tablet 0   ferrous sulfate 325 (65 FE) MG tablet TAKE 1 TABLET BY MOUTH TWICE A DAY WITH FOOD 180 tablet 0   glucose blood (ACCU-CHEK GUIDE) test strip USE TO CHECK BLOOD SUGAR THREE TIMES DAILY 300 strip 0   ketoconazole (NIZORAL) 2 % cream Apply 1 application  topically daily as needed for irritation (as needed).     loratadine (CLARITIN) 10 MG tablet TAKE 1 TABLET BY MOUTH EVERY DAY (Patient taking differently: Take 10 mg by mouth daily as needed (as needed).) 90 tablet 1   losartan-hydrochlorothiazide (HYZAAR) 50-12.5 MG tablet TAKE 1 TABLET BY MOUTH EVERY DAY 90 tablet 1   Menthol, Topical Analgesic, (ICY HOT MEDICATED SPRAY) 16 % LIQD Apply 1 spray topically daily as needed (pain).     metFORMIN (GLUCOPHAGE) 500 MG tablet TAKE 1 TABLET BY MOUTH 2 TIMES DAILY WITH A MEAL. 180 tablet 1   methocarbamol (ROBAXIN) 500 MG tablet TAKE 1 TABLET BY MOUTH EVERY 8 HOURS AS NEEDED FOR MUSCLE SPASM 60 tablet 0   Microlet Lancets MISC USE UP TO FOUR TIMES DAILY AS DIRECTED 100 each 11   mupirocin ointment (BACTROBAN) 2 % Apply topically 2 (two) times daily. 22 g 0   omega-3 acid ethyl esters (LOVAZA) 1 g capsule Take 1 capsule (1 g total) by mouth 2 (two) times daily. 180 capsule 1   ondansetron  (ZOFRAN-ODT) 4 MG disintegrating tablet Take 1 tablet (4 mg total) by mouth every 8 (eight) hours as needed for vomiting or nausea. 30 tablet 1   pantoprazole (PROTONIX) 40 MG tablet TAKE 1 TABLET BY MOUTH EVERY DAY 90 tablet 1   propranolol (INDERAL) 20 MG tablet TAKE 1 TABLET BY MOUTH TWICE A DAY 180 tablet 0   rosuvastatin (CRESTOR) 10 MG tablet Take 1 tablet (10 mg total) by mouth daily. 90 tablet 3   Semaglutide,0.25 or 0.5MG /DOS, (OZEMPIC, 0.25 OR 0.5 MG/DOSE,) 2 MG/3ML SOPN INJECT 0.5 MG INTO THE SKIN ONCE A WEEK. 9 mL 1   triamcinolone (KENALOG) 0.025 % ointment Apply 1 Application topically 2 (two) times daily. 30 g 1   vitamin C (ASCORBIC ACID) 500 MG tablet Take 500 mg by mouth 2 (two) times daily.     No current facility-administered medications for this visit.    PAST MEDICAL HISTORY:  Past Medical History:  Diagnosis Date   Anemia Dx January 03 2015   Diabetes mellitus without complication    Elevated liver enzymes    Essential hypertension Dx Apr 2016   Fatty liver    H/O: upper GI bleed 03/2019    variceal bleed    Headache    History of blood transfusion 03/2019   Liver cirrhosis secondary to NASH    Neuropathy    Pneumonia 03/2019   Reflux    Thrombocytopenia     PAST SURGICAL HISTORY: Past Surgical History:  Procedure Laterality Date   DILITATION & CURRETTAGE/HYSTROSCOPY WITH HYDROTHERMAL ABLATION N/A 11/20/2021   Procedure: DILATATION & CURETTAGE/HYSTEROSCOPY WITH HYDROTHERMAL ABLATION;  Surgeon: Reva Bores, MD;  Location: [facility] OR;  Service: Gynecology;  Laterality: N/A;  rep will be here confirmed on 11/12/21 CS   ESOPHAGEAL BANDING  03/22/2019   Procedure: ESOPHAGEAL BANDING;  Surgeon: Charlott Rakes, MD;  Location: [facility] END[facility]COPY;  Service: Endoscopy;;   ESOPHAGEAL BANDING  03/23/2019   Procedure: ESOPHAGEAL BANDING;  Surgeon: Charlott Rakes, MD;  Location: [facility];  Service: Endoscopy;;   ESOPHAGEAL BANDING  09/20/2019   Procedure: ESOPHAGEAL  BANDING;  Surgeon: Charlott Rakes, MD;  Location: [facility];  Service: Endoscopy;;   ESOPHAGEAL BANDING N/A 12/21/2019   Procedure: ESOPHAGEAL BANDING;  Surgeon: Charlott Rakes, MD;  Location: [facility];  Service: Endoscopy;  Laterality: N/A;   ESOPHAGOGASTRODUODENOSCOPY N/A 03/22/2019   Procedure: ESOPHAGOGASTRODUODENOSCOPY (EGD);  Surgeon: Charlott Rakes, MD;  Location: [facility];  Service: Endoscopy;  Laterality: N/A;   ESOPHAGOGASTRODUODENOSCOPY N/A 03/23/2019   Procedure: ESOPHAGOGASTRODUODENOSCOPY (EGD);  Surgeon: Charlott Rakes, MD;  Location: [facility];  Service: Endoscopy;  Laterality: N/A;   ESOPHAGOGASTRODUODENOSCOPY (EGD) WITH PROPOFOL N/A 07/23/2019   Procedure: ESOPHAGOGASTRODUODENOSCOPY (EGD) WITH PROPOFOL with possible banding;  Surgeon: Charlott Rakes, MD;  Location: [facility];  Service: Endoscopy;  Laterality: N/A;   ESOPHAGOGASTRODUODENOSCOPY (EGD) WITH PROPOFOL N/A 09/20/2019   Procedure: ESOPHAGOGASTRODUODENOSCOPY (EGD) WITH PROPOFOL;  Surgeon: Charlott Rakes, MD;  Location: [facility];  Service: Endoscopy;  Laterality: N/A;   ESOPHAGOGASTRODUODENOSCOPY (EGD) WITH PROPOFOL N/A 12/21/2019   Procedure: ESOPHAGOGASTRODUODENOSCOPY (EGD) WITH PROPOFOL with possible banding;  Surgeon: Charlott Rakes, MD;  Location: [facility];  Service: Endoscopy;  Laterality: N/A;   HERNIA REPAIR     TUBAL LIGATION  07/25/2000   VENTRAL HERNIA REPAIR N/A 07/06/2020   Procedure: VENTRAL HERNIA REPAIR WITH MESH;  Surgeon: Abigail Miyamoto, MD;  Location: [facility] OR;  Service: General;  Laterality: N/A;    FAMILY HISTORY: Family History  Problem Relation Age of Onset   Diabetes Father    COPD Mother    Esophageal cancer Mother    Diabetes Mother    Cancer Mother        esophageal   Hypertension Mother    Breast cancer Maternal Aunt     SOCIAL HISTORY: Social History   Socioeconomic History   Marital status: Married    Spouse name: Not on file    Number of children: 2   Years of education: Not on file   Highest education level: Not on file  Occupational History    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  Tobacco Use   Smoking status: Never   Smokeless tobacco: Never  Vaping Use   Vaping Use: Never used  Substance and Sexual Activity   Alcohol use: Not Currently    Comment: rarely-last alcohol drank in 08/2018   Drug use: No   Sexual activity: Yes  Other Topics  Concern   Not on file  Social History Narrative   Not on file   Social Determinants of Health   Financial Resource Strain: Not on file  Food Insecurity: No Food Insecurity (11/01/2021)   Hunger Vital Sign    Worried About Running Out of Food in the Last Year: Never true    Ran Out of Food in the Last Year: Never true  Transportation Needs: No Transportation Needs (11/01/2021)   PRAPARE - Administrator, Civil Service (Medical): No    Lack of Transportation (Non-Medical): No  Physical Activity: Not on file  Stress: Not on file  Social Connections: Not on file  Intimate Partner Violence: Not on file      Levert Feinstein, M.D. Ph.D.  [facility] [address], [address] [place], [place] [address] Ph: [phone] Fax: [phone]  CC:  Claiborne Rigg, NP [address] [place] [address] [place],  [place] [address]  Claiborne Rigg, NP

## 2023-02-21 ENCOUNTER — Other Ambulatory Visit: Payer: Self-pay | Admitting: Nurse Practitioner

## 2023-02-21 MED ORDER — OZEMPIC (0.25 OR 0.5 MG/DOSE) 2 MG/3ML ~~LOC~~ SOPN: 0.5000 mg | PEN_INJECTOR | SUBCUTANEOUS | 1 refills | Status: DC

## 2023-02-21 NOTE — Telephone Encounter (Signed)
Just FYI> they may need prior auth at [employer]. Thanks!

## 2023-02-25 ENCOUNTER — Other Ambulatory Visit (HOSPITAL_COMMUNITY): Payer: Self-pay

## 2023-02-25 ENCOUNTER — Other Ambulatory Visit: Payer: Self-pay

## 2023-02-25 ENCOUNTER — Ambulatory Visit (INDEPENDENT_AMBULATORY_CARE_PROVIDER_SITE_OTHER): Payer: BC Managed Care – PPO

## 2023-02-25 DIAGNOSIS — M542 Cervicalgia: Secondary | ICD-10-CM | POA: Diagnosis not present

## 2023-02-25 DIAGNOSIS — R202 Paresthesia of skin: Secondary | ICD-10-CM | POA: Diagnosis not present

## 2023-02-25 NOTE — Telephone Encounter (Signed)
Can someone assist with this for me. Thank you so much!

## 2023-02-26 ENCOUNTER — Other Ambulatory Visit: Payer: Self-pay

## 2023-02-26 NOTE — Telephone Encounter (Signed)
Sandi Mariscal!!! Any word on this prior auth? Thanks so much

## 2023-02-27 ENCOUNTER — Other Ambulatory Visit: Payer: Self-pay

## 2023-02-27 ENCOUNTER — Other Ambulatory Visit (HOSPITAL_COMMUNITY): Payer: Self-pay

## 2023-02-28 ENCOUNTER — Other Ambulatory Visit: Payer: Self-pay

## 2023-03-03 ENCOUNTER — Telehealth: Payer: Self-pay

## 2023-03-03 ENCOUNTER — Other Ambulatory Visit: Payer: Self-pay

## 2023-03-03 NOTE — Telephone Encounter (Signed)
Resubmitted Ozempic prior authorization today via CoverMyMeds Key: [id number]

## 2023-03-05 ENCOUNTER — Other Ambulatory Visit (HOSPITAL_COMMUNITY): Payer: Self-pay

## 2023-03-05 NOTE — Telephone Encounter (Signed)
Called [employer] to follow up on appeal for Ozempic. Appeal pending & decision determination should be made by 03/09/23.  Case [id number] (newer case number)

## 2023-03-07 ENCOUNTER — Encounter: Payer: Self-pay | Admitting: Nurse Practitioner

## 2023-03-07 ENCOUNTER — Ambulatory Visit: Payer: BC Managed Care – PPO | Attending: Nurse Practitioner | Admitting: Nurse Practitioner

## 2023-03-07 VITALS — BP 114/72 | HR 61 | Ht 64.0 in | Wt 228.8 lb

## 2023-03-07 DIAGNOSIS — I10 Essential (primary) hypertension: Secondary | ICD-10-CM | POA: Diagnosis not present

## 2023-03-07 DIAGNOSIS — G44209 Tension-type headache, unspecified, not intractable: Secondary | ICD-10-CM | POA: Diagnosis not present

## 2023-03-07 DIAGNOSIS — E785 Hyperlipidemia, unspecified: Secondary | ICD-10-CM | POA: Diagnosis not present

## 2023-03-07 MED ORDER — TOPIRAMATE 50 MG PO TABS: 25.0000 mg | ORAL_TABLET | Freq: Two times a day (BID) | ORAL | 3 refills | Status: DC

## 2023-03-07 MED ORDER — ROSUVASTATIN CALCIUM 10 MG PO TABS
10.0000 mg | ORAL_TABLET | Freq: Every day | ORAL | 3 refills | Status: DC
Start: 2023-03-07 — End: 2023-12-04

## 2023-03-07 NOTE — Telephone Encounter (Signed)
Prior Auth for patients medication OZEMPIC approved by [facility] from 03/03/23 to 03/02/24.  (Scanned approval letter to pt media)

## 2023-03-07 NOTE — Progress Notes (Unsigned)
Chest pain due to heartburn.

## 2023-03-07 NOTE — Progress Notes (Unsigned)
Assessment & Plan:  Angela Castillo was seen today for hypertension.  Diagnoses and all orders for this visit:  Primary hypertension Well controlled. Continue all antihypertensives as prescribed.  Reminded to bring in blood pressure log for follow  up appointment.  RECOMMENDATIONS: DASH/Mediterranean Diets are healthier choices for HTN.    Dyslipidemia, goal LDL below 70 -     rosuvastatin (CRESTOR) 10 MG tablet; Take 1 tablet (10 mg total) by mouth daily.   Chronic headaches -     topiramate (TOPAMAX) 50 MG tablet; Take 0.5 tablets (25 mg total) by mouth 2 (two) times daily. For headaches    Patient has been counseled on age-appropriate routine health concerns for screening and prevention. These are reviewed and up-to-date. Referrals have been placed accordingly. Immunizations are up-to-date or declined.    Subjective:   Chief Complaint  Patient presents with   Hypertension   HPI Angela Castillo 47 y.o. female presents to office today for follow up to HTN.  She has a past medical history of Anemia (Dx January 03 2015), DM2, Elevated liver enzymes, Essential hypertension (Dx Apr 2016), Fatty liver, H/O: upper GI bleed (03/2019), Headache, History of blood transfusion (03/2019), Liver cirrhosis secondary to NASH, Neuropathy, Pneumonia (03/2019), Reflux, and Thrombocytopenia (HCC).    HTN Blood pressure is well controlled.  BP Readings from Last 3 Encounters:  03/07/23 114/72  02/18/23 124/83  12/31/22 130/79    GERD Endorses epigastric pain. Recently started on Nexium by GI.    Endorses daily occurring headaches. She also has a history of cervical and back pain.  Eye exams have been normal. Headaches occuring Frontal, occipital and temporal. Taking mutliple doses of continued release tylenol. he has never taken imitrex but is currently taking elavil. She has chronic left hand numbness.    Currently recovering from a fall on 02-26-2023. She was running up a hill behind  her dogs and tripped falling forward but has been experiencing pain in the lower back, buttocks and legs. Review of Systems  Constitutional:  Negative for fever, malaise/fatigue and weight loss.  HENT: Negative.  Negative for nosebleeds.   Eyes: Negative.  Negative for blurred vision, double vision and photophobia.  Respiratory: Negative.  Negative for cough and shortness of breath.   Cardiovascular: Negative.  Negative for chest pain, palpitations and leg swelling.  Gastrointestinal:  Positive for heartburn. Negative for nausea and vomiting.  Musculoskeletal: Negative.  Negative for myalgias.  Neurological:  Positive for headaches. Negative for dizziness, focal weakness and seizures.  Psychiatric/Behavioral: Negative.  Negative for suicidal ideas.     Past Medical History:  Diagnosis Date   Anemia Dx January 03 2015   Diabetes mellitus without complication (HCC)    Elevated liver enzymes    Essential hypertension Dx Apr 2016   Fatty liver    H/O: upper GI bleed 03/2019    variceal bleed    Headache    History of blood transfusion 03/2019   Liver cirrhosis secondary to NASH (HCC)    Neuropathy    Pneumonia 03/2019   Reflux    Thrombocytopenia (HCC)     Past Surgical History:  Procedure Laterality Date   DILITATION & CURRETTAGE/HYSTROSCOPY WITH HYDROTHERMAL ABLATION N/A 11/20/2021   Procedure: DILATATION & CURETTAGE/HYSTEROSCOPY WITH HYDROTHERMAL ABLATION;  Surgeon: Reva Bores, MD;  Location: [facility];  Service: Gynecology;  Laterality: N/A;  rep will be here confirmed on 11/12/21 CS   ESOPHAGEAL BANDING  03/22/2019   Procedure: ESOPHAGEAL BANDING;  Surgeon: Bosie Clos,  Oswaldo Done, MD;  Location: [facility];  Service: Endoscopy;;   ESOPHAGEAL BANDING  03/23/2019   Procedure: ESOPHAGEAL BANDING;  Surgeon: Charlott Rakes, MD;  Location: [facility];  Service: Endoscopy;;   ESOPHAGEAL BANDING  09/20/2019   Procedure: ESOPHAGEAL BANDING;  Surgeon: Charlott Rakes, MD;  Location: [facility];  Service: Endoscopy;;   ESOPHAGEAL BANDING N/A 12/21/2019   Procedure: ESOPHAGEAL BANDING;  Surgeon: Charlott Rakes, MD;  Location: [facility];  Service: Endoscopy;  Laterality: N/A;   ESOPHAGOGASTRODUODENOSCOPY N/A 03/22/2019   Procedure: ESOPHAGOGASTRODUODENOSCOPY (EGD);  Surgeon: Charlott Rakes, MD;  Location: [facility];  Service: Endoscopy;  Laterality: N/A;   ESOPHAGOGASTRODUODENOSCOPY N/A 03/23/2019   Procedure: ESOPHAGOGASTRODUODENOSCOPY (EGD);  Surgeon: Charlott Rakes, MD;  Location: [facility];  Service: Endoscopy;  Laterality: N/A;   ESOPHAGOGASTRODUODENOSCOPY (EGD) WITH PROPOFOL N/A 07/23/2019   Procedure: ESOPHAGOGASTRODUODENOSCOPY (EGD) WITH PROPOFOL with possible banding;  Surgeon: Charlott Rakes, MD;  [facility];  Service: Endoscopy;  Laterality: N/A;   ESOPHAGOGASTRODUODENOSCOPY (EGD) WITH PROPOFOL N/A 09/20/2019   Procedure: ESOPHAGOGASTRODUODENOSCOPY (EGD) WITH PROPOFOL;  Surgeon: Charlott Rakes, MD;  Location: [facility];  Service: Endoscopy;  Laterality: N/A;   ESOPHAGOGASTRODUODENOSCOPY (EGD) WITH PROPOFOL N/A 12/21/2019   Procedure: ESOPHAGOGASTRODUODENOSCOPY (EGD) WITH PROPOFOL with possible banding;  Surgeon: Charlott Rakes, MD;  Location: [facility];  Service: Endoscopy;  Laterality: N/A;   HERNIA REPAIR     TUBAL LIGATION  07/25/2000   VENTRAL HERNIA REPAIR N/A 07/06/2020   Procedure: VENTRAL HERNIA REPAIR WITH MESH;  Surgeon: Abigail Miyamoto, MD;  [facility];  Service: General;  Laterality: N/A;    Family History  Problem Relation Age of Onset   Diabetes Father    COPD Mother    Esophageal cancer Mother    Diabetes Mother    Cancer Mother        esophageal   Hypertension Mother    Breast cancer Maternal Aunt     Social History Reviewed with no changes to be made today.   Outpatient Medications Prior to Visit  Medication Sig Dispense Refill   Accu-Chek Softclix Lancets lancets USE TO CHECK BLOOD  SUGAR THREE TIMES DAILY 300 each 0   acetaminophen (TYLENOL) 500 MG tablet Take 500-1,000 mg by mouth every 6 (six) hours as needed for mild pain.     amitriptyline (ELAVIL) 25 MG tablet TAKE 1 TABLET BY MOUTH AT BEDTIME 90 tablet 1   Azelastine-Fluticasone 137-50 MCG/ACT SUSP PLACE 1 SPRAY IN NOSTRIL(S) EVERY 12 HOURS 23 g 0   Biotin 82956 MCG TABS Take 10,000 mcg by mouth daily.     blood glucose meter kit and supplies KIT Dispense based on patient and insurance preference. Use up to four times daily as directed. (FOR ICD-9 250.00, 250.01). Dispense Relion (Patient taking differently: Dispense based on patient and insurance preference. Use up to four times daily as directed. (FOR ICD-9 250.00, 250.01). Dispense Relion as needed) 1 each 0   Blood Glucose Monitoring Suppl (ACCU-CHEK GUIDE) w/Device KIT Use to check blood sugar three times daily. 1 kit 0   ciclopirox (PENLAC) 8 % solution Apply topically at bedtime. Apply over nail and surrounding skin. Apply daily over previous coat. After seven (7) days, may remove with alcohol and continue cycle. 6.6 mL 0   dapagliflozin propanediol (FARXIGA) 5 MG TABS tablet TAKE 1 TABLET BY MOUTH EVERY DAY BEFORE BREAKFAST 90 tablet 0   esomeprazole (NEXIUM) 40 MG capsule Take 40 mg by mouth 2 (two) times daily before a meal.  ferrous sulfate 325 (65 FE) MG tablet TAKE 1 TABLET BY MOUTH TWICE A DAY WITH FOOD 180 tablet 0   glucose blood (ACCU-CHEK GUIDE) test strip USE TO CHECK BLOOD SUGAR THREE TIMES DAILY 300 strip 0   ketoconazole (NIZORAL) 2 % cream Apply 1 application  topically daily as needed for irritation (as needed).     loratadine (CLARITIN) 10 MG tablet TAKE 1 TABLET BY MOUTH EVERY DAY (Patient taking differently: Take 10 mg by mouth daily as needed (as needed).) 90 tablet 1   losartan-hydrochlorothiazide (HYZAAR) 50-12.5 MG tablet TAKE 1 TABLET BY MOUTH EVERY DAY 90 tablet 1   Menthol, Topical Analgesic, (ICY HOT MEDICATED SPRAY) 16 % LIQD Apply  1 spray topically daily as needed (pain).     metFORMIN (GLUCOPHAGE) 500 MG tablet TAKE 1 TABLET BY MOUTH 2 TIMES DAILY WITH A MEAL. 180 tablet 1   methocarbamol (ROBAXIN) 500 MG tablet TAKE 1 TABLET BY MOUTH EVERY 8 HOURS AS NEEDED FOR MUSCLE SPASM 60 tablet 0   Microlet Lancets MISC USE UP TO FOUR TIMES DAILY AS DIRECTED 100 each 11   mupirocin ointment (BACTROBAN) 2 % Apply topically 2 (two) times daily. 22 g 0   omega-3 acid ethyl esters (LOVAZA) 1 g capsule Take 1 capsule (1 g total) by mouth 2 (two) times daily. 180 capsule 1   ondansetron (ZOFRAN-ODT) 4 MG disintegrating tablet Take 1 tablet (4 mg total) by mouth every 8 (eight) hours as needed for vomiting or nausea. 30 tablet 1   propranolol (INDERAL) 20 MG tablet TAKE 1 TABLET BY MOUTH TWICE A DAY 180 tablet 0   Semaglutide,0.25 or 0.5MG /DOS, (OZEMPIC, 0.25 OR 0.5 MG/DOSE,) 2 MG/3ML SOPN Inject 0.5 mg into the skin once a week. 9 mL 1   sucralfate (CARAFATE) 1 GM/10ML suspension Take 1 g by mouth 4 (four) times daily -  with meals and at bedtime.     triamcinolone (KENALOG) 0.025 % ointment Apply 1 Application topically 2 (two) times daily. 30 g 1   vitamin C (ASCORBIC ACID) 500 MG tablet Take 500 mg by mouth 2 (two) times daily.     omeprazole (PRILOSEC) 40 MG capsule Take 40 mg by mouth daily.     rosuvastatin (CRESTOR) 10 MG tablet Take 1 tablet (10 mg total) by mouth daily. 90 tablet 3   pantoprazole (PROTONIX) 40 MG tablet TAKE 1 TABLET BY MOUTH EVERY DAY (Patient not taking: Reported on 03/07/2023) 90 tablet 1   No facility-administered medications prior to visit.    Allergies  Allergen Reactions   Nsaids     GI BLEED       Objective:    BP 114/72 (BP Location: Left Arm, Patient Position: Sitting, Cuff Size: Normal)   Pulse 61   Ht 5\' 4"  (1.626 m)   Wt 228 lb 12.8 oz (103.8 kg)   SpO2 98%   BMI 39.27 kg/m  Wt Readings from Last 3 Encounters:  03/07/23 228 lb 12.8 oz (103.8 kg)  02/18/23 230 lb 8 oz (104.6 kg)   12/31/22 229 lb 9.6 oz (104.1 kg)    Physical Exam Vitals and nursing note reviewed.  Constitutional:      Appearance: She is well-developed.  HENT:     Head: Normocephalic and atraumatic.  Cardiovascular:     Rate and Rhythm: Normal rate and regular rhythm.     Heart sounds: Normal heart sounds. No murmur heard.    No friction rub. No gallop.  Pulmonary:  Effort: Pulmonary effort is normal. No tachypnea or respiratory distress.     Breath sounds: Normal breath sounds. No decreased breath sounds, wheezing, rhonchi or rales.  Chest:     Chest wall: No tenderness.  Abdominal:     General: Bowel sounds are normal.     Palpations: Abdomen is soft.  Musculoskeletal:        General: Normal range of motion.     Cervical back: Normal range of motion.  Skin:    General: Skin is warm and dry.  Neurological:     Mental Status: She is alert and oriented to person, place, and time.     Coordination: Coordination normal.  Psychiatric:        Behavior: Behavior normal. Behavior is cooperative.        Thought Content: Thought content normal.        Judgment: Judgment normal.          Patient has been counseled extensively about nutrition and exercise as well as the importance of adherence with medications and regular follow-up. The patient was given clear instructions to go to ER or return to medical center if symptoms don't improve, worsen or new problems develop. The patient verbalized understanding.   Follow-up: Return in about 3 months (around 06/07/2023) for physical .   Claiborne Rigg, FNP-BC [facility] and [facility] [place], [place] [phone]   03/10/2023, 1:10 PM

## 2023-03-10 ENCOUNTER — Other Ambulatory Visit: Payer: Self-pay

## 2023-03-10 ENCOUNTER — Encounter: Payer: Self-pay | Admitting: Nurse Practitioner

## 2023-03-13 ENCOUNTER — Ambulatory Visit (INDEPENDENT_AMBULATORY_CARE_PROVIDER_SITE_OTHER): Payer: BC Managed Care – PPO | Admitting: Podiatry

## 2023-03-13 DIAGNOSIS — M7752 Other enthesopathy of left foot: Secondary | ICD-10-CM | POA: Diagnosis not present

## 2023-03-13 MED ORDER — TRIAMCINOLONE ACETONIDE 10 MG/ML IJ SUSP
10.0000 mg | Freq: Once | INTRAMUSCULAR | Status: AC
Start: 2023-03-13 — End: 2023-03-13
  Administered 2023-03-13: 10 mg

## 2023-03-13 NOTE — Patient Instructions (Signed)

## 2023-03-19 NOTE — Progress Notes (Signed)
Subjective: Chief Complaint  Patient presents with   Ankle Pain    Ankle pain to the lateral aspect of the right ankle.    Follow-up    Right nail check     47 year old female presents the office today for follow-up evaluation after undergoing partial nail avulsion.  She states that the toe is doing much better.  No soreness or any drainage.  No significant pain.  She still can discomfort on the anterior medial aspect ankle which she recently started to get some discomfort to the lateral aspect of the ankle as well.  No recent injury or changes otherwise that she reports.    Objective: AAO x3, NAD DP/PT pulses palpable bilaterally, CRT less than 3 seconds Status post nail removal of the right hallux which appears to be healing well.  There is no surrounding erythema, ascending cellulitis.  There is no drainage or pus or any obvious signs of infection today. On the anterior aspect ankle was localized tenderness on the ankle gutter.  Some edema is present.  There is also a fluid-filled type mass on the anterior lateral ankle.  She started to get some discomfort on the lateral aspect of the ankle, sinus tarsi as well.  No.  Pinpoint tenderness.  Ankle range of motion intact.  No pain with calf compression, swelling, warmth, erythema  Assessment: Ingrown toenail right third nail border-much improved; capsulitis  Plan: -All treatment options discussed with the patient including all alternatives, risks, complications.  -Toenail doing much better.  Continue monitor any reoccurrence or any signs or symptoms of infection. -Steroid injection performed to the left ankle.  The skin was cleaned with Betadine, alcohol and a mixture of 1 cc Kenalog 10, 0.5 cc of Marcaine plain, 0.5 cc of lidocaine plain was infiltrated into the anterior medial aspect of the ankle joint without complications.  Postinjection care discussed.  Tolerated the injection well.  Bracing as needed.  Continue supportive shoe  gear.  Vivi Barrack DPM

## 2023-03-20 ENCOUNTER — Encounter: Payer: Self-pay | Admitting: Nurse Practitioner

## 2023-03-21 ENCOUNTER — Other Ambulatory Visit: Payer: Self-pay | Admitting: Nurse Practitioner

## 2023-03-21 DIAGNOSIS — R519 Headache, unspecified: Secondary | ICD-10-CM

## 2023-04-07 ENCOUNTER — Other Ambulatory Visit: Payer: Self-pay | Admitting: Family Medicine

## 2023-04-07 ENCOUNTER — Ambulatory Visit
Admission: RE | Admit: 2023-04-07 | Discharge: 2023-04-07 | Disposition: A | Discharge status: Home / Self Care | Payer: BC Managed Care – PPO | Source: Ambulatory Visit | Attending: Gastroenterology | Admitting: Gastroenterology

## 2023-04-07 DIAGNOSIS — R161 Splenomegaly, not elsewhere classified: Secondary | ICD-10-CM | POA: Diagnosis not present

## 2023-04-07 DIAGNOSIS — K7460 Unspecified cirrhosis of liver: Secondary | ICD-10-CM | POA: Diagnosis not present

## 2023-04-07 DIAGNOSIS — K8020 Calculus of gallbladder without cholecystitis without obstruction: Secondary | ICD-10-CM | POA: Diagnosis not present

## 2023-04-07 DIAGNOSIS — Z79899 Other long term (current) drug therapy: Secondary | ICD-10-CM | POA: Diagnosis not present

## 2023-04-07 DIAGNOSIS — Z049 Encounter for examination and observation for unspecified reason: Secondary | ICD-10-CM | POA: Diagnosis not present

## 2023-04-07 DIAGNOSIS — G43719 Chronic migraine without aura, intractable, without status migrainosus: Secondary | ICD-10-CM | POA: Diagnosis not present

## 2023-04-07 DIAGNOSIS — D508 Other iron deficiency anemias: Secondary | ICD-10-CM

## 2023-04-08 NOTE — Telephone Encounter (Signed)
Requested medication (s) are due for refill today: Due 04/10/23  Requested medication (s) are on the active medication list: yes    Last refill: 01/08/23  #180  0 refills  Future visit scheduled yes  06/09/23  Notes to clinic:Failed due to labs, please review. Thank you.  Requested Prescriptions  Pending Prescriptions Disp Refills   ferrous sulfate 325 (65 FE) MG tablet [Pharmacy Med Name: FERROUS SULFATE 325 MG TABLET] 180 tablet 0    Sig: TAKE 1 TABLET BY MOUTH TWICE A DAY WITH FOOD     Endocrinology:  Minerals - Iron Supplementation Failed - 04/07/2023  2:12 AM      Failed - Fe (serum) in normal range and within 360 days    Iron  Date Value Ref Range Status  01/03/2015 11 (L) 42 - 145 ug/dL Final   Saturation Ratios  Date Value Ref Range Status  01/03/2015 2 (L) 20 - 55 % Final         Failed - Ferritin in normal range and within 360 days    Ferritin  Date Value Ref Range Status  01/03/2015 12 10 - 291 ng/mL Final    Comment:    Performed at [facility]         Passed - HGB in normal range and within 360 days    Hemoglobin  Date Value Ref Range Status  12/31/2022 13.8 12.0 - 15.0 g/dL Final  16/08/9603 54.0 11.1 - 15.9 g/dL Final         Passed - HCT in normal range and within 360 days    HCT  Date Value Ref Range Status  12/31/2022 41.6 36.0 - 46.0 % Final   Hematocrit  Date Value Ref Range Status  12/06/2022 41.4 34.0 - 46.6 % Final         Passed - RBC in normal range and within 360 days    RBC  Date Value Ref Range Status  12/31/2022 4.56 3.87 - 5.11 MIL/uL Final         Passed - Valid encounter within last 12 months    Recent Outpatient Visits           1 month ago Primary hypertension   [place] [facility] [place], [place] W, NP   4 months ago Type 2 diabetes mellitus with unspecified complications [facility])   [place] [facility] [place], Shea Stakes, NP   7 months ago Encounter for Papanicolaou  smear for cervical cancer screening   [place] [facility] [place], [place] W, NP   10 months ago Type 2 diabetes mellitus without complication, without long-term current use of insulin [facility])   [place] [facility] [place], Shea Stakes, NP   1 year ago Chronic cough    [facility] [place], Shea Stakes, NP       Future Appointments             In 2 months Claiborne Rigg, NP [facility]

## 2023-04-15 DIAGNOSIS — M542 Cervicalgia: Secondary | ICD-10-CM | POA: Diagnosis not present

## 2023-04-15 DIAGNOSIS — G43719 Chronic migraine without aura, intractable, without status migrainosus: Secondary | ICD-10-CM | POA: Diagnosis not present

## 2023-04-19 ENCOUNTER — Other Ambulatory Visit: Payer: Self-pay | Admitting: Family Medicine

## 2023-04-19 DIAGNOSIS — E119 Type 2 diabetes mellitus without complications: Secondary | ICD-10-CM

## 2023-04-21 NOTE — Telephone Encounter (Signed)
Requested Prescriptions  Pending Prescriptions Disp Refills   FARXIGA 5 MG TABS tablet [Pharmacy Med Name: FARXIGA 5 MG TABLET] 90 tablet 1    Sig: TAKE 1 TABLET BY MOUTH EVERY DAY BEFORE BREAKFAST     Endocrinology:  Diabetes - SGLT2 Inhibitors Passed - 04/19/2023  8:44 AM      Passed - Cr in normal range and within 360 days    Creat  Date Value Ref Range Status  06/21/2016 0.68 0.50 - 1.10 mg/dL Final   Creatinine, Ser  Date Value Ref Range Status  12/06/2022 0.63 0.57 - 1.00 mg/dL Final         Passed - HBA1C is between 0 and 7.9 and within 180 days    HbA1c, POC (controlled diabetic range)  Date Value Ref Range Status  12/06/2022 5.9 0.0 - 7.0 % Final         Passed - eGFR in normal range and within 360 days    GFR, Est African American  Date Value Ref Range Status  06/21/2016 >89 >=60 mL/min Final   GFR calc Af Amer  Date Value Ref Range Status  11/07/2020 125 >59 mL/min/1.73 Final    Comment:    **In accordance with recommendations from the NKF-ASN Task force,**   [facility] is in the process of updating its eGFR calculation to the   2021 CKD-EPI creatinine equation that estimates kidney function   without a race variable.    GFR, Est Non African American  Date Value Ref Range Status  06/21/2016 >89 >=60 mL/min Final   GFR, Estimated  Date Value Ref Range Status  11/20/2021 >60 >60 mL/min Final    Comment:    (NOTE) Calculated using the CKD-EPI Creatinine Equation (2021)    eGFR  Date Value Ref Range Status  12/06/2022 111 >59 mL/min/1.73 Final         Passed - Valid encounter within last 6 months    Recent Outpatient Visits           1 month ago Primary hypertension   [place] [facility] [place], [place] W, NP   4 months ago Type 2 diabetes mellitus with unspecified complications [facility])   [place] [facility] [place], Shea Stakes, NP   8 months ago Encounter for Papanicolaou smear for cervical cancer  screening   [place] [facility] [place], [place] W, NP   10 months ago Type 2 diabetes mellitus without complication, without long-term current use of insulin [facility])   [place] [facility] [place], Shea Stakes, NP   1 year ago Chronic cough   [place] [facility] Claiborne Rigg, NP       Future Appointments             In 1 month Claiborne Rigg, NP [facility]

## 2023-04-25 ENCOUNTER — Other Ambulatory Visit: Payer: Self-pay | Admitting: Nurse Practitioner

## 2023-04-28 ENCOUNTER — Other Ambulatory Visit (HOSPITAL_COMMUNITY): Payer: Self-pay

## 2023-04-28 MED ORDER — MUPIROCIN 2 % EX OINT
TOPICAL_OINTMENT | Freq: Two times a day (BID) | CUTANEOUS | 0 refills | Status: DC
  Filled 2023-04-28: qty 22, 10d supply, fill #0

## 2023-04-28 NOTE — Telephone Encounter (Signed)
Requested medication (s) are due for refill today: yes  Requested medication (s) are on the active medication list: yes  Last refill:  07/29/22  Future visit scheduled: yes  Notes to clinic:   Medication not assigned to a protocol, review manually.      Requested Prescriptions  Pending Prescriptions Disp Refills   mupirocin ointment (BACTROBAN) 2 % 22 g 0    Sig: Apply topically 2 (two) times daily.     Off-Protocol Failed - 04/25/2023  7:28 PM      Failed - Medication not assigned to a protocol, review manually.      Passed - Valid encounter within last 12 months    Recent Outpatient Visits           1 month ago Primary hypertension   [place] [facility] [place], [place] W, NP   4 months ago Type 2 diabetes mellitus with unspecified complications [facility])   [place] [facility] [place], Shea Stakes, NP   8 months ago Encounter for Papanicolaou smear for cervical cancer screening   [place] [facility] [place], [place] W, NP   10 months ago Type 2 diabetes mellitus without complication, without long-term current use of insulin [facility])   [place] [facility] [place], Shea Stakes, NP   1 year ago Chronic cough   [place] [facility] Claiborne Rigg, NP       Future Appointments             In 1 month Claiborne Rigg, NP [facility]

## 2023-04-29 ENCOUNTER — Other Ambulatory Visit: Payer: Self-pay

## 2023-04-29 DIAGNOSIS — G43719 Chronic migraine without aura, intractable, without status migrainosus: Secondary | ICD-10-CM | POA: Diagnosis not present

## 2023-04-29 DIAGNOSIS — M542 Cervicalgia: Secondary | ICD-10-CM | POA: Diagnosis not present

## 2023-04-30 ENCOUNTER — Other Ambulatory Visit (HOSPITAL_COMMUNITY): Payer: Self-pay

## 2023-04-30 ENCOUNTER — Other Ambulatory Visit: Payer: Self-pay

## 2023-05-01 ENCOUNTER — Other Ambulatory Visit: Payer: Self-pay

## 2023-05-05 ENCOUNTER — Emergency Department (HOSPITAL_COMMUNITY)
Admission: EM | Admit: 2023-05-05 | Discharge: 2023-05-05 | Disposition: A | Discharge status: Home / Self Care | Payer: BC Managed Care – PPO | Attending: Emergency Medicine | Admitting: Emergency Medicine

## 2023-05-05 ENCOUNTER — Emergency Department (HOSPITAL_COMMUNITY): Payer: BC Managed Care – PPO

## 2023-05-05 DIAGNOSIS — I10 Essential (primary) hypertension: Secondary | ICD-10-CM | POA: Insufficient documentation

## 2023-05-05 DIAGNOSIS — Z7984 Long term (current) use of oral hypoglycemic drugs: Secondary | ICD-10-CM | POA: Insufficient documentation

## 2023-05-05 DIAGNOSIS — Z79899 Other long term (current) drug therapy: Secondary | ICD-10-CM | POA: Diagnosis not present

## 2023-05-05 DIAGNOSIS — E119 Type 2 diabetes mellitus without complications: Secondary | ICD-10-CM | POA: Insufficient documentation

## 2023-05-05 DIAGNOSIS — R079 Chest pain, unspecified: Secondary | ICD-10-CM | POA: Diagnosis not present

## 2023-05-05 DIAGNOSIS — R0789 Other chest pain: Secondary | ICD-10-CM | POA: Insufficient documentation

## 2023-05-05 LAB — BASIC METABOLIC PANEL
Anion gap: 8 (ref 5–15)
BUN: 12 mg/dL (ref 6–20)
CO2: 26 mmol/L (ref 22–32)
Calcium: 9.6 mg/dL (ref 8.9–10.3)
Chloride: 104 mmol/L (ref 98–111)
Creatinine, Ser: 0.66 mg/dL (ref 0.44–1.00)
GFR, Estimated: >60 mL/min (ref >60)
Glucose, Bld: 126 mg/dL — ABNORMAL HIGH (ref 70–99)
Potassium: 3.7 mmol/L (ref 3.5–5.1)
Sodium: 138 mmol/L (ref 135–145)

## 2023-05-05 LAB — CBC
HCT: 44.7 % (ref 36.0–46.0)
Hemoglobin: 13.9 g/dL (ref 12.0–15.0)
MCH: 29.8 pg (ref 26.0–34.0)
MCHC: 31.1 g/dL (ref 30.0–36.0)
MCV: 95.9 fL (ref 80.0–100.0)
Platelets: 92 10*3/uL — ABNORMAL LOW (ref 150–400)
RBC: 4.66 MIL/uL (ref 3.87–5.11)
RDW: 14.4 % (ref 11.5–15.5)
WBC: 6.6 10*3/uL (ref 4.0–10.5)
nRBC: 0 % (ref 0.0–0.2)

## 2023-05-05 LAB — TROPONIN I (HIGH SENSITIVITY): Troponin I (High Sensitivity): 6 ng/L (ref <18)

## 2023-05-05 NOTE — ED Triage Notes (Signed)
Pt reports chest wall pain x 1.5 weeks. Reports hx of GERD. Takes Carafate. Denies N/V/SHOB. No s/s of acute distress noted in triage.

## 2023-05-05 NOTE — Discharge Instructions (Addendum)
We evaluated you for your chest pain.  Your testing including cardiac enzymes, chest x-ray and laboratory testing was reassuring.  Your EKG did not show any signs of a heart attack.  I do not think your symptoms are caused by a dangerous problem such as a heart attack, pneumonia, blood clot, or other issue.  Your symptoms are most likely caused by pain in the chest wall, muscles, bones, and ligaments that support your chest.  Please take 1000 mg of Tylenol every 6 hours as needed for your pain.  Please also try heat pack or ice pack to help with your symptoms.  It is okay to take some ibuprofen but I would only take it for a day or 2 given your history of stomach issues.  You can take 600 mg of ibuprofen every 6 hours.  Although I do not think your symptoms are due to a heart attack, I would still like you to follow-up with cardiology given your history of diabetes and high blood pressure.  Please follow-up also with your primary doctor.  Please return if you have any new symptoms such as severe pain, fainting, difficulty breathing, vomiting, sweating, or any other concerning symptoms.

## 2023-05-05 NOTE — ED Provider Notes (Signed)
[place] EMERGENCY DEPARTMENT AT [facility] Provider Note  CSN: [id number] Arrival date & time: 05/05/23 1411  Chief Complaint(s) Chest Pain  HPI Angela Castillo is a 47 y.o. female history of diabetes, cirrhosis, hypertension, hyperlipidemia presenting to the emergency department with chest pain.  She reports the chest pain is worse with movement.  Denies associated shortness of breath, nausea or vomiting, lightheadedness or dizziness, fainting, diaphoresis, cough, back pain, abdominal pain.  She reports the pain has been present for around 1-1/2 weeks.  Pain not pleuritic, not exertional.  Has not taken anything for her symptoms.   Past Medical History Past Medical History:  Diagnosis Date   Anemia Dx January 03 2015   Diabetes mellitus without complication (HCC)    Elevated liver enzymes    Essential hypertension Dx Apr 2016   Fatty liver    H/O: upper GI bleed 03/2019    variceal bleed    Headache    History of blood transfusion 03/2019   Liver cirrhosis secondary to NASH (HCC)    Neuropathy    Pneumonia 03/2019   Reflux    Thrombocytopenia (HCC)    Patient Active Problem List   Diagnosis Date Noted   Paresthesia 02/18/2023   Neck pain 02/18/2023   Abnormal uterine bleeding 11/20/2021   Menorrhagia 11/01/2021   Ingrown toenail 10/16/2019   Cirrhosis (HCC) 07/23/2019   Diabetes mellitus (HCC) 04/20/2019   Acute respiratory insufficiency    H/O: upper GI bleed 03/21/2019   Impingement syndrome of left shoulder 04/21/2017   Low back pain 03/18/2016   Left shoulder pain 03/18/2016   Esophageal reflux 03/18/2016   Lateral pain of left hip 10/25/2015   Heel pain, bilateral 05/03/2015   Obesity (BMI 30-39.9) 05/03/2015   Hearing loss in right ear 03/01/2015   Family history of diabetes mellitus in mother 03/01/2015   Essential hypertension 01/18/2015   Anemia, iron deficiency 01/04/2015   Musculoskeletal chest pain 01/04/2015   Thrombocytopenia (HCC)  01/04/2015   Transaminitis 01/03/2015   Home Medication(s) Prior to Admission medications   Medication Sig Start Date End Date Taking? Authorizing Provider  Accu-Chek Softclix Lancets lancets USE TO CHECK BLOOD SUGAR THREE TIMES DAILY 12/04/22   Hoy Register, MD  acetaminophen (TYLENOL) 500 MG tablet Take 500-1,000 mg by mouth every 6 (six) hours as needed for mild pain.    [provider]  amitriptyline (ELAVIL) 25 MG tablet TAKE 1 TABLET BY MOUTH AT BEDTIME 01/06/23   Hoy Register, MD  Azelastine-Fluticasone 137-50 MCG/ACT SUSP PLACE 1 SPRAY IN NOSTRIL(S) EVERY 12 HOURS 02/25/22   Hoy Register, MD  Biotin 86578 MCG TABS Take 10,000 mcg by mouth daily.    [provider]  blood glucose meter kit and supplies KIT Dispense based on patient and insurance preference. Use up to four times daily as directed. (FOR ICD-9 250.00, 250.01). Dispense Relion Patient taking differently: Dispense based on patient and insurance preference. Use up to four times daily as directed. (FOR ICD-9 250.00, 250.01). Dispense Relion as needed 03/29/19   Marguerita Merles Latif, DO  Blood Glucose Monitoring Suppl (ACCU-CHEK GUIDE) w/Device KIT Use to check blood sugar three times daily. 05/01/22   Hoy Register, MD  ciclopirox (PENLAC) 8 % solution Apply topically at bedtime. Apply over nail and surrounding skin. Apply daily over previous coat. After seven (7) days, may remove with alcohol and continue cycle. 01/23/23   Vivi Barrack, DPM  dapagliflozin propanediol (FARXIGA) 5 MG TABS tablet TAKE 1 TABLET  BY MOUTH EVERY DAY BEFORE BREAKFAST 04/21/23   Claiborne Rigg, NP  esomeprazole (NEXIUM) 40 MG capsule Take 40 mg by mouth 2 (two) times daily before a meal. 03/05/23   [provider]  ferrous sulfate 325 (65 FE) MG tablet TAKE 1 TABLET BY MOUTH TWICE A DAY WITH FOOD 04/08/23   Hoy Register, MD  glucose blood (ACCU-CHEK GUIDE) test strip USE TO CHECK BLOOD SUGAR THREE TIMES DAILY 12/04/22    Hoy Register, MD  ketoconazole (NIZORAL) 2 % cream Apply 1 application  topically daily as needed for irritation (as needed).    [provider]  loratadine (CLARITIN) 10 MG tablet TAKE 1 TABLET BY MOUTH EVERY DAY Patient taking differently: Take 10 mg by mouth daily as needed (as needed). 05/28/22   Claiborne Rigg, NP  losartan-hydrochlorothiazide [facility]) 50-12.5 MG tablet TAKE 1 TABLET BY MOUTH EVERY DAY 10/25/22   Claiborne Rigg, NP  Menthol, Topical Analgesic, (ICY HOT MEDICATED SPRAY) 16 % LIQD Apply 1 spray topically daily as needed (pain).    [provider]  metFORMIN (GLUCOPHAGE) 500 MG tablet TAKE 1 TABLET BY MOUTH 2 TIMES DAILY WITH A MEAL. 10/25/22   Claiborne Rigg, NP  methocarbamol (ROBAXIN) 500 MG tablet TAKE 1 TABLET BY MOUTH EVERY 8 HOURS AS NEEDED FOR MUSCLE SPASM 02/17/23   Hoy Register, MD  Microlet Lancets MISC USE UP TO FOUR TIMES DAILY AS DIRECTED 04/11/21   Claiborne Rigg, NP  mupirocin ointment (BACTROBAN) 2 % Apply topically 2 (two) times daily. 04/28/23   Hoy Register, MD  omega-3 acid ethyl esters (LOVAZA) 1 g capsule Take 1 capsule (1 g total) by mouth 2 (two) times daily. 11/24/22   Claiborne Rigg, NP  ondansetron (ZOFRAN-ODT) 4 MG disintegrating tablet Take 1 tablet (4 mg total) by mouth every 8 (eight) hours as needed for vomiting or nausea. 06/10/22   Claiborne Rigg, NP  propranolol (INDERAL) 20 MG tablet TAKE 1 TABLET BY MOUTH TWICE A DAY 02/13/23   Claiborne Rigg, NP  rosuvastatin (CRESTOR) 10 MG tablet Take 1 tablet (10 mg total) by mouth daily. 03/07/23   Claiborne Rigg, NP  Semaglutide,0.25 or 0.5MG /DOS, (OZEMPIC, 0.25 OR 0.5 MG/DOSE,) 2 MG/3ML SOPN Inject 0.5 mg into the skin once a week. 02/21/23   Claiborne Rigg, NP  sucralfate (CARAFATE) 1 GM/10ML suspension Take 1 g by mouth 4 (four) times daily -  with meals and at bedtime.    [provider]  topiramate (TOPAMAX) 50 MG tablet Take 0.5 tablets (25 mg total) by  mouth 2 (two) times daily. For headaches 03/07/23   Claiborne Rigg, NP  triamcinolone (KENALOG) 0.025 % ointment Apply 1 Application topically 2 (two) times daily. 06/05/22   Claiborne Rigg, NP  vitamin C (ASCORBIC ACID) 500 MG tablet Take 500 mg by mouth 2 (two) times daily.    [provider]  Past Surgical History Past Surgical History:  Procedure Laterality Date   DILITATION & CURRETTAGE/HYSTROSCOPY WITH HYDROTHERMAL ABLATION N/A 11/20/2021   Procedure: DILATATION & CURETTAGE/HYSTEROSCOPY WITH HYDROTHERMAL ABLATION;  Surgeon: Reva Bores, MD;  Location: [facility] OR;  Service: Gynecology;  Laterality: N/A;  rep will be here confirmed on 11/12/21 CS   ESOPHAGEAL BANDING  03/22/2019   Procedure: ESOPHAGEAL BANDING;  Surgeon: Charlott Rakes, MD;  Location: [facility];  Service: Endoscopy;;   ESOPHAGEAL BANDING  03/23/2019   Procedure: ESOPHAGEAL BANDING;  Surgeon: Charlott Rakes, MD;  Location: [facility];  Service: Endoscopy;;   ESOPHAGEAL BANDING  09/20/2019   Procedure: ESOPHAGEAL BANDING;  Surgeon: Charlott Rakes, MD;  Location: [facility];  Service: Endoscopy;;   ESOPHAGEAL BANDING N/A 12/21/2019   Procedure: ESOPHAGEAL BANDING;  Surgeon: Charlott Rakes, MD;  Location: [facility];  Service: Endoscopy;  Laterality: N/A;   ESOPHAGOGASTRODUODENOSCOPY N/A 03/22/2019   Procedure: ESOPHAGOGASTRODUODENOSCOPY (EGD);  Surgeon: Charlott Rakes, MD;  Location: [facility];  Service: Endoscopy;  Laterality: N/A;   ESOPHAGOGASTRODUODENOSCOPY N/A 03/23/2019   Procedure: ESOPHAGOGASTRODUODENOSCOPY (EGD);  Surgeon: Charlott Rakes, MD;  Location: [facility];  Service: Endoscopy;  Laterality: N/A;   ESOPHAGOGASTRODUODENOSCOPY (EGD) WITH PROPOFOL N/A 07/23/2019   Procedure: ESOPHAGOGASTRODUODENOSCOPY (EGD) WITH PROPOFOL with possible banding;   Surgeon: Charlott Rakes, MD;  Location: [facility];  Service: Endoscopy;  Laterality: N/A;   ESOPHAGOGASTRODUODENOSCOPY (EGD) WITH PROPOFOL N/A 09/20/2019   Procedure: ESOPHAGOGASTRODUODENOSCOPY (EGD) WITH PROPOFOL;  Surgeon: Charlott Rakes, MD;  Location: [facility];  Service: Endoscopy;  Laterality: N/A;   ESOPHAGOGASTRODUODENOSCOPY (EGD) WITH PROPOFOL N/A 12/21/2019   Procedure: ESOPHAGOGASTRODUODENOSCOPY (EGD) WITH PROPOFOL with possible banding;  Surgeon: Charlott Rakes, MD;  Location: [facility];  Service: Endoscopy;  Laterality: N/A;   HERNIA REPAIR     TUBAL LIGATION  07/25/2000   VENTRAL HERNIA REPAIR N/A 07/06/2020   Procedure: VENTRAL HERNIA REPAIR WITH MESH;  Surgeon: Abigail Miyamoto, MD;  Location: [facility] OR;  Service: General;  Laterality: N/A;   Family History Family History  Problem Relation Age of Onset   Diabetes Father    COPD Mother    Esophageal cancer Mother    Diabetes Mother    Cancer Mother        esophageal   Hypertension Mother    Breast cancer Maternal Aunt     Social History Social History   Tobacco Use   Smoking status: Never   Smokeless tobacco: Never  Vaping Use   Vaping Use: Never used  Substance Use Topics   Alcohol use: Not Currently    Comment: rarely-last alcohol drank in 08/2018   Drug use: No   Allergies Nsaids  Review of Systems Review of Systems  All other systems reviewed and are negative.   Physical Exam Vital Signs  I have reviewed the triage vital signs BP 110/75 (BP Location: Right Arm)   Pulse 76   Temp 98.5 F (36.9 C) (Oral)   Resp 16   Ht 5\' 4"  (1.626 m)   Wt 98.9 kg   SpO2 98%   BMI 37.42 kg/m  Physical Exam Vitals and nursing note reviewed.  Constitutional:      General: She is not in acute distress.    Appearance: She is well-developed.  HENT:     Head: Normocephalic and atraumatic.     Mouth/Throat:     Mouth: Mucous membranes are moist.  Eyes:     Pupils: Pupils are equal, round, and  reactive to light.  Cardiovascular:  Rate and Rhythm: Normal rate and regular rhythm.     Heart sounds: No murmur heard. Pulmonary:     Effort: Pulmonary effort is normal. No respiratory distress.     Breath sounds: Normal breath sounds.  Chest:     Chest wall: Tenderness (reproduces pain) present.  Abdominal:     General: Abdomen is flat.     Palpations: Abdomen is soft.     Tenderness: There is no abdominal tenderness.  Musculoskeletal:        General: No tenderness.     Right lower leg: No edema.     Left lower leg: No edema.  Skin:    General: Skin is warm and dry.  Neurological:     General: No focal deficit present.     Mental Status: She is alert. Mental status is at baseline.  Psychiatric:        Mood and Affect: Mood normal.        Behavior: Behavior normal.     ED Results and Treatments Labs (all labs ordered are listed, but only abnormal results are displayed) Labs Reviewed  BASIC METABOLIC PANEL - Abnormal; Notable for the following components:      Result Value   Glucose, Bld 126 (*)    All other components within normal limits  CBC - Abnormal; Notable for the following components:   Platelets 92 (*)    All other components within normal limits  TROPONIN I (HIGH SENSITIVITY)                                                                                                                          Radiology DG Chest 2 View  Result Date: 05/05/2023 CLINICAL DATA:  Chest pain EXAM: CHEST - 2 VIEW COMPARISON:  Chest x-ray dated June 11, 2022 FINDINGS: The heart size and mediastinal contours are within normal limits. Both lungs are clear. The visualized skeletal structures are unremarkable. IMPRESSION: No active cardiopulmonary disease. Electronically Signed   By: Allegra Lai M.D.   On: 05/05/2023 15:57    Pertinent labs & imaging results that were available during my care of the patient were reviewed by me and considered in my medical decision making (see  MDM for details).  Medications Ordered in ED Medications - No data to display  Procedures Procedures  (including critical care time)  Medical Decision Making / ED Course   MDM:  47 year old female presenting to the emergency department with 1.5 weeks of chest pain.  Patient overall well-appearing, chest pain atypical, not associated with exertion or other dangerous symptoms.  EKG without acute change from previous or signs of acute STEMI.  Troponin negative with 1.5 weeks of symptoms.  Very low concern for cardiac cause.  Patient does have risk factors for ACS so will refer to cardiology for further workup such as stress test.  Suspect most likely musculoskeletal cause, patient does have some reproducible chest pain although she reports it is slightly different.  Doubt PE, patient PERC negative, no recent travel or surgeries, hospitalizations, pain not pleuritic and no shortness of breath.  Chest x-ray without evidence of pneumothorax, pneumonia.  No widened mediastinum to suggest dissection and with 1-1/2 weeks of symptoms extreme low concern for this.  Will discharge patient to home. All questions answered. Patient comfortable with plan of discharge. Return precautions discussed with patient and specified on the after visit summary.       Additional history obtained: -Additional history obtained from spouse -External records from outside source obtained and reviewed including: Chart review including previous notes, labs, imaging, consultation notes including prior PMD visits   Lab Tests: -I ordered, reviewed, and interpreted labs.   The pertinent results include:   Labs Reviewed  BASIC METABOLIC PANEL - Abnormal; Notable for the following components:      Result Value   Glucose, Bld 126 (*)    All other components within normal limits  CBC -  Abnormal; Notable for the following components:   Platelets 92 (*)    All other components within normal limits  TROPONIN I (HIGH SENSITIVITY)    Notable for normal troponin, nonspecific thrombocytopenia and mild hyperglycemia  EKG   EKG Interpretation Date/Time:  Monday May 05 2023 15:04:55 EDT Ventricular Rate:  71 PR Interval:  134 QRS Duration:  86 QT Interval:  538 QTC Calculation: 584 R Axis:   35  Text Interpretation: Normal sinus rhythm Low voltage QRS Nonspecific T wave abnormality When compared with ECG of 20-Nov-2021 09:10, No significant change since last tracing Confirmed by Alvino Blood ([id number]) on 05/05/2023 4:30:14 PM         Imaging Studies ordered: I ordered imaging studies including CXR On my interpretation imaging demonstrates no acute process I independently visualized and interpreted imaging. I agree with the radiologist interpretation   Medicines ordered and prescription drug management: No orders of the defined types were placed in this encounter.   -I have reviewed the patients home medicines and have made adjustments as needed   Cardiac Monitoring: The patient was maintained on a cardiac monitor.  I personally viewed and interpreted the cardiac monitored which showed an underlying rhythm of: NSR  Social Determinants of Health:  Diagnosis or treatment significantly limited by social determinants of health: obesity   Reevaluation: After the interventions noted above, I reevaluated the patient and found that their symptoms have improved  Co morbidities that complicate the patient evaluation  Past Medical History:  Diagnosis Date   Anemia Dx January 03 2015   Diabetes mellitus without complication (HCC)    Elevated liver enzymes    Essential hypertension Dx Apr 2016   Fatty liver    H/O: upper GI bleed 03/2019    variceal bleed    Headache    History of blood transfusion 03/2019   Liver cirrhosis  secondary to NASH [facility])    Neuropathy     Pneumonia 03/2019   Reflux    Thrombocytopenia (HCC)       Dispostion: Disposition decision including need for hospitalization was considered, and patient discharged from emergency department.    Final Clinical Impression(s) / ED Diagnoses Final diagnoses:  Atypical chest pain     This chart was dictated using voice recognition software.  Despite best efforts to proofread,  errors can occur which can change the documentation meaning.    Lonell Grandchild, MD 05/05/23 [phone]

## 2023-05-12 ENCOUNTER — Other Ambulatory Visit: Payer: Self-pay | Admitting: Nurse Practitioner

## 2023-05-12 DIAGNOSIS — K219 Gastro-esophageal reflux disease without esophagitis: Secondary | ICD-10-CM

## 2023-05-13 DIAGNOSIS — G43719 Chronic migraine without aura, intractable, without status migrainosus: Secondary | ICD-10-CM | POA: Diagnosis not present

## 2023-05-13 DIAGNOSIS — G518 Other disorders of facial nerve: Secondary | ICD-10-CM | POA: Diagnosis not present

## 2023-05-19 DIAGNOSIS — K7460 Unspecified cirrhosis of liver: Secondary | ICD-10-CM | POA: Diagnosis not present

## 2023-05-21 ENCOUNTER — Encounter: Payer: Self-pay | Admitting: Neurology

## 2023-05-21 ENCOUNTER — Ambulatory Visit: Payer: BC Managed Care – PPO | Admitting: Neurology

## 2023-05-21 ENCOUNTER — Encounter: Payer: BC Managed Care – PPO | Admitting: Neurology

## 2023-05-21 ENCOUNTER — Ambulatory Visit (INDEPENDENT_AMBULATORY_CARE_PROVIDER_SITE_OTHER): Payer: Self-pay | Admitting: Neurology

## 2023-05-21 DIAGNOSIS — M542 Cervicalgia: Secondary | ICD-10-CM

## 2023-05-21 DIAGNOSIS — R202 Paresthesia of skin: Secondary | ICD-10-CM

## 2023-05-21 NOTE — Procedures (Signed)
Full Name: Angela Castillo Gender: Female MRN #: [id number] Date of Birth: 1975-11-23    Visit Date: 05/21/2023 14:24 Age: 47 Years Examining Physician: Dr. Levert Feinstein Referring Physician: Dr. Levert Feinstein Height: 5 feet 5 inch History: 47 year old female presenting with intermittent bilateral upper and lower extremity paresthesia  Summary of the test:  Nerve conduction study: Bilateral sural, superficial peroneal sensory responses were normal.  Bilateral median, ulnar sensory responses were normal.  Bilateral median mixed response showed 0.4 ms prolonged latency compared to ipsilateral ulnar mixed response  Bilateral median and ulnar motor responses were normal.  Bilateral peroneal to EDB and tibial motor responses were normal  Electromyography: Selected needle examinations of left upper extremity muscles and left cervical paraspinal muscles were normal.  Conclusion: This is a slight abnormal study.  There is electrodiagnostic evidence of slight bilateral median neuropathy across the wrist, consistent with mild bilateral carpal tunnel syndromes.  There is no evidence of left cervical radiculopathy.    ------------------------------- Forrestine Him.D.Ph.D.  [facility] [address] [place], [place] [address] Tel: [phone] Fax: [phone]  Verbal informed consent was obtained from the patient, patient was informed of potential risk of procedure, including bruising, bleeding, hematoma formation, infection, muscle weakness, muscle pain, numbness, among others.        MNC    Nerve / Sites Muscle Latency Ref. Amplitude Ref. Rel Amp Segments Distance Velocity Ref. Area    ms ms mV mV %  cm m/s m/s mVms  R Median - APB     Wrist APB 3.2 ?4.4 7.6 ?4.0 100 Wrist - APB 7   24.8     Upper arm APB 7.4  8.1  107 Upper arm - Wrist 24 58 ?49 26.9  L Median - APB     Wrist APB 3.0 ?4.4 7.1 ?4.0 100 Wrist - APB 7   26.1     Upper arm APB 7.2   5.6  79.1 Upper arm - Wrist 24 57 ?49 19.6  R Ulnar - ADM     Wrist ADM 2.4 ?3.3 7.2 ?6.0 100 Wrist - ADM 7   14.1     B.Elbow ADM 4.6  6.3  87 B.Elbow - Wrist 12 56 ?49 11.3     A.Elbow ADM 7.2  5.0  80.3 A.Elbow - B.Elbow 16 60 ?49 9.9  L Ulnar - ADM     Wrist ADM 2.6 ?3.3 9.1 ?6.0 100 Wrist - ADM 7   23.9     B.Elbow ADM 5.0  8.5  92.8 B.Elbow - Wrist 13.5 55 ?49 24.0     A.Elbow ADM 7.9  8.0  93.8 A.Elbow - B.Elbow 13 45 ?49 23.5  R Peroneal - EDB     Ankle EDB 4.0 ?6.5 6.8 ?2.0 100 Ankle - EDB 9   22.5     Fib head EDB 9.5  6.1  89.5 Fib head - Ankle 25 46 ?44 21.3     Pop fossa EDB 11.2  5.9  97.1 Pop fossa - Fib head 9 52 ?44 21.4         Pop fossa - Ankle      L Peroneal - EDB     Ankle EDB 4.1 ?6.5 3.6 ?2.0 100 Ankle - EDB 9   11.1     Fib head EDB 9.6  4.3  118 Fib head - Ankle 25 45 ?44 13.5     Pop fossa EDB 11.0  5.5  128 Pop fossa - Fib head 8 56 ?44 21.9         Pop fossa - Ankle      R Tibial - AH     Ankle AH 4.4 ?5.8 11.1 ?4.0 100 Ankle - AH 9   22.2     Pop fossa AH 11.9  9.0  81 Pop fossa - Ankle 37 49 ?41 22.7  L Tibial - AH     Ankle AH 4.2 ?5.8 8.0 ?4.0 100 Ankle - AH 9   16.8     Pop fossa AH 11.6  8.0  100 Pop fossa - Ankle 37 50 ?41 22.8                     SNC    Nerve / Sites Rec. Site Peak Lat Ref.  Amp Ref. Segments Distance Peak Diff Ref.    ms ms V V  cm ms ms  R Sural - Ankle (Calf)     Calf Ankle 4.2 ?4.4 13 ?6 Calf - Ankle 14    L Sural - Ankle (Calf)     Calf Ankle 3.9 ?4.4 12 ?6 Calf - Ankle 14    R Superficial peroneal - Ankle     Lat leg Ankle 4.1 ?4.4 7 ?6 Lat leg - Ankle 14    L Superficial peroneal - Ankle     Lat leg Ankle 3.8 ?4.4 25 ?6 Lat leg - Ankle 14    R Median, Ulnar - Transcarpal comparison     Median Palm Wrist 2.2 ?2.2 99 ?35 Median Palm - Wrist 8       Ulnar Palm Wrist 1.8 ?2.2 26 ?12 Ulnar Palm - Wrist 8          Median Palm - Ulnar Palm  0.4 ?0.4  L Median, Ulnar - Transcarpal comparison     Median Palm Wrist 2.1  ?2.2 88 ?35 Median Palm - Wrist 8       Ulnar Palm Wrist 1.8 ?2.2 24 ?12 Ulnar Palm - Wrist 8          Median Palm - Ulnar Palm  0.4 ?0.4  R Median - Orthodromic (Dig II, Mid palm)     Dig II Wrist 3.2 ?3.4 27 ?10 Dig II - Wrist 13    L Median - Orthodromic (Dig II, Mid palm)     Dig II Wrist 3.1 ?3.4 33 ?10 Dig II - Wrist 13    R Ulnar - Orthodromic, (Dig V, Mid palm)     Dig V Wrist 2.8 ?3.1 15 ?5 Dig V - Wrist 11    L Ulnar - Orthodromic, (Dig V, Mid palm)     Dig V Wrist 2.9 ?3.1 9 ?5 Dig V - Wrist 10                           F  Wave    Nerve F Lat Ref.   ms ms  R Tibial - AH 50.0 ?56.0  L Tibial - AH 52.2 ?56.0  R Ulnar - ADM 25.2 ?32.0  L Ulnar - ADM 24.0 ?32.0             EMG Summary Table    Spontaneous MUAP Recruitment  Muscle IA Fib PSW Fasc Other Amp Dur. Poly Pattern  L. First dorsal interosseous Normal None None None _______ Normal Normal Normal Normal  L. Pronator teres Normal None  None None _______ Normal Normal Normal Normal  L. Biceps brachii Normal None None None _______ Normal Normal Normal Normal  L. Deltoid Normal None None None _______ Normal Normal Normal Normal  L. Triceps brachii Normal None None None _______ Normal Normal Normal Normal  L. Cervical paraspinals Normal None None None _______ Normal Normal Normal Normal

## 2023-05-21 NOTE — Progress Notes (Signed)
No chief complaint on file.     ASSESSMENT AND PLAN  Angela Castillo is a 47 y.o. female   Intermittent upper and lower extremity paresthesia, involving fourth and fifth finger most Chronic neck pain  MRI showed incidental C6 hemangioma, no evidence of significant canal foraminal stenosis,  EMG nerve conduction study showed mild bilateral carpal tunnel syndromes, as needed NSAIDs, wrist splint,   DIAGNOSTIC DATA (LABS, IMAGING, TESTING) - I reviewed patient records, labs, notes, testing and imaging myself where available.   MEDICAL HISTORY:  Angela Castillo, is a 47 year old female seen in request by her primary care nurse practitioner Angela Castillo for for evaluation of bilateral upper and lower extremity paresthesia, initial evaluation was on February 18, 2023,  I reviewed and summarized the referring note.PMHX. DM since Angela Castillo 2020 Chronic migraine. HTN HLD Liver cirrhoses due to NASH She was diagnosed with liver cirrhosis due to alcohol steatotic hepatic disease around May 2020, after GI bleeding,  Around 2023, she began to notice intermittent numbness tingling involving both feet and hands, as if circulation was cut off, denies pain  She denies neck pain, no radiating pain along her spine, no bowel or bladder incontinence  UPDATE May 21 2023: She return for electrodiagnostic study today, which only showed slight bilateral carpal tunnel syndromes.  She continues to have intermittent neck pain, paresthesia involving left arm more than right arm,  Personally reviewed MRI of cervical spine, mild degenerative changes, no significant canal or foraminal stenosis, incidental benign hemangioma within the C6 vertebral body  Lab in February 2024 showed decreased platelets 66, otherwise normal CBC, CMP  PHYSICAL EXAM:   116/74, HR 62 PHYSICAL EXAMNIATION:  Gen: NAD, conversant, well nourised, well groomed                     Cardiovascular: Regular rate  rhythm, no peripheral edema, warm, nontender. Eyes: Conjunctivae clear without exudates or hemorrhage Neck: Supple, no carotid bruits. Pulmonary: Clear to auscultation bilaterally   NEUROLOGICAL EXAM:  MENTAL STATUS: Speech/cognition: Awake, alert, oriented to history taking and casual conversation CRANIAL NERVES: CN II: Visual fields are full to confrontation. Pupils are round equal and briskly reactive to light. CN III, IV, VI: extraocular movement are normal. No ptosis. CN V: Facial sensation is intact to light touch CN VII: Face is symmetric with normal eye closure  CN VIII: Hearing is normal to causal conversation. CN IX, X: Phonation is normal. CN XI: Head turning and shoulder shrug are intact  MOTOR: There is no pronator drift of out-stretched arms. Muscle bulk and tone are normal. Muscle strength is normal.  REFLEXES: Reflexes are 3 and symmetric at the biceps, triceps, knees, and ankles. Plantar responses are flexor.  SENSORY: Intact to light touch, pinprick and vibratory sensation are intact in fingers and toes.  COORDINATION: There is no trunk or limb dysmetria noted.  GAIT/STANCE: Posture is normal. Gait is steady   REVIEW OF SYSTEMS:  Full 14 system review of systems performed and notable only for as above All other review of systems were negative.   ALLERGIES: Allergies  Allergen Reactions   Nsaids     GI BLEED    HOME MEDICATIONS: Current Outpatient Medications  Medication Sig Dispense Refill   Accu-Chek Softclix Lancets lancets USE TO CHECK BLOOD SUGAR THREE TIMES DAILY 300 each 0   acetaminophen (TYLENOL) 500 MG tablet Take 500-1,000 mg by mouth every 6 (six) hours as needed for mild pain.  amitriptyline (ELAVIL) 25 MG tablet TAKE 1 TABLET BY MOUTH AT BEDTIME 90 tablet 1   Azelastine-Fluticasone 137-50 MCG/ACT SUSP PLACE 1 SPRAY IN NOSTRIL(S) EVERY 12 HOURS 23 g 0   Biotin [address]109 MCG TABS Take 10,000 mcg by mouth daily.     blood glucose meter  kit and supplies KIT Dispense based on patient and insurance preference. Use up to four times daily as directed. (FOR ICD-9 250.00, 250.01). Dispense Relion (Patient taking differently: Dispense based on patient and insurance preference. Use up to four times daily as directed. (FOR ICD-9 250.00, 250.01). Dispense Relion as needed) 1 each 0   Blood Glucose Monitoring Suppl (ACCU-CHEK GUIDE) w/Device KIT Use to check blood sugar three times daily. 1 kit 0   ciclopirox (PENLAC) 8 % solution Apply topically at bedtime. Apply over nail and surrounding skin. Apply daily over previous coat. After seven (7) days, may remove with alcohol and continue cycle. 6.6 mL 0   dapagliflozin propanediol (FARXIGA) 5 MG TABS tablet TAKE 1 TABLET BY MOUTH EVERY DAY BEFORE BREAKFAST 90 tablet 1   esomeprazole (NEXIUM) 40 MG capsule Take 40 mg by mouth 2 (two) times daily before a meal.     ferrous sulfate 325 (65 FE) MG tablet TAKE 1 TABLET BY MOUTH TWICE A DAY WITH FOOD 180 tablet 0   glucose blood (ACCU-CHEK GUIDE) test strip USE TO CHECK BLOOD SUGAR THREE TIMES DAILY 300 strip 0   ketoconazole (NIZORAL) 2 % cream Apply 1 application  topically daily as needed for irritation (as needed).     loratadine (CLARITIN) 10 MG tablet TAKE 1 TABLET BY MOUTH EVERY DAY (Patient taking differently: Take 10 mg by mouth daily as needed (as needed).) 90 tablet 1   losartan-hydrochlorothiazide (HYZAAR) 50-12.5 MG tablet TAKE 1 TABLET BY MOUTH EVERY DAY 90 tablet 1   Menthol, Topical Analgesic, (ICY HOT MEDICATED SPRAY) 16 % LIQD Apply 1 spray topically daily as needed (pain).     metFORMIN (GLUCOPHAGE) 500 MG tablet TAKE 1 TABLET BY MOUTH 2 TIMES DAILY WITH A MEAL. 180 tablet 1   methocarbamol (ROBAXIN) 500 MG tablet TAKE 1 TABLET BY MOUTH EVERY 8 HOURS AS NEEDED FOR MUSCLE SPASM 60 tablet 0   Microlet Lancets MISC USE UP TO FOUR TIMES DAILY AS DIRECTED 100 each 11   mupirocin ointment (BACTROBAN) 2 % Apply topically 2 (two) times  daily. 22 g 0   omega-3 acid ethyl esters (LOVAZA) 1 g capsule Take 1 capsule (1 g total) by mouth 2 (two) times daily. 180 capsule 1   ondansetron (ZOFRAN-ODT) 4 MG disintegrating tablet Take 1 tablet (4 mg total) by mouth every 8 (eight) hours as needed for vomiting or nausea. 30 tablet 1   propranolol (INDERAL) 20 MG tablet TAKE 1 TABLET BY MOUTH TWICE A DAY 180 tablet 0   rosuvastatin (CRESTOR) 10 MG tablet Take 1 tablet (10 mg total) by mouth daily. 90 tablet 3   Semaglutide,0.25 or 0.5MG /DOS, (OZEMPIC, 0.25 OR 0.5 MG/DOSE,) 2 MG/3ML SOPN Inject 0.5 mg into the skin once a week. 9 mL 1   sucralfate (CARAFATE) 1 GM/10ML suspension Take 1 g by mouth 4 (four) times daily -  with meals and at bedtime.     topiramate (TOPAMAX) 50 MG tablet Take 0.5 tablets (25 mg total) by mouth 2 (two) times daily. For headaches 60 tablet 3   triamcinolone (KENALOG) 0.025 % ointment Apply 1 Application topically 2 (two) times daily. 30 g 1   vitamin C (  ASCORBIC ACID) 500 MG tablet Take 500 mg by mouth 2 (two) times daily.     No current facility-administered medications for this visit.    PAST MEDICAL HISTORY: Past Medical History:  Diagnosis Date   Anemia Dx January 03 2015   Diabetes mellitus without complication (HCC)    Elevated liver enzymes    Essential hypertension Dx Apr 2016   Fatty liver    H/O: upper GI bleed 03/2019    variceal bleed    Headache    History of blood transfusion 03/2019   Liver cirrhosis secondary to NASH (HCC)    Neuropathy    Pneumonia 03/2019   Reflux    Thrombocytopenia (HCC)     PAST SURGICAL HISTORY: Past Surgical History:  Procedure Laterality Date   DILITATION & CURRETTAGE/HYSTROSCOPY WITH HYDROTHERMAL ABLATION N/A 11/20/2021   Procedure: DILATATION & CURETTAGE/HYSTEROSCOPY WITH HYDROTHERMAL ABLATION;  Surgeon: Reva Bores, MD;  Location: [facility] OR;  Service: Gynecology;  Laterality: N/A;  rep will be here confirmed on 11/12/21 CS   ESOPHAGEAL BANDING  03/22/2019    Procedure: ESOPHAGEAL BANDING;  Surgeon: Charlott Rakes, MD;  Location: [facility];  Service: Endoscopy;;   ESOPHAGEAL BANDING  03/23/2019   Procedure: ESOPHAGEAL BANDING;  Surgeon: Charlott Rakes, MD;  Location: [facility];  Service: Endoscopy;;   ESOPHAGEAL BANDING  09/20/2019   Procedure: ESOPHAGEAL BANDING;  Surgeon: Charlott Rakes, MD;  Location: [facility];  Service: Endoscopy;;   ESOPHAGEAL BANDING N/A 12/21/2019   Procedure: ESOPHAGEAL BANDING;  Surgeon: Charlott Rakes, MD;  [facility]: [facility];  Service: Endoscopy;  Laterality: N/A;   ESOPHAGOGASTRODUODENOSCOPY N/A 03/22/2019   Procedure: ESOPHAGOGASTRODUODENOSCOPY (EGD);  Surgeon: Charlott Rakes, MD;  Location: [facility];  Service: Endoscopy;  Laterality: N/A;   ESOPHAGOGASTRODUODENOSCOPY N/A 03/23/2019   Procedure: ESOPHAGOGASTRODUODENOSCOPY (EGD);  Surgeon: Charlott Rakes, MD;  Location: [facility];  Service: Endoscopy;  Laterality: N/A;   ESOPHAGOGASTRODUODENOSCOPY (EGD) WITH PROPOFOL N/A 07/23/2019   Procedure: ESOPHAGOGASTRODUODENOSCOPY (EGD) WITH PROPOFOL with possible banding;  Surgeon: Charlott Rakes, MD;  [facility];  Service: Endoscopy;  Laterality: N/A;   ESOPHAGOGASTRODUODENOSCOPY (EGD) WITH PROPOFOL N/A 09/20/2019   Procedure: ESOPHAGOGASTRODUODENOSCOPY (EGD) WITH PROPOFOL;  Surgeon: Charlott Rakes, MD;  [facility];  Service: Endoscopy;  Laterality: N/A;   ESOPHAGOGASTRODUODENOSCOPY (EGD) WITH PROPOFOL N/A 12/21/2019   Procedure: ESOPHAGOGASTRODUODENOSCOPY (EGD) WITH PROPOFOL with possible banding;  Surgeon: Charlott Rakes, MD;  [facility]: [facility];  Service: Endoscopy;  Laterality: N/A;   HERNIA REPAIR     TUBAL LIGATION  07/25/2000   VENTRAL HERNIA REPAIR N/A 07/06/2020   Procedure: VENTRAL HERNIA REPAIR WITH MESH;  Surgeon: Abigail Miyamoto, MD;  Location: [facility] OR;  Service: General;  Laterality: N/A;    FAMILY HISTORY: Family History  Problem  Relation Age of Onset   Diabetes Father    COPD Mother    Esophageal cancer Mother    Diabetes Mother    Cancer Mother        esophageal   Hypertension Mother    Breast cancer Maternal Aunt     SOCIAL HISTORY: Social History   Socioeconomic History   Marital status: Married    Spouse name: Not on file   Number of children: 2   Years of education: Not on file   Highest education level: 12th grade  Occupational History    Comment: security guard at [employer]  Tobacco Use   Smoking status: Never   Smokeless tobacco: Never  Vaping Use   Vaping status: Never Used  Substance and Sexual Activity   Alcohol use: Not Currently    Comment: rarely-last alcohol drank in 08/2018   Drug use: No   Sexual activity: Yes  Other Topics Concern   Not on file  Social History Narrative   Not on file   Social Determinants of Health   Financial Resource Strain: Low Risk  (03/03/2023)   Overall Financial Resource Strain (CARDIA)    Difficulty of Paying Living Expenses: Not hard at all  Food Insecurity: No Food Insecurity (03/03/2023)   Hunger Vital Sign    Worried About Running Out of Food in the Last Year: Never true    Ran Out of Food in the Last Year: Never true  Transportation Needs: No Transportation Needs (03/03/2023)   PRAPARE - Administrator, Civil Service (Medical): No    Lack of Transportation (Non-Medical): No  Physical Activity: Unknown (03/03/2023)   Exercise Vital Sign    Days of Exercise per Week: 0 days    Minutes of Exercise per Session: Not on file  Stress: No Stress Concern Present (03/03/2023)   [employer] of Occupational Health - Occupational Stress Questionnaire    Feeling of Stress : Not at all  Social Connections: Moderately Isolated (03/03/2023)   Social Connection and Isolation Panel [NHANES]    Frequency of Communication with Friends and Family: More than three times a week    Frequency of Social Gatherings with Friends and Family:  Once a week    Attends Religious Services: Never    Database administrator or Organizations: No    Attends Engineer, structural: Not on file    Marital Status: Married  Catering manager Violence: Not on file      Levert Feinstein, M.D. Ph.D.  [facility] [address], [address] [place], [place] [address] Ph: [phone] Fax: [phone]  CC:  Claiborne Rigg, NP [address] [place] [address] [place],  [place] [address]  Claiborne Rigg, NP

## 2023-05-28 DIAGNOSIS — M542 Cervicalgia: Secondary | ICD-10-CM | POA: Diagnosis not present

## 2023-05-28 DIAGNOSIS — G43719 Chronic migraine without aura, intractable, without status migrainosus: Secondary | ICD-10-CM | POA: Diagnosis not present

## 2023-06-09 ENCOUNTER — Encounter: Payer: Self-pay | Admitting: Nurse Practitioner

## 2023-06-09 ENCOUNTER — Ambulatory Visit: Payer: BC Managed Care – PPO | Attending: Nurse Practitioner | Admitting: Nurse Practitioner

## 2023-06-09 VITALS — BP 103/67 | HR 74 | Ht 64.5 in | Wt 213.8 lb

## 2023-06-09 DIAGNOSIS — J301 Allergic rhinitis due to pollen: Secondary | ICD-10-CM | POA: Diagnosis not present

## 2023-06-09 DIAGNOSIS — Z0000 Encounter for general adult medical examination without abnormal findings: Secondary | ICD-10-CM | POA: Diagnosis not present

## 2023-06-09 DIAGNOSIS — D696 Thrombocytopenia, unspecified: Secondary | ICD-10-CM

## 2023-06-09 DIAGNOSIS — I10 Essential (primary) hypertension: Secondary | ICD-10-CM

## 2023-06-09 MED ORDER — FERROUS SULFATE 325 (65 FE) MG PO TABS
325.0000 mg | ORAL_TABLET | Freq: Two times a day (BID) | ORAL | 0 refills | Status: DC
Start: 2023-06-09 — End: 2023-09-09

## 2023-06-09 MED ORDER — AZELASTINE HCL 0.1 % NA SOLN
1.0000 | Freq: Two times a day (BID) | NASAL | 0 refills | Status: DC
Start: 2023-06-09 — End: 2024-01-12

## 2023-06-09 NOTE — Progress Notes (Signed)
Assessment & Plan:  Angela Castillo was seen today for annual exam.  Diagnoses and all orders for this visit:  Encounter for annual physical exam -     Hemoglobin A1c -     CMP14+EGFR  Primary hypertension -     CMP14+EGFR  Thrombocytopenia (HCC) -     ferrous sulfate 325 (65 FE) MG tablet; Take 1 tablet (325 mg total) by mouth 2 (two) times daily with a meal.  Allergic rhinitis due to pollen, unspecified seasonality -     azelastine (ASTELIN) 0.1 % nasal spray; Place 1 spray into both nostrils 2 (two) times daily. Use in each nostril as directed    Patient has been counseled on age-appropriate routine health concerns for screening and prevention. These are reviewed and up-to-date. Referrals have been placed accordingly. Immunizations are up-to-date or declined.    Subjective:   Chief Complaint  Patient presents with   Annual Exam   HPI Angela Castillo 47 y.o. female presents to office today for annual physical exam.   Blood pressure is well controlled. Weight is gradually decreasing.     Review of Systems  Constitutional:  Negative for fever, malaise/fatigue and weight loss.  HENT: Negative.  Negative for nosebleeds.   Eyes: Negative.  Negative for blurred vision, double vision and photophobia.  Respiratory: Negative.  Negative for cough and shortness of breath.   Cardiovascular: Negative.  Negative for chest pain, palpitations and leg swelling.  Gastrointestinal: Negative.  Negative for heartburn, nausea and vomiting.  Genitourinary: Negative.   Musculoskeletal: Negative.  Negative for myalgias.  Skin: Negative.   Neurological: Negative.  Negative for dizziness, focal weakness, seizures and headaches.  Endo/Heme/Allergies: Negative.   Psychiatric/Behavioral: Negative.  Negative for suicidal ideas.     Past Medical History:  Diagnosis Date   Anemia Dx January 03 2015   Diabetes mellitus without complication (HCC)    Elevated liver enzymes    Essential  hypertension Dx Apr 2016   Fatty liver    H/O: upper GI bleed 03/2019    variceal bleed    Headache    History of blood transfusion 03/2019   Liver cirrhosis secondary to NASH (HCC)    Neuropathy    Pneumonia 03/2019   Reflux    Thrombocytopenia (HCC)     Past Surgical History:  Procedure Laterality Date   DILITATION & CURRETTAGE/HYSTROSCOPY WITH HYDROTHERMAL ABLATION N/A 11/20/2021   Procedure: DILATATION & CURETTAGE/HYSTEROSCOPY WITH HYDROTHERMAL ABLATION;  Surgeon: Reva Bores, MD;  Location: [facility] OR;  Service: Gynecology;  Laterality: N/A;  rep will be here confirmed on 11/12/21 CS   ESOPHAGEAL BANDING  03/22/2019   Procedure: ESOPHAGEAL BANDING;  Surgeon: Charlott Rakes, MD;  [facility]: [facility];  Service: Endoscopy;;   ESOPHAGEAL BANDING  03/23/2019   Procedure: ESOPHAGEAL BANDING;  Surgeon: Charlott Rakes, MD;  Location: [facility];  Service: Endoscopy;;   ESOPHAGEAL BANDING  09/20/2019   Procedure: ESOPHAGEAL BANDING;  Surgeon: Charlott Rakes, MD;  [facility]: [facility];  Service: Endoscopy;;   ESOPHAGEAL BANDING N/A 12/21/2019   Procedure: ESOPHAGEAL BANDING;  Surgeon: Charlott Rakes, MD;  Location: [facility];  Service: Endoscopy;  Laterality: N/A;   ESOPHAGOGASTRODUODENOSCOPY N/A 03/22/2019   Procedure: ESOPHAGOGASTRODUODENOSCOPY (EGD);  Surgeon: Charlott Rakes, MD;  Location: [facility];  Service: Endoscopy;  Laterality: N/A;   ESOPHAGOGASTRODUODENOSCOPY N/A 03/23/2019   Procedure: ESOPHAGOGASTRODUODENOSCOPY (EGD);  Surgeon: Charlott Rakes, MD;  Location: [facility];  Service: Endoscopy;  Laterality: N/A;   ESOPHAGOGASTRODUODENOSCOPY (EGD) WITH PROPOFOL N/A 07/23/2019  Procedure: ESOPHAGOGASTRODUODENOSCOPY (EGD) WITH PROPOFOL with possible banding;  Surgeon: Charlott Rakes, MD;  [facility]: [facility];  Service: Endoscopy;  Laterality: N/A;   ESOPHAGOGASTRODUODENOSCOPY (EGD) WITH PROPOFOL N/A 09/20/2019   Procedure:  ESOPHAGOGASTRODUODENOSCOPY (EGD) WITH PROPOFOL;  Surgeon: Charlott Rakes, MD;  Location: [facility];  Service: Endoscopy;  Laterality: N/A;   ESOPHAGOGASTRODUODENOSCOPY (EGD) WITH PROPOFOL N/A 12/21/2019   Procedure: ESOPHAGOGASTRODUODENOSCOPY (EGD) WITH PROPOFOL with possible banding;  Surgeon: Charlott Rakes, MD;  Location: [facility];  Service: Endoscopy;  Laterality: N/A;   HERNIA REPAIR     TUBAL LIGATION  07/25/2000   VENTRAL HERNIA REPAIR N/A 07/06/2020   Procedure: VENTRAL HERNIA REPAIR WITH MESH;  Surgeon: Abigail Miyamoto, MD;  Location[facility] OR;  Service: General;  Laterality: N/A;    Family History  Problem Relation Age of Onset   Diabetes Father    COPD Mother    Esophageal cancer Mother    Diabetes Mother    Cancer Mother        esophageal   Hypertension Mother    Breast cancer Maternal Aunt     Social History Reviewed with no changes to be made today.   Outpatient Medications Prior to Visit  Medication Sig Dispense Refill   Accu-Chek Softclix Lancets lancets USE TO CHECK BLOOD SUGAR THREE TIMES DAILY 300 each 0   acetaminophen (TYLENOL) 500 MG tablet Take 500-1,000 mg by mouth every 6 (six) hours as needed for mild pain.     amitriptyline (ELAVIL) 25 MG tablet TAKE 1 TABLET BY MOUTH AT BEDTIME 90 tablet 1   Biotin [address]109 MCG TABS Take 10,000 mcg by mouth daily.     blood glucose meter kit and supplies KIT Dispense based on patient and insurance preference. Use up to four times daily as directed. (FOR ICD-9 250.00, 250.01). Dispense Relion (Patient taking differently: Dispense based on patient and insurance preference. Use up to four times daily as directed. (FOR ICD-9 250.00, 250.01). Dispense Relion as needed) 1 each 0   Blood Glucose Monitoring Suppl (ACCU-CHEK GUIDE) w/Device KIT Use to check blood sugar three times daily. 1 kit 0   ciclopirox (PENLAC) 8 % solution Apply topically at bedtime. Apply over nail and surrounding skin. Apply daily over previous  coat. After seven (7) days, may remove with alcohol and continue cycle. 6.6 mL 0   dapagliflozin propanediol (FARXIGA) 5 MG TABS tablet TAKE 1 TABLET BY MOUTH EVERY DAY BEFORE BREAKFAST 90 tablet 1   esomeprazole (NEXIUM) 40 MG capsule Take 40 mg by mouth 2 (two) times daily before a meal.     glucose blood (ACCU-CHEK GUIDE) test strip USE TO CHECK BLOOD SUGAR THREE TIMES DAILY 300 strip 0   ketoconazole (NIZORAL) 2 % cream Apply 1 application  topically daily as needed for irritation (as needed).     loratadine (CLARITIN) 10 MG tablet TAKE 1 TABLET BY MOUTH EVERY DAY (Patient taking differently: Take 10 mg by mouth daily as needed (as needed).) 90 tablet 1   losartan-hydrochlorothiazide (HYZAAR) 50-12.5 MG tablet TAKE 1 TABLET BY MOUTH EVERY DAY 90 tablet 1   Menthol, Topical Analgesic, (ICY HOT MEDICATED SPRAY) 16 % LIQD Apply 1 spray topically daily as needed (pain).     metFORMIN (GLUCOPHAGE) 500 MG tablet TAKE 1 TABLET BY MOUTH 2 TIMES DAILY WITH A MEAL. 180 tablet 1   methocarbamol (ROBAXIN) 500 MG tablet TAKE 1 TABLET BY MOUTH EVERY 8 HOURS AS NEEDED FOR MUSCLE SPASM 60 tablet 0   Microlet Lancets MISC USE UP  TO FOUR TIMES DAILY AS DIRECTED 100 each 11   mupirocin ointment (BACTROBAN) 2 % Apply topically 2 (two) times daily. 22 g 0   omega-3 acid ethyl esters (LOVAZA) 1 g capsule Take 1 capsule (1 g total) by mouth 2 (two) times daily. 180 capsule 1   ondansetron (ZOFRAN-ODT) 4 MG disintegrating tablet Take 1 tablet (4 mg total) by mouth every 8 (eight) hours as needed for vomiting or nausea. 30 tablet 1   propranolol (INDERAL) 20 MG tablet TAKE 1 TABLET BY MOUTH TWICE A DAY 180 tablet 0   rosuvastatin (CRESTOR) 10 MG tablet Take 1 tablet (10 mg total) by mouth daily. 90 tablet 3   Semaglutide,0.25 or 0.5MG /DOS, (OZEMPIC, 0.25 OR 0.5 MG/DOSE,) 2 MG/3ML SOPN Inject 0.5 mg into the skin once a week. 9 mL 1   sucralfate (CARAFATE) 1 GM/10ML suspension Take 1 g by mouth 4 (four) times daily -   with meals and at bedtime.     triamcinolone (KENALOG) 0.025 % ointment Apply 1 Application topically 2 (two) times daily. 30 g 1   vitamin C (ASCORBIC ACID) 500 MG tablet Take 500 mg by mouth 2 (two) times daily.     zonisamide (ZONEGRAN) 25 MG capsule Take 100 mg by mouth daily.     Azelastine-Fluticasone 137-50 MCG/ACT SUSP PLACE 1 SPRAY IN NOSTRIL(S) EVERY 12 HOURS 23 g 0   ferrous sulfate 325 (65 FE) MG tablet TAKE 1 TABLET BY MOUTH TWICE A DAY WITH FOOD 180 tablet 0   topiramate (TOPAMAX) 50 MG tablet Take 0.5 tablets (25 mg total) by mouth 2 (two) times daily. For headaches 60 tablet 3   No facility-administered medications prior to visit.    Allergies  Allergen Reactions   Nsaids     GI BLEED       Objective:    BP 103/67 (BP Location: Left Arm, Patient Position: Sitting, Cuff Size: Normal)   Pulse 74   Ht 5' 4.5" (1.638 m)   Wt 213 lb 12.8 oz (97 kg)   SpO2 100%   BMI 36.13 kg/m  Wt Readings from Last 3 Encounters:  06/09/23 213 lb 12.8 oz (97 kg)  05/21/23 230 lb (104.3 kg)  05/05/23 218 lb (98.9 kg)    Physical Exam Constitutional:      Appearance: She is well-developed.  HENT:     Head: Normocephalic and atraumatic.     Right Ear: Hearing, tympanic membrane, ear canal and external ear normal.     Left Ear: Hearing, tympanic membrane, ear canal and external ear normal.     Nose: Nose normal.     Right Turbinates: Not enlarged.     Left Turbinates: Not enlarged.     Mouth/Throat:     Lips: Pink.     Mouth: Mucous membranes are moist.     Dentition: No dental tenderness, gingival swelling, dental abscesses or gum lesions.     Pharynx: No oropharyngeal exudate.  Eyes:     General: No scleral icterus.       Right eye: No discharge.     Extraocular Movements: Extraocular movements intact.     Conjunctiva/sclera: Conjunctivae normal.     Pupils: Pupils are equal, round, and reactive to light.     Comments: Wears glasses  Neck:     Thyroid: No thyromegaly.      Trachea: No tracheal deviation.  Cardiovascular:     Rate and Rhythm: Normal rate and regular rhythm.     Heart sounds:  Normal heart sounds. No murmur heard.    No friction rub.  Pulmonary:     Effort: Pulmonary effort is normal. No accessory muscle usage or respiratory distress.     Breath sounds: Normal breath sounds. No decreased breath sounds, wheezing, rhonchi or rales.  Abdominal:     General: Bowel sounds are normal. There is no distension.     Palpations: Abdomen is soft. There is no mass.     Tenderness: There is no abdominal tenderness. There is no right CVA tenderness, left CVA tenderness, guarding or rebound.     Hernia: No hernia is present.  Musculoskeletal:        General: No tenderness or deformity. Normal range of motion.     Cervical back: Normal range of motion and neck supple.  Lymphadenopathy:     Cervical: No cervical adenopathy.  Skin:    General: Skin is warm and dry.     Findings: No erythema.  Neurological:     Mental Status: She is alert and oriented to person, place, and time.     Cranial Nerves: No cranial nerve deficit.     Motor: Motor function is intact.     Coordination: Coordination is intact. Coordination normal.     Gait: Gait is intact.     Deep Tendon Reflexes:     Reflex Scores:      Patellar reflexes are 1+ on the right side and 1+ on the left side. Psychiatric:        Attention and Perception: Attention normal.        Mood and Affect: Mood normal.        Speech: Speech normal.        Behavior: Behavior normal.        Thought Content: Thought content normal.        Judgment: Judgment normal.          Patient has been counseled extensively about nutrition and exercise as well as the importance of adherence with medications and regular follow-up. The patient was given clear instructions to go to ER or return to medical center if symptoms don't improve, worsen or new problems develop. The patient verbalized understanding.    Follow-up: Return in 3 months (on 09/12/2023).   Claiborne Rigg, FNP-BC [facility] [place], [place] [phone]   06/09/2023, 9:40 PM

## 2023-06-12 DIAGNOSIS — G43719 Chronic migraine without aura, intractable, without status migrainosus: Secondary | ICD-10-CM | POA: Diagnosis not present

## 2023-06-12 DIAGNOSIS — M542 Cervicalgia: Secondary | ICD-10-CM | POA: Diagnosis not present

## 2023-06-24 ENCOUNTER — Other Ambulatory Visit: Payer: Self-pay | Admitting: Nurse Practitioner

## 2023-06-24 DIAGNOSIS — Z1231 Encounter for screening mammogram for malignant neoplasm of breast: Secondary | ICD-10-CM

## 2023-06-25 DIAGNOSIS — M542 Cervicalgia: Secondary | ICD-10-CM | POA: Diagnosis not present

## 2023-06-25 DIAGNOSIS — G43719 Chronic migraine without aura, intractable, without status migrainosus: Secondary | ICD-10-CM | POA: Diagnosis not present

## 2023-07-01 ENCOUNTER — Inpatient Hospital Stay (HOSPITAL_BASED_OUTPATIENT_CLINIC_OR_DEPARTMENT_OTHER): Payer: BC Managed Care – PPO | Admitting: Hematology and Oncology

## 2023-07-01 ENCOUNTER — Inpatient Hospital Stay: Payer: BC Managed Care – PPO | Attending: Hematology and Oncology

## 2023-07-01 VITALS — BP 106/76 | HR 63 | Temp 97.5°F | Resp 18 | Ht 64.5 in | Wt 210.5 lb

## 2023-07-01 DIAGNOSIS — R634 Abnormal weight loss: Secondary | ICD-10-CM | POA: Insufficient documentation

## 2023-07-01 DIAGNOSIS — K7460 Unspecified cirrhosis of liver: Secondary | ICD-10-CM | POA: Diagnosis not present

## 2023-07-01 DIAGNOSIS — E611 Iron deficiency: Secondary | ICD-10-CM | POA: Insufficient documentation

## 2023-07-01 DIAGNOSIS — R161 Splenomegaly, not elsewhere classified: Secondary | ICD-10-CM | POA: Insufficient documentation

## 2023-07-01 DIAGNOSIS — Z79899 Other long term (current) drug therapy: Secondary | ICD-10-CM | POA: Insufficient documentation

## 2023-07-01 DIAGNOSIS — Z886 Allergy status to analgesic agent status: Secondary | ICD-10-CM | POA: Diagnosis not present

## 2023-07-01 DIAGNOSIS — D696 Thrombocytopenia, unspecified: Secondary | ICD-10-CM | POA: Insufficient documentation

## 2023-07-01 LAB — CBC WITH DIFFERENTIAL (CANCER CENTER ONLY)
Abs Immature Granulocytes: 0.01 10*3/uL (ref 0.00–0.07)
Basophils Absolute: 0 10*3/uL (ref 0.0–0.1)
Basophils Relative: 0 %
Eosinophils Absolute: 0.1 10*3/uL (ref 0.0–0.5)
Eosinophils Relative: 2 %
HCT: 39.6 % (ref 36.0–46.0)
Hemoglobin: 12.8 g/dL (ref 12.0–15.0)
Immature Granulocytes: 0 %
Lymphocytes Relative: 28 %
Lymphs Abs: 1.7 10*3/uL (ref 0.7–4.0)
MCH: 29.9 pg (ref 26.0–34.0)
MCHC: 32.3 g/dL (ref 30.0–36.0)
MCV: 92.5 fL (ref 80.0–100.0)
Monocytes Absolute: 0.3 10*3/uL (ref 0.1–1.0)
Monocytes Relative: 5 %
Neutro Abs: 3.8 10*3/uL (ref 1.7–7.7)
Neutrophils Relative %: 65 %
Platelet Count: 83 10*3/uL — ABNORMAL LOW (ref 150–400)
RBC: 4.28 MIL/uL (ref 3.87–5.11)
RDW: 13.9 % (ref 11.5–15.5)
WBC Count: 5.9 10*3/uL (ref 4.0–10.5)
nRBC: 0 % (ref 0.0–0.2)

## 2023-07-01 LAB — IMMATURE PLATELET FRACTION: Immature Platelet Fraction: 6.5 % (ref 1.2–8.6)

## 2023-07-01 NOTE — Assessment & Plan Note (Signed)
Originally started August 2015: Platelet count 130 01/03/2015: Hemoglobin 6.4: Required blood transfusion and was found to be iron deficient 05/01/2017: Platelet count relatively stable 147 September 2019: Platelet count 110 03/21/2019: Platelet count 94 during hospitalization for cirrhosis and gastric variceal bleeding status post banding complicated by aspiration pneumonia 03/29/2019 at the time of discharge platelet count 104 06/25/2019: Platelet count 74 06/13/2020: Platelets 107 06/27/2021: Platelets 100 12/06/2022: Platelets 66 (possibility that there may be clumping) 12/31/2022: Platelets 81, immature platelet fraction 9.4% (suggests good Bone marrow production versus ITP) 07/01/2023: Platelets 83, IPF: 6.5%,   Differential diagnosis: Cirrhosis and splenomegaly related thrombocytopenia versus ITP Previous work-up has been negative hepatitis B, hepatitis C, HIV   Continue watchful monitoring with a follow-up in 1 year with labs

## 2023-07-01 NOTE — Progress Notes (Signed)
Patient Care Team: Claiborne Rigg, NP as PCP - General (Nurse Practitioner)  DIAGNOSIS:  Encounter Diagnosis  Name Primary?   Thrombocytopenia (HCC) Yes   CHIEF COMPLIANT: Follow-up of thrombocytopenia   INTERVAL HISTORY: Angela Castillo is a 47 y.o. with above-mentioned history of thrombocytopenia. She presents to the clinic for a follow-up.patient reports that she has lost weight thanks to Ozempic.   ALLERGIES:  is allergic to nsaids.  MEDICATIONS:  Current Outpatient Medications  Medication Sig Dispense Refill   Accu-Chek Softclix Lancets lancets USE TO CHECK BLOOD SUGAR THREE TIMES DAILY 300 each 0   acetaminophen (TYLENOL) 500 MG tablet Take 500-1,000 mg by mouth every 6 (six) hours as needed for mild pain.     amitriptyline (ELAVIL) 25 MG tablet TAKE 1 TABLET BY MOUTH AT BEDTIME 90 tablet 1   azelastine (ASTELIN) 0.1 % nasal spray Place 1 spray into both nostrils 2 (two) times daily. Use in each nostril as directed 30 mL 0   Biotin 56433 MCG TABS Take 10,000 mcg by mouth daily.     blood glucose meter kit and supplies KIT Dispense based on patient and insurance preference. Use up to four times daily as directed. (FOR ICD-9 250.00, 250.01). Dispense Relion (Patient taking differently: Dispense based on patient and insurance preference. Use up to four times daily as directed. (FOR ICD-9 250.00, 250.01). Dispense Relion as needed) 1 each 0   Blood Glucose Monitoring Suppl (ACCU-CHEK GUIDE) w/Device KIT Use to check blood sugar three times daily. 1 kit 0   ciclopirox (PENLAC) 8 % solution Apply topically at bedtime. Apply over nail and surrounding skin. Apply daily over previous coat. After seven (7) days, may remove with alcohol and continue cycle. 6.6 mL 0   dapagliflozin propanediol (FARXIGA) 5 MG TABS tablet TAKE 1 TABLET BY MOUTH EVERY DAY BEFORE BREAKFAST 90 tablet 1   esomeprazole (NEXIUM) 40 MG capsule Take 40 mg by mouth 2 (two) times daily before a meal.      ferrous sulfate 325 (65 FE) MG tablet Take 1 tablet (325 mg total) by mouth 2 (two) times daily with a meal. 180 tablet 0   glucose blood (ACCU-CHEK GUIDE) test strip USE TO CHECK BLOOD SUGAR THREE TIMES DAILY 300 strip 0   ketoconazole (NIZORAL) 2 % cream Apply 1 application  topically daily as needed for irritation (as needed).     loratadine (CLARITIN) 10 MG tablet TAKE 1 TABLET BY MOUTH EVERY DAY (Patient taking differently: Take 10 mg by mouth daily as needed (as needed).) 90 tablet 1   losartan-hydrochlorothiazide (HYZAAR) 50-12.5 MG tablet TAKE 1 TABLET BY MOUTH EVERY DAY 90 tablet 1   Menthol, Topical Analgesic, (ICY HOT MEDICATED SPRAY) 16 % LIQD Apply 1 spray topically daily as needed (pain).     metFORMIN (GLUCOPHAGE) 500 MG tablet TAKE 1 TABLET BY MOUTH 2 TIMES DAILY WITH A MEAL. 180 tablet 1   methocarbamol (ROBAXIN) 500 MG tablet TAKE 1 TABLET BY MOUTH EVERY 8 HOURS AS NEEDED FOR MUSCLE SPASM 60 tablet 0   Microlet Lancets MISC USE UP TO FOUR TIMES DAILY AS DIRECTED 100 each 11   mupirocin ointment (BACTROBAN) 2 % Apply topically 2 (two) times daily. 22 g 0   omega-3 acid ethyl esters (LOVAZA) 1 g capsule Take 1 capsule (1 g total) by mouth 2 (two) times daily. 180 capsule 1   ondansetron (ZOFRAN-ODT) 4 MG disintegrating tablet Take 1 tablet (4 mg total) by mouth every 8 (eight) hours  as needed for vomiting or nausea. 30 tablet 1   propranolol (INDERAL) 20 MG tablet TAKE 1 TABLET BY MOUTH TWICE A DAY 180 tablet 0   rosuvastatin (CRESTOR) 10 MG tablet Take 1 tablet (10 mg total) by mouth daily. 90 tablet 3   Semaglutide,0.25 or 0.5MG /DOS, (OZEMPIC, 0.25 OR 0.5 MG/DOSE,) 2 MG/3ML SOPN Inject 0.5 mg into the skin once a week. 9 mL 1   sucralfate (CARAFATE) 1 GM/10ML suspension Take 1 g by mouth 4 (four) times daily -  with meals and at bedtime.     triamcinolone (KENALOG) 0.025 % ointment Apply 1 Application topically 2 (two) times daily. 30 g 1   vitamin C (ASCORBIC ACID) 500 MG tablet  Take 500 mg by mouth 2 (two) times daily.     zonisamide (ZONEGRAN) 25 MG capsule Take 100 mg by mouth daily.     No current facility-administered medications for this visit.    PHYSICAL EXAMINATION: ECOG PERFORMANCE STATUS: 1 - Symptomatic but completely ambulatory  Vitals:   07/01/23 1517  BP: 106/76  Pulse: 63  Resp: 18  Temp: (!) 97.5 F (36.4 C)  SpO2: 99%   Filed Weights   07/01/23 1517  Weight: 210 lb 8 oz (95.5 kg)      LABORATORY DATA:  I have reviewed the data as listed    Latest Ref Rng & Units 06/09/2023    3:40 PM 05/05/2023    2:58 PM 12/06/2022    4:00 PM  CMP  Glucose 70 - 99 mg/dL 95  409  90   BUN 6 - 24 mg/dL 10  12  12    Creatinine 0.57 - 1.00 mg/dL 8.11  9.14  7.82   Sodium 134 - 144 mmol/L 141  138  142   Potassium 3.5 - 5.2 mmol/L 3.5  3.7  3.8   Chloride 96 - 106 mmol/L 104  104  99   CO2 20 - 29 mmol/L 22  26  23    Calcium 8.7 - 10.2 mg/dL 9.5  9.6  9.7   Total Protein 6.0 - 8.5 g/dL 8.2   8.2   Total Bilirubin 0.0 - 1.2 mg/dL 0.7   0.8   Alkaline Phos 44 - 121 IU/L 120   108   AST 0 - 40 IU/L 24   29   ALT 0 - 32 IU/L 28   30     Lab Results  Component Value Date   WBC 5.9 07/01/2023   HGB 12.8 07/01/2023   HCT 39.6 07/01/2023   MCV 92.5 07/01/2023   PLT 83 (L) 07/01/2023   NEUTROABS 3.8 07/01/2023    ASSESSMENT & PLAN:  Thrombocytopenia (HCC) Originally started August 2015: Platelet count 130 01/03/2015: Hemoglobin 6.4: Required blood transfusion and was found to be iron deficient 05/01/2017: Platelet count relatively stable 147 September 2019: Platelet count 110 03/21/2019: Platelet count 94 during hospitalization for cirrhosis and gastric variceal bleeding status post banding complicated by aspiration pneumonia 03/29/2019 at the time of discharge platelet count 104 06/25/2019: Platelet count 74 06/13/2020: Platelets 107 06/27/2021: Platelets 100 12/06/2022: Platelets 66 (possibility that there may be clumping) 12/31/2022: Platelets 81,  immature platelet fraction 9.4% (suggests good Bone marrow production versus ITP) 07/01/2023: Platelets 83, IPF: 6.5%,   Differential diagnosis: Cirrhosis and splenomegaly related thrombocytopenia versus ITP Previous work-up has been negative hepatitis B, hepatitis C, HIV   Continue watchful monitoring with a follow-up in 1 year with labs    Orders Placed This Encounter  Procedures   CBC with Differential (Cancer Center Only)    Standing Status:   Future    Standing Expiration Date:   06/30/2024    Order Specific Question:   Release to patient    Answer:   Immediate [1]   The patient has a good understanding of the overall plan. she agrees with it. she will call with any problems that may develop before the next visit here. Total time spent: 30 mins including face to face time and time spent for planning, charting and co-ordination of care   Tamsen Meek, MD 07/01/23    I Janan Ridge am acting as a Neurosurgeon for [employer]  I have reviewed the above documentation for accuracy and completeness, and I agree with the above.

## 2023-07-04 ENCOUNTER — Other Ambulatory Visit: Payer: Self-pay | Admitting: Nurse Practitioner

## 2023-07-04 ENCOUNTER — Encounter: Payer: Self-pay | Admitting: Nurse Practitioner

## 2023-07-04 MED ORDER — VASCEPA 1 G PO CAPS: 1.0000 g | ORAL_CAPSULE | Freq: Two times a day (BID) | ORAL | 1 refills | Status: DC

## 2023-07-13 NOTE — Progress Notes (Unsigned)
Cardiology Office Note:    Date:  07/14/2023   ID:  Angela Castillo, DOB [birth year], MRN [id number]  PCP:  Claiborne Rigg, NP  Cardiologist:  None  Electrophysiologist:  None   Referring MD: Claiborne Rigg, NP   Chief Complaint  Patient presents with   Coronary Artery Disease    History of Present Illness:    Angela Castillo is a 47 y.o. female with a hx of T2DM, hypertension, cirrhosis who is referred by Bertram Denver, NP for evaluation of chest pain.  Seen in ED on 7/1 for chest pain.  Workup unremarkable.  She reports having chest pain few times per week.  She reports pain across middle of chest, describes as chest pressure, last for hours.  Occurs at rest.  Reports she does not exercise.  Reports occasional shortness of breath and some lightheadedness but denies any syncope.  No lower extremity edema.  No smoking history.  No family history of heart disease.   Past Medical History:  Diagnosis Date   Anemia Dx January 03 2015   Diabetes mellitus without complication (HCC)    Elevated liver enzymes    Essential hypertension Dx Apr 2016   Fatty liver    H/O: upper GI bleed 03/2019    variceal bleed    Headache    History of blood transfusion 03/2019   Liver cirrhosis secondary to NASH (HCC)    Neuropathy    Pneumonia 03/2019   Reflux    Thrombocytopenia (HCC)     Past Surgical History:  Procedure Laterality Date   DILITATION & CURRETTAGE/HYSTROSCOPY WITH HYDROTHERMAL ABLATION N/A 11/20/2021   Procedure: DILATATION & CURETTAGE/HYSTEROSCOPY WITH HYDROTHERMAL ABLATION;  Surgeon: Reva Bores, MD;  Location: [facility] OR;  Service: Gynecology;  Laterality: N/A;  rep will be here confirmed on 11/12/21 CS   ESOPHAGEAL BANDING  03/22/2019   Procedure: ESOPHAGEAL BANDING;  Surgeon: Charlott Rakes, MD;  Location: [facility] END[facility];  Service: Endoscopy;;   ESOPHAGEAL BANDING  03/23/2019   Procedure: ESOPHAGEAL BANDING;  Surgeon: Charlott Rakes, MD;  Location: [facility]  ENDOSCOPY;  Service: Endoscopy;;   ESOPHAGEAL BANDING  09/20/2019   Procedure: ESOPHAGEAL BANDING;  Surgeon: Charlott Rakes, MD;  Location: [facility];  Service: Endoscopy;;   ESOPHAGEAL BANDING N/A 12/21/2019   Procedure: ESOPHAGEAL BANDING;  Surgeon: Charlott Rakes, MD;  Location: [facility];  Service: Endoscopy;  Laterality: N/A;   ESOPHAGOGASTRODUODENOSCOPY N/A 03/22/2019   Procedure: ESOPHAGOGASTRODUODENOSCOPY (EGD);  Surgeon: Charlott Rakes, MD;  Location: [facility];  Service: Endoscopy;  Laterality: N/A;   ESOPHAGOGASTRODUODENOSCOPY N/A 03/23/2019   Procedure: ESOPHAGOGASTRODUODENOSCOPY (EGD);  Surgeon: Charlott Rakes, MD;  Location: [facility];  Service: Endoscopy;  Laterality: N/A;   ESOPHAGOGASTRODUODENOSCOPY (EGD) WITH PROPOFOL N/A 07/23/2019   Procedure: ESOPHAGOGASTRODUODENOSCOPY (EGD) WITH PROPOFOL with possible banding;  Surgeon: Charlott Rakes, MD;  Location: [facility];  Service: Endoscopy;  Laterality: N/A;   ESOPHAGOGASTRODUODENOSCOPY (EGD) WITH PROPOFOL N/A 09/20/2019   Procedure: ESOPHAGOGASTRODUODENOSCOPY (EGD) WITH PROPOFOL;  Surgeon: Charlott Rakes, MD;  Location: [facility];  Service: Endoscopy;  Laterality: N/A;   ESOPHAGOGASTRODUODENOSCOPY (EGD) WITH PROPOFOL N/A 12/21/2019   Procedure: ESOPHAGOGASTRODUODENOSCOPY (EGD) WITH PROPOFOL with possible banding;  Surgeon: Charlott Rakes, MD;  Location: [facility];  Service: Endoscopy;  Laterality: N/A;   HERNIA REPAIR     TUBAL LIGATION  07/25/2000   VENTRAL HERNIA REPAIR N/A 07/06/2020   Procedure: VENTRAL HERNIA REPAIR WITH MESH;  Surgeon: Abigail Miyamoto, MD;  Location: [facility] OR;  Service: General;  Laterality: N/A;  Current Medications: Current Meds  Medication Sig   Accu-Chek Softclix Lancets lancets USE TO CHECK BLOOD SUGAR THREE TIMES DAILY   acetaminophen (TYLENOL) 500 MG tablet Take 500-1,000 mg by mouth every 6 (six) hours as needed for mild pain.   azelastine (ASTELIN) 0.1  % nasal spray Place 1 spray into both nostrils 2 (two) times daily. Use in each nostril as directed   Biotin 62952 MCG TABS Take 10,000 mcg by mouth daily.   blood glucose meter kit and supplies KIT Dispense based on patient and insurance preference. Use up to four times daily as directed. (FOR ICD-9 250.00, 250.01). Dispense Relion (Patient taking differently: Dispense based on patient and insurance preference. Use up to four times daily as directed. (FOR ICD-9 250.00, 250.01). Dispense Relion as needed)   Blood Glucose Monitoring Suppl (ACCU-CHEK GUIDE) w/Device KIT Use to check blood sugar three times daily.   ciclopirox (PENLAC) 8 % solution Apply topically at bedtime. Apply over nail and surrounding skin. Apply daily over previous coat. After seven (7) days, may remove with alcohol and continue cycle.   dapagliflozin propanediol (FARXIGA) 5 MG TABS tablet TAKE 1 TABLET BY MOUTH EVERY DAY BEFORE BREAKFAST   esomeprazole (NEXIUM) 40 MG capsule Take 40 mg by mouth 2 (two) times daily before a meal.   ferrous sulfate 325 (65 FE) MG tablet Take 1 tablet (325 mg total) by mouth 2 (two) times daily with a meal.   glucose blood (ACCU-CHEK GUIDE) test strip USE TO CHECK BLOOD SUGAR THREE TIMES DAILY   icosapent Ethyl (VASCEPA) 1 g capsule Take 1 capsule (1 g total) by mouth 2 (two) times daily.   ketoconazole (NIZORAL) 2 % cream Apply 1 application  topically daily as needed for irritation (as needed).   losartan-hydrochlorothiazide (HYZAAR) 50-12.5 MG tablet TAKE 1 TABLET BY MOUTH EVERY DAY   Menthol, Topical Analgesic, (ICY HOT MEDICATED SPRAY) 16 % LIQD Apply 1 spray topically daily as needed (pain).   metFORMIN (GLUCOPHAGE) 500 MG tablet TAKE 1 TABLET BY MOUTH 2 TIMES DAILY WITH A MEAL.   Microlet Lancets MISC USE UP TO FOUR TIMES DAILY AS DIRECTED   mupirocin ointment (BACTROBAN) 2 % Apply topically 2 (two) times daily.   ondansetron (ZOFRAN-ODT) 4 MG disintegrating tablet Take 1 tablet (4 mg  total) by mouth every 8 (eight) hours as needed for vomiting or nausea.   propranolol (INDERAL) 20 MG tablet TAKE 1 TABLET BY MOUTH TWICE A DAY   rosuvastatin (CRESTOR) 10 MG tablet Take 1 tablet (10 mg total) by mouth daily.   Semaglutide,0.25 or 0.5MG /DOS, (OZEMPIC, 0.25 OR 0.5 MG/DOSE,) 2 MG/3ML SOPN Inject 0.5 mg into the skin once a week.   sucralfate (CARAFATE) 1 GM/10ML suspension Take 1 g by mouth 4 (four) times daily -  with meals and at bedtime.   triamcinolone (KENALOG) 0.025 % ointment Apply 1 Application topically 2 (two) times daily.   vitamin C (ASCORBIC ACID) 500 MG tablet Take 500 mg by mouth 2 (two) times daily.   zonisamide (ZONEGRAN) 25 MG capsule Take 100 mg by mouth daily.     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  Tobacco Use   Smoking status: Never  Smokeless tobacco: Never  Vaping Use   Vaping status: Never Used  Substance and Sexual Activity   Alcohol use: Not Currently    Comment: rarely-last alcohol drank in 08/2018   Drug use: No   Sexual activity: Yes  Other Topics Concern   Not on file  Social History Narrative   Not on file   Social Determinants of Health   Financial Resource Strain: Low Risk  (03/03/2023)   Overall Financial Resource Strain (CARDIA)    Difficulty of Paying Living Expenses: Not hard at all  Food Insecurity: No Food Insecurity (03/03/2023)   Hunger Vital Sign    Worried About Running Out of Food in the Last Year: Never true    Ran Out of Food in the Last Year: Never true  Transportation Needs: No Transportation Needs (03/03/2023)   PRAPARE - Administrator, Civil Service (Medical): No    Lack of Transportation (Non-Medical): No  Physical Activity: Unknown (03/03/2023)    Exercise Vital Sign    Days of Exercise per Week: 0 days    Minutes of Exercise per Session: Not on file  Stress: No Stress Concern Present (03/03/2023)   [employer] of Occupational Health - Occupational Stress Questionnaire    Feeling of Stress : Not at all  Social Connections: Moderately Isolated (03/03/2023)   Social Connection and Isolation Panel [NHANES]    Frequency of Communication with Friends and Family: More than three times a week    Frequency of Social Gatherings with Friends and Family: Once a week    Attends Religious Services: Never    Database administrator or Organizations: No    Attends Engineer, structural: Not on file    Marital Status: Married     Family History: The patient's ***family history includes Breast cancer in her maternal aunt; COPD in her mother; Cancer in her mother; Diabetes in her father and mother; Esophageal cancer in her mother; Hypertension in her mother.  ROS:   Please see the history of present illness.    *** All other systems reviewed and are negative.  EKGs/Labs/Other Studies Reviewed:    The following studies were reviewed today: ***  EKG:   07/14/2023: Normal sinus rhythm, poor R wave progression, rate 65  Recent Labs: 06/09/2023: ALT 28; BUN 10; Creatinine, Ser 0.69; Potassium 3.5; Sodium 141 07/01/2023: Hemoglobin 12.8; Platelet Count 83  Recent Lipid Panel    Component Value Date/Time   CHOL 125 07/26/2022 1156   TRIG 95 07/26/2022 1156   HDL 34 (L) 07/26/2022 1156   CHOLHDL 3.7 07/26/2022 1156   CHOLHDL 8.0 (H) 06/12/2015 1756   VLDL 28 06/12/2015 1756   LDLCALC 73 07/26/2022 1156    Physical Exam:    VS:  BP 120/80   Pulse 63   Ht 5\' 4"  (1.626 m)   Wt 209 lb (94.8 kg)   SpO2 97%   BMI 35.87 kg/m     Wt Readings from Last 3 Encounters:  07/14/23 209 lb (94.8 kg)  07/01/23 210 lb 8 oz (95.5 kg)  06/09/23 213 lb 12.8 oz (97 kg)     GEN: *** Well nourished, well developed in no acute  distress HEENT: Normal NECK: No JVD; No carotid bruits LYMPHATICS: No lymphadenopathy CARDIAC: ***RRR, no murmurs, rubs, gallops RESPIRATORY:  Clear to auscultation without rales, wheezing or rhonchi  ABDOMEN: Soft, non-tender, non-distended MUSCULOSKELETAL:  No edema; No deformity  SKIN: Warm and dry NEUROLOGIC:  Alert and oriented x  3 PSYCHIATRIC:  Normal affect   ASSESSMENT:    1. Essential hypertension   2. Chest pain of uncertain etiology    PLAN:    Chest pain: Atypical description but does have multiple CAD risk factors (hypertension, hyperlipidemia, T2DM).  Recommend coronary CTA to rule out obstructive CAD.  Heart rate in low 60s in clinic today, would continue home propranolol for study.  Check echocardiogram to rule out structural heart disease.  Hypertension: On losartan-HCTZ 50-12.5 mg daily, propranolol 20 mg twice daily.  Appears controlled  Hyperlipidemia: On rosuvastatin 10 mg daily.  LDL 73 on 07/2022.  Check lipid panel  T2DM: On Farxiga and metformin.  Last A1c 5.8% on 06/09/23  Cirrhosis: follows with [facility]  RTC in 4 months  Medication Adjustments/Labs and Tests Ordered: Current medicines are reviewed at length with the patient today.  Concerns regarding medicines are outlined above.  Orders Placed This Encounter  Procedures   EKG 12-Lead   No orders of the defined types were placed in this encounter.   There are no Patient Instructions on file for this visit.   Signed, Little Ishikawa, MD  07/14/2023 4:25 PM    [facility]

## 2023-07-14 ENCOUNTER — Encounter: Payer: Self-pay | Admitting: Cardiology

## 2023-07-14 ENCOUNTER — Ambulatory Visit: Payer: BC Managed Care – PPO | Attending: Cardiology | Admitting: Cardiology

## 2023-07-14 VITALS — BP 120/80 | HR 63 | Ht 64.0 in | Wt 209.0 lb

## 2023-07-14 DIAGNOSIS — R079 Chest pain, unspecified: Secondary | ICD-10-CM | POA: Diagnosis not present

## 2023-07-14 DIAGNOSIS — I10 Essential (primary) hypertension: Secondary | ICD-10-CM | POA: Diagnosis not present

## 2023-07-14 DIAGNOSIS — E785 Hyperlipidemia, unspecified: Secondary | ICD-10-CM

## 2023-07-14 NOTE — Patient Instructions (Signed)
Medication Instructions:  No changes *If you need a refill on your cardiac medications before your next appointment, please call your pharmacy*   Lab Work: BMET, Lipid Panel.  If you have labs (blood work) drawn today and your tests are completely normal, you will receive your results only by: MyChart Message (if you have MyChart) OR A paper copy in the mail If you have any lab test that is abnormal or we need to change your treatment, we will call you to review the results.   Testing/Procedures: [address]. Your physician has requested that you have an echocardiogram. Echocardiography is a painless test that uses sound waves to create images of your heart. It provides your doctor with information about the size and shape of your heart and how well your heart's chambers and valves are working. This procedure takes approximately one hour. There are no restrictions for this procedure. Please do NOT wear cologne, perfume, aftershave, or lotions (deodorant is allowed). Please arrive 15 minutes prior to your appointment time.      Your cardiac CT will be scheduled at one of the below locations:   [facility] [address] [place], [place] [address] [phone]  If scheduled at [facility], please arrive at the [facility] and Children's Entrance (Entrance C2) of [facility] 30 minutes prior to test start time. You can use the FREE valet parking offered at entrance C (encouraged to control the heart rate for the test)  Proceed to the [facility] Radiology Department (first floor) to check-in and test prep.  All radiology patients and guests should use entrance C2 at [facility], accessed from [facility] even though the hospital's physical address listed is [address]    If scheduled at [facility] or [facility], please arrive 15 mins early for check-in  and test prep.  There is spacious parking and easy access to the radiology department from the [facility] entrance. Please enter here and check-in with the desk attendant.     On the Night Before the Test: Be sure to Drink plenty of water. Do not consume any caffeinated/decaffeinated beverages or chocolate 12 hours prior to your test. Do not take any antihistamines 12 hours prior to your test. On the Day of the Test: Drink plenty of water until 1 hour prior to the test. Do not eat any food 1 hour prior to test. You may take your regular medications prior to the test.  Take metoprolol (Lopressor) two hours prior to test. If you take Furosemide/Hydrochlorothiazide/Spironolactone, please HOLD on the morning of the test. FEMALES- please wear underwire-free bra if available, avoid dresses & tight clothing  *After the Test: Drink plenty of water. After receiving IV contrast, you may experience a mild flushed feeling. This is normal. On occasion, you may experience a mild rash up to 24 hours after the test. This is not dangerous. If this occurs, you can take Benadryl 25 mg and increase your fluid intake. If you experience trouble breathing, this can be serious. If it is severe call 911 IMMEDIATELY. If it is mild, please call our office. If you take any of these medications: Glipizide/Metformin, Avandament, Glucavance, please do not take 48 hours after completing test unless otherwise instructed.  We will call to schedule your test 2-4 weeks out understanding that some insurance companies will need an authorization prior to the service being performed.   For  more information and frequently asked questions, please visit our website : [email or web address]  For non-scheduling related questions, please contact the cardiac imaging nurse navigator should you have any questions/concerns: Cardiac Imaging Nurse Navigators Direct Office Dial: [phone]   For scheduling  needs, including cancellations and rescheduling, please call [place], [phone].   Follow-Up: At [facility], you and your health needs are our priority.  As part of our continuing mission to provide you with exceptional heart care, we have created designated Provider Care Teams.  These Care Teams include your primary Cardiologist (physician) and Advanced Practice Providers (APPs -  Physician Assistants and Nurse Practitioners) who all work together to provide you with the care you need, when you need it.  Your next appointment:   4 month(s)  Provider:   Epifanio Lesches, MD

## 2023-07-15 DIAGNOSIS — R079 Chest pain, unspecified: Secondary | ICD-10-CM | POA: Diagnosis not present

## 2023-07-15 DIAGNOSIS — I10 Essential (primary) hypertension: Secondary | ICD-10-CM | POA: Diagnosis not present

## 2023-07-16 ENCOUNTER — Other Ambulatory Visit: Payer: Self-pay

## 2023-07-16 DIAGNOSIS — E876 Hypokalemia: Secondary | ICD-10-CM

## 2023-07-16 LAB — LIPID PANEL
Chol/HDL Ratio: 3.6 ratio (ref 0.0–4.4)
Cholesterol, Total: 120 mg/dL (ref 100–199)
HDL: 33 mg/dL — ABNORMAL LOW (ref >39)
LDL Chol Calc (NIH): 65 mg/dL (ref 0–99)
Triglycerides: 121 mg/dL (ref 0–149)
VLDL Cholesterol Cal: 22 mg/dL (ref 5–40)

## 2023-07-16 LAB — BASIC METABOLIC PANEL
BUN/Creatinine Ratio: 21 (ref 9–23)
BUN: 13 mg/dL (ref 6–24)
CO2: 24 mmol/L (ref 20–29)
Calcium: 9.6 mg/dL (ref 8.7–10.2)
Chloride: 104 mmol/L (ref 96–106)
Creatinine, Ser: 0.61 mg/dL (ref 0.57–1.00)
Glucose: 87 mg/dL (ref 70–99)
Potassium: 3.3 mmol/L — ABNORMAL LOW (ref 3.5–5.2)
Sodium: 143 mmol/L (ref 134–144)
eGFR: 112 mL/min/{1.73_m2} (ref >59)

## 2023-07-16 MED ORDER — POTASSIUM CHLORIDE CRYS ER 20 MEQ PO TBCR: 20.0000 meq | EXTENDED_RELEASE_TABLET | Freq: Every day | ORAL | 3 refills | Status: DC

## 2023-07-19 ENCOUNTER — Encounter: Payer: Self-pay | Admitting: Cardiology

## 2023-07-23 ENCOUNTER — Other Ambulatory Visit: Payer: Self-pay | Admitting: Nurse Practitioner

## 2023-07-23 DIAGNOSIS — E119 Type 2 diabetes mellitus without complications: Secondary | ICD-10-CM

## 2023-07-24 ENCOUNTER — Encounter (HOSPITAL_COMMUNITY): Payer: Self-pay

## 2023-07-24 DIAGNOSIS — E876 Hypokalemia: Secondary | ICD-10-CM | POA: Diagnosis not present

## 2023-07-25 ENCOUNTER — Other Ambulatory Visit: Payer: Self-pay

## 2023-07-25 DIAGNOSIS — I10 Essential (primary) hypertension: Secondary | ICD-10-CM

## 2023-07-25 DIAGNOSIS — E876 Hypokalemia: Secondary | ICD-10-CM

## 2023-07-25 LAB — BASIC METABOLIC PANEL
BUN/Creatinine Ratio: 15 (ref 9–23)
BUN: 11 mg/dL (ref 6–24)
CO2: 26 mmol/L (ref 20–29)
Calcium: 9.5 mg/dL (ref 8.7–10.2)
Chloride: 102 mmol/L (ref 96–106)
Creatinine, Ser: 0.73 mg/dL (ref 0.57–1.00)
Glucose: 110 mg/dL — ABNORMAL HIGH (ref 70–99)
Potassium: 3.4 mmol/L — ABNORMAL LOW (ref 3.5–5.2)
Sodium: 144 mmol/L (ref 134–144)
eGFR: 103 mL/min/{1.73_m2} (ref >59)

## 2023-07-25 MED ORDER — LOSARTAN POTASSIUM 50 MG PO TABS: 50.0000 mg | ORAL_TABLET | Freq: Every day | ORAL | 3 refills | Status: DC

## 2023-07-28 ENCOUNTER — Ambulatory Visit (HOSPITAL_COMMUNITY)
Admission: RE | Admit: 2023-07-28 | Discharge: 2023-07-28 | Disposition: A | Discharge status: Home / Self Care | Payer: BC Managed Care – PPO | Source: Ambulatory Visit | Attending: Cardiology | Admitting: Cardiology

## 2023-07-28 DIAGNOSIS — I10 Essential (primary) hypertension: Secondary | ICD-10-CM | POA: Diagnosis not present

## 2023-07-28 DIAGNOSIS — R072 Precordial pain: Secondary | ICD-10-CM | POA: Diagnosis not present

## 2023-07-28 DIAGNOSIS — R079 Chest pain, unspecified: Secondary | ICD-10-CM | POA: Insufficient documentation

## 2023-07-28 MED ORDER — IOHEXOL 350 MG/ML SOLN
95.0000 mL | Freq: Once | INTRAVENOUS | Status: AC | PRN
  Administered 2023-07-28: 95 mL via INTRAVENOUS

## 2023-07-28 MED ORDER — NITROGLYCERIN 0.4 MG SL SUBL
SUBLINGUAL_TABLET | SUBLINGUAL | Status: AC
  Filled 2023-07-28: qty 2

## 2023-07-28 MED ORDER — NITROGLYCERIN 0.4 MG SL SUBL
0.8000 mg | SUBLINGUAL_TABLET | Freq: Once | SUBLINGUAL | Status: AC
  Administered 2023-07-28: 0.8 mg via SUBLINGUAL

## 2023-07-30 ENCOUNTER — Ambulatory Visit (HOSPITAL_COMMUNITY): Payer: BC Managed Care – PPO | Attending: Cardiology

## 2023-07-30 DIAGNOSIS — I10 Essential (primary) hypertension: Secondary | ICD-10-CM | POA: Diagnosis not present

## 2023-07-30 DIAGNOSIS — R079 Chest pain, unspecified: Secondary | ICD-10-CM

## 2023-07-30 LAB — ECHOCARDIOGRAM COMPLETE
Area-P 1/2: 3.12 cm2
S' Lateral: 3.2 cm

## 2023-07-31 ENCOUNTER — Encounter: Payer: Self-pay | Admitting: Cardiology

## 2023-08-01 ENCOUNTER — Encounter: Payer: Self-pay | Admitting: Nurse Practitioner

## 2023-08-01 ENCOUNTER — Encounter: Payer: Self-pay | Admitting: Family Medicine

## 2023-08-03 ENCOUNTER — Encounter: Payer: Self-pay | Admitting: Cardiology

## 2023-08-04 ENCOUNTER — Encounter: Payer: Self-pay | Admitting: Family Medicine

## 2023-08-04 ENCOUNTER — Ambulatory Visit: Payer: Self-pay

## 2023-08-04 DIAGNOSIS — G43719 Chronic migraine without aura, intractable, without status migrainosus: Secondary | ICD-10-CM | POA: Diagnosis not present

## 2023-08-04 DIAGNOSIS — M542 Cervicalgia: Secondary | ICD-10-CM | POA: Diagnosis not present

## 2023-08-04 NOTE — Telephone Encounter (Signed)
Noted  

## 2023-08-04 NOTE — Telephone Encounter (Signed)
Chief Complaint: Vaginal Spotting Symptoms: abdominal cramps  Frequency: comes and goes  Pertinent Negatives: Patient denies menstrual periods, pregnancy Disposition: [] ED /[] Urgent Care (no appt availability in office) / [] Appointment(In office/virtual)/ []  [facility]/ [] Home Care/ [] Refused Recommended Disposition /[]  [facility]/ [x]  Follow-up with PCP Additional Notes: Patient stated she notice vaginal spotting on Friday after wiping  and mild bleeding shortly after the spotting. Patient stated the bleeding continued until Saturday evening. Patient states she has not had a menstrual period in over a year. Patient stated she had a cardiac CT on Monday and that is the only thing she can thing of that was out of her normal routine. Care advice was given but no appointments are available in the office. Patient stated she would call her Gynecologist office for an appointment. Advised patient to be seen in the next 2 weeks. Patient verbalized understanding.   Reason for Disposition  [1] Bleeding or spotting between regular periods AND [2] occurs more than three cycles (3 months) this past year  Answer Assessment - Initial Assessment Questions 1. AMOUNT: "Describe the bleeding that you are having."    - SPOTTING: spotting, or pinkish / brownish mucous discharge; does not fill panty liner or pad    - MILD:  less than 1 pad / hour; less than patient's usual menstrual bleeding   - MODERATE: 1-2 pads / hour; 1 menstrual cup every 6 hours; small-medium blood clots (e.g., pea, grape, small coin)   - SEVERE: soaking 2 or more pads/hour for 2 or more hours; 1 menstrual cup every 2 hours; bleeding not contained by pads or continuous red blood from vagina; large blood clots (e.g., golf ball, large coin)      Spotting 2. ONSET: "When did the bleeding begin?" "Is it continuing now?"     Friday and it stop about Saturday evening 3. MENSTRUAL PERIOD: "When was the last normal menstrual  period?" "How is this different than your period?"     I do not have menstrual periods any more 4. REGULARITY: "How regular are your periods?"     I don't have menstrual periods  5. ABDOMEN PAIN: "Do you have any pain?" "How bad is the pain?"  (e.g., Scale 1-10; mild, moderate, or severe)   - MILD (1-3): doesn't interfere with normal activities, abdomen soft and not tender to touch    - MODERATE (4-7): interferes with normal activities or awakens from sleep, abdomen tender to touch    - SEVERE (8-10): excruciating pain, doubled over, unable to do any normal activities      Mild pressure  6. PREGNANCY: "Is there any chance you are pregnant?" "When was your last menstrual period?"     No, I'm not pregnant 7. BREASTFEEDING: "Are you breastfeeding?"     No 8. HORMONE MEDICINES: "Are you taking any hormone medicines, prescription or over-the-counter?" (e.g., birth control pills, estrogen)     No 9. BLOOD THINNER MEDICINES: "Do you take any blood thinners?" (e.g., Coumadin / warfarin, Pradaxa / dabigatran, aspirin)     No 10. CAUSE: "What do you think is causing the bleeding?" (e.g., recent gyn surgery, recent gyn procedure; known bleeding disorder, cervical cancer, polycystic ovarian disease, fibroids)         No, but I had a cardiac CT scan on Monday  11. HEMODYNAMIC STATUS: "Are you weak or feeling lightheaded?" If Yes, ask: "Can you stand and walk normally?"        No  12. OTHER SYMPTOMS: "  What other symptoms are you having with the bleeding?" (e.g., passed tissue, vaginal discharge, fever, menstrual-type cramps)       Mild Cramps  Protocols used: Vaginal Bleeding - Abnormal-A-AH

## 2023-08-08 ENCOUNTER — Ambulatory Visit: Payer: BC Managed Care – PPO

## 2023-08-11 ENCOUNTER — Ambulatory Visit
Admission: RE | Admit: 2023-08-11 | Discharge: 2023-08-11 | Disposition: A | Discharge status: Home / Self Care | Payer: BC Managed Care – PPO | Source: Ambulatory Visit | Attending: Nurse Practitioner | Admitting: Nurse Practitioner

## 2023-08-11 DIAGNOSIS — Z1231 Encounter for screening mammogram for malignant neoplasm of breast: Secondary | ICD-10-CM

## 2023-08-12 ENCOUNTER — Ambulatory Visit (INDEPENDENT_AMBULATORY_CARE_PROVIDER_SITE_OTHER): Payer: BC Managed Care – PPO

## 2023-08-12 ENCOUNTER — Other Ambulatory Visit: Payer: Self-pay

## 2023-08-12 VITALS — BP 119/66 | HR 55 | Ht 64.5 in | Wt 208.0 lb

## 2023-08-12 DIAGNOSIS — R350 Frequency of micturition: Secondary | ICD-10-CM

## 2023-08-12 LAB — GLUCOSE, CAPILLARY: Glucose-Capillary: 96 mg/dL (ref 70–99)

## 2023-08-12 NOTE — Addendum Note (Signed)
Addended by: Cinda Quest A on: 08/12/2023 02:58 PM   Modules accepted: Orders

## 2023-08-12 NOTE — Progress Notes (Signed)
Angela Castillo presents to office today reporting dysuria, urinary frequency, and incomplete emptying and pelvic pressure and lower abdominal cramping with scant spotting (which has subsided for a week now per patient for past 14 days. UA reveals significant amount of glucose in urine as only abnormality. Hx of current T2DM per chart and patient, currently being managed by PCP--patient reports she hasn't taken blood glucose "in a while". Will send urine for culture. Patient declined antibiotic since the only abnormality was glucose in the urine. Discussed urine culture results take 3-4 days to come back & she will be contacted if any changes are necessary. Patient verbalized understanding to all & had no questions at this time.  Last time patient ate anything today 11:30AM. Patient requesting CBG done in office. CBG in office was 96. Patient does report weight loss changes especially since starting Wegovy, as well as major dietary changes.   Encouraged patient to follow up with PCP in regards to this, also explaining how polyuria is a common symptom associated with DM. Patient verbalized understanding and had no further questions.   Meryl Crutch, RN 08/12/2023 [phone]

## 2023-08-13 DIAGNOSIS — K219 Gastro-esophageal reflux disease without esophagitis: Secondary | ICD-10-CM | POA: Diagnosis not present

## 2023-08-13 DIAGNOSIS — K7460 Unspecified cirrhosis of liver: Secondary | ICD-10-CM | POA: Diagnosis not present

## 2023-08-13 LAB — POCT URINALYSIS DIP (DEVICE)
Bilirubin Urine: NEGATIVE
Glucose, UA: 500 mg/dL — AB
Hgb urine dipstick: NEGATIVE
Ketones, ur: NEGATIVE mg/dL
Leukocytes,Ua: NEGATIVE
Nitrite: NEGATIVE
Protein, ur: NEGATIVE mg/dL
Specific Gravity, Urine: >=1.03 (ref 1.005–1.030)
Urobilinogen, UA: 1 mg/dL (ref 0.0–1.0)
pH: 6 (ref 5.0–8.0)

## 2023-08-14 LAB — URINE CULTURE

## 2023-08-22 ENCOUNTER — Other Ambulatory Visit: Payer: Self-pay | Admitting: Nurse Practitioner

## 2023-09-02 DIAGNOSIS — M542 Cervicalgia: Secondary | ICD-10-CM | POA: Diagnosis not present

## 2023-09-02 DIAGNOSIS — G43719 Chronic migraine without aura, intractable, without status migrainosus: Secondary | ICD-10-CM | POA: Diagnosis not present

## 2023-09-03 ENCOUNTER — Encounter: Payer: Self-pay | Admitting: Nurse Practitioner

## 2023-09-07 ENCOUNTER — Other Ambulatory Visit: Payer: Self-pay | Admitting: Nurse Practitioner

## 2023-09-07 DIAGNOSIS — R0689 Other abnormalities of breathing: Secondary | ICD-10-CM

## 2023-09-07 MED ORDER — ALBUTEROL SULFATE HFA 108 (90 BASE) MCG/ACT IN AERS
2.0000 | INHALATION_SPRAY | Freq: Four times a day (QID) | RESPIRATORY_TRACT | 1 refills | Status: AC | PRN
Start: 2023-09-07 — End: ?
  Filled 2023-09-07: qty 6.7, 25d supply, fill #0

## 2023-09-08 ENCOUNTER — Other Ambulatory Visit: Payer: Self-pay

## 2023-09-09 ENCOUNTER — Ambulatory Visit: Payer: BC Managed Care – PPO | Attending: Nurse Practitioner | Admitting: Nurse Practitioner

## 2023-09-09 ENCOUNTER — Encounter: Payer: Self-pay | Admitting: Nurse Practitioner

## 2023-09-09 ENCOUNTER — Other Ambulatory Visit: Payer: Self-pay

## 2023-09-09 ENCOUNTER — Encounter: Payer: Self-pay | Admitting: Cardiology

## 2023-09-09 VITALS — BP 131/84 | HR 60 | Ht 64.5 in | Wt 197.8 lb

## 2023-09-09 DIAGNOSIS — E118 Type 2 diabetes mellitus with unspecified complications: Secondary | ICD-10-CM

## 2023-09-09 DIAGNOSIS — R202 Paresthesia of skin: Secondary | ICD-10-CM

## 2023-09-09 DIAGNOSIS — D696 Thrombocytopenia, unspecified: Secondary | ICD-10-CM

## 2023-09-09 DIAGNOSIS — I10 Essential (primary) hypertension: Secondary | ICD-10-CM | POA: Diagnosis not present

## 2023-09-09 DIAGNOSIS — E876 Hypokalemia: Secondary | ICD-10-CM | POA: Diagnosis not present

## 2023-09-09 DIAGNOSIS — R232 Flushing: Secondary | ICD-10-CM | POA: Diagnosis not present

## 2023-09-09 DIAGNOSIS — K219 Gastro-esophageal reflux disease without esophagitis: Secondary | ICD-10-CM

## 2023-09-09 DIAGNOSIS — Z7984 Long term (current) use of oral hypoglycemic drugs: Secondary | ICD-10-CM

## 2023-09-09 DIAGNOSIS — Z7985 Long-term (current) use of injectable non-insulin antidiabetic drugs: Secondary | ICD-10-CM

## 2023-09-09 MED ORDER — FERROUS SULFATE 325 (65 FE) MG PO TABS: 325.0000 mg | ORAL_TABLET | Freq: Two times a day (BID) | ORAL | 2 refills | Status: DC

## 2023-09-09 MED ORDER — METFORMIN HCL 500 MG PO TABS
500.0000 mg | ORAL_TABLET | Freq: Two times a day (BID) | ORAL | 1 refills | Status: DC
Start: 2023-09-09 — End: 2024-05-16

## 2023-09-09 MED ORDER — GABAPENTIN 300 MG PO CAPS: 300.0000 mg | ORAL_CAPSULE | Freq: Three times a day (TID) | ORAL | 3 refills | Status: DC

## 2023-09-09 MED ORDER — ONDANSETRON 4 MG PO TBDP
4.0000 mg | ORAL_TABLET | Freq: Three times a day (TID) | ORAL | 1 refills | Status: DC | PRN
Start: 2023-09-09 — End: 2023-10-06

## 2023-09-09 MED ORDER — GABAPENTIN 300 MG PO CAPS: 300.0000 mg | ORAL_CAPSULE | Freq: Every day | ORAL | 3 refills | Status: DC

## 2023-09-09 NOTE — Progress Notes (Addendum)
Assessment & Plan:  Angela Castillo was seen today for medical management of chronic issues.  Diagnoses and all orders for this visit:  Primary hypertension -     CMP14+EGFR Continue all antihypertensives as prescribed.  Reminded to bring in blood pressure log for follow  up appointment.  RECOMMENDATIONS: DASH/Mediterranean Diets are healthier choices for HTN.    Hypokalemia -     CMP14+EGFR  Type 2 diabetes mellitus with unspecified complications (HCC) -     metFORMIN (GLUCOPHAGE) 500 MG tablet; Take 1 tablet (500 mg total) by mouth 2 (two) times daily with a meal. Continue blood sugar control as discussed in office today, low carbohydrate diet, and regular physical exercise as tolerated, 150 minutes per week (30 min each day, 5 days per week, or 50 min 3 days per week). Keep blood sugar logs with fasting goal of 90-130 mg/dl, post prandial (after you eat) less than 180.  For Hypoglycemia: BS <60 and Hyperglycemia BS >400; contact the clinic ASAP. Annual eye exams and foot exams are recommended.   Thrombocytopenia (HCC) -     ferrous sulfate 325 (65 FE) MG tablet; Take 1 tablet (325 mg total) by mouth 2 (two) times daily with a meal.  Gastroesophageal reflux disease without esophagitis -     ondansetron (ZOFRAN-ODT) 4 MG disintegrating tablet; Take 1 tablet (4 mg total) by mouth every 8 (eight) hours as needed for vomiting or nausea. INSTRUCTIONS: Avoid GERD Triggers: acidic, spicy or fried foods, caffeine, coffee, sodas,  alcohol and chocolate.    Paresthesia -     Magnesium -     gabapentin (NEURONTIN) 300 MG capsule; Take 1 capsule (300 mg total) by mouth at bedtime. For hot flashes  Sacral back pain -     gabapentin (NEURONTIN) 300 MG capsule; Take 1 capsule (300 mg total) by mouth at bedtime. For hot flashes     Patient has been counseled on age-appropriate routine health concerns for screening and prevention. These are reviewed and up-to-date. Referrals have been placed  accordingly. Immunizations are up-to-date or declined.    Subjective:   Chief Complaint  Patient presents with   Medical Management of Chronic Issues    Angela Castillo 47 y.o. female presents to office today for follow up to HTN  She has a past medical history of Anemia (Dx January 03 2015), DM2, Elevated liver enzymes, Essential hypertension (Dx Apr 2016), Fatty liver, H/O: upper GI bleed (03/2019), Headache, History of blood transfusion (03/2019), Liver cirrhosis secondary to NASH, Neuropathy, Pneumonia (03/2019), Reflux, and Thrombocytopenia (HCC).     Blood pressure is well controlled.  BP Readings from Last 3 Encounters:  09/09/23 131/84  08/12/23 119/66  07/28/23 106/64    She is having hot flashes. Seem to be worsening recently. Flashes are intense. Associated symptoms include irritability.   DM 2 A1c at goal of <6.5 Lab Results  Component Value Date   HGBA1C 5.8 (H) 06/09/2023    Review of Systems  Constitutional:  Negative for fever, malaise/fatigue and weight loss.       Hot flashes  HENT: Negative.  Negative for nosebleeds.   Eyes: Negative.  Negative for blurred vision, double vision and photophobia.  Respiratory: Negative.  Negative for cough and shortness of breath.   Cardiovascular: Negative.  Negative for chest pain, palpitations, leg swelling and PND.  Gastrointestinal:  Positive for heartburn. Negative for nausea and vomiting.  Musculoskeletal:  Positive for back pain. Negative for myalgias.  Neurological: Negative.  Negative for  dizziness, focal weakness, seizures and headaches.  Psychiatric/Behavioral: Negative.  Negative for suicidal ideas.     Past Medical History:  Diagnosis Date   Anemia Dx January 03 2015   Diabetes mellitus without complication (HCC)    Elevated liver enzymes    Essential hypertension Dx Apr 2016   Fatty liver    H/O: upper GI bleed 03/2019    variceal bleed    Headache    History of blood transfusion 03/2019   Liver  cirrhosis secondary to NASH (HCC)    Neuropathy    Pneumonia 03/2019   Reflux    Thrombocytopenia (HCC)     Past Surgical History:  Procedure Laterality Date   DILITATION & CURRETTAGE/HYSTROSCOPY WITH HYDROTHERMAL ABLATION N/A 11/20/2021   Procedure: DILATATION & CURETTAGE/HYSTEROSCOPY WITH HYDROTHERMAL ABLATION;  Surgeon: Reva Bores, MD;  Location: [facility] OR;  Service: Gynecology;  Laterality: N/A;  rep will be here confirmed on 11/12/21 CS   ESOPHAGEAL BANDING  03/22/2019   Procedure: ESOPHAGEAL BANDING;  Surgeon: Charlott Rakes, MD;  Location: [facility];  Service: Endoscopy;;   ESOPHAGEAL BANDING  03/23/2019   Procedure: ESOPHAGEAL BANDING;  Surgeon: Charlott Rakes, MD;  Location: [facility];  Service: Endoscopy;;   ESOPHAGEAL BANDING  09/20/2019   Procedure: ESOPHAGEAL BANDING;  Surgeon: Charlott Rakes, MD;  Location: [facility];  Service: Endoscopy;;   ESOPHAGEAL BANDING N/A 12/21/2019   Procedure: ESOPHAGEAL BANDING;  Surgeon: Charlott Rakes, MD;  Location: [facility];  Service: Endoscopy;  Laterality: N/A;   ESOPHAGOGASTRODUODENOSCOPY N/A 03/22/2019   Procedure: ESOPHAGOGASTRODUODENOSCOPY (EGD);  Surgeon: Charlott Rakes, MD;  Location: [facility];  Service: Endoscopy;  Laterality: N/A;   ESOPHAGOGASTRODUODENOSCOPY N/A 03/23/2019   Procedure: ESOPHAGOGASTRODUODENOSCOPY (EGD);  Surgeon: Charlott Rakes, MD;  Location: [facility];  Service: Endoscopy;  Laterality: N/A;   ESOPHAGOGASTRODUODENOSCOPY (EGD) WITH PROPOFOL N/A 07/23/2019   Procedure: ESOPHAGOGASTRODUODENOSCOPY (EGD) WITH PROPOFOL with possible banding;  Surgeon: Charlott Rakes, MD;  Location: [facility];  Service: Endoscopy;  Laterality: N/A;   ESOPHAGOGASTRODUODENOSCOPY (EGD) WITH PROPOFOL N/A 09/20/2019   Procedure: ESOPHAGOGASTRODUODENOSCOPY (EGD) WITH PROPOFOL;  Surgeon: Charlott Rakes, MD;  Location: [facility];  Service: Endoscopy;  Laterality: N/A;   ESOPHAGOGASTRODUODENOSCOPY  (EGD) WITH PROPOFOL N/A 12/21/2019   Procedure: ESOPHAGOGASTRODUODENOSCOPY (EGD) WITH PROPOFOL with possible banding;  Surgeon: Charlott Rakes, MD;  Location: [facility];  Service: Endoscopy;  Laterality: N/A;   HERNIA REPAIR     TUBAL LIGATION  07/25/2000   VENTRAL HERNIA REPAIR N/A 07/06/2020   Procedure: VENTRAL HERNIA REPAIR WITH MESH;  Surgeon: Abigail Miyamoto, MD;  Location: [facility] OR;  Service: General;  Laterality: N/A;    Family History  Problem Relation Age of Onset   Diabetes Father    COPD Mother    Esophageal cancer Mother    Diabetes Mother    Cancer Mother        esophageal   Hypertension Mother    Breast cancer Maternal Aunt     Social History Reviewed with no changes to be made today.   Outpatient Medications Prior to Visit  Medication Sig Dispense Refill   Accu-Chek Softclix Lancets lancets USE TO CHECK BLOOD SUGAR THREE TIMES DAILY 300 each 0   acetaminophen (TYLENOL) 500 MG tablet Take 500-1,000 mg by mouth every 6 (six) hours as needed for mild pain.     albuterol (VENTOLIN HFA) 108 (90 Base) MCG/ACT inhaler Inhale 2 puffs into the lungs every 6 (six) hours as needed for wheezing or shortness of breath. 6.7 g 1  azelastine (ASTELIN) 0.1 % nasal spray Place 1 spray into both nostrils 2 (two) times daily. Use in each nostril as directed 30 mL 0   Biotin 16109 MCG TABS Take 10,000 mcg by mouth daily.     Blood Glucose Monitoring Suppl (ACCU-CHEK GUIDE) w/Device KIT Use to check blood sugar three times daily. 1 kit 0   esomeprazole (NEXIUM) 40 MG capsule Take 40 mg by mouth 2 (two) times daily before a meal.     FARXIGA 5 MG TABS tablet TAKE 1 TABLET BY MOUTH EVERY DAY BEFORE BREAKFAST 90 tablet 1   glucose blood (ACCU-CHEK GUIDE) test strip USE TO CHECK BLOOD SUGAR THREE TIMES DAILY 300 strip 0   icosapent Ethyl (VASCEPA) 1 g capsule Take 1 capsule (1 g total) by mouth 2 (two) times daily. 180 capsule 1   ketoconazole (NIZORAL) 2 % cream Apply 1 application   topically daily as needed for irritation (as needed).     loratadine (CLARITIN) 10 MG tablet TAKE 1 TABLET BY MOUTH EVERY DAY 90 tablet 1   losartan (COZAAR) 50 MG tablet Take 1 tablet (50 mg total) by mouth daily. 90 tablet 3   Menthol, Topical Analgesic, (ICY HOT MEDICATED SPRAY) 16 % LIQD Apply 1 spray topically daily as needed (pain).     methocarbamol (ROBAXIN) 500 MG tablet TAKE 1 TABLET BY MOUTH EVERY 8 HOURS AS NEEDED FOR MUSCLE SPASM 60 tablet 0   Microlet Lancets MISC USE UP TO FOUR TIMES DAILY AS DIRECTED 100 each 11   mupirocin ointment (BACTROBAN) 2 % Apply topically 2 (two) times daily. 22 g 0   OZEMPIC, 0.25 OR 0.5 MG/DOSE, 2 MG/3ML SOPN INJECT 0.5MG  INTO THE SKIN ONCE A WEEK 9 mL 1   potassium chloride SA (KLOR-CON M20) 20 MEQ tablet Take 1 tablet (20 mEq total) by mouth daily. 90 tablet 3   propranolol (INDERAL) 20 MG tablet TAKE 1 TABLET BY MOUTH TWICE A DAY 180 tablet 0   rosuvastatin (CRESTOR) 10 MG tablet Take 1 tablet (10 mg total) by mouth daily. 90 tablet 3   zonisamide (ZONEGRAN) 25 MG capsule Take 100 mg by mouth daily.     blood glucose meter kit and supplies KIT Dispense based on patient and insurance preference. Use up to four times daily as directed. (FOR ICD-9 250.00, 250.01). Dispense Relion (Patient taking differently: Dispense based on patient and insurance preference. Use up to four times daily as directed. (FOR ICD-9 250.00, 250.01). Dispense Relion as needed) 1 each 0   ferrous sulfate 325 (65 FE) MG tablet Take 1 tablet (325 mg total) by mouth 2 (two) times daily with a meal. 180 tablet 0   metFORMIN (GLUCOPHAGE) 500 MG tablet TAKE 1 TABLET BY MOUTH 2 TIMES DAILY WITH A MEAL. 180 tablet 1   ondansetron (ZOFRAN-ODT) 4 MG disintegrating tablet Take 1 tablet (4 mg total) by mouth every 8 (eight) hours as needed for vomiting or nausea. 30 tablet 1   sucralfate (CARAFATE) 1 GM/10ML suspension Take 1 g by mouth 4 (four) times daily -  with meals and at bedtime.      triamcinolone (KENALOG) 0.025 % ointment Apply 1 Application topically 2 (two) times daily. 30 g 1   vitamin C (ASCORBIC ACID) 500 MG tablet Take 500 mg by mouth 2 (two) times daily.     amitriptyline (ELAVIL) 25 MG tablet TAKE 1 TABLET BY MOUTH AT BEDTIME 90 tablet 1   ciclopirox (PENLAC) 8 % solution Apply topically at bedtime.  Apply over nail and surrounding skin. Apply daily over previous coat. After seven (7) days, may remove with alcohol and continue cycle. (Patient not taking: Reported on 08/12/2023) 6.6 mL 0   No facility-administered medications prior to visit.    Allergies  Allergen Reactions   Nsaids     GI BLEED       Objective:    BP 131/84   Pulse 60   Ht 5' 4.5" (1.638 m)   Wt 197 lb 12.8 oz (89.7 kg)   SpO2 99%   BMI 33.43 kg/m  Wt Readings from Last 3 Encounters:  09/09/23 197 lb 12.8 oz (89.7 kg)  08/12/23 208 lb (94.3 kg)  07/14/23 209 lb (94.8 kg)    Physical Exam Vitals and nursing note reviewed.  Constitutional:      Appearance: She is well-developed.  HENT:     Head: Normocephalic and atraumatic.  Cardiovascular:     Rate and Rhythm: Normal rate and regular rhythm.     Heart sounds: Normal heart sounds. No murmur heard.    No friction rub. No gallop.  Pulmonary:     Effort: Pulmonary effort is normal. No tachypnea or respiratory distress.     Breath sounds: Normal breath sounds. No decreased breath sounds, wheezing, rhonchi or rales.  Chest:     Chest wall: No tenderness.  Abdominal:     General: Bowel sounds are normal.     Palpations: Abdomen is soft.  Musculoskeletal:        General: Normal range of motion.     Cervical back: Normal range of motion.  Skin:    General: Skin is warm and dry.  Neurological:     Mental Status: She is alert and oriented to person, place, and time.     Coordination: Coordination normal.  Psychiatric:        Behavior: Behavior normal. Behavior is cooperative.        Thought Content: Thought content normal.         Judgment: Judgment normal.          Patient has been counseled extensively about nutrition and exercise as well as the importance of adherence with medications and regular follow-up. The patient was given clear instructions to go to ER or return to medical center if symptoms don't improve, worsen or new problems develop. The patient verbalized understanding.   Follow-up: Return in about 3 months (around 12/10/2023).   Claiborne Rigg, FNP-BC [facility] [place], [place] [phone]   09/21/2023, 10:19 PM

## 2023-09-10 LAB — CMP14+EGFR
ALT: 24 [IU]/L (ref 0–32)
AST: 23 [IU]/L (ref 0–40)
Albumin: 4.6 g/dL (ref 3.9–4.9)
Alkaline Phosphatase: 103 [IU]/L (ref 44–121)
BUN/Creatinine Ratio: 22 (ref 9–23)
BUN: 14 mg/dL (ref 6–24)
Bilirubin Total: 0.7 mg/dL (ref 0.0–1.2)
CO2: 24 mmol/L (ref 20–29)
Calcium: 10 mg/dL (ref 8.7–10.2)
Chloride: 106 mmol/L (ref 96–106)
Creatinine, Ser: 0.64 mg/dL (ref 0.57–1.00)
Globulin, Total: 3.2 g/dL (ref 1.5–4.5)
Glucose: 79 mg/dL (ref 70–99)
Potassium: 4.2 mmol/L (ref 3.5–5.2)
Sodium: 142 mmol/L (ref 134–144)
Total Protein: 7.8 g/dL (ref 6.0–8.5)
eGFR: 110 mL/min/{1.73_m2} (ref >59)

## 2023-09-10 LAB — MAGNESIUM: Magnesium: 2.1 mg/dL (ref 1.6–2.3)

## 2023-09-21 ENCOUNTER — Encounter: Payer: Self-pay | Admitting: Nurse Practitioner

## 2023-09-22 ENCOUNTER — Ambulatory Visit (INDEPENDENT_AMBULATORY_CARE_PROVIDER_SITE_OTHER): Payer: BC Managed Care – PPO

## 2023-09-22 ENCOUNTER — Ambulatory Visit (INDEPENDENT_AMBULATORY_CARE_PROVIDER_SITE_OTHER): Payer: BC Managed Care – PPO | Admitting: Podiatry

## 2023-09-22 ENCOUNTER — Encounter: Payer: Self-pay | Admitting: Nurse Practitioner

## 2023-09-22 DIAGNOSIS — M7752 Other enthesopathy of left foot: Secondary | ICD-10-CM | POA: Diagnosis not present

## 2023-09-22 DIAGNOSIS — L600 Ingrowing nail: Secondary | ICD-10-CM

## 2023-09-22 NOTE — Patient Instructions (Signed)

## 2023-09-22 NOTE — Progress Notes (Signed)
Subjective: No chief complaint on file.   47 year old female presents the office today for concerns of thickening, ingrown toenails and pain along the right first, second toes.  She does not report any drainage or pus.  Tender with pressure.  No redness or swelling.  No injury in the right foot.  She been having some pain on the left ankle.  Is been hurting along the lateral aspect she points to.  No recent treatment otherwise.  Objective: AAO x3, NAD DP/PT pulses palpable bilaterally, CRT less than 3 seconds On the right hallux, first digit toenail nails are hypertrophic, dystrophic and there is incurvation present of the nail borders.  There is no edema, erythema, drainage or pus or any obvious signs of infection noted today. There is tenderness palpation on the anterior aspect of the left ankle.  No area pinpoint tenderness.  Flexor, extensor tendons appear intact.  Majority tenderness is localized on the course of the peroneal tendon as well as the Achilles tendon the left side.  Clinically the tendons appear to be intact. No pain with calf compression, swelling, warmth, erythema  Assessment: Right hallux, first digit ingrown toenail; tendinitis left ankle  Plan: Ingrown toenail -At this time, the patient is requesting total nail removal with chemical matricectomy to the symptomatic portion of the nail. Risks and complications were discussed with the patient for which they understand and written consent was obtained. Under sterile conditions a total of 3 mL of a mixture of 2% lidocaine plain and 0.5% Marcaine plain was infiltrated in a hallux block fashion. Once anesthetized, the skin was prepped in sterile fashion. A tourniquet was then applied. Next the hallux, second digit toenail removed in total making sure remove all nail borders.  Once the nails were ensured to be removed area was debrided and the underlying skin was intact. There is no purulence identified in the procedure. Next phenol  was then applied under standard conditions and copiously irrigated.  Silvadene was applied. A dry sterile dressing was applied. After application of the dressing the tourniquet was removed and there is found to be an immediate capillary refill time to the digit. The patient tolerated the procedure well any complications. Post procedure instructions were discussed the patient for which he verbally understood. Discussed signs/symptoms of infection and directed to call the office immediately should any occur or go directly to the emergency room. In the meantime, encouraged to call the office with any questions, concerns, changes symptoms.  Tendonitis left ankle -We discussed stretching, icing a regular basis.  Unfortunately she cannot tolerate NSAIDs given history of GI bleed.  Discussed traction, rehab exercises as well as supportive shoe gear.  Angela Castillo DPM

## 2023-09-23 ENCOUNTER — Other Ambulatory Visit: Payer: Self-pay | Admitting: Nurse Practitioner

## 2023-09-23 DIAGNOSIS — R202 Paresthesia of skin: Secondary | ICD-10-CM

## 2023-09-23 DIAGNOSIS — R232 Flushing: Secondary | ICD-10-CM

## 2023-09-23 MED ORDER — GABAPENTIN 300 MG PO CAPS
300.0000 mg | ORAL_CAPSULE | Freq: Three times a day (TID) | ORAL | 1 refills | Status: DC
  Filled 2023-09-23: qty 270, 90d supply, fill #0

## 2023-09-24 ENCOUNTER — Other Ambulatory Visit: Payer: Self-pay

## 2023-09-25 ENCOUNTER — Encounter: Payer: Self-pay | Admitting: Podiatry

## 2023-09-27 ENCOUNTER — Other Ambulatory Visit: Payer: Self-pay | Admitting: Nurse Practitioner

## 2023-09-27 DIAGNOSIS — D696 Thrombocytopenia, unspecified: Secondary | ICD-10-CM

## 2023-09-29 DIAGNOSIS — M542 Cervicalgia: Secondary | ICD-10-CM | POA: Diagnosis not present

## 2023-09-29 DIAGNOSIS — G43719 Chronic migraine without aura, intractable, without status migrainosus: Secondary | ICD-10-CM | POA: Diagnosis not present

## 2023-09-29 NOTE — Telephone Encounter (Signed)
Dawn, can you see if she could come in next week or the week after for nail check? I could do a 430.

## 2023-09-30 ENCOUNTER — Other Ambulatory Visit: Payer: Self-pay | Admitting: Gastroenterology

## 2023-09-30 DIAGNOSIS — K7460 Unspecified cirrhosis of liver: Secondary | ICD-10-CM

## 2023-10-04 ENCOUNTER — Other Ambulatory Visit: Payer: Self-pay | Admitting: Nurse Practitioner

## 2023-10-04 DIAGNOSIS — K219 Gastro-esophageal reflux disease without esophagitis: Secondary | ICD-10-CM

## 2023-10-09 ENCOUNTER — Encounter: Payer: Self-pay | Admitting: Nurse Practitioner

## 2023-10-09 ENCOUNTER — Encounter: Payer: Self-pay | Admitting: Family Medicine

## 2023-10-16 ENCOUNTER — Ambulatory Visit (INDEPENDENT_AMBULATORY_CARE_PROVIDER_SITE_OTHER): Payer: BC Managed Care – PPO | Admitting: Podiatry

## 2023-10-16 DIAGNOSIS — L600 Ingrowing nail: Secondary | ICD-10-CM

## 2023-10-16 DIAGNOSIS — M7752 Other enthesopathy of left foot: Secondary | ICD-10-CM | POA: Diagnosis not present

## 2023-10-16 NOTE — Patient Instructions (Signed)

## 2023-10-16 NOTE — Progress Notes (Signed)
Subjective: No chief complaint on file.  47 year old female presents the office today for evaluation of ingrown nail avulsion of the right first toe and second toe.  She states that this has been doing well.  She still thinks she is on her left foot.  Only when she turns her foot a certain direction or certain activities when her symptoms started.  Not sure how this started no injuries.  Objective: AAO x3, NAD DP/PT pulses palpable bilaterally, CRT less than 3 seconds Status post no removal of the right hallux, second digit.  Granulation tissue still present with extremities scab over.  There is no surrounding erythema, ascending size.  No drainage or pus or any signs of infection. She has tenderness palpation subjectively on the anterior lateral aspect of the ankle.  Not able to appreciate any area of tenderness today and there is no edema, erythema.  No area pinpoint tenderness. No pain with calf compression, swelling, warmth, erythema  Assessment: Status post nail avulsion right foot, healing well; likely tenonitis left ankle  Plan: -All treatment options discussed with the patient including all alternatives, risks, complications.  -Right eye is healing well.  Continue soaking of the specimen soap and water daily.  Antibiotic ointment dressing changes daily.  He would be area open at nighttime. -On the left side unfortunately open oral anti-inflammatories.  Discussed Voltaren gel.  Discussed stretching, rehab exercises.  Supportive shoe gear.  If symptoms persist can always consider an MRI. -Patient encouraged to call the office with any questions, concerns, change in symptoms.   Return if symptoms worsen or fail to improve.  Angela Castillo DPM

## 2023-10-20 ENCOUNTER — Other Ambulatory Visit: Payer: BC Managed Care – PPO

## 2023-10-20 ENCOUNTER — Ambulatory Visit
Admission: RE | Admit: 2023-10-20 | Discharge: 2023-10-20 | Disposition: A | Discharge status: Home / Self Care | Payer: BC Managed Care – PPO | Source: Ambulatory Visit | Attending: Gastroenterology | Admitting: Gastroenterology

## 2023-10-20 DIAGNOSIS — K7460 Unspecified cirrhosis of liver: Secondary | ICD-10-CM | POA: Diagnosis not present

## 2023-10-20 DIAGNOSIS — M542 Cervicalgia: Secondary | ICD-10-CM | POA: Diagnosis not present

## 2023-10-20 DIAGNOSIS — G43719 Chronic migraine without aura, intractable, without status migrainosus: Secondary | ICD-10-CM | POA: Diagnosis not present

## 2023-11-03 ENCOUNTER — Ambulatory Visit: Payer: BC Managed Care – PPO | Admitting: Podiatry

## 2023-11-07 ENCOUNTER — Other Ambulatory Visit: Payer: Self-pay | Admitting: Nurse Practitioner

## 2023-11-16 NOTE — Progress Notes (Signed)
 Cardiology Office Note:    Date:  11/19/2023   ID:  Angela Castillo, DOB January 25, 1976, MRN [id number]  PCP:  Theotis Haze ORN, NP  Cardiologist:  None  Electrophysiologist:  None   Referring MD: Theotis Haze ORN, NP   Chief Complaint  Patient presents with   Coronary Artery Disease    History of Present Illness:    Angela Castillo is a 48 y.o. female with a hx of T2DM, hypertension, cirrhosis who presents for follow-up.  She was referred by Haze Theotis, NP for evaluation of chest pain, initially seen 07/14/2023.  Seen in ED on 7/1 for chest pain.  Workup unremarkable.  She reports having chest pain few times per week.  She reports pain across middle of chest, describes as chest pressure, last for hours.  Occurs at rest.  Reports she does not exercise.  Reports occasional shortness of breath and some lightheadedness but denies any syncope.  No lower extremity edema.  No smoking history.  No family history of heart disease.  Coronary CTA 07/28/2023 showed minimal nonobstructive CAD, calcium  score 31 (98th percentile), artifact which precluded full examination of distal LAD and LCx.  Echocardiogram 07/30/2023 showed EF 60 to 65%, normal RV function, no significant valvular disease.  Since last clinic visit, she reports she is doing okay.  Continues to have intermittent chest pain and some shortness of breath.  He has not been exercising but is active at her job.  Past Medical History:  Diagnosis Date   Anemia Dx January 03 2015   Diabetes mellitus without complication (HCC)    Elevated liver enzymes    Essential hypertension Dx Apr 2016   Fatty liver    H/O: upper GI bleed 03/2019    variceal bleed    Headache    History of blood transfusion 03/2019   Liver cirrhosis secondary to NASH (HCC)    Neuropathy    Pneumonia 03/2019   Reflux    Thrombocytopenia (HCC)     Past Surgical History:  Procedure Laterality Date   DILITATION & CURRETTAGE/HYSTROSCOPY WITH  HYDROTHERMAL ABLATION N/A 11/20/2021   Procedure: DILATATION & CURETTAGE/HYSTEROSCOPY WITH HYDROTHERMAL ABLATION;  Surgeon: Fredirick Glenys RAMAN, MD;  Location: [facility];  Service: Gynecology;  Laterality: N/A;  rep will be here confirmed on 11/12/21 CS   ESOPHAGEAL BANDING  03/22/2019   Procedure: ESOPHAGEAL BANDING;  Surgeon: Dianna Specking, MD;  Location: [facility];  Service: Endoscopy;;   ESOPHAGEAL BANDING  03/23/2019   Procedure: ESOPHAGEAL BANDING;  Surgeon: Dianna Specking, MD;  Location: [facility];  Service: Endoscopy;;   ESOPHAGEAL BANDING  09/20/2019   Procedure: ESOPHAGEAL BANDING;  Surgeon: Dianna Specking, MD;  Location: [facility];  Service: Endoscopy;;   ESOPHAGEAL BANDING N/A 12/21/2019   Procedure: ESOPHAGEAL BANDING;  Surgeon: Dianna Specking, MD;  [facility]: [facility];  Service: Endoscopy;  Laterality: N/A;   ESOPHAGOGASTRODUODENOSCOPY N/A 03/22/2019   Procedure: ESOPHAGOGASTRODUODENOSCOPY (EGD);  Surgeon: Dianna Specking, MD;  Location: [facility];  Service: Endoscopy;  Laterality: N/A;   ESOPHAGOGASTRODUODENOSCOPY N/A 03/23/2019   Procedure: ESOPHAGOGASTRODUODENOSCOPY (EGD);  Surgeon: Dianna Specking, MD;  Location: [facility];  Service: Endoscopy;  Laterality: N/A;   ESOPHAGOGASTRODUODENOSCOPY (EGD) WITH PROPOFOL  N/A 07/23/2019   Procedure: ESOPHAGOGASTRODUODENOSCOPY (EGD) WITH PROPOFOL  with possible banding;  Surgeon: Dianna Specking, MD;  [facility];  Service: Endoscopy;  Laterality: N/A;   ESOPHAGOGASTRODUODENOSCOPY (EGD) WITH PROPOFOL  N/A 09/20/2019   Procedure: ESOPHAGOGASTRODUODENOSCOPY (EGD) WITH PROPOFOL ;  Surgeon: Dianna Specking, MD;  [facility];  Service: Endoscopy;  Laterality: N/A;   ESOPHAGOGASTRODUODENOSCOPY (EGD) WITH PROPOFOL  N/A 12/21/2019   Procedure: ESOPHAGOGASTRODUODENOSCOPY (EGD) WITH PROPOFOL  with possible banding;  Surgeon: Dianna Specking, MD;  [facility];  Service: Endoscopy;  Laterality:  N/A;   HERNIA REPAIR     TUBAL LIGATION  07/25/2000   VENTRAL HERNIA REPAIR N/A 07/06/2020   Procedure: VENTRAL HERNIA REPAIR WITH MESH;  Surgeon: Vernetta Berg, MD;  Location: [facility];  Service: General;  Laterality: N/A;    Current Medications: Current Meds  Medication Sig   Accu-Chek Softclix Lancets lancets USE TO CHECK BLOOD SUGAR THREE TIMES DAILY   acetaminophen  (TYLENOL ) 500 MG tablet Take 500-1,000 mg by mouth every 6 (six) hours as needed for mild pain.   albuterol  (VENTOLIN  HFA) 108 (90 Base) MCG/ACT inhaler Inhale 2 puffs into the lungs every 6 (six) hours as needed for wheezing or shortness of breath.   azelastine  (ASTELIN ) 0.1 % nasal spray Place 1 spray into both nostrils 2 (two) times daily. Use in each nostril as directed   Biotin 89999 MCG TABS Take 10,000 mcg by mouth daily.   Blood Glucose Monitoring Suppl (ACCU-CHEK GUIDE) w/Device KIT Use to check blood sugar three times daily.   esomeprazole  (NEXIUM ) 40 MG capsule Take 40 mg by mouth 2 (two) times daily before a meal.   FARXIGA  5 MG TABS tablet TAKE 1 TABLET BY MOUTH EVERY DAY BEFORE BREAKFAST   ferrous sulfate  325 (65 FE) MG tablet Take 1 tablet (325 mg total) by mouth 2 (two) times daily with a meal.   gabapentin  (NEURONTIN ) 300 MG capsule Take 1 capsule (300 mg total) by mouth 3 (three) times daily. For hot flashes   glucose blood (ACCU-CHEK GUIDE) test strip USE TO CHECK BLOOD SUGAR THREE TIMES DAILY   ketoconazole  (NIZORAL ) 2 % cream Apply 1 application  topically daily as needed for irritation (as needed).   Menthol, Topical Analgesic, (ICY HOT MEDICATED SPRAY) 16 % LIQD Apply 1 spray topically daily as needed (pain).   metFORMIN  (GLUCOPHAGE ) 500 MG tablet Take 1 tablet (500 mg total) by mouth 2 (two) times daily with a meal.   methocarbamol  (ROBAXIN ) 500 MG tablet TAKE 1 TABLET BY MOUTH EVERY 8 HOURS AS NEEDED FOR MUSCLE SPASM   Microlet Lancets MISC USE UP TO FOUR TIMES DAILY AS DIRECTED   mupirocin  ointment  (BACTROBAN ) 2 % Apply topically 2 (two) times daily.   ondansetron  (ZOFRAN -ODT) 4 MG disintegrating tablet DISSOLVE 1 TABLET(4 MG) ON THE TONGUE EVERY 8 HOURS AS NEEDED FOR VOMITING OR NAUSEA   OZEMPIC , 0.25 OR 0.5 MG/DOSE, 2 MG/3ML SOPN INJECT 0.5MG  INTO THE SKIN ONCE A WEEK   propranolol  (INDERAL ) 20 MG tablet TAKE 1 TABLET BY MOUTH TWICE A DAY   rosuvastatin  (CRESTOR ) 10 MG tablet Take 1 tablet (10 mg total) by mouth daily.   VASCEPA  1 g capsule TAKE 1 CAPSULE(1 GRAM) BY MOUTH TWICE DAILY   zonisamide (ZONEGRAN) 25 MG capsule Take 100 mg by mouth daily.     Allergies:   Nsaids   Social History   Socioeconomic History   Marital status: Married    Spouse name: Not on file   Number of children: 2   Years of education: Not on file   Highest education level: 12th grade  Occupational History    Comment: security guard at [employer]  Tobacco Use   Smoking status: Never   Smokeless tobacco: Never  Vaping Use   Vaping status: Never Used  Substance and Sexual Activity  Alcohol use: Not Currently    Comment: rarely-last alcohol drank in 08/2018   Drug use: No   Sexual activity: Not Currently    Birth control/protection: Surgical    Comment: BTL in 2021  Other Topics Concern   Not on file  Social History Narrative   Not on file   Social Drivers of Health   Financial Resource Strain: Low Risk  (09/02/2023)   Overall Financial Resource Strain (CARDIA)    Difficulty of Paying Living Expenses: Not very hard  Food Insecurity: No Food Insecurity (09/02/2023)   Hunger Vital Sign    Worried About Running Out of Food in the Last Year: Never true    Ran Out of Food in the Last Year: Never true  Transportation Needs: No Transportation Needs (09/02/2023)   PRAPARE - Administrator, Civil Service (Medical): No    Lack of Transportation (Non-Medical): No  Physical Activity: Insufficiently Active (09/02/2023)   Exercise Vital Sign    Days of Exercise per Week: 1 day     Minutes of Exercise per Session: 10 min  Stress: No Stress Concern Present (09/02/2023)   [employer] of Occupational Health - Occupational Stress Questionnaire    Feeling of Stress : Only a little  Social Connections: Moderately Integrated (09/02/2023)   Social Connection and Isolation Panel [NHANES]    Frequency of Communication with Friends and Family: Twice a week    Frequency of Social Gatherings with Friends and Family: Once a week    Attends Religious Services: 1 to 4 times per year    Active Member of [employer] or Organizations: No    Attends Engineer, Structural: Not on file    Marital Status: Married     Family History: The patient's family history includes Breast cancer in her maternal aunt; COPD in her mother; Cancer in her mother; Diabetes in her father and mother; Esophageal cancer in her mother; Hypertension in her mother.  ROS:   Please see the history of present illness.     All other systems reviewed and are negative.  EKGs/Labs/Other Studies Reviewed:    The following studies were reviewed today:   EKG:   07/14/2023: Normal sinus rhythm, poor R wave progression, rate 65  Recent Labs: 07/01/2023: Hemoglobin 12.8; Platelet Count 83 09/09/2023: ALT 24; BUN 14; Creatinine, Ser 0.64; Magnesium  2.1; Potassium 4.2; Sodium 142  Recent Lipid Panel    Component Value Date/Time   CHOL 120 07/15/2023 1357   TRIG 121 07/15/2023 1357   HDL 33 (L) 07/15/2023 1357   CHOLHDL 3.6 07/15/2023 1357   CHOLHDL 8.0 (H) 06/12/2015 1756   VLDL 28 06/12/2015 1756   LDLCALC 65 07/15/2023 1357    Physical Exam:    VS:  BP 126/74 (BP Location: Right Arm, Patient Position: Sitting, Cuff Size: Normal)   Pulse 60   Ht 5' 4 (1.626 m)   Wt 194 lb 12.8 oz (88.4 kg)   LMP 10/24/2021   SpO2 98%   BMI 33.44 kg/m     Wt Readings from Last 3 Encounters:  11/18/23 194 lb 12.8 oz (88.4 kg)  09/09/23 197 lb 12.8 oz (89.7 kg)  08/12/23 208 lb (94.3 kg)     GEN:  Well  nourished, well developed in no acute distress HEENT: Normal NECK: No JVD; No carotid bruits CARDIAC: RRR, no murmurs, rubs, gallops RESPIRATORY:  Clear to auscultation without rales, wheezing or rhonchi  ABDOMEN: Soft, non-tender, non-distended MUSCULOSKELETAL:  No edema; No deformity  SKIN: Warm and dry NEUROLOGIC:  Alert and oriented x 3 PSYCHIATRIC:  Normal affect   ASSESSMENT:    1. Coronary artery disease involving native coronary artery of native heart without angina pectoris   2. Essential hypertension   3. Hyperlipidemia, unspecified hyperlipidemia type     PLAN:    CAD: Reported atypical chest pain.  Coronary CTA 07/28/2023 showed minimal nonobstructive CAD, calcium  score 31 (98th percentile), artifact which precluded full examination of distal LAD and LCx.  Echocardiogram 07/30/2023 showed EF 60 to 65%, normal RV function, no significant valvular disease. -Continue rosuvastatin  10 mg daily  Hypertension: On losartan -HCTZ 50-12.5 mg daily, propranolol  20 mg twice daily.  Appears controlled  Hyperlipidemia: On rosuvastatin  10 mg daily.  LDL 65 on 07/15/2023  T2DM: On Farxiga  and metformin .  Last A1c 5.8% on 06/09/23  Cirrhosis: follows with [facility]  [facility] in 1 year  Medication Adjustments/Labs and Tests Ordered: Current medicines are reviewed at length with the patient today.  Concerns regarding medicines are outlined above.  No orders of the defined types were placed in this encounter.  No orders of the defined types were placed in this encounter.   Patient Instructions  Medication Instructions:  Your physician recommends that you continue on your current medications as directed. Please refer to the Current Medication list given to you today.    *If you need a refill on your cardiac medications before your next appointment, please call your pharmacy*   Lab Work: NONE    If you have labs (blood work) drawn today and your tests are completely normal, you will  receive your results only by: MyChart Message (if you have MyChart) OR A paper copy in the mail If you have any lab test that is abnormal or we need to change your treatment, we will call you to review the results.   Testing/Procedures: NONE    Follow-Up: At [facility], you and your health needs are our priority.  As part of our continuing mission to provide you with exceptional heart care, we have created designated Provider Care Teams.  These Care Teams include your primary Cardiologist (physician) and Advanced Practice Providers (APPs -  Physician Assistants and Nurse Practitioners) who all work together to provide you with the care you need, when you need it.  We recommend signing up for the patient portal called MyChart.  Sign up information is provided on this After Visit Summary.  MyChart is used to connect with patients for Virtual Visits (Telemedicine).  Patients are able to view lab/test results, encounter notes, upcoming appointments, etc.  Non-urgent messages can be sent to your provider as well.   To learn more about what you can do with MyChart, go to [email or web address].    Your next appointment:   1 year(s)  The format for your next appointment:   In Person  Provider:   Lonni Nanas, MD    Other Instructions    Signed, Lonni LITTIE Nanas, MD  11/19/2023 5:52 AM    [facility]

## 2023-11-17 ENCOUNTER — Encounter: Payer: Self-pay | Admitting: Podiatry

## 2023-11-17 ENCOUNTER — Ambulatory Visit (INDEPENDENT_AMBULATORY_CARE_PROVIDER_SITE_OTHER): Payer: BC Managed Care – PPO | Admitting: Podiatry

## 2023-11-17 DIAGNOSIS — M7750 Other enthesopathy of unspecified foot: Secondary | ICD-10-CM

## 2023-11-17 DIAGNOSIS — M7752 Other enthesopathy of left foot: Secondary | ICD-10-CM

## 2023-11-17 DIAGNOSIS — M542 Cervicalgia: Secondary | ICD-10-CM | POA: Diagnosis not present

## 2023-11-17 DIAGNOSIS — G43719 Chronic migraine without aura, intractable, without status migrainosus: Secondary | ICD-10-CM | POA: Diagnosis not present

## 2023-11-17 MED ORDER — TRIAMCINOLONE ACETONIDE 10 MG/ML IJ SUSP
5.0000 mg | Freq: Once | INTRAMUSCULAR | Status: AC
  Administered 2023-11-17: 5 mg via INTRAMUSCULAR

## 2023-11-17 NOTE — Patient Instructions (Signed)

## 2023-11-18 ENCOUNTER — Encounter: Payer: Self-pay | Admitting: Cardiology

## 2023-11-18 ENCOUNTER — Ambulatory Visit: Payer: BC Managed Care – PPO | Attending: Cardiology | Admitting: Cardiology

## 2023-11-18 VITALS — BP 126/74 | HR 60 | Ht 64.0 in | Wt 194.8 lb

## 2023-11-18 DIAGNOSIS — I10 Essential (primary) hypertension: Secondary | ICD-10-CM

## 2023-11-18 DIAGNOSIS — E785 Hyperlipidemia, unspecified: Secondary | ICD-10-CM

## 2023-11-18 DIAGNOSIS — I2510 Atherosclerotic heart disease of native coronary artery without angina pectoris: Secondary | ICD-10-CM

## 2023-11-18 NOTE — Patient Instructions (Signed)
 Medication Instructions:  Your physician recommends that you continue on your current medications as directed. Please refer to the Current Medication list given to you today.    *If you need a refill on your cardiac medications before your next appointment, please call your pharmacy*   Lab Work: NONE    If you have labs (blood work) drawn today and your tests are completely normal, you will receive your results only by: MyChart Message (if you have MyChart) OR A paper copy in the mail If you have any lab test that is abnormal or we need to change your treatment, we will call you to review the results.   Testing/Procedures: NONE    Follow-Up: At [facility], you and your health needs are our priority.  As part of our continuing mission to provide you with exceptional heart care, we have created designated Provider Care Teams.  These Care Teams include your primary Cardiologist (physician) and Advanced Practice Providers (APPs -  Physician Assistants and Nurse Practitioners) who all work together to provide you with the care you need, when you need it.  We recommend signing up for the patient portal called MyChart.  Sign up information is provided on this After Visit Summary.  MyChart is used to connect with patients for Virtual Visits (Telemedicine).  Patients are able to view lab/test results, encounter notes, upcoming appointments, etc.  Non-urgent messages can be sent to your provider as well.   To learn more about what you can do with MyChart, go to [email or web address].    Your next appointment:   1 year(s)  The format for your next appointment:   In Person  Provider:   Lonni Nanas, MD    Other Instructions

## 2023-11-23 NOTE — Progress Notes (Signed)
Subjective: Chief Complaint  Patient presents with   Foot Pain    RM#13 Follow up on left foot pain patient states not any better from last visit on the outside of heel.    48 year old female presents the office today for follow-up evaluation of left foot pain.  She states she still gets pain point to the lateral aspect of the left side more on the heel.  She gets discomfort and she states that hurts if she turns her foot a certain direction.  She does not recall any injuries of this started.  No present swelling.   Objective: AAO x3, NAD DP/PT pulses palpable bilaterally, CRT less than 3 seconds Left: + Slight tenderness on the anterior aspect ankle joint tenderness today is more to the plantar lateral aspect of the heel.  There is no pain with lateral compression of calcaneus.  No pain to the posterior calcaneus, Achilles tendon.  There is no edema, erythema.  Flexor, extensor tendons appear to be intact.  MMT 5/5. No pain with calf compression, swelling, warmth, erythema  Assessment: Status post nail avulsion right foot, healing well; likely tenonitis left ankle  Plan: -All treatment options discussed with the patient including all alternatives, risks, complications.  -Given her discomfort we discussed steroid injection she wishes to proceed with this today.  I cleaned the skin with alcohol.  A mixture of 1 cc Kenalog 10, 0.5 cc of Marcaine plain, 05 cc of lidocaine plain was infiltrated into the area of maximal tenderness bilaterally UA complications.  Postinjection care discussed.  Tolerated well. -We discussed that she exercises daily.  Discussed recommended arch support. -If symptoms persist consider MRI.  Return in about 2 weeks (around 12/01/2023) for left foot pain.  Vivi Barrack DPM

## 2023-12-03 ENCOUNTER — Encounter: Payer: Self-pay | Admitting: Nurse Practitioner

## 2023-12-03 DIAGNOSIS — R0981 Nasal congestion: Secondary | ICD-10-CM | POA: Diagnosis not present

## 2023-12-03 DIAGNOSIS — H9203 Otalgia, bilateral: Secondary | ICD-10-CM | POA: Diagnosis not present

## 2023-12-03 DIAGNOSIS — H6503 Acute serous otitis media, bilateral: Secondary | ICD-10-CM | POA: Diagnosis not present

## 2023-12-04 ENCOUNTER — Encounter: Payer: Self-pay | Admitting: Podiatry

## 2023-12-04 ENCOUNTER — Ambulatory Visit (INDEPENDENT_AMBULATORY_CARE_PROVIDER_SITE_OTHER): Payer: BC Managed Care – PPO | Admitting: Podiatry

## 2023-12-04 ENCOUNTER — Other Ambulatory Visit: Payer: Self-pay | Admitting: Nurse Practitioner

## 2023-12-04 DIAGNOSIS — M7750 Other enthesopathy of unspecified foot: Secondary | ICD-10-CM

## 2023-12-04 DIAGNOSIS — E785 Hyperlipidemia, unspecified: Secondary | ICD-10-CM

## 2023-12-04 NOTE — Telephone Encounter (Signed)
Requested by interface surescripts. Future visit in 6 days.  Requested Prescriptions  Pending Prescriptions Disp Refills   rosuvastatin (CRESTOR) 10 MG tablet [Pharmacy Med Name: ROSUVASTATIN 10MG  TABLETS] 90 tablet 0    Sig: TAKE 1 TABLET(10 MG) BY MOUTH DAILY     Cardiovascular:  Antilipid - Statins 2 Failed - 12/04/2023  1:29 PM      Failed - Lipid Panel in normal range within the last 12 months    Cholesterol, Total  Date Value Ref Range Status  07/15/2023 120 100 - 199 mg/dL Final   LDL Chol Calc (NIH)  Date Value Ref Range Status  07/15/2023 65 0 - 99 mg/dL Final   HDL  Date Value Ref Range Status  07/15/2023 33 (L) >39 mg/dL Final   Triglycerides  Date Value Ref Range Status  07/15/2023 121 0 - 149 mg/dL Final         Passed - Cr in normal range and within 360 days    Creat  Date Value Ref Range Status  06/21/2016 0.68 0.50 - 1.10 mg/dL Final   Creatinine, Ser  Date Value Ref Range Status  09/09/2023 0.64 0.57 - 1.00 mg/dL Final         Passed - Patient is not pregnant      Passed - Valid encounter within last 12 months    Recent Outpatient Visits           2 months ago Primary hypertension   [facility] [place] Claiborne Rigg, NP   5 months ago Encounter for annual physical exam   [facility] [place] Claiborne Rigg, NP   9 months ago Primary hypertension   [facility] [place]. [facility] Claiborne Rigg, NP   12 months ago Type 2 diabetes mellitus with unspecified complications [facility])   [facility] [place]. [facility] Claiborne Rigg, NP   1 year ago Encounter for Papanicolaou smear for cervical cancer screening   [facility] Of [place]. [facility] Claiborne Rigg, NP       Future Appointments             In  6 days Claiborne Rigg, NP [facility] Of Eligha Bridegroom. [facility]

## 2023-12-04 NOTE — Patient Instructions (Signed)

## 2023-12-07 NOTE — Progress Notes (Signed)
Subjective: Chief Complaint  Patient presents with   Foot Pain    RM#11 Left foot pain wants to be evaluated pain is located on the outer part of the foot into heel.      48 year old female presents the office today for follow-up evaluation of left foot pain.  She said the injection was helpful but started to become tender again over the last day or so.  No recent injuries.  Today she points more on the Achilles tendon where she gets discomfort.  No recent injury or changes otherwise since also her last.  No new concerns.    Objective: AAO x3, NAD DP/PT pulses palpable bilaterally, CRT less than 3 seconds Left: Treatment with retention of the distal portion Achilles tendon.  Clinically the tendon appears to be intact.  There is no edema, erythema to this area.  She does get some discomfort on the plantar lateral aspect of heel there is also been some discomfort on course the peroneal tendon posterior inferior to lateral malleolus.  Trace edema is present but no erythema or warmth.  There is no area pinpoint tenderness. Flexor, extensor tendons appear to be intact.  MMT 5/5. No pain with calf compression, swelling, warmth, erythema  Assessment: Left ankle tendinitis  Plan: -All treatment options discussed with the patient including all alternatives, risks, complications.  -We discussed different treatment options including ordering MRI.  This was ordered today given ongoing nature of the symptoms. -We discussed stretching, icing on a regular basis.  We discussed topical Voltaren gel as she is not able to do oral anti-inflammatories although she does state that she takes them occasionally.  Given her history would advise going on oral anti-inflammatories.  Continue shoes, good arch support and bracing if needed.  Return for MRI results.  Vivi Barrack DPM

## 2023-12-08 DIAGNOSIS — M542 Cervicalgia: Secondary | ICD-10-CM | POA: Diagnosis not present

## 2023-12-08 DIAGNOSIS — G43719 Chronic migraine without aura, intractable, without status migrainosus: Secondary | ICD-10-CM | POA: Diagnosis not present

## 2023-12-10 ENCOUNTER — Other Ambulatory Visit: Payer: Self-pay | Admitting: Nurse Practitioner

## 2023-12-10 ENCOUNTER — Encounter: Payer: Self-pay | Admitting: Nurse Practitioner

## 2023-12-10 ENCOUNTER — Ambulatory Visit: Payer: BC Managed Care – PPO | Attending: Nurse Practitioner | Admitting: Nurse Practitioner

## 2023-12-10 VITALS — BP 134/83 | HR 65 | Resp 20 | Ht 64.0 in | Wt 191.4 lb

## 2023-12-10 DIAGNOSIS — Z7984 Long term (current) use of oral hypoglycemic drugs: Secondary | ICD-10-CM | POA: Diagnosis not present

## 2023-12-10 DIAGNOSIS — M2550 Pain in unspecified joint: Secondary | ICD-10-CM | POA: Diagnosis not present

## 2023-12-10 DIAGNOSIS — D696 Thrombocytopenia, unspecified: Secondary | ICD-10-CM | POA: Diagnosis not present

## 2023-12-10 DIAGNOSIS — R232 Flushing: Secondary | ICD-10-CM

## 2023-12-10 DIAGNOSIS — E118 Type 2 diabetes mellitus with unspecified complications: Secondary | ICD-10-CM

## 2023-12-10 DIAGNOSIS — Z7985 Long-term (current) use of injectable non-insulin antidiabetic drugs: Secondary | ICD-10-CM

## 2023-12-10 LAB — POCT GLYCOSYLATED HEMOGLOBIN (HGB A1C): HbA1c, POC (controlled diabetic range): 5.5 % (ref 0.0–7.0)

## 2023-12-10 MED ORDER — METHOCARBAMOL 500 MG PO TABS: 500.0000 mg | ORAL_TABLET | Freq: Three times a day (TID) | ORAL | 3 refills | Status: DC | PRN

## 2023-12-10 MED ORDER — DICLOFENAC SODIUM 1 % EX GEL: 4.0000 g | Freq: Four times a day (QID) | CUTANEOUS | 1 refills | Status: AC

## 2023-12-10 NOTE — Progress Notes (Signed)
 .  Assessment & Plan:  [name] was seen today for medical management of chronic issues and diarrhea.  Diagnoses and all orders for this visit:  Type 2 diabetes mellitus with unspecified complications (HCC) -     POCT glycosylated hemoglobin (Hb A1C) -     Urine Albumin/Creatinine with ratio (send out) [LAB689] Continue to eat healthy, well balanced meals. Stay hydrated by drinking water  Schedule 3 month follow up appt  Low platelet count (HCC) -     CBC with Differential Will call patient with lab results  Cervical pain (neck) -     methocarbamol  (ROBAXIN ) 500 MG tablet; Take 1 tablet (500 mg total) by mouth every 8 (eight) hours as needed for muscle spasms.  avoid lifting heavy objects or overuse May use ice or heat to area as needed for to help relieve pain Follow up prn if pain worsens       Patient has been counseled on age-appropriate routine health concerns for screening and prevention. These are reviewed and up-to-date. Referrals have been placed accordingly. Immunizations are up-to-date or declined.    Subjective:   Chief Complaint  Patient presents with  . Medical Management of Chronic Issues  . Diarrhea    Angela Castillo 48 y.o. female presents to office today for follow up on Hgb A1C and recent fall on yesterday with pain and tenderness in right shoulder and thigh States she was trying to avoid her dogs from fighting and accidentally fell to the floor landing on right upper shoulder and arm. Taking Tylenol  for pain with some relief of pain. Able to move arm in full range of motion. States diarrhea has resolved with Imodium.    Review of Systems  HENT: Negative.    Respiratory: Negative.    Gastrointestinal:  Negative for diarrhea.  Musculoskeletal:        Right shoulder pain and tenderness Pain in right thigh     Past Medical History:  Diagnosis Date  . Anemia Dx January 03 2015  . Diabetes mellitus without complication (HCC)   . Elevated liver  enzymes   . Essential hypertension Dx Apr 2016  . Fatty liver   . H/O: upper GI bleed 03/2019    variceal bleed   . Headache   . History of blood transfusion 03/2019  . Liver cirrhosis secondary to NASH (HCC)   . Neuropathy   . Pneumonia 03/2019  . Reflux   . Thrombocytopenia (HCC)     Past Surgical History:  Procedure Laterality Date  . DILITATION & CURRETTAGE/HYSTROSCOPY WITH HYDROTHERMAL ABLATION N/A 11/20/2021   Procedure: DILATATION & CURETTAGE/HYSTEROSCOPY WITH HYDROTHERMAL ABLATION;  Surgeon: Fredirick Glenys RAMAN, MD;  Location: [facility] OR;  Service: Gynecology;  Laterality: N/A;  rep will be here confirmed on 11/12/21 CS  . ESOPHAGEAL BANDING  03/22/2019   Procedure: ESOPHAGEAL BANDING;  Surgeon: Dianna Specking, MD;  Location: [facility];  Service: Endoscopy;;  . ESOPHAGEAL BANDING  03/23/2019   Procedure: ESOPHAGEAL BANDING;  Surgeon: Dianna Specking, MD;  Location: [facility];  Service: Endoscopy;;  . ESOPHAGEAL BANDING  09/20/2019   Procedure: ESOPHAGEAL BANDING;  Surgeon: Dianna Specking, MD;  Location: [facility];  Service: Endoscopy;;  . ESOPHAGEAL BANDING N/A 12/21/2019   Procedure: ESOPHAGEAL BANDING;  Surgeon: Dianna Specking, MD;  Location: [facility];  Service: Endoscopy;  Laterality: N/A;  . ESOPHAGOGASTRODUODENOSCOPY N/A 03/22/2019   Procedure: ESOPHAGOGASTRODUODENOSCOPY (EGD);  Surgeon: Dianna Specking, MD;  Location: [facility];  Service: Endoscopy;  Laterality: N/A;  . ESOPHAGOGASTRODUODENOSCOPY N/A  03/23/2019   Procedure: ESOPHAGOGASTRODUODENOSCOPY (EGD);  Surgeon: Dianna Specking, MD;  Location: [facility];  Service: Endoscopy;  Laterality: N/A;  . ESOPHAGOGASTRODUODENOSCOPY (EGD) WITH PROPOFOL  N/A 07/23/2019   Procedure: ESOPHAGOGASTRODUODENOSCOPY (EGD) WITH PROPOFOL  with possible banding;  Surgeon: Dianna Specking, MD;  Location: [facility];  Service: Endoscopy;  Laterality: N/A;  . ESOPHAGOGASTRODUODENOSCOPY (EGD) WITH PROPOFOL  N/A  09/20/2019   Procedure: ESOPHAGOGASTRODUODENOSCOPY (EGD) WITH PROPOFOL ;  Surgeon: Dianna Specking, MD;  Location: [facility];  Service: Endoscopy;  Laterality: N/A;  . ESOPHAGOGASTRODUODENOSCOPY (EGD) WITH PROPOFOL  N/A 12/21/2019   Procedure: ESOPHAGOGASTRODUODENOSCOPY (EGD) WITH PROPOFOL  with possible banding;  Surgeon: Dianna Specking, MD;  Location: [facility];  Service: Endoscopy;  Laterality: N/A;  . HERNIA REPAIR    . TUBAL LIGATION  07/25/2000  . VENTRAL HERNIA REPAIR N/A 07/06/2020   Procedure: VENTRAL HERNIA REPAIR WITH MESH;  Surgeon: Vernetta Berg, MD;  Location: [facility] OR;  Service: General;  Laterality: N/A;    Family History  Problem Relation Age of Onset  . Diabetes Father   . COPD Mother   . Esophageal cancer Mother   . Diabetes Mother   . Cancer Mother        esophageal  . Hypertension Mother   . Breast cancer Maternal Aunt     Social History Reviewed with no changes to be made today.   Outpatient Medications Prior to Visit  Medication Sig Dispense Refill  . Accu-Chek Softclix Lancets lancets USE TO CHECK BLOOD SUGAR THREE TIMES DAILY 300 each 0  . acetaminophen  (TYLENOL ) 500 MG tablet Take 500-1,000 mg by mouth every 6 (six) hours as needed for mild pain.    . albuterol  (VENTOLIN  HFA) 108 (90 Base) MCG/ACT inhaler Inhale 2 puffs into the lungs every 6 (six) hours as needed for wheezing or shortness of breath. 6.7 g 1  . azelastine  (ASTELIN ) 0.1 % nasal spray Place 1 spray into both nostrils 2 (two) times daily. Use in each nostril as directed 30 mL 0  . Biotin 89999 MCG TABS Take 10,000 mcg by mouth daily.    . Blood Glucose Monitoring Suppl (ACCU-CHEK GUIDE) w/Device KIT Use to check blood sugar three times daily. 1 kit 0  . esomeprazole  (NEXIUM ) 40 MG capsule Take 40 mg by mouth 2 (two) times daily before a meal.    . FARXIGA  5 MG TABS tablet TAKE 1 TABLET BY MOUTH EVERY DAY BEFORE BREAKFAST 90 tablet 1  . ferrous sulfate  325 (65 FE) MG tablet Take 1  tablet (325 mg total) by mouth 2 (two) times daily with a meal. 180 tablet 2  . gabapentin  (NEURONTIN ) 300 MG capsule Take 1 capsule (300 mg total) by mouth 3 (three) times daily. For hot flashes 270 capsule 1  . glucose blood (ACCU-CHEK GUIDE) test strip USE TO CHECK BLOOD SUGAR THREE TIMES DAILY 300 strip 0  . ketoconazole  (NIZORAL ) 2 % cream Apply 1 application  topically daily as needed for irritation (as needed).    . loratadine  (CLARITIN ) 10 MG tablet TAKE 1 TABLET BY MOUTH EVERY DAY 90 tablet 1  . Menthol, Topical Analgesic, (ICY HOT MEDICATED SPRAY) 16 % LIQD Apply 1 spray topically daily as needed (pain).    . metFORMIN  (GLUCOPHAGE ) 500 MG tablet Take 1 tablet (500 mg total) by mouth 2 (two) times daily with a meal. 180 tablet 1  . Microlet Lancets MISC USE UP TO FOUR TIMES DAILY AS DIRECTED 100 each 11  . mupirocin  ointment (BACTROBAN ) 2 % Apply topically 2 (two) times daily.  22 g 0  . ondansetron  (ZOFRAN -ODT) 4 MG disintegrating tablet DISSOLVE 1 TABLET(4 MG) ON THE TONGUE EVERY 8 HOURS AS NEEDED FOR VOMITING OR NAUSEA 30 tablet 1  . OZEMPIC , 0.25 OR 0.5 MG/DOSE, 2 MG/3ML SOPN INJECT 0.5MG  INTO THE SKIN ONCE A WEEK 9 mL 1  . propranolol  (INDERAL ) 20 MG tablet TAKE 1 TABLET BY MOUTH TWICE A DAY 180 tablet 0  . rosuvastatin  (CRESTOR ) 10 MG tablet TAKE 1 TABLET(10 MG) BY MOUTH DAILY 90 tablet 0  . VASCEPA  1 g capsule TAKE 1 CAPSULE(1 GRAM) BY MOUTH TWICE DAILY 180 capsule 1  . zonisamide (ZONEGRAN) 25 MG capsule Take 100 mg by mouth daily.    . methocarbamol  (ROBAXIN ) 500 MG tablet TAKE 1 TABLET BY MOUTH EVERY 8 HOURS AS NEEDED FOR MUSCLE SPASM 60 tablet 0  . losartan  (COZAAR ) 50 MG tablet Take 1 tablet (50 mg total) by mouth daily. 90 tablet 3  . potassium chloride  SA (KLOR-CON  M20) 20 MEQ tablet Take 1 tablet (20 mEq total) by mouth daily. 90 tablet 3   No facility-administered medications prior to visit.    Allergies  Allergen Reactions  . Nsaids     GI BLEED       Objective:     BP 134/83 (BP Location: Left Arm, Patient Position: Sitting, Cuff Size: Normal)   Pulse 65   Resp 20   Ht 5' 4 (1.626 m)   Wt 191 lb 6.4 oz (86.8 kg)   LMP 10/24/2021   SpO2 100%   BMI 32.85 kg/m  Wt Readings from Last 3 Encounters:  12/10/23 191 lb 6.4 oz (86.8 kg)  11/18/23 194 lb 12.8 oz (88.4 kg)  09/09/23 197 lb 12.8 oz (89.7 kg)    Physical Exam Vitals and nursing note reviewed.  Constitutional:      Appearance: Normal appearance.  HENT:     Head: Normocephalic.  Cardiovascular:     Rate and Rhythm: Normal rate and regular rhythm.     Heart sounds: Normal heart sounds.  Pulmonary:     Effort: Pulmonary effort is normal.     Breath sounds: Normal breath sounds.  Chest:     Chest wall: No swelling or tenderness.  Musculoskeletal:     Right shoulder: Swelling and tenderness present. Normal range of motion.     Left shoulder: Normal.       Arms:     Cervical back: Full passive range of motion without pain. No pain with movement.     Right upper leg: Tenderness present.     Left upper leg: Normal.       Legs:     Comments: Right mid thigh tenderness with movement  Skin:    General: Skin is warm and dry.  Neurological:     Mental Status: She is alert and oriented to person, place, and time.  Psychiatric:        Attention and Perception: Attention normal.        Mood and Affect: Mood normal.        Speech: Speech normal.        Behavior: Behavior is cooperative.        Thought Content: Thought content normal.        Patient has been counseled extensively about nutrition and exercise as well as the importance of adherence with medications and regular follow-up. The patient was given clear instructions to go to ER or return to medical center if symptoms don't improve, worsen or new  problems develop. The patient verbalized understanding.   Follow-up: Return in about 3 months (around 03/08/2024).   Timoteo Carreiro E Abdo Denault, RN BSN- FNP Student [facility] [place], [place] [phone]   12/10/2023, 4:54 PM

## 2023-12-10 NOTE — Progress Notes (Signed)
 I have seen and examined this patient with the advanced practice provider STUDENT and agree with the above note .

## 2023-12-10 NOTE — Progress Notes (Signed)
 Deep pain around inner leg.

## 2023-12-11 LAB — CBC WITH DIFFERENTIAL/PLATELET
Basophils Absolute: 0 10*3/uL (ref 0.0–0.2)
Basos: 0 %
EOS (ABSOLUTE): 0.1 10*3/uL (ref 0.0–0.4)
Eos: 2 %
Hematocrit: 42.9 % (ref 34.0–46.6)
Hemoglobin: 13.9 g/dL (ref 11.1–15.9)
Immature Grans (Abs): 0 10*3/uL (ref 0.0–0.1)
Immature Granulocytes: 0 %
Lymphocytes Absolute: 1.7 10*3/uL (ref 0.7–3.1)
Lymphs: 30 %
MCH: 29.3 pg (ref 26.6–33.0)
MCHC: 32.4 g/dL (ref 31.5–35.7)
MCV: 91 fL (ref 79–97)
Monocytes Absolute: 0.3 10*3/uL (ref 0.1–0.9)
Monocytes: 5 %
Neutrophils Absolute: 3.6 10*3/uL (ref 1.4–7.0)
Neutrophils: 63 %
Platelets: 81 10*3/uL — CL (ref 150–450)
RBC: 4.74 x10E6/uL (ref 3.77–5.28)
RDW: 12.6 % (ref 11.7–15.4)
WBC: 5.7 10*3/uL (ref 3.4–10.8)

## 2023-12-12 ENCOUNTER — Encounter: Payer: Self-pay | Admitting: Nurse Practitioner

## 2023-12-12 LAB — MICROALBUMIN / CREATININE URINE RATIO
Creatinine, Urine: 83.9 mg/dL
Microalb/Creat Ratio: 12 mg/g{creat} (ref 0–29)
Microalbumin, Urine: 10 ug/mL

## 2023-12-12 NOTE — Telephone Encounter (Signed)
 Noted, patient will contact Dr. Gracie Lav.

## 2023-12-19 ENCOUNTER — Encounter: Payer: Self-pay | Admitting: Neurology

## 2023-12-19 ENCOUNTER — Ambulatory Visit
Admission: RE | Admit: 2023-12-19 | Discharge: 2023-12-19 | Disposition: A | Discharge status: Home / Self Care | Payer: BC Managed Care – PPO | Source: Ambulatory Visit | Attending: Podiatry | Admitting: Podiatry

## 2023-12-19 DIAGNOSIS — M542 Cervicalgia: Secondary | ICD-10-CM

## 2023-12-19 DIAGNOSIS — R202 Paresthesia of skin: Secondary | ICD-10-CM

## 2023-12-19 DIAGNOSIS — M7750 Other enthesopathy of unspecified foot: Secondary | ICD-10-CM

## 2023-12-19 DIAGNOSIS — M7662 Achilles tendinitis, left leg: Secondary | ICD-10-CM | POA: Diagnosis not present

## 2023-12-19 DIAGNOSIS — M25572 Pain in left ankle and joints of left foot: Secondary | ICD-10-CM | POA: Diagnosis not present

## 2023-12-25 ENCOUNTER — Telehealth: Payer: Self-pay

## 2023-12-25 NOTE — Telephone Encounter (Signed)
Call to patient, in agreement for 02/05/24 appointment

## 2023-12-25 NOTE — Telephone Encounter (Signed)
I called patient, she continues to have left arm paresthesia mainly involving left fourth and fifth fingers, no significant neck pain no shoulder pain  Previous MRI of cervical spine and the EMG nerve conduction study of left upper extremity showed no significant abnormality  Please give her a follow-up with me at my next available before further evaluation such as Doppler study of left upper extremity or even MRI of left shoulder to rule out left brachial plexus pathology

## 2023-12-29 ENCOUNTER — Encounter: Payer: Self-pay | Admitting: Podiatry

## 2023-12-31 ENCOUNTER — Other Ambulatory Visit: Payer: Self-pay | Admitting: Podiatry

## 2023-12-31 DIAGNOSIS — M7750 Other enthesopathy of unspecified foot: Secondary | ICD-10-CM

## 2024-01-06 DIAGNOSIS — M542 Cervicalgia: Secondary | ICD-10-CM | POA: Diagnosis not present

## 2024-01-06 DIAGNOSIS — G43719 Chronic migraine without aura, intractable, without status migrainosus: Secondary | ICD-10-CM | POA: Diagnosis not present

## 2024-01-11 ENCOUNTER — Encounter: Payer: Self-pay | Admitting: Podiatry

## 2024-01-12 ENCOUNTER — Other Ambulatory Visit: Payer: Self-pay | Admitting: Nurse Practitioner

## 2024-01-12 ENCOUNTER — Encounter: Payer: Self-pay | Admitting: Nurse Practitioner

## 2024-01-12 ENCOUNTER — Other Ambulatory Visit: Payer: Self-pay

## 2024-01-12 DIAGNOSIS — R232 Flushing: Secondary | ICD-10-CM

## 2024-01-12 DIAGNOSIS — J309 Allergic rhinitis, unspecified: Secondary | ICD-10-CM

## 2024-01-12 MED ORDER — FLUTICASONE PROPIONATE 50 MCG/ACT NA SUSP: 2.0000 | Freq: Every day | NASAL | 6 refills | Status: AC

## 2024-01-12 MED ORDER — FLUTICASONE PROPIONATE 50 MCG/ACT NA SUSP
2.0000 | Freq: Every day | NASAL | 6 refills | Status: DC
  Filled 2024-01-12: qty 16, 30d supply, fill #0

## 2024-01-12 MED ORDER — IPRATROPIUM BROMIDE 0.03 % NA SOLN
2.0000 | Freq: Two times a day (BID) | NASAL | 12 refills | Status: DC
  Filled 2024-01-12: qty 30, 30d supply, fill #0

## 2024-01-12 MED ORDER — PAROXETINE HCL 10 MG PO TABS: 10.0000 mg | ORAL_TABLET | Freq: Every day | ORAL | 0 refills | Status: DC

## 2024-01-13 ENCOUNTER — Encounter: Payer: Self-pay | Admitting: Podiatry

## 2024-01-13 ENCOUNTER — Ambulatory Visit (INDEPENDENT_AMBULATORY_CARE_PROVIDER_SITE_OTHER): Admitting: Podiatry

## 2024-01-13 DIAGNOSIS — L02611 Cutaneous abscess of right foot: Secondary | ICD-10-CM | POA: Diagnosis not present

## 2024-01-13 DIAGNOSIS — L03119 Cellulitis of unspecified part of limb: Secondary | ICD-10-CM | POA: Diagnosis not present

## 2024-01-13 DIAGNOSIS — L02619 Cutaneous abscess of unspecified foot: Secondary | ICD-10-CM | POA: Diagnosis not present

## 2024-01-13 DIAGNOSIS — R52 Pain, unspecified: Secondary | ICD-10-CM | POA: Diagnosis not present

## 2024-01-13 MED ORDER — CEPHALEXIN 500 MG PO CAPS: 500.0000 mg | ORAL_CAPSULE | Freq: Three times a day (TID) | ORAL | 0 refills | Status: DC

## 2024-01-13 NOTE — Patient Instructions (Signed)

## 2024-01-13 NOTE — Progress Notes (Unsigned)
 Subjective: Chief Complaint  Patient presents with   Nail Problem    RM#13 Big toe right foot possible infection after nail removal.   48 year old female presents the office with concerns of infection of the right big toenail, which is new.  She states that her dog did step on her foot and she has some skin buildup that she try to clean out.  She started noticing infections that she needed a needle to try to puncture which was not able to get anything out.  Area is tender.    Objective: AAO x3, NAD DP/PT pulses palpable bilaterally, CRT less than 3 seconds Along the lateral aspect of right hallux toenail is localized area of edema and erythema with some fluid underneath this.  See procedure note below but there was purulence identified.  There is no ascending cellulitis.  There is no crepitation. No pain with calf compression, swelling, warmth, erythema  Assessment: Abscess right hallux  Plan: -All treatment options discussed with the patient including all alternatives, risks, complications.  -Risk for incision and drainage of the right hallux.  Discussed risks and she agrees and consent was signed.  I identified the toe with 3 mL lidocaine, Marcaine plain with additional 2 mL of lidocaine to completely anesthetize the toe.  I put skin with Betadine.  Tourniquet applied.  I then utilized a Freer in order to puncture the area of the purulent drainage.  This was cultured.  I then debrided any nonviable, infected tissue.  Debrided to healthy, bleeding tissue.  No further purulence.  I irrigated with alcohol.  Silvadene applied followed by dressing.  Tourniquet released and there is found to be an immediate capillary fill time to the toe. -Keflex -Discussed if there is any residual nail we might need to do repeat partial nail avulsion with chemical matrixectomy once infection clears. -Monitor for any clinical signs or symptoms of infection and directed to call the office immediately should any  occur or go to the ER. -Patient encouraged to call the office with any questions, concerns, change in symptoms.   Return in about 2 weeks (around 01/27/2024) for possible partial nail removal, follow up from infection.  Vivi Barrack DPM

## 2024-01-14 ENCOUNTER — Ambulatory Visit

## 2024-01-14 NOTE — Therapy (Deleted)
 OUTPATIENT PHYSICAL THERAPY LOWER EXTREMITY EVALUATION   Patient Name: Angela Castillo MRN: [id number] DOB:06/10/1976, 48 y.o., female Today's Date: 01/14/2024  END OF SESSION:   Past Medical History:  Diagnosis Date   Anemia Dx January 03 2015   Diabetes mellitus without complication (HCC)    Elevated liver enzymes    Essential hypertension Dx Apr 2016   Fatty liver    H/O: upper GI bleed 03/2019    variceal bleed    Headache    History of blood transfusion 03/2019   Liver cirrhosis secondary to NASH (HCC)    Neuropathy    Pneumonia 03/2019   Reflux    Thrombocytopenia (HCC)    Past Surgical History:  Procedure Laterality Date   DILITATION & CURRETTAGE/HYSTROSCOPY WITH HYDROTHERMAL ABLATION N/A 11/20/2021   Procedure: DILATATION & CURETTAGE/HYSTEROSCOPY WITH HYDROTHERMAL ABLATION;  Surgeon: Reva Bores, MD;  Location: [facility] OR;  Service: Gynecology;  Laterality: N/A;  rep will be here confirmed on 11/12/21 CS   ESOPHAGEAL BANDING  03/22/2019   Procedure: ESOPHAGEAL BANDING;  Surgeon: Charlott Rakes, MD;  Location: [facility];  Service: Endoscopy;;   ESOPHAGEAL BANDING  03/23/2019   Procedure: ESOPHAGEAL BANDING;  Surgeon: Charlott Rakes, MD;  Location: [facility];  Service: Endoscopy;;   ESOPHAGEAL BANDING  09/20/2019   Procedure: ESOPHAGEAL BANDING;  Surgeon: Charlott Rakes, MD;  Location: [facility];  Service: Endoscopy;;   ESOPHAGEAL BANDING N/A 12/21/2019   Procedure: ESOPHAGEAL BANDING;  Surgeon: Charlott Rakes, MD;  Location: [facility];  Service: Endoscopy;  Laterality: N/A;   ESOPHAGOGASTRODUODENOSCOPY N/A 03/22/2019   Procedure: ESOPHAGOGASTRODUODENOSCOPY (EGD);  Surgeon: Charlott Rakes, MD;  Location: [facility];  Service: Endoscopy;  Laterality: N/A;   ESOPHAGOGASTRODUODENOSCOPY N/A 03/23/2019   Procedure: ESOPHAGOGASTRODUODENOSCOPY (EGD);  Surgeon: Charlott Rakes, MD;  Location: [facility];  Service: Endoscopy;  Laterality:  N/A;   ESOPHAGOGASTRODUODENOSCOPY (EGD) WITH PROPOFOL N/A 07/23/2019   Procedure: ESOPHAGOGASTRODUODENOSCOPY (EGD) WITH PROPOFOL with possible banding;  Surgeon: Charlott Rakes, MD;  Location: [facility];  Service: Endoscopy;  Laterality: N/A;   ESOPHAGOGASTRODUODENOSCOPY (EGD) WITH PROPOFOL N/A 09/20/2019   Procedure: ESOPHAGOGASTRODUODENOSCOPY (EGD) WITH PROPOFOL;  Surgeon: Charlott Rakes, MD;  Location: [facility];  Service: Endoscopy;  Laterality: N/A;   ESOPHAGOGASTRODUODENOSCOPY (EGD) WITH PROPOFOL N/A 12/21/2019   Procedure: ESOPHAGOGASTRODUODENOSCOPY (EGD) WITH PROPOFOL with possible banding;  Surgeon: Charlott Rakes, MD;  Location: [facility];  Service: Endoscopy;  Laterality: N/A;   HERNIA REPAIR     TUBAL LIGATION  07/25/2000   VENTRAL HERNIA REPAIR N/A 07/06/2020   Procedure: VENTRAL HERNIA REPAIR WITH MESH;  Surgeon: Abigail Miyamoto, MD;  Location: [facility] OR;  Service: General;  Laterality: N/A;   Patient Active Problem List   Diagnosis Date Noted   Paresthesia 02/18/2023   Neck pain 02/18/2023   Abnormal uterine bleeding 11/20/2021   Menorrhagia 11/01/2021   Ingrown toenail 10/16/2019   Cirrhosis (HCC) 07/23/2019   Diabetes mellitus (HCC) 04/20/2019   Acute respiratory insufficiency    H/O: upper GI bleed 03/21/2019   Impingement syndrome of left shoulder 04/21/2017   Low back pain 03/18/2016   Left shoulder pain 03/18/2016   Esophageal reflux 03/18/2016   Lateral pain of left hip 10/25/2015   Heel pain, bilateral 05/03/2015   Obesity (BMI 30-39.9) 05/03/2015   Hearing loss in right ear 03/01/2015   Family history of diabetes mellitus in mother 03/01/2015   Essential hypertension 01/18/2015   Anemia, iron deficiency 01/04/2015   Musculoskeletal chest pain 01/04/2015   Thrombocytopenia (HCC) 01/04/2015  Transaminitis 01/03/2015    PCP: Claiborne Rigg, NP   REFERRING PROVIDER: Vivi Barrack, DPM  REFERRING DIAG: M77.50 (ICD-10-CM) -  Tendonitis of ankle or foot  THERAPY DIAG:  No diagnosis found.  Rationale for Evaluation and Treatment: Rehabilitation  ONSET DATE: ***  SUBJECTIVE:   SUBJECTIVE STATEMENT: 48 year old female presents the office today for follow-up evaluation of left foot pain.  She said the injection was helpful but started to become tender again over the last day or so.  No recent injuries.  Today she points more on the Achilles tendon where she gets discomfort.  No recent injury or changes otherwise since also her last.  No new concerns.   PERTINENT HISTORY: *** PAIN:  Are you having pain? Yes: NPRS scale: *** Pain location: *** Pain description: *** Aggravating factors: *** Relieving factors: ***  PRECAUTIONS: None  RED FLAGS: None   WEIGHT BEARING RESTRICTIONS: No  FALLS:  Has patient fallen in last 6 months? No  OCCUPATION: ***  PLOF: {PLOF:24004}  PATIENT GOALS: ***  NEXT MD VISIT: ***  OBJECTIVE:  Note: Objective measures were completed at Evaluation unless otherwise noted.  DIAGNOSTIC FINDINGS: Peroneal: Peroneal longus tendon intact. Peroneal brevis intact.   Posteromedial: Posterior tibial tendon intact. Flexor hallucis longus tendon intact. Flexor digitorum longus tendon intact.   Anterior: Tibialis anterior tendon intact. Extensor hallucis longus tendon intact Extensor digitorum longus tendon intact.   Achilles:  Mild distal Achilles tendinosis.   Plantar Fascia: Intact.   LIGAMENTS   Lateral: Anterior talofibular ligament intact. Calcaneofibular ligament intact. Posterior talofibular ligament intact. Anterior and posterior tibiofibular ligaments intact.   Medial: Deltoid ligament intact. Spring ligament intact.   CARTILAGE   Ankle Joint: No joint effusion. Normal ankle mortise. No chondral defect.   Subtalar Joints/Sinus Tarsi: Normal subtalar joints. No subtalar joint effusion. Normal sinus tarsi.   Bones: No aggressive osseous lesion. No  fracture or dislocation. Enthesopathic changes of the hindfoot.   Soft Tissue: No fluid collection or hematoma. Muscles are normal without edema or atrophy. Tarsal tunnel is normal.   IMPRESSION: 1. Mild distal Achilles tendinosis.     Electronically Signed   By: Elige Ko M.D.   On: 12/29/2023 14:42  PATIENT SURVEYS:  LEFS ***  MUSCLE LENGTH: Hamstrings: Right *** deg; Left *** deg Maisie Fus test: Right *** deg; Left *** deg  POSTURE: {posture:25561}  PALPATION: ***  LOWER EXTREMITY ROM:  {AROM/PROM:27142} ROM Right eval Left eval  Hip flexion    Hip extension    Hip abduction    Hip adduction    Hip internal rotation    Hip external rotation    Knee flexion    Knee extension    Ankle dorsiflexion    Ankle plantarflexion    Ankle inversion    Ankle eversion     (Blank rows = not tested)  LOWER EXTREMITY MMT:  MMT Right eval Left eval  Hip flexion    Hip extension    Hip abduction    Hip adduction    Hip internal rotation    Hip external rotation    Knee flexion    Knee extension    Ankle dorsiflexion    Ankle plantarflexion    Ankle inversion    Ankle eversion     (Blank rows = not tested)  LOWER EXTREMITY SPECIAL TESTS:  Ankle special tests: {ANKLE SPECIAL TESTS:26241}  FUNCTIONAL TESTS:  30 seconds chair stand test  GAIT: Distance walked: 85ftx2 Assistive device utilized: {Assistive  devices:23999} Level of assistance: {Levels of assistance:24026} Comments: ***                                                                                                                                TREATMENT DATE:    PATIENT EDUCATION:  Education details: Discussed eval findings, rehab rationale and POC and patient is in agreement  Person educated: Patient Education method: Explanation Education comprehension: verbalized understanding and needs further education  HOME EXERCISE PROGRAM: ***  ASSESSMENT:  CLINICAL IMPRESSION: Patient is  a *** y.o. *** who was seen today for physical therapy evaluation and treatment for ***.   OBJECTIVE IMPAIRMENTS: {opptimpairments:25111}.   ACTIVITY LIMITATIONS: {activitylimitations:27494}  PARTICIPATION LIMITATIONS: {participationrestrictions:25113}  PERSONAL FACTORS: {Personal factors:25162} are also affecting patient's functional outcome.   REHAB POTENTIAL: {rehabpotential:25112}  CLINICAL DECISION MAKING: {clinical decision making:25114}  EVALUATION COMPLEXITY: {Evaluation complexity:25115}   GOALS: Goals reviewed with patient? No  SHORT TERM GOALS: Target date: 02/04/2024   Patient to demonstrate independence in HEP  Baseline: Goal status: INITIAL  2.  *** Baseline:  Goal status: INITIAL  3.  *** Baseline:  Goal status: INITIAL  4.  *** Baseline:  Goal status: INITIAL  5.  *** Baseline:  Goal status: INITIAL  6.  *** Baseline:  Goal status: INITIAL  LONG TERM GOALS: Target date: 02/25/2024    Patient will acknowledge ***/10 pain at least once during episode of care   Baseline:  Goal status: INITIAL  2.  Patient will increase 30s chair stand reps from *** to *** with/without arms to demonstrate and improved functional ability with less pain/difficulty as well as reduce fall risk.  Baseline:  Goal status: INITIAL  3.  Patient will score at least ***% on FOTO to signify clinically meaningful improvement in functional abilities.   Baseline:  Goal status: INITIAL  4.  *** Baseline:  Goal status: INITIAL  5.  *** Baseline:  Goal status: INITIAL  6.  *** Baseline:  Goal status: INITIAL   PLAN:  PT FREQUENCY: 2x/week  PT DURATION: 6 weeks  PLANNED INTERVENTIONS: 97164- PT Re-evaluation, 97110-Therapeutic exercises, 97530- Therapeutic activity, 97112- Neuromuscular re-education, 97535- Self Care, 09811- Manual therapy, [phone]- Gait training, Patient/Family education, Balance training, Stair training, Dry Needling, and Joint  mobilization  PLAN FOR NEXT SESSION: HEP review and update, manual techniques as appropriate, aerobic tasks, ROM and flexibility activities, strengthening and PREs, TPDN, gait and balance training as needed     Hildred Laser, PT 01/14/2024, 7:47 AM

## 2024-01-15 ENCOUNTER — Other Ambulatory Visit: Payer: Self-pay

## 2024-01-16 ENCOUNTER — Encounter: Payer: Self-pay | Admitting: Podiatry

## 2024-01-16 ENCOUNTER — Other Ambulatory Visit: Payer: Self-pay

## 2024-01-16 LAB — WOUND CULTURE
MICRO NUMBER:: 16187287
SPECIMEN QUALITY:: ADEQUATE

## 2024-01-19 ENCOUNTER — Other Ambulatory Visit: Payer: Self-pay

## 2024-01-20 ENCOUNTER — Encounter: Payer: Self-pay | Admitting: Nurse Practitioner

## 2024-01-21 ENCOUNTER — Other Ambulatory Visit: Payer: Self-pay

## 2024-01-22 DIAGNOSIS — M549 Dorsalgia, unspecified: Secondary | ICD-10-CM | POA: Diagnosis not present

## 2024-01-22 DIAGNOSIS — M7662 Achilles tendinitis, left leg: Secondary | ICD-10-CM | POA: Diagnosis not present

## 2024-01-24 ENCOUNTER — Encounter (HOSPITAL_BASED_OUTPATIENT_CLINIC_OR_DEPARTMENT_OTHER): Payer: Self-pay | Admitting: Urology

## 2024-01-24 ENCOUNTER — Emergency Department (HOSPITAL_BASED_OUTPATIENT_CLINIC_OR_DEPARTMENT_OTHER)
Admission: EM | Admit: 2024-01-24 | Discharge: 2024-01-24 | Disposition: A | Discharge status: Home / Self Care | Attending: Emergency Medicine | Admitting: Emergency Medicine

## 2024-01-24 DIAGNOSIS — T148XXA Other injury of unspecified body region, initial encounter: Secondary | ICD-10-CM

## 2024-01-24 DIAGNOSIS — Z79899 Other long term (current) drug therapy: Secondary | ICD-10-CM | POA: Diagnosis not present

## 2024-01-24 DIAGNOSIS — S39012A Strain of muscle, fascia and tendon of lower back, initial encounter: Secondary | ICD-10-CM | POA: Diagnosis not present

## 2024-01-24 DIAGNOSIS — Z7984 Long term (current) use of oral hypoglycemic drugs: Secondary | ICD-10-CM | POA: Diagnosis not present

## 2024-01-24 DIAGNOSIS — R109 Unspecified abdominal pain: Secondary | ICD-10-CM | POA: Diagnosis not present

## 2024-01-24 DIAGNOSIS — X58XXXA Exposure to other specified factors, initial encounter: Secondary | ICD-10-CM | POA: Diagnosis not present

## 2024-01-24 DIAGNOSIS — E119 Type 2 diabetes mellitus without complications: Secondary | ICD-10-CM | POA: Diagnosis not present

## 2024-01-24 DIAGNOSIS — S39011A Strain of muscle, fascia and tendon of abdomen, initial encounter: Secondary | ICD-10-CM | POA: Diagnosis not present

## 2024-01-24 LAB — URINALYSIS, ROUTINE W REFLEX MICROSCOPIC
Bilirubin Urine: NEGATIVE
Glucose, UA: >=500 mg/dL — AB
Hgb urine dipstick: NEGATIVE
Ketones, ur: NEGATIVE mg/dL
Leukocytes,Ua: NEGATIVE
Nitrite: NEGATIVE
Protein, ur: NEGATIVE mg/dL
Specific Gravity, Urine: 1.025 (ref 1.005–1.030)
pH: 6.5 (ref 5.0–8.0)

## 2024-01-24 LAB — URINALYSIS, MICROSCOPIC (REFLEX): WBC, UA: NONE SEEN WBC/hpf (ref 0–5)

## 2024-01-24 MED ORDER — LIDOCAINE 5 % EX PTCH
1.0000 | MEDICATED_PATCH | CUTANEOUS | Status: DC
  Administered 2024-01-24: 1 via TRANSDERMAL
  Filled 2024-01-24: qty 1

## 2024-01-24 MED ORDER — KETOROLAC TROMETHAMINE 15 MG/ML IJ SOLN
15.0000 mg | Freq: Once | INTRAMUSCULAR | Status: AC
  Administered 2024-01-24: 15 mg via INTRAMUSCULAR
  Filled 2024-01-24: qty 1

## 2024-01-24 NOTE — ED Triage Notes (Signed)
 Pt states left side flank pain since yesterday No urinary concerns noted  States pain is constant   Denies any known injury

## 2024-01-24 NOTE — Discharge Instructions (Signed)
 You have a muscle strain, please take Tylenol for your pain control, and use of muscle relaxers, as previously provided for pain control.  You can also use over-the-counter lidocaine patches to help with the pain.  Your urine was clear

## 2024-01-24 NOTE — ED Provider Notes (Signed)
 [place] [facility] AT [facility] Provider Note   CSN: [id number] Arrival date & time: 01/24/24  1119     History  Chief Complaint  Patient presents with   Flank Pain    Angela Castillo is a 48 y.o. female, cirrhosis, type 2 diabetes, who presents to the ED secondary to left flank pain, has been going on for about a day.  She states that she noticed it yesterday, and it radiates from the left side of the back, to the left upper quadrant.  She has no nausea, vomiting, urinary symptoms associated with it.  States the pain is kind of achy, and has intermittent twinges of severe pain.  Worse with truncal rotation.  Denies any other new rashes     Home Medications Prior to Admission medications   Medication Sig Start Date End Date Taking? Authorizing Provider  Accu-Chek Softclix Lancets lancets USE TO CHECK BLOOD SUGAR THREE TIMES DAILY 12/04/22   Hoy Register, MD  acetaminophen (TYLENOL) 500 MG tablet Take 500-1,000 mg by mouth every 6 (six) hours as needed for mild pain.    [provider]  albuterol (VENTOLIN HFA) 108 (90 Base) MCG/ACT inhaler Inhale 2 puffs into the lungs every 6 (six) hours as needed for wheezing or shortness of breath. 09/07/23   Claiborne Rigg, NP  Biotin 40981 MCG TABS Take 10,000 mcg by mouth daily.    [provider]  Blood Glucose Monitoring Suppl (ACCU-CHEK GUIDE) w/Device KIT Use to check blood sugar three times daily. 05/01/22   Hoy Register, MD  cephALEXin (KEFLEX) 500 MG capsule Take 1 capsule (500 mg total) by mouth 3 (three) times daily. 01/13/24   Vivi Barrack, DPM  diclofenac Sodium (VOLTAREN) 1 % GEL Apply 4 g topically 4 (four) times daily. 12/10/23   Claiborne Rigg, NP  esomeprazole (NEXIUM) 40 MG capsule Take 40 mg by mouth 2 (two) times daily before a meal. 03/05/23   [provider]  FARXIGA 5 MG TABS tablet TAKE 1 TABLET BY MOUTH EVERY DAY BEFORE BREAKFAST 07/23/23   Claiborne Rigg, NP  ferrous sulfate 325 (65 FE) MG tablet Take 1 tablet (325 mg total) by mouth 2 (two) times daily with a meal. 09/09/23   Claiborne Rigg, NP  fluticasone (FLONASE) 50 MCG/ACT nasal spray Place 2 sprays into both nostrils daily. 01/12/24   Claiborne Rigg, NP  gabapentin (NEURONTIN) 300 MG capsule Take 1 capsule (300 mg total) by mouth 3 (three) times daily. For hot flashes 09/23/23   Claiborne Rigg, NP  glucose blood (ACCU-CHEK GUIDE) test strip USE TO CHECK BLOOD SUGAR THREE TIMES DAILY 12/04/22   Hoy Register, MD  ketoconazole (NIZORAL) 2 % cream Apply 1 application  topically daily as needed for irritation (as needed).    [provider]  loratadine (CLARITIN) 10 MG tablet TAKE 1 TABLET BY MOUTH EVERY DAY 05/28/22   Claiborne Rigg, NP  losartan (COZAAR) 50 MG tablet Take 1 tablet (50 mg total) by mouth daily. 07/25/23 10/23/23  Little Ishikawa, MD  Menthol, Topical Analgesic, (ICY HOT MEDICATED SPRAY) 16 % LIQD Apply 1 spray topically daily as needed (pain).    [provider]  metFORMIN (GLUCOPHAGE) 500 MG tablet Take 1 tablet (500 mg total) by mouth 2 (two) times daily with a meal. 09/09/23   Claiborne Rigg, NP  methocarbamol (ROBAXIN) 500 MG tablet Take 1 tablet (500 mg total) by mouth every 8 (eight)  hours as needed for muscle spasms. 12/10/23   Claiborne Rigg, NP  Microlet Lancets MISC USE UP TO FOUR TIMES DAILY AS DIRECTED 04/11/21   Claiborne Rigg, NP  mupirocin ointment (BACTROBAN) 2 % Apply topically 2 (two) times daily. 04/28/23   Hoy Register, MD  ondansetron (ZOFRAN-ODT) 4 MG disintegrating tablet DISSOLVE 1 TABLET(4 MG) ON THE TONGUE EVERY 8 HOURS AS NEEDED FOR VOMITING OR NAUSEA 10/06/23   Newlin, Enobong, MD  OZEMPIC, 0.25 OR 0.5 MG/DOSE, 2 MG/3ML SOPN INJECT 0.5MG  INTO THE SKIN ONCE A WEEK 08/22/23   Hoy Register, MD  PARoxetine (PAXIL) 10 MG tablet Take 1 tablet (10 mg total) by mouth daily. HOT FLASHES 01/12/24   Claiborne Rigg, NP   potassium chloride SA (KLOR-CON M20) 20 MEQ tablet Take 1 tablet (20 mEq total) by mouth daily. 07/16/23 10/14/23  Little Ishikawa, MD  propranolol (INDERAL) 20 MG tablet TAKE 1 TABLET BY MOUTH TWICE A DAY 05/12/23   Claiborne Rigg, NP  rosuvastatin (CRESTOR) 10 MG tablet TAKE 1 TABLET(10 MG) BY MOUTH DAILY 12/04/23   Claiborne Rigg, NP  VASCEPA 1 g capsule TAKE 1 CAPSULE(1 GRAM) BY MOUTH TWICE DAILY 11/07/23   Hoy Register, MD  zonisamide (ZONEGRAN) 25 MG capsule Take 100 mg by mouth daily. 05/07/23   [provider]      Allergies    Nsaids    Review of Systems   Review of Systems  Constitutional:  Negative for fever.  Genitourinary:  Positive for flank pain.    Physical Exam Updated Vital Signs BP 125/80   Pulse 66   Temp 98.7 F (37.1 C) (Oral)   Resp 18   Ht 5\' 4"  (1.626 m)   Wt 86.8 kg   LMP 10/24/2021   SpO2 99%   BMI 32.85 kg/m  Physical Exam Vitals and nursing note reviewed.  Constitutional:      General: She is not in acute distress.    Appearance: She is well-developed.  HENT:     Head: Normocephalic and atraumatic.  Eyes:     Conjunctiva/sclera: Conjunctivae normal.  Cardiovascular:     Rate and Rhythm: Normal rate and regular rhythm.     Heart sounds: No murmur heard. Pulmonary:     Effort: Pulmonary effort is normal. No respiratory distress.     Breath sounds: Normal breath sounds.  Abdominal:     Palpations: Abdomen is soft.     Tenderness: There is no abdominal tenderness.  Musculoskeletal:        General: No swelling.     Cervical back: Neck supple.     Comments: Tenderness to palpation, of left flank, extending from left back, to lateral aspect of left upper quadrant.  Skin:    General: Skin is warm and dry.     Capillary Refill: Capillary refill takes less than 2 seconds.     Comments: No evidence of rash, in the area of pain  Neurological:     Mental Status: She is alert.  Psychiatric:        Mood and Affect: Mood normal.      ED Results / Procedures / Treatments   Labs (all labs ordered are listed, but only abnormal results are displayed) Labs Reviewed  URINALYSIS, ROUTINE W REFLEX MICROSCOPIC - Abnormal; Notable for the following components:      Result Value   Glucose, UA >=500 (*)    All other components within normal limits  URINALYSIS, MICROSCOPIC (REFLEX) -  Abnormal; Notable for the following components:   Bacteria, UA FEW (*)    All other components within normal limits    EKG None  Radiology No results found.  Procedures Procedures    Medications Ordered in ED Medications  lidocaine (LIDODERM) 5 % 1 patch (1 patch Transdermal Patch Applied 01/24/24 1201)  ketorolac (TORADOL) 15 MG/ML injection 15 mg (15 mg Intramuscular Given 01/24/24 1202)    ED Course/ Medical Decision Making/ A&P                                 Medical Decision Making Patient is a 48 year old female, here for left back pain, radiating to the left flank, this been going on for the last day or 2.  It is achy with sharp spasms occasionally.  She has no urinary complaints.  No chest pain, or shortness of breath.  She is overall well-appearing on exam she has tenderness to palpation, of the left back, rating to the left flank.  This aligns in a abdominal muscle wall region.  I will give her some Toradol, and lidocaine patch, to see if there is any relief of her symptoms.  She has not had any nausea, vomiting, or abdominal pain which is overall reassuring, abdominal exam is benign.  Will check a urine, to make sure no urinary tract infection.  Suspicious for likely musculoskeletal origin as the pain is worse on truncal rotation, and on palpation  Amount and/or Complexity of Data Reviewed Labs: ordered.    Details: Urinalysis unremarkable Discussion of management or test interpretation with external provider(s): Patient is a 48 year old female, here for flank pain, urinalysis is unremarkable, she had symptomatic  improvement with the Toradol, the lidocaine, I am suspicious for possible muscle spasm, or strain, which is causing this pain.  I discussed taking Tylenol, lidocaine patch, and she has Robaxin at home, she reports she will follow-up with her primary care doctor as needed.  Risk Prescription drug management.    Final Clinical Impression(s) / ED Diagnoses Final diagnoses:  Left flank pain  Muscle strain    Rx / DC Orders ED Discharge Orders     None         Delesia Martinek, Harley Alto, PA 01/24/24 1430    Virgina Norfolk, DO 01/24/24 1447

## 2024-01-27 ENCOUNTER — Ambulatory Visit (INDEPENDENT_AMBULATORY_CARE_PROVIDER_SITE_OTHER): Admitting: Podiatry

## 2024-01-27 DIAGNOSIS — L600 Ingrowing nail: Secondary | ICD-10-CM

## 2024-01-27 NOTE — Patient Instructions (Signed)

## 2024-01-28 DIAGNOSIS — M7662 Achilles tendinitis, left leg: Secondary | ICD-10-CM | POA: Diagnosis not present

## 2024-01-28 DIAGNOSIS — M549 Dorsalgia, unspecified: Secondary | ICD-10-CM | POA: Diagnosis not present

## 2024-01-29 DIAGNOSIS — M549 Dorsalgia, unspecified: Secondary | ICD-10-CM | POA: Diagnosis not present

## 2024-01-29 DIAGNOSIS — M7662 Achilles tendinitis, left leg: Secondary | ICD-10-CM | POA: Diagnosis not present

## 2024-01-29 NOTE — Progress Notes (Signed)
 Subjective: Chief Complaint  Patient presents with   Nail Problem    RM#13 Follow up right big toe nail infection patient states feels like there is something on nail. Still soaking on occasion.   48 year old female presents the office today with above concerns.  She states from infection standpoint she is doing well but she feels exam is thick inside of her toe.We discussed with her removal of the rest of the toenail that is grown back and she wants to proceed with that today.  Objective: AAO x3, NAD DP/PT pulses palpable bilaterally, CRT less than 3 seconds For the medial aspect of the hallux toenail on the right side there is reoccurrence of the toenail.  This does cause discomfort.  The area with infection that previously there is currently no drainage or pus or any fluctuation, crepitation, malodor.  There is no open lesions. No pain with calf compression, swelling, warmth, erythema  Assessment: 48 year old female with recurrent ingrown toenail; resolved infection, abscess  Plan: -All treatment options discussed with the patient including all alternatives, risks, complications.  -At this time, the patient is requesting partial nail removal with chemical matricectomy to the symptomatic portion of the nail. Risks and complications were discussed with the patient for which they understand and written consent was obtained. Under sterile conditions a total of 3 mL of a mixture of 2% lidocaine plain and 0.5% Marcaine plain was infiltrated in a hallux block fashion. Once anesthetized, the skin was prepped in sterile fashion. A tourniquet was then applied. Next the medial aspect of hallux nail border was then sharply excised making sure to remove the entire offending nail border. Once the nails were ensured to be removed area was debrided and the underlying skin was intact. There is no purulence identified in the procedure. Next phenol was then applied under standard conditions and copiously  irrigated.  Silvadene was applied. A dry sterile dressing was applied. After application of the dressing the tourniquet was removed and there is found to be an immediate capillary refill time to the digit. The patient tolerated the procedure well any complications. Post procedure instructions were discussed the patient for which he verbally understood. Discussed signs/symptoms of infection and directed to call the office immediately should any occur or go directly to the emergency room. In the meantime, encouraged to call the office with any questions, concerns, changes symptoms. -Patient encouraged to call the office with any questions, concerns, change in symptoms.   Vivi Barrack DPM

## 2024-02-01 ENCOUNTER — Encounter: Payer: Self-pay | Admitting: Podiatry

## 2024-02-03 ENCOUNTER — Other Ambulatory Visit: Payer: Self-pay | Admitting: Family Medicine

## 2024-02-05 ENCOUNTER — Encounter: Payer: Self-pay | Admitting: Neurology

## 2024-02-05 ENCOUNTER — Ambulatory Visit: Payer: BC Managed Care – PPO | Admitting: Neurology

## 2024-02-05 VITALS — BP 132/86 | HR 78 | Ht 64.5 in | Wt 188.5 lb

## 2024-02-05 DIAGNOSIS — M25512 Pain in left shoulder: Secondary | ICD-10-CM | POA: Diagnosis not present

## 2024-02-05 DIAGNOSIS — G5602 Carpal tunnel syndrome, left upper limb: Secondary | ICD-10-CM | POA: Insufficient documentation

## 2024-02-05 DIAGNOSIS — G8929 Other chronic pain: Secondary | ICD-10-CM

## 2024-02-05 NOTE — Progress Notes (Signed)
 Chief Complaint  Patient presents with   Follow-up    Rm13, alone, Paresthesia:left arm daily worse at times with tingly and numb,  Neck pain: pt denied issues w/neck       ASSESSMENT AND PLAN  Angela Castillo is a 48 y.o. female   Left shoulder pain, left upper extremity paresthesia  MRI in April 2024 showed incidental C6 hemangioma, no evidence of significant canal foraminal stenosis,  EMG nerve conduction study in July 2024 showed mild bilateral carpal tunnel syndromes,  New onset left shoulder pain radiating discomfort along left arm, significant tenderness of left shoulder, essentially normal neurological examination otherwise, most consistent with left shoulder pathology, musculoskeletal etiology, suggested as needed NSAIDs warm compression, stretching exercise, diclofenac gel as needed,  May continue follow-up with her primary care if needed may consider orthopedic refer  DIAGNOSTIC DATA (LABS, IMAGING, TESTING) - I reviewed patient records, labs, notes, testing and imaging myself where available.   MEDICAL HISTORY:  Angela Castillo, is a 48 year old female seen in request by her primary care nurse practitioner Bertram Denver for for evaluation of bilateral upper and lower extremity paresthesia, initial evaluation was on February 18, 2023,  I reviewed and summarized the referring note.PMHX. DM since Mya 2020 Chronic migraine. HTN HLD Liver cirrhoses due to NASH She was diagnosed with liver cirrhosis due to alcohol steatotic hepatic disease around May 2020, after GI bleeding,  Around 2023, she began to notice intermittent numbness tingling involving both feet and hands, as if circulation was cut off, denies pain  She denies neck pain, no radiating pain along her spine, no bowel or bladder incontinence  UPDATE May 21 2023: She return for electrodiagnostic study today, which only showed slight bilateral carpal tunnel syndromes.  She continues to have  intermittent neck pain, paresthesia involving left arm more than right arm,  Personally reviewed MRI of cervical spine, mild degenerative changes, no significant canal or foraminal stenosis, incidental benign hemangioma within the C6 vertebral body  Lab in February 2024 showed decreased platelets 66, otherwise normal CBC, CMP  UPDATE February 05 2024: She comes in 6 months history of left shoulder pain, radiating discomfort along left arm, denies subjective weakness,  Reviewed MRI of cervical spine from 2024, only mild degenerative changes no significant canal foraminal stenosis  EMG nerve conduction study in July 2024 also showed mild bilateral carpal tunnel syndromes   PHYSICAL EXAM:   Vitals:   02/05/24 1417  BP: 132/86  Pulse: 78  Height: 5' 4.5" (1.638 m)  Weight: 188 lb 8 oz (85.5 kg)  BMI (Calculated): 31.87    PHYSICAL EXAMNIATION:  Gen: NAD, conversant, well nourised, well groomed                     Cardiovascular: Regular rate rhythm, no peripheral edema, warm, nontender. Eyes: Conjunctivae clear without exudates or hemorrhage Neck: Supple, no carotid bruits. Pulmonary: Clear to auscultation bilaterally   NEUROLOGICAL EXAM:  MENTAL STATUS: Speech/cognition: Awake, alert, oriented to history taking and casual conversation CRANIAL NERVES: CN II: Visual fields are full to confrontation. Pupils are round equal and briskly reactive to light. CN III, IV, VI: extraocular movement are normal. No ptosis. CN V: Facial sensation is intact to light touch CN VII: Face is symmetric with normal eye closure  CN VIII: Hearing is normal to causal conversation. CN IX, X: Phonation is normal. CN XI: Head turning and shoulder shrug are intact  MOTOR: There is no pronator drift of out-stretched  arms. Muscle bulk and tone are normal. Muscle strength is normal.  Left shoulder tenderness upon deep palpitation  REFLEXES: Reflexes are 3 and symmetric at the biceps, triceps, knees, and  ankles. Plantar responses are flexor.  SENSORY: Intact to light touch, pinprick and vibratory sensation are intact in fingers and toes.  COORDINATION: There is no trunk or limb dysmetria noted.  GAIT/STANCE: Posture is normal. Gait is steady   REVIEW OF SYSTEMS:  Full 14 system review of systems performed and notable only for as above All other review of systems were negative.   ALLERGIES: Allergies  Allergen Reactions   Nsaids     GI BLEED    HOME MEDICATIONS: Current Outpatient Medications  Medication Sig Dispense Refill   Accu-Chek Softclix Lancets lancets USE TO CHECK BLOOD SUGAR THREE TIMES DAILY 300 each 0   acetaminophen (TYLENOL) 500 MG tablet Take 500-1,000 mg by mouth every 6 (six) hours as needed for mild pain.     albuterol (VENTOLIN HFA) 108 (90 Base) MCG/ACT inhaler Inhale 2 puffs into the lungs every 6 (six) hours as needed for wheezing or shortness of breath. 6.7 g 1   Biotin 16109 MCG TABS Take 10,000 mcg by mouth daily.     Blood Glucose Monitoring Suppl (ACCU-CHEK GUIDE) w/Device KIT Use to check blood sugar three times daily. 1 kit 0   diclofenac Sodium (VOLTAREN) 1 % GEL Apply 4 g topically 4 (four) times daily. 200 g 1   esomeprazole (NEXIUM) 40 MG capsule Take 40 mg by mouth 2 (two) times daily before a meal.     FARXIGA 5 MG TABS tablet TAKE 1 TABLET BY MOUTH EVERY DAY BEFORE BREAKFAST 90 tablet 1   ferrous sulfate 325 (65 FE) MG tablet Take 1 tablet (325 mg total) by mouth 2 (two) times daily with a meal. 180 tablet 2   fluticasone (FLONASE) 50 MCG/ACT nasal spray Place 2 sprays into both nostrils daily. 16 g 6   glucose blood (ACCU-CHEK GUIDE) test strip USE TO CHECK BLOOD SUGAR THREE TIMES DAILY 300 strip 0   ketoconazole (NIZORAL) 2 % cream Apply 1 application  topically daily as needed for irritation (as needed).     loratadine (CLARITIN) 10 MG tablet TAKE 1 TABLET BY MOUTH EVERY DAY 90 tablet 1   losartan (COZAAR) 50 MG tablet Take 1 tablet (50 mg  total) by mouth daily. 90 tablet 3   Menthol, Topical Analgesic, (ICY HOT MEDICATED SPRAY) 16 % LIQD Apply 1 spray topically daily as needed (pain).     metFORMIN (GLUCOPHAGE) 500 MG tablet Take 1 tablet (500 mg total) by mouth 2 (two) times daily with a meal. 180 tablet 1   methocarbamol (ROBAXIN) 500 MG tablet Take 1 tablet (500 mg total) by mouth every 8 (eight) hours as needed for muscle spasms. 60 tablet 3   Microlet Lancets MISC USE UP TO FOUR TIMES DAILY AS DIRECTED 100 each 11   mupirocin ointment (BACTROBAN) 2 % Apply topically 2 (two) times daily. 22 g 0   ondansetron (ZOFRAN-ODT) 4 MG disintegrating tablet DISSOLVE 1 TABLET(4 MG) ON THE TONGUE EVERY 8 HOURS AS NEEDED FOR VOMITING OR NAUSEA 30 tablet 1   OZEMPIC, 0.25 OR 0.5 MG/DOSE, 2 MG/3ML SOPN INJECT 0.5MG  UNDER THE SKIN ONCE A WEEK 9 mL 1   PARoxetine (PAXIL) 10 MG tablet Take 1 tablet (10 mg total) by mouth daily. HOT FLASHES 90 tablet 0   propranolol (INDERAL) 20 MG tablet TAKE 1 TABLET BY MOUTH  TWICE A DAY 180 tablet 0   rosuvastatin (CRESTOR) 10 MG tablet TAKE 1 TABLET(10 MG) BY MOUTH DAILY 90 tablet 0   VASCEPA 1 g capsule TAKE 1 CAPSULE(1 GRAM) BY MOUTH TWICE DAILY 180 capsule 1   zonisamide (ZONEGRAN) 50 MG capsule Take 150 mg by mouth daily.     No current facility-administered medications for this visit.    PAST MEDICAL HISTORY: Past Medical History:  Diagnosis Date   Anemia Dx January 03 2015   Diabetes mellitus without complication (HCC)    Elevated liver enzymes    Essential hypertension Dx Apr 2016   Fatty liver    H/O: upper GI bleed 03/2019    variceal bleed    Headache    History of blood transfusion 03/2019   Liver cirrhosis secondary to NASH (HCC)    Neuropathy    Pneumonia 03/2019   Reflux    Thrombocytopenia (HCC)     PAST SURGICAL HISTORY: Past Surgical History:  Procedure Laterality Date   DILITATION & CURRETTAGE/HYSTROSCOPY WITH HYDROTHERMAL ABLATION N/A 11/20/2021   Procedure: DILATATION &  CURETTAGE/HYSTEROSCOPY WITH HYDROTHERMAL ABLATION;  Surgeon: Reva Bores, MD;  Location: [facility] OR;  Service: Gynecology;  Laterality: N/A;  rep will be here confirmed on 11/12/21 CS   ESOPHAGEAL BANDING  03/22/2019   Procedure: ESOPHAGEAL BANDING;  Surgeon: Charlott Rakes, MD;  Location: [facility];  Service: Endoscopy;;   ESOPHAGEAL BANDING  03/23/2019   Procedure: ESOPHAGEAL BANDING;  Surgeon: Charlott Rakes, MD;  Location: [facility];  Service: Endoscopy;;   ESOPHAGEAL BANDING  09/20/2019   Procedure: ESOPHAGEAL BANDING;  Surgeon: Charlott Rakes, MD;  Location: [facility];  Service: Endoscopy;;   ESOPHAGEAL BANDING N/A 12/21/2019   Procedure: ESOPHAGEAL BANDING;  Surgeon: Charlott Rakes, MD;  Location: [facility];  Service: Endoscopy;  Laterality: N/A;   ESOPHAGOGASTRODUODENOSCOPY N/A 03/22/2019   Procedure: ESOPHAGOGASTRODUODENOSCOPY (EGD);  Surgeon: Charlott Rakes, MD;  Location: [facility];  Service: Endoscopy;  Laterality: N/A;   ESOPHAGOGASTRODUODENOSCOPY N/A 03/23/2019   Procedure: ESOPHAGOGASTRODUODENOSCOPY (EGD);  Surgeon: Charlott Rakes, MD;  Location: [facility];  Service: Endoscopy;  Laterality: N/A;   ESOPHAGOGASTRODUODENOSCOPY (EGD) WITH PROPOFOL N/A 07/23/2019   Procedure: ESOPHAGOGASTRODUODENOSCOPY (EGD) WITH PROPOFOL with possible banding;  Surgeon: Charlott Rakes, MD;  Location: [facility];  Service: Endoscopy;  Laterality: N/A;   ESOPHAGOGASTRODUODENOSCOPY (EGD) WITH PROPOFOL N/A 09/20/2019   Procedure: ESOPHAGOGASTRODUODENOSCOPY (EGD) WITH PROPOFOL;  Surgeon: Charlott Rakes, MD;  Location: [facility];  Service: Endoscopy;  Laterality: N/A;   ESOPHAGOGASTRODUODENOSCOPY (EGD) WITH PROPOFOL N/A 12/21/2019   Procedure: ESOPHAGOGASTRODUODENOSCOPY (EGD) WITH PROPOFOL with possible banding;  Surgeon: Charlott Rakes, MD;  Location: [facility];  Service: Endoscopy;  Laterality: N/A;   HERNIA REPAIR     TUBAL LIGATION  07/25/2000   VENTRAL  HERNIA REPAIR N/A 07/06/2020   Procedure: VENTRAL HERNIA REPAIR WITH MESH;  Surgeon: Abigail Miyamoto, MD;  Location: [facility] OR;  Service: General;  Laterality: N/A;    FAMILY HISTORY: Family History  Problem Relation Age of Onset   Diabetes Father    COPD Mother    Esophageal cancer Mother    Diabetes Mother    Cancer Mother        esophageal   Hypertension Mother    Breast cancer Maternal Aunt     SOCIAL HISTORY: Social History   Socioeconomic History   Marital status: Married    Spouse name: Not on file   Number of children: 2   Years of education: Not on file   Highest  education level: 12th grade  Occupational History    Comment: security guard at [employer]  Tobacco Use   Smoking status: Never   Smokeless tobacco: Never  Vaping Use   Vaping status: Never Used  Substance and Sexual Activity   Alcohol use: Not Currently    Comment: rarely-last alcohol drank in 08/2018   Drug use: No   Sexual activity: Not Currently    Birth control/protection: Surgical    Comment: BTL in 2021  Other Topics Concern   Not on file  Social History Narrative   Not on file   Social Drivers of Health   Financial Resource Strain: Low Risk  (12/07/2023)   Overall Financial Resource Strain (CARDIA)    Difficulty of Paying Living Expenses: Not hard at all  Food Insecurity: No Food Insecurity (12/07/2023)   Hunger Vital Sign    Worried About Running Out of Food in the Last Year: Never true    Ran Out of Food in the Last Year: Never true  Transportation Needs: No Transportation Needs (12/07/2023)   PRAPARE - Administrator, Civil Service (Medical): No    Lack of Transportation (Non-Medical): No  Physical Activity: Inactive (12/07/2023)   Exercise Vital Sign    Days of Exercise per Week: 0 days    Minutes of Exercise per Session: 10 min  Stress: No Stress Concern Present (12/07/2023)   [employer] of Occupational Health - Occupational Stress Questionnaire    Feeling of  Stress : Only a little  Social Connections: Socially Isolated (12/07/2023)   Social Connection and Isolation Panel [NHANES]    Frequency of Communication with Friends and Family: Once a week    Frequency of Social Gatherings with Friends and Family: Once a week    Attends Religious Services: Never    Database administrator or Organizations: No    Attends Engineer, structural: Not on file    Marital Status: Married  Catering manager Violence: Not on file      Levert Feinstein, M.D. Ph.D.  [facility] [address], [address] [place], [place] [address] Ph: [phone] Fax: [phone]  CC:  Claiborne Rigg, NP [address] [place] [address] [place],  [place] [address]  Claiborne Rigg, NP

## 2024-02-06 ENCOUNTER — Encounter: Payer: Self-pay | Admitting: Nurse Practitioner

## 2024-02-06 ENCOUNTER — Other Ambulatory Visit: Payer: Self-pay | Admitting: Nurse Practitioner

## 2024-02-06 DIAGNOSIS — M549 Dorsalgia, unspecified: Secondary | ICD-10-CM | POA: Diagnosis not present

## 2024-02-06 DIAGNOSIS — G8929 Other chronic pain: Secondary | ICD-10-CM

## 2024-02-06 DIAGNOSIS — M7662 Achilles tendinitis, left leg: Secondary | ICD-10-CM | POA: Diagnosis not present

## 2024-02-10 DIAGNOSIS — M7662 Achilles tendinitis, left leg: Secondary | ICD-10-CM | POA: Diagnosis not present

## 2024-02-10 DIAGNOSIS — M549 Dorsalgia, unspecified: Secondary | ICD-10-CM | POA: Diagnosis not present

## 2024-02-11 ENCOUNTER — Ambulatory Visit
Admission: RE | Admit: 2024-02-11 | Discharge: 2024-02-11 | Disposition: A | Discharge status: Home / Self Care | Source: Ambulatory Visit | Attending: Nurse Practitioner

## 2024-02-11 DIAGNOSIS — M25512 Pain in left shoulder: Secondary | ICD-10-CM

## 2024-02-11 DIAGNOSIS — G8929 Other chronic pain: Secondary | ICD-10-CM | POA: Diagnosis not present

## 2024-02-12 DIAGNOSIS — M7662 Achilles tendinitis, left leg: Secondary | ICD-10-CM | POA: Diagnosis not present

## 2024-02-12 DIAGNOSIS — M549 Dorsalgia, unspecified: Secondary | ICD-10-CM | POA: Diagnosis not present

## 2024-02-17 DIAGNOSIS — M7662 Achilles tendinitis, left leg: Secondary | ICD-10-CM | POA: Diagnosis not present

## 2024-02-17 DIAGNOSIS — M549 Dorsalgia, unspecified: Secondary | ICD-10-CM | POA: Diagnosis not present

## 2024-02-18 ENCOUNTER — Other Ambulatory Visit (INDEPENDENT_AMBULATORY_CARE_PROVIDER_SITE_OTHER): Payer: Self-pay

## 2024-02-18 ENCOUNTER — Ambulatory Visit (INDEPENDENT_AMBULATORY_CARE_PROVIDER_SITE_OTHER): Admitting: Surgical

## 2024-02-18 DIAGNOSIS — M792 Neuralgia and neuritis, unspecified: Secondary | ICD-10-CM | POA: Diagnosis not present

## 2024-02-18 DIAGNOSIS — R200 Anesthesia of skin: Secondary | ICD-10-CM | POA: Diagnosis not present

## 2024-02-18 DIAGNOSIS — M5412 Radiculopathy, cervical region: Secondary | ICD-10-CM

## 2024-02-20 ENCOUNTER — Encounter: Payer: Self-pay | Admitting: Nurse Practitioner

## 2024-02-23 ENCOUNTER — Other Ambulatory Visit: Payer: Self-pay

## 2024-02-23 ENCOUNTER — Encounter: Payer: Self-pay | Admitting: Surgical

## 2024-02-23 ENCOUNTER — Other Ambulatory Visit: Payer: Self-pay | Admitting: Nurse Practitioner

## 2024-02-23 ENCOUNTER — Encounter: Payer: Self-pay | Admitting: Nurse Practitioner

## 2024-02-23 DIAGNOSIS — E119 Type 2 diabetes mellitus without complications: Secondary | ICD-10-CM

## 2024-02-23 DIAGNOSIS — K219 Gastro-esophageal reflux disease without esophagitis: Secondary | ICD-10-CM

## 2024-02-23 DIAGNOSIS — E785 Hyperlipidemia, unspecified: Secondary | ICD-10-CM

## 2024-02-23 MED ORDER — ICOSAPENT ETHYL 1 G PO CAPS: 2.0000 g | ORAL_CAPSULE | Freq: Two times a day (BID) | ORAL | 1 refills | Status: DC

## 2024-02-23 MED ORDER — ROSUVASTATIN CALCIUM 10 MG PO TABS
10.0000 mg | ORAL_TABLET | Freq: Every day | ORAL | 0 refills | Status: DC
  Filled 2024-02-23: qty 90, 90d supply, fill #0

## 2024-02-23 MED ORDER — PROPRANOLOL HCL 20 MG PO TABS
20.0000 mg | ORAL_TABLET | Freq: Two times a day (BID) | ORAL | 1 refills | Status: DC
  Filled 2024-02-23: qty 180, 90d supply, fill #0

## 2024-02-23 MED ORDER — ROSUVASTATIN CALCIUM 10 MG PO TABS: 10.0000 mg | ORAL_TABLET | Freq: Every day | ORAL | 1 refills | Status: DC

## 2024-02-23 MED ORDER — DAPAGLIFLOZIN PROPANEDIOL 5 MG PO TABS
5.0000 mg | ORAL_TABLET | Freq: Every day | ORAL | 1 refills | Status: DC
  Filled 2024-02-23: qty 90, 90d supply, fill #0

## 2024-02-23 MED ORDER — ICOSAPENT ETHYL 1 G PO CAPS
2.0000 g | ORAL_CAPSULE | Freq: Two times a day (BID) | ORAL | 1 refills | Status: DC
  Filled 2024-02-23: qty 180, 45d supply, fill #0

## 2024-02-23 MED ORDER — PROPRANOLOL HCL 20 MG PO TABS: 20.0000 mg | ORAL_TABLET | Freq: Two times a day (BID) | ORAL | 1 refills | Status: AC

## 2024-02-23 MED ORDER — DAPAGLIFLOZIN PROPANEDIOL 5 MG PO TABS: 5.0000 mg | ORAL_TABLET | Freq: Every day | ORAL | 1 refills | Status: DC

## 2024-02-23 NOTE — Progress Notes (Signed)
 Office Visit Note   Patient: Angela Castillo           Date of Birth: June 24, 1976           MRN: [id number] Visit Date: 02/18/2024 Requested by: Collins Dean, NP [address] [place] [address] [place],  [place] [address] PCP: Collins Dean, NP  Subjective: Chief Complaint  Patient presents with   Left Shoulder - Pain   Left Arm - Numbness    HPI: Angela Castillo is a 48 y.o. female who presents to the office reporting left arm numbness and tingling.  Patient states that over the last year she has been experiencing left arm numbness and tingling primarily starting in the mid humeral to proximal humerus region that extends down the entirety of the left arm into the fingers.  She feels a lot of pressure like someone is "squeezing" her arm.  She feels these symptoms about 90 to 95% of her daily life.  She has occasional weakness with every day activities that is inconsistent in terms of the types of activities she performs.  She does not have any reliable weakness that is reproducible with overhead motion for example.  She has more discomfort than actual pain.  Does have occasional neck pain but no scapular pain.  No diplopia.  She does have migraines for which she takes medication which have been ongoing for about 4 to 6 months.  She has no history of prior neck or shoulder surgery.  Does have history of diabetes but A1c is below 7.  She has no neurologic history that she can recall and her parents are deceased.  She has seen Dr. Gracie Lav with neurology with nerve conduction study showing only mild bilateral carpal tunnel syndrome.  She also had cervical spine MRI demonstrating no significant spinal or foraminal stenosis.                ROS: All systems reviewed are negative as they relate to the chief complaint within the history of present illness.  Patient denies fevers or chills.  Assessment & Plan: Visit Diagnoses:  1. Left arm numbness   2. Radicular pain in left arm      Plan: Patient is a 49 year old female who presents for evaluation of left arm numbness/tingling.  She has had symptoms ongoing for about a year with some discomfort but no actual pain.  It is more of an uncomfortable sensation for her and a pressure sensation.  She has had evaluation with neurology with nerve conduction study and MRI of the cervical spine demonstrating no significant abnormality.  She does have discomfort and increased numbness and tingling that is reproduced with Morley's test on exam today and has cervical spine radiographs that demonstrate asymmetrically prominent C7 vertebral process on the left side that could contribute to thoracic outlet syndrome.  With her negative studies thus far, think that the next step here would be to have her work with physical therapy for a therapy program and home exercise program designed for left thoracic outlet syndrome and we will obtain MRI of the brachial plexus for further evaluation.  Follow-up after MRI to review results.  Could also consider scalene lidocaine  injection diagnostically with Dr. Vaughn Georges at some point in the future.    Follow-Up Instructions: No follow-ups on file.   Orders:  Orders Placed This Encounter  Procedures   XR Cervical Spine 2 or 3 views   MR BRACHIAL PLEXUS W WO CM   No orders  of the defined types were placed in this encounter.     Procedures: No procedures performed   Clinical Data: No additional findings.  Objective: Vital Signs: LMP 10/24/2021   Physical Exam:  Constitutional: Patient appears well-developed HEENT:  Head: Normocephalic Eyes:EOM are normal Neck: Normal range of motion Cardiovascular: Normal rate Pulmonary/chest: Effort normal Neurologic: Patient is alert Skin: Skin is warm Psychiatric: Patient has normal mood and affect  Ortho Exam: Ortho exam demonstrates left shoulder with well-preserved active and passive range of motion of the left shoulder that is equivalent to the  contralateral side.  She has really no pain reproduced with shoulder range of motion.  There is no atrophy noted of the left arm relative to the right.  She has intact EPL, FPL, finger abduction, grip strength testing, pronation/supination, bicep, tricep, deltoid with strength rated 5/5 bilaterally.  She has no pain reproduced with cervical spine range of motion.  Excellent rotator cuff strength of supra, infra, subscap without reproduction of pain in the left shoulder.  She has no tenderness over the [facility] joint bilaterally.  No tenderness over the bicipital groove bilaterally.  Negative Neer and Hawkins impingement signs.  There is palpable radial pulse rated 2+ in the left arm with no significant change when the arm is raised above her head.  There is positive Morley's test on the left with reproduction of the pressure sensation as well as increased numbness and tingling.  After pressure is released, patient does have relief of her symptoms.  Specialty Comments:  No specialty comments available.  Imaging: No results found.   PMFS History: Patient Active Problem List   Diagnosis Date Noted   Carpal tunnel syndrome of left wrist 02/05/2024   Paresthesia 02/18/2023   Neck pain 02/18/2023   Abnormal uterine bleeding 11/20/2021   Menorrhagia 11/01/2021   Ingrown toenail 10/16/2019   Cirrhosis (HCC) 07/23/2019   Diabetes mellitus (HCC) 04/20/2019   Acute respiratory insufficiency    H/O: upper GI bleed 03/21/2019   Impingement syndrome of left shoulder 04/21/2017   Low back pain 03/18/2016   Left shoulder pain 03/18/2016   Esophageal reflux 03/18/2016   Lateral pain of left hip 10/25/2015   Heel pain, bilateral 05/03/2015   Obesity (BMI 30-39.9) 05/03/2015   Hearing loss in right ear 03/01/2015   Family history of diabetes mellitus in mother 03/01/2015   Essential hypertension 01/18/2015   Anemia, iron deficiency 01/04/2015   Musculoskeletal chest pain 01/04/2015   Thrombocytopenia (HCC)  01/04/2015   Transaminitis 01/03/2015   Past Medical History:  Diagnosis Date   Anemia Dx January 03 2015   Diabetes mellitus without complication (HCC)    Elevated liver enzymes    Essential hypertension Dx Apr 2016   Fatty liver    H/O: upper GI bleed 03/2019    variceal bleed    Headache    History of blood transfusion 03/2019   Liver cirrhosis secondary to NASH (HCC)    Neuropathy    Pneumonia 03/2019   Reflux    Thrombocytopenia (HCC)     Family History  Problem Relation Age of Onset   Diabetes Father    COPD Mother    Esophageal cancer Mother    Diabetes Mother    Cancer Mother        esophageal   Hypertension Mother    Breast cancer Maternal Aunt     Past Surgical History:  Procedure Laterality Date   DILITATION & CURRETTAGE/HYSTROSCOPY WITH HYDROTHERMAL ABLATION N/A 11/20/2021  Procedure: DILATATION & CURETTAGE/HYSTEROSCOPY WITH HYDROTHERMAL ABLATION;  Surgeon: Granville Layer, MD;  Location: [facility] OR;  Service: Gynecology;  Laterality: N/A;  rep will be here confirmed on 11/12/21 CS   ESOPHAGEAL BANDING  03/22/2019   Procedure: ESOPHAGEAL BANDING;  Surgeon: Baldo Bonds, MD;  Location: [facility];  Service: Endoscopy;;   ESOPHAGEAL BANDING  03/23/2019   Procedure: ESOPHAGEAL BANDING;  Surgeon: Baldo Bonds, MD;  Location: [facility];  Service: Endoscopy;;   ESOPHAGEAL BANDING  09/20/2019   Procedure: ESOPHAGEAL BANDING;  Surgeon: Baldo Bonds, MD;  Location: [facility];  Service: Endoscopy;;   ESOPHAGEAL BANDING N/A 12/21/2019   Procedure: ESOPHAGEAL BANDING;  Surgeon: Baldo Bonds, MD;  Location: [facility];  Service: Endoscopy;  Laterality: N/A;   ESOPHAGOGASTRODUODENOSCOPY N/A 03/22/2019   Procedure: ESOPHAGOGASTRODUODENOSCOPY (EGD);  Surgeon: Baldo Bonds, MD;  Location: [facility];  Service: Endoscopy;  Laterality: N/A;   ESOPHAGOGASTRODUODENOSCOPY N/A 03/23/2019   Procedure: ESOPHAGOGASTRODUODENOSCOPY (EGD);  Surgeon: Baldo Bonds, MD;  Location: [facility];  Service: Endoscopy;  Laterality: N/A;   ESOPHAGOGASTRODUODENOSCOPY (EGD) WITH PROPOFOL  N/A 07/23/2019   Procedure: ESOPHAGOGASTRODUODENOSCOPY (EGD) WITH PROPOFOL  with possible banding;  Surgeon: Baldo Bonds, MD;  Location: [facility];  Service: Endoscopy;  Laterality: N/A;   ESOPHAGOGASTRODUODENOSCOPY (EGD) WITH PROPOFOL  N/A 09/20/2019   Procedure: ESOPHAGOGASTRODUODENOSCOPY (EGD) WITH PROPOFOL ;  Surgeon: Baldo Bonds, MD;  Location: [facility];  Service: Endoscopy;  Laterality: N/A;   ESOPHAGOGASTRODUODENOSCOPY (EGD) WITH PROPOFOL  N/A 12/21/2019   Procedure: ESOPHAGOGASTRODUODENOSCOPY (EGD) WITH PROPOFOL  with possible banding;  Surgeon: Baldo Bonds, MD;  Location: [facility];  Service: Endoscopy;  Laterality: N/A;   HERNIA REPAIR     TUBAL LIGATION  07/25/2000   VENTRAL HERNIA REPAIR N/A 07/06/2020   Procedure: VENTRAL HERNIA REPAIR WITH MESH;  Surgeon: Oza Blumenthal, MD;  Location: [facility] OR;  Service: General;  Laterality: N/A;   Social History   Occupational History    Comment: security guard at [employer]  Tobacco Use   Smoking status: Never   Smokeless tobacco: Never  Vaping Use   Vaping status: Never Used  Substance and Sexual Activity   Alcohol use: Not Currently    Comment: rarely-last alcohol drank in 08/2018   Drug use: No   Sexual activity: Not Currently    Birth control/protection: Surgical    Comment: BTL in 2021

## 2024-02-24 DIAGNOSIS — M7662 Achilles tendinitis, left leg: Secondary | ICD-10-CM | POA: Diagnosis not present

## 2024-02-24 DIAGNOSIS — M549 Dorsalgia, unspecified: Secondary | ICD-10-CM | POA: Diagnosis not present

## 2024-02-25 ENCOUNTER — Encounter: Payer: Self-pay | Admitting: Nurse Practitioner

## 2024-02-25 DIAGNOSIS — M542 Cervicalgia: Secondary | ICD-10-CM | POA: Diagnosis not present

## 2024-02-25 DIAGNOSIS — G43719 Chronic migraine without aura, intractable, without status migrainosus: Secondary | ICD-10-CM | POA: Diagnosis not present

## 2024-02-26 DIAGNOSIS — M7662 Achilles tendinitis, left leg: Secondary | ICD-10-CM | POA: Diagnosis not present

## 2024-02-26 DIAGNOSIS — M549 Dorsalgia, unspecified: Secondary | ICD-10-CM | POA: Diagnosis not present

## 2024-02-27 ENCOUNTER — Other Ambulatory Visit: Payer: Self-pay | Admitting: Nurse Practitioner

## 2024-02-27 ENCOUNTER — Other Ambulatory Visit: Payer: Self-pay

## 2024-02-27 MED ORDER — MUPIROCIN 2 % EX OINT
TOPICAL_OINTMENT | Freq: Two times a day (BID) | CUTANEOUS | 0 refills | Status: DC
  Filled 2024-02-27: qty 22, 10d supply, fill #0

## 2024-02-27 MED ORDER — MUPIROCIN 2 % EX OINT: TOPICAL_OINTMENT | Freq: Two times a day (BID) | CUTANEOUS | 0 refills | Status: DC

## 2024-03-01 ENCOUNTER — Other Ambulatory Visit: Payer: Self-pay

## 2024-03-02 DIAGNOSIS — M549 Dorsalgia, unspecified: Secondary | ICD-10-CM | POA: Diagnosis not present

## 2024-03-02 DIAGNOSIS — M7662 Achilles tendinitis, left leg: Secondary | ICD-10-CM | POA: Diagnosis not present

## 2024-03-03 ENCOUNTER — Encounter: Payer: Self-pay | Admitting: Surgical

## 2024-03-03 DIAGNOSIS — K7460 Unspecified cirrhosis of liver: Secondary | ICD-10-CM | POA: Diagnosis not present

## 2024-03-04 DIAGNOSIS — M7662 Achilles tendinitis, left leg: Secondary | ICD-10-CM | POA: Diagnosis not present

## 2024-03-04 DIAGNOSIS — M549 Dorsalgia, unspecified: Secondary | ICD-10-CM | POA: Diagnosis not present

## 2024-03-09 ENCOUNTER — Ambulatory Visit: Payer: BC Managed Care – PPO | Attending: Nurse Practitioner | Admitting: Nurse Practitioner

## 2024-03-09 ENCOUNTER — Encounter: Payer: Self-pay | Admitting: Nurse Practitioner

## 2024-03-09 VITALS — BP 121/76 | HR 61 | Resp 20 | Ht 64.5 in | Wt 187.2 lb

## 2024-03-09 DIAGNOSIS — L089 Local infection of the skin and subcutaneous tissue, unspecified: Secondary | ICD-10-CM

## 2024-03-09 DIAGNOSIS — I10 Essential (primary) hypertension: Secondary | ICD-10-CM | POA: Diagnosis not present

## 2024-03-09 MED ORDER — SULFAMETHOXAZOLE-TRIMETHOPRIM 800-160 MG PO TABS: 1.0000 | ORAL_TABLET | Freq: Two times a day (BID) | ORAL | 0 refills | Status: AC

## 2024-03-09 NOTE — Progress Notes (Signed)
 Assessment & Plan:  Angela Castillo was seen today for hypertension.  Diagnoses and all orders for this visit:  Primary hypertension -     CMP14+EGFR Continue all antihypertensives as prescribed.  Reminded to bring in blood pressure log for follow  up appointment.  RECOMMENDATIONS: DASH/Mediterranean Diets are healthier choices for HTN.    Skin infection -     sulfamethoxazole -trimethoprim  (BACTRIM  DS) 800-160 MG tablet; Take 1 tablet by mouth 2 (two) times daily for 5 days.    Patient has been counseled on age-appropriate routine health concerns for screening and prevention. These are reviewed and up-to-date. Referrals have been placed accordingly. Immunizations are up-to-date or declined.    Subjective:   Chief Complaint  Patient presents with   Hypertension    Angela Castillo 48 y.o. female presents to office today for follow up to HTN  She has a past medical history of Anemia (Dx January 03 2015), DM2, Elevated liver enzymes, Essential hypertension (Dx Apr 2016), Fatty liver, H/O: upper GI bleed (03/2019), Headache, History of blood transfusion (03/2019), Liver cirrhosis secondary to NASH, Neuropathy, Pneumonia (03/2019), Reflux, and Thrombocytopenia.   She was bitten on the lower left leg by one of her dogs 8 days ago. She states the area does look better however there is surrounding erythema that is concerning to me. I will send an antibiotic today to treat for presumed mild cellulitis. She has been using topical bactroban  and peroxide   HTN Blood pressure is well controlled. She is taking losartan  50 mg daily and propranolol  20 mg BID.  BP Readings from Last 3 Encounters:  03/09/24 121/76  02/05/24 132/86  01/24/24 125/80     Review of Systems  Constitutional:  Negative for fever, malaise/fatigue and weight loss.  HENT: Negative.  Negative for nosebleeds.   Eyes: Negative.  Negative for blurred vision, double vision and photophobia.  Respiratory: Negative.   Negative for cough and shortness of breath.   Cardiovascular: Negative.  Negative for chest pain, palpitations and leg swelling.  Gastrointestinal: Negative.  Negative for heartburn, nausea and vomiting.  Musculoskeletal: Negative.  Negative for myalgias.  Skin:        SEE HPI  Neurological: Negative.  Negative for dizziness, focal weakness, seizures and headaches.  Psychiatric/Behavioral: Negative.  Negative for suicidal ideas.     Past Medical History:  Diagnosis Date   Anemia Dx January 03 2015   Diabetes mellitus without complication (HCC)    Elevated liver enzymes    Essential hypertension Dx Apr 2016   Fatty liver    H/O: upper GI bleed 03/2019    variceal bleed    Headache    History of blood transfusion 03/2019   Liver cirrhosis secondary to NASH (HCC)    Neuropathy    Pneumonia 03/2019   Reflux    Thrombocytopenia (HCC)     Past Surgical History:  Procedure Laterality Date   DILITATION & CURRETTAGE/HYSTROSCOPY WITH HYDROTHERMAL ABLATION N/A 11/20/2021   Procedure: DILATATION & CURETTAGE/HYSTEROSCOPY WITH HYDROTHERMAL ABLATION;  Surgeon: Granville Layer, MD;  Location: [facility] OR;  Service: Gynecology;  Laterality: N/A;  rep will be here confirmed on 11/12/21 CS   ESOPHAGEAL BANDING  03/22/2019   Procedure: ESOPHAGEAL BANDING;  Surgeon: Baldo Bonds, MD;  Location: [facility];  Service: Endoscopy;;   ESOPHAGEAL BANDING  03/23/2019   Procedure: ESOPHAGEAL BANDING;  Surgeon: Baldo Bonds, MD;  Location: [facility];  Service: Endoscopy;;   ESOPHAGEAL BANDING  09/20/2019   Procedure: ESOPHAGEAL BANDING;  Surgeon:  Baldo Bonds, MD;  Location: [facility];  Service: Endoscopy;;   ESOPHAGEAL BANDING N/A 12/21/2019   Procedure: ESOPHAGEAL BANDING;  Surgeon: Baldo Bonds, MD;  Location: [facility];  Service: Endoscopy;  Laterality: N/A;   ESOPHAGOGASTRODUODENOSCOPY N/A 03/22/2019   Procedure: ESOPHAGOGASTRODUODENOSCOPY (EGD);  Surgeon: Baldo Bonds, MD;   Location: [facility];  Service: Endoscopy;  Laterality: N/A;   ESOPHAGOGASTRODUODENOSCOPY N/A 03/23/2019   Procedure: ESOPHAGOGASTRODUODENOSCOPY (EGD);  Surgeon: Baldo Bonds, MD;  Location: [facility];  Service: Endoscopy;  Laterality: N/A;   ESOPHAGOGASTRODUODENOSCOPY (EGD) WITH PROPOFOL  N/A 07/23/2019   Procedure: ESOPHAGOGASTRODUODENOSCOPY (EGD) WITH PROPOFOL  with possible banding;  Surgeon: Baldo Bonds, MD;  Location: [facility];  Service: Endoscopy;  Laterality: N/A;   ESOPHAGOGASTRODUODENOSCOPY (EGD) WITH PROPOFOL  N/A 09/20/2019   Procedure: ESOPHAGOGASTRODUODENOSCOPY (EGD) WITH PROPOFOL ;  Surgeon: Baldo Bonds, MD;  Location: [facility];  Service: Endoscopy;  Laterality: N/A;   ESOPHAGOGASTRODUODENOSCOPY (EGD) WITH PROPOFOL  N/A 12/21/2019   Procedure: ESOPHAGOGASTRODUODENOSCOPY (EGD) WITH PROPOFOL  with possible banding;  Surgeon: Baldo Bonds, MD;  Location: [facility];  Service: Endoscopy;  Laterality: N/A;   HERNIA REPAIR     TUBAL LIGATION  07/25/2000   VENTRAL HERNIA REPAIR N/A 07/06/2020   Procedure: VENTRAL HERNIA REPAIR WITH MESH;  Surgeon: Oza Blumenthal, MD;  Location: [facility] OR;  Service: General;  Laterality: N/A;    Family History  Problem Relation Age of Onset   Diabetes Father    COPD Mother    Esophageal cancer Mother    Diabetes Mother    Cancer Mother        esophageal   Hypertension Mother    Breast cancer Maternal Aunt     Social History Reviewed with no changes to be made today.   Outpatient Medications Prior to Visit  Medication Sig Dispense Refill   Accu-Chek Softclix Lancets lancets USE TO CHECK BLOOD SUGAR THREE TIMES DAILY 300 each 0   acetaminophen  (TYLENOL ) 500 MG tablet Take 500-1,000 mg by mouth every 6 (six) hours as needed for mild pain.     albuterol  (VENTOLIN  HFA) 108 (90 Base) MCG/ACT inhaler Inhale 2 puffs into the lungs every 6 (six) hours as needed for wheezing or shortness of breath. 6.7 g 1   Biotin 69629  MCG TABS Take 10,000 mcg by mouth daily.     Blood Glucose Monitoring Suppl (ACCU-CHEK GUIDE) w/Device KIT Use to check blood sugar three times daily. 1 kit 0   dapagliflozin  propanediol (FARXIGA ) 5 MG TABS tablet Take 1 tablet (5 mg total) by mouth daily. 90 tablet 1   diclofenac  Sodium (VOLTAREN ) 1 % GEL Apply 4 g topically 4 (four) times daily. 200 g 1   esomeprazole  (NEXIUM ) 40 MG capsule Take 40 mg by mouth 2 (two) times daily before a meal.     ferrous sulfate  325 (65 FE) MG tablet Take 1 tablet (325 mg total) by mouth 2 (two) times daily with a meal. 180 tablet 2   fluticasone  (FLONASE ) 50 MCG/ACT nasal spray Place 2 sprays into both nostrils daily. 16 g 6   glucose blood (ACCU-CHEK GUIDE) test strip USE TO CHECK BLOOD SUGAR THREE TIMES DAILY 300 strip 0   icosapent  Ethyl (VASCEPA ) 1 g capsule Take 2 capsules (2 g total) by mouth 2 (two) times daily. 180 capsule 1   ketoconazole  (NIZORAL ) 2 % cream Apply 1 application  topically daily as needed for irritation (as needed).     loratadine  (CLARITIN ) 10 MG tablet TAKE 1 TABLET BY MOUTH EVERY DAY 90 tablet 1  losartan  (COZAAR ) 50 MG tablet Take 1 tablet (50 mg total) by mouth daily. 90 tablet 3   Menthol, Topical Analgesic, (ICY HOT MEDICATED SPRAY) 16 % LIQD Apply 1 spray topically daily as needed (pain).     metFORMIN  (GLUCOPHAGE ) 500 MG tablet Take 1 tablet (500 mg total) by mouth 2 (two) times daily with a meal. 180 tablet 1   methocarbamol  (ROBAXIN ) 500 MG tablet Take 1 tablet (500 mg total) by mouth every 8 (eight) hours as needed for muscle spasms. 60 tablet 3   Microlet Lancets MISC USE UP TO FOUR TIMES DAILY AS DIRECTED 100 each 11   mupirocin  ointment (BACTROBAN ) 2 % Apply topically 2 (two) times daily. 22 g 0   ondansetron  (ZOFRAN -ODT) 4 MG disintegrating tablet DISSOLVE 1 TABLET(4 MG) ON THE TONGUE EVERY 8 HOURS AS NEEDED FOR VOMITING OR NAUSEA 30 tablet 1   OZEMPIC , 0.25 OR 0.5 MG/DOSE, 2 MG/3ML SOPN INJECT 0.5MG  UNDER THE SKIN  ONCE A WEEK 9 mL 1   PARoxetine  (PAXIL ) 10 MG tablet Take 1 tablet (10 mg total) by mouth daily. HOT FLASHES 90 tablet 0   propranolol  (INDERAL ) 20 MG tablet Take 1 tablet (20 mg total) by mouth 2 (two) times daily. 180 tablet 1   rosuvastatin  (CRESTOR ) 10 MG tablet Take 1 tablet (10 mg total) by mouth daily. 90 tablet 1   zonisamide (ZONEGRAN) 50 MG capsule Take 150 mg by mouth daily.     No facility-administered medications prior to visit.    Allergies  Allergen Reactions   Nsaids     GI BLEED       Objective:    BP 121/76 (BP Location: Left Arm, Patient Position: Sitting, Cuff Size: Normal)   Pulse 61   Resp 20   Ht 5' 4.5" (1.638 m)   Wt 187 lb 3.2 oz (84.9 kg)   LMP 10/24/2021   SpO2 100%   BMI 31.64 kg/m  Wt Readings from Last 3 Encounters:  03/09/24 187 lb 3.2 oz (84.9 kg)  02/05/24 188 lb 8 oz (85.5 kg)  01/24/24 191 lb 5.8 oz (86.8 kg)    Physical Exam Vitals and nursing note reviewed.  Constitutional:      Appearance: She is well-developed.  HENT:     Head: Normocephalic and atraumatic.  Cardiovascular:     Rate and Rhythm: Normal rate and regular rhythm.     Heart sounds: Normal heart sounds. No murmur heard.    No friction rub. No gallop.  Pulmonary:     Effort: Pulmonary effort is normal. No tachypnea or respiratory distress.     Breath sounds: Normal breath sounds. No decreased breath sounds, wheezing, rhonchi or rales.  Chest:     Chest wall: No tenderness.  Abdominal:     General: Bowel sounds are normal.     Palpations: Abdomen is soft.  Musculoskeletal:        General: Normal range of motion.     Cervical back: Normal range of motion.  Skin:    General: Skin is warm and dry.  Neurological:     Mental Status: She is alert and oriented to person, place, and time.     Coordination: Coordination normal.  Psychiatric:        Behavior: Behavior normal. Behavior is cooperative.        Thought Content: Thought content normal.        Judgment:  Judgment normal.          Patient has been  counseled extensively about nutrition and exercise as well as the importance of adherence with medications and regular follow-up. The patient was given clear instructions to go to ER or return to medical center if symptoms don't improve, worsen or new problems develop. The patient verbalized understanding.   Follow-up: Return in about 3 months (around 06/09/2024).   Collins Dean, FNP-BC [facility] [place], [place] [phone]   03/09/2024, 10:24 PM

## 2024-03-10 LAB — CMP14+EGFR
ALT: 24 IU/L (ref 0–32)
AST: 26 IU/L (ref 0–40)
Albumin: 4.5 g/dL (ref 3.9–4.9)
Alkaline Phosphatase: 123 IU/L — ABNORMAL HIGH (ref 44–121)
BUN/Creatinine Ratio: 14 (ref 9–23)
BUN: 10 mg/dL (ref 6–24)
Bilirubin Total: 0.5 mg/dL (ref 0.0–1.2)
CO2: 24 mmol/L (ref 20–29)
Calcium: 9.8 mg/dL (ref 8.7–10.2)
Chloride: 104 mmol/L (ref 96–106)
Creatinine, Ser: 0.69 mg/dL (ref 0.57–1.00)
Globulin, Total: 3.1 g/dL (ref 1.5–4.5)
Glucose: 81 mg/dL (ref 70–99)
Potassium: 4.1 mmol/L (ref 3.5–5.2)
Sodium: 142 mmol/L (ref 134–144)
Total Protein: 7.6 g/dL (ref 6.0–8.5)
eGFR: 108 mL/min/{1.73_m2} (ref >59)

## 2024-03-11 DIAGNOSIS — M7662 Achilles tendinitis, left leg: Secondary | ICD-10-CM | POA: Diagnosis not present

## 2024-03-11 DIAGNOSIS — M549 Dorsalgia, unspecified: Secondary | ICD-10-CM | POA: Diagnosis not present

## 2024-03-14 ENCOUNTER — Encounter: Payer: Self-pay | Admitting: Nurse Practitioner

## 2024-03-15 ENCOUNTER — Encounter: Payer: Self-pay | Admitting: Nurse Practitioner

## 2024-03-15 DIAGNOSIS — R531 Weakness: Secondary | ICD-10-CM | POA: Diagnosis not present

## 2024-03-15 DIAGNOSIS — G540 Brachial plexus disorders: Secondary | ICD-10-CM | POA: Diagnosis not present

## 2024-03-22 DIAGNOSIS — R531 Weakness: Secondary | ICD-10-CM | POA: Diagnosis not present

## 2024-03-22 DIAGNOSIS — G540 Brachial plexus disorders: Secondary | ICD-10-CM | POA: Diagnosis not present

## 2024-03-24 DIAGNOSIS — M542 Cervicalgia: Secondary | ICD-10-CM | POA: Diagnosis not present

## 2024-03-24 DIAGNOSIS — G43719 Chronic migraine without aura, intractable, without status migrainosus: Secondary | ICD-10-CM | POA: Diagnosis not present

## 2024-03-25 ENCOUNTER — Other Ambulatory Visit: Payer: Self-pay

## 2024-03-25 ENCOUNTER — Emergency Department (HOSPITAL_BASED_OUTPATIENT_CLINIC_OR_DEPARTMENT_OTHER)
Admission: EM | Admit: 2024-03-25 | Discharge: 2024-03-25 | Disposition: A | Discharge status: Home / Self Care | Attending: Emergency Medicine | Admitting: Emergency Medicine

## 2024-03-25 ENCOUNTER — Encounter (HOSPITAL_BASED_OUTPATIENT_CLINIC_OR_DEPARTMENT_OTHER): Payer: Self-pay | Admitting: Emergency Medicine

## 2024-03-25 ENCOUNTER — Other Ambulatory Visit: Payer: Self-pay | Admitting: Family Medicine

## 2024-03-25 DIAGNOSIS — R531 Weakness: Secondary | ICD-10-CM | POA: Diagnosis not present

## 2024-03-25 DIAGNOSIS — Z7984 Long term (current) use of oral hypoglycemic drugs: Secondary | ICD-10-CM | POA: Diagnosis not present

## 2024-03-25 DIAGNOSIS — S51852A Open bite of left forearm, initial encounter: Secondary | ICD-10-CM | POA: Diagnosis not present

## 2024-03-25 DIAGNOSIS — Z79899 Other long term (current) drug therapy: Secondary | ICD-10-CM | POA: Insufficient documentation

## 2024-03-25 DIAGNOSIS — I10 Essential (primary) hypertension: Secondary | ICD-10-CM | POA: Diagnosis not present

## 2024-03-25 DIAGNOSIS — E119 Type 2 diabetes mellitus without complications: Secondary | ICD-10-CM | POA: Insufficient documentation

## 2024-03-25 DIAGNOSIS — S81832A Puncture wound without foreign body, left lower leg, initial encounter: Secondary | ICD-10-CM | POA: Diagnosis not present

## 2024-03-25 DIAGNOSIS — W540XXA Bitten by dog, initial encounter: Secondary | ICD-10-CM | POA: Diagnosis not present

## 2024-03-25 DIAGNOSIS — S50812A Abrasion of left forearm, initial encounter: Secondary | ICD-10-CM | POA: Insufficient documentation

## 2024-03-25 DIAGNOSIS — G540 Brachial plexus disorders: Secondary | ICD-10-CM | POA: Diagnosis not present

## 2024-03-25 DIAGNOSIS — S81852A Open bite, left lower leg, initial encounter: Secondary | ICD-10-CM | POA: Diagnosis not present

## 2024-03-25 DIAGNOSIS — K219 Gastro-esophageal reflux disease without esophagitis: Secondary | ICD-10-CM

## 2024-03-25 MED ORDER — AMOXICILLIN-POT CLAVULANATE 875-125 MG PO TABS: 1.0000 | ORAL_TABLET | Freq: Two times a day (BID) | ORAL | 0 refills | Status: DC

## 2024-03-25 MED ORDER — AMOXICILLIN-POT CLAVULANATE 875-125 MG PO TABS
1.0000 | ORAL_TABLET | Freq: Once | ORAL | Status: AC
  Administered 2024-03-25: 1 via ORAL
  Filled 2024-03-25: qty 1

## 2024-03-25 NOTE — ED Provider Notes (Signed)
 [facility] Provider Note   CSN: [id number] Arrival date & time: 03/25/24  1910     History  Chief Complaint  Patient presents with   Animal Bite    Angela Castillo is a 48 y.o. female with past medical history of IDA, HTN, GERD, diabetes presents emergency department for evaluation of dog bite.  She reports that her [place] got spooked and bit her on the left calf and left forearm.  Her and her husband cleaned wound and placed bacitracin cream on it.  She is up-to-date on her tetanus (2023).  Her dog is up-to-date on vaccines and rabies vaccine.    Animal Bite Associated symptoms: no fever and no numbness       Home Medications Prior to Admission medications   Medication Sig Start Date End Date Taking? Authorizing Provider  amoxicillin-clavulanate (AUGMENTIN) 875-125 MG tablet Take 1 tablet by mouth every 12 (twelve) hours. 03/25/24  Yes Royann Cords, PA  Accu-Chek Softclix Lancets lancets USE TO CHECK BLOOD SUGAR THREE TIMES DAILY 12/04/22   Newlin, Enobong, MD  acetaminophen  (TYLENOL ) 500 MG tablet Take 500-1,000 mg by mouth every 6 (six) hours as needed for mild pain.    [provider]  albuterol  (VENTOLIN  HFA) 108 (90 Base) MCG/ACT inhaler Inhale 2 puffs into the lungs every 6 (six) hours as needed for wheezing or shortness of breath. 09/07/23   Fleming, Zelda W, NP  Biotin 86578 MCG TABS Take 10,000 mcg by mouth daily.    [provider]  Blood Glucose Monitoring Suppl (ACCU-CHEK GUIDE) w/Device KIT Use to check blood sugar three times daily. 05/01/22   Newlin, Enobong, MD  dapagliflozin  propanediol (FARXIGA ) 5 MG TABS tablet Take 1 tablet (5 mg total) by mouth daily. 02/23/24   Fleming, Zelda W, NP  diclofenac  Sodium (VOLTAREN ) 1 % GEL Apply 4 g topically 4 (four) times daily. 12/10/23   Fleming, Zelda W, NP  esomeprazole  (NEXIUM ) 40 MG capsule Take 40 mg by mouth 2 (two) times daily before a meal.  03/05/23   [provider]  ferrous sulfate  325 (65 FE) MG tablet Take 1 tablet (325 mg total) by mouth 2 (two) times daily with a meal. 09/09/23   Collins Dean, NP  fluticasone  (FLONASE ) 50 MCG/ACT nasal spray Place 2 sprays into both nostrils daily. 01/12/24   Fleming, Zelda W, NP  glucose blood (ACCU-CHEK GUIDE) test strip USE TO CHECK BLOOD SUGAR THREE TIMES DAILY 12/04/22   Newlin, Enobong, MD  icosapent  Ethyl (VASCEPA ) 1 g capsule Take 2 capsules (2 g total) by mouth 2 (two) times daily. 02/23/24   Fleming, Zelda W, NP  ketoconazole  (NIZORAL ) 2 % cream Apply 1 application  topically daily as needed for irritation (as needed).    [provider]  loratadine  (CLARITIN ) 10 MG tablet TAKE 1 TABLET BY MOUTH EVERY DAY 05/28/22   Fleming, Zelda W, NP  losartan  (COZAAR ) 50 MG tablet Take 1 tablet (50 mg total) by mouth daily. 07/25/23 02/04/25  Wendie Hamburg, MD  Menthol, Topical Analgesic, (ICY HOT MEDICATED SPRAY) 16 % LIQD Apply 1 spray topically daily as needed (pain).    [provider]  metFORMIN  (GLUCOPHAGE ) 500 MG tablet Take 1 tablet (500 mg total) by mouth 2 (two) times daily with a meal. 09/09/23   Collins Dean, NP  methocarbamol  (ROBAXIN ) 500 MG tablet Take 1 tablet (500 mg total) by mouth every 8 (eight) hours as needed for  muscle spasms. 12/10/23   Fleming, Zelda W, NP  Microlet Lancets MISC USE UP TO FOUR TIMES DAILY AS DIRECTED 04/11/21   Fleming, Zelda W, NP  mupirocin  ointment (BACTROBAN ) 2 % Apply topically 2 (two) times daily. 02/27/24   Fleming, Zelda W, NP  ondansetron  (ZOFRAN -ODT) 4 MG disintegrating tablet DISSOLVE 1 TABLET(4 MG) ON THE TONGUE EVERY 8 HOURS AS NEEDED FOR VOMITING OR NAUSEA 03/25/24   Newlin, Enobong, MD  OZEMPIC , 0.25 OR 0.5 MG/DOSE, 2 MG/3ML SOPN INJECT 0.5MG  UNDER THE SKIN ONCE A WEEK 02/03/24   Newlin, Enobong, MD  PARoxetine  (PAXIL ) 10 MG tablet Take 1 tablet (10 mg total) by mouth daily. HOT FLASHES 01/12/24   Fleming, Zelda W, NP   propranolol  (INDERAL ) 20 MG tablet Take 1 tablet (20 mg total) by mouth 2 (two) times daily. 02/23/24   Fleming, Zelda W, NP  rosuvastatin  (CRESTOR ) 10 MG tablet Take 1 tablet (10 mg total) by mouth daily. 02/23/24   Fleming, Zelda W, NP  zonisamide (ZONEGRAN) 50 MG capsule Take 150 mg by mouth daily. 01/30/24   [provider]      Allergies    Nsaids    Review of Systems   Review of Systems  Constitutional:  Negative for chills, fatigue and fever.  Respiratory:  Negative for cough, chest tightness, shortness of breath and wheezing.   Cardiovascular:  Negative for chest pain and palpitations.  Gastrointestinal:  Negative for abdominal pain, constipation, diarrhea, nausea and vomiting.  Neurological:  Negative for dizziness, seizures, weakness, light-headedness, numbness and headaches.    Physical Exam Updated Vital Signs BP (!) 145/80 (BP Location: Right Arm)   Pulse 65   Temp 98 F (36.7 C) (Oral)   Resp 18   Ht 5' 4.5" (1.638 m)   Wt 82.1 kg   LMP 10/24/2021   SpO2 97%   BMI 30.59 kg/m  Physical Exam Vitals and nursing note reviewed.  Constitutional:      General: She is not in acute distress.    Appearance: Normal appearance.  HENT:     Head: Normocephalic and atraumatic.  Eyes:     Conjunctiva/sclera: Conjunctivae normal.  Cardiovascular:     Rate and Rhythm: Normal rate.  Pulmonary:     Effort: Pulmonary effort is normal. No respiratory distress.  Skin:    Coloration: Skin is not jaundiced or pale.     Comments: Multiple small abrasions to left forearm with 1 puncture wound.  Multiple small abrasions with 1 puncture wound to left calf.  No drainage nor active hemorrhage.  Motor 5/5 of LUE and LLE.  Sensation 2/2 of LUE and LLE.  No bony tenderness of left forearm nor left calf.  Radial and DP 2+  Neurological:     Mental Status: She is alert. Mental status is at baseline.     ED Results / Procedures / Treatments   Labs (all labs ordered are listed,  but only abnormal results are displayed) Labs Reviewed - No data to display  EKG None  Radiology No results found.  Procedures Procedures    Medications Ordered in ED Medications  amoxicillin-clavulanate (AUGMENTIN) 875-125 MG per tablet 1 tablet (1 tablet Oral Given 03/25/24 2037)    ED Course/ Medical Decision Making/ A&P                                 Medical Decision Making Risk Prescription drug management.   Patient  presents to the ED for concern of animal bite, this involves an extensive number of treatment options, and is a complaint that carries with it a high risk of complications and morbidity.  The differential diagnosis includes open fracture, cellulitis, infection, laceration, contusion, vascular injury   Co morbidities that complicate the patient evaluation  None   Additional history obtained:  Additional history obtained from Nursing   External records from outside source obtained and reviewed including triage RN note     Medicines ordered and prescription drug management:  I ordered medication including Augmentin for dog bite I have reviewed the patients home medicines and have made adjustments as needed     Problem List / ED Course:  Dog bite Patient is neurologically intact with no motor nor sensory deficits. Her dog is up-to-date on rabies vaccine. Patient is up-to-date on tetanus vaccine so does not need to be updated Show decision making is had with patient regarding obtaining x-ray imaging.  It does not appear that there is any foreign body.  There is no bony tenderness.  Wounds appear to be superficial in nature.  I think that it is reasonable to not obtain x-ray imaging as patient does not wish to do toe at this time Will provide Augmentin prescription for dog bite.  Did not repair lacerations as they are small and need to keep open to drain Discussed return precautions to include worsening symptoms, vascular injury, motor or  sensory deficits.  These are also provided on discharge instructions.  Discussed antibiotic stewardship.  Patient expresses understanding and agrees to these.   Reevaluation:  After the interventions noted above, I reevaluated the patient and found that they have :improved    Dispostion:  After consideration of the diagnostic results and the patients response to treatment, I feel that the patent would benefit from outpatient management with oral antibiotics.   Discussed ED workup, disposition, return to ED precautions with patient who expresses understanding agrees with plan.  All questions answered to their satisfaction.  They are agreeable to plan.  Discharge instructions provided on paperwork  Final Clinical Impression(s) / ED Diagnoses Final diagnoses:  Dog bite, initial encounter    Rx / DC Orders ED Discharge Orders          Ordered    amoxicillin-clavulanate (AUGMENTIN) 875-125 MG tablet  Every 12 hours        03/25/24 2023              Royann Cords, PA 03/25/24 2332    Tegeler, Marine Sia, MD 03/26/24 [phone]

## 2024-03-25 NOTE — ED Notes (Signed)
 Animal Control called at this time. Made aware it was the patient dog. Vaccs are up to date.

## 2024-03-25 NOTE — Discharge Instructions (Addendum)
 Thank you for letting us  evaluate you today.  We have treated you for dog bite with augmentin.  Please keep wounds clean and dry.  Please take entire course of antibiotics even if you are feeling better.  Please follow-up with PCP if you continue to have symptoms.  Return to ED if worsening symptoms, redness streaking up or down extremity, loss of sensation in extremities, pale cool extremity to indicate poor perfusion or vascular injury

## 2024-03-25 NOTE — ED Notes (Signed)
 Wound clean and dressing applied to both left leg and left arm injury.

## 2024-03-25 NOTE — ED Notes (Signed)
 Pt reports vaccinations are up to date for her dog.Pt reports she cleaned the wounds on her left arm and left leg.

## 2024-03-25 NOTE — ED Triage Notes (Signed)
 Pt POV steady gait- pt c/o dog bite to LLE and L forearm. Was pts dog, UTD on shots. Denies blood thinners.

## 2024-03-26 ENCOUNTER — Other Ambulatory Visit: Payer: Self-pay | Admitting: Gastroenterology

## 2024-03-26 DIAGNOSIS — K7460 Unspecified cirrhosis of liver: Secondary | ICD-10-CM

## 2024-03-28 ENCOUNTER — Ambulatory Visit
Admission: RE | Admit: 2024-03-28 | Discharge: 2024-03-28 | Disposition: A | Discharge status: Home / Self Care | Source: Ambulatory Visit | Attending: Surgical

## 2024-03-28 DIAGNOSIS — R200 Anesthesia of skin: Secondary | ICD-10-CM

## 2024-03-28 DIAGNOSIS — D1809 Hemangioma of other sites: Secondary | ICD-10-CM | POA: Diagnosis not present

## 2024-03-28 MED ORDER — GADOPICLENOL 0.5 MMOL/ML IV SOLN
9.0000 mL | Freq: Once | INTRAVENOUS | Status: AC | PRN
  Administered 2024-03-28: 9 mL via INTRAVENOUS

## 2024-04-01 DIAGNOSIS — R531 Weakness: Secondary | ICD-10-CM | POA: Diagnosis not present

## 2024-04-01 DIAGNOSIS — G540 Brachial plexus disorders: Secondary | ICD-10-CM | POA: Diagnosis not present

## 2024-04-02 DIAGNOSIS — G540 Brachial plexus disorders: Secondary | ICD-10-CM | POA: Diagnosis not present

## 2024-04-02 DIAGNOSIS — R531 Weakness: Secondary | ICD-10-CM | POA: Diagnosis not present

## 2024-04-07 DIAGNOSIS — G540 Brachial plexus disorders: Secondary | ICD-10-CM | POA: Diagnosis not present

## 2024-04-07 DIAGNOSIS — R531 Weakness: Secondary | ICD-10-CM | POA: Diagnosis not present

## 2024-04-08 DIAGNOSIS — G540 Brachial plexus disorders: Secondary | ICD-10-CM | POA: Diagnosis not present

## 2024-04-08 DIAGNOSIS — R531 Weakness: Secondary | ICD-10-CM | POA: Diagnosis not present

## 2024-04-12 ENCOUNTER — Ambulatory Visit
Admission: RE | Admit: 2024-04-12 | Discharge: 2024-04-12 | Disposition: A | Discharge status: Home / Self Care | Source: Ambulatory Visit | Attending: Gastroenterology | Admitting: Gastroenterology

## 2024-04-12 DIAGNOSIS — K8020 Calculus of gallbladder without cholecystitis without obstruction: Secondary | ICD-10-CM | POA: Diagnosis not present

## 2024-04-12 DIAGNOSIS — K7460 Unspecified cirrhosis of liver: Secondary | ICD-10-CM | POA: Diagnosis not present

## 2024-04-12 DIAGNOSIS — R161 Splenomegaly, not elsewhere classified: Secondary | ICD-10-CM | POA: Diagnosis not present

## 2024-04-14 DIAGNOSIS — R531 Weakness: Secondary | ICD-10-CM | POA: Diagnosis not present

## 2024-04-14 DIAGNOSIS — G540 Brachial plexus disorders: Secondary | ICD-10-CM | POA: Diagnosis not present

## 2024-04-15 ENCOUNTER — Other Ambulatory Visit: Payer: Self-pay

## 2024-04-15 ENCOUNTER — Encounter: Payer: Self-pay | Admitting: Nurse Practitioner

## 2024-04-15 DIAGNOSIS — R232 Flushing: Secondary | ICD-10-CM

## 2024-04-15 DIAGNOSIS — G540 Brachial plexus disorders: Secondary | ICD-10-CM | POA: Diagnosis not present

## 2024-04-15 DIAGNOSIS — R531 Weakness: Secondary | ICD-10-CM | POA: Diagnosis not present

## 2024-04-15 MED ORDER — PAROXETINE HCL 10 MG PO TABS: 10.0000 mg | ORAL_TABLET | Freq: Every day | ORAL | 0 refills | Status: DC

## 2024-04-16 ENCOUNTER — Other Ambulatory Visit: Payer: Self-pay | Admitting: Family Medicine

## 2024-04-16 DIAGNOSIS — K219 Gastro-esophageal reflux disease without esophagitis: Secondary | ICD-10-CM

## 2024-04-16 NOTE — Telephone Encounter (Signed)
 Requested medication (s) are due for refill today: yes  Requested medication (s) are on the active medication list: yes  Last refill:  03/25/24  Future visit scheduled: yes  Notes to clinic:  Unable to refill per protocol, cannot delegate.      Requested Prescriptions  Pending Prescriptions Disp Refills   ondansetron  (ZOFRAN -ODT) 4 MG disintegrating tablet [Pharmacy Med Name: ONDANSETRON  ODT 4MG  TABLETS] 30 tablet 1    Sig: DISSOLVE 1 TABLET(4 MG) ON THE TONGUE EVERY 8 HOURS AS NEEDED FOR VOMITING OR NAUSEA     Not Delegated - Gastroenterology: Antiemetics - ondansetron  Failed - 04/16/2024  9:19 AM      Failed - This refill cannot be delegated      Passed - AST in normal range and within 360 days    AST  Date Value Ref Range Status  03/09/2024 26 0 - 40 IU/L Final         Passed - ALT in normal range and within 360 days    ALT  Date Value Ref Range Status  03/09/2024 24 0 - 32 IU/L Final         Passed - Valid encounter within last 6 months    Recent Outpatient Visits           1 month ago Primary hypertension   [facility] [place]. [facility] [place], [place] W, NP   4 months ago Type 2 diabetes mellitus with unspecified complications [facility])   [facility] [place]. [facility] Collins Dean, NP   7 months ago Primary hypertension   [facility] Dept Of [place]. [facility] Collins Dean, NP   10 months ago Encounter for annual physical exam   [facility] Of [place]. [facility] Collins Dean, NP   1 year ago Primary hypertension   [facility] Dept Of [place]. [facility] Collins Dean, [place]

## 2024-04-22 DIAGNOSIS — M542 Cervicalgia: Secondary | ICD-10-CM | POA: Diagnosis not present

## 2024-04-22 DIAGNOSIS — G43719 Chronic migraine without aura, intractable, without status migrainosus: Secondary | ICD-10-CM | POA: Diagnosis not present

## 2024-04-22 DIAGNOSIS — G540 Brachial plexus disorders: Secondary | ICD-10-CM | POA: Diagnosis not present

## 2024-04-22 DIAGNOSIS — R531 Weakness: Secondary | ICD-10-CM | POA: Diagnosis not present

## 2024-04-23 DIAGNOSIS — G540 Brachial plexus disorders: Secondary | ICD-10-CM | POA: Diagnosis not present

## 2024-04-23 DIAGNOSIS — R531 Weakness: Secondary | ICD-10-CM | POA: Diagnosis not present

## 2024-04-26 NOTE — Telephone Encounter (Signed)
 I called patient.  MRI was negative aside from the cervical rib that we saw on the x-rays.  Her symptoms are improving with PT however.  Will keep this going and see her back in 2 months for clinical recheck.  I told her to make an appointment with the office for 2 months.

## 2024-04-27 NOTE — Telephone Encounter (Signed)
 noted

## 2024-04-29 DIAGNOSIS — R531 Weakness: Secondary | ICD-10-CM | POA: Diagnosis not present

## 2024-04-29 DIAGNOSIS — G540 Brachial plexus disorders: Secondary | ICD-10-CM | POA: Diagnosis not present

## 2024-04-30 DIAGNOSIS — G540 Brachial plexus disorders: Secondary | ICD-10-CM | POA: Diagnosis not present

## 2024-04-30 DIAGNOSIS — R531 Weakness: Secondary | ICD-10-CM | POA: Diagnosis not present

## 2024-05-09 ENCOUNTER — Other Ambulatory Visit: Payer: Self-pay | Admitting: Family Medicine

## 2024-05-09 DIAGNOSIS — K219 Gastro-esophageal reflux disease without esophagitis: Secondary | ICD-10-CM

## 2024-05-15 ENCOUNTER — Encounter: Payer: Self-pay | Admitting: Nurse Practitioner

## 2024-05-16 ENCOUNTER — Other Ambulatory Visit: Payer: Self-pay | Admitting: Nurse Practitioner

## 2024-05-16 DIAGNOSIS — E118 Type 2 diabetes mellitus with unspecified complications: Secondary | ICD-10-CM

## 2024-05-16 MED ORDER — METFORMIN HCL 500 MG PO TABS: 500.0000 mg | ORAL_TABLET | Freq: Two times a day (BID) | ORAL | 1 refills | Status: DC

## 2024-06-02 ENCOUNTER — Other Ambulatory Visit: Payer: Self-pay | Admitting: Medical Genetics

## 2024-06-08 ENCOUNTER — Telehealth: Payer: Self-pay | Admitting: Nurse Practitioner

## 2024-06-08 NOTE — Telephone Encounter (Signed)
 Pt confirmed appt 8/5

## 2024-06-09 ENCOUNTER — Ambulatory Visit: Attending: Nurse Practitioner | Admitting: Nurse Practitioner

## 2024-06-09 ENCOUNTER — Encounter: Payer: Self-pay | Admitting: Nurse Practitioner

## 2024-06-09 VITALS — BP 127/83 | Resp 20 | Ht 64.5 in | Wt 180.0 lb

## 2024-06-09 DIAGNOSIS — Z7984 Long term (current) use of oral hypoglycemic drugs: Secondary | ICD-10-CM

## 2024-06-09 DIAGNOSIS — R202 Paresthesia of skin: Secondary | ICD-10-CM

## 2024-06-09 DIAGNOSIS — E118 Type 2 diabetes mellitus with unspecified complications: Secondary | ICD-10-CM | POA: Diagnosis not present

## 2024-06-09 DIAGNOSIS — I10 Essential (primary) hypertension: Secondary | ICD-10-CM

## 2024-06-09 DIAGNOSIS — Z7985 Long-term (current) use of injectable non-insulin antidiabetic drugs: Secondary | ICD-10-CM

## 2024-06-09 DIAGNOSIS — N951 Menopausal and female climacteric states: Secondary | ICD-10-CM

## 2024-06-09 LAB — POCT GLYCOSYLATED HEMOGLOBIN (HGB A1C): HbA1c, POC (controlled diabetic range): 5.3 % (ref 0.0–7.0)

## 2024-06-09 MED ORDER — VEOZAH 45 MG PO TABS: 45.0000 mg | ORAL_TABLET | Freq: Every day | ORAL | 1 refills | Status: DC

## 2024-06-09 NOTE — Progress Notes (Unsigned)
 Assessment & Plan:  Angela Castillo was seen today for diabetes.  Diagnoses and all orders for this visit:  Type 2 diabetes mellitus with unspecified complications (HCC) -     POCT glycosylated hemoglobin (Hb A1C)  Primary hypertension  Paresthesia -     Vitamin B12 -     Thyroid Panel With TSH    Patient has been counseled on age-appropriate routine health concerns for screening and prevention. These are reviewed and up-to-date. Referrals have been placed accordingly. Immunizations are up-to-date or declined.    Subjective:   Chief Complaint  Patient presents with   Diabetes    Angela Castillo 48 y.o. female presents to office today for follow up to DM and HTN  Lab Results  Component Value Date   HGBA1C 5.3 06/09/2024     BP Readings from Last 3 Encounters:  06/09/24 127/83  03/25/24 (!) 145/80  03/09/24 121/76      ROS  Past Medical History:  Diagnosis Date   Anemia Dx January 03 2015   Arthritis Aug 04 2002   Cant fully remember dates   Diabetes mellitus without complication (HCC)    Elevated liver enzymes    Essential hypertension Dx Apr 2016   Fatty liver    GERD (gastroesophageal reflux disease)    Again cant remember date   H/O: upper GI bleed 03/2019    variceal bleed    Headache    History of blood transfusion 03/2019   Liver cirrhosis secondary to NASH (HCC)    Neuropathy    Pneumonia 03/2019   Reflux    Thrombocytopenia (HCC)     Past Surgical History:  Procedure Laterality Date   DILITATION & CURRETTAGE/HYSTROSCOPY WITH HYDROTHERMAL ABLATION N/A 11/20/2021   Procedure: DILATATION & CURETTAGE/HYSTEROSCOPY WITH HYDROTHERMAL ABLATION;  Surgeon: Fredirick Glenys RAMAN, MD;  Location: [facility] OR;  Service: Gynecology;  Laterality: N/A;  rep will be here confirmed on 11/12/21 CS   ESOPHAGEAL BANDING  03/22/2019   Procedure: ESOPHAGEAL BANDING;  Surgeon: Dianna Specking, MD;  Location: [facility];  Service: Endoscopy;;   ESOPHAGEAL BANDING  03/23/2019    Procedure: ESOPHAGEAL BANDING;  Surgeon: Dianna Specking, MD;  Location: [facility];  Service: Endoscopy;;   ESOPHAGEAL BANDING  09/20/2019   Procedure: ESOPHAGEAL BANDING;  Surgeon: Dianna Specking, MD;  Location: [facility];  Service: Endoscopy;;   ESOPHAGEAL BANDING N/A 12/21/2019   Procedure: ESOPHAGEAL BANDING;  Surgeon: Dianna Specking, MD;  Location: [facility];  Service: Endoscopy;  Laterality: N/A;   ESOPHAGOGASTRODUODENOSCOPY N/A 03/22/2019   Procedure: ESOPHAGOGASTRODUODENOSCOPY (EGD);  Surgeon: Dianna Specking, MD;  Location: [facility];  Service: Endoscopy;  Laterality: N/A;   ESOPHAGOGASTRODUODENOSCOPY N/A 03/23/2019   Procedure: ESOPHAGOGASTRODUODENOSCOPY (EGD);  Surgeon: Dianna Specking, MD;  Location: [facility];  Service: Endoscopy;  Laterality: N/A;   ESOPHAGOGASTRODUODENOSCOPY (EGD) WITH PROPOFOL  N/A 07/23/2019   Procedure: ESOPHAGOGASTRODUODENOSCOPY (EGD) WITH PROPOFOL  with possible banding;  Surgeon: Dianna Specking, MD;  Location: [facility];  Service: Endoscopy;  Laterality: N/A;   ESOPHAGOGASTRODUODENOSCOPY (EGD) WITH PROPOFOL  N/A 09/20/2019   Procedure: ESOPHAGOGASTRODUODENOSCOPY (EGD) WITH PROPOFOL ;  Surgeon: Dianna Specking, MD;  Location: [facility];  Service: Endoscopy;  Laterality: N/A;   ESOPHAGOGASTRODUODENOSCOPY (EGD) WITH PROPOFOL  N/A 12/21/2019   Procedure: ESOPHAGOGASTRODUODENOSCOPY (EGD) WITH PROPOFOL  with possible banding;  Surgeon: Dianna Specking, MD;  Location: [facility];  Service: Endoscopy;  Laterality: N/A;   HERNIA REPAIR     TUBAL LIGATION  07/25/2000   VENTRAL HERNIA REPAIR N/A 07/06/2020   Procedure: VENTRAL HERNIA REPAIR WITH  MESH;  Surgeon: Vernetta Berg, MD;  [facility] OR;  Service: General;  Laterality: N/A;    Family History  Problem Relation Age of Onset   Diabetes Father    COPD Mother    Esophageal cancer Mother    Diabetes Mother    Cancer Mother        esophageal   Hypertension Mother     Breast cancer Maternal Aunt    Cancer Maternal Grandmother    ADD / ADHD Son    ADD / ADHD Son    Cancer Maternal Aunt     Social History Reviewed with no changes to be made today.   Outpatient Medications Prior to Visit  Medication Sig Dispense Refill   Accu-Chek Softclix Lancets lancets USE TO CHECK BLOOD SUGAR THREE TIMES DAILY 300 each 0   acetaminophen  (TYLENOL ) 500 MG tablet Take 500-1,000 mg by mouth every 6 (six) hours as needed for mild pain.     albuterol  (VENTOLIN  HFA) 108 (90 Base) MCG/ACT inhaler Inhale 2 puffs into the lungs every 6 (six) hours as needed for wheezing or shortness of breath. 6.7 g 1   Biotin 89999 MCG TABS Take 10,000 mcg by mouth daily.     Blood Glucose Monitoring Suppl (ACCU-CHEK GUIDE) w/Device KIT Use to check blood sugar three times daily. 1 kit 0   dapagliflozin  propanediol (FARXIGA ) 5 MG TABS tablet Take 1 tablet (5 mg total) by mouth daily. 90 tablet 1   diclofenac  Sodium (VOLTAREN ) 1 % GEL Apply 4 g topically 4 (four) times daily. 200 g 1   esomeprazole  (NEXIUM ) 40 MG capsule Take 40 mg by mouth 2 (two) times daily before a meal.     ferrous sulfate  325 (65 FE) MG tablet Take 1 tablet (325 mg total) by mouth 2 (two) times daily with a meal. 180 tablet 2   fluticasone  (FLONASE ) 50 MCG/ACT nasal spray Place 2 sprays into both nostrils daily. 16 g 6   glucose blood (ACCU-CHEK GUIDE) test strip USE TO CHECK BLOOD SUGAR THREE TIMES DAILY 300 strip 0   icosapent  Ethyl (VASCEPA ) 1 g capsule Take 2 capsules (2 g total) by mouth 2 (two) times daily. 180 capsule 1   ketoconazole  (NIZORAL ) 2 % cream Apply 1 application  topically daily as needed for irritation (as needed).     loratadine  (CLARITIN ) 10 MG tablet TAKE 1 TABLET BY MOUTH EVERY DAY 90 tablet 1   losartan  (COZAAR ) 50 MG tablet Take 1 tablet (50 mg total) by mouth daily. 90 tablet 3   Menthol, Topical Analgesic, (ICY HOT MEDICATED SPRAY) 16 % LIQD Apply 1 spray topically daily as needed (pain).      metFORMIN  (GLUCOPHAGE ) 500 MG tablet Take 1 tablet (500 mg total) by mouth 2 (two) times daily with a meal. 180 tablet 1   methocarbamol  (ROBAXIN ) 500 MG tablet Take 1 tablet (500 mg total) by mouth every 8 (eight) hours as needed for muscle spasms. 60 tablet 3   Microlet Lancets MISC USE UP TO FOUR TIMES DAILY AS DIRECTED 100 each 11   mupirocin  ointment (BACTROBAN ) 2 % Apply topically 2 (two) times daily. 22 g 0   ondansetron  (ZOFRAN -ODT) 4 MG disintegrating tablet DISSOLVE 1 TABLET(4 MG) ON THE TONGUE EVERY 8 HOURS AS NEEDED FOR VOMITING OR NAUSEA 30 tablet 1   OZEMPIC , 0.25 OR 0.5 MG/DOSE, 2 MG/3ML SOPN INJECT 0.5MG  UNDER THE SKIN ONCE A WEEK 9 mL 1   PARoxetine  (PAXIL ) 10 MG tablet Take 1 tablet (  10 mg total) by mouth daily. HOT FLASHES 90 tablet 0   propranolol  (INDERAL ) 20 MG tablet Take 1 tablet (20 mg total) by mouth 2 (two) times daily. 180 tablet 1   rosuvastatin  (CRESTOR ) 10 MG tablet Take 1 tablet (10 mg total) by mouth daily. 90 tablet 1   zonisamide (ZONEGRAN) 50 MG capsule Take 150 mg by mouth daily.     amoxicillin -clavulanate (AUGMENTIN ) 875-125 MG tablet Take 1 tablet by mouth every 12 (twelve) hours. (Patient not taking: Reported on 06/09/2024) 14 tablet 0   No facility-administered medications prior to visit.    Allergies  Allergen Reactions   Nsaids     GI BLEED       Objective:    BP 127/83 (BP Location: Left Arm, Patient Position: Sitting, Cuff Size: Normal)   Resp 20   Ht 5' 4.5 (1.638 m)   Wt 180 lb (81.6 kg)   LMP 10/24/2021   SpO2 100%   BMI 30.42 kg/m  Wt Readings from Last 3 Encounters:  06/09/24 180 lb (81.6 kg)  03/25/24 181 lb (82.1 kg)  03/09/24 187 lb 3.2 oz (84.9 kg)    Physical Exam       Patient has been counseled extensively about nutrition and exercise as well as the importance of adherence with medications and regular follow-up. The patient was given clear instructions to go to ER or return to medical center if symptoms don't improve,  worsen or new problems develop. The patient verbalized understanding.   Follow-up: No follow-ups on file.   Haze LELON Servant, FNP-BC [facility] [place], [place] [phone]   06/09/2024, 4:38 PM

## 2024-06-10 ENCOUNTER — Other Ambulatory Visit: Payer: Self-pay | Admitting: Nurse Practitioner

## 2024-06-10 DIAGNOSIS — R232 Flushing: Secondary | ICD-10-CM

## 2024-06-10 DIAGNOSIS — N951 Menopausal and female climacteric states: Secondary | ICD-10-CM

## 2024-06-11 ENCOUNTER — Encounter: Payer: Self-pay | Admitting: Family Medicine

## 2024-06-14 ENCOUNTER — Ambulatory Visit: Attending: Family Medicine

## 2024-06-14 ENCOUNTER — Other Ambulatory Visit (HOSPITAL_COMMUNITY)
Admission: RE | Admit: 2024-06-14 | Discharge: 2024-06-14 | Disposition: A | Discharge status: Home / Self Care | Payer: Self-pay | Source: Ambulatory Visit | Attending: Medical Genetics | Admitting: Medical Genetics

## 2024-06-14 DIAGNOSIS — R202 Paresthesia of skin: Secondary | ICD-10-CM | POA: Diagnosis not present

## 2024-06-15 ENCOUNTER — Telehealth: Payer: Self-pay

## 2024-06-15 ENCOUNTER — Telehealth: Payer: Self-pay | Admitting: Family Medicine

## 2024-06-15 LAB — THYROID PANEL WITH TSH
Free Thyroxine Index: 1.8 (ref 1.2–4.9)
T3 Uptake Ratio: 21 % — ABNORMAL LOW (ref 24–39)
T4, Total: 8.7 ug/dL (ref 4.5–12.0)
TSH: 2.65 u[IU]/mL (ref 0.450–4.500)

## 2024-06-15 LAB — VITAMIN B12: Vitamin B-12: 255 pg/mL (ref 232–1245)

## 2024-06-15 NOTE — Telephone Encounter (Signed)
 Called patient on 06/15/2024 to let her know that we scheduled her annual exam with Dr. Jeralyn on July 15, 2024 at 2:55 PM. Patient didn't answer the call so I left her a voice mail with appointment details and asked her to call us  back if she needs to reschedule/change  the appointment.

## 2024-06-15 NOTE — Telephone Encounter (Signed)
 Pharmacy Patient Advocate Encounter   Received notification from CoverMyMeds that prior authorization for VEOZAH  is required/requested.   Insurance verification completed.   The patient is insured through [facility] .   Per test claim: PA required; PA submitted to above mentioned insurance via CoverMyMeds Key/confirmation #/EOC [id number] Status is pending

## 2024-06-16 ENCOUNTER — Telehealth: Payer: Self-pay

## 2024-06-16 ENCOUNTER — Other Ambulatory Visit: Payer: Self-pay

## 2024-06-16 NOTE — Telephone Encounter (Signed)
 Pharmacy Patient Advocate Encounter  Received notification from [facility] that Prior Authorization for VEOZAH  has been APPROVED from 06/15/2024 to 06/15/2025   PA #/Case ID/Reference #: [id number]

## 2024-06-17 ENCOUNTER — Other Ambulatory Visit: Payer: Self-pay | Admitting: Nurse Practitioner

## 2024-06-17 ENCOUNTER — Encounter: Payer: Self-pay | Admitting: Nurse Practitioner

## 2024-06-17 DIAGNOSIS — R42 Dizziness and giddiness: Secondary | ICD-10-CM

## 2024-06-17 DIAGNOSIS — G43719 Chronic migraine without aura, intractable, without status migrainosus: Secondary | ICD-10-CM | POA: Diagnosis not present

## 2024-06-17 DIAGNOSIS — M542 Cervicalgia: Secondary | ICD-10-CM | POA: Diagnosis not present

## 2024-06-20 ENCOUNTER — Ambulatory Visit: Payer: Self-pay | Admitting: Nurse Practitioner

## 2024-06-25 LAB — GENECONNECT MOLECULAR SCREEN: Genetic Analysis Overall Interpretation: NEGATIVE

## 2024-06-26 MED ORDER — MECLIZINE HCL 25 MG PO TABS
25.0000 mg | ORAL_TABLET | Freq: Two times a day (BID) | ORAL | 0 refills | Status: AC
Start: 2024-06-26 — End: ?

## 2024-06-29 ENCOUNTER — Other Ambulatory Visit: Payer: Self-pay | Admitting: Nurse Practitioner

## 2024-06-29 DIAGNOSIS — Z1231 Encounter for screening mammogram for malignant neoplasm of breast: Secondary | ICD-10-CM

## 2024-07-01 ENCOUNTER — Other Ambulatory Visit: Payer: Self-pay | Admitting: *Deleted

## 2024-07-01 ENCOUNTER — Inpatient Hospital Stay: Payer: BC Managed Care – PPO | Attending: Hematology and Oncology

## 2024-07-01 ENCOUNTER — Inpatient Hospital Stay (HOSPITAL_BASED_OUTPATIENT_CLINIC_OR_DEPARTMENT_OTHER): Payer: BC Managed Care – PPO | Admitting: Hematology and Oncology

## 2024-07-01 VITALS — BP 108/76 | HR 66 | Temp 98.0°F | Resp 18 | Ht 60.5 in | Wt 186.6 lb

## 2024-07-01 DIAGNOSIS — J84112 Idiopathic pulmonary fibrosis: Secondary | ICD-10-CM | POA: Insufficient documentation

## 2024-07-01 DIAGNOSIS — K7460 Unspecified cirrhosis of liver: Secondary | ICD-10-CM | POA: Insufficient documentation

## 2024-07-01 DIAGNOSIS — D696 Thrombocytopenia, unspecified: Secondary | ICD-10-CM | POA: Diagnosis not present

## 2024-07-01 DIAGNOSIS — Z79899 Other long term (current) drug therapy: Secondary | ICD-10-CM | POA: Insufficient documentation

## 2024-07-01 DIAGNOSIS — Z886 Allergy status to analgesic agent status: Secondary | ICD-10-CM | POA: Diagnosis not present

## 2024-07-01 DIAGNOSIS — E611 Iron deficiency: Secondary | ICD-10-CM | POA: Insufficient documentation

## 2024-07-01 DIAGNOSIS — Z8701 Personal history of pneumonia (recurrent): Secondary | ICD-10-CM | POA: Diagnosis not present

## 2024-07-01 LAB — CBC WITH DIFFERENTIAL (CANCER CENTER ONLY)
Abs Immature Granulocytes: 0.01 K/uL (ref 0.00–0.07)
Basophils Absolute: 0 K/uL (ref 0.0–0.1)
Basophils Relative: 0 %
Eosinophils Absolute: 0.1 K/uL (ref 0.0–0.5)
Eosinophils Relative: 2 %
HCT: 39.5 % (ref 36.0–46.0)
Hemoglobin: 12.6 g/dL (ref 12.0–15.0)
Immature Granulocytes: 0 %
Lymphocytes Relative: 23 %
Lymphs Abs: 1 K/uL (ref 0.7–4.0)
MCH: 29 pg (ref 26.0–34.0)
MCHC: 31.9 g/dL (ref 30.0–36.0)
MCV: 91 fL (ref 80.0–100.0)
Monocytes Absolute: 0.3 K/uL (ref 0.1–1.0)
Monocytes Relative: 8 %
Neutro Abs: 2.8 K/uL (ref 1.7–7.7)
Neutrophils Relative %: 67 %
Platelet Count: 63 K/uL — ABNORMAL LOW (ref 150–400)
RBC: 4.34 MIL/uL (ref 3.87–5.11)
RDW: 13.2 % (ref 11.5–15.5)
WBC Count: 4.2 K/uL (ref 4.0–10.5)
nRBC: 0 % (ref 0.0–0.2)

## 2024-07-01 LAB — IMMATURE PLATELET FRACTION: Immature Platelet Fraction: 6.6 % (ref 1.2–8.6)

## 2024-07-01 NOTE — Assessment & Plan Note (Signed)
 Originally started August 2015: Platelet count 130 01/03/2015: Hemoglobin 6.4: Required blood transfusion and was found to be iron deficient 05/01/2017: Platelet count relatively stable 147 September 2019: Platelet count 110 03/21/2019: Platelet count 94 during hospitalization for cirrhosis and gastric variceal bleeding status post banding complicated by aspiration pneumonia 06/13/2020: Platelets 107 06/27/2021: Platelets 100 12/06/2022: Platelets 66 (possibility that there may be clumping) 12/31/2022: Platelets 81, immature platelet fraction 9.4% (suggests good Bone marrow production versus ITP) 07/01/2023: Platelets 83, IPF: 6.5%, 07/01/2024:   Differential diagnosis: Cirrhosis and splenomegaly related thrombocytopenia versus ITP Previous work-up has been negative hepatitis B, hepatitis C, HIV   Continue watchful monitoring with a follow-up in 1 year with labs

## 2024-07-01 NOTE — Progress Notes (Unsigned)
 Patient Care Team: Theotis Haze ORN, NP as PCP - General (Nurse Practitioner)  DIAGNOSIS:  Encounter Diagnosis  Name Primary?   Thrombocytopenia (HCC) Yes    CHIEF COMPLIANT: F/U of thrombocytopenia  HISTORY OF PRESENT ILLNESS: Angela Castillo is a 48 year old who is here for follow up of thrombocytopenia. She's doing well and without any bleeding symptoms    ALLERGIES:  is allergic to nsaids.  MEDICATIONS:  Current Outpatient Medications  Medication Sig Dispense Refill   acetaminophen  (TYLENOL ) 500 MG tablet Take 500-1,000 mg by mouth every 6 (six) hours as needed for mild pain.     albuterol  (VENTOLIN  HFA) 108 (90 Base) MCG/ACT inhaler Inhale 2 puffs into the lungs every 6 (six) hours as needed for wheezing or shortness of breath. 6.7 g 1   Biotin 89999 MCG TABS Take 10,000 mcg by mouth daily.     Blood Glucose Monitoring Suppl (ACCU-CHEK GUIDE) w/Device KIT Use to check blood sugar three times daily. 1 kit 0   dapagliflozin  propanediol (FARXIGA ) 5 MG TABS tablet Take 1 tablet (5 mg total) by mouth daily. 90 tablet 1   esomeprazole  (NEXIUM ) 40 MG capsule Take 40 mg by mouth 2 (two) times daily before a meal.     ferrous sulfate  325 (65 FE) MG tablet Take 1 tablet (325 mg total) by mouth 2 (two) times daily with a meal. 180 tablet 2   Fezolinetant  (VEOZAH ) 45 MG TABS Take 1 tablet (45 mg total) by mouth daily. 30 tablet 1   glucose blood (ACCU-CHEK GUIDE) test strip USE TO CHECK BLOOD SUGAR THREE TIMES DAILY 300 strip 0   icosapent  Ethyl (VASCEPA ) 1 g capsule Take 2 capsules (2 g total) by mouth 2 (two) times daily. 180 capsule 1   ketoconazole  (NIZORAL ) 2 % cream Apply 1 application  topically daily as needed for irritation (as needed).     losartan  (COZAAR ) 50 MG tablet Take 1 tablet (50 mg total) by mouth daily. 90 tablet 3   Menthol, Topical Analgesic, (ICY HOT MEDICATED SPRAY) 16 % LIQD Apply 1 spray topically daily as needed (pain).     metFORMIN  (GLUCOPHAGE ) 500 MG tablet Take 1  tablet (500 mg total) by mouth 2 (two) times daily with a meal. 180 tablet 1   methocarbamol  (ROBAXIN ) 500 MG tablet Take 1 tablet (500 mg total) by mouth every 8 (eight) hours as needed for muscle spasms. 60 tablet 3   mupirocin  ointment (BACTROBAN ) 2 % Apply topically 2 (two) times daily. 22 g 0   OZEMPIC , 0.25 OR 0.5 MG/DOSE, 2 MG/3ML SOPN INJECT 0.5MG  UNDER THE SKIN ONCE A WEEK 9 mL 1   propranolol  (INDERAL ) 20 MG tablet Take 1 tablet (20 mg total) by mouth 2 (two) times daily. 180 tablet 1   rosuvastatin  (CRESTOR ) 10 MG tablet Take 1 tablet (10 mg total) by mouth daily. 90 tablet 1   zonisamide (ZONEGRAN) 50 MG capsule Take 150 mg by mouth daily.     Accu-Chek Softclix Lancets lancets USE TO CHECK BLOOD SUGAR THREE TIMES DAILY 300 each 0   diclofenac  Sodium (VOLTAREN ) 1 % GEL Apply 4 g topically 4 (four) times daily. (Patient not taking: Reported on 07/01/2024) 200 g 1   fluticasone  (FLONASE ) 50 MCG/ACT nasal spray Place 2 sprays into both nostrils daily. (Patient not taking: Reported on 07/01/2024) 16 g 6   meclizine  (ANTIVERT ) 25 MG tablet Take 1-2 tablets (25-50 mg total) by mouth 2 (two) times daily. (Patient not taking: Reported on 07/01/2024)  60 tablet 0   Microlet Lancets MISC USE UP TO FOUR TIMES DAILY AS DIRECTED (Patient not taking: Reported on 07/01/2024) 100 each 11   ondansetron  (ZOFRAN -ODT) 4 MG disintegrating tablet DISSOLVE 1 TABLET(4 MG) ON THE TONGUE EVERY 8 HOURS AS NEEDED FOR VOMITING OR NAUSEA (Patient not taking: Reported on 07/01/2024) 30 tablet 1   No current facility-administered medications for this visit.    PHYSICAL EXAMINATION: ECOG PERFORMANCE STATUS: 1 - Symptomatic but completely ambulatory  Vitals:   07/01/24 1526  BP: 108/76  Pulse: 66  Resp: 18  Temp: 98 F (36.7 C)  SpO2: 100%   Filed Weights   07/01/24 1526  Weight: 186 lb 9.6 oz (84.6 kg)     LABORATORY DATA:  I have reviewed the data as listed    Latest Ref Rng & Units 03/09/2024    4:52 PM  09/09/2023    4:49 PM 07/24/2023    8:39 PM  CMP  Glucose 70 - 99 mg/dL 81  79  889   BUN 6 - 24 mg/dL 10  14  11    Creatinine 0.57 - 1.00 mg/dL 9.30  9.35  9.26   Sodium 134 - 144 mmol/L 142  142  144   Potassium 3.5 - 5.2 mmol/L 4.1  4.2  3.4   Chloride 96 - 106 mmol/L 104  106  102   CO2 20 - 29 mmol/L 24  24  26    Calcium  8.7 - 10.2 mg/dL 9.8  89.9  9.5   Total Protein 6.0 - 8.5 g/dL 7.6  7.8    Total Bilirubin 0.0 - 1.2 mg/dL 0.5  0.7    Alkaline Phos 44 - 121 IU/L 123  103    AST 0 - 40 IU/L 26  23    ALT 0 - 32 IU/L 24  24      Lab Results  Component Value Date   WBC 4.2 07/01/2024   HGB 12.6 07/01/2024   HCT 39.5 07/01/2024   MCV 91.0 07/01/2024   PLT 63 (L) 07/01/2024   NEUTROABS 2.8 07/01/2024    ASSESSMENT & PLAN:  Thrombocytopenia (HCC) Originally started August 2015: Platelet count 130 01/03/2015: Hemoglobin 6.4: Required blood transfusion and was found to be iron deficient 05/01/2017: Platelet count relatively stable 147 September 2019: Platelet count 110 03/21/2019: Platelet count 94 during hospitalization for cirrhosis and gastric variceal bleeding status post banding complicated by aspiration pneumonia 06/13/2020: Platelets 107 06/27/2021: Platelets 100 12/06/2022: Platelets 66 (possibility that there may be clumping) 12/31/2022: Platelets 81, immature platelet fraction 9.4% (suggests good Bone marrow production versus ITP) 07/01/2023: Platelets 83, IPF: 6.5%, 07/01/2024: Platelets 68   Differential diagnosis: Cirrhosis and splenomegaly related thrombocytopenia versus ITP Previous work-up has been negative hepatitis B, hepatitis C, HIV   Continue watchful monitoring with a follow-up in 1 year with labs     Orders Placed This Encounter  Procedures   CBC with Differential (Cancer Center Only)    Standing Status:   Future    Expiration Date:   07/01/2025   CMP (Cancer Center only)    Standing Status:   Future    Expiration Date:   07/01/2025   Immature  Platelet Fraction    Standing Status:   Future    Expiration Date:   07/01/2025   The patient has a good understanding of the overall plan. she agrees with it. she will call with any problems that may develop before the next visit here. Total time spent: 30  mins including face to face time and time spent for planning, charting and co-ordination of care   Viinay K Brogan England, MD 07/02/24

## 2024-07-15 ENCOUNTER — Encounter: Payer: Self-pay | Admitting: Obstetrics and Gynecology

## 2024-07-15 ENCOUNTER — Encounter: Payer: Self-pay | Admitting: Nurse Practitioner

## 2024-07-15 ENCOUNTER — Other Ambulatory Visit: Payer: Self-pay | Admitting: Nurse Practitioner

## 2024-07-15 ENCOUNTER — Ambulatory Visit (INDEPENDENT_AMBULATORY_CARE_PROVIDER_SITE_OTHER): Admitting: Obstetrics and Gynecology

## 2024-07-15 VITALS — BP 130/89 | HR 60 | Wt 183.4 lb

## 2024-07-15 DIAGNOSIS — Z01419 Encounter for gynecological examination (general) (routine) without abnormal findings: Secondary | ICD-10-CM

## 2024-07-15 DIAGNOSIS — Z1331 Encounter for screening for depression: Secondary | ICD-10-CM | POA: Diagnosis not present

## 2024-07-15 DIAGNOSIS — N951 Menopausal and female climacteric states: Secondary | ICD-10-CM

## 2024-07-15 DIAGNOSIS — M2550 Pain in unspecified joint: Secondary | ICD-10-CM

## 2024-07-15 DIAGNOSIS — R232 Flushing: Secondary | ICD-10-CM

## 2024-07-15 MED ORDER — METHOCARBAMOL 500 MG PO TABS: 500.0000 mg | ORAL_TABLET | Freq: Three times a day (TID) | ORAL | 3 refills | Status: AC | PRN

## 2024-07-15 NOTE — Progress Notes (Signed)
 NEW GYNECOLOGY PATIENT Patient name: Tenlee Wollin MRN [id number]  Date of birth: 1976/07/13 Chief Complaint:   Gynecologic Exam     History:  Discussed the use of AI scribe software for clinical note transcription with the patient, who gave verbal consent to proceed.  History of Present Illness Dystany Anne Buttermore is a 48 year old female who presents with a history of vertigo and unusual sensations.  She experiences intermittent sensations described as 'a bunch of bugs or pins and needles' biting her all at once, primarily affecting areas around her back. These episodes are brief and not associated with any visible skin changes.  Approximately one to two weeks after the onset of these sensations, she experienced vertigo lasting five days. The vertigo began on a Wednesday and progressively lessened, resolving by Sunday. During this period, she felt dizzy and heavy but did not experience nausea or other symptoms. She reports that Dr. Theotis prescribed metolazone for dizziness, which she found effective but it caused sleepiness, so she avoided taking it during work hours.  She has a history of hot flashes managed with Feso. She has not had a period since undergoing an ablation and reports no abnormal vaginal discharge or bleeding. She is not currently sexually active and her last Pap smear was in 2023.  She was transitioning from Paxil  to Veozah  around the time of these symptoms but does not believe the symptoms were related to Paxil  withdrawal. No abnormal vaginal discharge, bleeding, or nausea during the vertigo episodes.       Gynecologic History Patient's last menstrual period was 10/24/2021. Contraception: post menopausal status   OB History  Gravida Para Term Preterm AB Living  2 2 2  0 0 2  SAB IAB Ectopic Multiple Live Births  0 0 0 0 2    # Outcome Date GA Lbr Len/2nd Weight Sex Type Anes PTL Lv  2 Term     M Vag-Spont   LIV  1 Term     M Vag-Spont   LIV      The following portions of the patient's history were reviewed and updated as appropriate: allergies, current medications, past family history, past medical history, past social history, past surgical history and problem list. Health Maintenance  Topic Date Due   Eye exam for diabetics  12/03/2023   Complete foot exam   01/23/2024   Flu Shot  06/04/2024   Hepatitis B Vaccine (1 of 3 - 19+ 3-dose series) 06/09/2025*   Yearly kidney health urinalysis for diabetes  12/09/2024   Hemoglobin A1C  12/10/2024   Yearly kidney function blood test for diabetes  03/09/2025   Breast Cancer Screening  08/10/2025   Pap with HPV screening  08/20/2027   Colon Cancer Screening  10/14/2031   DTaP/Tdap/Td vaccine (3 - Td or Tdap) 11/17/2031   Pneumococcal Vaccine  Completed   Hepatitis C Screening  Completed   HIV Screening  Completed   HPV Vaccine  Aged Out   Meningitis B Vaccine  Aged Out   COVID-19 Vaccine  Discontinued  *Topic was postponed. The date shown is not the original due date.     Review of Systems Pertinent items noted in HPI and remainder of comprehensive ROS otherwise negative.  Physical Exam:  BP (!) 142/87   Pulse 71   Wt 183 lb 6.4 oz (83.2 kg)   LMP 10/24/2021   BMI 35.23 kg/m  Physical Exam Vitals and nursing note reviewed.  Constitutional:  Appearance: Normal appearance.  Cardiovascular:     Rate and Rhythm: Normal rate.  Pulmonary:     Effort: Pulmonary effort is normal.     Breath sounds: Normal breath sounds.  Neurological:     General: No focal deficit present.     Mental Status: She is alert and oriented to person, place, and time.  Psychiatric:        Mood and Affect: Mood normal.        Behavior: Behavior normal.        Thought Content: Thought content normal.        Judgment: Judgment normal.        Assessment and Plan:   Assessment & Plan Menopause with resolved hot flashes Symptoms controlled with Veozah . No abnormal discharge or  bleeding post-ablation. Pap smear current. - Next Pap smear due 2026.  Resolved vertigo Vertigo resolved, etiology unclear. Do not necessarily suspect it is related to menopause given it's transience and lack of relation to other menopausal symptoms.     Follow-up: No follow-ups on file.      Carter Quarry, MD Obstetrician & Gynecologist, [facility] [employer], [facility]

## 2024-07-19 ENCOUNTER — Other Ambulatory Visit: Payer: Self-pay | Admitting: Cardiology

## 2024-07-22 DIAGNOSIS — G43719 Chronic migraine without aura, intractable, without status migrainosus: Secondary | ICD-10-CM | POA: Diagnosis not present

## 2024-07-22 DIAGNOSIS — M542 Cervicalgia: Secondary | ICD-10-CM | POA: Diagnosis not present

## 2024-07-24 ENCOUNTER — Other Ambulatory Visit: Payer: Self-pay | Admitting: Family Medicine

## 2024-07-26 NOTE — Telephone Encounter (Signed)
 Requested Prescriptions  Pending Prescriptions Disp Refills   OZEMPIC , 0.25 OR 0.5 MG/DOSE, 2 MG/3ML SOPN [Pharmacy Med Name: OZEMPIC  0.25 OR 0.5MG  DOS(2MG /3ML)] 9 mL 1    Sig: INJECT 0.5MG  UNDER THE SKIN ONCE A WEEK     Endocrinology:  Diabetes - GLP-1 Receptor Agonists - semaglutide  Passed - 07/26/2024  2:42 PM      Passed - HBA1C in normal range and within 180 days    HbA1c, POC (controlled diabetic range)  Date Value Ref Range Status  06/09/2024 5.3 0.0 - 7.0 % Final         Passed - Cr in normal range and within 360 days    Creat  Date Value Ref Range Status  06/21/2016 0.68 0.50 - 1.10 mg/dL Final   Creatinine, Ser  Date Value Ref Range Status  03/09/2024 0.69 0.57 - 1.00 mg/dL Final         Passed - Valid encounter within last 6 months    Recent Outpatient Visits           1 month ago Type 2 diabetes mellitus with unspecified complications (HCC)   [facility] [place]. [facility] Theotis Haze ORN, NP   4 months ago Primary hypertension   [facility] Of [place]. [facility] [place], [place] W, NP   7 months ago Type 2 diabetes mellitus with unspecified complications [facility])   [facility] [place]. [facility] Theotis Haze ORN, NP   10 months ago Primary hypertension   [facility] Dept Of [place]. [facility] Theotis Haze ORN, NP   1 year ago Encounter for annual physical exam   [facility] Of [place]. [facility] Theotis Haze ORN, [place]

## 2024-08-11 ENCOUNTER — Ambulatory Visit
Admission: RE | Admit: 2024-08-11 | Discharge: 2024-08-11 | Disposition: A | Discharge status: Home / Self Care | Source: Ambulatory Visit | Attending: Nurse Practitioner | Admitting: Nurse Practitioner

## 2024-08-11 DIAGNOSIS — Z1231 Encounter for screening mammogram for malignant neoplasm of breast: Secondary | ICD-10-CM | POA: Diagnosis not present

## 2024-08-15 ENCOUNTER — Other Ambulatory Visit: Payer: Self-pay | Admitting: Nurse Practitioner

## 2024-08-15 DIAGNOSIS — N951 Menopausal and female climacteric states: Secondary | ICD-10-CM

## 2024-08-16 ENCOUNTER — Other Ambulatory Visit: Payer: Self-pay | Admitting: Nurse Practitioner

## 2024-08-18 ENCOUNTER — Ambulatory Visit: Payer: Self-pay | Admitting: Nurse Practitioner

## 2024-08-20 ENCOUNTER — Other Ambulatory Visit: Payer: Self-pay

## 2024-08-20 ENCOUNTER — Encounter (HOSPITAL_BASED_OUTPATIENT_CLINIC_OR_DEPARTMENT_OTHER): Payer: Self-pay

## 2024-08-20 ENCOUNTER — Emergency Department (HOSPITAL_BASED_OUTPATIENT_CLINIC_OR_DEPARTMENT_OTHER)
Admission: EM | Admit: 2024-08-20 | Discharge: 2024-08-20 | Disposition: A | Discharge status: Home / Self Care | Attending: Emergency Medicine | Admitting: Emergency Medicine

## 2024-08-20 ENCOUNTER — Emergency Department (HOSPITAL_BASED_OUTPATIENT_CLINIC_OR_DEPARTMENT_OTHER)

## 2024-08-20 DIAGNOSIS — I10 Essential (primary) hypertension: Secondary | ICD-10-CM | POA: Insufficient documentation

## 2024-08-20 DIAGNOSIS — D696 Thrombocytopenia, unspecified: Secondary | ICD-10-CM | POA: Diagnosis not present

## 2024-08-20 DIAGNOSIS — E119 Type 2 diabetes mellitus without complications: Secondary | ICD-10-CM | POA: Diagnosis not present

## 2024-08-20 DIAGNOSIS — Z79899 Other long term (current) drug therapy: Secondary | ICD-10-CM | POA: Insufficient documentation

## 2024-08-20 DIAGNOSIS — Z7984 Long term (current) use of oral hypoglycemic drugs: Secondary | ICD-10-CM | POA: Diagnosis not present

## 2024-08-20 DIAGNOSIS — R079 Chest pain, unspecified: Secondary | ICD-10-CM | POA: Diagnosis not present

## 2024-08-20 DIAGNOSIS — R0789 Other chest pain: Secondary | ICD-10-CM | POA: Diagnosis not present

## 2024-08-20 LAB — CBC
HCT: 40.6 % (ref 36.0–46.0)
Hemoglobin: 13.4 g/dL (ref 12.0–15.0)
MCH: 29.2 pg (ref 26.0–34.0)
MCHC: 33 g/dL (ref 30.0–36.0)
MCV: 88.5 fL (ref 80.0–100.0)
Platelets: 68 K/uL — ABNORMAL LOW (ref 150–400)
RBC: 4.59 MIL/uL (ref 3.87–5.11)
RDW: 13.6 % (ref 11.5–15.5)
WBC: 5.1 K/uL (ref 4.0–10.5)
nRBC: 0 % (ref 0.0–0.2)

## 2024-08-20 LAB — TROPONIN T, HIGH SENSITIVITY
Troponin T High Sensitivity: <15 ng/L (ref 0–19)
Troponin T High Sensitivity: <15 ng/L (ref 0–19)

## 2024-08-20 LAB — BASIC METABOLIC PANEL WITH GFR
Anion gap: 11 (ref 5–15)
BUN: 14 mg/dL (ref 6–20)
CO2: 25 mmol/L (ref 22–32)
Calcium: 9.7 mg/dL (ref 8.9–10.3)
Chloride: 107 mmol/L (ref 98–111)
Creatinine, Ser: 0.67 mg/dL (ref 0.44–1.00)
GFR, Estimated: >60 mL/min (ref >60)
Glucose, Bld: 92 mg/dL (ref 70–99)
Potassium: 4.4 mmol/L (ref 3.5–5.1)
Sodium: 144 mmol/L (ref 135–145)

## 2024-08-20 LAB — RESP PANEL BY RT-PCR (RSV, FLU A&B, COVID)  RVPGX2
Influenza A by PCR: NEGATIVE
Influenza B by PCR: NEGATIVE
Resp Syncytial Virus by PCR: NEGATIVE
SARS Coronavirus 2 by RT PCR: NEGATIVE

## 2024-08-20 LAB — HCG, SERUM, QUALITATIVE: Preg, Serum: NEGATIVE

## 2024-08-20 MED ORDER — ALUM & MAG HYDROXIDE-SIMETH 200-200-20 MG/5ML PO SUSP
30.0000 mL | Freq: Once | ORAL | Status: AC
  Administered 2024-08-20: 30 mL via ORAL
  Filled 2024-08-20: qty 30

## 2024-08-20 MED ORDER — LIDOCAINE VISCOUS HCL 2 % MT SOLN
15.0000 mL | Freq: Once | OROMUCOSAL | Status: AC
  Administered 2024-08-20: 15 mL via ORAL
  Filled 2024-08-20: qty 15

## 2024-08-20 MED ORDER — IPRATROPIUM-ALBUTEROL 0.5-2.5 (3) MG/3ML IN SOLN
3.0000 mL | Freq: Once | RESPIRATORY_TRACT | Status: AC
  Administered 2024-08-20: 3 mL via RESPIRATORY_TRACT
  Filled 2024-08-20: qty 3

## 2024-08-20 NOTE — ED Triage Notes (Signed)
 Intermittent generalized chest pain and pressure since yesterday morning. SHOB  Denies N/V, radiating pain

## 2024-08-20 NOTE — Discharge Instructions (Addendum)
 It was a pleasure caring for you today in the emergency department.  Return to the Emergency Department if you have unusual chest pain, pressure, or discomfort, shortness of breath, nausea, vomiting, burping, heartburn, tingling upper body parts, sweating, cold, clammy skin, or racing heartbeat. Call 911 if you think you are having a heart attack. Take all medications as prescribed - notify your doctor if you have any side effects. Follow cardiac diet - avoid fatty & fried foods, don't eat too much red meat, eat lots of fruits & vegetables, and dairy products should be low fat. Please lose weight if you are overweight. Become more active with walking, gardening, or any other activity that gets you to moving.  Please follow-up with your PCP regarding chronic low platelet count   Please return to the emergency department immediately for any new or concerning symptoms, or if you get worse.

## 2024-08-20 NOTE — ED Notes (Signed)
D/c paperwork reviewed with pt, including follow up care.  No questions or concerns voiced at time of d/c. . Pt verbalized understanding, Ambulatory without assistance to ED exit, NAD.   

## 2024-08-20 NOTE — ED Provider Notes (Signed)
 [facility] Provider Note  CSN: [id number] Arrival date & time: 08/20/24 9175  Chief Complaint(s) Chest Pain  HPI Angela Castillo is a 48 y.o. female with past medical history as below, significant for hypertension, DM, GERD, liver cirrhosis secondary to NASH who presents to the ED with complaint of chest pain  Patient reports her chest pain initially started yesterday, was intermittent at that time, feels similar to prior episodes of sporadic chest pain.  Tightness, pressure sensation midsternal.  Symptoms did resolve spontaneously.  She had recurrence of the chest pain approxi-1 hour prior to arrival to the ER today.  Also characterizes pressure, tightness sensation.  Midsternal, no radiation to her left shoulder.  This began while she was at work, patient thinks this is worsened with stress.  No associated dyspnea or palpitations.  No nausea or vomiting, no syncope or near syncope.  She took Tylenol  which did not alleviate her symptoms.  She is still symptomatic   Past Medical History Past Medical History:  Diagnosis Date   Anemia Dx January 03 2015   Arthritis Aug 04 2002   Cant fully remember dates   Diabetes mellitus without complication (HCC)    Elevated liver enzymes    Essential hypertension Dx Apr 2016   Fatty liver    GERD (gastroesophageal reflux disease)    Again cant remember date   H/O: upper GI bleed 03/2019    variceal bleed    Headache    History of blood transfusion 03/2019   Liver cirrhosis secondary to NASH (HCC)    Neuropathy    Pneumonia 03/2019   Reflux    Thrombocytopenia    Patient Active Problem List   Diagnosis Date Noted   Carpal tunnel syndrome of left wrist 02/05/2024   Paresthesia 02/18/2023   Neck pain 02/18/2023   Abnormal uterine bleeding 11/20/2021   Menorrhagia 11/01/2021   Ingrown toenail 10/16/2019   Cirrhosis (HCC) 07/23/2019   Diabetes mellitus (HCC) 04/20/2019   Acute respiratory  insufficiency    H/O: upper GI bleed 03/21/2019   Impingement syndrome of left shoulder 04/21/2017   Low back pain 03/18/2016   Left shoulder pain 03/18/2016   Esophageal reflux 03/18/2016   Lateral pain of left hip 10/25/2015   Heel pain, bilateral 05/03/2015   Obesity (BMI 30-39.9) 05/03/2015   Hearing loss in right ear 03/01/2015   Family history of diabetes mellitus in mother 03/01/2015   Essential hypertension 01/18/2015   Anemia, iron deficiency 01/04/2015   Musculoskeletal chest pain 01/04/2015   Thrombocytopenia 01/04/2015   Transaminitis 01/03/2015   Home Medication(s) Prior to Admission medications   Medication Sig Start Date End Date Taking? Authorizing Provider  Accu-Chek Softclix Lancets lancets USE TO CHECK BLOOD SUGAR THREE TIMES DAILY 12/04/22   Newlin, Enobong, MD  acetaminophen  (TYLENOL ) 500 MG tablet Take 500-1,000 mg by mouth every 6 (six) hours as needed for mild pain.    [provider]  albuterol  (VENTOLIN  HFA) 108 (90 Base) MCG/ACT inhaler Inhale 2 puffs into the lungs every 6 (six) hours as needed for wheezing or shortness of breath. 09/07/23   Fleming, Zelda W, NP  Ascorbic Acid (VITAMIN C) 100 MG tablet Take 100 mg by mouth daily.    [provider]  Biotin 89999 MCG TABS Take 10,000 mcg by mouth daily.    [provider]  Blood Glucose Monitoring Suppl (ACCU-CHEK GUIDE) w/Device KIT Use to check blood sugar three times daily. 05/01/22  Newlin, Enobong, MD  dapagliflozin  propanediol (FARXIGA ) 5 MG TABS tablet Take 1 tablet (5 mg total) by mouth daily. 02/23/24   Fleming, Zelda W, NP  diclofenac  Sodium (VOLTAREN ) 1 % GEL Apply 4 g topically 4 (four) times daily. 12/10/23   Fleming, Zelda W, NP  esomeprazole  (NEXIUM ) 40 MG capsule Take 40 mg by mouth 2 (two) times daily before a meal. 03/05/23   [provider]  ferrous sulfate  325 (65 FE) MG tablet Take 1 tablet (325 mg total) by mouth 2 (two) times daily with a meal. 09/09/23    Fleming, Zelda W, NP  fluticasone  (FLONASE ) 50 MCG/ACT nasal spray Place 2 sprays into both nostrils daily. 01/12/24   Fleming, Zelda W, NP  glucose blood (ACCU-CHEK GUIDE) test strip USE TO CHECK BLOOD SUGAR THREE TIMES DAILY 12/04/22   Newlin, Enobong, MD  icosapent  Ethyl (VASCEPA ) 1 g capsule Take 2 capsules (2 g total) by mouth 2 (two) times daily. 02/23/24   Fleming, Zelda W, NP  ketoconazole  (NIZORAL ) 2 % cream Apply 1 application  topically daily as needed for irritation (as needed). Patient not taking: Reported on 07/15/2024    [provider]  losartan  (COZAAR ) 50 MG tablet TAKE 1 TABLET(50 MG) BY MOUTH DAILY 07/19/24   Kate Lonni CROME, MD  meclizine  (ANTIVERT ) 25 MG tablet Take 1-2 tablets (25-50 mg total) by mouth 2 (two) times daily. 06/26/24   Fleming, Zelda W, NP  Menthol, Topical Analgesic, (ICY HOT MEDICATED SPRAY) 16 % LIQD Apply 1 spray topically daily as needed (pain).    [provider]  metFORMIN  (GLUCOPHAGE ) 500 MG tablet Take 1 tablet (500 mg total) by mouth 2 (two) times daily with a meal. 05/16/24   Theotis Haze ORN, NP  methocarbamol  (ROBAXIN ) 500 MG tablet Take 1 tablet (500 mg total) by mouth every 8 (eight) hours as needed for muscle spasms. 07/15/24   Theotis Haze ORN, NP  Microlet Lancets MISC USE UP TO FOUR TIMES DAILY AS DIRECTED Patient not taking: Reported on 07/01/2024 04/11/21   Fleming, Zelda W, NP  mupirocin  ointment (BACTROBAN ) 2 % APPLY TOPICALLY TO THE AFFECTED AREA TWICE DAILY 08/17/24   Newlin, Enobong, MD  ondansetron  (ZOFRAN -ODT) 4 MG disintegrating tablet DISSOLVE 1 TABLET(4 MG) ON THE TONGUE EVERY 8 HOURS AS NEEDED FOR VOMITING OR NAUSEA 04/16/24   Newlin, Enobong, MD  OZEMPIC , 0.25 OR 0.5 MG/DOSE, 2 MG/3ML SOPN INJECT 0.5MG  UNDER THE SKIN ONCE A WEEK 07/26/24   Fleming, Zelda W, NP  PARoxetine  (PAXIL ) 10 MG tablet TAKE 1 TABLET(10 MG) BY MOUTH DAILY FOR HOT FLASHES Patient not taking: Reported on 07/15/2024 07/15/24   Theotis Haze ORN, NP   propranolol  (INDERAL ) 20 MG tablet Take 1 tablet (20 mg total) by mouth 2 (two) times daily. 02/23/24   Fleming, Zelda W, NP  rosuvastatin  (CRESTOR ) 10 MG tablet Take 1 tablet (10 mg total) by mouth daily. 02/23/24   Fleming, Zelda W, NP  VEOZAH  45 MG TABS TAKE 1 TABLET(45 MG) BY MOUTH DAILY 08/16/24   Fleming, Zelda W, NP  zonisamide (ZONEGRAN) 50 MG capsule Take 150 mg by mouth daily. 01/30/24   [provider]  Past Surgical History Past Surgical History:  Procedure Laterality Date   DILITATION & CURRETTAGE/HYSTROSCOPY WITH HYDROTHERMAL ABLATION N/A 11/20/2021   Procedure: DILATATION & CURETTAGE/HYSTEROSCOPY WITH HYDROTHERMAL ABLATION;  Surgeon: Fredirick Glenys RAMAN, MD;  Location: [facility] OR;  Service: Gynecology;  Laterality: N/A;  rep will be here confirmed on 11/12/21 CS   ESOPHAGEAL BANDING  03/22/2019   Procedure: ESOPHAGEAL BANDING;  Surgeon: Dianna Specking, MD;  Location: [facility];  Service: Endoscopy;;   ESOPHAGEAL BANDING  03/23/2019   Procedure: ESOPHAGEAL BANDING;  Surgeon: Dianna Specking, MD;  Location: [facility];  Service: Endoscopy;;   ESOPHAGEAL BANDING  09/20/2019   Procedure: ESOPHAGEAL BANDING;  Surgeon: Dianna Specking, MD;  Location: [facility];  Service: Endoscopy;;   ESOPHAGEAL BANDING N/A 12/21/2019   Procedure: ESOPHAGEAL BANDING;  Surgeon: Dianna Specking, MD;  Location: [facility];  Service: Endoscopy;  Laterality: N/A;   ESOPHAGOGASTRODUODENOSCOPY N/A 03/22/2019   Procedure: ESOPHAGOGASTRODUODENOSCOPY (EGD);  Surgeon: Dianna Specking, MD;  Location: [facility];  Service: Endoscopy;  Laterality: N/A;   ESOPHAGOGASTRODUODENOSCOPY N/A 03/23/2019   Procedure: ESOPHAGOGASTRODUODENOSCOPY (EGD);  Surgeon: Dianna Specking, MD;  Location: [facility];  Service: Endoscopy;  Laterality: N/A;   ESOPHAGOGASTRODUODENOSCOPY  (EGD) WITH PROPOFOL  N/A 07/23/2019   Procedure: ESOPHAGOGASTRODUODENOSCOPY (EGD) WITH PROPOFOL  with possible banding;  Surgeon: Dianna Specking, MD;  Location: [facility];  Service: Endoscopy;  Laterality: N/A;   ESOPHAGOGASTRODUODENOSCOPY (EGD) WITH PROPOFOL  N/A 09/20/2019   Procedure: ESOPHAGOGASTRODUODENOSCOPY (EGD) WITH PROPOFOL ;  Surgeon: Dianna Specking, MD;  Location: [facility];  Service: Endoscopy;  Laterality: N/A;   ESOPHAGOGASTRODUODENOSCOPY (EGD) WITH PROPOFOL  N/A 12/21/2019   Procedure: ESOPHAGOGASTRODUODENOSCOPY (EGD) WITH PROPOFOL  with possible banding;  Surgeon: Dianna Specking, MD;  Location: [facility];  Service: Endoscopy;  Laterality: N/A;   HERNIA REPAIR     TUBAL LIGATION  07/25/2000   VENTRAL HERNIA REPAIR N/A 07/06/2020   Procedure: VENTRAL HERNIA REPAIR WITH MESH;  Surgeon: Vernetta Berg, MD;  Location: [facility] OR;  Service: General;  Laterality: N/A;   Family History Family History  Problem Relation Age of Onset   Diabetes Father    COPD Mother    Esophageal cancer Mother    Diabetes Mother    Cancer Mother        esophageal   Hypertension Mother    Breast cancer Maternal Aunt    Cancer Maternal Grandmother    ADD / ADHD Son    ADD / ADHD Son    Cancer Maternal Aunt     Social History Social History   Tobacco Use   Smoking status: Never   Smokeless tobacco: Never  Vaping Use   Vaping status: Never Used  Substance Use Topics   Alcohol use: Not Currently    Comment: rarely-last alcohol drank in 08/2018   Drug use: No   Allergies Nsaids  Review of Systems A thorough review of systems was obtained and all systems are negative except as noted in the HPI and PMH.   Physical Exam Vital Signs  I have reviewed the triage vital signs BP (!) 137/95   Pulse 64   Temp 98.3 F (36.8 C) (Oral)   Resp 19   LMP 10/24/2021   SpO2 100%  Physical Exam Vitals and nursing note reviewed.  Constitutional:      General: She is not in acute  distress.    Appearance: Normal appearance.  HENT:     Head: Normocephalic and atraumatic.     Right Ear: External ear normal.     Left Ear: External ear  normal.     Nose: Nose normal.     Mouth/Throat:     Mouth: Mucous membranes are moist.  Eyes:     General: No scleral icterus.       Right eye: No discharge.        Left eye: No discharge.  Cardiovascular:     Rate and Rhythm: Normal rate and regular rhythm.     Pulses: Normal pulses.     Heart sounds: Normal heart sounds.  Pulmonary:     Effort: Pulmonary effort is normal. No respiratory distress.     Breath sounds: Normal breath sounds. No stridor.  Abdominal:     General: Abdomen is flat. There is no distension.     Palpations: Abdomen is soft.     Tenderness: There is no abdominal tenderness.  Musculoskeletal:     Cervical back: No rigidity.     Right lower leg: No edema.     Left lower leg: No edema.  Skin:    General: Skin is warm and dry.     Capillary Refill: Capillary refill takes less than 2 seconds.  Neurological:     Mental Status: She is alert.  Psychiatric:        Mood and Affect: Mood normal.        Behavior: Behavior normal. Behavior is cooperative.     ED Results and Treatments Labs (all labs ordered are listed, but only abnormal results are displayed) Labs Reviewed  CBC - Abnormal; Notable for the following components:      Result Value   Platelets 68 (*)    All other components within normal limits  RESP PANEL BY RT-PCR (RSV, FLU A&B, COVID)  RVPGX2  BASIC METABOLIC PANEL WITH GFR  HCG, SERUM, QUALITATIVE  TROPONIN T, HIGH SENSITIVITY  TROPONIN T, HIGH SENSITIVITY                                                                                                                          Radiology DG Chest Port 1 View Result Date: 08/20/2024 EXAM: 1 VIEW(S) XRAY OF THE CHEST 08/20/2024 09:05:00 AM COMPARISON: 05/05/2023 CLINICAL HISTORY: cp. Intermittent generalized chest pain and pressure  since yesterday morning. SHOB; Denies N/V, radiating pain; Hx - DM, HTN. FINDINGS: LUNGS AND PLEURA: No focal pulmonary opacity. No pulmonary edema. No pleural effusion. No pneumothorax. HEART AND MEDIASTINUM: No acute abnormality of the cardiac and mediastinal silhouettes. BONES AND SOFT TISSUES: No acute osseous abnormality. IMPRESSION: 1. No acute cardiopulmonary disease. Electronically signed by: Lynwood Seip MD 08/20/2024 09:18 AM EDT RP Workstation: [id number]    Pertinent labs & imaging results that were available during my care of the patient were reviewed by me and considered in my medical decision making (see MDM for details).  Medications Ordered in ED Medications  alum & mag hydroxide-simeth (MAALOX/MYLANTA) 200-200-20 MG/5ML suspension 30 mL (30 mLs Oral Given 08/20/24 0901)    And  lidocaine  (XYLOCAINE ) 2 % viscous mouth solution 15  mL (15 mLs Oral Given 08/20/24 0901)  ipratropium-albuterol  (DUONEB) 0.5-2.5 (3) MG/3ML nebulizer solution 3 mL (3 mLs Nebulization Given 08/20/24 1113)                                                                                                                                     Procedures Procedures  (including critical care time)  Medical Decision Making / ED Course    Medical Decision Making:    Aniah Pauli is a 48 y.o. female with past medical history as below, significant for hypertension, DM, GERD, liver cirrhosis secondary to NASH who presents to the ED with complaint of chest pain. The complaint involves an extensive differential diagnosis and also carries with it a high risk of complications and morbidity.  Serious etiology was considered. Differential includes all life-threatening causes for chest pain. This includes but is not exclusive to acute coronary syndrome, aortic dissection, pulmonary embolism, cardiac tamponade, community-acquired pneumonia, pericarditis, musculoskeletal chest wall pain, etc.   Complete initial  physical exam performed, notably the patient was in no acute distress, resting comfortably in stretcher.    Reviewed and confirmed nursing documentation for past medical history, family history, social history.  Vital signs reviewed.    Atypical chest pain> - Midsternal chest pain radiation to the left shoulder, pressure, tightness sensation.  Not exertional, no associated dyspnea or palpitations, nausea or syncope - Symptoms improved in the ER.  EKG without STEMI, troponin negative x 2, chest x-ray unremarkable, HDS. - Tolerating p.o. without difficulty, ambulatory, no hypoxia; feeling much better  Clinical Course as of 08/20/24 1229  Fri Aug 20, 2024  1215 Heart score is low [SG]  1215 Well score is low [SG]    Clinical Course User Index [SG] Elnor Jayson LABOR, DO    The patient's chest pain is not suggestive of pulmonary embolus, cardiac ischemia, aortic dissection, pericarditis, myocarditis, pulmonary embolism, pneumothorax, pneumonia, Zoster, or esophageal perforation, or other serious etiology.  Historically not abrupt in onset, tearing or ripping, pulses symmetric. EKG nonspecific for ischemia/infarction. No dysrhythmias, brugada, WPW, prolonged QT noted.   Troponin negative x2. CXR reviewed. Labs without demonstration of acute pathology unless otherwise noted above. Low HEART Score: 0-3 points (0.9-1.7% risk of MACE).  Given the extremely low risk of these diagnoses further testing and evaluation for these possibilities does not appear to be indicated at this time. Patient in no distress and overall condition improved here in the ED. Detailed discussions were had with the patient regarding current findings, and need for close f/u with PCP or on call doctor. The patient has been instructed to return immediately if the symptoms worsen in any way for re-evaluation. Patient verbalized understanding and is in agreement with current care plan. All questions answered prior to  discharge.                Additional history obtained: -Additional history obtained from na -External  records from outside source obtained and reviewed including: Chart review including previous notes, labs, imaging, consultation notes including  All medications, primary care documentation   Lab Tests: -I ordered, reviewed, and interpreted labs.   The pertinent results include:   Labs Reviewed  CBC - Abnormal; Notable for the following components:      Result Value   Platelets 68 (*)    All other components within normal limits  RESP PANEL BY RT-PCR (RSV, FLU A&B, COVID)  RVPGX2  BASIC METABOLIC PANEL WITH GFR  HCG, SERUM, QUALITATIVE  TROPONIN T, HIGH SENSITIVITY  TROPONIN T, HIGH SENSITIVITY    Notable for thrombocytopenia noted, chronic  EKG   EKG Interpretation Date/Time:  Friday August 20 2024 08:33:24 EDT Ventricular Rate:  66 PR Interval:  143 QRS Duration:  100 QT Interval:  410 QTC Calculation: 430 R Axis:   42  Text Interpretation: Sinus rhythm Borderline T abnormalities, anterior leads Confirmed by Elnor Savant ([id number]) on 08/20/2024 8:41:42 AM         Imaging Studies ordered: I ordered imaging studies including chest x-ray I independently visualized the following imaging with scope of interpretation limited to determining acute life threatening conditions related to emergency care; findings noted above I agree with the radiologist interpretation If any imaging was obtained with contrast I closely monitored patient for any possible adverse reaction a/w contrast administration in the emergency department   Medicines ordered and prescription drug management: Meds ordered this encounter  Medications   AND Linked Order Group    alum & mag hydroxide-simeth (MAALOX/MYLANTA) 200-200-20 MG/5ML suspension 30 mL    lidocaine  (XYLOCAINE ) 2 % viscous mouth solution 15 mL   ipratropium-albuterol  (DUONEB) 0.5-2.5 (3) MG/3ML nebulizer solution 3 mL     -I have reviewed the patients home medicines and have made adjustments as needed   Consultations Obtained: Not applicable  Cardiac Monitoring: The patient was maintained on a cardiac monitor.  I personally viewed and interpreted the cardiac monitored which showed an underlying rhythm of: NSR Continuous pulse oximetry interpreted by myself, 100% on RA.    Social Determinants of Health:  Diagnosis or treatment significantly limited by social determinants of health: Not applicable   Reevaluation: After the interventions noted above, I reevaluated the patient and found that they have improved  Co morbidities that complicate the patient evaluation  Past Medical History:  Diagnosis Date   Anemia Dx January 03 2015   Arthritis Aug 04 2002   Cant fully remember dates   Diabetes mellitus without complication (HCC)    Elevated liver enzymes    Essential hypertension Dx Apr 2016   Fatty liver    GERD (gastroesophageal reflux disease)    Again cant remember date   H/O: upper GI bleed 03/2019    variceal bleed    Headache    History of blood transfusion 03/2019   Liver cirrhosis secondary to NASH (HCC)    Neuropathy    Pneumonia 03/2019   Reflux    Thrombocytopenia       Dispostion: Disposition decision including need for hospitalization was considered, and patient discharged from emergency department.    Final Clinical Impression(s) / ED Diagnoses Final diagnoses:  Chest pain with low risk of acute coronary syndrome  Thrombocytopenia        Elnor Savant LABOR, DO 08/20/24 1230

## 2024-08-23 DIAGNOSIS — K219 Gastro-esophageal reflux disease without esophagitis: Secondary | ICD-10-CM | POA: Diagnosis not present

## 2024-08-23 DIAGNOSIS — K7460 Unspecified cirrhosis of liver: Secondary | ICD-10-CM | POA: Diagnosis not present

## 2024-08-24 ENCOUNTER — Encounter: Payer: Self-pay | Admitting: Nurse Practitioner

## 2024-08-25 DIAGNOSIS — G43719 Chronic migraine without aura, intractable, without status migrainosus: Secondary | ICD-10-CM | POA: Diagnosis not present

## 2024-08-25 DIAGNOSIS — M542 Cervicalgia: Secondary | ICD-10-CM | POA: Diagnosis not present

## 2024-09-06 ENCOUNTER — Encounter: Payer: Self-pay | Admitting: Radiology

## 2024-09-06 DIAGNOSIS — K7460 Unspecified cirrhosis of liver: Secondary | ICD-10-CM | POA: Diagnosis not present

## 2024-09-08 ENCOUNTER — Encounter: Payer: Self-pay | Admitting: Nurse Practitioner

## 2024-09-08 ENCOUNTER — Ambulatory Visit: Attending: Nurse Practitioner | Admitting: Nurse Practitioner

## 2024-09-08 VITALS — BP 130/76 | HR 67 | Resp 19 | Ht 60.5 in | Wt 179.0 lb

## 2024-09-08 DIAGNOSIS — B9689 Other specified bacterial agents as the cause of diseases classified elsewhere: Secondary | ICD-10-CM

## 2024-09-08 DIAGNOSIS — J019 Acute sinusitis, unspecified: Secondary | ICD-10-CM | POA: Diagnosis not present

## 2024-09-08 DIAGNOSIS — Z23 Encounter for immunization: Secondary | ICD-10-CM | POA: Diagnosis not present

## 2024-09-08 DIAGNOSIS — I10 Essential (primary) hypertension: Secondary | ICD-10-CM | POA: Diagnosis not present

## 2024-09-08 MED ORDER — AMOXICILLIN-POT CLAVULANATE 875-125 MG PO TABS: 1.0000 | ORAL_TABLET | Freq: Two times a day (BID) | ORAL | 0 refills | Status: AC

## 2024-09-08 NOTE — Progress Notes (Signed)
 Assessment & Plan:  Angela Castillo was seen today for hypertension and sinus problem.  Diagnoses and all orders for this visit:  Acute bacterial sinusitis -     amoxicillin -clavulanate (AUGMENTIN ) 875-125 MG tablet; Take 1 tablet by mouth 2 (two) times daily for 7 days. Start nasal saline irrigation    Primary hypertension Continue all antihypertensives as prescribed.  Reminded to bring in blood pressure log for follow  up appointment.  RECOMMENDATIONS: DASH/Mediterranean Diets are healthier choices for HTN.    Need for influenza vaccination -     Flu vaccine trivalent PF, 6mos and older(Flulaval,Afluria,Fluarix,Fluzone)    Patient has been counseled on age-appropriate routine health concerns for screening and prevention. These are reviewed and up-to-date. Referrals have been placed accordingly. Immunizations are up-to-date or declined.    Subjective:   Chief Complaint  Patient presents with   Hypertension   Sinus Problem    Feels full and muffled.     Angela Castillo 48 y.o. female presents to office today for follow up to HTN and with sinus pressure  She has a past medical history of Anemia (Dx January 03 2015), DM2, Elevated liver enzymes, Essential hypertension (Dx Apr 2016), Fatty liver, H/O: upper GI bleed (03/2019), Headache, History of blood transfusion (03/2019), Liver cirrhosis secondary to NASH, Neuropathy, Pneumonia (03/2019), Reflux, and Thrombocytopenia.   HTN Blood pressure at goal today with losartan  and propranolol  BP Readings from Last 3 Encounters:  09/08/24 130/76  08/20/24 (!) 137/95  07/15/24 130/89     She has been experiencing sinus congestion and a sensation of fullness in her ears over the past few weeks though she is unsure of the exact onset. The ear sensation is described as feeling like she is 'stuffed with cotton balls,' leading to muffled hearing. This sensation occurs daily, particularly at work, but she cannot identify any specific  triggers. Associated symptoms include sinus pressure along with intermittent headaches, sometimes located in the 'lens' area. She uses a steroid nasal spray, but currently finds it ineffective due to her nasal condition.     Review of Systems  Constitutional:  Negative for fever, malaise/fatigue and weight loss.  HENT:  Positive for congestion, hearing loss and sinus pain. Negative for nosebleeds.   Respiratory: Negative.  Negative for cough and shortness of breath.   Cardiovascular: Negative.  Negative for chest pain, palpitations and leg swelling.  Gastrointestinal: Negative.  Negative for heartburn, nausea and vomiting.  Musculoskeletal:  Positive for myalgias.  Neurological: Negative.  Negative for dizziness, focal weakness, seizures and headaches.  Psychiatric/Behavioral: Negative.  Negative for suicidal ideas.     Past Medical History:  Diagnosis Date   Anemia Dx January 03 2015   Arthritis Aug 04 2002   Cant fully remember dates   Diabetes mellitus without complication (HCC)    Elevated liver enzymes    Essential hypertension Dx Apr 2016   Fatty liver    GERD (gastroesophageal reflux disease)    Again cant remember date   H/O: upper GI bleed 03/2019    variceal bleed    Headache    History of blood transfusion 03/2019   Liver cirrhosis secondary to NASH (HCC)    Neuropathy    Pneumonia 03/2019   Reflux    Thrombocytopenia     Past Surgical History:  Procedure Laterality Date   DILITATION & CURRETTAGE/HYSTROSCOPY WITH HYDROTHERMAL ABLATION N/A 11/20/2021   Procedure: DILATATION & CURETTAGE/HYSTEROSCOPY WITH HYDROTHERMAL ABLATION;  Surgeon: Fredirick Glenys RAMAN, MD;  Location: [facility] OR;  Service: Gynecology;  Laterality: N/A;  rep will be here confirmed on 11/12/21 CS   ESOPHAGEAL BANDING  03/22/2019   Procedure: ESOPHAGEAL BANDING;  Surgeon: Dianna Specking, MD;  Location: [facility];  Service: Endoscopy;;   ESOPHAGEAL BANDING  03/23/2019   Procedure: ESOPHAGEAL BANDING;   Surgeon: Dianna Specking, MD;  Location: [facility];  Service: Endoscopy;;   ESOPHAGEAL BANDING  09/20/2019   Procedure: ESOPHAGEAL BANDING;  Surgeon: Dianna Specking, MD;  Location: [facility];  Service: Endoscopy;;   ESOPHAGEAL BANDING N/A 12/21/2019   Procedure: ESOPHAGEAL BANDING;  Surgeon: Dianna Specking, MD;  Location: [facility];  Service: Endoscopy;  Laterality: N/A;   ESOPHAGOGASTRODUODENOSCOPY N/A 03/22/2019   Procedure: ESOPHAGOGASTRODUODENOSCOPY (EGD);  Surgeon: Dianna Specking, MD;  Location: [facility];  Service: Endoscopy;  Laterality: N/A;   ESOPHAGOGASTRODUODENOSCOPY N/A 03/23/2019   Procedure: ESOPHAGOGASTRODUODENOSCOPY (EGD);  Surgeon: Dianna Specking, MD;  Location: [facility];  Service: Endoscopy;  Laterality: N/A;   ESOPHAGOGASTRODUODENOSCOPY (EGD) WITH PROPOFOL  N/A 07/23/2019   Procedure: ESOPHAGOGASTRODUODENOSCOPY (EGD) WITH PROPOFOL  with possible banding;  Surgeon: Dianna Specking, MD;  Location: [facility];  Service: Endoscopy;  Laterality: N/A;   ESOPHAGOGASTRODUODENOSCOPY (EGD) WITH PROPOFOL  N/A 09/20/2019   Procedure: ESOPHAGOGASTRODUODENOSCOPY (EGD) WITH PROPOFOL ;  Surgeon: Dianna Specking, MD;  Location: [facility];  Service: Endoscopy;  Laterality: N/A;   ESOPHAGOGASTRODUODENOSCOPY (EGD) WITH PROPOFOL  N/A 12/21/2019   Procedure: ESOPHAGOGASTRODUODENOSCOPY (EGD) WITH PROPOFOL  with possible banding;  Surgeon: Dianna Specking, MD;  Location: [facility];  Service: Endoscopy;  Laterality: N/A;   HERNIA REPAIR     TUBAL LIGATION  07/25/2000   VENTRAL HERNIA REPAIR N/A 07/06/2020   Procedure: VENTRAL HERNIA REPAIR WITH MESH;  Surgeon: Vernetta Berg, MD;  Location: [facility] OR;  Service: General;  Laterality: N/A;    Family History  Problem Relation Age of Onset   Diabetes Father    COPD Mother    Esophageal cancer Mother    Diabetes Mother    Cancer Mother        esophageal   Hypertension Mother    Breast cancer Maternal Aunt     Cancer Maternal Grandmother    ADD / ADHD Son    ADD / ADHD Son    Cancer Maternal Aunt     Social History Reviewed with no changes to be made today.   Outpatient Medications Prior to Visit  Medication Sig Dispense Refill   Accu-Chek Softclix Lancets lancets USE TO CHECK BLOOD SUGAR THREE TIMES DAILY 300 each 0   acetaminophen  (TYLENOL ) 500 MG tablet Take 500-1,000 mg by mouth every 6 (six) hours as needed for mild pain.     albuterol  (VENTOLIN  HFA) 108 (90 Base) MCG/ACT inhaler Inhale 2 puffs into the lungs every 6 (six) hours as needed for wheezing or shortness of breath. 6.7 g 1   Ascorbic Acid (VITAMIN C) 100 MG tablet Take 100 mg by mouth daily.     Biotin 89999 MCG TABS Take 10,000 mcg by mouth daily.     Blood Glucose Monitoring Suppl (ACCU-CHEK GUIDE) w/Device KIT Use to check blood sugar three times daily. 1 kit 0   dapagliflozin  propanediol (FARXIGA ) 5 MG TABS tablet Take 1 tablet (5 mg total) by mouth daily. 90 tablet 1   diclofenac  Sodium (VOLTAREN ) 1 % GEL Apply 4 g topically 4 (four) times daily. 200 g 1   esomeprazole  (NEXIUM ) 40 MG capsule Take 40 mg by mouth 2 (two) times daily before a meal.     ferrous sulfate  325 (65 FE) MG tablet Take  1 tablet (325 mg total) by mouth 2 (two) times daily with a meal. 180 tablet 2   fluticasone  (FLONASE ) 50 MCG/ACT nasal spray Place 2 sprays into both nostrils daily. 16 g 6   glucose blood (ACCU-CHEK GUIDE) test strip USE TO CHECK BLOOD SUGAR THREE TIMES DAILY 300 strip 0   icosapent  Ethyl (VASCEPA ) 1 g capsule Take 2 capsules (2 g total) by mouth 2 (two) times daily. 180 capsule 1   ketoconazole  (NIZORAL ) 2 % cream Apply 1 application  topically daily as needed for irritation (as needed).     losartan  (COZAAR ) 50 MG tablet TAKE 1 TABLET(50 MG) BY MOUTH DAILY 90 tablet 3   meclizine  (ANTIVERT ) 25 MG tablet Take 1-2 tablets (25-50 mg total) by mouth 2 (two) times daily. 60 tablet 0   Menthol, Topical Analgesic, (ICY HOT MEDICATED SPRAY) 16  % LIQD Apply 1 spray topically daily as needed (pain).     metFORMIN  (GLUCOPHAGE ) 500 MG tablet Take 1 tablet (500 mg total) by mouth 2 (two) times daily with a meal. 180 tablet 1   methocarbamol  (ROBAXIN ) 500 MG tablet Take 1 tablet (500 mg total) by mouth every 8 (eight) hours as needed for muscle spasms. 60 tablet 3   Microlet Lancets MISC USE UP TO FOUR TIMES DAILY AS DIRECTED 100 each 11   mupirocin  ointment (BACTROBAN ) 2 % APPLY TOPICALLY TO THE AFFECTED AREA TWICE DAILY 22 g 0   ondansetron  (ZOFRAN -ODT) 4 MG disintegrating tablet DISSOLVE 1 TABLET(4 MG) ON THE TONGUE EVERY 8 HOURS AS NEEDED FOR VOMITING OR NAUSEA 30 tablet 1   OZEMPIC , 0.25 OR 0.5 MG/DOSE, 2 MG/3ML SOPN INJECT 0.5MG  UNDER THE SKIN ONCE A WEEK 9 mL 1   propranolol  (INDERAL ) 20 MG tablet Take 1 tablet (20 mg total) by mouth 2 (two) times daily. 180 tablet 1   rosuvastatin  (CRESTOR ) 10 MG tablet Take 1 tablet (10 mg total) by mouth daily. 90 tablet 1   VEOZAH  45 MG TABS TAKE 1 TABLET(45 MG) BY MOUTH DAILY 30 tablet 1   zonisamide (ZONEGRAN) 50 MG capsule Take 150 mg by mouth daily.     PARoxetine  (PAXIL ) 10 MG tablet TAKE 1 TABLET(10 MG) BY MOUTH DAILY FOR HOT FLASHES (Patient not taking: Reported on 09/08/2024) 90 tablet 0   No facility-administered medications prior to visit.    Allergies  Allergen Reactions   Nsaids     GI BLEED       Objective:    BP 130/76 (BP Location: Left Arm, Patient Position: Sitting, Cuff Size: Normal)   Pulse 67   Resp 19   Ht 5' 0.5 (1.537 m)   Wt 179 lb (81.2 kg)   LMP 10/24/2021   SpO2 100%   BMI 34.38 kg/m  Wt Readings from Last 3 Encounters:  09/08/24 179 lb (81.2 kg)  07/15/24 183 lb 6.4 oz (83.2 kg)  07/01/24 186 lb 9.6 oz (84.6 kg)    Physical Exam Vitals and nursing note reviewed.  Constitutional:      Appearance: She is well-developed.  HENT:     Head: Normocephalic and atraumatic.     Right Ear: Tympanic membrane, ear canal and external ear normal. Decreased  hearing noted.     Left Ear: Tympanic membrane, ear canal and external ear normal. Decreased hearing noted.     Nose:     Right Turbinates: Enlarged and swollen (erythematous).     Left Turbinates: Enlarged and swollen (erythematous).  Cardiovascular:     Rate  and Rhythm: Normal rate and regular rhythm.     Heart sounds: Normal heart sounds. No murmur heard.    No friction rub. No gallop.  Pulmonary:     Effort: Pulmonary effort is normal. No tachypnea or respiratory distress.     Breath sounds: Normal breath sounds. No decreased breath sounds, wheezing, rhonchi or rales.  Chest:     Chest wall: No tenderness.  Musculoskeletal:        General: Normal range of motion.     Cervical back: Normal range of motion.  Skin:    General: Skin is warm and dry.  Neurological:     Mental Status: She is alert and oriented to person, place, and time.     Coordination: Coordination normal.  Psychiatric:        Behavior: Behavior normal. Behavior is cooperative.        Thought Content: Thought content normal.        Judgment: Judgment normal.          Patient has been counseled extensively about nutrition and exercise as well as the importance of adherence with medications and regular follow-up. The patient was given clear instructions to go to ER or return to medical center if symptoms don't improve, worsen or new problems develop. The patient verbalized understanding.   Follow-up: Return in about 3 months (around 12/17/2024).   Haze LELON Servant, FNP-BC [facility] [place], [place] [phone]   09/08/2024, 3:17 PM

## 2024-09-10 ENCOUNTER — Ambulatory Visit: Admitting: Nurse Practitioner

## 2024-09-21 DIAGNOSIS — M542 Cervicalgia: Secondary | ICD-10-CM | POA: Diagnosis not present

## 2024-09-21 DIAGNOSIS — G43719 Chronic migraine without aura, intractable, without status migrainosus: Secondary | ICD-10-CM | POA: Diagnosis not present

## 2024-09-23 ENCOUNTER — Other Ambulatory Visit: Payer: Self-pay | Admitting: Gastroenterology

## 2024-09-23 DIAGNOSIS — K7460 Unspecified cirrhosis of liver: Secondary | ICD-10-CM

## 2024-10-06 ENCOUNTER — Ambulatory Visit
Admission: RE | Admit: 2024-10-06 | Discharge: 2024-10-06 | Disposition: A | Source: Ambulatory Visit | Attending: Gastroenterology | Admitting: Gastroenterology

## 2024-10-06 DIAGNOSIS — K7460 Unspecified cirrhosis of liver: Secondary | ICD-10-CM

## 2024-10-06 DIAGNOSIS — K8020 Calculus of gallbladder without cholecystitis without obstruction: Secondary | ICD-10-CM | POA: Diagnosis not present

## 2024-10-12 ENCOUNTER — Other Ambulatory Visit: Payer: Self-pay | Admitting: Nurse Practitioner

## 2024-10-12 DIAGNOSIS — N951 Menopausal and female climacteric states: Secondary | ICD-10-CM

## 2024-10-17 ENCOUNTER — Other Ambulatory Visit: Payer: Self-pay | Admitting: Nurse Practitioner

## 2024-10-17 DIAGNOSIS — E119 Type 2 diabetes mellitus without complications: Secondary | ICD-10-CM

## 2024-10-26 ENCOUNTER — Encounter: Payer: Self-pay | Admitting: Cardiology

## 2024-10-30 ENCOUNTER — Encounter: Payer: Self-pay | Admitting: Cardiology

## 2024-10-31 ENCOUNTER — Other Ambulatory Visit: Payer: Self-pay | Admitting: Nurse Practitioner

## 2024-10-31 DIAGNOSIS — D696 Thrombocytopenia, unspecified: Secondary | ICD-10-CM

## 2024-11-11 ENCOUNTER — Other Ambulatory Visit: Payer: Self-pay | Admitting: Nurse Practitioner

## 2024-11-11 DIAGNOSIS — E118 Type 2 diabetes mellitus with unspecified complications: Secondary | ICD-10-CM

## 2024-11-11 NOTE — Telephone Encounter (Signed)
 Requested Prescriptions  Pending Prescriptions Disp Refills   metFORMIN  (GLUCOPHAGE ) 500 MG tablet [Pharmacy Med Name: METFORMIN  500MG  TABLETS] 180 tablet 1    Sig: TAKE 1 TABLET(500 MG) BY MOUTH TWICE DAILY WITH A MEAL     Endocrinology:  Diabetes - Biguanides Passed - 11/11/2024  3:49 PM      Passed - Cr in normal range and within 360 days    Creat  Date Value Ref Range Status  06/21/2016 0.68 0.50 - 1.10 mg/dL Final   Creatinine, Ser  Date Value Ref Range Status  08/20/2024 0.67 0.44 - 1.00 mg/dL Final         Passed - HBA1C is between 0 and 7.9 and within 180 days    HbA1c, POC (controlled diabetic range)  Date Value Ref Range Status  06/09/2024 5.3 0.0 - 7.0 % Final         Passed - eGFR in normal range and within 360 days    GFR, Est African American  Date Value Ref Range Status  06/21/2016 >89 >=60 mL/min Final   GFR calc Af Amer  Date Value Ref Range Status  11/07/2020 125 >59 mL/min/1.73 Final    Comment:    **In accordance with recommendations from the NKF-ASN Task force,**   [facility] is in the process of updating its eGFR calculation to the   2021 CKD-EPI creatinine equation that estimates kidney function   without a race variable.    GFR, Est Non African American  Date Value Ref Range Status  06/21/2016 >89 >=60 mL/min Final   GFR, Estimated  Date Value Ref Range Status  08/20/2024 >60 >60 mL/min Final    Comment:    (NOTE) Calculated using the CKD-EPI Creatinine Equation (2021)    eGFR  Date Value Ref Range Status  03/09/2024 108 >59 mL/min/1.73 Final         Passed - B12 Level in normal range and within 720 days    Vitamin B-12  Date Value Ref Range Status  06/14/2024 255 232 - 1,245 pg/mL Final         Passed - Valid encounter within last 6 months    Recent Outpatient Visits           2 months ago Acute bacterial sinusitis   [facility] Of [place]. [facility] [place], [place] W, NP   5 months ago  Type 2 diabetes mellitus with unspecified complications [facility])   [facility] Dept Of [place]. [facility] Theotis Haze ORN, NP   8 months ago Primary hypertension   [facility] - [facility] Dept Of [place]. [facility] Theotis Haze ORN, NP   11 months ago Type 2 diabetes mellitus with unspecified complications [facility])   [facility] - A Dept Of [place]. [facility] Theotis Haze ORN, NP   1 year ago Primary hypertension   [facility] - A Dept Of [place]. [facility] Theotis Haze ORN, NP       Future Appointments             In 1 week Kate Lonni CROME, MD [facility] A Dept of [facility] [place]. [facility], H&V            Passed - CBC within normal limits and completed in the last 12 months  WBC  Date Value Ref Range Status  08/20/2024 5.1 4.0 - 10.5 K/uL Final   RBC  Date Value Ref Range Status  08/20/2024 4.59 3.87 - 5.11 MIL/uL Final   Hemoglobin  Date Value Ref Range Status  08/20/2024 13.4 12.0 - 15.0 g/dL Final  91/71/7974 87.3 12.0 - 15.0 g/dL Final  97/94/7974 86.0 11.1 - 15.9 g/dL Final   HCT  Date Value Ref Range Status  08/20/2024 40.6 36.0 - 46.0 % Final   Hematocrit  Date Value Ref Range Status  12/10/2023 42.9 34.0 - 46.6 % Final   MCHC  Date Value Ref Range Status  08/20/2024 33.0 30.0 - 36.0 g/dL Final   [facility]  Date Value Ref Range Status  08/20/2024 29.2 26.0 - 34.0 pg Final   MCV  Date Value Ref Range Status  08/20/2024 88.5 80.0 - 100.0 fL Final  12/10/2023 91 79 - 97 fL Final   No results found for: PLTCOUNTKUC, LABPLAT, POCPLA RDW  Date Value Ref Range Status  08/20/2024 13.6 11.5 - 15.5 % Final  12/10/2023 12.6 11.7 - 15.4 % Final

## 2024-11-13 ENCOUNTER — Encounter: Payer: Self-pay | Admitting: Nurse Practitioner

## 2024-11-14 ENCOUNTER — Encounter: Payer: Self-pay | Admitting: Nurse Practitioner

## 2024-11-15 ENCOUNTER — Other Ambulatory Visit: Payer: Self-pay | Admitting: Nurse Practitioner

## 2024-11-15 DIAGNOSIS — R232 Flushing: Secondary | ICD-10-CM

## 2024-11-15 DIAGNOSIS — M79641 Pain in right hand: Secondary | ICD-10-CM

## 2024-11-15 DIAGNOSIS — N951 Menopausal and female climacteric states: Secondary | ICD-10-CM

## 2024-11-23 ENCOUNTER — Encounter: Payer: Self-pay | Admitting: Cardiology

## 2024-11-23 ENCOUNTER — Ambulatory Visit: Admitting: Cardiology

## 2024-11-23 VITALS — BP 118/76 | HR 68 | Ht 60.5 in | Wt 179.0 lb

## 2024-11-23 DIAGNOSIS — E785 Hyperlipidemia, unspecified: Secondary | ICD-10-CM | POA: Diagnosis not present

## 2024-11-23 DIAGNOSIS — I10 Essential (primary) hypertension: Secondary | ICD-10-CM | POA: Diagnosis not present

## 2024-11-23 DIAGNOSIS — I2510 Atherosclerotic heart disease of native coronary artery without angina pectoris: Secondary | ICD-10-CM

## 2024-11-23 NOTE — Progress Notes (Signed)
 " Cardiology Office Note:    Date:  11/23/2024   ID:  Angela Castillo, DOB 06/27/1976, MRN [id number]  PCP:  Theotis Haze ORN, NP  Cardiologist:  Lonni LITTIE Nanas, MD  Electrophysiologist:  None   Referring MD: Theotis Haze ORN, NP   Chief Complaint  Patient presents with   Coronary Artery Disease    History of Present Illness:    Angela Castillo is a 49 y.o. female with a hx of T2DM, hypertension, cirrhosis who presents for follow-up.  She was referred by Haze Theotis, NP for evaluation of chest pain, initially seen 07/14/2023.  Seen in ED on 7/1 for chest pain.  Workup unremarkable.  She reports having chest pain few times per week.  She reports pain across middle of chest, describes as chest pressure, last for hours.  Occurs at rest.  Reports she does not exercise.  Reports occasional shortness of breath and some lightheadedness but denies any syncope.  No lower extremity edema.  No smoking history.  No family history of heart disease.  Coronary CTA 07/28/2023 showed minimal nonobstructive CAD, calcium  score 31 (98th percentile), artifact which precluded full examination of distal LAD and LCx.  Echocardiogram 07/30/2023 showed EF 60 to 65%, normal RV function, no significant valvular disease.  Since last clinic visit, she reports she is doing well.  Reports chest pain has improved, not happening often.  Denies any dyspnea, lower extremity edema, or palpitations.  Does report some lightheadedness denies any syncope.  Reports has lost over 30 pounds over the last 1.5 years.  She walks her dog for exercise.  Wt Readings from Last 3 Encounters:  11/23/24 179 lb (81.2 kg)  09/08/24 179 lb (81.2 kg)  07/15/24 183 lb 6.4 oz (83.2 kg)     Past Medical History:  Diagnosis Date   Anemia Dx January 03 2015   Arthritis Aug 04 2002   Cant fully remember dates   Diabetes mellitus without complication (HCC)    Elevated liver enzymes    Essential hypertension Dx Apr 2016    Fatty liver    GERD (gastroesophageal reflux disease)    Again cant remember date   H/O: upper GI bleed 03/2019    variceal bleed    Headache    History of blood transfusion 03/2019   Liver cirrhosis secondary to NASH (HCC)    Neuropathy    Pneumonia 03/2019   Reflux    Thrombocytopenia     Past Surgical History:  Procedure Laterality Date   DILITATION & CURRETTAGE/HYSTROSCOPY WITH HYDROTHERMAL ABLATION N/A 11/20/2021   Procedure: DILATATION & CURETTAGE/HYSTEROSCOPY WITH HYDROTHERMAL ABLATION;  Surgeon: Fredirick Glenys RAMAN, MD;  Location: [facility];  Service: Gynecology;  Laterality: N/A;  rep will be here confirmed on 11/12/21 CS   ESOPHAGEAL BANDING  03/22/2019   Procedure: ESOPHAGEAL BANDING;  Surgeon: Dianna Specking, MD;  Location: [facility];  Service: Endoscopy;;   ESOPHAGEAL BANDING  03/23/2019   Procedure: ESOPHAGEAL BANDING;  Surgeon: Dianna Specking, MD;  Location: [facility];  Service: Endoscopy;;   ESOPHAGEAL BANDING  09/20/2019   Procedure: ESOPHAGEAL BANDING;  Surgeon: Dianna Specking, MD;  Location: [facility];  Service: Endoscopy;;   ESOPHAGEAL BANDING N/A 12/21/2019   Procedure: ESOPHAGEAL BANDING;  Surgeon: Dianna Specking, MD;  Location: [facility];  Service: Endoscopy;  Laterality: N/A;   ESOPHAGOGASTRODUODENOSCOPY N/A 03/22/2019   Procedure: ESOPHAGOGASTRODUODENOSCOPY (EGD);  Surgeon: Dianna Specking, MD;  Location: [facility];  Service: Endoscopy;  Laterality: N/A;   ESOPHAGOGASTRODUODENOSCOPY N/A 03/23/2019  Procedure: ESOPHAGOGASTRODUODENOSCOPY (EGD);  Surgeon: Dianna Specking, MD;  Location: [facility];  Service: Endoscopy;  Laterality: N/A;   ESOPHAGOGASTRODUODENOSCOPY (EGD) WITH PROPOFOL  N/A 07/23/2019   Procedure: ESOPHAGOGASTRODUODENOSCOPY (EGD) WITH PROPOFOL  with possible banding;  Surgeon: Dianna Specking, MD;  Location: [facility];  Service: Endoscopy;  Laterality: N/A;   ESOPHAGOGASTRODUODENOSCOPY (EGD) WITH PROPOFOL  N/A 09/20/2019    Procedure: ESOPHAGOGASTRODUODENOSCOPY (EGD) WITH PROPOFOL ;  Surgeon: Dianna Specking, MD;  Location: [facility];  Service: Endoscopy;  Laterality: N/A;   ESOPHAGOGASTRODUODENOSCOPY (EGD) WITH PROPOFOL  N/A 12/21/2019   Procedure: ESOPHAGOGASTRODUODENOSCOPY (EGD) WITH PROPOFOL  with possible banding;  Surgeon: Dianna Specking, MD;  Location: [facility];  Service: Endoscopy;  Laterality: N/A;   HERNIA REPAIR     TUBAL LIGATION  07/25/2000   VENTRAL HERNIA REPAIR N/A 07/06/2020   Procedure: VENTRAL HERNIA REPAIR WITH MESH;  Surgeon: Vernetta Berg, MD;  Location: [facility];  Service: General;  Laterality: N/A;    Current Medications: Current Meds  Medication Sig   Accu-Chek Softclix Lancets lancets USE TO CHECK BLOOD SUGAR THREE TIMES DAILY   acetaminophen  (TYLENOL ) 500 MG tablet Take 500-1,000 mg by mouth every 6 (six) hours as needed for mild pain.   albuterol  (VENTOLIN  HFA) 108 (90 Base) MCG/ACT inhaler Inhale 2 puffs into the lungs every 6 (six) hours as needed for wheezing or shortness of breath.   Ascorbic Acid (VITAMIN C) 100 MG tablet Take 100 mg by mouth daily.   Biotin 89999 MCG TABS Take 10,000 mcg by mouth daily.   Blood Glucose Monitoring Suppl (ACCU-CHEK GUIDE) w/Device KIT Use to check blood sugar three times daily.   diclofenac  Sodium (VOLTAREN ) 1 % GEL Apply 4 g topically 4 (four) times daily. (Patient taking differently: Apply 4 g topically 4 (four) times daily as needed.)   esomeprazole  (NEXIUM ) 40 MG capsule Take 40 mg by mouth 2 (two) times daily before a meal.   FARXIGA  5 MG TABS tablet TAKE 1 TABLET(5 MG) BY MOUTH DAILY   FEROSUL 325 (65 Fe) MG tablet TAKE 1 TABLET(325 MG) BY MOUTH TWICE DAILY WITH A MEAL   fluticasone  (FLONASE ) 50 MCG/ACT nasal spray Place 2 sprays into both nostrils daily. (Patient taking differently: Place 2 sprays into both nostrils daily as needed.)   glucose blood (ACCU-CHEK GUIDE) test strip USE TO CHECK BLOOD SUGAR THREE TIMES DAILY    icosapent  Ethyl (VASCEPA ) 1 g capsule Take 2 capsules (2 g total) by mouth 2 (two) times daily.   ketoconazole  (NIZORAL ) 2 % cream Apply 1 application  topically daily as needed for irritation (as needed).   losartan  (COZAAR ) 50 MG tablet TAKE 1 TABLET(50 MG) BY MOUTH DAILY   meclizine  (ANTIVERT ) 25 MG tablet Take 1-2 tablets (25-50 mg total) by mouth 2 (two) times daily. (Patient taking differently: Take 25-50 mg by mouth 2 (two) times daily as needed.)   Menthol, Topical Analgesic, (ICY HOT MEDICATED SPRAY) 16 % LIQD Apply 1 spray topically daily as needed (pain).   metFORMIN  (GLUCOPHAGE ) 500 MG tablet TAKE 1 TABLET(500 MG) BY MOUTH TWICE DAILY WITH A MEAL   methocarbamol  (ROBAXIN ) 500 MG tablet Take 1 tablet (500 mg total) by mouth every 8 (eight) hours as needed for muscle spasms.   Microlet Lancets MISC USE UP TO FOUR TIMES DAILY AS DIRECTED   mupirocin  ointment (BACTROBAN ) 2 % APPLY TOPICALLY TO THE AFFECTED AREA TWICE DAILY   ondansetron  (ZOFRAN -ODT) 4 MG disintegrating tablet DISSOLVE 1 TABLET(4 MG) ON THE TONGUE EVERY 8 HOURS AS NEEDED FOR VOMITING OR NAUSEA  OZEMPIC , 0.25 OR 0.5 MG/DOSE, 2 MG/3ML SOPN INJECT 0.5MG  UNDER THE SKIN ONCE A WEEK   propranolol  (INDERAL ) 20 MG tablet Take 1 tablet (20 mg total) by mouth 2 (two) times daily.   rosuvastatin  (CRESTOR ) 10 MG tablet Take 1 tablet (10 mg total) by mouth daily.   VEOZAH  45 MG TABS TAKE 1 TABLET(45 MG) BY MOUTH DAILY   zonisamide (ZONEGRAN) 50 MG capsule Take 150 mg by mouth daily.     Allergies:   Nsaids   Social History   Socioeconomic History   Marital status: Married    Spouse name: Not on file   Number of children: 2   Years of education: Not on file   Highest education level: 12th grade  Occupational History    Comment: security guard at [employer]  Tobacco Use   Smoking status: Never   Smokeless tobacco: Never  Vaping Use   Vaping status: Never Used  Substance and Sexual Activity   Alcohol use: Not  Currently    Comment: rarely-last alcohol drank in 08/2018   Drug use: No   Sexual activity: Not Currently    Birth control/protection: Surgical    Comment: BTL in 2021  Other Topics Concern   Not on file  Social History Narrative   Not on file   Social Drivers of Health   Tobacco Use: Low Risk (11/23/2024)   Patient History    Smoking Tobacco Use: Never    Smokeless Tobacco Use: Never    Passive Exposure: Not on file  Financial Resource Strain: Low Risk (09/06/2024)   Overall Financial Resource Strain (CARDIA)    Difficulty of Paying Living Expenses: Not hard at all  Food Insecurity: No Food Insecurity (09/06/2024)   Epic    Worried About Programme Researcher, Broadcasting/film/video in the Last Year: Never true    Ran Out of Food in the Last Year: Never true  Transportation Needs: No Transportation Needs (09/06/2024)   Epic    Lack of Transportation (Medical): No    Lack of Transportation (Non-Medical): No  Physical Activity: Inactive (09/06/2024)   Exercise Vital Sign    Days of Exercise per Week: 0 days    Minutes of Exercise per Session: Not on file  Stress: Stress Concern Present (09/06/2024)   [employer] of Occupational Health - Occupational Stress Questionnaire    Feeling of Stress: To some extent  Social Connections: Socially Isolated (09/06/2024)   Social Connection and Isolation Panel    Frequency of Communication with Friends and Family: Once a week    Frequency of Social Gatherings with Friends and Family: Once a week    Attends Religious Services: Never    Database Administrator or Organizations: No    Attends Engineer, Structural: Not on file    Marital Status: Married  Depression (PHQ2-9): Low Risk (09/08/2024)   Depression (PHQ2-9)    PHQ-2 Score: 1  Alcohol Screen: Low Risk (03/03/2023)   Alcohol Screen    Last Alcohol Screening Score (AUDIT): 0  Housing: Unknown (09/06/2024)   Epic    Unable to Pay for Housing in the Last Year: No    Number of Times Moved in the  Last Year: Not on file    Homeless in the Last Year: No  Utilities: Not on file  Health Literacy: Adequate Health Literacy (03/09/2024)   B1300 Health Literacy    Frequency of need for help with medical instructions: Never     Family History: The  patient's family history includes ADD / ADHD in her son and son; Breast cancer in her maternal aunt; COPD in her mother; Cancer in her maternal aunt, maternal grandmother, and mother; Diabetes in her father and mother; Esophageal cancer in her mother; Hypertension in her mother.  ROS:   Please see the history of present illness.     All other systems reviewed and are negative.  EKGs/Labs/Other Studies Reviewed:    The following studies were reviewed today:   EKG:   07/14/2023: Normal sinus rhythm, poor R wave progression, rate 65  Recent Labs: 03/09/2024: ALT 24 06/14/2024: TSH 2.650 08/20/2024: BUN 14; Creatinine, Ser 0.67; Hemoglobin 13.4; Platelets 68; Potassium 4.4; Sodium 144  Recent Lipid Panel    Component Value Date/Time   CHOL 120 07/15/2023 1357   TRIG 121 07/15/2023 1357   HDL 33 (L) 07/15/2023 1357   CHOLHDL 3.6 07/15/2023 1357   CHOLHDL 8.0 (H) 06/12/2015 1756   VLDL 28 06/12/2015 1756   LDLCALC 65 07/15/2023 1357    Physical Exam:    VS:  BP 118/76   Pulse 68   Ht 5' 0.5 (1.537 m)   Wt 179 lb (81.2 kg)   LMP 10/24/2021   SpO2 98%   BMI 34.38 kg/m     Wt Readings from Last 3 Encounters:  11/23/24 179 lb (81.2 kg)  09/08/24 179 lb (81.2 kg)  07/15/24 183 lb 6.4 oz (83.2 kg)     GEN:  Well nourished, well developed in no acute distress HEENT: Normal NECK: No JVD; No carotid bruits CARDIAC: RRR, no murmurs, rubs, gallops RESPIRATORY:  Clear to auscultation without rales, wheezing or rhonchi  ABDOMEN: Soft, non-tender, non-distended MUSCULOSKELETAL:  No edema; No deformity  SKIN: Warm and dry NEUROLOGIC:  Alert and oriented x 3 PSYCHIATRIC:  Normal affect   ASSESSMENT:    1. Coronary artery disease  involving native coronary artery of native heart without angina pectoris   2. Essential hypertension   3. Hyperlipidemia, unspecified hyperlipidemia type      PLAN:    CAD: Reported atypical chest pain.  Coronary CTA 07/28/2023 showed minimal nonobstructive CAD, calcium  score 31 (98th percentile), artifact which precluded full examination of distal LAD and LCx.  Echocardiogram 07/30/2023 showed EF 60 to 65%, normal RV function, no significant valvular disease. -Continue rosuvastatin  10 mg daily  Hypertension: On losartan  50 mg daily, propranolol  20 mg twice daily.  Appears controlled  Hyperlipidemia: On rosuvastatin  10 mg daily.  LDL 65 on 07/15/2023.  Reports has upcoming labs with PCP  T2DM: On Farxiga  and metformin .  Last A1c 5.8% on 06/09/23  Cirrhosis: follows with [facility] in 1 year  Medication Adjustments/Labs and Tests Ordered: Current medicines are reviewed at length with the patient today.  Concerns regarding medicines are outlined above.  No orders of the defined types were placed in this encounter.  No orders of the defined types were placed in this encounter.   Patient Instructions  Medication Instructions:  Your physician recommends that you continue on your current medications as directed. Please refer to the Current Medication list given to you today.  *If you need a refill on your cardiac medications before your next appointment, please call your pharmacy*   Follow-Up: At [facility], you and your health needs are our priority.  As part of our continuing mission to provide you with exceptional heart care, our providers are all part of one team.  This team includes your primary Cardiologist (physician) and  Advanced Practice Providers or APPs (Physician Assistants and Nurse Practitioners) who all work together to provide you with the care you need, when you need it.  Your next appointment:   1 year(s)  Provider:   Lonni LITTIE Nanas, MD                 Signed, Lonni LITTIE Nanas, MD  11/23/2024 5:17 PM    [facility] "

## 2024-11-23 NOTE — Patient Instructions (Signed)
 Medication Instructions:  Your physician recommends that you continue on your current medications as directed. Please refer to the Current Medication list given to you today.  *If you need a refill on your cardiac medications before your next appointment, please call your pharmacy*   Follow-Up: At [facility], you and your health needs are our priority.  As part of our continuing mission to provide you with exceptional heart care, our providers are all part of one team.  This team includes your primary Cardiologist (physician) and Advanced Practice Providers or APPs (Physician Assistants and Nurse Practitioners) who all work together to provide you with the care you need, when you need it.  Your next appointment:   1 year(s)  Provider:   Lonni LITTIE Nanas, MD

## 2024-11-24 ENCOUNTER — Telehealth: Payer: Self-pay | Admitting: Nurse Practitioner

## 2024-11-24 NOTE — Telephone Encounter (Signed)
 Pt confirmed appt

## 2024-11-26 ENCOUNTER — Encounter: Payer: Self-pay | Admitting: Nurse Practitioner

## 2024-11-26 ENCOUNTER — Encounter: Payer: Self-pay | Admitting: Cardiology

## 2024-11-26 ENCOUNTER — Ambulatory Visit: Attending: Nurse Practitioner | Admitting: Nurse Practitioner

## 2024-11-26 VITALS — BP 117/78 | HR 67 | Ht 60.5 in | Wt 179.6 lb

## 2024-11-26 DIAGNOSIS — E118 Type 2 diabetes mellitus with unspecified complications: Secondary | ICD-10-CM

## 2024-11-26 DIAGNOSIS — E119 Type 2 diabetes mellitus without complications: Secondary | ICD-10-CM | POA: Diagnosis not present

## 2024-11-26 DIAGNOSIS — Z7984 Long term (current) use of oral hypoglycemic drugs: Secondary | ICD-10-CM

## 2024-11-26 DIAGNOSIS — E785 Hyperlipidemia, unspecified: Secondary | ICD-10-CM

## 2024-11-26 DIAGNOSIS — I10 Essential (primary) hypertension: Secondary | ICD-10-CM

## 2024-11-26 MED ORDER — ROSUVASTATIN CALCIUM 10 MG PO TABS: 10.0000 mg | ORAL_TABLET | Freq: Every day | ORAL | 3 refills | Status: AC

## 2024-11-26 NOTE — Progress Notes (Signed)
 "  Assessment & Plan:  Angela Castillo was seen today for medical management of chronic issues.  Diagnoses and all orders for this visit:  Primary hypertension -     CMP14+EGFR Continue losartan  as prescribed.  Reminded to bring in blood pressure log for follow  up appointment.  RECOMMENDATIONS: DASH/Mediterranean Diets are healthier choices for HTN.    Dyslipidemia, goal LDL below 70 -     Lipid panel -     rosuvastatin  (CRESTOR ) 10 MG tablet; Take 1 tablet (10 mg total) by mouth daily. INSTRUCTIONS: Work on a low fat, heart healthy diet and participate in regular aerobic exercise program by working out at least 150 minutes per week; 5 days a week-30 minutes per day. Avoid red meat/beef/steak,  fried foods. junk foods, sodas, sugary drinks, unhealthy snacking, alcohol and smoking.  Drink at least 80 oz of water  per day and monitor your carbohydrate intake daily.    Type 2 diabetes mellitus with unspecified complications (HCC) -     Hemoglobin A1c    Patient has been counseled on age-appropriate routine health concerns for screening and prevention. These are reviewed and up-to-date. Referrals have been placed accordingly. Immunizations are up-to-date or declined.    Subjective:   Chief Complaint  Patient presents with   Medical Management of Chronic Issues    Angela Castillo 49 y.o. female presents to office today for follow up to HTN   She is experiencing right hand pain. Stating Ive had some mild to moderate pain in my right wrist for going on maybe 2 months or more. Lost track. The pain started in the morning then would eventually go away. But now its seems to be all the time. As if Ive hit it. Ive done no such thing. Nor fallen. Just below my thumb and runs into my wrist line. Im icing it and trying to use my heating pad as I can.  Today she is wearing a right hand brace. Notes swelling and pain in the area between the right thumb and right ring finger. She has an  upcoming appointment with the hand specialist.    HTN Blood pressure is well controlled. She is taking losartan  50 mg daily.  BP Readings from Last 3 Encounters:  11/26/24 117/78  11/23/24 118/76  09/08/24 130/76     HPL She has been waiting on her rosuvastatin  prescription to be filled. I instructed her that I had not received a message from the pharmacy however will go ahead and refill today.  She is also taking vascepa . Lab Results  Component Value Date   CHOL 120 07/15/2023   CHOL 125 07/26/2022   CHOL 183 12/05/2021   Lab Results  Component Value Date   HDL 33 (L) 07/15/2023   HDL 34 (L) 07/26/2022   HDL 24 (L) 12/05/2021   Lab Results  Component Value Date   LDLCALC 65 07/15/2023   LDLCALC 73 07/26/2022   LDLCALC 104 (H) 12/05/2021   Lab Results  Component Value Date   TRIG 121 07/15/2023   TRIG 95 07/26/2022   TRIG 321 (H) 12/05/2021   Lab Results  Component Value Date   CHOLHDL 3.6 07/15/2023   CHOLHDL 3.7 07/26/2022   CHOLHDL 7.6 (H) 12/05/2021   No results found for: LDLDIRECT     Review of Systems  Constitutional:  Negative for fever, malaise/fatigue and weight loss.  HENT: Negative.  Negative for nosebleeds.   Eyes: Negative.  Negative for blurred vision, double vision and photophobia.  Respiratory: Negative.  Negative for cough and shortness of breath.   Cardiovascular: Negative.  Negative for chest pain, palpitations and leg swelling.  Gastrointestinal: Negative.  Negative for heartburn, nausea and vomiting.  Musculoskeletal: Negative.  Negative for myalgias.  Neurological: Negative.  Negative for dizziness, focal weakness, seizures and headaches.  Psychiatric/Behavioral: Negative.  Negative for suicidal ideas.     Past Medical History:  Diagnosis Date   Anemia Dx January 03 2015   Arthritis Aug 04 2002   Cant fully remember dates   Diabetes mellitus without complication (HCC)    Elevated liver enzymes    Essential hypertension Dx Apr  2016   Fatty liver    GERD (gastroesophageal reflux disease)    Again cant remember date   H/O: upper GI bleed 03/2019    variceal bleed    Headache    History of blood transfusion 03/2019   Liver cirrhosis secondary to NASH (HCC)    Neuropathy    Pneumonia 03/2019   Reflux    Thrombocytopenia     Past Surgical History:  Procedure Laterality Date   DILITATION & CURRETTAGE/HYSTROSCOPY WITH HYDROTHERMAL ABLATION N/A 11/20/2021   Procedure: DILATATION & CURETTAGE/HYSTEROSCOPY WITH HYDROTHERMAL ABLATION;  Surgeon: Angela Glenys RAMAN, MD;  Location: [facility] OR;  Service: Gynecology;  Laterality: N/A;  rep will be here confirmed on 11/12/21 CS   ESOPHAGEAL BANDING  03/22/2019   Procedure: ESOPHAGEAL BANDING;  Surgeon: Angela Specking, MD;  Location: [facility];  Service: Endoscopy;;   ESOPHAGEAL BANDING  03/23/2019   Procedure: ESOPHAGEAL BANDING;  Surgeon: Angela Specking, MD;  Location: [facility];  Service: Endoscopy;;   ESOPHAGEAL BANDING  09/20/2019   Procedure: ESOPHAGEAL BANDING;  Surgeon: Angela Specking, MD;  Location: [facility];  Service: Endoscopy;;   ESOPHAGEAL BANDING N/A 12/21/2019   Procedure: ESOPHAGEAL BANDING;  Surgeon: Angela Specking, MD;  Location: [facility];  Service: Endoscopy;  Laterality: N/A;   ESOPHAGOGASTRODUODENOSCOPY N/A 03/22/2019   Procedure: ESOPHAGOGASTRODUODENOSCOPY (EGD);  Surgeon: Angela Specking, MD;  Location: [facility];  Service: Endoscopy;  Laterality: N/A;   ESOPHAGOGASTRODUODENOSCOPY N/A 03/23/2019   Procedure: ESOPHAGOGASTRODUODENOSCOPY (EGD);  Surgeon: Angela Specking, MD;  Location: [facility];  Service: Endoscopy;  Laterality: N/A;   ESOPHAGOGASTRODUODENOSCOPY (EGD) WITH PROPOFOL  N/A 07/23/2019   Procedure: ESOPHAGOGASTRODUODENOSCOPY (EGD) WITH PROPOFOL  with possible banding;  Surgeon: Angela Specking, MD;  Location: [facility];  Service: Endoscopy;  Laterality: N/A;   ESOPHAGOGASTRODUODENOSCOPY (EGD) WITH PROPOFOL  N/A  09/20/2019   Procedure: ESOPHAGOGASTRODUODENOSCOPY (EGD) WITH PROPOFOL ;  Surgeon: Angela Specking, MD;  Location: [facility];  Service: Endoscopy;  Laterality: N/A;   ESOPHAGOGASTRODUODENOSCOPY (EGD) WITH PROPOFOL  N/A 12/21/2019   Procedure: ESOPHAGOGASTRODUODENOSCOPY (EGD) WITH PROPOFOL  with possible banding;  Surgeon: Angela Specking, MD;  Location: [facility];  Service: Endoscopy;  Laterality: N/A;   HERNIA REPAIR     TUBAL LIGATION  07/25/2000   VENTRAL HERNIA REPAIR N/A 07/06/2020   Procedure: VENTRAL HERNIA REPAIR WITH MESH;  Surgeon: Vernetta Berg, MD;  Location: [facility] OR;  Service: General;  Laterality: N/A;    Family History  Problem Relation Age of Onset   Diabetes Father    COPD Mother    Esophageal cancer Mother    Diabetes Mother    Cancer Mother        esophageal   Hypertension Mother    Breast cancer Maternal Aunt    Cancer Maternal Grandmother    ADD / ADHD Son    ADD / ADHD Son    Cancer Maternal Aunt  Social History Reviewed with no changes to be made today.   Outpatient Medications Prior to Visit  Medication Sig Dispense Refill   Accu-Chek Softclix Lancets lancets USE TO CHECK BLOOD SUGAR THREE TIMES DAILY 300 each 0   acetaminophen  (TYLENOL ) 500 MG tablet Take 500-1,000 mg by mouth every 6 (six) hours as needed for mild pain.     albuterol  (VENTOLIN  HFA) 108 (90 Base) MCG/ACT inhaler Inhale 2 puffs into the lungs every 6 (six) hours as needed for wheezing or shortness of breath. 6.7 g 1   Ascorbic Acid (VITAMIN C) 100 MG tablet Take 100 mg by mouth daily.     Biotin 89999 MCG TABS Take 10,000 mcg by mouth daily.     Blood Glucose Monitoring Suppl (ACCU-CHEK GUIDE) w/Device KIT Use to check blood sugar three times daily. 1 kit 0   diclofenac  Sodium (VOLTAREN ) 1 % GEL Apply 4 g topically 4 (four) times daily. (Patient taking differently: Apply 4 g topically 4 (four) times daily as needed.) 200 g 1   esomeprazole  (NEXIUM ) 40 MG capsule Take 40 mg by  mouth 2 (two) times daily before a meal.     FARXIGA  5 MG TABS tablet TAKE 1 TABLET(5 MG) BY MOUTH DAILY 90 tablet 1   FEROSUL 325 (65 Fe) MG tablet TAKE 1 TABLET(325 MG) BY MOUTH TWICE DAILY WITH A MEAL 180 tablet 2   fluticasone  (FLONASE ) 50 MCG/ACT nasal spray Place 2 sprays into both nostrils daily. (Patient taking differently: Place 2 sprays into both nostrils daily as needed.) 16 g 6   glucose blood (ACCU-CHEK GUIDE) test strip USE TO CHECK BLOOD SUGAR THREE TIMES DAILY 300 strip 0   icosapent  Ethyl (VASCEPA ) 1 g capsule Take 2 capsules (2 g total) by mouth 2 (two) times daily. 180 capsule 1   ketoconazole  (NIZORAL ) 2 % cream Apply 1 application  topically daily as needed for irritation (as needed).     losartan  (COZAAR ) 50 MG tablet TAKE 1 TABLET(50 MG) BY MOUTH DAILY 90 tablet 3   meclizine  (ANTIVERT ) 25 MG tablet Take 1-2 tablets (25-50 mg total) by mouth 2 (two) times daily. (Patient taking differently: Take 25-50 mg by mouth 2 (two) times daily as needed.) 60 tablet 0   Menthol, Topical Analgesic, (ICY HOT MEDICATED SPRAY) 16 % LIQD Apply 1 spray topically daily as needed (pain).     metFORMIN  (GLUCOPHAGE ) 500 MG tablet TAKE 1 TABLET(500 MG) BY MOUTH TWICE DAILY WITH A MEAL 180 tablet 1   methocarbamol  (ROBAXIN ) 500 MG tablet Take 1 tablet (500 mg total) by mouth every 8 (eight) hours as needed for muscle spasms. 60 tablet 3   Microlet Lancets MISC USE UP TO FOUR TIMES DAILY AS DIRECTED 100 each 11   mupirocin  ointment (BACTROBAN ) 2 % APPLY TOPICALLY TO THE AFFECTED AREA TWICE DAILY 22 g 0   ondansetron  (ZOFRAN -ODT) 4 MG disintegrating tablet DISSOLVE 1 TABLET(4 MG) ON THE TONGUE EVERY 8 HOURS AS NEEDED FOR VOMITING OR NAUSEA 30 tablet 1   OZEMPIC , 0.25 OR 0.5 MG/DOSE, 2 MG/3ML SOPN INJECT 0.5MG  UNDER THE SKIN ONCE A WEEK 9 mL 1   propranolol  (INDERAL ) 20 MG tablet Take 1 tablet (20 mg total) by mouth 2 (two) times daily. 180 tablet 1   VEOZAH  45 MG TABS TAKE 1 TABLET(45 MG) BY MOUTH  DAILY 30 tablet 1   zonisamide (ZONEGRAN) 50 MG capsule Take 150 mg by mouth daily.     rosuvastatin  (CRESTOR ) 10 MG tablet Take 1 tablet (  10 mg total) by mouth daily. 90 tablet 1   No facility-administered medications prior to visit.    Allergies[1]     Objective:    BP 117/78 (BP Location: Left Arm, Patient Position: Sitting, Cuff Size: Normal)   Pulse 67   Ht 5' 0.5 (1.537 m)   Wt 179 lb 9.6 oz (81.5 kg)   LMP 10/24/2021   SpO2 98%   BMI 34.50 kg/m  Wt Readings from Last 3 Encounters:  11/26/24 179 lb 9.6 oz (81.5 kg)  11/23/24 179 lb (81.2 kg)  09/08/24 179 lb (81.2 kg)    Physical Exam Vitals and nursing note reviewed.  Constitutional:      Appearance: She is well-developed.  HENT:     Head: Normocephalic and atraumatic.  Cardiovascular:     Rate and Rhythm: Normal rate and regular rhythm.     Heart sounds: Normal heart sounds. No murmur heard.    No friction rub. No gallop.  Pulmonary:     Effort: Pulmonary effort is normal. No tachypnea or respiratory distress.     Breath sounds: Normal breath sounds. No decreased breath sounds, wheezing, rhonchi or rales.  Chest:     Chest wall: No tenderness.  Musculoskeletal:        General: Normal range of motion.     Cervical back: Normal range of motion.  Skin:    General: Skin is warm and dry.  Neurological:     Mental Status: She is alert and oriented to person, place, and time.     Coordination: Coordination normal.  Psychiatric:        Behavior: Behavior normal. Behavior is cooperative.        Thought Content: Thought content normal.        Judgment: Judgment normal.          Patient has been counseled extensively about nutrition and exercise as well as the importance of adherence with medications and regular follow-up. The patient was given clear instructions to go to ER or return to medical center if symptoms don't improve, worsen or new problems develop. The patient verbalized understanding.    Follow-up: Return in about 4 months (around 03/26/2025).   Haze LELON Servant, FNP-BC [facility] and [facility] [place], [place] [phone]   11/26/2024, 3:12 PM     [1]  Allergies Allergen Reactions   Nsaids     GI BLEED  Other Reaction(s): Due to hx of GI bleed   "

## 2024-11-27 LAB — CMP14+EGFR
ALT: 30 [IU]/L (ref 0–32)
AST: 21 [IU]/L (ref 0–40)
Albumin: 4.4 g/dL (ref 3.9–4.9)
Alkaline Phosphatase: 131 [IU]/L — ABNORMAL HIGH (ref 41–116)
BUN/Creatinine Ratio: 35 — ABNORMAL HIGH (ref 9–23)
BUN: 19 mg/dL (ref 6–24)
Bilirubin Total: 0.5 mg/dL (ref 0.0–1.2)
CO2: 22 mmol/L (ref 20–29)
Calcium: 9.1 mg/dL (ref 8.7–10.2)
Chloride: 108 mmol/L — ABNORMAL HIGH (ref 96–106)
Creatinine, Ser: 0.55 mg/dL — ABNORMAL LOW (ref 0.57–1.00)
Globulin, Total: 3.3 g/dL (ref 1.5–4.5)
Glucose: 86 mg/dL (ref 70–99)
Potassium: 3.9 mmol/L (ref 3.5–5.2)
Sodium: 143 mmol/L (ref 134–144)
Total Protein: 7.7 g/dL (ref 6.0–8.5)
eGFR: 113 mL/min/{1.73_m2}

## 2024-11-27 LAB — LIPID PANEL
Chol/HDL Ratio: 2.9 ratio (ref 0.0–4.4)
Cholesterol, Total: 105 mg/dL (ref 100–199)
HDL: 36 mg/dL — ABNORMAL LOW
LDL Chol Calc (NIH): 50 mg/dL (ref 0–99)
Triglycerides: 99 mg/dL (ref 0–149)
VLDL Cholesterol Cal: 19 mg/dL (ref 5–40)

## 2024-11-27 LAB — HEMOGLOBIN A1C
Est. average glucose Bld gHb Est-mCnc: 103 mg/dL
Hgb A1c MFr Bld: 5.2 % (ref 4.8–5.6)

## 2024-11-28 ENCOUNTER — Ambulatory Visit: Payer: Self-pay | Admitting: Nurse Practitioner

## 2024-12-06 ENCOUNTER — Ambulatory Visit: Admitting: Orthopedic Surgery

## 2024-12-09 ENCOUNTER — Telehealth: Payer: Self-pay | Admitting: Hematology and Oncology

## 2024-12-09 ENCOUNTER — Ambulatory Visit: Admitting: Podiatry

## 2024-12-09 MED ORDER — CEPHALEXIN 500 MG PO CAPS: 500.0000 mg | ORAL_CAPSULE | Freq: Three times a day (TID) | ORAL | 0 refills | Status: AC

## 2024-12-09 NOTE — Patient Instructions (Signed)
 You can use UREA  CREAM on the calluses  -  Soak Instructions    THE DAY AFTER THE PROCEDURE  Place 1/4 cup of epsom salts in a quart of warm tap water .  Submerge your foot or feet with outer bandage intact for the initial soak; this will allow the bandage to become moist and wet for easy lift off.  Once you remove your bandage, continue to soak in the solution for 20 minutes.  This soak should be done twice a day.  Next, remove your foot or feet from solution, blot dry the affected area and cover.  You may use a band aid large enough to cover the area or use gauze and tape.  Apply other medications to the area as directed by the doctor such as polysporin neosporin.  IF YOUR SKIN BECOMES IRRITATED WHILE USING THESE INSTRUCTIONS, IT IS OKAY TO SWITCH TO  WHITE VINEGAR AND WATER . Or you may use antibacterial soap and water  to keep the toe clean  Monitor for any signs/symptoms of infection. Call the office immediately if any occur or go directly to the emergency room. Call with any questions/concerns.

## 2024-12-09 NOTE — Telephone Encounter (Signed)
 I spoke with patient and she is aware of rescheduled lab and MD appointments from 01/03/2025 to 01/17/2025.

## 2024-12-10 ENCOUNTER — Ambulatory Visit: Admitting: Nurse Practitioner

## 2024-12-10 ENCOUNTER — Other Ambulatory Visit: Payer: Self-pay | Admitting: Family Medicine

## 2024-12-20 ENCOUNTER — Ambulatory Visit: Admitting: Orthopedic Surgery

## 2025-01-03 ENCOUNTER — Inpatient Hospital Stay

## 2025-01-03 ENCOUNTER — Inpatient Hospital Stay: Admitting: Hematology and Oncology

## 2025-01-17 ENCOUNTER — Inpatient Hospital Stay

## 2025-01-17 ENCOUNTER — Inpatient Hospital Stay: Admitting: Hematology and Oncology

## 2025-03-29 ENCOUNTER — Ambulatory Visit: Payer: Self-pay | Admitting: Nurse Practitioner
